# Patient Record
Sex: Female | Born: 1965
Health system: Southern US, Community
[De-identification: ages and names within clinical notes are randomized; demographics above are authoritative.]

## PROBLEM LIST (undated history)

## (undated) DIAGNOSIS — Z803 Family history of malignant neoplasm of breast: Secondary | ICD-10-CM

## (undated) DIAGNOSIS — K219 Gastro-esophageal reflux disease without esophagitis: Secondary | ICD-10-CM

## (undated) DIAGNOSIS — Z8041 Family history of malignant neoplasm of ovary: Secondary | ICD-10-CM

## (undated) DIAGNOSIS — Z8051 Family history of malignant neoplasm of kidney: Secondary | ICD-10-CM

## (undated) DIAGNOSIS — N200 Calculus of kidney: Secondary | ICD-10-CM

## (undated) DIAGNOSIS — Z808 Family history of malignant neoplasm of other organs or systems: Secondary | ICD-10-CM

## (undated) DIAGNOSIS — I10 Essential (primary) hypertension: Secondary | ICD-10-CM

## (undated) DIAGNOSIS — E785 Hyperlipidemia, unspecified: Secondary | ICD-10-CM

## (undated) DIAGNOSIS — E119 Type 2 diabetes mellitus without complications: Secondary | ICD-10-CM

## (undated) DIAGNOSIS — B029 Zoster without complications: Secondary | ICD-10-CM

## (undated) HISTORY — PX: KIDNEY STONE SURGERY: SHX686

## (undated) HISTORY — DX: Family history of malignant neoplasm of breast: Z80.3

## (undated) HISTORY — DX: Family history of malignant neoplasm of ovary: Z80.41

## (undated) HISTORY — DX: Family history of malignant neoplasm of other organs or systems: Z80.8

## (undated) HISTORY — DX: Family history of malignant neoplasm of kidney: Z80.51

---
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
## 2009-11-14 ENCOUNTER — Observation Stay (HOSPITAL_COMMUNITY): Admission: AD | Admit: 2009-11-14 | Discharge: 2009-11-14 | Payer: Self-pay

## 2009-11-27 ENCOUNTER — Emergency Department (HOSPITAL_COMMUNITY): Admission: EM | Admit: 2009-11-27 | Discharge: 2009-11-27 | Payer: Self-pay | Admitting: Emergency Medicine

## 2010-12-09 LAB — CBC
Hemoglobin: 14.2 g/dL (ref 12.0–15.0)
Platelets: 188 10*3/uL (ref 150–400)
RBC: 5 MIL/uL (ref 3.87–5.11)
RDW: 13.8 % (ref 11.5–15.5)

## 2010-12-09 LAB — BASIC METABOLIC PANEL
BUN: 14 mg/dL (ref 6–23)
Chloride: 104 mEq/L (ref 96–112)
Creatinine, Ser: 0.65 mg/dL (ref 0.4–1.2)
GFR calc Af Amer: >60 mL/min (ref >60)

## 2010-12-09 LAB — LIPID PANEL
HDL: 58 mg/dL (ref >39)
Total CHOL/HDL Ratio: 3 RATIO
Triglycerides: 78 mg/dL (ref <150)
VLDL: 16 mg/dL (ref 0–40)

## 2010-12-09 LAB — HEPATIC FUNCTION PANEL
AST: 17 U/L (ref 0–37)
Bilirubin, Direct: 0.2 mg/dL (ref 0.0–0.3)
Total Bilirubin: 1.1 mg/dL (ref 0.3–1.2)

## 2010-12-13 LAB — URINALYSIS, ROUTINE W REFLEX MICROSCOPIC: Specific Gravity, Urine: 1.025 (ref 1.005–1.030)

## 2010-12-13 LAB — COMPREHENSIVE METABOLIC PANEL
ALT: 28 U/L (ref 0–35)
CO2: 29 mEq/L (ref 19–32)
Calcium: 9 mg/dL (ref 8.4–10.5)
Chloride: 106 mEq/L (ref 96–112)
Creatinine, Ser: 0.7 mg/dL (ref 0.4–1.2)
GFR calc non Af Amer: >60 mL/min (ref >60)
Glucose, Bld: 127 mg/dL — ABNORMAL HIGH (ref 70–99)
Total Bilirubin: 1.1 mg/dL (ref 0.3–1.2)
Total Protein: 7.4 g/dL (ref 6.0–8.3)

## 2010-12-13 LAB — DIFFERENTIAL
Basophils Relative: 0 % (ref 0–1)
Lymphs Abs: 1.7 10*3/uL (ref 0.7–4.0)
Monocytes Absolute: 0.6 10*3/uL (ref 0.1–1.0)
Monocytes Relative: 11 % (ref 3–12)
Neutrophils Relative %: 56 % (ref 43–77)

## 2010-12-13 LAB — CBC: HCT: 39.3 % (ref 36.0–46.0)

## 2010-12-13 LAB — LIPASE, BLOOD: Lipase: 27 U/L (ref 11–59)

## 2010-12-13 LAB — POCT PREGNANCY, URINE: Preg Test, Ur: NEGATIVE

## 2010-12-13 LAB — URINE MICROSCOPIC-ADD ON

## 2012-08-04 ENCOUNTER — Encounter (HOSPITAL_COMMUNITY): Payer: Self-pay | Admitting: *Deleted

## 2012-08-04 ENCOUNTER — Emergency Department (HOSPITAL_COMMUNITY): Payer: Self-pay

## 2012-08-04 ENCOUNTER — Emergency Department (HOSPITAL_COMMUNITY)
Admission: EM | Admit: 2012-08-04 | Discharge: 2012-08-04 | Disposition: A | Discharge status: Home / Self Care | Payer: Self-pay | Attending: Emergency Medicine | Admitting: Emergency Medicine

## 2012-08-04 DIAGNOSIS — Z87442 Personal history of urinary calculi: Secondary | ICD-10-CM | POA: Insufficient documentation

## 2012-08-04 DIAGNOSIS — Z8679 Personal history of other diseases of the circulatory system: Secondary | ICD-10-CM

## 2012-08-04 DIAGNOSIS — R109 Unspecified abdominal pain: Secondary | ICD-10-CM | POA: Insufficient documentation

## 2012-08-04 DIAGNOSIS — I10 Essential (primary) hypertension: Secondary | ICD-10-CM | POA: Insufficient documentation

## 2012-08-04 DIAGNOSIS — R12 Heartburn: Secondary | ICD-10-CM | POA: Insufficient documentation

## 2012-08-04 DIAGNOSIS — K219 Gastro-esophageal reflux disease without esophagitis: Secondary | ICD-10-CM | POA: Insufficient documentation

## 2012-08-04 DIAGNOSIS — R1013 Epigastric pain: Secondary | ICD-10-CM | POA: Insufficient documentation

## 2012-08-04 DIAGNOSIS — R300 Dysuria: Secondary | ICD-10-CM | POA: Insufficient documentation

## 2012-08-04 HISTORY — DX: Calculus of kidney: N20.0

## 2012-08-04 HISTORY — DX: Gastro-esophageal reflux disease without esophagitis: K21.9

## 2012-08-04 HISTORY — DX: Essential (primary) hypertension: I10

## 2012-08-04 LAB — URINALYSIS, ROUTINE W REFLEX MICROSCOPIC
Bilirubin Urine: NEGATIVE
Glucose, UA: NEGATIVE mg/dL
Hgb urine dipstick: NEGATIVE
Ketones, ur: NEGATIVE mg/dL
Leukocytes, UA: NEGATIVE
Nitrite: NEGATIVE
Protein, ur: NEGATIVE mg/dL
Specific Gravity, Urine: 1.01 (ref 1.005–1.030)
Urobilinogen, UA: 0.2 mg/dL (ref 0.0–1.0)
pH: 6.5 (ref 5.0–8.0)

## 2012-08-04 LAB — CBC WITH DIFFERENTIAL/PLATELET
Basophils Relative: 0 % (ref 0–1)
Eosinophils Absolute: 0.2 10*3/uL (ref 0.0–0.7)
Lymphs Abs: 2.8 10*3/uL (ref 0.7–4.0)
MCHC: 34 g/dL (ref 30.0–36.0)
Monocytes Absolute: 0.3 10*3/uL (ref 0.1–1.0)
RBC: 5.11 MIL/uL (ref 3.87–5.11)
WBC: 6.5 10*3/uL (ref 4.0–10.5)

## 2012-08-04 LAB — BASIC METABOLIC PANEL
CO2: 25 mEq/L (ref 19–32)
Chloride: 101 mEq/L (ref 96–112)
Glucose, Bld: 113 mg/dL — ABNORMAL HIGH (ref 70–99)
Potassium: 3.7 mEq/L (ref 3.5–5.1)
Sodium: 134 mEq/L — ABNORMAL LOW (ref 135–145)

## 2012-08-04 MED ORDER — FAMOTIDINE IN NACL 20-0.9 MG/50ML-% IV SOLN
20.0000 mg | Freq: Once | INTRAVENOUS | Status: DC
  Filled 2012-08-04: qty 50

## 2012-08-04 MED ORDER — PHENAZOPYRIDINE HCL 200 MG PO TABS: 200.0000 mg | ORAL_TABLET | Freq: Three times a day (TID) | ORAL | Status: DC

## 2012-08-04 MED ORDER — OMEPRAZOLE 40 MG PO CPDR: 40.0000 mg | DELAYED_RELEASE_CAPSULE | Freq: Every day | ORAL | Status: DC

## 2012-08-04 MED ORDER — DIPHENHYDRAMINE HCL 50 MG/ML IJ SOLN
INTRAMUSCULAR | Status: AC
  Filled 2012-08-04: qty 1

## 2012-08-04 MED ORDER — ONDANSETRON HCL 4 MG/2ML IJ SOLN
4.0000 mg | Freq: Once | INTRAMUSCULAR | Status: AC
  Administered 2012-08-04: 4 mg via INTRAVENOUS
  Filled 2012-08-04: qty 2

## 2012-08-04 MED ORDER — DIPHENHYDRAMINE HCL 50 MG/ML IJ SOLN
25.0000 mg | Freq: Once | INTRAMUSCULAR | Status: AC
  Administered 2012-08-04: 25 mg via INTRAVENOUS

## 2012-08-04 MED ORDER — AMLODIPINE BESYLATE 5 MG PO TABS: 5.0000 mg | ORAL_TABLET | Freq: Every day | ORAL | Status: DC

## 2012-08-04 MED ORDER — DIPHENHYDRAMINE HCL 25 MG PO CAPS
25.0000 mg | ORAL_CAPSULE | Freq: Once | ORAL | Status: AC
  Administered 2012-08-04: 25 mg via ORAL
  Filled 2012-08-04: qty 1

## 2012-08-04 MED ORDER — FAMOTIDINE 20 MG PO TABS
20.0000 mg | ORAL_TABLET | Freq: Once | ORAL | Status: AC
  Administered 2012-08-04: 20 mg via ORAL
  Filled 2012-08-04: qty 1

## 2012-08-04 MED ORDER — OMEPRAZOLE 20 MG PO CPDR: 20.0000 mg | DELAYED_RELEASE_CAPSULE | Freq: Two times a day (BID) | ORAL | Status: DC

## 2012-08-04 MED ORDER — MORPHINE SULFATE 4 MG/ML IJ SOLN
4.0000 mg | Freq: Once | INTRAMUSCULAR | Status: AC
  Administered 2012-08-04: 4 mg via INTRAVENOUS
  Filled 2012-08-04: qty 1

## 2012-08-04 NOTE — ED Provider Notes (Signed)
Medical screening examination/treatment/procedure(s) were conducted as a shared visit with non-physician practitioner(s) and myself.  I personally evaluated the patient during the encounter  Flint Melter, MD 08/04/12 2050

## 2012-08-04 NOTE — ED Notes (Signed)
Attempted IV a total of three times without success. Per two different nurses. IV team notified. NAD noted at this time daughters remain at the bedside.

## 2012-08-04 NOTE — ED Provider Notes (Signed)
History     CSN: [id number]  Arrival date & time 08/04/12  1041   First MD Initiated Contact with Patient 08/04/12 1123      Chief Complaint  Patient presents with  . Flank Pain    (Consider location/radiation/quality/duration/timing/severity/associated sxs/prior treatment) HPI  Angela Wilson is a 46 y.o. female complaining of dysuria, frequency and flank pain worsening over the course of several weeks. Pain is worse on the right than the left. Patient has a history of kidney stones. Endorses subjective fever, denies nausea or vomiting. Pain is severe at 8/10. Pt also reports an epigastric pain and sour brash.   Past Medical History  Diagnosis Date  . Hypertension     History reviewed. No pertinent past surgical history.  No family history on file.  History  Substance Use Topics  . Smoking status: Never Smoker   . Smokeless tobacco: Not on file  . Alcohol Use: No    OB History    Grav Para Term Preterm Abortions TAB SAB Ect Mult Living                  Review of Systems  Constitutional: Negative for fever.  Respiratory: Negative for shortness of breath.   Cardiovascular: Negative for chest pain.  Gastrointestinal: Negative for nausea, vomiting, abdominal pain and diarrhea.  Genitourinary: Positive for dysuria and flank pain.  All other systems reviewed and are negative.    Allergies  Penicillins  Home Medications   Current Outpatient Rx  Name  Route  Sig  Dispense  Refill  . AMLODIPINE BESYLATE 5 MG PO TABS   Oral   Take 5 mg by mouth daily.         . IBUPROFEN 200 MG PO TABS   Oral   Take 600 mg by mouth every 6 (six) hours as needed. For pain         . OMEPRAZOLE 40 MG PO CPDR   Oral   Take 40 mg by mouth daily.           BP 132/66  Pulse 98  Temp 98.7 F (37.1 C) (Oral)  Resp 20  SpO2 98%  Physical Exam  Nursing note and vitals reviewed. Constitutional: She is oriented to person, place, and time. She appears well-developed and  well-nourished. No distress.  HENT:  Head: Normocephalic.  Eyes: Conjunctivae normal and EOM are normal.  Cardiovascular: Normal rate.   Pulmonary/Chest: Effort normal and breath sounds normal. No stridor. No respiratory distress. She has no wheezes. She has no rales. She exhibits no tenderness.  Abdominal: Soft. Bowel sounds are normal. She exhibits no distension and no mass. There is no tenderness. There is no rebound and no guarding.  Musculoskeletal: Normal range of motion.  Neurological: She is alert and oriented to person, place, and time.  Psychiatric: She has a normal mood and affect.    ED Course  Procedures (including critical care time)  Labs Reviewed  URINALYSIS, ROUTINE W REFLEX MICROSCOPIC - Abnormal; Notable for the following:    APPearance CLOUDY (*)     All other components within normal limits  BASIC METABOLIC PANEL - Abnormal; Notable for the following:    Sodium 134 (*)     Glucose, Bld 113 (*)     All other components within normal limits  CBC WITH DIFFERENTIAL  URINALYSIS, ROUTINE W REFLEX MICROSCOPIC   No results found.   No diagnosis found.    MDM  VSS, PE unremarkable and PT's UA and  bloodwork are normal.   Shared visit with attending Dr. Effie Shy  CT abd pelvis orderd to r/o nephrolithiasis.   Case Signed out PA KIRICHENKO at shift chang.    Wynetta Emery, PA-C 08/04/12 [phone]

## 2012-08-04 NOTE — ED Notes (Signed)
Patient with bilateral flank pain for about a week but the pain has become worse.  History of same and was told she had kidney stones

## 2012-08-04 NOTE — ED Provider Notes (Signed)
Angela Wilson is a 46 y.o. female who has bilateral flank pain, right greater than left for about one week. She is recently, arrived in the [place]. She has been diagnosed with right renal stones on ultrasound. She showed me the images. She has urinary urgency, and frequency, especially night. She has not seen blood in the urine. There's been no fever or chills. She also has epigastric discomfort that worsens after eating. She takes omeprazole sporadically.  Exam. Mild epigastric tenderness. Mild right flank tenderness. She is alert and calm   Medical screening examination/treatment/procedure(s) were conducted as a shared visit with non-physician practitioner(s) and myself.  I personally evaluated the patient during the encounter    Flint Melter, MD 08/04/12 2049

## 2012-08-04 NOTE — ED Provider Notes (Signed)
5:32 PM Pt moved to CDU holding.  Sign out received from [employer], PA-C.  Patient with flank pain and unremarkable labs.  Plan is for urine pregnancy,  CT abd/pelvis without contrast, concern for possible ureteral stone.  If CT is negative, pt to be d/c home with Rx for GERD.   Pt also had IV infiltrate and has had localized reaction to forearm, will recheck.    6:07 PM Through family member translator, pt states that she is currently pain free.  Also notes that her left forearm reaction from the IV is much improved.    7:20 PM Pt remains pain free.  Left arm continues to improve.  Pt has no complaints at this time.  Discussed all results with patient through family member interpreter.  Pt is visiting this country for several months.  Plan is for d/c home with pyridium (will send urine culture), prilosec, and pt requests refill of her amlodipine.   Pt verbalizes understanding and agrees with plan.   Pt given return precautions.    Results for orders placed during the hospital encounter of 08/04/12  URINALYSIS, ROUTINE W REFLEX MICROSCOPIC      Component Value Range   Color, Urine YELLOW  YELLOW   APPearance CLOUDY (*) CLEAR   Specific Gravity, Urine 1.010  1.005 - 1.030   pH 6.5  5.0 - 8.0   Glucose, UA NEGATIVE  NEGATIVE mg/dL   Hgb urine dipstick NEGATIVE  NEGATIVE   Bilirubin Urine NEGATIVE  NEGATIVE   Ketones, ur NEGATIVE  NEGATIVE mg/dL   Protein, ur NEGATIVE  NEGATIVE mg/dL   Urobilinogen, UA 0.2  0.0 - 1.0 mg/dL   Nitrite NEGATIVE  NEGATIVE   Leukocytes, UA NEGATIVE  NEGATIVE  CBC WITH DIFFERENTIAL      Component Value Range   WBC 6.5  4.0 - 10.5 K/uL   RBC 5.11  3.87 - 5.11 MIL/uL   Hemoglobin 14.0  12.0 - 15.0 g/dL   HCT 16.1  09.6 - 04.5 %   MCV 80.6  78.0 - 100.0 fL   MCH 27.4  26.0 - 34.0 pg   MCHC 34.0  30.0 - 36.0 g/dL   RDW 40.9  81.1 - 91.4 %   Platelets 190  150 - 400 K/uL   Neutrophils Relative 50  43 - 77 %   Neutro Abs 3.2  1.7 - 7.7 K/uL   Lymphocytes  Relative 43  12 - 46 %   Lymphs Abs 2.8  0.7 - 4.0 K/uL   Monocytes Relative 4  3 - 12 %   Monocytes Absolute 0.3  0.1 - 1.0 K/uL   Eosinophils Relative 3  0 - 5 %   Eosinophils Absolute 0.2  0.0 - 0.7 K/uL   Basophils Relative 0  0 - 1 %   Basophils Absolute 0.0  0.0 - 0.1 K/uL  BASIC METABOLIC PANEL      Component Value Range   Sodium 134 (*) 135 - 145 mEq/L   Potassium 3.7  3.5 - 5.1 mEq/L   Chloride 101  96 - 112 mEq/L   CO2 25  19 - 32 mEq/L   Glucose, Bld 113 (*) 70 - 99 mg/dL   BUN 10  6 - 23 mg/dL   Creatinine, Ser 7.82  0.50 - 1.10 mg/dL   Calcium 9.4  8.4 - 95.6 mg/dL   GFR calc non Af Amer >90  >90 mL/min   GFR calc Af Amer >90  >90 mL/min  PREGNANCY,  URINE      Component Value Range   Preg Test, Ur NEGATIVE  NEGATIVE   Ct Abdomen Pelvis Wo Contrast  08/04/2012  *RADIOLOGY REPORT*  Clinical Data: Bilateral flank pain and abdominal pain.  Diarrhea.  CT ABDOMEN AND PELVIS WITHOUT CONTRAST  Technique:  Multidetector CT imaging of the abdomen and pelvis was performed following the standard protocol without intravenous contrast.  Comparison: 11/14/2009.  Findings: Nonobstructing subcentimeter left renal calculi are redemonstrated and similar to priors.  No right renal calculi.  No obstruction.  Clear lung bases.  Hepatic steatosis.  Normal spleen, pancreas, gallbladder, and adrenal glands.  No renal masses. No retroperitoneal adenopathy or vascular anomaly.  Normal appearing stomach, small bowel, and colon.  No appendiceal inflammation. Unremarkable uterus and adnexae.  Bladder normal.  Degenerative change lumbar spine.  IMPRESSION: Nephrolithiasis.  Steatosis.  Lumbar degenerative change.   Original Report Authenticated By: Davonna Belling, M.D.       [place], [place] 08/04/12 2023

## 2012-08-04 NOTE — ED Notes (Signed)
Patient has had a local reaction to the MSO4 she has redness above the IV site and bulging veins Dr. Effie Shy in to see patient, new orders obtained

## 2012-08-04 NOTE — ED Notes (Signed)
Moved to CDU report called to [employer] [facility]

## 2012-08-04 NOTE — ED Notes (Signed)
IV team in to attempt IV

## 2012-08-04 NOTE — ED Notes (Signed)
Family at bedside. 

## 2012-08-04 NOTE — ED Notes (Signed)
Patient is resting comfortably. 

## 2012-08-07 LAB — URINE CULTURE: Colony Count: 80000

## 2012-11-07 ENCOUNTER — Encounter (HOSPITAL_COMMUNITY): Payer: Self-pay

## 2012-11-07 ENCOUNTER — Emergency Department (HOSPITAL_COMMUNITY)
Admission: EM | Admit: 2012-11-07 | Discharge: 2012-11-07 | Disposition: A | Discharge status: Home / Self Care | Payer: Self-pay | Source: Home / Self Care | Attending: Family Medicine | Admitting: Family Medicine

## 2012-11-07 DIAGNOSIS — R109 Unspecified abdominal pain: Secondary | ICD-10-CM

## 2012-11-07 DIAGNOSIS — R7309 Other abnormal glucose: Secondary | ICD-10-CM

## 2012-11-07 DIAGNOSIS — R739 Hyperglycemia, unspecified: Secondary | ICD-10-CM

## 2012-11-07 DIAGNOSIS — K219 Gastro-esophageal reflux disease without esophagitis: Secondary | ICD-10-CM

## 2012-11-07 DIAGNOSIS — N200 Calculus of kidney: Secondary | ICD-10-CM

## 2012-11-07 LAB — POCT URINALYSIS DIP (DEVICE)
Bilirubin Urine: NEGATIVE
Ketones, ur: NEGATIVE mg/dL
Protein, ur: NEGATIVE mg/dL
Specific Gravity, Urine: 1.025 (ref 1.005–1.030)

## 2012-11-07 LAB — CBC WITH DIFFERENTIAL/PLATELET
Basophils Absolute: 0 10*3/uL (ref 0.0–0.1)
HCT: 41.2 % (ref 36.0–46.0)
Hemoglobin: 13.5 g/dL (ref 12.0–15.0)
Lymphocytes Relative: 43 % (ref 12–46)
Monocytes Absolute: 0.3 10*3/uL (ref 0.1–1.0)
Monocytes Relative: 5 % (ref 3–12)
Neutro Abs: 2.6 10*3/uL (ref 1.7–7.7)
Neutrophils Relative %: 47 % (ref 43–77)
RDW: 13.9 % (ref 11.5–15.5)
WBC: 5.5 10*3/uL (ref 4.0–10.5)

## 2012-11-07 LAB — HEMOGLOBIN A1C: Mean Plasma Glucose: 166 mg/dL — ABNORMAL HIGH (ref <117)

## 2012-11-07 LAB — COMPREHENSIVE METABOLIC PANEL
AST: 14 U/L (ref 0–37)
Albumin: 3.7 g/dL (ref 3.5–5.2)
Calcium: 9.1 mg/dL (ref 8.4–10.5)
Creatinine, Ser: 0.66 mg/dL (ref 0.50–1.10)

## 2012-11-07 MED ORDER — OMEPRAZOLE 20 MG PO CPDR: 20.0000 mg | DELAYED_RELEASE_CAPSULE | Freq: Two times a day (BID) | ORAL | Status: DC

## 2012-11-07 MED ORDER — AMLODIPINE BESYLATE 5 MG PO TABS: 5.0000 mg | ORAL_TABLET | Freq: Every day | ORAL | Status: DC

## 2012-11-07 MED ORDER — TRAMADOL HCL 50 MG PO TABS: 50.0000 mg | ORAL_TABLET | Freq: Four times a day (QID) | ORAL | Status: DC | PRN

## 2012-11-07 MED ORDER — IBUPROFEN 600 MG PO TABS: 600.0000 mg | ORAL_TABLET | Freq: Three times a day (TID) | ORAL | Status: DC | PRN

## 2012-11-07 NOTE — ED Notes (Signed)
Patient complains of flank pain to her lower back. Was seen in the ED about a month ago was told she had kidney stones No has frequent urination and some burning

## 2012-11-07 NOTE — ED Provider Notes (Signed)
History   CSN: [id number]  Arrival date & time 11/07/12  1011   First MD Initiated Contact with Patient 11/07/12 1018     Chief Complaint  Patient presents with  . Flank Pain   HPI Patient presents today to discuss her chronic kidney stones.  She was evaluated in the emergency department recently and had a CT scan done that revealed nephrolithiasis.  She was having bilateral flank pain at the time.  She reports that she continues to have bilateral flank pain.  She also has pain with urination.  She denies having blood in her urine.  She reports that she was given some medication in the emergency department but it has not helped her symptoms.  She continues to have pain and discomfort.  She has no medical insurance and she is visiting the country for a couple of months.  She has not seen a urologist.  She was not able to followup with the urologist as suggested by the emergency department because of not having medical insurance.  The patient denies fever and chills.  The patient denies nausea vomiting and diarrhea.  The patient denies abdominal pain.  The patient does report that she has completed the omeprazole for acid reflux.  She reports that the symptoms of acid reflux has started to return.  The patient reports that when she took the medication her symptoms were much better.  Past Medical History  Diagnosis Date  . Hypertension   . Kidney stone   . GERD (gastroesophageal reflux disease)     History reviewed. No pertinent past surgical history.  FAMILY HISTORY No history of hypertension or diabetes or cancer known by patient or heart disease   History  Substance Use Topics  . Smoking status: Never Smoker   . Smokeless tobacco: Not on file  . Alcohol Use: No    OB History   Grav Para Term Preterm Abortions TAB SAB Ect Mult Living                 Review of Systems  Gastrointestinal: Positive for nausea.  Endocrine: Positive for polyuria.  Genitourinary: Positive for dysuria  and flank pain.  All other systems reviewed and are negative.    Allergies  Penicillins  Home Medications   Current Outpatient Rx  Name  Route  Sig  Dispense  Refill  . amLODipine (NORVASC) 5 MG tablet   Oral   Take 1 tablet (5 mg total) by mouth daily.   30 tablet   0   . ibuprofen (ADVIL,MOTRIN) 200 MG tablet   Oral   Take 600 mg by mouth every 6 (six) hours as needed. For pain         . omeprazole (PRILOSEC) 20 MG capsule   Oral   Take 1 capsule (20 mg total) by mouth 2 (two) times daily.   60 capsule   0   . phenazopyridine (PYRIDIUM) 200 MG tablet   Oral   Take 1 tablet (200 mg total) by mouth 3 (three) times daily.   6 tablet   0    BP 131/96  Pulse 76  Temp(Src) 98.2 F (36.8 C) (Oral)  SpO2 99%  Physical Exam  Nursing note and vitals reviewed. Constitutional: She is oriented to person, place, and time. She appears well-developed and well-nourished. No distress.  HENT:  Head: Normocephalic and atraumatic.  Eyes: Conjunctivae and EOM are normal. Pupils are equal, round, and reactive to light.  Neck: Normal range of motion. Neck supple.  Cardiovascular: Normal rate, regular rhythm and normal heart sounds.   Pulmonary/Chest: Effort normal and breath sounds normal.  Abdominal: Soft. Bowel sounds are normal. She exhibits no distension and no mass. There is no tenderness. There is no rebound and no guarding.  Bilateral flank pain, no suprapubic tenderness  Musculoskeletal: Normal range of motion. She exhibits tenderness.  Mild CVA tenderness to palpation  Neurological: She is alert and oriented to person, place, and time. She displays normal reflexes. No cranial nerve deficit. She exhibits normal muscle tone. Coordination normal.  Skin: Skin is warm and dry.  Psychiatric: She has a normal mood and affect. Her behavior is normal. Judgment and thought content normal.    ED Course  Procedures (including critical care time)  Labs Reviewed  COMPREHENSIVE  METABOLIC PANEL  HEMOGLOBIN A1C  CBC WITH DIFFERENTIAL   No results found.   No diagnosis found.  MDM  IMPRESSION  Nephrolithiasis  GERD   Hyperglycemia  Obesity   RECOMMENDATIONS / PLAN Check labs today Pt is applying for an orange discount card, then will be able to refer her to urology for evaluation Check urinalysis Refill omeprazole 20 mg po bid for GERD treatment Tramadol 50 mg prn pain Ibuprofen prn pain Encouraged patient to drink plenty of fluids (low sugar, low caffeine)  FOLLOW UP 2 weeks to follow up on kidney stones  The patient was given clear instructions to go to ER or return to medical center if symptoms don't improve, worsen or new problems develop.  The patient verbalized understanding.  The patient was told to call to get lab results if they haven't heard anything in the next week.            Cleora Fleet, MD 11/07/12 [phone]

## 2012-11-08 ENCOUNTER — Telehealth (HOSPITAL_COMMUNITY): Payer: Self-pay

## 2012-11-08 NOTE — Progress Notes (Signed)
Quick Note:  Please inform patient that her blood sugar was elevated and she does have diabetes. Please mail her information on diabetes, diet and physical activity. I recommend that she start taking Metformin ER 500 mg tabs. Take 1 tab po daily after breakfast. #30, RFx1. Please refer her for diabetes education for new diagnosis of diabetes mellitus. She needs to follow up in clinic in 2 weeks for office visit.   Rodney Langton, MD, CDE, FAAFP [facility] [place], [place]   ______

## 2012-11-08 NOTE — Telephone Encounter (Signed)
Message copied by Lestine Mount on Thu Nov 08, 2012  7:22 PM ------      Message from: Cleora Fleet      Created: Thu Nov 08, 2012  8:23 AM       Please inform patient that her blood sugar was elevated and she does have diabetes.  Please mail her information on diabetes, diet and physical activity.  I recommend that she start taking Metformin ER 500 mg tabs.  Take 1 tab po daily after breakfast.  #30, RFx1.  Please refer her for diabetes education for new diagnosis of diabetes mellitus.  She needs to follow up in clinic in 2 weeks for office visit.              Rodney Langton, MD, CDE, FAAFP      [facility]      [facility]      [place], [place]        ------

## 2012-11-09 DIAGNOSIS — E119 Type 2 diabetes mellitus without complications: Secondary | ICD-10-CM | POA: Insufficient documentation

## 2012-11-09 MED ORDER — METFORMIN HCL ER 500 MG PO TB24: 500.0000 mg | ORAL_TABLET | Freq: Every day | ORAL | Status: DC

## 2012-11-30 ENCOUNTER — Encounter (HOSPITAL_COMMUNITY): Payer: Self-pay

## 2012-11-30 ENCOUNTER — Emergency Department (HOSPITAL_COMMUNITY)
Admission: EM | Admit: 2012-11-30 | Discharge: 2012-11-30 | Disposition: A | Discharge status: Home / Self Care | Payer: No Typology Code available for payment source | Source: Home / Self Care

## 2012-11-30 DIAGNOSIS — E119 Type 2 diabetes mellitus without complications: Secondary | ICD-10-CM

## 2012-11-30 DIAGNOSIS — N12 Tubulo-interstitial nephritis, not specified as acute or chronic: Secondary | ICD-10-CM

## 2012-11-30 MED ORDER — CIPROFLOXACIN HCL 500 MG PO TABS: 500.0000 mg | ORAL_TABLET | Freq: Two times a day (BID) | ORAL | Status: DC

## 2012-11-30 MED ORDER — TRAMADOL HCL 50 MG PO TABS: 50.0000 mg | ORAL_TABLET | Freq: Four times a day (QID) | ORAL | Status: DC | PRN

## 2012-11-30 NOTE — ED Notes (Signed)
Follow up- history of DM and HTn Past three days complains of leg pain Needs referral to kidney specialist

## 2012-11-30 NOTE — ED Provider Notes (Signed)
History     CSN: [id number]  Arrival date & time 11/30/12  1216   None     Chief Complaint  Patient presents with  . Follow-up    (Consider location/radiation/quality/duration/timing/severity/associated sxs/prior treatment) HPI Patient is 47 year old female who presents with main concern of right flank pain that initially started 1 week prior to this visit and has been getting worse. Patient describes pain as constant and dull, occasionally sharp, 7/10 in severity when present, associated with nausea and vomiting, poor oral intake. Pain is nonradiating and there is no specific alleviating factor. Patient was diagnosed with right sided kidney stone but she is not sure of the details. She denies other associated symptoms but explains she thinks she has subjective fevers and chills occasionally. Past Medical History  Diagnosis Date  . Hypertension   . Kidney stone   . GERD (gastroesophageal reflux disease)     History reviewed. No pertinent past surgical history.  No past medical history in family member  History  Substance Use Topics  . Smoking status: Never Smoker   . Smokeless tobacco: Not on file  . Alcohol Use: No    OB History   Grav Para Term Preterm Abortions TAB SAB Ect Mult Living                  Review of Systems Review of Systems  Constitutional: Negative for fever, chills, diaphoresis, activity change, appetite change and fatigue.  HENT: Negative for ear pain, nosebleeds, congestion, facial swelling, rhinorrhea, neck pain, neck stiffness and ear discharge.   Eyes: Negative for pain, discharge, redness, itching and visual disturbance.  Respiratory: Negative for cough, choking, chest tightness, shortness of breath, wheezing and stridor.   Cardiovascular: Negative for chest pain, palpitations and leg swelling.  Gastrointestinal: Negative for abdominal distention.  Genitourinary: Negative for dysuria, urgency, frequency, hematuria, positive for flank  pain Musculoskeletal: Negative for back pain, joint swelling, arthralgias and gait problem.  Neurological: Negative for dizziness, tremors, seizures, syncope, facial asymmetry, speech difficulty, weakness, light-headedness, numbness and headaches.  Hematological: Negative for adenopathy. Does not bruise/bleed easily.  Psychiatric/Behavioral: Negative for hallucinations, behavioral problems, confusion, dysphoric mood, decreased concentration and agitation.    Allergies  Penicillins  Home Medications   Current Outpatient Rx  Name  Route  Sig  Dispense  Refill  . amLODipine (NORVASC) 5 MG tablet   Oral   Take 1 tablet (5 mg total) by mouth daily.   30 tablet   2   . ciprofloxacin (CIPRO) 500 MG tablet   Oral   Take 1 tablet (500 mg total) by mouth 2 (two) times daily.   28 tablet   0   . ibuprofen (ADVIL,MOTRIN) 600 MG tablet   Oral   Take 1 tablet (600 mg total) by mouth every 8 (eight) hours as needed for pain (take with food on stomach). For pain   20 tablet   0   . metFORMIN (GLUCOPHAGE XR) 500 MG 24 hr tablet   Oral   Take 1 tablet (500 mg total) by mouth daily with breakfast.   30 tablet   1   . omeprazole (PRILOSEC) 20 MG capsule   Oral   Take 1 capsule (20 mg total) by mouth 2 (two) times daily.   60 capsule   2   . phenazopyridine (PYRIDIUM) 200 MG tablet   Oral   Take 1 tablet (200 mg total) by mouth 3 (three) times daily.   6 tablet   0   .  traMADol (ULTRAM) 50 MG tablet   Oral   Take 1 tablet (50 mg total) by mouth every 6 (six) hours as needed for pain (take for severe pain).   45 tablet   0     BP 115/55  Pulse 77  Temp(Src) 97.9 F (36.6 C) (Oral)  SpO2 100%  Physical Exam Physical Exam  Constitutional: Appears well-developed and well-nourished. No distress.  HENT: Normocephalic. External right and left ear normal. Oropharynx is clear and moist.  Eyes: Conjunctivae and EOM are normal. PERRLA, no scleral icterus.  Neck: Normal ROM. Neck  supple. No JVD. No tracheal deviation. No thyromegaly.  CVS: RRR, S1/S2 +, no murmurs, no gallops, no carotid bruit.  Pulmonary: Effort and breath sounds normal, no stridor, rhonchi, wheezes, rales.  Abdominal: Soft. BS +,  no distension, tenderness, rebound or guarding.  significant tenderness in right flank area  Musculoskeletal: Normal range of motion. No edema and no tenderness.  Lymphadenopathy: No lymphadenopathy noted, cervical, inguinal. Neuro: Alert. Normal reflexes, muscle tone coordination. No cranial nerve deficit. Skin: Skin is warm and dry. No rash noted. Not diaphoretic. No erythema. No pallor.  Psychiatric: Normal mood and affect. Behavior, judgment, thought content normal.    ED Course  Procedures (including critical care time)  Labs Reviewed - No data to display No results found.   1. Type 2 diabetes mellitus   2. Pyelonephritis    - Patient reports taking diabetic medications as prescribed - Her symptoms and physical exam findings are most consistent with pyelonephritis  - we will treat with course of ciprofloxacin for 2 weeks, patient insistent on urology referral, will place referral order - Asian advised to go to emergency department if her symptoms get worse   MDM  Pyelonephritis        Dorothea Ogle, MD 11/30/12 1304

## 2012-11-30 NOTE — ED Notes (Signed)
Referral faxed to [facility] waiting for an appt 

## 2012-12-20 ENCOUNTER — Encounter: Payer: Self-pay | Admitting: Family Medicine

## 2012-12-20 DIAGNOSIS — N200 Calculus of kidney: Secondary | ICD-10-CM | POA: Insufficient documentation

## 2012-12-20 DIAGNOSIS — K219 Gastro-esophageal reflux disease without esophagitis: Secondary | ICD-10-CM | POA: Insufficient documentation

## 2015-05-05 ENCOUNTER — Emergency Department (HOSPITAL_COMMUNITY): Payer: Self-pay

## 2015-05-05 ENCOUNTER — Encounter (HOSPITAL_COMMUNITY): Payer: Self-pay | Admitting: *Deleted

## 2015-05-05 ENCOUNTER — Emergency Department (HOSPITAL_COMMUNITY)
Admission: EM | Admit: 2015-05-05 | Discharge: 2015-05-06 | Disposition: A | Discharge status: Home / Self Care | Payer: Self-pay | Attending: Emergency Medicine | Admitting: Emergency Medicine

## 2015-05-05 DIAGNOSIS — Z87442 Personal history of urinary calculi: Secondary | ICD-10-CM | POA: Insufficient documentation

## 2015-05-05 DIAGNOSIS — R079 Chest pain, unspecified: Secondary | ICD-10-CM | POA: Insufficient documentation

## 2015-05-05 DIAGNOSIS — I10 Essential (primary) hypertension: Secondary | ICD-10-CM | POA: Insufficient documentation

## 2015-05-05 DIAGNOSIS — Z880 Allergy status to penicillin: Secondary | ICD-10-CM | POA: Insufficient documentation

## 2015-05-05 DIAGNOSIS — Z79899 Other long term (current) drug therapy: Secondary | ICD-10-CM | POA: Insufficient documentation

## 2015-05-05 DIAGNOSIS — M5442 Lumbago with sciatica, left side: Secondary | ICD-10-CM | POA: Insufficient documentation

## 2015-05-05 DIAGNOSIS — Z7982 Long term (current) use of aspirin: Secondary | ICD-10-CM | POA: Insufficient documentation

## 2015-05-05 DIAGNOSIS — E119 Type 2 diabetes mellitus without complications: Secondary | ICD-10-CM | POA: Insufficient documentation

## 2015-05-05 DIAGNOSIS — K219 Gastro-esophageal reflux disease without esophagitis: Secondary | ICD-10-CM | POA: Insufficient documentation

## 2015-05-05 HISTORY — DX: Type 2 diabetes mellitus without complications: E11.9

## 2015-05-05 NOTE — ED Notes (Signed)
Pt c/o left sided dull chest pain that comes and goes over the last 3 days; pt denies shortness of breath; pt c/o back pain radiating down left leg; pt states that the left leg feels heavy; pt states that she has had this problem before but that it got bad again yesterday; pt denies injury

## 2015-05-06 LAB — BASIC METABOLIC PANEL
ANION GAP: 9 (ref 5–15)
BUN: 15 mg/dL (ref 6–20)
CALCIUM: 9.3 mg/dL (ref 8.9–10.3)
CO2: 25 mmol/L (ref 22–32)
Chloride: 104 mmol/L (ref 101–111)
Creatinine, Ser: 0.65 mg/dL (ref 0.44–1.00)
Glucose, Bld: 144 mg/dL — ABNORMAL HIGH (ref 65–99)
Potassium: 3.7 mmol/L (ref 3.5–5.1)
Sodium: 138 mmol/L (ref 135–145)

## 2015-05-06 LAB — I-STAT TROPONIN, ED
TROPONIN I, POC: 0 ng/mL (ref 0.00–0.08)
Troponin i, poc: 0 ng/mL (ref 0.00–0.08)

## 2015-05-06 LAB — CBC
HCT: 42 % (ref 36.0–46.0)
HEMOGLOBIN: 13.6 g/dL (ref 12.0–15.0)
MCH: 26.3 pg (ref 26.0–34.0)
MCHC: 32.4 g/dL (ref 30.0–36.0)
MCV: 81.1 fL (ref 78.0–100.0)
Platelets: 232 10*3/uL (ref 150–400)
RBC: 5.18 MIL/uL — AB (ref 3.87–5.11)
RDW: 13.4 % (ref 11.5–15.5)
WBC: 7.1 10*3/uL (ref 4.0–10.5)

## 2015-05-06 MED ORDER — METHOCARBAMOL 500 MG PO TABS
500.0000 mg | ORAL_TABLET | Freq: Once | ORAL | Status: AC
Start: 2015-05-06 — End: 2015-05-06
  Administered 2015-05-06: 500 mg via ORAL
  Filled 2015-05-06: qty 1

## 2015-05-06 MED ORDER — HYDROCODONE-ACETAMINOPHEN 5-325 MG PO TABS
2.0000 | ORAL_TABLET | Freq: Once | ORAL | Status: AC
  Administered 2015-05-06: 2 via ORAL
  Filled 2015-05-06: qty 2

## 2015-05-06 MED ORDER — METHOCARBAMOL 500 MG PO TABS: 500.0000 mg | ORAL_TABLET | Freq: Two times a day (BID) | ORAL | Status: DC

## 2015-05-06 MED ORDER — HYDROCODONE-ACETAMINOPHEN 5-325 MG PO TABS: 2.0000 | ORAL_TABLET | ORAL | Status: DC | PRN

## 2015-05-06 MED ORDER — TRAMADOL HCL 50 MG PO TABS
50.0000 mg | ORAL_TABLET | Freq: Once | ORAL | Status: AC
  Administered 2015-05-06: 50 mg via ORAL
  Filled 2015-05-06: qty 1

## 2015-05-06 NOTE — Discharge Instructions (Signed)
Alternate ice and heat to your back 3-4 times per day for 15-20 minutes each time. Norco and Robaxin as prescribed for symptoms. Follow-up with a primary care doctor for recheck of your pain. If you do not have a primary care provider, referred to the resource guide below. Return to the emergency department as needed if symptoms worsen.  Sciatica Sciatica is pain, weakness, numbness, or tingling along the path of the sciatic nerve. The nerve starts in the lower back and runs down the back of each leg. The nerve controls the muscles in the lower leg and in the back of the knee, while also providing sensation to the back of the thigh, lower leg, and the sole of your foot. Sciatica is a symptom of another medical condition. For instance, nerve damage or certain conditions, such as a herniated disk or bone spur on the spine, pinch or put pressure on the sciatic nerve. This causes the pain, weakness, or other sensations normally associated with sciatica. Generally, sciatica only affects one side of the body. CAUSES   Herniated or slipped disc.  Degenerative disk disease.  A pain disorder involving the narrow muscle in the buttocks (piriformis syndrome).  Pelvic injury or fracture.  Pregnancy.  Tumor (rare). SYMPTOMS  Symptoms can vary from mild to very severe. The symptoms usually travel from the low back to the buttocks and down the back of the leg. Symptoms can include:  Mild tingling or dull aches in the lower back, leg, or hip.  Numbness in the back of the calf or sole of the foot.  Burning sensations in the lower back, leg, or hip.  Sharp pains in the lower back, leg, or hip.  Leg weakness.  Severe back pain inhibiting movement. These symptoms may get worse with coughing, sneezing, laughing, or prolonged sitting or standing. Also, being overweight may worsen symptoms. DIAGNOSIS  Your caregiver will perform a physical exam to look for common symptoms of sciatica. He or she may ask you  to do certain movements or activities that would trigger sciatic nerve pain. Other tests may be performed to find the cause of the sciatica. These may include:  Blood tests.  X-rays.  Imaging tests, such as an MRI or CT scan. TREATMENT  Treatment is directed at the cause of the sciatic pain. Sometimes, treatment is not necessary and the pain and discomfort goes away on its own. If treatment is needed, your caregiver may suggest:  Over-the-counter medicines to relieve pain.  Prescription medicines, such as anti-inflammatory medicine, muscle relaxants, or narcotics.  Applying heat or ice to the painful area.  Steroid injections to lessen pain, irritation, and inflammation around the nerve.  Reducing activity during periods of pain.  Exercising and stretching to strengthen your abdomen and improve flexibility of your spine. Your caregiver may suggest losing weight if the extra weight makes the back pain worse.  Physical therapy.  Surgery to eliminate what is pressing or pinching the nerve, such as a bone spur or part of a herniated disk. HOME CARE INSTRUCTIONS   Only take over-the-counter or prescription medicines for pain or discomfort as directed by your caregiver.  Apply ice to the affected area for 20 minutes, 3-4 times a day for the first 48-72 hours. Then try heat in the same way.  Exercise, stretch, or perform your usual activities if these do not aggravate your pain.  Attend physical therapy sessions as directed by your caregiver.  Keep all follow-up appointments as directed by your caregiver.  Do not wear high heels or shoes that do not provide proper support.  Check your mattress to see if it is too soft. A firm mattress may lessen your pain and discomfort. SEEK IMMEDIATE MEDICAL CARE IF:   You lose control of your bowel or bladder (incontinence).  You have increasing weakness in the lower back, pelvis, buttocks, or legs.  You have redness or swelling of your  back.  You have a burning sensation when you urinate.  You have pain that gets worse when you lie down or awakens you at night.  Your pain is worse than you have experienced in the past.  Your pain is lasting longer than 4 weeks.  You are suddenly losing weight without reason. MAKE SURE YOU:  Understand these instructions.  Will watch your condition.  Will get help right away if you are not doing well or get worse. Document Released: 08/30/2001 Document Revised: 03/06/2012 Document Reviewed: 01/15/2012 [facility] Patient Information 2015 [place], [place]. This information is not intended to replace advice given to you by your health care provider. Make sure you discuss any questions you have with your health care provider.  Chest Pain (Nonspecific) It is often hard to give a specific diagnosis for the cause of chest pain. There is always a chance that your pain could be related to something serious, such as a heart attack or a blood clot in the lungs. You need to follow up with your health care provider for further evaluation. CAUSES   Heartburn.  Pneumonia or bronchitis.  Anxiety or stress.  Inflammation around your heart (pericarditis) or lung (pleuritis or pleurisy).  A blood clot in the lung.  A collapsed lung (pneumothorax). It can develop suddenly on its own (spontaneous pneumothorax) or from trauma to the chest.  Shingles infection (herpes zoster virus). The chest wall is composed of bones, muscles, and cartilage. Any of these can be the source of the pain.  The bones can be bruised by injury.  The muscles or cartilage can be strained by coughing or overwork.  The cartilage can be affected by inflammation and become sore (costochondritis). DIAGNOSIS  Lab tests or other studies may be needed to find the cause of your pain. Your health care provider may have you take a test called an ambulatory electrocardiogram (ECG). An ECG records your heartbeat patterns over a  24-hour period. You may also have other tests, such as:  Transthoracic echocardiogram (TTE). During echocardiography, sound waves are used to evaluate how blood flows through your heart.  Transesophageal echocardiogram (TEE).  Cardiac monitoring. This allows your health care provider to monitor your heart rate and rhythm in real time.  Holter monitor. This is a portable device that records your heartbeat and can help diagnose heart arrhythmias. It allows your health care provider to track your heart activity for several days, if needed.  Stress tests by exercise or by giving medicine that makes the heart beat faster. TREATMENT   Treatment depends on what may be causing your chest pain. Treatment may include:  Acid blockers for heartburn.  Anti-inflammatory medicine.  Pain medicine for inflammatory conditions.  Antibiotics if an infection is present.  You may be advised to change lifestyle habits. This includes stopping smoking and avoiding alcohol, caffeine, and chocolate.  You may be advised to keep your head raised (elevated) when sleeping. This reduces the chance of acid going backward from your stomach into your esophagus. Most of the time, nonspecific chest pain will improve within 2-3 days with  rest and mild pain medicine.  HOME CARE INSTRUCTIONS   If antibiotics were prescribed, take them as directed. Finish them even if you start to feel better.  For the next few days, avoid physical activities that bring on chest pain. Continue physical activities as directed.  Do not use any tobacco products, including cigarettes, chewing tobacco, or electronic cigarettes.  Avoid drinking alcohol.  Only take medicine as directed by your health care provider.  Follow your health care provider's suggestions for further testing if your chest pain does not go away.  Keep any follow-up appointments you made. If you do not go to an appointment, you could develop lasting (chronic) problems  with pain. If there is any problem keeping an appointment, call to reschedule. SEEK MEDICAL CARE IF:   Your chest pain does not go away, even after treatment.  You have a rash with blisters on your chest.  You have a fever. SEEK IMMEDIATE MEDICAL CARE IF:   You have increased chest pain or pain that spreads to your arm, neck, jaw, back, or abdomen.  You have shortness of breath.  You have an increasing cough, or you cough up blood.  You have severe back or abdominal pain.  You feel nauseous or vomit.  You have severe weakness.  You faint.  You have chills. This is an emergency. Do not wait to see if the pain will go away. Get medical help at once. Call your local emergency services (911 in [place] Do not drive yourself to the hospital. MAKE SURE YOU:   Understand these instructions.  Will watch your condition.  Will get help right away if you are not doing well or get worse. Document Released: 06/15/2005 Document Revised: 09/10/2013 Document Reviewed: 04/10/2008 [facility] Patient Information 2015 [place], [place]. This information is not intended to replace advice given to you by your health care provider. Make sure you discuss any questions you have with your health care provider.  Emergency Department Resource Guide 1) Find a Doctor and Pay Out of Pocket Although you won't have to find out who is covered by your insurance plan, it is a good idea to ask around and get recommendations. You will then need to call the office and see if the doctor you have chosen will accept you as a new patient and what types of options they offer for patients who are self-pay. Some doctors offer discounts or will set up payment plans for their patients who do not have insurance, but you will need to ask so you aren't surprised when you get to your appointment.  2) Contact Your Local Health Department Not all health departments have doctors that can see patients for sick visits, but many do, so it  is worth a call to see if yours does. If you don't know where your local health department is, you can check in your phone book. The CDC also has a tool to help you locate your state's health department, and many state websites also have listings of all of their local health departments.  3) Find a [facility] If your illness is not likely to be very severe or complicated, you may want to try a walk in clinic. These are popping up all over the country in pharmacies, drugstores, and shopping centers. They're usually staffed by nurse practitioners or physician assistants that have been trained to treat common illnesses and complaints. They're usually fairly quick and inexpensive. However, if you have serious medical issues or chronic medical problems, these  are probably not your best option.  No Primary Care Doctor: - Call Health Connect at  [phone] - they can help you locate a primary care doctor that  accepts your insurance, provides certain services, etc. - Physician Referral Service- [phone]  Chronic Pain Problems: Organization         Address  Phone   Notes  [facility]  [phone] Patients need to be referred by their primary care doctor.   Medication Assistance: Organization         Address  Phone   Notes  [facility] Medication [facility] [address] [place], [place] [address] [phone] --Must be a resident of [facility] -- Must have NO insurance coverage whatsoever (no Medicaid/ Medicare, etc.) -- The pt. MUST have a primary care doctor that directs their care regularly and follows them in the community   [facility]  [phone]   [employer]  [phone]    Agencies that provide inexpensive medical care: Organization         Address  Phone   Notes  [place]  [phone]   [facility]    [phone]   [facility] [place], [place] [address] [phone]   [place] [address] [place] [phone]   [facility]anned [facility]    [phone]   [facility]    [phone]   [place] and [address], [place] Phone:  [phone], Fax:  [phone] Hours of Operation:  9 am - 6 pm, M-F.  Also accepts Medicaid/Medicare and self-pay.  [facility] for [place], [address], [place] Phone: [phone], Fax: [phone]. Hours of Operation:  8:30 am - 5:30 pm, M-F.  Also accepts Medicaid and self-pay.  [facility] [place] [address], [place] Phone: [phone]   [place], [place], [place] [phone], Ext. [phone] Mondays & Thursdays: 7-9 AM.  First 15 patients are seen on a first come, first serve basis.    [place] Providers:  Organization         Address  Phone   Notes  [facility] [address], [place] [phone] Also accepts self-pay patients.  [facility] [address], [place]  [phone]   [place], [address], [place] [phone]   [facility] [address], [place] [phone]   Lucianne Lei [address], [place]   [phone] Only accepts [place] patients after they have their name applied to their card.   Self-Pay (no insurance) in [facility]:  Organization         Address  Phone   Notes  Sickle Cell Patients, [facility] [place] [phone]   [facility] [place] [phone]   [facility] [place]  [place], [place], [place] [phone]   [facility]/Dr. Osei-Bonsu  [address], [place] or  [address], [place] [phone] Phone number for both [place] and [place] locations is the same.  [facility] [address] [place] [phone]   [facility] [address], [place] or [address] [phone]   [facility] [address], [place] [phone], phone; [phone], fax Sees patients 1st and 3rd Saturday of every month.  Must not qualify for public or private insurance (i.e. Medicaid, Medicare, [place] Health Choice, Veterans' Benefits)  Household income should be no more than 200% of the poverty level The clinic cannot treat you if you are pregnant or think you are pregnant  Sexually transmitted diseases are not treated at the clinic.    Dental Care: Organization         Address  Phone  Notes  [facility] Department of [facility] [place] [phone] Accepts children up to age 48 who are enrolled in [place] or [place]; pregnant women with a Medicaid card; and children who have applied for Medicaid or [place] Health Choice, but were declined, whose parents can pay a reduced fee at time of service.  [facility] Department of [facility]  [address], [place] [phone] Accepts children up to age 75 who are enrolled in [place] or [place]; pregnant women with a Medicaid card; and children who have applied for Medicaid or [place] Health Choice, but were declined, whose parents can pay a reduced fee at time of service.  [place] [facility]  [place] [phone] Patients are seen by appointment only. Walk-ins are not accepted. [place] will see patients 77 years of age and older. Monday - Tuesday (8am-5pm) Most Wednesdays (8:30-5pm) $30 per visit, cash only  [facility] Adult Dental Access PROGRAM  [address], [facility] [place] [phone] Patients are seen by appointment only. Walk-ins are not accepted. [place] will see patients 56 years of age and older. One Wednesday Evening (Monthly: Volunteer Based).  $30 per visit, cash only  [place]  [phone] for adults; Children under age 33, call Graduate Pediatric Dentistry at [phone]. Children aged 58-14, please call [phone] to request a pediatric application.  Dental services are provided in all areas of dental care including fillings, crowns and bridges, complete and partial dentures, implants, gum treatment, root canals, and extractions. Preventive care is also provided. Treatment is provided to both adults and children. Patients are selected via a lottery and there is often a waiting list.   [facility] [address], [place]  [phone] [email or web address]   [facility] [address] [place], [place] [phone], Ext. [phone] Second and Fourth Thursday of each month, opens at 6:30 AM; Clinic ends at 9 AM.  Patients are seen on a first-come first-served basis, and a limited number are seen during each clinic.   [facility]  [address] [place] Danker [place], [place] [phone]   Eligibility Requirements You must have lived in [place], [place], or [place] counties for at least the last three months.   You cannot be eligible for state or federal sponsored [employer], including [employer], [place], or [employer].   You generally cannot be eligible for healthcare insurance through your employer.    How to apply: Eligibility screenings are held every Tuesday and Wednesday afternoon from 1:00 pm until 4:00 pm. You do not need an appointment for the interview!  [facility]  [address], [place], [place]   [place] Department  [place]  [place]  [phone]

## 2015-05-06 NOTE — ED Notes (Signed)
Called Pharmacy to get dose of Robaxin sent , not coming up in med pyxis

## 2015-05-06 NOTE — ED Provider Notes (Signed)
CSN: [id number]     Arrival date & time 05/05/15  2223 History   First MD Initiated Contact with Patient 05/06/15 0221     Chief Complaint  Patient presents with  . Chest Pain  . Back Pain    (Consider location/radiation/quality/duration/timing/severity/associated sxs/prior Treatment) HPI Comments: 49 year old female with a history of hypertension, esophageal reflux, diabetes mellitus, and kidney stones presents to the emergency department for evaluation of low back pain 4 days. Pain has been constant and worsening. Daughter states that it has been mildly relieved with ibuprofen, but worsens when this medication wears off. Patient has been experiencing pain radiating down her left leg. This is worse with movement and ambulation. Patient states in triage that her left leg feels heavy. She has had similar pain in the past, but it worsened yesterday. Patient is also complaining of a dull, left-sided chest pain which was present 2 days ago as well as yesterday. Patient denies any chest pain at present. Daughter reports that patient complained of some left arm tingling associated with her chest pain. No associated shortness of breath, syncope, extremity weakness, or fever. No nausea or vomiting. No family history or personal history of ACS. Patient has been compliant with her blood pressure and diabetes medications.  Patient is a 49 y.o. female presenting with chest pain and back pain. The history is provided by the patient and a relative. A language interpreter was used (Daughter translating at bedside).  Chest Pain Associated symptoms: back pain   Associated symptoms: no abdominal pain, no fever, no numbness, no shortness of breath and no weakness   Back Pain Associated symptoms: chest pain   Associated symptoms: no abdominal pain, no fever, no numbness and no weakness     Past Medical History  Diagnosis Date  . Hypertension   . Kidney stone   . GERD (gastroesophageal reflux disease)   .  Diabetes mellitus without complication    Past Surgical History  Procedure Laterality Date  . Cesarean section      x 4  . Kidney stone surgery     No family history on file. Social History  Substance Use Topics  . Smoking status: Never Smoker   . Smokeless tobacco: None  . Alcohol Use: No   OB History    No data available      Review of Systems  Constitutional: Negative for fever.  Respiratory: Negative for shortness of breath.   Cardiovascular: Positive for chest pain.  Gastrointestinal: Negative for abdominal pain.  Musculoskeletal: Positive for back pain.  Neurological: Negative for syncope, weakness and numbness.  All other systems reviewed and are negative.   Allergies  Morphine and related and Penicillins  Home Medications   Prior to Admission medications   Medication Sig Start Date End Date Taking? Authorizing Provider  amLODipine (NORVASC) 5 MG tablet Take 1 tablet (5 mg total) by mouth daily. 11/07/12  Yes Clanford Marisa Hua, MD  aspirin 81 MG chewable tablet Chew 81 mg by mouth daily.   Yes Historical Provider, MD  ibuprofen (ADVIL,MOTRIN) 600 MG tablet Take 1 tablet (600 mg total) by mouth every 8 (eight) hours as needed for pain (take with food on stomach). For pain 11/07/12  Yes Clanford Marisa Hua, MD  metFORMIN (GLUCOPHAGE XR) 500 MG 24 hr tablet Take 1 tablet (500 mg total) by mouth daily with breakfast. 11/09/12  Yes Clanford Marisa Hua, MD  omeprazole (PRILOSEC) 20 MG capsule Take 1 capsule (20 mg total) by mouth 2 (two) times  daily. 11/07/12  Yes Clanford L Johnson, MD   BP 147/70 mmHg  Pulse 98  Temp(Src) 98.8 F (37.1 C) (Oral)  Resp 20  SpO2 96%   Physical Exam  Constitutional: She is oriented to person, place, and time. She appears well-developed and well-nourished. No distress.  Pleasant female, in no distress  HENT:  Head: Normocephalic and atraumatic.  Eyes: Conjunctivae and EOM are normal. No scleral icterus.  Neck: Normal range of motion.   No JVD  Cardiovascular: Normal rate, regular rhythm and intact distal pulses.   Pulmonary/Chest: Effort normal and breath sounds normal. No respiratory distress. She has no wheezes. She has no rales.  Lungs CTAB. Respirations even and unlabored  Abdominal: Soft. She exhibits no distension. There is no tenderness. There is no rebound.  Soft, morbidly obese abdomen without TTP  Musculoskeletal: Normal range of motion.  TTP to b/l lumbar paraspinal muscles. No lumbar midline TTP, bony deformities, step offs, or crepitus. Positive straight leg raise, eliciting pain in the L low back  Neurological: She is alert and oriented to person, place, and time. She exhibits normal muscle tone. Coordination normal.  Sensation to light touch intact. Patient moving all extremities.  Skin: Skin is warm and dry. No rash noted. She is not diaphoretic. No erythema. No pallor.  Psychiatric: She has a normal mood and affect. Her behavior is normal.  Nursing note and vitals reviewed.   ED Course  Procedures (including critical care time) Labs Review Labs Reviewed  BASIC METABOLIC PANEL - Abnormal; Notable for the following:    Glucose, Bld 144 (*)    All other components within normal limits  CBC - Abnormal; Notable for the following:    RBC 5.18 (*)    All other components within normal limits  I-STAT TROPOININ, ED  Randolm Idol, ED    Imaging Review Dg Chest 2 View  05/05/2015   CLINICAL DATA:  Chest pain  EXAM: CHEST  2 VIEW  COMPARISON:  None.  FINDINGS: Hypoventilation. There is no edema, consolidation, effusion, or pneumothorax. Normal heart size and aortic contours for technique. No osseous findings to explain chest pain.  IMPRESSION: 1. No acute finding. 2. Hypoventilation.   Electronically Signed   By: Monte Fantasia M.D.   On: 05/05/2015 23:17   I have personally reviewed and evaluated these images and lab results as part of my medical decision-making.   ED ECG REPORT   Date:  05/06/2015  Rate: 87  Rhythm: normal sinus rhythm  QRS Axis: normal  Intervals: PR shortened (125ms)  ST/T Wave abnormalities: nonspecific T wave changes  Conduction Disutrbances:none  Narrative Interpretation: NSR with borderline short PR interval; No STEMI  Old EKG Reviewed: none available  I have personally reviewed the EKG tracing and agree with the computerized printout as noted.   MDM   Final diagnoses:  Bilateral low back pain with left-sided sciatica  Chest pain, unspecified chest pain type    Patient with back pain. She is neurovascularly intact. Patient can walk but states is painful. No loss of bowel or bladder control. No concern for cauda equina. No fever or hx of CA. Symptoms c/w low back pain with L sided sciatica. RICE protocol and pain medicine indicated and discussed with patient. Will initiate course of Norco and Robaxin and refer to PCP and chiropractic medicine.  Patient also with c/o intermittent chest pain. This is NOT associated with SOB, lightheadedness, or syncope. Patient has no pain at present. Work up today is noncontributory.  Symptoms atypical for ACS. Also doubt dissection or PE. Question MSK etiology vs reflux as patient has a hx of GERD. Patient to f/u with her PCP for further evaluation of her chest pain.  No indication for further emergent workup at this time. Patient stable for discharge instruction to return if symptoms worsen. Return precautions provided at discharge and patient agreeable to plan. Patient discharged in good condition with no unaddressed concerns.    Filed Vitals:   05/05/15 2240 05/06/15 0103 05/06/15 0331 05/06/15 0500  BP: 141/87 147/70 133/62 108/59  Pulse: 97 98 80 72  Temp: 98.8 F (37.1 C)  97.9 F (36.6 C)   TempSrc: Oral  Oral   Resp: 20 20 18    Height:   5\' 4"  (1.626 m)   Weight:   244 lb (110.678 kg)   SpO2: 98% 96% 96% 96%     Antonietta Breach, PA-C 05/06/15 3748  Shanon Rosser, MD 05/06/15 [phone]

## 2015-12-05 LAB — POCT LIPID PANEL
HDL: 63
LDL: 91
TC: 190
TRG: 182

## 2015-12-05 LAB — GLUCOSE, POCT (MANUAL RESULT ENTRY): POC GLUCOSE: 116 mg/dL — AB (ref 70–99)

## 2015-12-15 ENCOUNTER — Ambulatory Visit: Payer: Self-pay | Admitting: Family Medicine

## 2015-12-25 ENCOUNTER — Ambulatory Visit: Payer: Self-pay | Admitting: Family Medicine

## 2016-01-11 ENCOUNTER — Ambulatory Visit: Payer: Self-pay | Admitting: Family Medicine

## 2016-01-19 ENCOUNTER — Ambulatory Visit: Payer: Self-pay

## 2016-01-26 ENCOUNTER — Ambulatory Visit: Payer: Self-pay | Attending: Internal Medicine

## 2016-01-27 ENCOUNTER — Other Ambulatory Visit (HOSPITAL_COMMUNITY): Payer: Self-pay | Admitting: Orthopaedic Surgery

## 2016-01-27 DIAGNOSIS — M48061 Spinal stenosis, lumbar region without neurogenic claudication: Secondary | ICD-10-CM

## 2016-02-02 ENCOUNTER — Ambulatory Visit: Payer: Self-pay | Admitting: Family Medicine

## 2016-02-16 ENCOUNTER — Ambulatory Visit: Payer: Self-pay

## 2016-02-17 ENCOUNTER — Ambulatory Visit (HOSPITAL_COMMUNITY): Payer: Self-pay

## 2016-04-01 ENCOUNTER — Ambulatory Visit (HOSPITAL_COMMUNITY)
Admission: EM | Admit: 2016-04-01 | Discharge: 2016-04-01 | Disposition: A | Discharge status: Home / Self Care | Payer: Self-pay | Attending: Emergency Medicine | Admitting: Emergency Medicine

## 2016-04-01 ENCOUNTER — Encounter (HOSPITAL_COMMUNITY): Payer: Self-pay | Admitting: Family Medicine

## 2016-04-01 DIAGNOSIS — J302 Other seasonal allergic rhinitis: Secondary | ICD-10-CM

## 2016-04-01 DIAGNOSIS — J9801 Acute bronchospasm: Secondary | ICD-10-CM

## 2016-04-01 MED ORDER — ALBUTEROL SULFATE (2.5 MG/3ML) 0.083% IN NEBU
5.0000 mg | INHALATION_SOLUTION | Freq: Once | RESPIRATORY_TRACT | Status: AC
  Administered 2016-04-01: 5 mg via RESPIRATORY_TRACT

## 2016-04-01 MED ORDER — IPRATROPIUM-ALBUTEROL 0.5-2.5 (3) MG/3ML IN SOLN
RESPIRATORY_TRACT | Status: AC
  Filled 2016-04-01: qty 3

## 2016-04-01 MED ORDER — IPRATROPIUM-ALBUTEROL 0.5-2.5 (3) MG/3ML IN SOLN
3.0000 mL | Freq: Once | RESPIRATORY_TRACT | Status: AC
  Administered 2016-04-01: 3 mL via RESPIRATORY_TRACT

## 2016-04-01 MED ORDER — ALBUTEROL SULFATE (2.5 MG/3ML) 0.083% IN NEBU
2.5000 mg | INHALATION_SOLUTION | Freq: Once | RESPIRATORY_TRACT | Status: DC
Start: 2016-04-01 — End: 2016-04-01

## 2016-04-01 MED ORDER — ALBUTEROL SULFATE (2.5 MG/3ML) 0.083% IN NEBU
INHALATION_SOLUTION | RESPIRATORY_TRACT | Status: AC
  Filled 2016-04-01: qty 15

## 2016-04-01 MED ORDER — PREDNISONE 20 MG PO TABS: ORAL_TABLET | ORAL | Status: DC

## 2016-04-01 MED ORDER — ALBUTEROL SULFATE HFA 108 (90 BASE) MCG/ACT IN AERS: 2.0000 | INHALATION_SPRAY | RESPIRATORY_TRACT | Status: AC | PRN

## 2016-04-01 NOTE — Discharge Instructions (Signed)
Allergic Rhinitis For nasal and head congestion may take Sudafed PE 10 mg every 4 hours as needed. Saline nasal spray used frequently. For drainage may use Allegra, Claritin or Zyrtec. If you need stronger medicine to stop drainage may take Chlor-Trimeton 2-4 mg every 4 hours. This may cause drowsiness. Ibuprofen 600 mg every 6 hours as needed for pain, discomfort or fever. Drink plenty of fluids and stay well-hydrated.  Allergic rhinitis is when the mucous membranes in the nose respond to allergens. Allergens are particles in the air that cause your body to have an allergic reaction. This causes you to release allergic antibodies. Through a chain of events, these eventually cause you to release histamine into the blood stream. Although meant to protect the body, it is this release of histamine that causes your discomfort, such as frequent sneezing, congestion, and an itchy, runny nose.  CAUSES Seasonal allergic rhinitis (hay fever) is caused by pollen allergens that may come from grasses, trees, and weeds. Year-round allergic rhinitis (perennial allergic rhinitis) is caused by allergens such as house dust mites, pet dander, and mold spores. SYMPTOMS 1. Nasal stuffiness (congestion). 2. Itchy, runny nose with sneezing and tearing of the eyes. DIAGNOSIS Your health care provider can help you determine the allergen or allergens that trigger your symptoms. If you and your health care provider are unable to determine the allergen, skin or blood testing may be used. Your health care provider will diagnose your condition after taking your health history and performing a physical exam. Your health care provider may assess you for other related conditions, such as asthma, pink eye, or an ear infection. TREATMENT Allergic rhinitis does not have a cure, but it can be controlled by:  Medicines that block allergy symptoms. These may include allergy shots, nasal sprays, and oral antihistamines.  Avoiding the  allergen. Hay fever may often be treated with antihistamines in pill or nasal spray forms. Antihistamines block the effects of histamine. There are over-the-counter medicines that may help with nasal congestion and swelling around the eyes. Check with your health care provider before taking or giving this medicine. If avoiding the allergen or the medicine prescribed do not work, there are many new medicines your health care provider can prescribe. Stronger medicine may be used if initial measures are ineffective. Desensitizing injections can be used if medicine and avoidance does not work. Desensitization is when a patient is given ongoing shots until the body becomes less sensitive to the allergen. Make sure you follow up with your health care provider if problems continue. HOME CARE INSTRUCTIONS It is not possible to completely avoid allergens, but you can reduce your symptoms by taking steps to limit your exposure to them. It helps to know exactly what you are allergic to so that you can avoid your specific triggers. SEEK MEDICAL CARE IF:  You have a fever.  You develop a cough that does not stop easily (persistent).  You have shortness of breath.  You start wheezing.  Symptoms interfere with normal daily activities.   This information is not intended to replace advice given to you by your health care provider. Make sure you discuss any questions you have with your health care provider.   Document Released: 05/31/2001 Document Revised: 09/26/2014 Document Reviewed: 05/13/2013 Elsevier Interactive Patient Education 2016 Else[employer] Inc[employer]  Bronchospasm, Adult A bronchospasm is when the tubes that carry air in and out of your lungs (airways) spasm or tighten. During a bronchospasm it is hard to breathe. This is because  the airways get smaller. A bronchospasm can be triggered by: 3. Allergies. These may be to animals, pollen, food, or mold. 4. Infection. This is a common cause of  bronchospasm. 5. Exercise. 6. Irritants. These include pollution, cigarette smoke, strong odors, aerosol sprays, and paint fumes. 7. Weather changes. 8. Stress. 69. Being emotional. HOME CARE   Always have a plan for getting help. Know when to call your doctor and local emergency services (911 in the [place] Know where you can get emergency care.  Only take medicines as told by your doctor.  If you were prescribed an inhaler or nebulizer machine, ask your doctor how to use it correctly. Always use a spacer with your inhaler if you were given one.  Stay calm during an attack. Try to relax and breathe more slowly.  Control your home environment:  Change your heating and air conditioning filter at least once a month.  Limit your use of fireplaces and wood stoves.  Do not  smoke. Do not  allow smoking in your home.  Avoid perfumes and fragrances.  Get rid of pests (such as roaches and mice) and their droppings.  Throw away plants if you see mold on them.  Keep your house clean and dust free.  Replace carpet with wood, tile, or vinyl flooring. Carpet can trap dander and dust.  Use allergy-proof pillows, mattress covers, and box spring covers.  Wash bed sheets and blankets every week in hot water. Dry them in a dryer.  Use blankets that are made of polyester or cotton.  Wash hands frequently. GET HELP IF:  You have muscle aches.  You have chest pain.  The thick spit you spit or cough up (sputum) changes from clear or white to yellow, green, gray, or bloody.  The thick spit you spit or cough up gets thicker.  There are problems that may be related to the medicine you are given such as:  A rash.  Itching.  Swelling.  Trouble breathing. GET HELP RIGHT AWAY IF:  You feel you cannot breathe or catch your breath.  You cannot stop coughing.  Your treatment is not helping you breathe better.  You have very bad chest pain. MAKE SURE YOU:   Understand these  instructions.  Will watch your condition.  Will get help right away if you are not doing well or get worse.   This information is not intended to replace advice given to you by your health care provider. Make sure you discuss any questions you have with your health care provider.   Document Released: 07/03/2009 Document Revised: 09/26/2014 Document Reviewed: 02/26/2013 Elsevier Interactive Patient Education 2016 [employer].  How to Use an Inhaler Using your inhaler correctly is very important. Good technique will make sure that the medicine reaches your lungs.  HOW TO USE AN INHALER: 10. Take the cap off the inhaler. 11. If this is the first time using your inhaler, you need to prime it. Shake the inhaler for 5 seconds. Release four puffs into the air, away from your face. Ask your doctor for help if you have questions. 12. Shake the inhaler for 5 seconds. 13. Turn the inhaler so the bottle is above the mouthpiece. 14. Put your pointer finger on top of the bottle. Your thumb holds the bottom of the inhaler. 15. Open your mouth. 16. Either hold the inhaler away from your mouth (the width of 2 fingers) or place your lips tightly around the mouthpiece. Ask your doctor which way to  use your inhaler. 17. Breathe out as much air as possible. 18. Breathe in and push down on the bottle 1 time to release the medicine. You will feel the medicine go in your mouth and throat. 19. Continue to take a deep breath in very slowly. Try to fill your lungs. 20. After you have breathed in completely, hold your breath for 10 seconds. This will help the medicine to settle in your lungs. If you cannot hold your breath for 10 seconds, hold it for as long as you can before you breathe out. 21. Breathe out slowly, through pursed lips. Whistling is an example of pursed lips. 22. If your doctor has told you to take more than 1 puff, wait at least 15-30 seconds between puffs. This will help you get the best results  from your medicine. Do not use the inhaler more than your doctor tells you to. 23. Put the cap back on the inhaler. 24. Follow the directions from your doctor or from the inhaler package about cleaning the inhaler. If you use more than one inhaler, ask your doctor which inhalers to use and what order to use them in. Ask your doctor to help you figure out when you will need to refill your inhaler.  If you use a steroid inhaler, always rinse your mouth with water after your last puff, gargle and spit out the water. Do not swallow the water. GET HELP IF:  The inhaler medicine only partially helps to stop wheezing or shortness of breath.  You are having trouble using your inhaler.  You have some increase in thick spit (phlegm). GET HELP RIGHT AWAY IF:  The inhaler medicine does not help your wheezing or shortness of breath or you have tightness in your chest.  You have dizziness, headaches, or fast heart rate.  You have chills, fever, or night sweats.  You have a large increase of thick spit, or your thick spit is bloody. MAKE SURE YOU:   Understand these instructions.  Will watch your condition.  Will get help right away if you are not doing well or get worse.   This information is not intended to replace advice given to you by your health care provider. Make sure you discuss any questions you have with your health care provider.   Document Released: 06/14/2008 Document Revised: 06/26/2013 Document Reviewed: 04/04/2013 Elsevier Interactive Patient Education [employer].

## 2016-04-01 NOTE — ED Provider Notes (Signed)
CSN: [id number]     Arrival date & time 04/01/16  1708 History   First MD Initiated Contact with Patient 04/01/16 1914     Chief Complaint  Patient presents with  . Cough   (Consider location/radiation/quality/duration/timing/severity/associated sxs/prior Treatment) HPI Comments: Morbidly obese 50 year old Arabic female presents to the urgent care along with her significant other with complaints of a one-week history of sneezing and feeling warm. 3 days ago she developed wheezing and a cough tickly at night. She is also noticed a change in her voice.   Past Medical History  Diagnosis Date  . Hypertension   . Kidney stone   . GERD (gastroesophageal reflux disease)   . Diabetes mellitus without complication [facility])    Past Surgical History  Procedure Laterality Date  . Cesarean section      x 4  . Kidney stone surgery     History reviewed. No pertinent family history. Social History  Substance Use Topics  . Smoking status: Never Smoker   . Smokeless tobacco: None  . Alcohol Use: No   OB History    No data available     Review of Systems  Constitutional: Negative for fever, chills, activity change, appetite change and fatigue.  HENT: Positive for congestion, postnasal drip and rhinorrhea. Negative for facial swelling.   Eyes: Negative.   Respiratory: Positive for cough and wheezing.   Cardiovascular: Negative.   Musculoskeletal: Negative for neck pain and neck stiffness.  Skin: Negative for pallor and rash.  Neurological: Negative.     Allergies  Morphine and related and Penicillins  Home Medications   Prior to Admission medications   Medication Sig Start Date End Date Taking? Authorizing Provider  albuterol (PROVENTIL HFA;VENTOLIN HFA) 108 (90 Base) MCG/ACT inhaler Inhale 2 puffs into the lungs every 4 (four) hours as needed for wheezing or shortness of breath. 04/01/16   Janne Napoleon, NP  amLODipine (NORVASC) 5 MG tablet Take 1 tablet (5 mg total) by mouth daily.  11/07/12   Clanford Marisa Hua, MD  aspirin 81 MG chewable tablet Chew 81 mg by mouth daily.    Historical Provider, MD  HYDROcodone-acetaminophen (NORCO/VICODIN) 5-325 MG per tablet Take 2 tablets by mouth every 4 (four) hours as needed. 05/06/15   Antonietta Breach, PA-C  ibuprofen (ADVIL,MOTRIN) 600 MG tablet Take 1 tablet (600 mg total) by mouth every 8 (eight) hours as needed for pain (take with food on stomach). For pain 11/07/12   Clanford Marisa Hua, MD  metFORMIN (GLUCOPHAGE XR) 500 MG 24 hr tablet Take 1 tablet (500 mg total) by mouth daily with breakfast. 11/09/12   Clanford Marisa Hua, MD  methocarbamol (ROBAXIN) 500 MG tablet Take 1 tablet (500 mg total) by mouth 2 (two) times daily. 05/06/15   Antonietta Breach, PA-C  omeprazole (PRILOSEC) 20 MG capsule Take 1 capsule (20 mg total) by mouth 2 (two) times daily. 11/07/12   Clanford Marisa Hua, MD  predniSONE (DELTASONE) 20 MG tablet Take 3 tabs po on first day, 2 tabs second day, 2 tabs third day, 1 tab fourth day, 1 tab 5th day. Take with food. 04/01/16   Janne Napoleon, NP   Meds Ordered and Administered this Visit   Medications  ipratropium-albuterol (DUONEB) 0.5-2.5 (3) MG/3ML nebulizer solution 3 mL (3 mLs Nebulization Given 04/01/16 1930)    BP 119/75 mmHg  Pulse 80  Temp(Src) 98.5 F (36.9 C)  Resp 18  SpO2 100% No data found.   Physical Exam  Constitutional: She is oriented  to person, place, and time. She appears well-developed and well-nourished. No distress.  HENT:  Head: Normocephalic and atraumatic.  Bilateral TMs appear normal. Oropharynx difficult to see due to tongue retraction. Scant clear PND and mild cobblestoning  Eyes: Conjunctivae and EOM are normal.  Neck: Normal range of motion. Neck supple.  Cardiovascular: Normal rate and regular rhythm.   Pulmonary/Chest: Effort normal. No respiratory distress. She has wheezes.  Expiratory wheezes bilaterally.  Musculoskeletal: Normal range of motion. She exhibits no edema.   Lymphadenopathy:    She has no cervical adenopathy.  Neurological: She is alert and oriented to person, place, and time. She exhibits normal muscle tone.  Skin: Skin is warm and dry. No rash noted.  Psychiatric: She has a normal mood and affect.  Nursing note and vitals reviewed.   ED Course  Procedures (including critical care time)  Labs Review Labs Reviewed - No data to display  Imaging Review No results found.   Visual Acuity Review  Right Eye Distance:   Left Eye Distance:   Bilateral Distance:    Right Eye Near:   Left Eye Near:    Bilateral Near:         MDM   1. Other seasonal allergic rhinitis   2. Bronchospasm, acute    Allergic Rhinitis For nasal and head congestion may take Sudafed PE 10 mg every 4 hours as needed. Saline nasal spray used frequently. For drainage may use Allegra, Claritin or Zyrtec. If you need stronger medicine to stop drainage may take Chlor-Trimeton 2-4 mg every 4 hours. This may cause drowsiness. Ibuprofen 600 mg every 6 hours as needed for pain, discomfort or fever. Drink plenty of fluids and stay well-hydrated. Meds ordered this encounter  Medications  . ipratropium-albuterol (DUONEB) 0.5-2.5 (3) MG/3ML nebulizer solution 3 mL    Sig:   . predniSONE (DELTASONE) 20 MG tablet    Sig: Take 3 tabs po on first day, 2 tabs second day, 2 tabs third day, 1 tab fourth day, 1 tab 5th day. Take with food.    Dispense:  9 tablet    Refill:  0    Order Specific Question:  Supervising Provider    Answer:  Melony Overly [id number]  . albuterol (PROVENTIL HFA;VENTOLIN HFA) 108 (90 Base) MCG/ACT inhaler    Sig: Inhale 2 puffs into the lungs every 4 (four) hours as needed for wheezing or shortness of breath.    Dispense:  1 Inhaler    Refill:  0    Order Specific Question:  Supervising Provider    Answer:  Melony Overly [id number]   1952 hrs. Auscultation reveals louder a lateral wheezing. Air movement modestly improved. She continues to have  bilateral coarseness and wheezing. Repeat albuterol neb.  1815 hrs. After second neb patient states she feels better and breathing better. There is a substantial decrease in wheezing although not completely abated. She has improved air movement. She states she is ready to go home.  Janne Napoleon, NP 04/01/16 2019

## 2016-04-01 NOTE — ED Notes (Signed)
Pt here for cough, congestion x 1 week. sts taking day quil with no relief.

## 2016-04-08 ENCOUNTER — Ambulatory Visit (HOSPITAL_COMMUNITY)
Admission: EM | Admit: 2016-04-08 | Discharge: 2016-04-08 | Disposition: A | Discharge status: Home / Self Care | Payer: Self-pay | Attending: Family Medicine | Admitting: Family Medicine

## 2016-04-08 ENCOUNTER — Ambulatory Visit: Payer: Self-pay | Attending: Internal Medicine

## 2016-04-08 ENCOUNTER — Ambulatory Visit (INDEPENDENT_AMBULATORY_CARE_PROVIDER_SITE_OTHER): Payer: Self-pay

## 2016-04-08 ENCOUNTER — Encounter (HOSPITAL_COMMUNITY): Payer: Self-pay | Admitting: Emergency Medicine

## 2016-04-08 DIAGNOSIS — J40 Bronchitis, not specified as acute or chronic: Secondary | ICD-10-CM

## 2016-04-08 DIAGNOSIS — R05 Cough: Secondary | ICD-10-CM

## 2016-04-08 DIAGNOSIS — R059 Cough, unspecified: Secondary | ICD-10-CM

## 2016-04-08 MED ORDER — BENZONATATE 100 MG PO CAPS: 200.0000 mg | ORAL_CAPSULE | Freq: Three times a day (TID) | ORAL | Status: DC | PRN

## 2016-04-08 MED ORDER — ALBUTEROL SULFATE (2.5 MG/3ML) 0.083% IN NEBU
2.5000 mg | INHALATION_SOLUTION | Freq: Once | RESPIRATORY_TRACT | Status: AC
  Administered 2016-04-08: 2.5 mg via RESPIRATORY_TRACT

## 2016-04-08 MED ORDER — PREDNISONE 10 MG PO TABS: ORAL_TABLET | ORAL | Status: DC

## 2016-04-08 MED ORDER — AZITHROMYCIN 250 MG PO TABS: ORAL_TABLET | ORAL | Status: DC

## 2016-04-08 MED ORDER — ALBUTEROL SULFATE (2.5 MG/3ML) 0.083% IN NEBU
INHALATION_SOLUTION | RESPIRATORY_TRACT | Status: AC
  Filled 2016-04-08: qty 3

## 2016-04-08 NOTE — ED Notes (Signed)
The patient presented to the [facility] with a complaint of a cough. The patient was evaluated at the [facility] for the same complaint 1 week ago and prescribed an oral steroid and inhaler that she has used with no relief.

## 2016-04-08 NOTE — Discharge Instructions (Signed)

## 2016-04-08 NOTE — ED Provider Notes (Signed)
CSN: [id number]     Arrival date & time 04/08/16  1658 History   None    No chief complaint on file.  (Consider location/radiation/quality/duration/timing/severity/associated sxs/prior Treatment) Patient is a 50 y.o. female presenting with cough. The history is provided by the patient.  Cough Cough characteristics:  Productive Sputum characteristics:  Clear Severity:  Mild Onset quality:  Gradual Duration:  3 weeks Timing:  Constant Chronicity:  Recurrent Smoker: no   Context: exposure to allergens   Relieved by:  Beta-agonist inhaler and cough suppressants Worsened by:  Environmental changes Ineffective treatments:  Beta-agonist inhaler Associated symptoms: fever, shortness of breath and wheezing     Past Medical History  Diagnosis Date  . Hypertension   . Kidney stone   . GERD (gastroesophageal reflux disease)   . Diabetes mellitus without complication [facility])    Past Surgical History  Procedure Laterality Date  . Cesarean section      x 4  . Kidney stone surgery     No family history on file. Social History  Substance Use Topics  . Smoking status: Never Smoker   . Smokeless tobacco: Not on file  . Alcohol Use: No   OB History    No data available     Review of Systems  Constitutional: Positive for fever.  HENT: Negative.   Eyes: Negative.   Respiratory: Positive for cough, shortness of breath and wheezing.   Gastrointestinal: Negative.   Endocrine: Negative.   Genitourinary: Negative.   Musculoskeletal: Negative.   Skin: Negative.   Allergic/Immunologic: Negative.   Neurological: Negative.   Hematological: Negative.   Psychiatric/Behavioral: Negative.     Allergies  Morphine and related and Penicillins  Home Medications   Prior to Admission medications   Medication Sig Start Date End Date Taking? Authorizing Provider  albuterol (PROVENTIL HFA;VENTOLIN HFA) 108 (90 Base) MCG/ACT inhaler Inhale 2 puffs into the lungs every 4 (four) hours as needed  for wheezing or shortness of breath. 04/01/16   Janne Napoleon, NP  amLODipine (NORVASC) 5 MG tablet Take 1 tablet (5 mg total) by mouth daily. 11/07/12   Clanford Marisa Hua, MD  aspirin 81 MG chewable tablet Chew 81 mg by mouth daily.    Historical Provider, MD  HYDROcodone-acetaminophen (NORCO/VICODIN) 5-325 MG per tablet Take 2 tablets by mouth every 4 (four) hours as needed. 05/06/15   Antonietta Breach, PA-C  ibuprofen (ADVIL,MOTRIN) 600 MG tablet Take 1 tablet (600 mg total) by mouth every 8 (eight) hours as needed for pain (take with food on stomach). For pain 11/07/12   Clanford Marisa Hua, MD  metFORMIN (GLUCOPHAGE XR) 500 MG 24 hr tablet Take 1 tablet (500 mg total) by mouth daily with breakfast. 11/09/12   Clanford Marisa Hua, MD  methocarbamol (ROBAXIN) 500 MG tablet Take 1 tablet (500 mg total) by mouth 2 (two) times daily. 05/06/15   Antonietta Breach, PA-C  omeprazole (PRILOSEC) 20 MG capsule Take 1 capsule (20 mg total) by mouth 2 (two) times daily. 11/07/12   Clanford Marisa Hua, MD  predniSONE (DELTASONE) 20 MG tablet Take 3 tabs po on first day, 2 tabs second day, 2 tabs third day, 1 tab fourth day, 1 tab 5th day. Take with food. 04/01/16   Janne Napoleon, NP   Meds Ordered and Administered this Visit  Medications - No data to display  BP 157/96 mmHg  Pulse 87  Temp(Src) 98.8 F (37.1 C) (Oral)  Resp 18  SpO2 98% No data found.   Physical Exam  Constitutional: She appears well-developed and well-nourished.  HENT:  Head: Normocephalic.  Right Ear: External ear normal.  Left Ear: External ear normal.  Mouth/Throat: Oropharynx is clear and moist.  Eyes: Conjunctivae and EOM are normal. Pupils are equal, round, and reactive to light.  Neck: Normal range of motion. Neck supple.  Cardiovascular: Normal rate, regular rhythm and normal heart sounds.   Pulmonary/Chest: Effort normal. She has wheezes.    ED Course  Procedures (including critical care time)  Labs Review Labs Reviewed - No data to  display  Imaging Review No results found.   Visual Acuity Review  Right Eye Distance:   Left Eye Distance:   Bilateral Distance:    Right Eye Near:   Left Eye Near:    Bilateral Near:         MDM  Bronchitis  Cough Bronchospasms  CXR wnl.  Good response with neb treatment here in clinic Prednisone 10mg  4po qd x 3d then 3po qd x 3d then 2po qd x 3d then 1po qdx 3d then stop Z-Pak as directed Tessalon Perles 200mg  one po tid prn #21 Continue albuterol MDI     Lysbeth Penner, FNP 04/08/16 1829

## 2016-05-07 ENCOUNTER — Encounter (INDEPENDENT_AMBULATORY_CARE_PROVIDER_SITE_OTHER): Payer: Self-pay

## 2016-06-20 ENCOUNTER — Emergency Department (HOSPITAL_COMMUNITY): Payer: Self-pay

## 2016-06-20 ENCOUNTER — Emergency Department (HOSPITAL_COMMUNITY)
Admission: EM | Admit: 2016-06-20 | Discharge: 2016-06-21 | Disposition: A | Discharge status: Home / Self Care | Payer: Self-pay | Attending: Emergency Medicine | Admitting: Emergency Medicine

## 2016-06-20 DIAGNOSIS — I10 Essential (primary) hypertension: Secondary | ICD-10-CM | POA: Insufficient documentation

## 2016-06-20 DIAGNOSIS — B029 Zoster without complications: Secondary | ICD-10-CM | POA: Insufficient documentation

## 2016-06-20 DIAGNOSIS — Z79899 Other long term (current) drug therapy: Secondary | ICD-10-CM | POA: Insufficient documentation

## 2016-06-20 DIAGNOSIS — Z7984 Long term (current) use of oral hypoglycemic drugs: Secondary | ICD-10-CM | POA: Insufficient documentation

## 2016-06-20 DIAGNOSIS — Z7982 Long term (current) use of aspirin: Secondary | ICD-10-CM | POA: Insufficient documentation

## 2016-06-20 DIAGNOSIS — E119 Type 2 diabetes mellitus without complications: Secondary | ICD-10-CM | POA: Insufficient documentation

## 2016-06-20 LAB — I-STAT TROPONIN, ED: TROPONIN I, POC: 0 ng/mL (ref 0.00–0.08)

## 2016-06-20 LAB — BASIC METABOLIC PANEL
ANION GAP: 7 (ref 5–15)
BUN: 12 mg/dL (ref 6–20)
CO2: 25 mmol/L (ref 22–32)
Calcium: 9.1 mg/dL (ref 8.9–10.3)
Chloride: 104 mmol/L (ref 101–111)
Creatinine, Ser: 0.58 mg/dL (ref 0.44–1.00)
GFR calc Af Amer: >60 mL/min (ref >60)
GFR calc non Af Amer: >60 mL/min (ref >60)
GLUCOSE: 132 mg/dL — AB (ref 65–99)
POTASSIUM: 3.5 mmol/L (ref 3.5–5.1)
Sodium: 136 mmol/L (ref 135–145)

## 2016-06-20 LAB — CBC
HEMATOCRIT: 40.4 % (ref 36.0–46.0)
HEMOGLOBIN: 13.2 g/dL (ref 12.0–15.0)
MCH: 26.7 pg (ref 26.0–34.0)
MCHC: 32.7 g/dL (ref 30.0–36.0)
MCV: 81.6 fL (ref 78.0–100.0)
Platelets: 187 10*3/uL (ref 150–400)
RBC: 4.95 MIL/uL (ref 3.87–5.11)
RDW: 14.2 % (ref 11.5–15.5)
WBC: 5.6 10*3/uL (ref 4.0–10.5)

## 2016-06-20 MED ORDER — VALACYCLOVIR HCL 1 G PO TABS: 1000.0000 mg | ORAL_TABLET | Freq: Three times a day (TID) | ORAL | 0 refills | Status: AC

## 2016-06-20 MED ORDER — HYDROCODONE-ACETAMINOPHEN 5-325 MG PO TABS
1.0000 | ORAL_TABLET | Freq: Once | ORAL | Status: AC
  Administered 2016-06-20: 1 via ORAL
  Filled 2016-06-20: qty 1

## 2016-06-20 MED ORDER — HYDROCODONE-ACETAMINOPHEN 5-325 MG PO TABS: 2.0000 | ORAL_TABLET | ORAL | 0 refills | Status: DC | PRN

## 2016-06-20 MED ORDER — VALACYCLOVIR HCL 500 MG PO TABS
1000.0000 mg | ORAL_TABLET | Freq: Once | ORAL | Status: AC
Start: 2016-06-20 — End: 2016-06-20
  Administered 2016-06-20: 1000 mg via ORAL
  Filled 2016-06-20: qty 2

## 2016-06-20 NOTE — ED Triage Notes (Signed)
Pt states that she started having chest pain and a rash on her R arm 1 week ago.  Alert and oriented.

## 2016-06-20 NOTE — ED Provider Notes (Signed)
[facility] DEPT Provider Note   CSN: [id number] Arrival date & time: 06/20/16  2040     History   Chief Complaint Chief Complaint  Patient presents with  . Chest Pain  . Rash    HPI Angela Wilson is a 50 y.o. female.  She presents for evaluation of intermittent chest pain, left-sided, for 1 week. She began having right shoulder pain 3 days ago and a rash on her right arm yesterday. She denies shortness of breath, nausea, vomiting, fever, chills, weakness or dizziness. No prior similar problem with chest pain, or rash. The rash feels both pruritic and painful. There are no other known modifying factors.  HPI  Past Medical History:  Diagnosis Date  . Diabetes mellitus without complication ([place])   . GERD (gastroesophageal reflux disease)   . Hypertension   . Kidney stone     Patient Active Problem List   Diagnosis Date Noted  . Nephrolithiasis 12/20/2012  . GERD (gastroesophageal reflux disease) 12/20/2012  . Type 2 diabetes mellitus ([place]) 11/09/2012    Past Surgical History:  Procedure Laterality Date  . CESAREAN SECTION     x 4  . KIDNEY STONE SURGERY      OB History    No data available       Home Medications    Prior to Admission medications   Medication Sig Start Date End Date Taking? Authorizing Provider  albuterol (PROVENTIL HFA;VENTOLIN HFA) 108 (90 Base) MCG/ACT inhaler Inhale 2 puffs into the lungs every 4 (four) hours as needed for wheezing or shortness of breath. 04/01/16   Janne Napoleon, NP  amLODipine (NORVASC) 5 MG tablet Take 1 tablet (5 mg total) by mouth daily. 11/07/12   Clanford Marisa Hua, MD  aspirin 81 MG chewable tablet Chew 81 mg by mouth daily.    Historical Provider, MD  azithromycin (ZITHROMAX) 250 MG tablet Take 2 po first day and then one po qd x 4 days 04/08/16   Lysbeth Penner, FNP  benzonatate (TESSALON) 100 MG capsule Take 2 capsules (200 mg total) by mouth 3 (three) times daily as needed for cough. 04/08/16   Lysbeth Penner,  FNP  cetirizine (ZYRTEC) 10 MG tablet Take 10 mg by mouth daily.    Historical Provider, MD  HYDROcodone-acetaminophen (NORCO/VICODIN) 5-325 MG per tablet Take 2 tablets by mouth every 4 (four) hours as needed. 05/06/15   Antonietta Breach, PA-C  ibuprofen (ADVIL,MOTRIN) 600 MG tablet Take 1 tablet (600 mg total) by mouth every 8 (eight) hours as needed for pain (take with food on stomach). For pain 11/07/12   Clanford Marisa Hua, MD  metFORMIN (GLUCOPHAGE XR) 500 MG 24 hr tablet Take 1 tablet (500 mg total) by mouth daily with breakfast. 11/09/12   Clanford Marisa Hua, MD  methocarbamol (ROBAXIN) 500 MG tablet Take 1 tablet (500 mg total) by mouth 2 (two) times daily. 05/06/15   Antonietta Breach, PA-C  omeprazole (PRILOSEC) 20 MG capsule Take 1 capsule (20 mg total) by mouth 2 (two) times daily. 11/07/12   Clanford Marisa Hua, MD  predniSONE (DELTASONE) 10 MG tablet Take 4 po qd x 3 days then 3 po qd x 3 days then 2 po qd x 3days then 1 po qd x 3 days then stop 04/08/16   Lysbeth Penner, FNP  predniSONE (DELTASONE) 20 MG tablet Take 3 tabs po on first day, 2 tabs second day, 2 tabs third day, 1 tab fourth day, 1 tab 5th day. Take with food. 04/01/16  Janne Napoleon, NP    Family History No family history on file.  Social History Social History  Substance Use Topics  . Smoking status: Never Smoker  . Smokeless tobacco: Never Used  . Alcohol use No     Allergies   Morphine and related and Penicillins   Review of Systems Review of Systems  All other systems reviewed and are negative.    Physical Exam Updated Vital Signs BP 135/66 (BP Location: Right Arm)   Pulse 91   Temp 98.7 F (37.1 C) (Oral)   Resp 18   Ht 5\' 4"  (1.626 m)   Wt 246 lb (111.6 kg)   SpO2 99%   BMI 42.23 kg/m   Physical Exam  Constitutional: She is oriented to person, place, and time. She appears well-developed and well-nourished. No distress.  HENT:  Head: Normocephalic and atraumatic.  Eyes: Conjunctivae and EOM are  normal. Pupils are equal, round, and reactive to light.  Neck: Normal range of motion and phonation normal. Neck supple.  Cardiovascular: Normal rate and regular rhythm.   Pulmonary/Chest: Effort normal and breath sounds normal. She exhibits no tenderness.  Abdominal: Soft. She exhibits no distension. There is no tenderness. There is no guarding.  Musculoskeletal: Normal range of motion. She exhibits no deformity.  Neurological: She is alert and oriented to person, place, and time. She exhibits normal muscle tone.  Skin: Skin is warm and dry.  Right arm, extending to the thenar eminence and right thumb with red rash, multiple areas, several with associated vesicles. No petechiae. No areas of fluctuance or drainage.  Psychiatric: She has a normal mood and affect. Her behavior is normal. Judgment and thought content normal.  Nursing note and vitals reviewed.    ED Treatments / Results  Labs (all labs ordered are listed, but only abnormal results are displayed) Labs Reviewed  BASIC METABOLIC PANEL - Abnormal; Notable for the following:       Result Value   Glucose, Bld 132 (*)    All other components within normal limits  CBC  I-STAT TROPOININ, ED    EKG  EKG Interpretation  Date/Time:  Monday June 20 2016 20:55:42 EDT Ventricular Rate:  96 PR Interval:    QRS Duration: 91 QT Interval:  359 QTC Calculation: [id number] R Axis:   28 Text Interpretation:  Sinus rhythm Probable left atrial enlargement since last tracing no significant change Confirmed by Eulis Foster  MD, Rida Loudin [phone]) on 06/20/2016 11:11:37 PM       Radiology Dg Chest 2 View  Result Date: 06/20/2016 CLINICAL DATA:  Left chest pain, right shoulder pain and rash on the right arm since 06/17/2006. EXAM: CHEST  2 VIEW COMPARISON:  PA and lateral chest 04/08/2016 and 05/05/2015. FINDINGS: The lungs are clear. Heart size is normal. No pneumothorax or pleural effusion. No bony abnormality. IMPRESSION: Negative chest.  Electronically Signed   By: Inge Rise M.D.   On: 06/20/2016 21:48    Procedures Procedures (including critical care time)  Medications Ordered in ED Medications - No data to display   Initial Impression / Assessment and Plan / ED Course  I have reviewed the triage vital signs and the nursing notes.  Pertinent labs & imaging results that were available during my care of the patient were reviewed by me and considered in my medical decision making (see chart for details).  Clinical Course    Medications  valACYclovir (VALTREX) tablet 1,000 mg (not administered)  HYDROcodone-acetaminophen (NORCO/VICODIN) 5-325 MG per tablet 1  tablet (not administered)    Patient Vitals for the past 24 hrs:  BP Temp Temp src Pulse Resp SpO2 Height Weight  06/20/16 2248 135/66 - - 91 18 99 % - -  06/20/16 2118 151/79 98.7 F (37.1 C) Oral 100 20 98 % 5\' 4"  (1.626 m) 246 lb (111.6 kg)    11:28 PM Reevaluation with update and discussion. After initial assessment and treatment, an updated evaluation reveals No further complaints. Findings discussed with patient, and daughter, all questions answered. Primus Gritton L    Final Clinical Impressions(s) / ED Diagnoses   Final diagnoses:  Herpes zoster without complication    Right arm rash consistent with shingles. Nonspecific chest pain with normal ED evaluation. Doubt ACS, PE or pneumonia.  Nursing Notes Reviewed/ Care Coordinated Applicable Imaging Reviewed Interpretation of Laboratory Data incorporated into ED treatment  The patient appears reasonably screened and/or stabilized for discharge and I doubt any other medical condition or other [facility] requiring further screening, evaluation, or treatment in the ED at this time prior to discharge.  Plan: Home Medications- continue; Home Treatments- rest; return here if the recommended treatment, does not improve the symptoms; Recommended follow up- PCP prn   New Prescriptions New Prescriptions     No medications on file     Daleen Bo, MD 06/20/16 2340

## 2016-07-11 ENCOUNTER — Ambulatory Visit (INDEPENDENT_AMBULATORY_CARE_PROVIDER_SITE_OTHER): Payer: Self-pay

## 2016-07-11 ENCOUNTER — Ambulatory Visit (HOSPITAL_COMMUNITY)
Admission: EM | Admit: 2016-07-11 | Discharge: 2016-07-11 | Disposition: A | Discharge status: Home / Self Care | Payer: Self-pay | Attending: Physician Assistant | Admitting: Physician Assistant

## 2016-07-11 ENCOUNTER — Encounter (HOSPITAL_COMMUNITY): Payer: Self-pay | Admitting: Emergency Medicine

## 2016-07-11 DIAGNOSIS — M79601 Pain in right arm: Secondary | ICD-10-CM

## 2016-07-11 HISTORY — DX: Zoster without complications: B02.9

## 2016-07-11 LAB — GLUCOSE, CAPILLARY: Glucose-Capillary: 166 mg/dL — ABNORMAL HIGH (ref 65–99)

## 2016-07-11 MED ORDER — OXYCODONE-ACETAMINOPHEN 5-325 MG PO TABS: 2.0000 | ORAL_TABLET | ORAL | 0 refills | Status: DC | PRN

## 2016-07-11 MED ORDER — PREDNISONE 10 MG PO TABS: ORAL_TABLET | ORAL | 0 refills | Status: DC

## 2016-07-11 NOTE — Discharge Instructions (Signed)
Do not drive or operate machinery while taking Percocet. Set up PCP to follow up with

## 2016-07-11 NOTE — ED Provider Notes (Signed)
CSN: [id number]     Arrival date & time 07/11/16  1057 History   First MD Initiated Contact with Patient 07/11/16 1224     Chief Complaint  Patient presents with  . Shoulder Pain  . Arm Pain   (Consider location/radiation/quality/duration/timing/severity/associated sxs/prior Treatment) HPI   50 y.o. female presents to UC with c/o right sided shoulder and arm pain. Thinks pain is related to shingles which she was diagnosed with earlier this month. Seen in ED on 10/2 and given valacyclovir and hydrocodone-acetaminophen for tx of shingles.  Rash was present along right arm and hand and has since improved.  States that she was also given steroid taper which provided relief for a few days but the pain has since returned. A sharp shooting sensation that begins in the right shoulder and goes down the arm into the right thumb. Daughter states that she has dropped a mug b/c of the pain that occurs. Pain is intermittent and nothing seems to aggravate or relieve it. Can come on when resting or moving.  Denies prior injury to this area or any previous pain in this arm. Reports diabetes with well-controlled sugars. Denies fever, fatigue, chest pain, shortness of breath, dysuria.   Past Medical History:  Diagnosis Date  . Diabetes mellitus without complication ([place])   . GERD (gastroesophageal reflux disease)   . Hypertension   . Kidney stone   . Shingles    Past Surgical History:  Procedure Laterality Date  . CESAREAN SECTION     x 4  . KIDNEY STONE SURGERY     History reviewed. No pertinent family history. Social History  Substance Use Topics  . Smoking status: Never Smoker  . Smokeless tobacco: Never Used  . Alcohol use No   OB History    No data available     Review of Systems  See HPI    Allergies  Morphine and related and Penicillins  Home Medications   Prior to Admission medications   Medication Sig Start Date End Date Taking? Authorizing Provider  amLODipine (NORVASC) 5 MG  tablet Take 1 tablet (5 mg total) by mouth daily. 11/07/12  Yes Clanford Marisa Hua, MD  aspirin 81 MG chewable tablet Chew 81 mg by mouth daily.   Yes Historical Provider, MD  ibuprofen (ADVIL,MOTRIN) 600 MG tablet Take 1 tablet (600 mg total) by mouth every 8 (eight) hours as needed for pain (take with food on stomach). For pain 11/07/12  Yes Clanford Marisa Hua, MD  metFORMIN (GLUCOPHAGE XR) 500 MG 24 hr tablet Take 1 tablet (500 mg total) by mouth daily with breakfast. 11/09/12  Yes Clanford Marisa Hua, MD  methocarbamol (ROBAXIN) 500 MG tablet Take 1 tablet (500 mg total) by mouth 2 (two) times daily. 05/06/15  Yes Antonietta Breach, PA-C  omeprazole (PRILOSEC) 20 MG capsule Take 1 capsule (20 mg total) by mouth 2 (two) times daily. 11/07/12  Yes Clanford Marisa Hua, MD  predniSONE (DELTASONE) 10 MG tablet Take 4 po qd x 3 days then 3 po qd x 3 days then 2 po qd x 3days then 1 po qd x 3 days then stop 04/08/16  Yes Lysbeth Penner, FNP  albuterol (PROVENTIL HFA;VENTOLIN HFA) 108 (90 Base) MCG/ACT inhaler Inhale 2 puffs into the lungs every 4 (four) hours as needed for wheezing or shortness of breath. 04/01/16   Janne Napoleon, NP  azithromycin (ZITHROMAX) 250 MG tablet Take 2 po first day and then one po qd x 4 days 04/08/16  Lysbeth Penner, FNP  benzonatate (TESSALON) 100 MG capsule Take 2 capsules (200 mg total) by mouth 3 (three) times daily as needed for cough. 04/08/16   Lysbeth Penner, FNP  cetirizine (ZYRTEC) 10 MG tablet Take 10 mg by mouth daily.    Historical Provider, MD  HYDROcodone-acetaminophen (NORCO) 5-325 MG tablet Take 2 tablets by mouth every 4 (four) hours as needed for moderate pain. 06/20/16   Daleen Bo, MD  oxyCODONE-acetaminophen (PERCOCET/ROXICET) 5-325 MG tablet Take 2 tablets by mouth every 4 (four) hours as needed for severe pain. 07/11/16   Konrad Felix, PA  predniSONE (DELTASONE) 10 MG tablet Sig: 4 tables once a day for 3 days, 3 tablets once a day X3 days, 2 tablets a day for 3  days, 1 tablet a day for 3 days. Disp 30/10 mg 07/11/16   Konrad Felix, PA  predniSONE (DELTASONE) 20 MG tablet Take 3 tabs po on first day, 2 tabs second day, 2 tabs third day, 1 tab fourth day, 1 tab 5th day. Take with food. 04/01/16   Janne Napoleon, NP   Meds Ordered and Administered this Visit  Medications - No data to display  BP 125/61 (BP Location: Left Arm)   Pulse 88   Temp 98.4 F (36.9 C) (Oral)   Resp 20   SpO2 98%  No data found.   Physical Exam  Constitutional: She is oriented to person, place, and time. She appears well-developed and well-nourished.  HENT:  Head: Normocephalic and atraumatic.  Eyes: EOM are normal.  Neck: Normal range of motion.  Cardiovascular: Normal rate.   Pulmonary/Chest: Effort normal. No respiratory distress.  Abdominal: Soft.  Musculoskeletal: Normal range of motion. She exhibits no edema, tenderness or deformity.       Right shoulder: She exhibits pain (Slight anterior sholder pain with internal rotation of shoulder ). She exhibits normal range of motion, no tenderness (No tenderness to palpation) and normal strength.       Right elbow: Normal.      Right wrist: Normal.  Neurological: She is alert and oriented to person, place, and time.  Skin: Skin is warm and dry. Capillary refill takes less than 2 seconds. Rash (Remnants of healing blistering rash on palm of right hand) noted. No erythema.  Nursing note and vitals reviewed.   Urgent Care Course   Clinical Course    Procedures (including critical care time)  Labs Review Labs Reviewed  GLUCOSE, CAPILLARY - Abnormal; Notable for the following:       Result Value   Glucose-Capillary 166 (*)    All other components within normal limits    Imaging Review Dg Shoulder Right  Result Date: 07/11/2016 CLINICAL DATA:  Right shoulder pain without injury. EXAM: RIGHT SHOULDER - 2+ VIEW COMPARISON:  None. FINDINGS: There is no evidence of fracture or dislocation. There is no evidence of  arthropathy or other focal bone abnormality. Soft tissues are unremarkable. IMPRESSION: Negative. Electronically Signed   By: Misty Stanley M.D.   On: 07/11/2016 12:38     MDM   1. Right arm pain    50 y.o. Female presents with c/o right shoulder pain that radiates down right arm into right thumb.  She believes it is related to recent episode of shingles.   Based on description of rash and pain (going down all of right arm and into hand) unsure if shingles was the cause.  Performed shoulder x-ray to rule out any bony abnormality or calcific  tendonitis.    Shoulder x-ray unremarkable CBG: 166.  States that sugars are well controlled and did not have an issue with the last taper of prednisone taken a few weeks ago.  Prescribed Percocet 5-325 mg q 4 hours prn for pain and prednisone 10 mg 20 tablet taper (4 tables once a day for 3 days, 3 tablets once a day X3 days, 2 tablets a day for 3 days, 1 tablet a day for 3 days) for possible nerve impingement/inflammation.  Advised of precautions while taking percocet.  Return or seek PCP for worsening of symptoms.    Konrad Felix, [place] 07/11/16 1758

## 2016-07-11 NOTE — ED Triage Notes (Signed)
The patient presented to the [facility] with a complaint of right shoulder, arm and hand pain x 3 weeks. The patient stated that she had the shingles virus that manifested in that area and was treated with antiviral medication. She stated that the rash has subsided but the pain still continues.

## 2016-08-19 ENCOUNTER — Ambulatory Visit (INDEPENDENT_AMBULATORY_CARE_PROVIDER_SITE_OTHER): Payer: Self-pay | Admitting: Orthopaedic Surgery

## 2018-02-28 ENCOUNTER — Other Ambulatory Visit: Payer: Self-pay | Admitting: Orthopedic Surgery

## 2018-02-28 DIAGNOSIS — M5416 Radiculopathy, lumbar region: Secondary | ICD-10-CM

## 2018-03-06 ENCOUNTER — Ambulatory Visit (INDEPENDENT_AMBULATORY_CARE_PROVIDER_SITE_OTHER): Payer: Self-pay | Admitting: Orthopaedic Surgery

## 2018-03-12 ENCOUNTER — Ambulatory Visit
Admission: RE | Admit: 2018-03-12 | Discharge: 2018-03-12 | Disposition: A | Discharge status: Home / Self Care | Payer: BLUE CROSS/BLUE SHIELD | Source: Ambulatory Visit | Attending: Orthopedic Surgery | Admitting: Orthopedic Surgery

## 2018-03-12 DIAGNOSIS — M5416 Radiculopathy, lumbar region: Secondary | ICD-10-CM

## 2018-03-12 MED ORDER — IOPAMIDOL (ISOVUE-M 200) INJECTION 41%
1.0000 mL | Freq: Once | INTRAMUSCULAR | Status: AC
  Administered 2018-03-12: 1 mL via EPIDURAL

## 2018-03-12 MED ORDER — METHYLPREDNISOLONE ACETATE 40 MG/ML INJ SUSP (RADIOLOG
120.0000 mg | Freq: Once | INTRAMUSCULAR | Status: AC
  Administered 2018-03-12: 120 mg via EPIDURAL

## 2018-03-12 NOTE — Discharge Instructions (Signed)

## 2019-12-30 DIAGNOSIS — K8689 Other specified diseases of pancreas: Secondary | ICD-10-CM | POA: Insufficient documentation

## 2020-01-07 ENCOUNTER — Other Ambulatory Visit: Payer: Self-pay

## 2020-01-07 ENCOUNTER — Ambulatory Visit (INDEPENDENT_AMBULATORY_CARE_PROVIDER_SITE_OTHER): Payer: Self-pay | Admitting: Adult Health Nurse Practitioner

## 2020-01-07 ENCOUNTER — Encounter: Payer: Self-pay | Admitting: Adult Health Nurse Practitioner

## 2020-01-07 VITALS — BP 120/67 | HR 107 | Temp 97.3°F | Resp 17 | Ht 64.0 in | Wt 231.2 lb

## 2020-01-07 DIAGNOSIS — K8689 Other specified diseases of pancreas: Secondary | ICD-10-CM

## 2020-01-07 DIAGNOSIS — Z794 Long term (current) use of insulin: Secondary | ICD-10-CM

## 2020-01-07 DIAGNOSIS — Z1231 Encounter for screening mammogram for malignant neoplasm of breast: Secondary | ICD-10-CM

## 2020-01-07 DIAGNOSIS — E1165 Type 2 diabetes mellitus with hyperglycemia: Secondary | ICD-10-CM

## 2020-01-07 DIAGNOSIS — Z23 Encounter for immunization: Secondary | ICD-10-CM

## 2020-01-07 DIAGNOSIS — Z1211 Encounter for screening for malignant neoplasm of colon: Secondary | ICD-10-CM | POA: Insufficient documentation

## 2020-01-07 LAB — COMPREHENSIVE METABOLIC PANEL
ALT: 10 IU/L (ref 0–32)
AST: 11 IU/L (ref 0–40)
Albumin/Globulin Ratio: 1.4 (ref 1.2–2.2)
Albumin: 4.2 g/dL (ref 3.8–4.9)
Alkaline Phosphatase: 60 IU/L (ref 39–117)
BUN/Creatinine Ratio: 17 (ref 9–23)
BUN: 10 mg/dL (ref 6–24)
Bilirubin Total: 0.2 mg/dL (ref 0.0–1.2)
CO2: 20 mmol/L (ref 20–29)
Calcium: 10 mg/dL (ref 8.7–10.2)
Chloride: 99 mmol/L (ref 96–106)
Creatinine, Ser: 0.59 mg/dL (ref 0.57–1.00)
GFR calc Af Amer: 122 mL/min/{1.73_m2} (ref >59)
GFR calc non Af Amer: 106 mL/min/{1.73_m2} (ref >59)
Globulin, Total: 3 g/dL (ref 1.5–4.5)
Glucose: 264 mg/dL — ABNORMAL HIGH (ref 65–99)
Potassium: 4.4 mmol/L (ref 3.5–5.2)
Sodium: 136 mmol/L (ref 134–144)
Total Protein: 7.2 g/dL (ref 6.0–8.5)

## 2020-01-07 LAB — POCT GLYCOSYLATED HEMOGLOBIN (HGB A1C): Hemoglobin A1C: 9.6 % — AB (ref 4.0–5.6)

## 2020-01-07 LAB — GLUCOSE, POCT (MANUAL RESULT ENTRY): POC Glucose: 286 mg/dl — AB (ref 70–99)

## 2020-01-07 MED ORDER — METOCLOPRAMIDE HCL 5 MG PO TABS: 5.0000 mg | ORAL_TABLET | Freq: Three times a day (TID) | ORAL | 3 refills | Status: DC

## 2020-01-07 NOTE — Patient Instructions (Signed)
° ° ° °  If you have lab work done today you will be contacted with your lab results within the next 2 weeks.  If you have not heard from us then please contact us. The fastest way to get your results is to register for My Chart. ° ° °IF you received an x-ray today, you will receive an invoice from [facility]. Please contact [facility] at [phone] with questions or concerns regarding your invoice.  ° °IF you received labwork today, you will receive an invoice from [facility]. Please contact [facility] at [phone] with questions or concerns regarding your invoice.  ° °Our billing staff will not be able to assist you with questions regarding bills from these companies. ° °You will be contacted with the lab results as soon as they are available. The fastest way to get your results is to activate your My Chart account. Instructions are located on the last page of this paperwork. If you have not heard from us regarding the results in 2 weeks, please contact this office. °  ° ° ° °

## 2020-01-07 NOTE — Progress Notes (Signed)
Chief Complaint  Patient presents with  . New Patient (Initial Visit)    establish care    HPI   Patient is Angela Wilson and presents for evaluation with her daughter who is the interpreter. She was seen in the past by [facility] but I have no records.  Daughter reports an incidentally found lesion on the pancreas that she was told her mother needs referral to surgeon.   In addition, patient has diabetes.  Not watching diet.  BS today is 286.  Will need labs that were done in the past 2 weeks.  Will have patient sign release.   Patient reports signs of diabetic gastroparesis with bloating and discomfort after eating.  In addition, she has had several abdominal surgeries resulting in scar tissue which has been a chronic problem.   Problem List    Problem List: 2014-04: Nephrolithiasis 2014-04: GERD (gastroesophageal reflux disease) 2014-02: Type 2 diabetes mellitus (HCC)   Allergies   is allergic to morphine and related and penicillins.  Medications    Current Outpatient Medications:  .  albuterol (PROVENTIL HFA;VENTOLIN HFA) 108 (90 Base) MCG/ACT inhaler, Inhale 2 puffs into the lungs every 4 (four) hours as needed for wheezing or shortness of breath., Disp: 1 Inhaler, Rfl: 0 .  amLODipine (NORVASC) 5 MG tablet, Take 1 tablet (5 mg total) by mouth daily., Disp: 30 tablet, Rfl: 2 .  aspirin 81 MG chewable tablet, Chew 81 mg by mouth daily., Disp: , Rfl:  .  cetirizine (ZYRTEC) 10 MG tablet, Take 10 mg by mouth daily., Disp: , Rfl:  .  ibuprofen (ADVIL,MOTRIN) 600 MG tablet, Take 1 tablet (600 mg total) by mouth every 8 (eight) hours as needed for pain (take with food on stomach). For pain, Disp: 20 tablet, Rfl: 0 .  metFORMIN (GLUCOPHAGE XR) 500 MG 24 hr tablet, Take 1 tablet (500 mg total) by mouth daily with breakfast., Disp: 30 tablet, Rfl: 1 .  omeprazole (PRILOSEC) 20 MG capsule, Take 1 capsule (20 mg total) by mouth 2 (two) times daily., Disp: 60 capsule, Rfl: 2 .  azithromycin  (ZITHROMAX) 250 MG tablet, Take 2 po first day and then one po qd x 4 days (Patient not taking: Reported on 01/07/2020), Disp: 6 tablet, Rfl: 0 .  benzonatate (TESSALON) 100 MG capsule, Take 2 capsules (200 mg total) by mouth 3 (three) times daily as needed for cough. (Patient not taking: Reported on 01/07/2020), Disp: 21 capsule, Rfl: 0 .  HYDROcodone-acetaminophen (NORCO) 5-325 MG tablet, Take 2 tablets by mouth every 4 (four) hours as needed for moderate pain. (Patient not taking: Reported on 01/07/2020), Disp: 20 tablet, Rfl: 0 .  methocarbamol (ROBAXIN) 500 MG tablet, Take 1 tablet (500 mg total) by mouth 2 (two) times daily. (Patient not taking: Reported on 01/07/2020), Disp: 20 tablet, Rfl: 0 .  metoCLOPramide (REGLAN) 5 MG tablet, Take 1 tablet (5 mg total) by mouth 4 (four) times daily -  before meals and at bedtime., Disp: 45 tablet, Rfl: 3 .  oxyCODONE-acetaminophen (PERCOCET/ROXICET) 5-325 MG tablet, Take 2 tablets by mouth every 4 (four) hours as needed for severe pain. (Patient not taking: Reported on 01/07/2020), Disp: 15 tablet, Rfl: 0 .  predniSONE (DELTASONE) 10 MG tablet, Take 4 po qd x 3 days then 3 po qd x 3 days then 2 po qd x 3days then 1 po qd x 3 days then stop (Patient not taking: Reported on 01/07/2020), Disp: 30 tablet, Rfl: 0 .  predniSONE (DELTASONE) 10 MG  tablet, Sig: 4 tables once a day for 3 days, 3 tablets once a day X3 days, 2 tablets a day for 3 days, 1 tablet a day for 3 days. Disp 30/10 mg (Patient not taking: Reported on 01/07/2020), Disp: 30 tablet, Rfl: 0 .  predniSONE (DELTASONE) 20 MG tablet, Take 3 tabs po on first day, 2 tabs second day, 2 tabs third day, 1 tab fourth day, 1 tab 5th day. Take with food. (Patient not taking: Reported on 01/07/2020), Disp: 9 tablet, Rfl: 0   Review of Systems    Constitutional: Negative for activity change, appetite change, chills and fever.  HENT: Negative for congestion, nosebleeds, trouble swallowing and voice change.     Respiratory: Negative for cough, shortness of breath and wheezing.   Cardiac:  Negative for chest pain, pressure, syncope  Gastrointestinal: Negative for diarrhea, nausea and vomiting.  Genitourinary: Negative for difficulty urinating, dysuria, flank pain and hematuria.  Musculoskeletal: Negative for back pain, joint swelling and neck pain.  Neurological: Negative for dizziness, speech difficulty, light-headedness and numbness.  See HPI. All other review of systems negative.     Physical Exam:    height is 5\' 4"  (1.626 m) and weight is 231 lb 3.2 oz (104.9 kg). Her temporal temperature is 97.3 F (36.3 C) (abnormal). Her blood pressure is 120/67 and her pulse is 107 (abnormal). Her respiration is 17 and oxygen saturation is 96%.   Physical Examination: General appearance - alert, well appearing, and in no distress and oriented to person, place, and time Mental status - normal mood, behavior, speech, dress, motor activity, and thought processes Eyes - PERRL. Extraocular movements intact.  No nystagmus.  Neck - supple, no significant adenopathy, carotids upstroke normal bilaterally, no bruits, thyroid exam: thyroid is normal in size without nodules or tenderness Chest - clear to auscultation, no wheezes, rales or rhonchi, symmetric air entry  Heart - normal rate, regular rhythm, normal S1, S2, no murmurs, rubs, clicks or gallops Extremities - dependent LE edema without clubbing or cyanosis Skin - normal coloration and turgor, no rashes, no suspicious skin lesions noted  No hyperpigmentation of skin.  No current hematomas noted   Lab /Imaging Review   No lab work available  Assessment & Plan:  Angela Wilson is a 53 y.o. female    1. Type 2 diabetes mellitus with hyperglycemia, with long-term current use of insulin ([place])   2. Need for Tdap vaccination   3. Encounter for screening mammogram for malignant neoplasm of breast   4. Screening for colon cancer   5. Pancreatic mass     Orders Placed This Encounter  Procedures  . Comprehensive metabolic panel  . Ambulatory referral to Ophthalmology  . Ambulatory referral to General Surgery  . POCT glucose (manual entry)  . POCT glycosylated hemoglobin (Hb A1C)  . HM Diabetes Foot Exam   Meds ordered this encounter  Medications  . metoCLOPramide (REGLAN) 5 MG tablet    Sig: Take 1 tablet (5 mg total) by mouth 4 (four) times daily -  before meals and at bedtime.    Dispense:  45 tablet    Refill:  3   Orders Placed This Encounter  Procedures  . Comprehensive metabolic panel  . Ambulatory referral to Ophthalmology  . Ambulatory referral to General Surgery  . POCT glucose (manual entry)  . POCT glycosylated hemoglobin (Hb A1C)  . HM Diabetes Foot Exam     Glyn Ade, NP

## 2020-01-09 ENCOUNTER — Telehealth: Payer: Self-pay

## 2020-01-09 ENCOUNTER — Telehealth: Payer: Self-pay | Admitting: Adult Health Nurse Practitioner

## 2020-01-09 NOTE — Telephone Encounter (Signed)
Tried calling but they have closed for the day will call again tomorrow

## 2020-01-09 NOTE — Telephone Encounter (Signed)
Pt has urgent referral for [facility]. They updated Prof[facility] will following message.   "Referring office updated that we are needing further info regarding dx before being able to schedule. "   Please advise.

## 2020-01-09 NOTE — Telephone Encounter (Signed)
Attempted to call pt need to know what [facility] to that found her pancreatic mass. Trying to complete pts surgical referral.

## 2020-01-09 NOTE — Telephone Encounter (Signed)
PT daughter called back she states that the [facility] was in [place]

## 2020-01-09 NOTE — Telephone Encounter (Signed)
No additional notes required  

## 2020-01-10 NOTE — Telephone Encounter (Signed)
[facility] refused my verbal request for records on this pt and stated they had to have a signed release. Unsure when we will get the records to be able to have her referral done. I did inform their medical records team that it is urgent

## 2020-01-14 NOTE — Telephone Encounter (Signed)
Pt's daughter called requesting update on status of referral

## 2020-01-15 ENCOUNTER — Encounter: Payer: Self-pay | Admitting: Adult Health Nurse Practitioner

## 2020-01-15 ENCOUNTER — Other Ambulatory Visit: Payer: Self-pay | Admitting: Adult Health Nurse Practitioner

## 2020-01-15 DIAGNOSIS — K8689 Other specified diseases of pancreas: Secondary | ICD-10-CM

## 2020-01-15 NOTE — Progress Notes (Signed)
Reviewed care everywhere and recent notes that I can now access:    T2hyperintense, T1 hypointense, ill-defined hypoenhancing mass of the pancreatic tail approximately 3.4 cm and concerning for adenocarcinoma  12/31/19:  Date of study

## 2020-01-16 ENCOUNTER — Encounter: Payer: Self-pay | Admitting: Adult Health Nurse Practitioner

## 2020-01-24 ENCOUNTER — Encounter: Payer: Self-pay | Admitting: *Deleted

## 2020-01-24 NOTE — Progress Notes (Signed)
Spoke to patient's daughter, Rehab.  Reached out to Angela Wilson to introduce myself as the office RN Navigator and explain our new patient process. Reviewed the reason for their referral and scheduled their new patient appointment along with labs. Provided address and directions to the office including call back phone number. Reviewed with patient any concerns they may have or any possible barriers to attending their appointment.   Informed patient about my role as a navigator and that I will meet with them prior to their New Patient appointment and more fully discuss what services I can provide. At this time patient has no further questions or needs.   Letter with calendar, map and navigator intro mailed to patient home.

## 2020-02-03 ENCOUNTER — Inpatient Hospital Stay (HOSPITAL_BASED_OUTPATIENT_CLINIC_OR_DEPARTMENT_OTHER): Payer: 59 | Admitting: Hematology & Oncology

## 2020-02-03 ENCOUNTER — Encounter: Payer: Self-pay | Admitting: Hematology & Oncology

## 2020-02-03 ENCOUNTER — Inpatient Hospital Stay: Payer: 59 | Attending: Hematology & Oncology

## 2020-02-03 ENCOUNTER — Other Ambulatory Visit: Payer: Self-pay | Admitting: *Deleted

## 2020-02-03 ENCOUNTER — Encounter: Payer: Self-pay | Admitting: *Deleted

## 2020-02-03 ENCOUNTER — Other Ambulatory Visit: Payer: Self-pay

## 2020-02-03 VITALS — BP 136/71 | HR 106 | Temp 97.1°F | Resp 18 | Wt 226.2 lb

## 2020-02-03 DIAGNOSIS — K8689 Other specified diseases of pancreas: Secondary | ICD-10-CM

## 2020-02-03 DIAGNOSIS — C252 Malignant neoplasm of tail of pancreas: Secondary | ICD-10-CM

## 2020-02-03 DIAGNOSIS — R109 Unspecified abdominal pain: Secondary | ICD-10-CM | POA: Insufficient documentation

## 2020-02-03 DIAGNOSIS — C259 Malignant neoplasm of pancreas, unspecified: Secondary | ICD-10-CM | POA: Insufficient documentation

## 2020-02-03 DIAGNOSIS — R1084 Generalized abdominal pain: Secondary | ICD-10-CM | POA: Diagnosis not present

## 2020-02-03 DIAGNOSIS — K2101 Gastro-esophageal reflux disease with esophagitis, with bleeding: Secondary | ICD-10-CM

## 2020-02-03 HISTORY — DX: Malignant neoplasm of pancreas, unspecified: C25.9

## 2020-02-03 LAB — IRON AND TIBC
Iron: 23 ug/dL — ABNORMAL LOW (ref 28–170)
Saturation Ratios: 7 % — ABNORMAL LOW (ref 10.4–31.8)
TIBC: 325 ug/dL (ref 250–450)
UIBC: 302 ug/dL

## 2020-02-03 LAB — CBC WITH DIFFERENTIAL (CANCER CENTER ONLY)
Abs Immature Granulocytes: 0.05 10*3/uL (ref 0.00–0.07)
Basophils Absolute: 0 10*3/uL (ref 0.0–0.1)
Basophils Relative: 0 %
Eosinophils Absolute: 0.4 10*3/uL (ref 0.0–0.5)
Eosinophils Relative: 5 %
HCT: 37.1 % (ref 36.0–46.0)
Hemoglobin: 12.1 g/dL (ref 12.0–15.0)
Immature Granulocytes: 1 %
Lymphocytes Relative: 15 %
Lymphs Abs: 1.2 10*3/uL (ref 0.7–4.0)
MCH: 25.9 pg — ABNORMAL LOW (ref 26.0–34.0)
MCHC: 32.6 g/dL (ref 30.0–36.0)
MCV: 79.3 fL — ABNORMAL LOW (ref 80.0–100.0)
Monocytes Absolute: 0.4 10*3/uL (ref 0.1–1.0)
Monocytes Relative: 5 %
Neutro Abs: 6.1 10*3/uL (ref 1.7–7.7)
Neutrophils Relative %: 74 %
Platelet Count: 345 10*3/uL (ref 150–400)
RBC: 4.68 MIL/uL (ref 3.87–5.11)
RDW: 13.1 % (ref 11.5–15.5)
WBC Count: 8.1 10*3/uL (ref 4.0–10.5)
nRBC: 0 % (ref 0.0–0.2)

## 2020-02-03 LAB — CMP (CANCER CENTER ONLY)
ALT: 9 U/L (ref 0–44)
AST: 7 U/L — ABNORMAL LOW (ref 15–41)
Albumin: 3.9 g/dL (ref 3.5–5.0)
Alkaline Phosphatase: 62 U/L (ref 38–126)
Anion gap: 9 (ref 5–15)
BUN: 8 mg/dL (ref 6–20)
CO2: 27 mmol/L (ref 22–32)
Calcium: 10 mg/dL (ref 8.9–10.3)
Chloride: 97 mmol/L — ABNORMAL LOW (ref 98–111)
Creatinine: 0.56 mg/dL (ref 0.44–1.00)
GFR, Est AFR Am: >60 mL/min (ref >60)
GFR, Estimated: >60 mL/min (ref >60)
Glucose, Bld: 295 mg/dL — ABNORMAL HIGH (ref 70–99)
Potassium: 4.3 mmol/L (ref 3.5–5.1)
Sodium: 133 mmol/L — ABNORMAL LOW (ref 135–145)
Total Bilirubin: 0.5 mg/dL (ref 0.3–1.2)
Total Protein: 7.6 g/dL (ref 6.5–8.1)

## 2020-02-03 LAB — LACTATE DEHYDROGENASE: LDH: 98 U/L (ref 98–192)

## 2020-02-03 LAB — FERRITIN: Ferritin: 102 ng/mL (ref 11–307)

## 2020-02-03 LAB — PREALBUMIN: Prealbumin: 10.4 mg/dL — ABNORMAL LOW (ref 18–38)

## 2020-02-03 MED ORDER — PANCRELIPASE (LIP-PROT-AMYL) 36000-114000 UNITS PO CPEP: 72000.0000 [IU] | ORAL_CAPSULE | Freq: Three times a day (TID) | ORAL | 4 refills | Status: DC

## 2020-02-03 NOTE — Progress Notes (Signed)
Initial RN Navigator Patient Visit  Name: Angela Wilson Date of Referral : 01/24/2020 Diagnosis: Pancreatic Cancer  Met with patient prior to their visit with MD. Hanley Seamen patient "Your Patient Navigator" handout which explains my role, areas in which I am able to help, and all the contact information for myself and the office. Also gave patient MD and Navigator business card. Reviewed with patient the general overview of expected course after initial diagnosis and time frame for all steps to be completed.  Patient's daughter, Rehab, providing translation services  New patient packet given to patient which includes: orientation to office and staff; campus directory; education on My Chart and Advance Directives; and patient centered education on pancreatic cancer.  Patient completed visit with Dr. Marin Olp  After MD visit. Patient will need surgical referral to Dr Barry Dienes. Order placed. Will follow to ensure scheduling.  Patient understands all follow up procedures and expectations. They have my number to reach out for any further clarification or additional needs.

## 2020-02-03 NOTE — Progress Notes (Signed)
Referral MD  Reason for Referral: Adenocarcinoma of the pancreatic tail  No chief complaint on file. : Patient only speaks Arabic  HPI: Angela Wilson is a very nice 54 year old [name] woman.  She actually lives in [place].  Her oldest daughter lives in [place].  She comes and visits.  She apparently had been admitted to [facility] recently.  She was there because of abdominal pain.  She also was noted to have exacerbation of her diabetes.  She has had diabetes for many years.  She never has been on insulin.  She actually underwent a CT scan at [facility].  This was done on April 10.  This showed a 3.6 x 3.2 mass in the tail of the pancreas.  Everything else looked fine.  There is no liver metastasis.  Lymph nodes were not enlarged.  She then underwent a MRI of the pancreas on April 13.  This confirmed a mass in the tail the pancreas that measured 3.4 x 2.6 cm.  Again there is no liver masses.  Spleen was not enlarged.  There is no enlarged lymph nodes.  Vascular structures all appeared unremarkable.  She then underwent upper endoscopy with EUS.  This was done on May 10.  The pathology report ([id number]) showed adenocarcinoma.  Because of insurance issues, she needed to come locally.  As such, she was kindly referred to the [place].  She has not lost weight.  She has chronic abdominal issues.  She has had multiple abdominal surgeries.  She has had 4 C-sections.  She had her gallbladder taken out.  She has had her appendix taken out.  All of this was in [place].  I suspect she probably has scar tissue.  She has had a decent appetite.  She has had no nausea or vomiting.  She said that she does get full easily.  She has had no diarrhea.  There is been no leg swelling.  She has had no rashes.  She does not smoke.  She does not drink.  There is no obvious family history of malignancy.  Overall, her performance status is ECOG 1.    Past Medical History:   Diagnosis Date  . Diabetes mellitus without complication ([place])   . GERD (gastroesophageal reflux disease)   . Hypertension   . Kidney stone   . Shingles   :  Past Surgical History:  Procedure Laterality Date  . CESAREAN SECTION     x 4  . KIDNEY STONE SURGERY    :   Current Outpatient Medications:  .  albuterol (PROVENTIL HFA;VENTOLIN HFA) 108 (90 Base) MCG/ACT inhaler, Inhale 2 puffs into the lungs every 4 (four) hours as needed for wheezing or shortness of breath., Disp: 1 Inhaler, Rfl: 0 .  amLODipine (NORVASC) 5 MG tablet, Take 1 tablet (5 mg total) by mouth daily., Disp: 30 tablet, Rfl: 2 .  aspirin 81 MG chewable tablet, Chew 81 mg by mouth daily., Disp: , Rfl:  .  azithromycin (ZITHROMAX) 250 MG tablet, Take 2 po first day and then one po qd x 4 days (Patient not taking: Reported on 01/07/2020), Disp: 6 tablet, Rfl: 0 .  benzonatate (TESSALON) 100 MG capsule, Take 2 capsules (200 mg total) by mouth 3 (three) times daily as needed for cough. (Patient not taking: Reported on 01/07/2020), Disp: 21 capsule, Rfl: 0 .  cetirizine (ZYRTEC) 10 MG tablet, Take 10 mg by mouth daily., Disp: , Rfl:  .  HYDROcodone-acetaminophen (NORCO) 5-325 MG tablet,  Take 2 tablets by mouth every 4 (four) hours as needed for moderate pain. (Patient not taking: Reported on 01/07/2020), Disp: 20 tablet, Rfl: 0 .  ibuprofen (ADVIL,MOTRIN) 600 MG tablet, Take 1 tablet (600 mg total) by mouth every 8 (eight) hours as needed for pain (take with food on stomach). For pain, Disp: 20 tablet, Rfl: 0 .  metFORMIN (GLUCOPHAGE XR) 500 MG 24 hr tablet, Take 1 tablet (500 mg total) by mouth daily with breakfast., Disp: 30 tablet, Rfl: 1 .  methocarbamol (ROBAXIN) 500 MG tablet, Take 1 tablet (500 mg total) by mouth 2 (two) times daily. (Patient not taking: Reported on 01/07/2020), Disp: 20 tablet, Rfl: 0 .  metoCLOPramide (REGLAN) 5 MG tablet, Take 1 tablet (5 mg total) by mouth 4 (four) times daily -  before meals and at  bedtime., Disp: 45 tablet, Rfl: 3 .  omeprazole (PRILOSEC) 20 MG capsule, Take 1 capsule (20 mg total) by mouth 2 (two) times daily., Disp: 60 capsule, Rfl: 2 .  oxyCODONE-acetaminophen (PERCOCET/ROXICET) 5-325 MG tablet, Take 2 tablets by mouth every 4 (four) hours as needed for severe pain. (Patient not taking: Reported on 01/07/2020), Disp: 15 tablet, Rfl: 0 .  predniSONE (DELTASONE) 10 MG tablet, Take 4 po qd x 3 days then 3 po qd x 3 days then 2 po qd x 3days then 1 po qd x 3 days then stop (Patient not taking: Reported on 01/07/2020), Disp: 30 tablet, Rfl: 0 .  predniSONE (DELTASONE) 10 MG tablet, Sig: 4 tables once a day for 3 days, 3 tablets once a day X3 days, 2 tablets a day for 3 days, 1 tablet a day for 3 days. Disp 30/10 mg (Patient not taking: Reported on 01/07/2020), Disp: 30 tablet, Rfl: 0 .  predniSONE (DELTASONE) 20 MG tablet, Take 3 tabs po on first day, 2 tabs second day, 2 tabs third day, 1 tab fourth day, 1 tab 5th day. Take with food. (Patient not taking: Reported on 01/07/2020), Disp: 9 tablet, Rfl: 0:  :  Allergies  Allergen Reactions  . Morphine And Related Hives  . Penicillins Rash  :  History reviewed. No pertinent family history.:  Social History   Socioeconomic History  . Marital status: Divorced    Spouse name: Not on file  . Number of children: Not on file  . Years of education: Not on file  . Highest education level: Not on file  Occupational History  . Not on file  Tobacco Use  . Smoking status: Never Smoker  . Smokeless tobacco: Never Used  Substance and Sexual Activity  . Alcohol use: No  . Drug use: Never  . Sexual activity: Not on file  Other Topics Concern  . Not on file  Social History Narrative  . Not on file   Social Determinants of Health   Financial Resource Strain:   . Difficulty of Paying Living Expenses:   Food Insecurity:   . Worried About Charity fundraiser in the Last Year:   . Arboriculturist in the Last Year:    Transportation Needs:   . Film/video editor (Medical):   Marland Kitchen Lack of Transportation (Non-Medical):   Physical Activity:   . Days of Exercise per Week:   . Minutes of Exercise per Session:   Stress:   . Feeling of Stress :   Social Connections:   . Frequency of Communication with Friends and Family:   . Frequency of Social Gatherings with Friends and Family:   .  Attends Religious Services:   . Active Member of Clubs or Organizations:   . Attends Archivist Meetings:   Marland Kitchen Marital Status:   Intimate Partner Violence:   . Fear of Current or Ex-Partner:   . Emotionally Abused:   Marland Kitchen Physically Abused:   . Sexually Abused:   :  Review of Systems  Constitutional: Negative.   HENT: Negative.   Eyes: Negative.   Respiratory: Negative.   Cardiovascular: Negative.   Gastrointestinal: Positive for abdominal pain.  Genitourinary: Negative.   Musculoskeletal: Negative.   Skin: Negative.   Neurological: Negative.   Endo/Heme/Allergies: Negative.   Psychiatric/Behavioral: Negative.      Exam: This is a well-developed well-nourished Arabic woman in no obvious distress.  Again she speaks no [place].  She comes with her daughter who is very fluent in [place] and is a very good interpreter.  Her vital signs show a temperature of 97.1.  Pulse 106.  Blood pressure 136/71.  Weight is 226 pounds.  Head and neck exam shows no scleral icterus.  There is no oral lesions.  She has no obvious adenopathy in the neck.  Her thyroid is nonpalpable.  Lungs are clear bilaterally.  Cardiac exam regular rate and rhythm with no murmurs, rubs or bruits.  Abdomen is obese but soft.  She has multiple laparotomy scars.  She has decent bowel sounds.  There is some slight tenderness in the hypoumbilical region.  There is no fluid wave.  There is no palpable liver or spleen tip.  Back exam shows no tenderness over the spine, ribs or hips.  Extremities shows no clubbing, cyanosis or edema.  Skin exam shows no  rashes, ecchymoses or petechia.  Neurological exam is nonfocal.    @IPVITALS @   Recent Labs    02/03/20 1057  WBC 8.1  HGB 12.1  HCT 37.1  PLT 345   Recent Labs    02/03/20 1057  NA 133*  K 4.3  CL 97*  CO2 27  GLUCOSE 295*  BUN 8  CREATININE 0.56  CALCIUM 10.0    Blood smear review: None  Pathology: See above    Assessment and Plan: Angela Wilson is a very charming 54 year old [place] woman from [place].  She appears to have a solitary pancreatic mass.  This is pancreatic adenocarcinoma.  Clinically, this looks like a stage Ib (T2N0M0) pancreatic cancer.  I really believe that this is a surgical issue.  I do not see that there is any indication for neoadjuvant therapy.  She will NOT take any chemotherapy.  I think that she is in good shape to be able to tolerate surgery.  I would think that she would need a partial pancreatectomy.  We will make a referral to our surgical oncologist, Dr. Barry Dienes, who I am sure will be able to see her quickly and make recommendations with respect to surgery.  Again, I do not see any role for neoadjuvant therapy.  The patient will not take any chemotherapy whether it be neoadjuvant or adjuvant.  She does seem to be in decent shape.  I really think that surgery would be reasonable for her.  Now I do understand that when surgery is done, there might be metastatic disease that we just cannot see on the scans or feel on examination.  We will see what her CA 19-9 level is.  She would like to go back to the [place] after her surgery.  Again I do not think this would be a problem.  I  told her that the surgeon would let her know when it would be okay [place]  I spent about an hour with Angela Wilson and her daughter.  Her daughter really did a great job in interpreting and translating for me.  Actually, another daughter, who is a doctor in [place], was on the cell phone and I answered her questions.  I think  that would be reasonable to have Angela Wilson on some Creon.  This might help with some of the fullness that she feels when she eats.  I figure that I will see her when she has her surgery in the hospital.

## 2020-02-04 LAB — CANCER ANTIGEN 19-9: CA 19-9: 2 U/mL (ref 0–35)

## 2020-02-06 ENCOUNTER — Ambulatory Visit: Payer: Self-pay | Admitting: Adult Health Nurse Practitioner

## 2020-02-06 ENCOUNTER — Encounter: Payer: Self-pay | Admitting: *Deleted

## 2020-02-06 NOTE — Progress Notes (Signed)
Patient originally had appointment with Dr Barry Dienes on 02/18/2020 however I reached to their office to see if this could be moved up. Received notification from Dr Marlowe Aschoff office that they had a cancellation and patient would be seen Friday (02/07/2020).   Called Rehab to confirm that they had received the information, and she had.   She also notifies this office that patient stopped her Creon on Wednesday as it was causing n/v. Message sent to Dr Marin Olp to inform him of this med change.

## 2020-02-10 ENCOUNTER — Other Ambulatory Visit: Payer: Self-pay

## 2020-02-10 ENCOUNTER — Ambulatory Visit: Payer: Self-pay | Admitting: Hematology & Oncology

## 2020-02-11 ENCOUNTER — Encounter: Payer: Self-pay | Admitting: Hematology & Oncology

## 2020-02-13 ENCOUNTER — Other Ambulatory Visit: Payer: Self-pay

## 2020-02-13 ENCOUNTER — Encounter: Payer: Self-pay | Admitting: *Deleted

## 2020-02-13 ENCOUNTER — Inpatient Hospital Stay (HOSPITAL_BASED_OUTPATIENT_CLINIC_OR_DEPARTMENT_OTHER)
Admission: EM | Admit: 2020-02-13 | Discharge: 2020-02-18 | DRG: 357 | Disposition: A | Discharge status: Home / Self Care | Payer: 59 | Attending: Internal Medicine | Admitting: Internal Medicine

## 2020-02-13 ENCOUNTER — Emergency Department (HOSPITAL_BASED_OUTPATIENT_CLINIC_OR_DEPARTMENT_OTHER): Payer: 59

## 2020-02-13 DIAGNOSIS — C801 Malignant (primary) neoplasm, unspecified: Secondary | ICD-10-CM

## 2020-02-13 DIAGNOSIS — Z91041 Radiographic dye allergy status: Secondary | ICD-10-CM | POA: Diagnosis not present

## 2020-02-13 DIAGNOSIS — I10 Essential (primary) hypertension: Secondary | ICD-10-CM | POA: Diagnosis present

## 2020-02-13 DIAGNOSIS — G893 Neoplasm related pain (acute) (chronic): Secondary | ICD-10-CM | POA: Diagnosis present

## 2020-02-13 DIAGNOSIS — Z7984 Long term (current) use of oral hypoglycemic drugs: Secondary | ICD-10-CM

## 2020-02-13 DIAGNOSIS — Z79891 Long term (current) use of opiate analgesic: Secondary | ICD-10-CM

## 2020-02-13 DIAGNOSIS — Z880 Allergy status to penicillin: Secondary | ICD-10-CM | POA: Diagnosis not present

## 2020-02-13 DIAGNOSIS — C786 Secondary malignant neoplasm of retroperitoneum and peritoneum: Secondary | ICD-10-CM | POA: Diagnosis present

## 2020-02-13 DIAGNOSIS — J90 Pleural effusion, not elsewhere classified: Secondary | ICD-10-CM

## 2020-02-13 DIAGNOSIS — E871 Hypo-osmolality and hyponatremia: Secondary | ICD-10-CM | POA: Diagnosis present

## 2020-02-13 DIAGNOSIS — R1011 Right upper quadrant pain: Secondary | ICD-10-CM | POA: Diagnosis present

## 2020-02-13 DIAGNOSIS — E1169 Type 2 diabetes mellitus with other specified complication: Secondary | ICD-10-CM | POA: Diagnosis not present

## 2020-02-13 DIAGNOSIS — Z7982 Long term (current) use of aspirin: Secondary | ICD-10-CM | POA: Diagnosis not present

## 2020-02-13 DIAGNOSIS — E119 Type 2 diabetes mellitus without complications: Secondary | ICD-10-CM | POA: Diagnosis present

## 2020-02-13 DIAGNOSIS — R1900 Intra-abdominal and pelvic swelling, mass and lump, unspecified site: Secondary | ICD-10-CM | POA: Diagnosis not present

## 2020-02-13 DIAGNOSIS — C259 Malignant neoplasm of pancreas, unspecified: Secondary | ICD-10-CM | POA: Diagnosis present

## 2020-02-13 DIAGNOSIS — K219 Gastro-esophageal reflux disease without esophagitis: Secondary | ICD-10-CM | POA: Diagnosis present

## 2020-02-13 DIAGNOSIS — J939 Pneumothorax, unspecified: Secondary | ICD-10-CM

## 2020-02-13 DIAGNOSIS — E785 Hyperlipidemia, unspecified: Secondary | ICD-10-CM | POA: Diagnosis present

## 2020-02-13 DIAGNOSIS — Z79899 Other long term (current) drug therapy: Secondary | ICD-10-CM | POA: Diagnosis not present

## 2020-02-13 DIAGNOSIS — Z20822 Contact with and (suspected) exposure to covid-19: Secondary | ICD-10-CM | POA: Diagnosis present

## 2020-02-13 DIAGNOSIS — C252 Malignant neoplasm of tail of pancreas: Secondary | ICD-10-CM

## 2020-02-13 DIAGNOSIS — Z885 Allergy status to narcotic agent status: Secondary | ICD-10-CM | POA: Diagnosis not present

## 2020-02-13 DIAGNOSIS — C7989 Secondary malignant neoplasm of other specified sites: Secondary | ICD-10-CM | POA: Diagnosis not present

## 2020-02-13 HISTORY — DX: Hyperlipidemia, unspecified: E78.5

## 2020-02-13 HISTORY — DX: Type 2 diabetes mellitus without complications: E11.9

## 2020-02-13 LAB — CBC WITH DIFFERENTIAL/PLATELET
Abs Immature Granulocytes: 0.04 10*3/uL (ref 0.00–0.07)
Basophils Absolute: 0.1 10*3/uL (ref 0.0–0.1)
Basophils Relative: 1 %
Eosinophils Absolute: 0.5 10*3/uL (ref 0.0–0.5)
Eosinophils Relative: 5 %
HCT: 40.5 % (ref 36.0–46.0)
Hemoglobin: 12.9 g/dL (ref 12.0–15.0)
Immature Granulocytes: 0 %
Lymphocytes Relative: 21 %
Lymphs Abs: 1.9 10*3/uL (ref 0.7–4.0)
MCH: 25.4 pg — ABNORMAL LOW (ref 26.0–34.0)
MCHC: 31.9 g/dL (ref 30.0–36.0)
MCV: 79.9 fL — ABNORMAL LOW (ref 80.0–100.0)
Monocytes Absolute: 0.5 10*3/uL (ref 0.1–1.0)
Monocytes Relative: 5 %
Neutro Abs: 6.2 10*3/uL (ref 1.7–7.7)
Neutrophils Relative %: 68 %
Platelets: 469 10*3/uL — ABNORMAL HIGH (ref 150–400)
RBC: 5.07 MIL/uL (ref 3.87–5.11)
RDW: 13.5 % (ref 11.5–15.5)
WBC: 9.1 10*3/uL (ref 4.0–10.5)
nRBC: 0 % (ref 0.0–0.2)

## 2020-02-13 LAB — COMPREHENSIVE METABOLIC PANEL
ALT: 15 U/L (ref 0–44)
AST: 14 U/L — ABNORMAL LOW (ref 15–41)
Albumin: 3.7 g/dL (ref 3.5–5.0)
Alkaline Phosphatase: 49 U/L (ref 38–126)
Anion gap: 12 (ref 5–15)
BUN: 11 mg/dL (ref 6–20)
CO2: 22 mmol/L (ref 22–32)
Calcium: 9.2 mg/dL (ref 8.9–10.3)
Chloride: 99 mmol/L (ref 98–111)
Creatinine, Ser: 0.45 mg/dL (ref 0.44–1.00)
GFR calc Af Amer: >60 mL/min (ref >60)
GFR calc non Af Amer: >60 mL/min (ref >60)
Glucose, Bld: 168 mg/dL — ABNORMAL HIGH (ref 70–99)
Potassium: 4.5 mmol/L (ref 3.5–5.1)
Sodium: 133 mmol/L — ABNORMAL LOW (ref 135–145)
Total Bilirubin: 0.3 mg/dL (ref 0.3–1.2)
Total Protein: 8.5 g/dL — ABNORMAL HIGH (ref 6.5–8.1)

## 2020-02-13 LAB — SARS CORONAVIRUS 2 BY RT PCR (HOSPITAL ORDER, PERFORMED IN ~~LOC~~ HOSPITAL LAB): SARS Coronavirus 2: NEGATIVE

## 2020-02-13 LAB — URINALYSIS, ROUTINE W REFLEX MICROSCOPIC
Bilirubin Urine: NEGATIVE
Glucose, UA: NEGATIVE mg/dL
Ketones, ur: NEGATIVE mg/dL
Leukocytes,Ua: NEGATIVE
Nitrite: NEGATIVE
Protein, ur: NEGATIVE mg/dL
Specific Gravity, Urine: >1.03 — ABNORMAL HIGH (ref 1.005–1.030)
pH: 5.5 (ref 5.0–8.0)

## 2020-02-13 LAB — URINALYSIS, MICROSCOPIC (REFLEX)

## 2020-02-13 LAB — LIPASE, BLOOD: Lipase: 22 U/L (ref 11–51)

## 2020-02-13 LAB — PREGNANCY, URINE: Preg Test, Ur: NEGATIVE

## 2020-02-13 MED ORDER — FENTANYL CITRATE (PF) 100 MCG/2ML IJ SOLN
50.0000 ug | INTRAMUSCULAR | Status: AC | PRN
  Administered 2020-02-13 – 2020-02-15 (×4): 50 ug via INTRAVENOUS
  Filled 2020-02-13 (×4): qty 2

## 2020-02-13 MED ORDER — PANTOPRAZOLE SODIUM 40 MG PO TBEC
40.0000 mg | DELAYED_RELEASE_TABLET | Freq: Every day | ORAL | Status: DC
  Administered 2020-02-14 – 2020-02-18 (×5): 40 mg via ORAL
  Filled 2020-02-13 (×5): qty 1

## 2020-02-13 MED ORDER — SODIUM CHLORIDE 0.9 % IV SOLN: Freq: Once | INTRAVENOUS | Status: AC

## 2020-02-13 MED ORDER — ONDANSETRON HCL 4 MG/2ML IJ SOLN
4.0000 mg | Freq: Once | INTRAMUSCULAR | Status: AC
  Administered 2020-02-13: 4 mg via INTRAVENOUS
  Filled 2020-02-13: qty 2

## 2020-02-13 MED ORDER — ASPIRIN EC 81 MG PO TBEC
81.0000 mg | DELAYED_RELEASE_TABLET | Freq: Every day | ORAL | Status: DC
  Administered 2020-02-14 – 2020-02-18 (×5): 81 mg via ORAL
  Filled 2020-02-13 (×5): qty 1

## 2020-02-13 MED ORDER — FENTANYL CITRATE (PF) 100 MCG/2ML IJ SOLN
50.0000 ug | Freq: Once | INTRAMUSCULAR | Status: AC
  Administered 2020-02-13: 50 ug via INTRAVENOUS
  Filled 2020-02-13: qty 2

## 2020-02-13 MED ORDER — AMLODIPINE BESYLATE 10 MG PO TABS: 10.0000 mg | ORAL_TABLET | Freq: Every day | ORAL | Status: DC

## 2020-02-13 MED ORDER — LISINOPRIL 10 MG PO TABS: 10.0000 mg | ORAL_TABLET | Freq: Every day | ORAL | Status: DC

## 2020-02-13 MED ORDER — LACTATED RINGERS IV BOLUS
1000.0000 mL | Freq: Once | INTRAVENOUS | Status: AC
  Administered 2020-02-13: 1000 mL via INTRAVENOUS

## 2020-02-13 NOTE — Care Plan (Signed)
54 yo F hx of pancreatic ca presented w abdominal pain CT abd showing progression of mass and mets and 8 cm periumbilical mass Er spoke to Dr. Ermelinda Das They rec admit for symptom control Will oncology consult in AM will Covid pending Mild cough Have been vaccinated Hypotensive  Accepted to stepdown Inpatient Daril Warga 7:19 PM

## 2020-02-13 NOTE — ED Triage Notes (Signed)
Pt arrives during downtime, see all downtime forms.   Per triage note on downtime forms, pt arrives with daughter with c/o abdominal pain and recent pancreatic cancer diagnosis.

## 2020-02-13 NOTE — ED Provider Notes (Signed)
[place] EMERGENCY DEPARTMENT Provider Note   CSN: [id number] Arrival date & time: 02/13/20  1222     History Chief Complaint  Patient presents with  . Abdominal Pain    Grettel [name] is a 54 y.o. female with a past medical history of diabetes, hypertension, pancreatic cancer diagnosed approximately 1 month ago who presents today for evaluation of worsening abdominal pain.  History is obtained from patient, her daughter, and chart review.  Patient only speaks Arabic and is from [place].  During initial interview she refused medical translator wishing for her daughter to translate, however forgetting results used a professional Arabic speaking medical interpreter.  Patient reports that her abdominal pain is continuing to worsen.  She states that she has 2 kinds of pains, she has a pain in her upper abdomen whenever she eats and feels nauseous.  She had been tried on Creon however reports that made her symptoms worse and stopped.  She has been seen by Dr. Marin Olp in the office with plans for obtaining CT scan on her chest and Dr. Barry Dienes for a partial pancreectomy.  Her scans from [facility] in April show a localized mass in the tail of the pancreas without evidence of spread.     She states that she comes in today as the pain is now keeping her awake at night.  Her overall pain is in her entire abdomen worse in the left periumbilical area.  She reports feelings of bloating and being full.  Her last bowel movement was within the past 24 hours and was normal.  She denies any urinary symptoms.  No fevers.  Her home hydrocodone provides moderate relief of symptoms.  HPI    Patient Active Problem List   Diagnosis Date Noted  . 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   OB History   No obstetric history on file.     No family history on file.  Social History   Tobacco Use  . Smoking status: Not on file  Substance Use Topics  . Alcohol use: Not on file  . Drug use: Not on file    Home Medications Prior to Admission medications   Medication Sig Start Date End Date Taking? Authorizing Provider  amLODipine (NORVASC) 10 MG tablet Take 10 mg by mouth daily. 12/27/19   [provider]  ergocalciferol (VITAMIN D2) 1.25 MG (50000 UT) capsule Take 50,000 Int'l Units by mouth once a week. 08/25/19   [provider]  glipiZIDE (GLUCOTROL) 10 MG tablet Take 10 mg by mouth 2 (two) times daily. 08/25/19   [provider]  HYDROcodone-acetaminophen (NORCO/VICODIN) 5-325 MG tablet Take 1 tablet by mouth every 6 (six) hours as needed. 01/31/20   [provider]  lisinopril (ZESTRIL) 10 MG tablet Take 10 mg by mouth daily. 12/27/19   [provider]  omeprazole (PRILOSEC) 40 MG capsule Take 40 mg by mouth daily. 08/20/19   [provider]  pravastatin (PRAVACHOL) 40 MG tablet Take 40 mg by mouth daily. 12/27/19   [provider]    Allergies    Ivp dye [iodinated diagnostic agents], Morphine and related, and Penicillins  Review of Systems   Review of Systems  Constitutional: Negative for chills and fever.  Eyes: Negative for visual disturbance.  Respiratory: Negative for chest tightness  and shortness of breath.   Cardiovascular: Negative for chest pain.  Gastrointestinal: Positive for abdominal pain and nausea. Negative for constipation and diarrhea.  Genitourinary: Negative for dysuria.  Musculoskeletal: Negative for back pain and neck pain.  Skin: Negative for color change and rash.  Neurological: Negative for weakness and headaches.  Psychiatric/Behavioral: Negative for confusion.  All other systems reviewed and are negative.   Physical  Exam Updated Vital Signs BP 104/66   Pulse 88   Temp 98.4 F (36.9 C) (Oral)   Resp 18   SpO2 97%   Physical Exam Vitals and nursing note reviewed.  Constitutional:      General: She is not in acute distress.    Appearance: She is well-developed. She is obese. She is not diaphoretic.  HENT:     Head: Normocephalic and atraumatic.  Eyes:     General: No scleral icterus.       Right eye: No discharge.        Left eye: No discharge.     Conjunctiva/sclera: Conjunctivae normal.  Cardiovascular:     Rate and Rhythm: Normal rate and regular rhythm.  Pulmonary:     Effort: Pulmonary effort is normal. No respiratory distress.     Breath sounds: No stridor. Examination of the right-lower field reveals rales. Examination of the left-lower field reveals rales. Rales present.  Abdominal:     General: A surgical scar is present. Bowel sounds are normal. There is no distension.     Palpations: Abdomen is soft.     Tenderness: There is abdominal tenderness.     Hernia: No hernia is present.     Comments: Multiple surgical scars across the abdomen.  Abdominal palpation causes pain in the left periumbilical area.  There is a palpable firmness in this area, however remainder of abdomen is soft without noted distention or fluid wave.  Musculoskeletal:        General: No deformity.     Cervical back: Normal range of motion.  Skin:    General: Skin is warm and dry.  Neurological:     Mental Status: She is alert.     Motor: No abnormal muscle tone.  Psychiatric:        Behavior: Behavior normal.     ED Results / Procedures / Treatments   Labs (all labs ordered are listed, but only abnormal results are displayed) Labs Reviewed  COMPREHENSIVE METABOLIC PANEL - Abnormal; Notable for the following components:      Result Value   Sodium 133 (*)    Glucose, Bld 168 (*)    Total Protein 8.5 (*)    AST 14 (*)    All other components within normal limits  CBC WITH DIFFERENTIAL/PLATELET -  Abnormal; Notable for the following components:   MCV 79.9 (*)    MCH 25.4 (*)    Platelets 469 (*)    All other components within normal limits  URINALYSIS, ROUTINE W REFLEX MICROSCOPIC - Abnormal; Notable for the following components:   APPearance CLOUDY (*)    Specific Gravity, Urine >1.030 (*)    Hgb urine dipstick TRACE (*)    All other components within normal limits  URINALYSIS, MICROSCOPIC (REFLEX) - Abnormal; Notable for the following components:   Bacteria, UA MANY (*)    All other components within normal limits  SARS CORONAVIRUS 2 BY RT PCR ([facility])  LIPASE, BLOOD  PREGNANCY, URINE    EKG None   Radiology  CT ABDOMEN PELVIS WO CONTRAST  Result Date: 02/13/2020 CLINICAL DATA:  Persistent cough. Known pancreatic cancer. Abdominal pain. EXAM: CT CHEST, ABDOMEN AND PELVIS WITHOUT CONTRAST TECHNIQUE: Multidetector CT imaging of the chest, abdomen and pelvis was performed following the standard protocol without IV contrast. COMPARISON:  None. FINDINGS: CT CHEST FINDINGS Cardiovascular: The heart is normal in size. No pericardial effusion. The aorta is normal in caliber. Minimal scattered aortic calcifications. Mediastinum/Nodes: Scattered mediastinal lymph nodes without obvious mass or adenopathy. Small epicardial nodes are noted. The esophagus is grossly normal. The thyroid gland is grossly normal. Lungs/Pleura: Moderate-sized right pleural effusion is noted. There is moderate overlying atelectasis. No definite pulmonary nodules to suggest pulmonary metastatic disease. Moderate eventration of the right hemidiaphragm with some overlying vascular crowding and atelectasis. Musculoskeletal: No breast masses, supraclavicular or axillary adenopathy. The bony thorax is intact. No obvious bone lesions. CT ABDOMEN PELVIS FINDINGS Hepatobiliary: No obvious hepatic lesions are identified without contrast. Pancreas: 5.5 x 3.9 cm soft tissue mass is  noted in the pancreatic tail region. Spleen: Normal size.  No focal lesions. Adrenals/Urinary Tract: Adrenal glands and kidneys are grossly normal. The bladder is unremarkable. Stomach/Bowel: The stomach, duodenum, small bowel and colon are grossly normal. No obvious inflammatory process, mass lesions or obstructive findings. Some high attenuation material is noted in the cecum and could be related to ingested material or prior scan. Vascular/Lymphatic: The aorta is normal in caliber. No atherosclerotic calcifications. Small scattered mesenteric and retroperitoneal lymph nodes are noted. Unfortunately, there is extensive omental disease and peritoneal implants in the abdomen and upper pelvis. Numerous soft tissue masses are noted with a large conglomerate mass noted anteriorly at the level of the umbilicus which measures a maximum of 8 cm. Small amount of free pelvic fluid is noted. There are small pelvic sidewall lymph nodes. Reproductive: No significant findings. Other: No inguinal mass or adenopathy.  No subcutaneous lesions. Musculoskeletal: No significant bony findings. No worrisome bone lesions. 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 IMPRESSION: 1. 5.5 x 3.9 cm pancreatic tail mass. 2. 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 3. Moderate-sized right pleural effusion and overlying atelectasis. 4. No definite findings for pulmonary metastatic disease. Electronically Signed   By: Marijo Sanes M.D.   On: 02/13/2020 17:04   CT Chest Wo Contrast  Result Date: 02/13/2020 CLINICAL DATA:  Persistent cough. Known pancreatic cancer. Abdominal pain. EXAM: CT CHEST, ABDOMEN AND PELVIS WITHOUT CONTRAST TECHNIQUE: Multidetector CT imaging of the chest, abdomen and pelvis was performed following the standard protocol without IV contrast. COMPARISON:  None. FINDINGS: CT CHEST FINDINGS Cardiovascular: The heart is normal in size. No  pericardial effusion. The aorta is normal in caliber. Minimal scattered aortic calcifications. Mediastinum/Nodes: Scattered mediastinal lymph nodes without obvious mass or adenopathy. Small epicardial nodes are noted. The esophagus is grossly normal. The thyroid gland is grossly normal. Lungs/Pleura: Moderate-sized right pleural effusion is noted. There is moderate overlying atelectasis. No definite pulmonary nodules to suggest pulmonary metastatic disease. Moderate eventration of the right hemidiaphragm with some overlying vascular crowding and atelectasis. Musculoskeletal: No breast masses, supraclavicular or axillary adenopathy. The bony thorax is intact. No obvious bone lesions. CT ABDOMEN PELVIS FINDINGS Hepatobiliary: No obvious hepatic lesions are identified without contrast. Pancreas: 5.5 x 3.9 cm soft tissue mass is noted in the pancreatic tail region. Spleen: Normal size.  No focal lesions. Adrenals/Urinary Tract: Adrenal glands and kidneys are grossly normal. The bladder is  unremarkable. Stomach/Bowel: The stomach, duodenum, small bowel and colon are grossly normal. No obvious inflammatory process, mass lesions or obstructive findings. Some high attenuation material is noted in the cecum and could be related to ingested material or prior scan. Vascular/Lymphatic: The aorta is normal in caliber. No atherosclerotic calcifications. Small scattered mesenteric and retroperitoneal lymph nodes are noted. Unfortunately, there is extensive omental disease and peritoneal implants in the abdomen and upper pelvis. Numerous soft tissue masses are noted with a large conglomerate mass noted anteriorly at the level of the umbilicus which measures a maximum of 8 cm. Small amount of free pelvic fluid is noted. There are small pelvic sidewall lymph nodes. Reproductive: No significant findings. Other: No inguinal mass or adenopathy.  No subcutaneous lesions. Musculoskeletal: No significant bony findings. No worrisome bone  lesions. 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 IMPRESSION: 1. 5.5 x 3.9 cm pancreatic tail mass. 2. 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 3. Moderate-sized right pleural effusion and overlying atelectasis. 4. No definite findings for pulmonary metastatic disease. Electronically Signed   By: Marijo Sanes M.D.   On: 02/13/2020 17:04     CLINICAL DATA: Abdominal pain, pancreatic mass  EXAM: MRI ABDOMEN WITHOUT AND WITH CONTRAST  TECHNIQUE: Multiplanar multisequence MR imaging of the abdomen was performed both before and after the administration of intravenous contrast.  CONTRAST: 10 mL Gadavist gadolinium contrast IV  COMPARISON: 12/28/2019  FINDINGS: Examination is significantly limited by extensive breath motion artifact throughout.  Lower chest: No acute findings.  Hepatobiliary: No mass or other parenchymal abnormality identified. Hepatic steatosis. Status post cholecystectomy.  Pancreas: There is a slightly T2 hyperintense, T1 hypointense, ill-defined hypoenhancing mass of the pancreatic tail measuring approximately 3.4 x 2.6 cm (series 15) image 32). Associated diffusion abnormality. No inflammatory changes of the adjacent retroperitoneum or other parenchymal abnormality identified.  Spleen: Within normal limits in size and appearance.  Adrenals/Urinary Tract: No masses identified. No evidence of hydronephrosis.  Stomach/Bowel: Visualized portions within the abdomen are unremarkable.  Vascular/Lymphatic: No pathologically enlarged lymph nodes identified. No abdominal aortic aneurysm demonstrated.  Other: None.  Musculoskeletal: No suspicious bone lesions identified.  IMPRESSION: 1. There is a slightly T2 hyperintense, T1 hypointense, ill-defined hypoenhancing mass of the pancreatic tail measuring approximately 3.4 cm, in keeping with prior CT and concerning for  adenocarcinoma, although difficult to unambiguously characterize due to intermediate signal and enhancement characteristics as well as extensive motion artifact seen throughout the examination. A hemorrhagic or proteinaceous acute pancreatic fluid collection could have this appearance, particularly in the setting of acute abdominal pain. Short interval follow-up multiphasic contrast enhanced CT may be less sensitive to motion artifact and helpful to more clearly assess.  2. No secondary findings of malignancy are appreciated in the abdomen such as lymphadenopathy or hepatic metastatic disease.  3. Hepatic steatosis. Procedures Procedures (including critical care time)  Medications Ordered in ED Medications  amLODipine (NORVASC) tablet 10 mg (has no administration in time range)  lisinopril (ZESTRIL) tablet 10 mg (has no administration in time range)  pantoprazole (PROTONIX) EC tablet 40 mg (has no administration in time range)  fentaNYL (SUBLIMAZE) injection 50 mcg (has no administration in time range)  aspirin EC tablet 81 mg (has no administration in time range)  fentaNYL (SUBLIMAZE) injection 50 mcg (50 mcg Intravenous Given 02/13/20 1844)  ondansetron (ZOFRAN) injection 4 mg (4 mg Intravenous Given 02/13/20 1844)  0.9 %  sodium chloride infusion (  Intravenous New Bag/Given 02/13/20 1842)  lactated ringers bolus 1,000 mL (0 mLs Intravenous Stopped 02/13/20 2126)    ED Course  I have reviewed the triage vital signs and the nursing notes.  Pertinent labs & imaging results that were available during my care of the patient were reviewed by me and considered in my medical decision making (see chart for details).  Clinical Course as of Feb 12 2337  Thu Feb 13, 2020  1446 [id number]   [EH]  1539 Urine is contaminated   Squamous Epithelial / LPF: 11-20 [EH]  1737 I spoke with Dr. Burr Medico, will possibly admit patient.    [EH]    Clinical Course User Index [EH] Ollen Gross   MDM Rules/Calculators/A&P                     Patient is a 54 year old Arabic speaking woman who presents today for evaluation of worsening abdominal pain.  She has known pancreatic cancer.  Her scans last month showed localized disease.  She has been seen by Dr. Marin Olp in the office with currently in the process of obtaining CT scan of the chest for possible partial pancreectomy with Dr. Barry Dienes.    On exam there is a palpable mass left of the umbilicus and palpation anywhere on the abdomen increases pain in the area of this mass.  She has multiple surgical scars from prior surgeries.    Labs are obtained and reviewed BC and CMP without significant acute hematologic or electrolyte derangements.  UA is significantly contaminated with 11-20 squamous epithelial cells, and she is not currently having any urinary symptoms.  T scan abdomen pelvis was obtained.  Scans were obtained without contrast that she has a reaction to CT dye.  CT scan did show concern for her enlarged pancreatic mass with extensive omental and peritoneal metastatic involvement of the upper abdomen and pelvis with a 8 cm mass to the left of the umbilicus which appears to correlate with the mass felt on exam.  I spoke with Dr. Annamaria Boots, on-call for oncology.  Is also discussed with patient, and with her permission her daughter in the room using a professional Arabic speaking medical interpreter.  Patient will be admitted to [facility] for further evaluation given the new pleural effusion, her pain not being controlled at home, and the significant interval change in her CT scan and symptoms which I suspect will require a significant change in her plan.  Patient's pain and nausea were treated in the emergency room with fentanyl and Zofran.  Hospitalist is consulted, I spoke with Dr. Roel Cluck who placed orders for admission.   Note: Portions of this report may have been transcribed using voice recognition software. Every effort  was made to ensure accuracy; however, inadvertent computerized transcription errors may be present   Final Clinical Impression(s) / ED Diagnoses Final diagnoses:  Primary pancreatic cancer with metastasis to other site [facility])  Pleural effusion    Rx / DC Orders ED Discharge Orders    None       Ollen Gross 02/13/20 2338    Isla Pence, MD 02/19/20 1712

## 2020-02-14 ENCOUNTER — Inpatient Hospital Stay (HOSPITAL_COMMUNITY): Payer: 59

## 2020-02-14 ENCOUNTER — Encounter (HOSPITAL_COMMUNITY): Payer: Self-pay | Admitting: Internal Medicine

## 2020-02-14 ENCOUNTER — Encounter: Payer: Self-pay | Admitting: Hematology & Oncology

## 2020-02-14 DIAGNOSIS — R1900 Intra-abdominal and pelvic swelling, mass and lump, unspecified site: Secondary | ICD-10-CM

## 2020-02-14 DIAGNOSIS — J90 Pleural effusion, not elsewhere classified: Secondary | ICD-10-CM

## 2020-02-14 DIAGNOSIS — C259 Malignant neoplasm of pancreas, unspecified: Secondary | ICD-10-CM

## 2020-02-14 DIAGNOSIS — C786 Secondary malignant neoplasm of retroperitoneum and peritoneum: Principal | ICD-10-CM

## 2020-02-14 DIAGNOSIS — G893 Neoplasm related pain (acute) (chronic): Secondary | ICD-10-CM

## 2020-02-14 LAB — URINALYSIS, ROUTINE W REFLEX MICROSCOPIC
Bilirubin Urine: NEGATIVE
Glucose, UA: NEGATIVE mg/dL
Hgb urine dipstick: NEGATIVE
Ketones, ur: NEGATIVE mg/dL
Leukocytes,Ua: NEGATIVE
Nitrite: NEGATIVE
Protein, ur: NEGATIVE mg/dL
Specific Gravity, Urine: 1.009 (ref 1.005–1.030)
pH: 5 (ref 5.0–8.0)

## 2020-02-14 LAB — CBC
HCT: 38.1 % (ref 36.0–46.0)
Hemoglobin: 11.6 g/dL — ABNORMAL LOW (ref 12.0–15.0)
MCH: 25.3 pg — ABNORMAL LOW (ref 26.0–34.0)
MCHC: 30.4 g/dL (ref 30.0–36.0)
MCV: 83.2 fL (ref 80.0–100.0)
Platelets: 379 10*3/uL (ref 150–400)
RBC: 4.58 MIL/uL (ref 3.87–5.11)
RDW: 13.5 % (ref 11.5–15.5)
WBC: 8.4 10*3/uL (ref 4.0–10.5)
nRBC: 0 % (ref 0.0–0.2)

## 2020-02-14 LAB — BASIC METABOLIC PANEL
Anion gap: 11 (ref 5–15)
BUN: 9 mg/dL (ref 6–20)
CO2: 22 mmol/L (ref 22–32)
Calcium: 8.6 mg/dL — ABNORMAL LOW (ref 8.9–10.3)
Chloride: 104 mmol/L (ref 98–111)
Creatinine, Ser: 0.4 mg/dL — ABNORMAL LOW (ref 0.44–1.00)
GFR calc Af Amer: >60 mL/min (ref >60)
GFR calc non Af Amer: >60 mL/min (ref >60)
Glucose, Bld: 159 mg/dL — ABNORMAL HIGH (ref 70–99)
Potassium: 3.8 mmol/L (ref 3.5–5.1)
Sodium: 137 mmol/L (ref 135–145)

## 2020-02-14 LAB — PROTIME-INR
INR: 1.2 (ref 0.8–1.2)
Prothrombin Time: 14.4 seconds (ref 11.4–15.2)

## 2020-02-14 LAB — TSH: TSH: 2.028 u[IU]/mL (ref 0.350–4.500)

## 2020-02-14 LAB — HIV ANTIBODY (ROUTINE TESTING W REFLEX): HIV Screen 4th Generation wRfx: NONREACTIVE

## 2020-02-14 MED ORDER — HEPARIN SODIUM (PORCINE) 5000 UNIT/ML IJ SOLN
5000.0000 [IU] | Freq: Three times a day (TID) | INTRAMUSCULAR | Status: DC
  Administered 2020-02-14: 5000 [IU] via SUBCUTANEOUS
  Filled 2020-02-14: qty 1

## 2020-02-14 MED ORDER — MIDAZOLAM HCL 2 MG/2ML IJ SOLN
INTRAMUSCULAR | Status: AC
  Filled 2020-02-14: qty 4

## 2020-02-14 MED ORDER — ALBUTEROL SULFATE (2.5 MG/3ML) 0.083% IN NEBU: 2.5000 mg | INHALATION_SOLUTION | RESPIRATORY_TRACT | Status: DC | PRN

## 2020-02-14 MED ORDER — HEPARIN SODIUM (PORCINE) 5000 UNIT/ML IJ SOLN
5000.0000 [IU] | Freq: Three times a day (TID) | INTRAMUSCULAR | Status: DC
  Administered 2020-02-15 – 2020-02-17 (×9): 5000 [IU] via SUBCUTANEOUS
  Filled 2020-02-14 (×11): qty 1

## 2020-02-14 MED ORDER — HYDROCODONE-ACETAMINOPHEN 10-325 MG PO TABS
1.0000 | ORAL_TABLET | Freq: Four times a day (QID) | ORAL | Status: DC | PRN
  Administered 2020-02-14 – 2020-02-18 (×13): 1 via ORAL
  Filled 2020-02-14 (×14): qty 1

## 2020-02-14 MED ORDER — ONDANSETRON HCL 4 MG/2ML IJ SOLN: 4.0000 mg | Freq: Four times a day (QID) | INTRAMUSCULAR | Status: DC | PRN

## 2020-02-14 MED ORDER — FENTANYL CITRATE (PF) 100 MCG/2ML IJ SOLN
INTRAMUSCULAR | Status: AC
  Filled 2020-02-14: qty 2

## 2020-02-14 MED ORDER — ONDANSETRON HCL 4 MG PO TABS
4.0000 mg | ORAL_TABLET | Freq: Four times a day (QID) | ORAL | Status: DC | PRN
  Administered 2020-02-17: 4 mg via ORAL
  Filled 2020-02-14: qty 1

## 2020-02-14 MED ORDER — LIDOCAINE HCL (PF) 1 % IJ SOLN
INTRAMUSCULAR | Status: AC | PRN
  Administered 2020-02-14: 10 mL

## 2020-02-14 MED ORDER — SODIUM CHLORIDE 0.9 % IV SOLN
INTRAVENOUS | Status: AC
  Administered 2020-02-14: 900 mL via INTRAVENOUS

## 2020-02-14 MED ORDER — MIDAZOLAM HCL 2 MG/2ML IJ SOLN
INTRAMUSCULAR | Status: AC | PRN
  Administered 2020-02-14 (×2): 1 mg via INTRAVENOUS

## 2020-02-14 MED ORDER — FENTANYL CITRATE (PF) 100 MCG/2ML IJ SOLN
INTRAMUSCULAR | Status: AC | PRN
  Administered 2020-02-14 (×2): 50 ug via INTRAVENOUS

## 2020-02-14 NOTE — Consult Note (Signed)
Referral MD  Reason for Referral: Pancreatic cancer-possible metastatic  Chief Complaint  Patient presents with  . Abdominal Pain  : Patient speaks no English.  She is from [place].  HPI: [name] is well-known to me.  I saw her back on May 17.  She is a very nice 54 year old woman from [place].  She does not speak [place].  When I saw her in the office, her daughter was with her and was a wonderful interpreter.  At the time that I saw her, there is no evidence of metastatic disease.  Her CA 19-9 was 2.  We got her over to surgery to consider resection.  She apparently showed up in the emergency room at [facility] on 02/13/2020.  She is having some abdominal pain.  She had lab work done.  This really did not look all that remarkable.  Her blood sugar was on the high side at 168.  Her liver function studies were normal.  Her white cell count was 9.1 with hemoglobin of 12.9.  Unfortunately, CAT scans showed a different story.  CAT scans done on 02/13/2020 showed a left pleural effusion.  She had extensive omental disease as well as peritoneal carcinomatosis.  She had a mass at the level of the umbilicus measuring 8 cm.  I am absolutely stunned by this.  I just saw her about 11 days ago when she looked great.  There is no obvious evidence of metastatic disease at that time.  She is clearly going to need a biopsy.  We will have to see what the CA 19-9 shows.  She has this right pleural effusion.  This will probably have to be taken off.  History reviewed. No pertinent past medical history.:  History reviewed. No pertinent surgical history.:   Current Facility-Administered Medications:  .  0.9 %  sodium chloride infusion, , Intravenous, Continuous, Myles Rosenthal A, MD, Last Rate: 75 mL/hr at 02/14/20 0231, 900 mL at 02/14/20 0231 .  albuterol (PROVENTIL) (2.5 MG/3ML) 0.083% nebulizer solution 2.5 mg, 2.5 mg, Nebulization, Q2H PRN, Myles Rosenthal A, MD .  amLODipine  (NORVASC) tablet 10 mg, 10 mg, Oral, Daily, Wyn Quaker W, PA-C .  aspirin EC tablet 81 mg, 81 mg, Oral, Daily, Wyn Quaker W, PA-C .  fentaNYL (SUBLIMAZE) injection 50 mcg, 50 mcg, Intravenous, Q2H PRN, Lorin Glass, PA-C, 50 mcg at 02/14/20 [id number] .  heparin injection 5,000 Units, 5,000 Units, Subcutaneous, Q8H, Clance Boll, MD, 5,000 Units at 02/14/20 [id number] .  HYDROcodone-acetaminophen (NORCO) 10-325 MG per tablet 1 tablet, 1 tablet, Oral, Q6H PRN, Myles Rosenthal A, MD .  lisinopril (ZESTRIL) tablet 10 mg, 10 mg, Oral, Daily, Wyn Quaker W, PA-C .  ondansetron [facility]) tablet 4 mg, 4 mg, Oral, Q6H PRN **OR** ondansetron (ZOFRAN) injection 4 mg, 4 mg, Intravenous, Q6H PRN, Myles Rosenthal A, MD .  pantoprazole (PROTONIX) EC tablet 40 mg, 40 mg, Oral, Daily, Wyn Quaker W, PA-C:  . amLODipine  10 mg Oral Daily  . aspirin EC  81 mg Oral Daily  . heparin  5,000 Units Subcutaneous Q8H  . lisinopril  10 mg Oral Daily  . pantoprazole  40 mg Oral Daily  :  Allergies  Allergen Reactions  . Ivp Dye [Iodinated Diagnostic Agents] Other (See Comments)    unk  . Morphine And Related Other (See Comments)    unk  . Penicillins Other (See Comments)    unk  :  History reviewed. No pertinent family history.:  Social History   Socioeconomic History  . Marital status: Divorced    Spouse name: Not on file  . Number of children: Not on file  . Years of education: Not on file  . Highest education level: Not on file  Occupational History  . Not on file  Tobacco Use  . Smoking status: Not on file  Substance and Sexual Activity  . Alcohol use: Not on file  . Drug use: Not on file  . Sexual activity: Not on file  Other Topics Concern  . Not on file  Social History Narrative  . Not on file   Social Determinants of Health   Financial Resource Strain:   . Difficulty of Paying Living Expenses:   Food Insecurity:   . Worried About Sales executive in the Last Year:   . Arboriculturist in the Last Year:   Transportation Needs:   . Film/video editor (Medical):   Marland Kitchen Lack of Transportation (Non-Medical):   Physical Activity:   . Days of Exercise per Week:   . Minutes of Exercise per Session:   Stress:   . Feeling of Stress :   Social Connections:   . Frequency of Communication with Friends and Family:   . Frequency of Social Gatherings with Friends and Family:   . Attends Religious Services:   . Active Member of Clubs or Organizations:   . Attends Archivist Meetings:   Marland Kitchen Marital Status:   Intimate Partner Violence:   . Fear of Current or Ex-Partner:   . Emotionally Abused:   Marland Kitchen Physically Abused:   . Sexually Abused:   :  Review of Systems  Constitutional: Positive for malaise/fatigue.  HENT: Negative.   Eyes: Negative.   Respiratory: Positive for cough and shortness of breath.   Cardiovascular: Negative.   Gastrointestinal: Positive for abdominal pain.  Genitourinary: Negative.   Musculoskeletal: Negative.   Skin: Negative.   Neurological: Negative.   Endo/Heme/Allergies: Negative.   Psychiatric/Behavioral: Negative.      Exam:    This is a fairly well-developed well-nourished [place] woman.  Vital signs are temperature 98.3.  Pulse 101.  Blood pressure 118/85.  Her head neck exam shows no scleral icterus.  There is no adenopathy in the neck.  Lungs are relatively clear bilaterally.  There may be some slight decrease around the right side.  No wheezes are noted.  Cardiac exam regular rate and rhythm.  Abdomen is soft.  She is slightly obese.  She has no obvious fluid wave.  There is some fullness about the umbilicus.  Extremities shows no clubbing, cyanosis or edema.  Neurological exam shows no focal neurological deficits.  Skin exam is without rashes, ecchymoses or petechia.  Patient Vitals for the past 24 hrs:  BP Temp Temp src Pulse Resp SpO2  02/14/20 0633 118/85 98.3 F (36.8 C) Oral (!) 101  18 97 %  02/14/20 0145 127/73 98.3 F (36.8 C) Oral 86 - 100 %  02/14/20 0035 - - - 85 17 97 %  02/14/20 0030 111/72 98.2 F (36.8 C) Oral 83 17 91 %  02/14/20 0000 116/66 - - 84 18 93 %  02/13/20 2330 110/84 - - 81 19 96 %  02/13/20 2302 104/66 - - 88 18 97 %  02/13/20 2230 139/76 - - 94 18 95 %  02/13/20 2130 112/67 - - 88 (!) 22 97 %  02/13/20 2128 (!) 114/52 - - 91 19 98 %  02/13/20 2010 - - - 90 (!) 23 96 %  02/13/20 1942 113/67 98.4 F (36.9 C) Oral 92 18 98 %  02/13/20 1942 - - - 96 18 96 %  02/13/20 1941 - - - 97 - -  02/13/20 1846 108/62 - - 100 18 98 %  02/13/20 1739 100/61 - - 96 16 98 %  02/13/20 1508 111/64 98.2 F (36.8 C) Oral 88 16 98 %  02/13/20 1445 116/74 - - 91 16 96 %  02/13/20 1225 114/68 98.5 F (36.9 C) Oral (!) 106 18 98 %     Recent Labs    02/13/20 1400 02/14/20 0416  WBC 9.1 8.4  HGB 12.9 11.6*  HCT 40.5 38.1  PLT 469* 379   Recent Labs    02/13/20 1400 02/14/20 0416  NA 133* 137  K 4.5 3.8  CL 99 104  CO2 22 22  GLUCOSE 168* 159*  BUN 11 9  CREATININE 0.45 0.40*  CALCIUM 9.2 8.6*    Blood smear review: None  Pathology: Pending    Assessment and Plan: [name] is a 54 year old Arabic woman.  She is from [place].  She certainly looks like as she has metastatic pancreatic cancer.  I am just very surprised by this.  Again we just checked a CA 19-9 on her recently.  It was only 2.  I just have a hard time believing that this cancer has recurred so rapidly.  I definitely think she is going to need a biopsy so we can prove that she has metastatic disease.  The problem though is that she will not take chemotherapy.  She admitted that probably clear when we saw her on May 17.  I understand this.  I am sure that is cultural to some degree.  I just feel bad for her.  She is so nice.  Again, we really need tissue to approve that she has metastatic disease.  I suspect she probably will also need a thoracentesis to remove the  right pleural effusion.  This can also be checked for malignancy.  We will follow her along.  At some point, I will have to talk to her daughter who does speak [place] and see what we can do to try to help.  Lattie Haw, MD  Darlyn Chamber 29:11

## 2020-02-14 NOTE — Progress Notes (Signed)
Chief Complaint: Patient was seen in consultation today for biopsy and thoracentesis  Referring Physician(s): Dr. Marin Olp  Supervising Physician: Corrie Mckusick  Patient Status: [facility] - In-pt  History of Present Illness: Angela Wilson is a 54 y.o. female admitted with abd pain. She is from [place] and can understand some English but does not speak it. Her daughter is at bedside and can interpret well. She is found to have pancreatic mass with peritoneal lesions and right pleural effusion. Oncology has requested image guided biopsy of the peritoneal nodules and (R)thoracentesis. PMHx, meds, labs, imaging, allergies reviewed. Feels well, no recent fevers, chills, illness. Has been NPO today. Family at bedside.   History reviewed. No pertinent past medical history.  History reviewed. No pertinent surgical history.  Allergies: Ivp dye [iodinated diagnostic agents], Morphine and related, and Penicillins  Medications:  Current Facility-Administered Medications:  .  0.9 %  sodium chloride infusion, , Intravenous, Continuous, Myles Rosenthal A, MD, Last Rate: 75 mL/hr at 02/14/20 0231, 900 mL at 02/14/20 0231 .  albuterol (PROVENTIL) (2.5 MG/3ML) 0.083% nebulizer solution 2.5 mg, 2.5 mg, Nebulization, Q2H PRN, Myles Rosenthal A, MD .  aspirin EC tablet 81 mg, 81 mg, Oral, Daily, Wyn Quaker W, PA-C .  fentaNYL (SUBLIMAZE) injection 50 mcg, 50 mcg, Intravenous, Q2H PRN, Lorin Glass, PA-C, 50 mcg at 02/14/20 [id number] .  [START ON 02/15/2020] heparin injection 5,000 Units, 5,000 Units, Subcutaneous, Q8H, Saleemah Mollenhauer, PA-C .  HYDROcodone-acetaminophen (NORCO) 10-325 MG per tablet 1 tablet, 1 tablet, Oral, Q6H PRN, Myles Rosenthal A, MD .  ondansetron (ZOFRAN) tablet 4 mg, 4 mg, Oral, Q6H PRN **OR** ondansetron (ZOFRAN) injection 4 mg, 4 mg, Intravenous, Q6H PRN, Myles Rosenthal A, MD .  pantoprazole (PROTONIX) EC tablet 40 mg, 40 mg, Oral, Daily, Lorin Glass, PA-C    History reviewed. No pertinent family history.  Social History   Socioeconomic History  . Marital status: Divorced    Spouse name: Not on file  . Number of children: Not on file  . Years of education: Not on file  . Highest education level: Not on file  Occupational History  . Not on file  Tobacco Use  . Smoking status: Not on file  Substance and Sexual Activity  . Alcohol use: Not on file  . Drug use: Not on file  . Sexual activity: Not on file  Other Topics Concern  . Not on file  Social History Narrative  . Not on file   Social Determinants of Health   Financial Resource Strain:   . Difficulty of Paying Living Expenses:   Food Insecurity:   . Worried About Charity fundraiser in the Last Year:   . Arboriculturist in the Last Year:   Transportation Needs:   . Film/video editor (Medical):   Marland Kitchen Lack of Transportation (Non-Medical):   Physical Activity:   . Days of Exercise per Week:   . Minutes of Exercise per Session:   Stress:   . Feeling of Stress :   Social Connections:   . Frequency of Communication with Friends and Family:   . Frequency of Social Gatherings with Friends and Family:   . Attends Religious Services:   . Active Member of Clubs or Organizations:   . Attends Archivist Meetings:   Marland Kitchen Marital Status:      Review of Systems: A 12 point ROS discussed and pertinent positives are indicated in the HPI above.  All other systems  are negative.  Review of Systems  Vital Signs: BP 118/85 (BP Location: Right Arm)   Pulse (!) 101   Temp 98.3 F (36.8 C) (Oral)   Resp 18   SpO2 97%   Physical Exam Constitutional:      Appearance: She is well-developed. She is obese. She is not ill-appearing.  HENT:     Mouth/Throat:     Mouth: Mucous membranes are moist.     Pharynx: Oropharynx is clear.  Cardiovascular:     Rate and Rhythm: Normal rate and regular rhythm.     Heart sounds: Normal heart sounds.  Pulmonary:       Effort: Pulmonary effort is normal. No respiratory distress.     Breath sounds: Normal breath sounds.  Abdominal:     General: There is no distension.     Palpations: Abdomen is soft.     Tenderness: There is no abdominal tenderness.  Skin:    General: Skin is warm and dry.  Neurological:     General: No focal deficit present.     Mental Status: She is alert and oriented to person, place, and time.  Psychiatric:        Mood and Affect: Mood normal.        Thought Content: Thought content normal.        Judgment: Judgment normal.     Imaging: CT ABDOMEN PELVIS WO CONTRAST  Result Date: 02/13/2020 CLINICAL DATA:  Persistent cough. Known pancreatic cancer. Abdominal pain. EXAM: CT CHEST, ABDOMEN AND PELVIS WITHOUT CONTRAST TECHNIQUE: Multidetector CT imaging of the chest, abdomen and pelvis was performed following the standard protocol without IV contrast. COMPARISON:  None. FINDINGS: CT CHEST FINDINGS Cardiovascular: The heart is normal in size. No pericardial effusion. The aorta is normal in caliber. Minimal scattered aortic calcifications. Mediastinum/Nodes: Scattered mediastinal lymph nodes without obvious mass or adenopathy. Small epicardial nodes are noted. The esophagus is grossly normal. The thyroid gland is grossly normal. Lungs/Pleura: Moderate-sized right pleural effusion is noted. There is moderate overlying atelectasis. No definite pulmonary nodules to suggest pulmonary metastatic disease. Moderate eventration of the right hemidiaphragm with some overlying vascular crowding and atelectasis. Musculoskeletal: No breast masses, supraclavicular or axillary adenopathy. The bony thorax is intact. No obvious bone lesions. CT ABDOMEN PELVIS FINDINGS Hepatobiliary: No obvious hepatic lesions are identified without contrast. Pancreas: 5.5 x 3.9 cm soft tissue mass is noted in the pancreatic tail region. Spleen: Normal size.  No focal lesions. Adrenals/Urinary Tract: Adrenal glands and  kidneys are grossly normal. The bladder is unremarkable. Stomach/Bowel: The stomach, duodenum, small bowel and colon are grossly normal. No obvious inflammatory process, mass lesions or obstructive findings. Some high attenuation material is noted in the cecum and could be related to ingested material or prior scan. Vascular/Lymphatic: The aorta is normal in caliber. No atherosclerotic calcifications. Small scattered mesenteric and retroperitoneal lymph nodes are noted. Unfortunately, there is extensive omental disease and peritoneal implants in the abdomen and upper pelvis. Numerous soft tissue masses are noted with a large conglomerate mass noted anteriorly at the level of the umbilicus which measures a maximum of 8 cm. Small amount of free pelvic fluid is noted. There are small pelvic sidewall lymph nodes. Reproductive: No significant findings. Other: No inguinal mass or adenopathy.  No subcutaneous lesions. Musculoskeletal: No significant bony findings. No worrisome bone lesions. 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 IMPRESSION: 1. 5.5 x 3.9 cm pancreatic tail mass. 2. Extensive omental and  peritoneal carcinomatosis in the abdomen and upper pelvis. 3. Moderate-sized right pleural effusion and overlying atelectasis. 4. No definite findings for pulmonary metastatic disease. Electronically Signed   By: Marijo Sanes M.D.   On: 02/13/2020 17:04   CT Chest Wo Contrast  Result Date: 02/13/2020 CLINICAL DATA:  Persistent cough. Known pancreatic cancer. Abdominal pain. EXAM: CT CHEST, ABDOMEN AND PELVIS WITHOUT CONTRAST TECHNIQUE: Multidetector CT imaging of the chest, abdomen and pelvis was performed following the standard protocol without IV contrast. COMPARISON:  None. FINDINGS: CT CHEST FINDINGS Cardiovascular: The heart is normal in size. No pericardial effusion. The aorta is normal in caliber. Minimal scattered aortic calcifications. Mediastinum/Nodes: Scattered  mediastinal lymph nodes without obvious mass or adenopathy. Small epicardial nodes are noted. The esophagus is grossly normal. The thyroid gland is grossly normal. Lungs/Pleura: Moderate-sized right pleural effusion is noted. There is moderate overlying atelectasis. No definite pulmonary nodules to suggest pulmonary metastatic disease. Moderate eventration of the right hemidiaphragm with some overlying vascular crowding and atelectasis. Musculoskeletal: No breast masses, supraclavicular or axillary adenopathy. The bony thorax is intact. No obvious bone lesions. CT ABDOMEN PELVIS FINDINGS Hepatobiliary: No obvious hepatic lesions are identified without contrast. Pancreas: 5.5 x 3.9 cm soft tissue mass is noted in the pancreatic tail region. Spleen: Normal size.  No focal lesions. Adrenals/Urinary Tract: Adrenal glands and kidneys are grossly normal. The bladder is unremarkable. Stomach/Bowel: The stomach, duodenum, small bowel and colon are grossly normal. No obvious inflammatory process, mass lesions or obstructive findings. Some high attenuation material is noted in the cecum and could be related to ingested material or prior scan. Vascular/Lymphatic: The aorta is normal in caliber. No atherosclerotic calcifications. Small scattered mesenteric and retroperitoneal lymph nodes are noted. Unfortunately, there is extensive omental disease and peritoneal implants in the abdomen and upper pelvis. Numerous soft tissue masses are noted with a large conglomerate mass noted anteriorly at the level of the umbilicus which measures a maximum of 8 cm. Small amount of free pelvic fluid is noted. There are small pelvic sidewall lymph nodes. Reproductive: No significant findings. Other: No inguinal mass or adenopathy.  No subcutaneous lesions. Musculoskeletal: No significant bony findings. No worrisome bone lesions. 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 IMPRESSION: 1. 5.5 x 3.9 cm  pancreatic tail mass. 2. 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 3. Moderate-sized right pleural effusion and overlying atelectasis. 4. No definite findings for pulmonary metastatic disease. Electronically Signed   By: Marijo Sanes M.D.   On: 02/13/2020 17:04    Labs:  CBC: Recent Labs    02/13/20 1400 02/14/20 0416  WBC 9.1 8.4  HGB 12.9 11.6*  HCT 40.5 38.1  PLT 469* 379    COAGS: No results for input(s): INR, APTT in the last 8760 hours.  BMP: Recent Labs    02/13/20 1400 02/14/20 0416  NA 133* 137  K 4.5 3.8  CL 99 104  CO2 22 22  GLUCOSE 168* 159*  BUN 11 9  CALCIUM 9.2 8.6*  CREATININE 0.45 0.40*  GFRNONAA >60 >60  GFRAA >60 >60    LIVER FUNCTION TESTS: Recent Labs    02/13/20 1400  BILITOT 0.3  AST 14*  ALT 15  ALKPHOS 49  PROT 8.5*  ALBUMIN 3.7    TUMOR MARKERS: No results for input(s): AFPTM, CEA, CA199, CHROMGRNA in the last 8760 hours.  Assessment and Plan: Pancreatic mass with interval changes concerning for aggressive metastatic  spread. Imaging reviewed. Plans for image guided biopsy of peritoneal/omental nodule and (R)thoracentesis. Labs reviewed. Risks and benefits of biopsy was discussed with the patient and/or patient's family including, but not limited to bleeding, infection, damage to adjacent structures or low yield requiring additional tests.  All of the questions were answered and there is agreement to proceed.  Consent signed and in chart.    Thank you for this interesting consult.  I greatly enjoyed meeting Angela Wilson and look forward to participating in their care.  A copy of this report was sent to the requesting provider on this date.  Electronically Signed: Ascencion Dike, PA-C 02/14/2020, 9:37 AM   I spent a total of 25 minutes in face to face in clinical consultation, greater than 50% of which was counseling/coordinating care for pancreatic mass with peritoneal nodules  and (R)effusion

## 2020-02-14 NOTE — Progress Notes (Signed)
PROGRESS NOTE  Angela Wilson TWS:[id number] DOB: [birth year] DOA: 02/13/2020 PCP: No primary care provider on file.  HPI/Recap of past 24 hours: HPI from Dr Georgina Pillion is a 54 y.o. female with medical history significant for diabetes, hypertension, pancreatic cancer diagnosed approximately 1 one month ago with localized disease and is currently undergoing evaluation in preparation for possible partial pancreectomy with Dr. Barry Dienes. Patient now presents to the ed due to progressive abdominal pain that is keeping her awake at night. She notes pain in diffuse but more intense in lower abdomen and right upper quadrant portion of her abdomen she describe pain as in the skin and not deep pain. She notes minimal associated nausea with her symptoms and per daughter is tolerating po, although does have early satiety. Denies any other symptoms. In the ED, VSS, labs fairly unremarkable. CT abd/pelvis showed concern for enlarged pancreatic mass with extensive omental and peritoneal metastatic involvement of the upper abdomen and pelvis with a 8 cm mass to the left of the umbilicus, with pleural effusion. Case was discussed with Dr Annamaria Boots Yvonna Alanis, who recommended admission for pain control and further staging in order to clarify management, given progressive disease.    Today, pt reports abdominal pain is controlled, denies any other new complaints, denies any SOB, chest pain, N/V/D/F/C. Discussed with daughter extensively at bedside.     Assessment/Plan: Active Problems:   Cancer associated pain   Pancreatic cancer ([place])   ?Possible metastatic pancreatic cancer with intractable pain CT abdomen showed concern for enlarged pancreatic mass with extensive omental and peritoneal involvement, right-sided pleural effusion Oncology on board, appreciate recs IR on board, status post CT-guided biopsy of peritoneal mass, and status post ultrasound-guided right thoracentesis sent for cytology on  02/14/2020, await biopsy results IV fluids, pain management  Diabetes mellitus type 2 SSI, Accu-Cheks, hypoglycemic protocol Hold home oral hypoglycemics  Hypertension BP on the soft side Hold home lisinopril, amlodipine for now  HLD Continue pravastatin  GERD Continue PPI        Malnutrition Type:      Malnutrition Characteristics:      Nutrition Interventions:       There is no height or weight on file to calculate BMI.     Code Status: Full  Family Communication: Discussed with daughter at bedside  Disposition Plan: Status is: Inpatient  Remains inpatient appropriate because:Inpatient level of care appropriate due to severity of illness   Dispo: The patient is from: Home              Anticipated d/c is to: Home              Anticipated d/c date is: 1 day              Patient currently is not medically stable to d/c.    Consultants:  Oncology  IR  Procedures:  CT-guided biopsy peritoneal mass on 02/14/2020  Right thoracentesis on 02/14/2020  Antimicrobials:  None  DVT prophylaxis: SCDs   Objective: Vitals:   02/14/20 1159 02/14/20 1215 02/14/20 1220 02/14/20 1324  BP: 123/66 133/75 136/76 109/70  Pulse: 95 95 95 82  Resp: _0 Temp:    99 F (37.2 C)  TempSrc:    Oral  SpO2: 99% 99% 99% 98%    Intake/Output Summary (Last 24 hours) at 02/14/2020 1441 Last data filed at 02/14/2020 0017 Gross per 24 hour  Intake 261.25 ml  Output 300 ml  Net -38.75 ml  There were no vitals filed for this visit.  Exam:  General: NAD   Cardiovascular: S1, S2 present  Respiratory: CTAB  Abdomen: Soft, TTP around lower quadrant and right upper quadrant, nondistended, bowel sounds present  Musculoskeletal: No bilateral pedal edema noted  Skin: Normal  Psychiatry: Normal mood   Data Reviewed: CBC: Recent Labs  Lab 02/13/20 1400 02/14/20 0416  WBC 9.1 8.4  NEUTROABS 6.2  --   HGB 12.9 11.6*  HCT 40.5 38.1  MCV  79.9* 83.2  PLT 469* 737   Basic Metabolic Panel: Recent Labs  Lab 02/13/20 1400 02/14/20 0416  NA 133* 137  K 4.5 3.8  CL 99 104  CO2 22 22  GLUCOSE 168* 159*  BUN 11 9  CREATININE 0.45 0.40*  CALCIUM 9.2 8.6*   GFR: CrCl cannot be calculated (Unknown ideal weight.). Liver Function Tests: Recent Labs  Lab 02/13/20 1400  AST 14*  ALT 15  ALKPHOS 49  BILITOT 0.3  PROT 8.5*  ALBUMIN 3.7   Recent Labs  Lab 02/13/20 1400  LIPASE 22   No results for input(s): AMMONIA in the last 168 hours. Coagulation Profile: Recent Labs  Lab 02/14/20 1004  INR 1.2   Cardiac Enzymes: No results for input(s): CKTOTAL, CKMB, CKMBINDEX, TROPONINI in the last 168 hours. BNP (last 3 results) No results for input(s): PROBNP in the last 8760 hours. HbA1C: No results for input(s): HGBA1C in the last 72 hours. CBG: No results for input(s): GLUCAP in the last 168 hours. Lipid Profile: No results for input(s): CHOL, HDL, LDLCALC, TRIG, CHOLHDL, LDLDIRECT in the last 72 hours. Thyroid Function Tests: Recent Labs    02/14/20 0416  TSH 2.028   Anemia Panel: No results for input(s): VITAMINB12, FOLATE, FERRITIN, TIBC, IRON, RETICCTPCT in the last 72 hours. Urine analysis:    Component Value Date/Time   COLORURINE YELLOW 02/14/2020 0212   APPEARANCEUR CLEAR 02/14/2020 0212   LABSPEC 1.009 02/14/2020 0212   PHURINE 5.0 02/14/2020 0212   GLUCOSEU NEGATIVE 02/14/2020 0212   HGBUR NEGATIVE 02/14/2020 0212   BILIRUBINUR NEGATIVE 02/14/2020 0212   KETONESUR NEGATIVE 02/14/2020 0212   PROTEINUR NEGATIVE 02/14/2020 0212   NITRITE NEGATIVE 02/14/2020 0212   LEUKOCYTESUR NEGATIVE 02/14/2020 0212   Sepsis Labs: _0 (procalcitonin:4,lacticidven:4)  ) Recent Results (from the past 240 hour(s))  SARS Coronavirus 2 by RT PCR (hospital order, performed in [facility] hospital lab) Nasopharyngeal Nasopharyngeal Swab     Status: None   Collection Time: 02/13/20  6:35 PM   Specimen:  Nasopharyngeal Swab  Result Value Ref Range Status   SARS Coronavirus 2 NEGATIVE NEGATIVE Final    Comment: (NOTE) SARS-CoV-2 target nucleic acids are NOT DETECTED. The SARS-CoV-2 RNA is generally detectable in upper and lower respiratory specimens during the acute phase of infection. The lowest concentration of SARS-CoV-2 viral copies this assay can detect is 250 copies / mL. A negative result does not preclude SARS-CoV-2 infection and should not be used as the sole basis for treatment or other patient management decisions.  A negative result may occur with improper specimen collection / handling, submission of specimen other than nasopharyngeal swab, presence of viral mutation(s) within the areas targeted by this assay, and inadequate number of viral copies (<250 copies / mL). A negative result must be combined with clinical observations, patient history, and epidemiological information. Fact Sheet for Patients:   StrictlyIdeas.no Fact Sheet for Healthcare Providers: BankingDealers.co.za This test is not yet approved or cleared  by the [place] FDA  and has been authorized for detection and/or diagnosis of SARS-CoV-2 by FDA under an Emergency Use Authorization (EUA).  This EUA will remain in effect (meaning this test can be used) for the duration of the COVID-19 declaration under Section 564(b)(1) of the Act, 21 U.S.C. section 360bbb-3(b)(1), unless the authorization is terminated or revoked sooner. Performed at [facility], [address] [place], [place] [address]       Studies: CT ABDOMEN PELVIS WO CONTRAST  Result Date: 02/13/2020 CLINICAL DATA:  Persistent cough. Known pancreatic cancer. Abdominal pain. EXAM: CT CHEST, ABDOMEN AND PELVIS WITHOUT CONTRAST TECHNIQUE: Multidetector CT imaging of the chest, abdomen and pelvis was performed following the standard protocol without IV contrast. COMPARISON:  None.  FINDINGS: CT CHEST FINDINGS Cardiovascular: The heart is normal in size. No pericardial effusion. The aorta is normal in caliber. Minimal scattered aortic calcifications. Mediastinum/Nodes: Scattered mediastinal lymph nodes without obvious mass or adenopathy. Small epicardial nodes are noted. The esophagus is grossly normal. The thyroid gland is grossly normal. Lungs/Pleura: Moderate-sized right pleural effusion is noted. There is moderate overlying atelectasis. No definite pulmonary nodules to suggest pulmonary metastatic disease. Moderate eventration of the right hemidiaphragm with some overlying vascular crowding and atelectasis. Musculoskeletal: No breast masses, supraclavicular or axillary adenopathy. The bony thorax is intact. No obvious bone lesions. CT ABDOMEN PELVIS FINDINGS Hepatobiliary: No obvious hepatic lesions are identified without contrast. Pancreas: 5.5 x 3.9 cm soft tissue mass is noted in the pancreatic tail region. Spleen: Normal size.  No focal lesions. Adrenals/Urinary Tract: Adrenal glands and kidneys are grossly normal. The bladder is unremarkable. Stomach/Bowel: The stomach, duodenum, small bowel and colon are grossly normal. No obvious inflammatory process, mass lesions or obstructive findings. Some high attenuation material is noted in the cecum and could be related to ingested material or prior scan. Vascular/Lymphatic: The aorta is normal in caliber. No atherosclerotic calcifications. Small scattered mesenteric and retroperitoneal lymph nodes are noted. Unfortunately, there is extensive omental disease and peritoneal implants in the abdomen and upper pelvis. Numerous soft tissue masses are noted with a large conglomerate mass noted anteriorly at the level of the umbilicus which measures a maximum of 8 cm. Small amount of free pelvic fluid is noted. There are small pelvic sidewall lymph nodes. Reproductive: No significant findings. Other: No inguinal mass or adenopathy.  No subcutaneous  lesions. Musculoskeletal: No significant bony findings. No worrisome bone lesions. 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 IMPRESSION: 1. 5.5 x 3.9 cm pancreatic tail mass. 2. 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 3. Moderate-sized right pleural effusion and overlying atelectasis. 4. No definite findings for pulmonary metastatic disease. Electronically Signed   By: Marijo Sanes M.D.   On: 02/13/2020 17:04   CT Chest Wo Contrast  Result Date: 02/13/2020 CLINICAL DATA:  Persistent cough. Known pancreatic cancer. Abdominal pain. EXAM: CT CHEST, ABDOMEN AND PELVIS WITHOUT CONTRAST TECHNIQUE: Multidetector CT imaging of the chest, abdomen and pelvis was performed following the standard protocol without IV contrast. COMPARISON:  None. FINDINGS: CT CHEST FINDINGS Cardiovascular: The heart is normal in size. No pericardial effusion. The aorta is normal in caliber. Minimal scattered aortic calcifications. Mediastinum/Nodes: Scattered mediastinal lymph nodes without obvious mass or adenopathy. Small epicardial nodes are noted. The esophagus is grossly normal. The thyroid gland is grossly normal. Lungs/Pleura: Moderate-sized right pleural effusion is noted. There is moderate overlying atelectasis. No definite pulmonary nodules to suggest pulmonary metastatic disease.  Moderate eventration of the right hemidiaphragm with some overlying vascular crowding and atelectasis. Musculoskeletal: No breast masses, supraclavicular or axillary adenopathy. The bony thorax is intact. No obvious bone lesions. CT ABDOMEN PELVIS FINDINGS Hepatobiliary: No obvious hepatic lesions are identified without contrast. Pancreas: 5.5 x 3.9 cm soft tissue mass is noted in the pancreatic tail region. Spleen: Normal size.  No focal lesions. Adrenals/Urinary Tract: Adrenal glands and kidneys are grossly normal. The bladder is unremarkable. Stomach/Bowel: The stomach,  duodenum, small bowel and colon are grossly normal. No obvious inflammatory process, mass lesions or obstructive findings. Some high attenuation material is noted in the cecum and could be related to ingested material or prior scan. Vascular/Lymphatic: The aorta is normal in caliber. No atherosclerotic calcifications. Small scattered mesenteric and retroperitoneal lymph nodes are noted. Unfortunately, there is extensive omental disease and peritoneal implants in the abdomen and upper pelvis. Numerous soft tissue masses are noted with a large conglomerate mass noted anteriorly at the level of the umbilicus which measures a maximum of 8 cm. Small amount of free pelvic fluid is noted. There are small pelvic sidewall lymph nodes. Reproductive: No significant findings. Other: No inguinal mass or adenopathy.  No subcutaneous lesions. Musculoskeletal: No significant bony findings. No worrisome bone lesions. 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 IMPRESSION: 1. 5.5 x 3.9 cm pancreatic tail mass. 2. 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 3. Moderate-sized right pleural effusion and overlying atelectasis. 4. No definite findings for pulmonary metastatic disease. Electronically Signed   By: Marijo Sanes M.D.   On: 02/13/2020 17:04   CT ASPIRATION  Result Date: 02/14/2020 INDICATION: 54 year old female with a history pancreatic cancer with right pleural effusion EXAM: IMAGE GUIDED THORACENTESIS MEDICATIONS: The patient is currently admitted to the hospital and receiving intravenous antibiotics. The antibiotics were administered within an appropriate time frame prior to the initiation of the procedure. ANESTHESIA/SEDATION: None COMPLICATIONS: None PROCEDURE: Informed written consent was obtained from the patient after a thorough discussion of the procedural risks, benefits and alternatives. All questions were addressed. Maximal Sterile  Barrier Technique was utilized including caps, mask, sterile gowns, sterile gloves, sterile drape, hand hygiene and skin antiseptic. A timeout was performed prior to the initiation of the procedure. Patient was positioned in a seating position on the CT gantry table. Images of the right posterior chest were performed with images stored sent to PACs. The patient was then prepped and draped in the usual sterile fashion. 1% lidocaine was used for local anesthesia. Safe-T-Centesis kit was used to perform ultrasound-guided thoracentesis. Scant fluid was removed, with a sample sent for cytology. Needle was removed. Patient tolerated the procedure well and remained hemodynamically stable throughout. No complications were encountered and no significant blood loss. IMPRESSION: Status post ultrasound-guided thoracentesis of right pleural fluid. Sample was sent for cytology. Signed, Dulcy Fanny. Dellia Nims, RPVI [facility] Electronically Signed   By: Corrie Mckusick D.O.   On: 02/14/2020 14:34   CT BIOPSY  Result Date: 02/14/2020 INDICATION: 54 year old female with a history pancreatic cancer, referred for biopsy of peritoneal mass EXAM: CT BIOPSY MEDICATIONS: None. ANESTHESIA/SEDATION: Moderate (conscious) sedation was employed during this procedure. A total of Versed 2.0 mg and Fentanyl 100 mcg was administered intravenously. Moderate Sedation Time: 10 minutes. The patient's level of consciousness and vital signs were monitored continuously by radiology nursing throughout the procedure under my direct supervision. FLUOROSCOPY TIME:  CT COMPLICATIONS:  None PROCEDURE: Informed written consent was obtained from the patient after a thorough discussion of the procedural risks, benefits and alternatives. All questions were addressed. Maximal Sterile Barrier Technique was utilized including caps, mask, sterile gowns, sterile gloves, sterile drape, hand hygiene and skin  antiseptic. A timeout was performed prior to the initiation of the procedure. Patient was positioned in the supine position on the CT gantry table and a scout CT of the abdomen was performed for planning purposes. Once angle of approach was determined, the skin and subcutaneous tissues this scan was prepped and draped in the usual sterile fashion, and a sterile drape was applied covering the operative field. A sterile gown and sterile gloves were used for the procedure. Local anesthesia was provided with 1% Lidocaine. The skin and subcutaneous tissues were infiltrated 1% lidocaine for local anesthesia,. Guide needle was advanced with CT guidance into the mesenteric mass, at the level of the umbilicus. After confirmation of the tip, separate 18 gauge core biopsies were performed. These were placed into solution for transportation to the lab. Patient tolerated the procedure well and remained hemodynamically stable throughout. No complications were encountered and no significant blood loss was encounter IMPRESSION: Status post CT-guided biopsy of peritoneal mass at the umbilicus. Signed, Dulcy Fanny. Dellia Nims, RPVI [facility] Electronically Signed   By: Corrie Mckusick D.O.   On: 02/14/2020 14:01    Scheduled Meds:  aspirin EC  81 mg Oral Daily   [id number]       [START ON 02/15/2020] heparin  5,000 Units Subcutaneous Q8H   midazolam       pantoprazole  40 mg Oral Daily    Continuous Infusions:  sodium chloride 75 mL/hr at 02/14/20 1327     LOS: 1 day     Alma Friendly, MD [facility]  If 7PM-7AM, please contact night-coverage [email or web address] 02/14/2020, 2:41 PM

## 2020-02-14 NOTE — Procedures (Signed)
Interventional Radiology Procedure Note  Procedure: US guided right thoracentesis.  Small effusion.  For cytology.  Complications: None Recommendations:  - follow up cyto - Do not submerge - Routine lcare   Signed,  Dulcy Fanny. Earleen Newport, DO

## 2020-02-14 NOTE — Procedures (Signed)
Interventional Radiology Procedure Note  Procedure: CT guided biopsy of peritoneal mass.    Complications: None  Recommendations:  - Will proceed with right thora - Do not submerge for 7 days - Routine wound care   Signed,  Dulcy Fanny. Earleen Newport, DO

## 2020-02-14 NOTE — H&P (Signed)
History and Physical    Angela Wilson [id number] DOB: [birth year] DOA: 02/13/2020  PCP: No primary care provider on file.  Patient coming from: home  I have personally briefly reviewed patient's old medical records in [place]  Chief Complaint: abdominal pain  HPI: Angela Wilson is a 54 y.o. female with medical history significant of  diabetes, hypertension, pancreatic cancer diagnosed approximately 1 one month ago with localized disease and is currently undergoing evaluation in preparation for possible partial pancreectomy with Dr. Barry Dienes. Patient now presents to the ed due to progressive abdominal pain that is keeping her awake at night. She notes pain in diffuse but more intense in lower abdomen and right upper quadrant portion of her abdomen she describe pain as in the skin and not deep pain. She notes minimal associated nausea with her symptoms and per daughter is tolerating po, although does have early satiety. She denies emesis fever or chills. She states her last bm was 24 hours ago and note no blood or black stools. She notes no associated dysuria or urinary symptoms. She also notes no associated chest pain , near syncope, palpitations, or back pain. Or sob.    ED Course:  BP 104/66   Pulse 88   Temp 98.4 F (36.9 C) (Oral)   Resp 18   SpO2 97%  Labs/imaging [id number]  CT scan did show concern for her enlarged pancreatic mass with extensive omental and peritoneal metastatic involvement of the upper abdomen and pelvis with a 8 cm mass to the left of the umbilicus, new pleural effusion  Case was discussed with Dr Annamaria Boots /oncology  Who rec admission to hospitalist for pain control and further staging in order to clarify plan moving forwad now that it appears patient disease is no longer localized and rapidly changing with findings on CT.   Review of Systems: As per HPI otherwise 10 point review of systems negative.   History reviewed. No pertinent past medical  history. See above History reviewed. No pertinent surgical history. Multiple abdominal surgeries  has no history on file for tobacco, alcohol, and drug. none Allergies  Allergen Reactions  . Ivp Dye [Iodinated Diagnostic Agents]   . Morphine And Related   . Penicillins     History reviewed. No pertinent family history.  Prior to Admission medications   Medication Sig Start Date End Date Taking? Authorizing Provider  amLODipine (NORVASC) 10 MG tablet Take 10 mg by mouth daily. 12/27/19   [provider]  ergocalciferol (VITAMIN D2) 1.25 MG (50000 UT) capsule Take 50,000 Int'l Units by mouth once a week. 08/25/19   [provider]  glipiZIDE (GLUCOTROL) 10 MG tablet Take 10 mg by mouth 2 (two) times daily. 08/25/19   [provider]  HYDROcodone-acetaminophen (NORCO/VICODIN) 5-325 MG tablet Take 1 tablet by mouth every 6 (six) hours as needed. 01/31/20   [provider]  lisinopril (ZESTRIL) 10 MG tablet Take 10 mg by mouth daily. 12/27/19   [provider]  omeprazole (PRILOSEC) 40 MG capsule Take 40 mg by mouth daily. 08/20/19   [provider]  pravastatin (PRAVACHOL) 40 MG tablet Take 40 mg by mouth daily. 12/27/19   [provider]    Physical Exam: Vitals:   02/13/20 2330 02/14/20 0000 02/14/20 0030 02/14/20 0035  BP: 110/84 116/66 111/72   Pulse: 81 84 83 85  Resp: 19 18 17 17   Temp:   98.2 F (36.8 C)   TempSrc:   Oral   SpO2: 96%  93% 91% 97%     Vitals:   02/13/20 2330 02/14/20 0000 02/14/20 0030 02/14/20 0035  BP: 110/84 116/66 111/72   Pulse: 81 84 83 85  Resp: 19 18 17 17   Temp:   98.2 F (36.8 C)   TempSrc:   Oral   SpO2: 96% 93% 91% 97%  Constitutional: NAD, calm, comfortable Eyes: PERRL, lids and conjunctivae normal ENMT: Mucous membranes are moist. Posterior pharynx clear of any exudate or lesions.Normal dentition.  Neck: normal, supple, no masses, no thyromegaly Respiratory: clear to auscultation  bilaterally, no wheezing, no crackles. Normal respiratory effort. No accessory muscle use.  Cardiovascular: Regular rate and rhythm, no murmurs / rubs / gallops. No extremity edema. 2+ pedal pulses. No carotid bruits.  Abdomen:  Tenderness lower quad and right upper quad, no masses palpated. No hepatosplenomegaly. Bowel sounds positive.  Musculoskeletal: no clubbing / cyanosis. No joint deformity upper and lower extremities. Good ROM, no contractures. Normal muscle tone.  Skin: no rashes, lesions, ulcers. No induration Neurologic: CN 2-12 grossly intact. Sensation intact, DTR normal. Strength 5/5 in all 4.  Psychiatric: Normal judgment and insight. Alert and oriented x 3. Normal mood.    Labs on Admission: I have personally reviewed following labs and imaging studies  CBC: Recent Labs  Lab 02/13/20 1400  WBC 9.1  NEUTROABS 6.2  HGB 12.9  HCT 40.5  MCV 79.9*  PLT [id number]*   Basic Metabolic Panel: Recent Labs  Lab 02/13/20 1400  NA 133*  K 4.5  CL 99  CO2 22  GLUCOSE 168*  BUN 11  CREATININE 0.45  CALCIUM 9.2   GFR: CrCl cannot be calculated (Unknown ideal weight.). Liver Function Tests: Recent Labs  Lab 02/13/20 1400  AST 14*  ALT 15  ALKPHOS 49  BILITOT 0.3  PROT 8.5*  ALBUMIN 3.7   Recent Labs  Lab 02/13/20 1400  LIPASE 22   No results for input(s): AMMONIA in the last 168 hours. Coagulation Profile: No results for input(s): INR, PROTIME in the last 168 hours. Cardiac Enzymes: No results for input(s): CKTOTAL, CKMB, CKMBINDEX, TROPONINI in the last 168 hours. BNP (last 3 results) No results for input(s): PROBNP in the last 8760 hours. HbA1C: No results for input(s): HGBA1C in the last 72 hours. CBG: No results for input(s): GLUCAP in the last 168 hours. Lipid Profile: No results for input(s): CHOL, HDL, LDLCALC, TRIG, CHOLHDL, LDLDIRECT in the last 72 hours. Thyroid Function Tests: No results for input(s): TSH, T4TOTAL, FREET4, T3FREE, THYROIDAB in the  last 72 hours. Anemia Panel: No results for input(s): VITAMINB12, FOLATE, FERRITIN, TIBC, IRON, RETICCTPCT in the last 72 hours. Urine analysis:    Component Value Date/Time   COLORURINE YELLOW 02/13/2020 1338   APPEARANCEUR CLOUDY (A) 02/13/2020 1338   LABSPEC >1.030 (H) 02/13/2020 1338   PHURINE 5.5 02/13/2020 1338   GLUCOSEU NEGATIVE 02/13/2020 1338   HGBUR TRACE (A) 02/13/2020 1338   BILIRUBINUR NEGATIVE 02/13/2020 1338   KETONESUR NEGATIVE 02/13/2020 1338   PROTEINUR NEGATIVE 02/13/2020 1338   NITRITE NEGATIVE 02/13/2020 [place] 02/13/2020 1338    Radiological Exams on Admission: CT ABDOMEN PELVIS WO CONTRAST  Result Date: 02/13/2020 CLINICAL DATA:  Persistent cough. Known pancreatic cancer. Abdominal pain. EXAM: CT CHEST, ABDOMEN AND PELVIS WITHOUT CONTRAST TECHNIQUE: Multidetector CT imaging of the chest, abdomen and pelvis was performed following the standard protocol without IV contrast. COMPARISON:  None. FINDINGS: CT CHEST FINDINGS Cardiovascular: The heart is normal in size. No pericardial effusion.  The aorta is normal in caliber. Minimal scattered aortic calcifications. Mediastinum/Nodes: Scattered mediastinal lymph nodes without obvious mass or adenopathy. Small epicardial nodes are noted. The esophagus is grossly normal. The thyroid gland is grossly normal. Lungs/Pleura: Moderate-sized right pleural effusion is noted. There is moderate overlying atelectasis. No definite pulmonary nodules to suggest pulmonary metastatic disease. Moderate eventration of the right hemidiaphragm with some overlying vascular crowding and atelectasis. Musculoskeletal: No breast masses, supraclavicular or axillary adenopathy. The bony thorax is intact. No obvious bone lesions. CT ABDOMEN PELVIS FINDINGS Hepatobiliary: No obvious hepatic lesions are identified without contrast. Pancreas: 5.5 x 3.9 cm soft tissue mass is noted in the pancreatic tail region. Spleen: Normal size.  No  focal lesions. Adrenals/Urinary Tract: Adrenal glands and kidneys are grossly normal. The bladder is unremarkable. Stomach/Bowel: The stomach, duodenum, small bowel and colon are grossly normal. No obvious inflammatory process, mass lesions or obstructive findings. Some high attenuation material is noted in the cecum and could be related to ingested material or prior scan. Vascular/Lymphatic: The aorta is normal in caliber. No atherosclerotic calcifications. Small scattered mesenteric and retroperitoneal lymph nodes are noted. Unfortunately, there is extensive omental disease and peritoneal implants in the abdomen and upper pelvis. Numerous soft tissue masses are noted with a large conglomerate mass noted anteriorly at the level of the umbilicus which measures a maximum of 8 cm. Small amount of free pelvic fluid is noted. There are small pelvic sidewall lymph nodes. Reproductive: No significant findings. Other: No inguinal mass or adenopathy.  No subcutaneous lesions. Musculoskeletal: No significant bony findings. No worrisome bone lesions. 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 IMPRESSION: 1. 5.5 x 3.9 cm pancreatic tail mass. 2. 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 3. Moderate-sized right pleural effusion and overlying atelectasis. 4. No definite findings for pulmonary metastatic disease. Electronically Signed   By: Marijo Sanes M.D.   On: 02/13/2020 17:04   CT Chest Wo Contrast  Result Date: 02/13/2020 CLINICAL DATA:  Persistent cough. Known pancreatic cancer. Abdominal pain. EXAM: CT CHEST, ABDOMEN AND PELVIS WITHOUT CONTRAST TECHNIQUE: Multidetector CT imaging of the chest, abdomen and pelvis was performed following the standard protocol without IV contrast. COMPARISON:  None. FINDINGS: CT CHEST FINDINGS Cardiovascular: The heart is normal in size. No pericardial effusion. The aorta is normal in caliber. Minimal scattered  aortic calcifications. Mediastinum/Nodes: Scattered mediastinal lymph nodes without obvious mass or adenopathy. Small epicardial nodes are noted. The esophagus is grossly normal. The thyroid gland is grossly normal. Lungs/Pleura: Moderate-sized right pleural effusion is noted. There is moderate overlying atelectasis. No definite pulmonary nodules to suggest pulmonary metastatic disease. Moderate eventration of the right hemidiaphragm with some overlying vascular crowding and atelectasis. Musculoskeletal: No breast masses, supraclavicular or axillary adenopathy. The bony thorax is intact. No obvious bone lesions. CT ABDOMEN PELVIS FINDINGS Hepatobiliary: No obvious hepatic lesions are identified without contrast. Pancreas: 5.5 x 3.9 cm soft tissue mass is noted in the pancreatic tail region. Spleen: Normal size.  No focal lesions. Adrenals/Urinary Tract: Adrenal glands and kidneys are grossly normal. The bladder is unremarkable. Stomach/Bowel: The stomach, duodenum, small bowel and colon are grossly normal. No obvious inflammatory process, mass lesions or obstructive findings. Some high attenuation material is noted in the cecum and could be related to ingested material or prior scan. Vascular/Lymphatic: The aorta is normal in caliber. No atherosclerotic calcifications. Small scattered mesenteric and retroperitoneal lymph nodes are noted. Unfortunately, there is extensive  omental disease and peritoneal implants in the abdomen and upper pelvis. Numerous soft tissue masses are noted with a large conglomerate mass noted anteriorly at the level of the umbilicus which measures a maximum of 8 cm. Small amount of free pelvic fluid is noted. There are small pelvic sidewall lymph nodes. Reproductive: No significant findings. Other: No inguinal mass or adenopathy.  No subcutaneous lesions. Musculoskeletal: No significant bony findings. No worrisome bone lesions. Bilateral pars defects are noted at L4 and L5 with a grade 1  spondylolisthesis. IMPRESSION: 1. 5.5 x 3.9 cm pancreatic tail mass. 2. 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 3. Moderate-sized right pleural effusion and overlying atelectasis. 4. No definite findings for pulmonary metastatic disease. Electronically Signed   By: Marijo Sanes M.D.   On: 02/13/2020 17:04    EKG: Independently reviewed. Snr, nonspecific t wave abnormalities  Assessment/Plan  Acute rapid progression of known previously localized pancreatic cancer with what appears to be abdominal carcinomatosis -oncology to see in am for further management  Intractable abdominal pain due to progression of pancreatic cancer  -pain management per protocol  -resume oral home medications   -prn iv    DMII -is/fs  -hold oral hypoglycemic for now    hypertension -stable resume lisinopril   HLD -continue statin   GERD -omeprazole    FEN Mild hyponatremia  Monitor labs   DVT prophylaxis: heparin Code Status:Full Family Communication: Dtr at bedside Disposition Plan: 24-72 hours  Consults called: please call oncology, surgery Dr Barry Dienes in am  Admission status: inpatient   Clance Boll MD [facility]  If 7PM-7AM, please contact night-coverage [email or web address] Password [email or web address]  02/14/2020, 1:32 AM

## 2020-02-15 DIAGNOSIS — I10 Essential (primary) hypertension: Secondary | ICD-10-CM

## 2020-02-15 DIAGNOSIS — E785 Hyperlipidemia, unspecified: Secondary | ICD-10-CM

## 2020-02-15 DIAGNOSIS — E1169 Type 2 diabetes mellitus with other specified complication: Secondary | ICD-10-CM

## 2020-02-15 LAB — CANCER ANTIGEN 19-9: CA 19-9: 3 U/mL (ref 0–35)

## 2020-02-15 LAB — CBC WITH DIFFERENTIAL/PLATELET
Abs Immature Granulocytes: 0.03 10*3/uL (ref 0.00–0.07)
Basophils Absolute: 0 10*3/uL (ref 0.0–0.1)
Basophils Relative: 1 %
Eosinophils Absolute: 0.5 10*3/uL (ref 0.0–0.5)
Eosinophils Relative: 6 %
HCT: 38.8 % (ref 36.0–46.0)
Hemoglobin: 11.7 g/dL — ABNORMAL LOW (ref 12.0–15.0)
Immature Granulocytes: 0 %
Lymphocytes Relative: 24 %
Lymphs Abs: 1.7 10*3/uL (ref 0.7–4.0)
MCH: 25.1 pg — ABNORMAL LOW (ref 26.0–34.0)
MCHC: 30.2 g/dL (ref 30.0–36.0)
MCV: 83.3 fL (ref 80.0–100.0)
Monocytes Absolute: 0.3 10*3/uL (ref 0.1–1.0)
Monocytes Relative: 5 %
Neutro Abs: 4.5 10*3/uL (ref 1.7–7.7)
Neutrophils Relative %: 64 %
Platelets: 377 10*3/uL (ref 150–400)
RBC: 4.66 MIL/uL (ref 3.87–5.11)
RDW: 13.6 % (ref 11.5–15.5)
WBC: 7 10*3/uL (ref 4.0–10.5)
nRBC: 0 % (ref 0.0–0.2)

## 2020-02-15 LAB — BASIC METABOLIC PANEL
Anion gap: 8 (ref 5–15)
BUN: 8 mg/dL (ref 6–20)
CO2: 22 mmol/L (ref 22–32)
Calcium: 8.4 mg/dL — ABNORMAL LOW (ref 8.9–10.3)
Chloride: 104 mmol/L (ref 98–111)
Creatinine, Ser: 0.42 mg/dL — ABNORMAL LOW (ref 0.44–1.00)
GFR calc Af Amer: >60 mL/min (ref >60)
GFR calc non Af Amer: >60 mL/min (ref >60)
Glucose, Bld: 179 mg/dL — ABNORMAL HIGH (ref 70–99)
Potassium: 3.9 mmol/L (ref 3.5–5.1)
Sodium: 134 mmol/L — ABNORMAL LOW (ref 135–145)

## 2020-02-15 LAB — GLUCOSE, CAPILLARY
Glucose-Capillary: 144 mg/dL — ABNORMAL HIGH (ref 70–99)
Glucose-Capillary: 115 mg/dL — ABNORMAL HIGH (ref 70–99)
Glucose-Capillary: 148 mg/dL — ABNORMAL HIGH (ref 70–99)

## 2020-02-15 MED ORDER — INSULIN ASPART 100 UNIT/ML ~~LOC~~ SOLN: 0.0000 [IU] | Freq: Every day | SUBCUTANEOUS | Status: DC

## 2020-02-15 MED ORDER — INSULIN ASPART 100 UNIT/ML ~~LOC~~ SOLN
0.0000 [IU] | Freq: Three times a day (TID) | SUBCUTANEOUS | Status: DC
  Administered 2020-02-15: 1 [IU] via SUBCUTANEOUS
  Administered 2020-02-16 (×2): 2 [IU] via SUBCUTANEOUS

## 2020-02-15 NOTE — Progress Notes (Signed)
PROGRESS NOTE  Angela Wilson [id number] DOB: Oct 23, 1965 DOA: 02/13/2020 PCP: No primary care provider on file.  HPI/Recap of past 24 hours: HPI from Dr Georgina Pillion is a 54 y.o. female with medical history significant for diabetes, hypertension, pancreatic cancer diagnosed approximately 1 one month ago with localized disease and is currently undergoing evaluation in preparation for possible partial pancreectomy with Dr. Barry Dienes. Patient now presents to the ed due to progressive abdominal pain that is keeping her awake at night. She notes pain in diffuse but more intense in lower abdomen and right upper quadrant portion of her abdomen she describe pain as in the skin and not deep pain. She notes minimal associated nausea with her symptoms and per daughter is tolerating po, although does have early satiety. Denies any other symptoms. In the ED, VSS, labs fairly unremarkable. CT abd/pelvis showed concern for enlarged pancreatic mass with extensive omental and peritoneal metastatic involvement of the upper abdomen and pelvis with a 8 cm mass to the left of the umbilicus, with pleural effusion. Case was discussed with Dr Annamaria Boots Yvonna Alanis, who recommended admission for pain control and further staging in order to clarify management, given progressive disease.    Today, patient reports abdominal pain is controlled with pain medication.  Daughter at bedside, answered all questions asked.     Assessment/Plan: Active Problems:   Cancer associated pain   Pancreatic cancer ([place])   ?Possible lymphoma versus metastatic pancreatic cancer with intractable pain CT abdomen showed concern for enlarged pancreatic mass with extensive omental and peritoneal involvement, right-sided pleural effusion Ca 19-9 WNL Oncology on board, appreciate recs IR on board, status post CT-guided biopsy of peritoneal mass, and status post ultrasound-guided right thoracentesis sent for cytology on 02/14/2020, await  biopsy results Pain management  Diabetes mellitus type 2 A1c pending SSI, Accu-Cheks, hypoglycemic protocol Hold home oral hypoglycemics  Hypertension BP on the soft side Hold home lisinopril, amlodipine for now  HLD Continue pravastatin  GERD Continue PPI        Malnutrition Type:      Malnutrition Characteristics:      Nutrition Interventions:       There is no height or weight on file to calculate BMI.     Code Status: Full  Family Communication: Discussed with daughter at bedside on 02/15/20  Disposition Plan: Status is: Inpatient  Remains inpatient appropriate because:Inpatient level of care appropriate due to severity of illness   Dispo: The patient is from: Home              Anticipated d/c is to: Home              Anticipated d/c date is: 1 day              Patient currently is not medically stable to d/c.    Consultants:  Oncology  IR  Procedures:  CT-guided biopsy peritoneal mass on 02/14/2020  Right thoracentesis on 02/14/2020  Antimicrobials:  None  DVT prophylaxis: SCDs   Objective: Vitals:   02/14/20 1324 02/14/20 2038 02/15/20 0550 02/15/20 1207  BP: 109/70 125/78 127/67 136/65  Pulse: 82 91 96 92  Resp: 16 18 16 20   Temp: 99 F (37.2 C) 97.9 F (36.6 C) 98 F (36.7 C) 98.2 F (36.8 C)  TempSrc: Oral Oral Oral Oral  SpO2: 98% 96% 98% 97%    Intake/Output Summary (Last 24 hours) at 02/15/2020 1412 Last data filed at 02/14/2020 1800 Gross per 24 hour  Intake 993.75  ml  Output --  Net 993.75 ml   There were no vitals filed for this visit.  Exam:  General: NAD   Cardiovascular: S1, S2 present  Respiratory: CTAB  Abdomen: Soft, NT, nondistended, bowel sounds present  Musculoskeletal: No bilateral pedal edema noted  Skin: Normal  Psychiatry: Normal mood   Data Reviewed: CBC: Recent Labs  Lab 02/13/20 1400 02/14/20 0416 02/15/20 0433  WBC 9.1 8.4 7.0  NEUTROABS 6.2  --  4.5  HGB 12.9  11.6* 11.7*  HCT 40.5 38.1 38.8  MCV 79.9* 83.2 83.3  PLT 469* 379 [id number]   Basic Metabolic Panel: Recent Labs  Lab 02/13/20 1400 02/14/20 0416 02/15/20 0433  NA 133* 137 134*  K 4.5 3.8 3.9  CL 99 104 104  CO2 22 22 22   GLUCOSE 168* 159* 179*  BUN 11 9 8   CREATININE 0.45 0.40* 0.42*  CALCIUM 9.2 8.6* 8.4*   GFR: CrCl cannot be calculated (Unknown ideal weight.). Liver Function Tests: Recent Labs  Lab 02/13/20 1400  AST 14*  ALT 15  ALKPHOS 49  BILITOT 0.3  PROT 8.5*  ALBUMIN 3.7   Recent Labs  Lab 02/13/20 1400  LIPASE 22   No results for input(s): AMMONIA in the last 168 hours. Coagulation Profile: Recent Labs  Lab 02/14/20 1004  INR 1.2   Cardiac Enzymes: No results for input(s): CKTOTAL, CKMB, CKMBINDEX, TROPONINI in the last 168 hours. BNP (last 3 results) No results for input(s): PROBNP in the last 8760 hours. HbA1C: No results for input(s): HGBA1C in the last 72 hours. CBG: Recent Labs  Lab 02/15/20 1130  GLUCAP 144*   Lipid Profile: No results for input(s): CHOL, HDL, LDLCALC, TRIG, CHOLHDL, LDLDIRECT in the last 72 hours. Thyroid Function Tests: Recent Labs    02/14/20 0416  TSH 2.028   Anemia Panel: No results for input(s): VITAMINB12, FOLATE, FERRITIN, TIBC, IRON, RETICCTPCT in the last 72 hours. Urine analysis:    Component Value Date/Time   COLORURINE YELLOW 02/14/2020 0212   APPEARANCEUR CLEAR 02/14/2020 0212   LABSPEC 1.009 02/14/2020 0212   PHURINE 5.0 02/14/2020 0212   GLUCOSEU NEGATIVE 02/14/2020 0212   HGBUR NEGATIVE 02/14/2020 0212   BILIRUBINUR NEGATIVE 02/14/2020 0212   KETONESUR NEGATIVE 02/14/2020 0212   PROTEINUR NEGATIVE 02/14/2020 0212   NITRITE NEGATIVE 02/14/2020 0212   LEUKOCYTESUR NEGATIVE 02/14/2020 0212   Sepsis Labs: @LABRCNTIP (procalcitonin:4,lacticidven:4)  ) Recent Results (from the past 240 hour(s))  SARS Coronavirus 2 by RT PCR (hospital order, performed in [facility] hospital lab)  Nasopharyngeal Nasopharyngeal Swab     Status: None   Collection Time: 02/13/20  6:35 PM   Specimen: Nasopharyngeal Swab  Result Value Ref Range Status   SARS Coronavirus 2 NEGATIVE NEGATIVE Final    Comment: (NOTE) SARS-CoV-2 target nucleic acids are NOT DETECTED. The SARS-CoV-2 RNA is generally detectable in upper and lower respiratory specimens during the acute phase of infection. The lowest concentration of SARS-CoV-2 viral copies this assay can detect is 250 copies / mL. A negative result does not preclude SARS-CoV-2 infection and should not be used as the sole basis for treatment or other patient management decisions.  A negative result may occur with improper specimen collection / handling, submission of specimen other than nasopharyngeal swab, presence of viral mutation(s) within the areas targeted by this assay, and inadequate number of viral copies (<250 copies / mL). A negative result must be combined with clinical observations, patient history, and epidemiological information. Fact Sheet for Patients:  StrictlyIdeas.no Fact Sheet for Healthcare Providers: BankingDealers.co.za This test is not yet approved or cleared  by the [place] FDA and has been authorized for detection and/or diagnosis of SARS-CoV-2 by FDA under an Emergency Use Authorization (EUA).  This EUA will remain in effect (meaning this test can be used) for the duration of the COVID-19 declaration under Section 564(b)(1) of the Act, 21 U.S.C. section 360bbb-3(b)(1), unless the authorization is terminated or revoked sooner. Performed at [facility], [address] [place], [place] [address]       Studies: No results found.  Scheduled Meds: . aspirin EC  81 mg Oral Daily  . heparin  5,000 Units Subcutaneous Q8H  . insulin aspart  0-5 Units Subcutaneous QHS  . insulin aspart  0-9 Units Subcutaneous TID WC  . pantoprazole  40 mg Oral  Daily    Continuous Infusions:    LOS: 2 days     Alma Friendly, MD [facility]  If 7PM-7AM, please contact night-coverage [email or web address] 02/15/2020, 2:12 PM

## 2020-02-15 NOTE — Progress Notes (Signed)
IR aware of request for port placement. Pt known to IR service as just had image guided biopsy and thoracentesis. Path pending. Can try to accommodate port as early as Tues if pt remains inpt. Can always be scheduled as outpt if pt otherwise stable for discharge. IR will cont following.  Ascencion Dike PA-C Interventional Radiology 02/15/2020 11:34 AM

## 2020-02-15 NOTE — Progress Notes (Signed)
I had a long talk with the patient and her daughter this morning.  I really have to wonder if this is metastatic pancreatic cancer.  I would have thought that her CA 19-9 would be very elevated.  Her CA 19-9 is normal.  I just wonder if this might be a form of lymphoma.  I have seen lymphoma that can mimic pancreatic cancer.  I just also worry that her liver is not involved by malignancy.  It is also very rare for pancreatic cancer to cause a pleural effusion.  Clearly, the biopsies are going to be essential.  Unfortunately, with the holiday weekend, we are not can have any results until the earliest on Tuesday.  The patient has decided to take treatment no matter what this is.  I told her daughter that if this is lymphoma, treatment should be effective and over 90% of cases.  If we are dealing with pancreatic cancer, then treatment is probably going to be effective in less than 20% of patients.  If this is lymphoma, there is the potential that she could be cured.  If this is pancreatic cancer, then cure would be impossible.  I told the daughter that if her mom decided not to take any treatment, I doubt that she will survive more than 2-3 months.  Clearly, what ever is going on is very aggressive.  She did not have this 6 weeks ago.  If this is lymphoma, this would have to be a high-grade lymphoma.  Again I know that her mom has had reservations against treatment with chemotherapy.  I think that she is "old school" and has heard about all the bad side effects from chemotherapy.  I try to tell her that we have gotten a whole lot better with chemotherapy and that patients do not get nearly as sick.  However, she probably will lose her hair.  I know this might be an issue.  Her labs today look pretty good.  Her blood sugars need to be under good control.  I think she had been on blood pressure medication.  I do not think she is on this right now.  I very much appreciate the wonderful care that  she got from radiology.  She is already had the biopsy for her periumbilical mass.  There is always is a version of the Sister Wynona Dove Node.  She will need to have a Port-A-Cath placed.  Surprisingly, the thoracentesis really did not pull out much fluid.  We now will just have to wait for the path report.  I very much appreciate the wonderful care that she is getting from all the staff on [place], MD  Michaelyn Barter 6:18

## 2020-02-16 LAB — CBC WITH DIFFERENTIAL/PLATELET
Abs Immature Granulocytes: 0.03 10*3/uL (ref 0.00–0.07)
Basophils Absolute: 0 10*3/uL (ref 0.0–0.1)
Basophils Relative: 1 %
Eosinophils Absolute: 0.3 10*3/uL (ref 0.0–0.5)
Eosinophils Relative: 5 %
HCT: 39.8 % (ref 36.0–46.0)
Hemoglobin: 12.3 g/dL (ref 12.0–15.0)
Immature Granulocytes: 1 %
Lymphocytes Relative: 27 %
Lymphs Abs: 1.8 10*3/uL (ref 0.7–4.0)
MCH: 25.7 pg — ABNORMAL LOW (ref 26.0–34.0)
MCHC: 30.9 g/dL (ref 30.0–36.0)
MCV: 83.3 fL (ref 80.0–100.0)
Monocytes Absolute: 0.3 10*3/uL (ref 0.1–1.0)
Monocytes Relative: 5 %
Neutro Abs: 4 10*3/uL (ref 1.7–7.7)
Neutrophils Relative %: 61 %
Platelets: 345 10*3/uL (ref 150–400)
RBC: 4.78 MIL/uL (ref 3.87–5.11)
RDW: 13.7 % (ref 11.5–15.5)
WBC: 6.5 10*3/uL (ref 4.0–10.5)
nRBC: 0 % (ref 0.0–0.2)

## 2020-02-16 LAB — COMPREHENSIVE METABOLIC PANEL
ALT: 11 U/L (ref 0–44)
AST: 9 U/L — ABNORMAL LOW (ref 15–41)
Albumin: 3.3 g/dL — ABNORMAL LOW (ref 3.5–5.0)
Alkaline Phosphatase: 44 U/L (ref 38–126)
Anion gap: 9 (ref 5–15)
BUN: 6 mg/dL (ref 6–20)
CO2: 25 mmol/L (ref 22–32)
Calcium: 8.7 mg/dL — ABNORMAL LOW (ref 8.9–10.3)
Chloride: 104 mmol/L (ref 98–111)
Creatinine, Ser: 0.52 mg/dL (ref 0.44–1.00)
GFR calc Af Amer: >60 mL/min (ref >60)
GFR calc non Af Amer: >60 mL/min (ref >60)
Glucose, Bld: 164 mg/dL — ABNORMAL HIGH (ref 70–99)
Potassium: 3.9 mmol/L (ref 3.5–5.1)
Sodium: 138 mmol/L (ref 135–145)
Total Bilirubin: 0.4 mg/dL (ref 0.3–1.2)
Total Protein: 7.3 g/dL (ref 6.5–8.1)

## 2020-02-16 LAB — GLUCOSE, CAPILLARY
Glucose-Capillary: 164 mg/dL — ABNORMAL HIGH (ref 70–99)
Glucose-Capillary: 190 mg/dL — ABNORMAL HIGH (ref 70–99)
Glucose-Capillary: 146 mg/dL — ABNORMAL HIGH (ref 70–99)
Glucose-Capillary: 123 mg/dL — ABNORMAL HIGH (ref 70–99)

## 2020-02-16 LAB — HEMOGLOBIN A1C
Hgb A1c MFr Bld: 8.6 % — ABNORMAL HIGH (ref 4.8–5.6)
Mean Plasma Glucose: 200.12 mg/dL

## 2020-02-16 LAB — LACTATE DEHYDROGENASE: LDH: 83 U/L — ABNORMAL LOW (ref 98–192)

## 2020-02-16 LAB — URIC ACID: Uric Acid, Serum: 4.5 mg/dL (ref 2.5–7.1)

## 2020-02-16 MED ORDER — INSULIN ASPART 100 UNIT/ML ~~LOC~~ SOLN
0.0000 [IU] | Freq: Three times a day (TID) | SUBCUTANEOUS | Status: DC
  Administered 2020-02-16: 2 [IU] via SUBCUTANEOUS
  Administered 2020-02-17: 3 [IU] via SUBCUTANEOUS
  Administered 2020-02-17: 5 [IU] via SUBCUTANEOUS
  Administered 2020-02-17: 2 [IU] via SUBCUTANEOUS
  Administered 2020-02-18: 3 [IU] via SUBCUTANEOUS

## 2020-02-16 MED ORDER — FENTANYL CITRATE (PF) 100 MCG/2ML IJ SOLN: 50.0000 ug | INTRAMUSCULAR | Status: DC | PRN

## 2020-02-16 MED ORDER — POLYETHYLENE GLYCOL 3350 17 G PO PACK
17.0000 g | PACK | Freq: Every day | ORAL | Status: DC
  Administered 2020-02-16 – 2020-02-17 (×2): 17 g via ORAL
  Filled 2020-02-16 (×2): qty 1

## 2020-02-16 MED ORDER — SENNOSIDES-DOCUSATE SODIUM 8.6-50 MG PO TABS
1.0000 | ORAL_TABLET | Freq: Two times a day (BID) | ORAL | Status: DC
  Administered 2020-02-16 – 2020-02-18 (×5): 1 via ORAL
  Filled 2020-02-16 (×5): qty 1

## 2020-02-16 NOTE — Progress Notes (Signed)
PROGRESS NOTE  Nayara Bry [id number] DOB: 02-03-1966 DOA: 02/13/2020 PCP: No primary care provider on file.  HPI/Recap of past 24 hours: HPI from Dr Georgina Pillion is a 54 y.o. female with medical history significant for diabetes, hypertension, pancreatic cancer diagnosed approximately 1 one month ago with localized disease and is currently undergoing evaluation in preparation for possible partial pancreectomy with Dr. Barry Dienes. Patient now presents to the ed due to progressive abdominal pain that is keeping her awake at night. She notes pain in diffuse but more intense in lower abdomen and right upper quadrant portion of her abdomen she describe pain as in the skin and not deep pain. She notes minimal associated nausea with her symptoms and per daughter is tolerating po, although does have early satiety. Denies any other symptoms. In the ED, VSS, labs fairly unremarkable. CT abd/pelvis showed concern for enlarged pancreatic mass with extensive omental and peritoneal metastatic involvement of the upper abdomen and pelvis with a 8 cm mass to the left of the umbilicus, with pleural effusion. Case was discussed with Dr Annamaria Boots Yvonna Alanis, who recommended admission for pain control and further staging in order to clarify management, given progressive disease.     Today, daughter at bedside, reported patient complains of abdominal pain especially upon ambulation, denies any chest pain, shortness of breath, nausea/vomiting, fever/chills.  No BM since admission.     Assessment/Plan: Active Problems:   Cancer associated pain   Pancreatic cancer ([place])   ?Possible lymphoma versus metastatic pancreatic cancer with intractable pain CT abdomen showed concern for enlarged pancreatic mass with extensive omental and peritoneal involvement, right-sided pleural effusion Ca 19-9 WNL Oncology on board, appreciate recs IR on board, status post CT-guided biopsy of peritoneal mass, and status post  ultrasound-guided right thoracentesis sent for cytology on 02/14/2020, await biopsy results Pain management  Diabetes mellitus type 2 A1c 8.6 SSI, Accu-Cheks, hypoglycemic protocol Hold home oral hypoglycemics  Hypertension BP on the soft side Hold home lisinopril, amlodipine for now  HLD Continue pravastatin  GERD Continue PPI        Malnutrition Type:      Malnutrition Characteristics:      Nutrition Interventions:       There is no height or weight on file to calculate BMI.     Code Status: Full  Family Communication: Discussed with daughter at bedside on 02/16/20  Disposition Plan: Status is: Inpatient  Remains inpatient appropriate because:Inpatient level of care appropriate due to severity of illness   Dispo: The patient is from: Home              Anticipated d/c is to: Home              Anticipated d/c date is: 1 day              Patient currently is not medically stable to d/c.    Consultants:  Oncology  IR  Procedures:  CT-guided biopsy peritoneal mass on 02/14/2020  Right thoracentesis on 02/14/2020  Antimicrobials:  None  DVT prophylaxis: SCDs   Objective: Vitals:   02/15/20 1207 02/15/20 2148 02/16/20 0527 02/16/20 1157  BP: 136/65 121/89 126/68 127/74  Pulse: 92 91 95 98  Resp: 20 18 18    Temp: 98.2 F (36.8 C) 98.9 F (37.2 C) 99.5 F (37.5 C) 98.8 F (37.1 C)  TempSrc: Oral Oral Oral Oral  SpO2: 97% 96% 93% 95%    Intake/Output Summary (Last 24 hours) at 02/16/2020 1403 Last data filed  at 02/15/2020 2200 Gross per 24 hour  Intake 240 ml  Output --  Net 240 ml   There were no vitals filed for this visit.  Exam:  General: NAD   Cardiovascular: S1, S2 present  Respiratory: CTAB  Abdomen: Soft, NT, nondistended, bowel sounds present  Musculoskeletal: No bilateral pedal edema noted  Skin: Normal  Psychiatry: Normal mood   Data Reviewed: CBC: Recent Labs  Lab 02/13/20 1400 02/14/20 0416  02/15/20 0433 02/16/20 0406  WBC 9.1 8.4 7.0 6.5  NEUTROABS 6.2  --  4.5 4.0  HGB 12.9 11.6* 11.7* 12.3  HCT 40.5 38.1 38.8 39.8  MCV 79.9* 83.2 83.3 83.3  PLT 469* 379 377 [id number]   Basic Metabolic Panel: Recent Labs  Lab 02/13/20 1400 02/14/20 0416 02/15/20 0433 02/16/20 0406  NA 133* 137 134* 138  K 4.5 3.8 3.9 3.9  CL 99 104 104 104  CO2 22 22 22 25   GLUCOSE 168* 159* 179* 164*  BUN 11 9 8 6   CREATININE 0.45 0.40* 0.42* 0.52  CALCIUM 9.2 8.6* 8.4* 8.7*   GFR: CrCl cannot be calculated (Unknown ideal weight.). Liver Function Tests: Recent Labs  Lab 02/13/20 1400 02/16/20 0406  AST 14* 9*  ALT 15 11  ALKPHOS 49 44  BILITOT 0.3 0.4  PROT 8.5* 7.3  ALBUMIN 3.7 3.3*   Recent Labs  Lab 02/13/20 1400  LIPASE 22   No results for input(s): AMMONIA in the last 168 hours. Coagulation Profile: Recent Labs  Lab 02/14/20 1004  INR 1.2   Cardiac Enzymes: No results for input(s): CKTOTAL, CKMB, CKMBINDEX, TROPONINI in the last 168 hours. BNP (last 3 results) No results for input(s): PROBNP in the last 8760 hours. HbA1C: Recent Labs    02/16/20 0406  HGBA1C 8.6*   CBG: Recent Labs  Lab 02/15/20 1130 02/15/20 1633 02/15/20 2144 02/16/20 0756 02/16/20 1153  GLUCAP 144* 115* 148* 164* 190*   Lipid Profile: No results for input(s): CHOL, HDL, LDLCALC, TRIG, CHOLHDL, LDLDIRECT in the last 72 hours. Thyroid Function Tests: Recent Labs    02/14/20 0416  TSH 2.028   Anemia Panel: No results for input(s): VITAMINB12, FOLATE, FERRITIN, TIBC, IRON, RETICCTPCT in the last 72 hours. Urine analysis:    Component Value Date/Time   COLORURINE YELLOW 02/14/2020 0212   APPEARANCEUR CLEAR 02/14/2020 0212   LABSPEC 1.009 02/14/2020 0212   PHURINE 5.0 02/14/2020 0212   GLUCOSEU NEGATIVE 02/14/2020 0212   HGBUR NEGATIVE 02/14/2020 0212   BILIRUBINUR NEGATIVE 02/14/2020 0212   KETONESUR NEGATIVE 02/14/2020 0212   PROTEINUR NEGATIVE 02/14/2020 0212   NITRITE  NEGATIVE 02/14/2020 0212   LEUKOCYTESUR NEGATIVE 02/14/2020 0212   Sepsis Labs: @LABRCNTIP (procalcitonin:4,lacticidven:4)  ) Recent Results (from the past 240 hour(s))  SARS Coronavirus 2 by RT PCR (hospital order, performed in [facility] hospital lab) Nasopharyngeal Nasopharyngeal Swab     Status: None   Collection Time: 02/13/20  6:35 PM   Specimen: Nasopharyngeal Swab  Result Value Ref Range Status   SARS Coronavirus 2 NEGATIVE NEGATIVE Final    Comment: (NOTE) SARS-CoV-2 target nucleic acids are NOT DETECTED. The SARS-CoV-2 RNA is generally detectable in upper and lower respiratory specimens during the acute phase of infection. The lowest concentration of SARS-CoV-2 viral copies this assay can detect is 250 copies / mL. A negative result does not preclude SARS-CoV-2 infection and should not be used as the sole basis for treatment or other patient management decisions.  A negative result may occur with improper specimen  collection / handling, submission of specimen other than nasopharyngeal swab, presence of viral mutation(s) within the areas targeted by this assay, and inadequate number of viral copies (<250 copies / mL). A negative result must be combined with clinical observations, patient history, and epidemiological information. Fact Sheet for Patients:   StrictlyIdeas.no Fact Sheet for Healthcare Providers: BankingDealers.co.za This test is not yet approved or cleared  by the [place] FDA and has been authorized for detection and/or diagnosis of SARS-CoV-2 by FDA under an Emergency Use Authorization (EUA).  This EUA will remain in effect (meaning this test can be used) for the duration of the COVID-19 declaration under Section 564(b)(1) of the Act, 21 U.S.C. section 360bbb-3(b)(1), unless the authorization is terminated or revoked sooner. Performed at [facility], [address] [place], [place]  [address]       Studies: No results found.  Scheduled Meds: . aspirin EC  81 mg Oral Daily  . heparin  5,000 Units Subcutaneous Q8H  . insulin aspart  0-5 Units Subcutaneous QHS  . insulin aspart  0-9 Units Subcutaneous TID WC  . pantoprazole  40 mg Oral Daily  . polyethylene glycol  17 g Oral Daily  . senna-docusate  1 tablet Oral BID    Continuous Infusions:    LOS: 3 days     Alma Friendly, MD [facility]  If 7PM-7AM, please contact night-coverage [email or web address] 02/16/2020, 2:03 PM

## 2020-02-17 LAB — GLUCOSE, CAPILLARY
Glucose-Capillary: 163 mg/dL — ABNORMAL HIGH (ref 70–99)
Glucose-Capillary: 220 mg/dL — ABNORMAL HIGH (ref 70–99)
Glucose-Capillary: 125 mg/dL — ABNORMAL HIGH (ref 70–99)
Glucose-Capillary: 160 mg/dL — ABNORMAL HIGH (ref 70–99)

## 2020-02-17 LAB — CBC WITH DIFFERENTIAL/PLATELET
Abs Immature Granulocytes: 0.03 10*3/uL (ref 0.00–0.07)
Basophils Absolute: 0 10*3/uL (ref 0.0–0.1)
Basophils Relative: 0 %
Eosinophils Absolute: 0.4 10*3/uL (ref 0.0–0.5)
Eosinophils Relative: 6 %
HCT: 41.1 % (ref 36.0–46.0)
Hemoglobin: 12.6 g/dL (ref 12.0–15.0)
Immature Granulocytes: 0 %
Lymphocytes Relative: 24 %
Lymphs Abs: 1.7 10*3/uL (ref 0.7–4.0)
MCH: 25.8 pg — ABNORMAL LOW (ref 26.0–34.0)
MCHC: 30.7 g/dL (ref 30.0–36.0)
MCV: 84.2 fL (ref 80.0–100.0)
Monocytes Absolute: 0.4 10*3/uL (ref 0.1–1.0)
Monocytes Relative: 5 %
Neutro Abs: 4.5 10*3/uL (ref 1.7–7.7)
Neutrophils Relative %: 65 %
Platelets: 353 10*3/uL (ref 150–400)
RBC: 4.88 MIL/uL (ref 3.87–5.11)
RDW: 13.8 % (ref 11.5–15.5)
WBC: 7.1 10*3/uL (ref 4.0–10.5)
nRBC: 0 % (ref 0.0–0.2)

## 2020-02-17 LAB — COMPREHENSIVE METABOLIC PANEL
ALT: 13 U/L (ref 0–44)
AST: 12 U/L — ABNORMAL LOW (ref 15–41)
Albumin: 3.3 g/dL — ABNORMAL LOW (ref 3.5–5.0)
Alkaline Phosphatase: 42 U/L (ref 38–126)
Anion gap: 8 (ref 5–15)
BUN: 6 mg/dL (ref 6–20)
CO2: 26 mmol/L (ref 22–32)
Calcium: 8.9 mg/dL (ref 8.9–10.3)
Chloride: 103 mmol/L (ref 98–111)
Creatinine, Ser: 0.45 mg/dL (ref 0.44–1.00)
GFR calc Af Amer: >60 mL/min (ref >60)
GFR calc non Af Amer: >60 mL/min (ref >60)
Glucose, Bld: 160 mg/dL — ABNORMAL HIGH (ref 70–99)
Potassium: 4.1 mmol/L (ref 3.5–5.1)
Sodium: 137 mmol/L (ref 135–145)
Total Bilirubin: 0.5 mg/dL (ref 0.3–1.2)
Total Protein: 7.3 g/dL (ref 6.5–8.1)

## 2020-02-17 MED ORDER — AMLODIPINE BESYLATE 5 MG PO TABS
5.0000 mg | ORAL_TABLET | Freq: Every day | ORAL | Status: DC
  Administered 2020-02-17 – 2020-02-18 (×2): 5 mg via ORAL
  Filled 2020-02-17 (×2): qty 1

## 2020-02-17 MED ORDER — POLYETHYLENE GLYCOL 3350 17 G PO PACK
17.0000 g | PACK | Freq: Two times a day (BID) | ORAL | Status: DC
  Administered 2020-02-17 – 2020-02-18 (×2): 17 g via ORAL
  Filled 2020-02-17 (×2): qty 1

## 2020-02-17 NOTE — Progress Notes (Addendum)
PROGRESS NOTE  Angela Wilson [id number] DOB: Jan 21, 1966 DOA: 02/13/2020 PCP: No primary care provider on file.  HPI/Recap of past 24 hours: HPI from Dr Georgina Pillion is a 54 y.o. female with medical history significant for diabetes, hypertension, pancreatic cancer diagnosed approximately 1 one month ago with localized disease and is currently undergoing evaluation in preparation for possible partial pancreectomy with Dr. Barry Dienes. Patient now presents to the ed due to progressive abdominal pain that is keeping her awake at night. She notes pain in diffuse but more intense in lower abdomen and right upper quadrant portion of her abdomen she describe pain as in the skin and not deep pain. She notes minimal associated nausea with her symptoms and per daughter is tolerating po, although does have early satiety. Denies any other symptoms. In the ED, VSS, labs fairly unremarkable. CT abd/pelvis showed concern for enlarged pancreatic mass with extensive omental and peritoneal metastatic involvement of the upper abdomen and pelvis with a 8 cm mass to the left of the umbilicus, with pleural effusion. Case was discussed with Dr Annamaria Boots Yvonna Alanis, who recommended admission for pain control and further staging in order to clarify management, given progressive disease.    Today, patient continues to have abdominal pain especially on ambulation, denies any nausea/vomiting, fever/chills, chest pain, shortness of breath.  Daughter at bedside.     Assessment/Plan: Active Problems:   Cancer associated pain   Pancreatic cancer ([place])   ?Possible lymphoma versus metastatic pancreatic cancer with intractable pain CT abdomen showed concern for enlarged pancreatic mass with extensive omental and peritoneal involvement, right-sided pleural effusion Ca 19-9 WNL Oncology on board, appreciate recs IR on board, status post CT-guided biopsy of peritoneal mass, and status post ultrasound-guided right  thoracentesis sent for cytology on 02/14/2020, await biopsy results IR pending placement of Port, likely on 02/18/20 Pain management  Diabetes mellitus type 2 A1c 8.6 SSI, Accu-Cheks, hypoglycemic protocol Hold home oral hypoglycemics  Hypertension BP stable Restart amlodipine at 5 mg, continue to hold home lisinopril  HLD Continue pravastatin  GERD Continue PPI        Malnutrition Type:      Malnutrition Characteristics:      Nutrition Interventions:       Estimated body mass index is 32.96 kg/m as calculated from the following:   Height as of this encounter: 5\' 2"  (1.575 m).   Weight as of this encounter: 81.7 kg.     Code Status: Full  Family Communication: Discussed with daughter at bedside on 02/17/20  Disposition Plan: Status is: Inpatient  Remains inpatient appropriate because:Inpatient level of care appropriate due to severity of illness   Dispo: The patient is from: Home              Anticipated d/c is to: Home              Anticipated d/c date is: 1 day              Patient currently is medically stable to d/c.    Consultants:  Oncology  IR  Procedures:  CT-guided biopsy peritoneal mass on 02/14/2020  Right thoracentesis on 02/14/2020  Antimicrobials:  None  DVT prophylaxis: Heparin [place]   Objective: Vitals:   02/17/20 1000 02/17/20 1009 02/17/20 1125 02/17/20 1332  BP:   (!) 143/86 134/83  Pulse:   (!) 103 92  Resp:      Temp:    99.6 F (37.6 C)  TempSrc:    Oral  SpO2:  93% 99%  Weight:  81.7 kg    Height: 5\' 2"  (1.575 m)       Intake/Output Summary (Last 24 hours) at 02/17/2020 1431 Last data filed at 02/17/2020 1343 Gross per 24 hour  Intake 240 ml  Output --  Net 240 ml   Filed Weights   02/17/20 1009  Weight: 81.7 kg    Exam:  General: NAD   Cardiovascular: S1, S2 present  Respiratory: CTAB  Abdomen: Soft, NT, nondistended, bowel sounds present  Musculoskeletal: No bilateral pedal edema  noted  Skin: Normal  Psychiatry: Normal mood   Data Reviewed: CBC: Recent Labs  Lab 02/13/20 1400 02/14/20 0416 02/15/20 0433 02/16/20 0406 02/17/20 0317  WBC 9.1 8.4 7.0 6.5 7.1  NEUTROABS 6.2  --  4.5 4.0 4.5  HGB 12.9 11.6* 11.7* 12.3 12.6  HCT 40.5 38.1 38.8 39.8 41.1  MCV 79.9* 83.2 83.3 83.3 84.2  PLT 469* 379 377 345 0000000   Basic Metabolic Panel: Recent Labs  Lab 02/13/20 1400 02/14/20 0416 02/15/20 0433 02/16/20 0406 02/17/20 0317  NA 133* 137 134* 138 137  K 4.5 3.8 3.9 3.9 4.1  CL 99 104 104 104 103  CO2 22 22 22 25 26   GLUCOSE 168* 159* 179* 164* 160*  BUN 11 9 8 6 6   CREATININE 0.45 0.40* 0.42* 0.52 0.45  CALCIUM 9.2 8.6* 8.4* 8.7* 8.9   GFR: Estimated Creatinine Clearance: 81.4 mL/min (by C-G formula based on SCr of 0.45 mg/dL). Liver Function Tests: Recent Labs  Lab 02/13/20 1400 02/16/20 0406 02/17/20 0317  AST 14* 9* 12*  ALT 15 11 13   ALKPHOS 49 44 42  BILITOT 0.3 0.4 0.5  PROT 8.5* 7.3 7.3  ALBUMIN 3.7 3.3* 3.3*   Recent Labs  Lab 02/13/20 1400  LIPASE 22   No results for input(s): AMMONIA in the last 168 hours. Coagulation Profile: Recent Labs  Lab 02/14/20 1004  INR 1.2   Cardiac Enzymes: No results for input(s): CKTOTAL, CKMB, CKMBINDEX, TROPONINI in the last 168 hours. BNP (last 3 results) No results for input(s): PROBNP in the last 8760 hours. HbA1C: Recent Labs    02/16/20 0406  HGBA1C 8.6*   CBG: Recent Labs  Lab 02/16/20 1153 02/16/20 1641 02/16/20 2016 02/17/20 0735 02/17/20 1121  GLUCAP 190* 146* 123* 163* 220*   Lipid Profile: No results for input(s): CHOL, HDL, LDLCALC, TRIG, CHOLHDL, LDLDIRECT in the last 72 hours. Thyroid Function Tests: No results for input(s): TSH, T4TOTAL, FREET4, T3FREE, THYROIDAB in the last 72 hours. Anemia Panel: No results for input(s): VITAMINB12, FOLATE, FERRITIN, TIBC, IRON, RETICCTPCT in the last 72 hours. Urine analysis:    Component Value Date/Time   COLORURINE  YELLOW 02/14/2020 0212   APPEARANCEUR CLEAR 02/14/2020 0212   LABSPEC 1.009 02/14/2020 0212   PHURINE 5.0 02/14/2020 0212   GLUCOSEU NEGATIVE 02/14/2020 0212   HGBUR NEGATIVE 02/14/2020 0212   BILIRUBINUR NEGATIVE 02/14/2020 0212   KETONESUR NEGATIVE 02/14/2020 0212   PROTEINUR NEGATIVE 02/14/2020 0212   NITRITE NEGATIVE 02/14/2020 0212   LEUKOCYTESUR NEGATIVE 02/14/2020 0212   Sepsis Labs: @LABRCNTIP (procalcitonin:4,lacticidven:4)  ) Recent Results (from the past 240 hour(s))  SARS Coronavirus 2 by RT PCR (hospital order, performed in [facility] hospital lab) Nasopharyngeal Nasopharyngeal Swab     Status: None   Collection Time: 02/13/20  6:35 PM   Specimen: Nasopharyngeal Swab  Result Value Ref Range Status   SARS Coronavirus 2 NEGATIVE NEGATIVE Final    Comment: (NOTE) SARS-CoV-2 target nucleic  acids are NOT DETECTED. The SARS-CoV-2 RNA is generally detectable in upper and lower respiratory specimens during the acute phase of infection. The lowest concentration of SARS-CoV-2 viral copies this assay can detect is 250 copies / mL. A negative result does not preclude SARS-CoV-2 infection and should not be used as the sole basis for treatment or other patient management decisions.  A negative result may occur with improper specimen collection / handling, submission of specimen other than nasopharyngeal swab, presence of viral mutation(s) within the areas targeted by this assay, and inadequate number of viral copies (<250 copies / mL). A negative result must be combined with clinical observations, patient history, and epidemiological information. Fact Sheet for Patients:   StrictlyIdeas.no Fact Sheet for Healthcare Providers: BankingDealers.co.za This test is not yet approved or cleared  by the [place] FDA and has been authorized for detection and/or diagnosis of SARS-CoV-2 by FDA under an Emergency Use Authorization (EUA).   This EUA will remain in effect (meaning this test can be used) for the duration of the COVID-19 declaration under Section 564(b)(1) of the Act, 21 U.S.C. section 360bbb-3(b)(1), unless the authorization is terminated or revoked sooner. Performed at [facility], [address] [place], [place] [address]       Studies: No results found.  Scheduled Meds: . aspirin EC  81 mg Oral Daily  . heparin  5,000 Units Subcutaneous Q8H  . insulin aspart  0-15 Units Subcutaneous TID WC  . insulin aspart  0-5 Units Subcutaneous QHS  . pantoprazole  40 mg Oral Daily  . polyethylene glycol  17 g Oral Daily  . senna-docusate  1 tablet Oral BID    Continuous Infusions:    LOS: 4 days     Alma Friendly, MD [facility]  If 7PM-7AM, please contact night-coverage [email or web address] 02/17/2020, 2:31 PM

## 2020-02-17 NOTE — Progress Notes (Signed)
[name] seems to be doing pretty well.  She still having some discomfort around the umbilicus where she has this mass.  This was biopsied.  Maybe, there will be some preliminary results tomorrow.  I hope that this was good to be a lymphoma.  However, this might be unlikely given that her LDH is only 83 and that the uric acid is 4.5.  Her blood sugars are doing fairly well right now.  Her lab work today shows a white cell count 7.1.  Hemoglobin 12.6.  Platelet count 353,000.  Her BUN is 16 creatinine 0.45.  Alkaline phosphatase 42.  There is no shortness of breath.  I really think that the cardiac monitor can come off her..  She seems to be eating okay according to her daughter.  Her vital signs are all pretty stable.  Her temperature is 98.3.  Blood pressure 123/69.  Pulse is 90.  Her abdominal exam is somewhat obese.  She does have some tenderness about the umbilicus.  There is no fluid wave.  There is no hepatomegaly.  Cardiac exam regular rate and rhythm.  Her lungs are clear bilaterally.  Extremities shows no clubbing, cyanosis or edema.  Hopefully, the Port-A-Cath will be put in tomorrow.  I would then think that she would be able to go home.  The pain seems to be doing pretty well.  She needs to be on pain medication when she goes home.  We can get the pathology report as an outpatient and I can treat her in the office this week.  I very much appreciate the wonderful care that she is getting from all the staff on [address]. I appreciate their compassion.  I know she is getting outstanding attention.  Lattie Haw, MD  Deuteronomy 20:4

## 2020-02-17 NOTE — Progress Notes (Signed)
Md discussed with pt and daughter concerning possible PAC placement by IR, planned NPO after midnight for tentitive schedule. SRP, RN

## 2020-02-18 ENCOUNTER — Other Ambulatory Visit (HOSPITAL_COMMUNITY): Payer: 59

## 2020-02-18 ENCOUNTER — Inpatient Hospital Stay (HOSPITAL_COMMUNITY): Payer: 59

## 2020-02-18 ENCOUNTER — Encounter: Payer: Self-pay | Admitting: *Deleted

## 2020-02-18 DIAGNOSIS — C7989 Secondary malignant neoplasm of other specified sites: Secondary | ICD-10-CM

## 2020-02-18 HISTORY — PX: IR IMAGING GUIDED PORT INSERTION: IMG5740

## 2020-02-18 LAB — CBC WITH DIFFERENTIAL/PLATELET
Abs Immature Granulocytes: 0.03 10*3/uL (ref 0.00–0.07)
Basophils Absolute: 0 10*3/uL (ref 0.0–0.1)
Basophils Relative: 1 %
Eosinophils Absolute: 0.4 10*3/uL (ref 0.0–0.5)
Eosinophils Relative: 6 %
HCT: 39.4 % (ref 36.0–46.0)
Hemoglobin: 11.9 g/dL — ABNORMAL LOW (ref 12.0–15.0)
Immature Granulocytes: 0 %
Lymphocytes Relative: 26 %
Lymphs Abs: 1.7 10*3/uL (ref 0.7–4.0)
MCH: 25.3 pg — ABNORMAL LOW (ref 26.0–34.0)
MCHC: 30.2 g/dL (ref 30.0–36.0)
MCV: 83.7 fL (ref 80.0–100.0)
Monocytes Absolute: 0.4 10*3/uL (ref 0.1–1.0)
Monocytes Relative: 5 %
Neutro Abs: 4.2 10*3/uL (ref 1.7–7.7)
Neutrophils Relative %: 62 %
Platelets: 339 10*3/uL (ref 150–400)
RBC: 4.71 MIL/uL (ref 3.87–5.11)
RDW: 13.9 % (ref 11.5–15.5)
WBC: 6.8 10*3/uL (ref 4.0–10.5)
nRBC: 0 % (ref 0.0–0.2)

## 2020-02-18 LAB — COMPREHENSIVE METABOLIC PANEL
ALT: 13 U/L (ref 0–44)
AST: 11 U/L — ABNORMAL LOW (ref 15–41)
Albumin: 3.1 g/dL — ABNORMAL LOW (ref 3.5–5.0)
Alkaline Phosphatase: 42 U/L (ref 38–126)
Anion gap: 11 (ref 5–15)
BUN: 6 mg/dL (ref 6–20)
CO2: 25 mmol/L (ref 22–32)
Calcium: 8.8 mg/dL — ABNORMAL LOW (ref 8.9–10.3)
Chloride: 100 mmol/L (ref 98–111)
Creatinine, Ser: 0.41 mg/dL — ABNORMAL LOW (ref 0.44–1.00)
GFR calc Af Amer: >60 mL/min (ref >60)
GFR calc non Af Amer: >60 mL/min (ref >60)
Glucose, Bld: 161 mg/dL — ABNORMAL HIGH (ref 70–99)
Potassium: 4 mmol/L (ref 3.5–5.1)
Sodium: 136 mmol/L (ref 135–145)
Total Bilirubin: 0.8 mg/dL (ref 0.3–1.2)
Total Protein: 7.1 g/dL (ref 6.5–8.1)

## 2020-02-18 LAB — CYTOLOGY - NON PAP

## 2020-02-18 LAB — GLUCOSE, CAPILLARY
Glucose-Capillary: 149 mg/dL — ABNORMAL HIGH (ref 70–99)
Glucose-Capillary: 195 mg/dL — ABNORMAL HIGH (ref 70–99)
Glucose-Capillary: 117 mg/dL — ABNORMAL HIGH (ref 70–99)

## 2020-02-18 MED ORDER — LIDOCAINE-EPINEPHRINE 1 %-1:100000 IJ SOLN
INTRAMUSCULAR | Status: AC
  Filled 2020-02-18: qty 1

## 2020-02-18 MED ORDER — FENTANYL 25 MCG/HR TD PT72: 1.0000 | MEDICATED_PATCH | TRANSDERMAL | 0 refills | Status: DC

## 2020-02-18 MED ORDER — LIDOCAINE-EPINEPHRINE 2 %-1:100000 IJ SOLN
INTRAMUSCULAR | Status: AC | PRN
  Administered 2020-02-18 (×2): 10 mL via INTRADERMAL

## 2020-02-18 MED ORDER — CLINDAMYCIN PHOSPHATE 900 MG/50ML IV SOLN
900.0000 mg | Freq: Once | INTRAVENOUS | Status: AC
  Filled 2020-02-18: qty 50

## 2020-02-18 MED ORDER — MIDAZOLAM HCL 2 MG/2ML IJ SOLN
INTRAMUSCULAR | Status: AC | PRN
  Administered 2020-02-18 (×3): 1 mg via INTRAVENOUS

## 2020-02-18 MED ORDER — ALBUTEROL SULFATE HFA 108 (90 BASE) MCG/ACT IN AERS
INHALATION_SPRAY | RESPIRATORY_TRACT | Status: AC
  Filled 2020-02-18: qty 6.7

## 2020-02-18 MED ORDER — PROPOFOL 10 MG/ML IV BOLUS
INTRAVENOUS | Status: AC
  Filled 2020-02-18: qty 20

## 2020-02-18 MED ORDER — CLINDAMYCIN PHOSPHATE 900 MG/50ML IV SOLN
INTRAVENOUS | Status: AC
  Administered 2020-02-18: 900 mg via INTRAVENOUS
  Filled 2020-02-18: qty 50

## 2020-02-18 MED ORDER — MIDAZOLAM HCL 2 MG/2ML IJ SOLN
INTRAMUSCULAR | Status: AC
  Filled 2020-02-18: qty 2

## 2020-02-18 MED ORDER — FENTANYL CITRATE (PF) 100 MCG/2ML IJ SOLN
INTRAMUSCULAR | Status: AC
  Filled 2020-02-18: qty 2

## 2020-02-18 MED ORDER — CHLORHEXIDINE GLUCONATE CLOTH 2 % EX PADS: 6.0000 | MEDICATED_PAD | Freq: Every day | CUTANEOUS | Status: DC

## 2020-02-18 MED ORDER — ONDANSETRON HCL 4 MG PO TABS: 4.0000 mg | ORAL_TABLET | Freq: Four times a day (QID) | ORAL | 0 refills | Status: DC | PRN

## 2020-02-18 MED ORDER — FENTANYL 25 MCG/HR TD PT72
1.0000 | MEDICATED_PATCH | TRANSDERMAL | Status: DC
  Administered 2020-02-18: 1 via TRANSDERMAL
  Filled 2020-02-18: qty 1

## 2020-02-18 MED ORDER — HEPARIN SOD (PORK) LOCK FLUSH 100 UNIT/ML IV SOLN
INTRAVENOUS | Status: AC
  Filled 2020-02-18: qty 5

## 2020-02-18 MED ORDER — SENNOSIDES-DOCUSATE SODIUM 8.6-50 MG PO TABS: 1.0000 | ORAL_TABLET | Freq: Two times a day (BID) | ORAL | 0 refills | Status: AC

## 2020-02-18 MED ORDER — HEPARIN SOD (PORK) LOCK FLUSH 100 UNIT/ML IV SOLN
INTRAVENOUS | Status: AC | PRN
  Administered 2020-02-18: 500 [IU] via INTRAVENOUS

## 2020-02-18 MED ORDER — FENTANYL CITRATE (PF) 100 MCG/2ML IJ SOLN
INTRAMUSCULAR | Status: AC | PRN
  Administered 2020-02-18 (×2): 50 ug via INTRAVENOUS

## 2020-02-18 MED ORDER — PROPOFOL 500 MG/50ML IV EMUL
INTRAVENOUS | Status: AC
  Filled 2020-02-18: qty 50

## 2020-02-18 MED ORDER — POLYETHYLENE GLYCOL 3350 17 G PO PACK: 17.0000 g | PACK | Freq: Every day | ORAL | 0 refills | Status: AC | PRN

## 2020-02-18 MED ORDER — OXYCODONE HCL 5 MG PO TABS: 5.0000 mg | ORAL_TABLET | ORAL | 0 refills | Status: DC | PRN

## 2020-02-18 NOTE — Progress Notes (Signed)
Referring Physician(s): Ennever,P  Supervising Physician: Jacqulynn Cadet  Patient Status:  [name] - In-pt  Chief Complaint:  Pancreatic cancer/abdominal pain  Subjective: Patient familiar to IR service from peritoneal mass biopsy and right thoracentesis on 02/14/2020.  Pathology pending.  She has a history of diabetes, hypertension, pancreatic cancer diagnosed approximately 1 month ago currently undergoing evaluation for possible partial pancreatectomy with Dr. Barry Dienes.  She continues to have persistent abdominal pain along with some intermittent nausea.  Request now received for Port-A-Cath placement for planned chemotherapy.  She currently denies fever, headache, chest pain, dyspnea, cough, back pain, vomiting or bleeding.  Past Medical History:  Diagnosis Date  . Hyperlipidemia   . Hypertension   . Type 2 diabetes mellitus ([place])    History reviewed. No pertinent surgical history.    Allergies: Ivp dye [iodinated diagnostic agents], Morphine and related, and Penicillins  Medications: Prior to Admission medications   Medication Sig Start Date End Date Taking? Authorizing Provider  albuterol (VENTOLIN HFA) 108 (90 Base) MCG/ACT inhaler Inhale 1-2 puffs into the lungs every 6 (six) hours as needed for wheezing or shortness of breath.   Yes [provider]  amLODipine (NORVASC) 10 MG tablet Take 10 mg by mouth daily. 12/27/19  Yes [provider]  ergocalciferol (VITAMIN D2) 1.25 MG (50000 UT) capsule Take 50,000 Units by mouth once a week.  08/25/19  Yes [provider]  glipiZIDE (GLUCOTROL) 10 MG tablet Take 10 mg by mouth 2 (two) times daily. 08/25/19  Yes [provider]  lisinopril (ZESTRIL) 10 MG tablet Take 10 mg by mouth daily. 12/27/19  Yes [provider]  metFORMIN (GLUCOPHAGE-XR) 500 MG 24 hr tablet Take 500 mg by mouth in the morning and at bedtime.   Yes [provider]  omeprazole (PRILOSEC) 40 MG capsule Take 40 mg  by mouth daily. 08/20/19  Yes [provider]  pravastatin (PRAVACHOL) 40 MG tablet Take 40 mg by mouth daily. 12/27/19  Yes [provider]     Vital Signs: BP 127/72 (BP Location: Right Arm)   Pulse 93   Temp 98.2 F (36.8 C) (Oral)   Resp 16   Ht _0  (1.575 m)   Wt 180 lb 3.2 oz (81.7 kg)   SpO2 92%   BMI 32.96 kg/m   Physical Exam awake, alert.  Chest with slightly diminished breath sounds right base, left clear.  Heart with regular rate and rhythm.  Abdomen soft, positive bowel sounds, tender periumbilical region to palpation /palpable mass noted near umbilicus.  No significant lower extremity edema.  Imaging: CT ASPIRATION  Result Date: 02/14/2020 INDICATION: 54 year old female with a history pancreatic cancer with right pleural effusion EXAM: IMAGE GUIDED THORACENTESIS MEDICATIONS: The patient is currently admitted to the hospital and receiving intravenous antibiotics. The antibiotics were administered within an appropriate time frame prior to the initiation of the procedure. ANESTHESIA/SEDATION: None COMPLICATIONS: None PROCEDURE: Informed written consent was obtained from the patient after a thorough discussion of the procedural risks, benefits and alternatives. All questions were addressed. Maximal Sterile Barrier Technique was utilized including caps, mask, sterile gowns, sterile gloves, sterile drape, hand hygiene and skin antiseptic. A timeout was performed prior to the initiation of the procedure. Patient was positioned in a seating position on the CT gantry table. Images of the right posterior chest were performed with images stored sent to PACs. The patient was then prepped and draped in the usual sterile fashion. 1% lidocaine was used for local anesthesia.  Safe-T-Centesis kit was used to perform ultrasound-guided thoracentesis. Scant fluid was removed, with a sample sent for cytology. Needle was removed. Patient tolerated the procedure well and remained  hemodynamically stable throughout. No complications were encountered and no significant blood loss. IMPRESSION: Status post ultrasound-guided thoracentesis of right pleural fluid. Sample was sent for cytology. Signed, Dulcy Fanny. Dellia Nims, RPVI [facility] Radiology Electronically Signed   By: Corrie Mckusick D.O.   On: 02/14/2020 14:34   CT BIOPSY  Result Date: 02/14/2020 INDICATION: 54 year old female with a history pancreatic cancer, referred for biopsy of peritoneal mass EXAM: CT BIOPSY MEDICATIONS: None. ANESTHESIA/SEDATION: Moderate (conscious) sedation was employed during this procedure. A total of Versed 2.0 mg and Fentanyl 100 mcg was administered intravenously. Moderate Sedation Time: 10 minutes. The patient's level of consciousness and vital signs were monitored continuously by radiology nursing throughout the procedure under my direct supervision. FLUOROSCOPY TIME:  CT COMPLICATIONS: None PROCEDURE: Informed written consent was obtained from the patient after a thorough discussion of the procedural risks, benefits and alternatives. All questions were addressed. Maximal Sterile Barrier Technique was utilized including caps, mask, sterile gowns, sterile gloves, sterile drape, hand hygiene and skin antiseptic. A timeout was performed prior to the initiation of the procedure. Patient was positioned in the supine position on the CT gantry table and a scout CT of the abdomen was performed for planning purposes. Once angle of approach was determined, the skin and subcutaneous tissues this scan was prepped and draped in the usual sterile fashion, and a sterile drape was applied covering the operative field. A sterile gown and sterile gloves were used for the procedure. Local anesthesia was provided with 1% Lidocaine. The skin and subcutaneous tissues were infiltrated 1% lidocaine for local anesthesia,. Guide needle was advanced with CT guidance into the  mesenteric mass, at the level of the umbilicus. After confirmation of the tip, separate 18 gauge core biopsies were performed. These were placed into solution for transportation to the lab. Patient tolerated the procedure well and remained hemodynamically stable throughout. No complications were encountered and no significant blood loss was encounter IMPRESSION: Status post CT-guided biopsy of peritoneal mass at the umbilicus. Signed, Dulcy Fanny. Dellia Nims, RPVI [facility] Radiology Electronically Signed   By: Corrie Mckusick D.O.   On: 02/14/2020 14:01   DG Chest Port 1 View  Result Date: 02/14/2020 CLINICAL DATA:  54 year old female status post right-sided thoracentesis. Known pancreatic cancer. EXAM: PORTABLE CHEST 1 VIEW COMPARISON:  CT of the chest abdomen pelvis dated 02/13/2020. FINDINGS: Small right pleural effusion with extension into the minor fissure. There is associated compressive atelectasis of the right lung base. Pneumonia is not excluded. Clinical correlation is recommended. The left lung is clear. There is no pneumothorax. Top-normal cardiac size. No acute osseous pathology. IMPRESSION: Small right pleural effusion with associated compressive atelectasis of the right lung base. No pneumothorax post thoracentesis. Electronically Signed   By: Anner Crete M.D.   On: 02/14/2020 15:11    Labs:  CBC: Recent Labs    02/15/20 0433 02/16/20 0406 02/17/20 0317 02/18/20 0321  WBC 7.0 6.5 7.1 6.8  HGB 11.7* 12.3 12.6 11.9*  HCT 38.8 39.8 41.1 39.4  PLT 377 345 353 339    COAGS: Recent Labs    02/14/20 1004  INR 1.2    BMP: Recent Labs    02/15/20 0433 02/16/20 0406 02/17/20 0317 02/18/20 0321  NA 134* 138 137 136  K  3.9 3.9 4.1 4.0  CL 104 104 103 100  CO2 _0 GLUCOSE 179* 164* 160* 161*  BUN _1 CALCIUM 8.4* 8.7* 8.9 8.8*  CREATININE 0.42* 0.52 0.45 0.41*  GFRNONAA >60 >60 >60 >60  GFRAA >60 >60 >60  >60    LIVER FUNCTION TESTS: Recent Labs    02/13/20 1400 02/16/20 0406 02/17/20 0317 02/18/20 0321  BILITOT 0.3 0.4 0.5 0.8  AST 14* 9* 12* 11*  ALT _2 ALKPHOS 49 44 42 42  PROT 8.5* 7.3 7.3 7.1  ALBUMIN 3.7 3.3* 3.3* 3.1*    Assessment and Plan: Pt with history of diabetes, hypertension, pancreatic cancer diagnosed approximately 1 month ago currently undergoing evaluation for possible partial pancreatectomy with Dr. Barry Dienes.  She continues to have persistent abdominal pain along with some intermittent nausea.  Request now received for Port-A-Cath placement for planned chemotherapy. Risks and benefits of image guided port-a-catheter placement was discussed with the patient /daughter including, but not limited to bleeding, infection, pneumothorax, or fibrin sheath development and need for additional procedures.  All of the patient's questions were answered, patient is agreeable to proceed. Consent signed and in chart.  Procedure tent planned for this afternoon   Electronically Signed: D. Rowe Robert, PA-C 02/18/2020, 11:03 AM   I spent a total of 25 minutes at the the patient's bedside AND on the patient's hospital floor or unit, greater than 50% of which was counseling/coordinating care for port a cath placement    Patient ID: Angela Wilson, female   DOB: [birth year], 54 y.o.   MRN: [id number]

## 2020-02-18 NOTE — TOC Progression Note (Signed)
Transition of Care [facility]) - Progression Note    Patient Details  Name: Angela Wilson MRN: [id number] Date of Birth: 09/01/1966  Transition of Care [facility]) CM/SW Contact  Purcell Mouton, RN Phone Number: 02/18/2020, 2:02 PM  Clinical Narrative:     Pt plan to discharge after Port-A-Cath is placed to home with no needs at present time.   Expected Discharge Plan: Home/Self Care Barriers to Discharge: No Barriers Identified  Expected Discharge Plan and Services Expected Discharge Plan: Home/Self Care   Discharge Planning Services: CM Consult   Living arrangements for the past 2 months: Single Family Home                                       Social Determinants of Health (SDOH) Interventions    Readmission Risk Interventions No flowsheet data found.

## 2020-02-18 NOTE — Progress Notes (Signed)
Pt discharged to home, instructions reviewed with pt and daughter. Acknowledged understanding. SRP< RN

## 2020-02-18 NOTE — Discharge Summary (Signed)
Discharge Summary  Angela Wilson YKZ:[id number] DOB: 10/24/1965  PCP: No primary care provider on file.  Admit date: 02/13/2020 Discharge date: 02/18/2020  Time spent: 40 mins  Recommendations for Outpatient Follow-up:  1. Follow-up with oncology as scheduled-Dr. Marin Olp following patient very closely    Discharge Diagnoses:  Active Hospital Problems   Diagnosis Date Noted  . Pancreatic cancer ([place]) 02/14/2020  . 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    Discharge Condition: Stable  Diet recommendation: As tolerated  Vitals:   02/18/20 1605 02/18/20 1610  BP: 119/71 124/71  Pulse: (!) 101 (!) 106  Resp: 16 (!) 23  Temp:    SpO2: 96% 96%    History of present illness:  Angela Abdelrahmanis a 54 y.o.femalewith medical history significant for diabetes, hypertension, pancreatic cancer diagnosed approximately 1one month ago with localized disease and is currently undergoing evaluation in preparation forpossible partial pancreectomy with Dr. Barry Dienes.Patient nowpresents to the ed due to progressive abdominal pain that is keeping her awake at night. She notes pain in diffuse but more intense inlower abdomen and right upper quadrantportion of her abdomen she describe pain as in the skin and not deep pain. She notesminimalassociated nausea with her symptoms and per daughter is tolerating po, although does have early satiety. Denies any other symptoms. In the ED, VSS, labs fairly unremarkable. CT abd/pelvis showed concern for enlarged pancreatic mass with extensive omental and peritoneal metastatic involvement of the upper abdomen and pelvis with a 8 cm mass to the left of the umbilicus, with pleural effusion. Case was discussed with Dr Annamaria Boots Yvonna Alanis, who recommended admission for pain control and further staging in order to clarify management, given progressive disease.    Today, patient continues to complain of  abdominal pain controlled with pain meds, denies any nausea/vomiting, fever/chills, chest pain, shortness of breath.  Daughter at bedside, discussed extensively about discharge plans including close follow-up with Dr. Marin Olp from oncology for further management.     Hospital Course:  Active Problems:   Cancer associated pain   Pancreatic cancer ([place])  ?Possible lymphoma versus metastatic pancreatic cancer with intractable pain CT abdomen showed concern for enlarged pancreatic mass with extensive omental and peritoneal involvement, right-sided pleural effusion Ca 19-9 WNL Oncology on board, appreciate recs IR on board, status post CT-guided biopsy of peritoneal mass, and status post ultrasound-guided right thoracentesis sent for cytology on 02/14/2020, await biopsy results Status post port placement by IR on 02/18/2020 Pain management, bowel regimen  Diabetes mellitus type 2 A1c 8.6 Continue home regimen  Hypertension BP stable Continue home regimen  HLD Continue pravastatin  GERD Continue PPI       Malnutrition Type:      Malnutrition Characteristics:      Nutrition Interventions:      Estimated body mass index is 32.96 kg/m as calculated from the following:   Height as of this encounter: _0  (1.575 m).   Weight as of this encounter: 81.7 kg.    Procedures:  CT-guided biopsy peritoneal mass on 02/14/2020  Right thoracentesis on 02/14/2020  Port placement by IR on 02/18/2020  Consultations:  Oncology  IR  Discharge Exam: BP 124/71   Pulse (!) 106   Temp 98.4 F (36.9 C) (Oral)   Resp (!) 23   Ht _1  (1.575 m)   Wt 81.7 kg   SpO2 96%   BMI 32.96 kg/m   General: NAD Cardiovascular: S1, S2 present Respiratory: CTA  B Abdomen: Soft, minimally tender around periumbilical area, obese, bowel sounds present     Discharge Instructions You were cared for by a hospitalist during your hospital stay. If you have any questions about your  discharge medications or the care you received while you were in the hospital after you are discharged, you can call the unit and asked to speak with the hospitalist on call if the hospitalist that took care of you is not available. Once you are discharged, your primary care physician will handle any further medical issues. Please note that NO REFILLS for any discharge medications will be authorized once you are discharged, as it is imperative that you return to your primary care physician (or establish a relationship with a primary care physician if you do not have one) for your aftercare needs so that they can reassess your need for medications and monitor your lab values.  Discharge Instructions    Diet - low sodium heart healthy   Complete by: As directed    Increase activity slowly   Complete by: As directed      Allergies as of 02/18/2020      Reactions   Ivp Dye [iodinated Diagnostic Agents] Other (See Comments)   unk   Morphine And Related Other (See Comments)   unk   Penicillins Other (See Comments)   unk      Medication List    TAKE these medications   albuterol 108 (90 Base) MCG/ACT inhaler Commonly known as: VENTOLIN HFA Inhale 1-2 puffs into the lungs every 6 (six) hours as needed for wheezing or shortness of breath.   amLODipine 10 MG tablet Commonly known as: NORVASC Take 10 mg by mouth daily.   ergocalciferol 1.25 MG (50000 UT) capsule Commonly known as: VITAMIN D2 Take 50,000 Units by mouth once a week.   fentaNYL 25 MCG/HR Commonly known as: [place] 1 patch onto the skin every 3 (three) days. Start taking on: February 21, 2020   glipiZIDE 10 MG tablet Commonly known as: GLUCOTROL Take 10 mg by mouth 2 (two) times daily.   lisinopril 10 MG tablet Commonly known as: ZESTRIL Take 10 mg by mouth daily.   metFORMIN 500 MG 24 hr tablet Commonly known as: GLUCOPHAGE-XR Take 500 mg by mouth in the morning and at bedtime.   omeprazole 40 MG  capsule Commonly known as: PRILOSEC Take 40 mg by mouth daily.   ondansetron 4 MG tablet Commonly known as: ZOFRAN Take 1 tablet (4 mg total) by mouth every 6 (six) hours as needed for nausea.   oxyCODONE 5 MG immediate release tablet Commonly known as: Roxicodone Take 1-2 tablets (5-10 mg total) by mouth every 4 (four) hours as needed for severe pain.   polyethylene glycol 17 g packet Commonly known as: MIRALAX / GLYCOLAX Take 17 g by mouth daily as needed.   pravastatin 40 MG tablet Commonly known as: PRAVACHOL Take 40 mg by mouth daily.   senna-docusate 8.6-50 MG tablet Commonly known as: Senokot-S Take 1 tablet by mouth 2 (two) times daily.      Allergies  Allergen Reactions  . Ivp Dye [Iodinated Diagnostic Agents] Other (See Comments)    unk  . Morphine And Related Other (See Comments)    unk  . Penicillins Other (See Comments)    unk      The results of significant diagnostics from this hospitalization (including imaging, microbiology, ancillary and laboratory) are listed below for reference.    Significant Diagnostic Studies: CT  ABDOMEN PELVIS WO CONTRAST  Result Date: 02/13/2020 CLINICAL DATA:  Persistent cough. Known pancreatic cancer. Abdominal pain. EXAM: CT CHEST, ABDOMEN AND PELVIS WITHOUT CONTRAST TECHNIQUE: Multidetector CT imaging of the chest, abdomen and pelvis was performed following the standard protocol without IV contrast. COMPARISON:  None. FINDINGS: CT CHEST FINDINGS Cardiovascular: The heart is normal in size. No pericardial effusion. The aorta is normal in caliber. Minimal scattered aortic calcifications. Mediastinum/Nodes: Scattered mediastinal lymph nodes without obvious mass or adenopathy. Small epicardial nodes are noted. The esophagus is grossly normal. The thyroid gland is grossly normal. Lungs/Pleura: Moderate-sized right pleural effusion is noted. There is moderate overlying atelectasis. No definite pulmonary nodules to suggest pulmonary  metastatic disease. Moderate eventration of the right hemidiaphragm with some overlying vascular crowding and atelectasis. Musculoskeletal: No breast masses, supraclavicular or axillary adenopathy. The bony thorax is intact. No obvious bone lesions. CT ABDOMEN PELVIS FINDINGS Hepatobiliary: No obvious hepatic lesions are identified without contrast. Pancreas: 5.5 x 3.9 cm soft tissue mass is noted in the pancreatic tail region. Spleen: Normal size.  No focal lesions. Adrenals/Urinary Tract: Adrenal glands and kidneys are grossly normal. The bladder is unremarkable. Stomach/Bowel: The stomach, duodenum, small bowel and colon are grossly normal. No obvious inflammatory process, mass lesions or obstructive findings. Some high attenuation material is noted in the cecum and could be related to ingested material or prior scan. Vascular/Lymphatic: The aorta is normal in caliber. No atherosclerotic calcifications. Small scattered mesenteric and retroperitoneal lymph nodes are noted. Unfortunately, there is extensive omental disease and peritoneal implants in the abdomen and upper pelvis. Numerous soft tissue masses are noted with a large conglomerate mass noted anteriorly at the level of the umbilicus which measures a maximum of 8 cm. Small amount of free pelvic fluid is noted. There are small pelvic sidewall lymph nodes. Reproductive: No significant findings. Other: No inguinal mass or adenopathy.  No subcutaneous lesions. Musculoskeletal: No significant bony findings. No worrisome bone lesions. 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 IMPRESSION: 1. 5.5 x 3.9 cm pancreatic tail mass. 2. 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 3. Moderate-sized right pleural effusion and overlying atelectasis. 4. No definite findings for pulmonary metastatic disease. Electronically Signed   By: Marijo Sanes M.D.   On: 02/13/2020 17:04   CT Chest Wo  Contrast  Result Date: 02/13/2020 CLINICAL DATA:  Persistent cough. Known pancreatic cancer. Abdominal pain. EXAM: CT CHEST, ABDOMEN AND PELVIS WITHOUT CONTRAST TECHNIQUE: Multidetector CT imaging of the chest, abdomen and pelvis was performed following the standard protocol without IV contrast. COMPARISON:  None. FINDINGS: CT CHEST FINDINGS Cardiovascular: The heart is normal in size. No pericardial effusion. The aorta is normal in caliber. Minimal scattered aortic calcifications. Mediastinum/Nodes: Scattered mediastinal lymph nodes without obvious mass or adenopathy. Small epicardial nodes are noted. The esophagus is grossly normal. The thyroid gland is grossly normal. Lungs/Pleura: Moderate-sized right pleural effusion is noted. There is moderate overlying atelectasis. No definite pulmonary nodules to suggest pulmonary metastatic disease. Moderate eventration of the right hemidiaphragm with some overlying vascular crowding and atelectasis. Musculoskeletal: No breast masses, supraclavicular or axillary adenopathy. The bony thorax is intact. No obvious bone lesions. CT ABDOMEN PELVIS FINDINGS Hepatobiliary: No obvious hepatic lesions are identified without contrast. Pancreas: 5.5 x 3.9 cm soft tissue mass is noted in the pancreatic tail region. Spleen: Normal size.  No focal lesions. Adrenals/Urinary Tract: Adrenal glands and kidneys are grossly normal. The bladder is unremarkable.  Stomach/Bowel: The stomach, duodenum, small bowel and colon are grossly normal. No obvious inflammatory process, mass lesions or obstructive findings. Some high attenuation material is noted in the cecum and could be related to ingested material or prior scan. Vascular/Lymphatic: The aorta is normal in caliber. No atherosclerotic calcifications. Small scattered mesenteric and retroperitoneal lymph nodes are noted. Unfortunately, there is extensive omental disease and peritoneal implants in the abdomen and upper pelvis. Numerous soft  tissue masses are noted with a large conglomerate mass noted anteriorly at the level of the umbilicus which measures a maximum of 8 cm. Small amount of free pelvic fluid is noted. There are small pelvic sidewall lymph nodes. Reproductive: No significant findings. Other: No inguinal mass or adenopathy.  No subcutaneous lesions. Musculoskeletal: No significant bony findings. No worrisome bone lesions. 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 IMPRESSION: 1. 5.5 x 3.9 cm pancreatic tail mass. 2. 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 3. Moderate-sized right pleural effusion and overlying atelectasis. 4. No definite findings for pulmonary metastatic disease. Electronically Signed   By: Marijo Sanes M.D.   On: 02/13/2020 17:04   CT ASPIRATION  Result Date: 02/14/2020 INDICATION: 54 year old female with a history pancreatic cancer with right pleural effusion EXAM: IMAGE GUIDED THORACENTESIS MEDICATIONS: The patient is currently admitted to the hospital and receiving intravenous antibiotics. The antibiotics were administered within an appropriate time frame prior to the initiation of the procedure. ANESTHESIA/SEDATION: None COMPLICATIONS: None PROCEDURE: Informed written consent was obtained from the patient after a thorough discussion of the procedural risks, benefits and alternatives. All questions were addressed. Maximal Sterile Barrier Technique was utilized including caps, mask, sterile gowns, sterile gloves, sterile drape, hand hygiene and skin antiseptic. A timeout was performed prior to the initiation of the procedure. Patient was positioned in a seating position on the CT gantry table. Images of the right posterior chest were performed with images stored sent to PACs. The patient was then prepped and draped in the usual sterile fashion. 1% lidocaine was used for local anesthesia. Safe-T-Centesis kit was used to perform  ultrasound-guided thoracentesis. Scant fluid was removed, with a sample sent for cytology. Needle was removed. Patient tolerated the procedure well and remained hemodynamically stable throughout. No complications were encountered and no significant blood loss. IMPRESSION: Status post ultrasound-guided thoracentesis of right pleural fluid. Sample was sent for cytology. Signed, Dulcy Fanny. Dellia Nims, RPVI [facility] Radiology Electronically Signed   By: Corrie Mckusick D.O.   On: 02/14/2020 14:34   CT BIOPSY  Result Date: 02/14/2020 INDICATION: 54 year old female with a history pancreatic cancer, referred for biopsy of peritoneal mass EXAM: CT BIOPSY MEDICATIONS: None. ANESTHESIA/SEDATION: Moderate (conscious) sedation was employed during this procedure. A total of Versed 2.0 mg and Fentanyl 100 mcg was administered intravenously. Moderate Sedation Time: 10 minutes. The patient's level of consciousness and vital signs were monitored continuously by radiology nursing throughout the procedure under my direct supervision. FLUOROSCOPY TIME:  CT COMPLICATIONS: None PROCEDURE: Informed written consent was obtained from the patient after a thorough discussion of the procedural risks, benefits and alternatives. All questions were addressed. Maximal Sterile Barrier Technique was utilized including caps, mask, sterile gowns, sterile gloves, sterile drape, hand hygiene and skin antiseptic. A timeout was performed prior to the initiation of the procedure. Patient was positioned in the supine position on the CT gantry table and a scout CT of the abdomen was performed for  planning purposes. Once angle of approach was determined, the skin and subcutaneous tissues this scan was prepped and draped in the usual sterile fashion, and a sterile drape was applied covering the operative field. A sterile gown and sterile gloves were used for the procedure. Local anesthesia was provided with  1% Lidocaine. The skin and subcutaneous tissues were infiltrated 1% lidocaine for local anesthesia,. Guide needle was advanced with CT guidance into the mesenteric mass, at the level of the umbilicus. After confirmation of the tip, separate 18 gauge core biopsies were performed. These were placed into solution for transportation to the lab. Patient tolerated the procedure well and remained hemodynamically stable throughout. No complications were encountered and no significant blood loss was encounter IMPRESSION: Status post CT-guided biopsy of peritoneal mass at the umbilicus. Signed, Dulcy Fanny. Dellia Nims, RPVI [facility] Radiology Electronically Signed   By: Corrie Mckusick D.O.   On: 02/14/2020 14:01   DG Chest Port 1 View  Result Date: 02/14/2020 CLINICAL DATA:  54 year old female status post right-sided thoracentesis. Known pancreatic cancer. EXAM: PORTABLE CHEST 1 VIEW COMPARISON:  CT of the chest abdomen pelvis dated 02/13/2020. FINDINGS: Small right pleural effusion with extension into the minor fissure. There is associated compressive atelectasis of the right lung base. Pneumonia is not excluded. Clinical correlation is recommended. The left lung is clear. There is no pneumothorax. Top-normal cardiac size. No acute osseous pathology. IMPRESSION: Small right pleural effusion with associated compressive atelectasis of the right lung base. No pneumothorax post thoracentesis. Electronically Signed   By: Anner Crete M.D.   On: 02/14/2020 15:11    Microbiology: Recent Results (from the past 240 hour(s))  SARS Coronavirus 2 by RT PCR (hospital order, performed in [facility] hospital lab) Nasopharyngeal Nasopharyngeal Swab     Status: None   Collection Time: 02/13/20  6:35 PM   Specimen: Nasopharyngeal Swab  Result Value Ref Range Status   SARS Coronavirus 2 NEGATIVE NEGATIVE Final    Comment: (NOTE) SARS-CoV-2 target nucleic acids are NOT  DETECTED. The SARS-CoV-2 RNA is generally detectable in upper and lower respiratory specimens during the acute phase of infection. The lowest concentration of SARS-CoV-2 viral copies this assay can detect is 250 copies / mL. A negative result does not preclude SARS-CoV-2 infection and should not be used as the sole basis for treatment or other patient management decisions.  A negative result may occur with improper specimen collection / handling, submission of specimen other than nasopharyngeal swab, presence of viral mutation(s) within the areas targeted by this assay, and inadequate number of viral copies (<250 copies / mL). A negative result must be combined with clinical observations, patient history, and epidemiological information. Fact Sheet for Patients:   StrictlyIdeas.no Fact Sheet for Healthcare Providers: BankingDealers.co.za This test is not yet approved or cleared  by the [place] FDA and has been authorized for detection and/or diagnosis of SARS-CoV-2 by FDA under an Emergency Use Authorization (EUA).  This EUA will remain in effect (meaning this test can be used) for the duration of the COVID-19 declaration under Section 564(b)(1) of the Act, 21 U.S.C. section 360bbb-3(b)(1), unless the authorization is terminated or revoked sooner. Performed at [facility], [address] [place], [place] [address]      Labs: Basic Metabolic Panel: Recent Labs  Lab 02/14/20 0416 02/15/20 0433 02/16/20 0406 02/17/20 0317 02/18/20 0321  NA 137 134* 138 137 136  K 3.8 3.9 3.9 4.1 4.0  CL 104 104 104 103 100  CO2 _0 GLUCOSE 159* 179* 164* 160* 161*  BUN _1 CREATININE 0.40* 0.42* 0.52 0.45 0.41*  CALCIUM 8.6* 8.4* 8.7* 8.9 8.8*   Liver Function Tests: Recent Labs  Lab 02/13/20 1400 02/16/20 0406 02/17/20 0317 02/18/20 0321  AST 14* 9* 12* 11*  ALT _2 ALKPHOS 49 44 42 42   BILITOT 0.3 0.4 0.5 0.8  PROT 8.5* 7.3 7.3 7.1  ALBUMIN 3.7 3.3* 3.3* 3.1*   Recent Labs  Lab 02/13/20 1400  LIPASE 22   No results for input(s): AMMONIA in the last 168 hours. CBC: Recent Labs  Lab 02/13/20 1400 02/13/20 1400 02/14/20 0416 02/15/20 0433 02/16/20 0406 02/17/20 0317 02/18/20 0321  WBC 9.1   < > 8.4 7.0 6.5 7.1 6.8  NEUTROABS 6.2  --   --  4.5 4.0 4.5 4.2  HGB 12.9   < > 11.6* 11.7* 12.3 12.6 11.9*  HCT 40.5   < > 38.1 38.8 39.8 41.1 39.4  MCV 79.9*   < > 83.2 83.3 83.3 84.2 83.7  PLT 469*   < > 379 377 345 353 339   < > = values in this interval not displayed.   Cardiac Enzymes: No results for input(s): CKTOTAL, CKMB, CKMBINDEX, TROPONINI in the last 168 hours. BNP: BNP (last 3 results) No results for input(s): BNP in the last 8760 hours.  ProBNP (last 3 results) No results for input(s): PROBNP in the last 8760 hours.  CBG: Recent Labs  Lab 02/17/20 1121 02/17/20 1724 02/17/20 2104 02/18/20 0747 02/18/20 1136  GLUCAP 220* 125* 160* 149* 195*       Signed:  Alma Friendly, MD [facility] 02/18/2020, 5:20 PM

## 2020-02-18 NOTE — Progress Notes (Signed)
Oncology Nurse Navigator Documentation  Oncology Nurse Navigator Flowsheets 02/18/2020  Confirmed Diagnosis Date -  Diagnosis Status -  Navigator Follow Up Date: -  Navigator Follow Up Reason: -  Navigator Location [facility]  Referral Date to RadOnc/MedOnc -  Navigator Encounter Type MyChart;Appt/Treatment Plan Review;Other:  Telephone -  Patient Visit Type MedOnc  Treatment Phase Pre-Tx/Tx Discussion  Barriers/Navigation Needs Anxiety;Morbidities/Frailty;Multiple Hospital Admissions;Family Concerns  Education -  Interventions Psycho-Social Support;Education  Acuity Level 4-High Needs (Greater Than 4 Barriers Identified)  Referrals -  Coordination of Care -  Education Method Written  Support Groups/Services Friends and Family  Time Spent with Patient 54

## 2020-02-18 NOTE — Procedures (Signed)
Interventional Radiology Procedure Note  Procedure: Placement of a right IJ approach single lumen PowerPort.  Tip is positioned at the superior cavoatrial junction and catheter is ready for immediate use.  Complications: No immediate Recommendations:  - Ok to shower tomorrow - Do not submerge for 7 days - Routine line care   Signed,  Deloise Marchant K. Salayah Meares, MD   

## 2020-02-18 NOTE — Progress Notes (Signed)
[name] is doing okay this morning though she is having some abdominal pain.  This is the periumbilical pain from this tumor that she has.  I think that we should try her on a Duragesic patch.  This might be helpful to help with chronic pain that she is likely going to have.  She is going to get the Port-A-Cath placed today.  Hopefully after that she can go home.  I doubt we will get the pathology back yet today.  Maybe we get a preliminary.  She is not eating all that much.  She is not to thrilled with the food that the hospital service.  This might be a cultural issue.  Her blood sugars have been on the higher side but that really does not bother me.  Her white cell count is 6.8.  Hemoglobin 12.  Platelet count 340,000.  I did send off CEA and CA-125 levels yesterday.  They are not yet back.  I think she is out of bed.  She is a little constipated.  If she goes home, she must have MiraLAX twice a day.  Her vital signs are all pretty stable.  Temperature is 98.2.  Pulse 93.  Blood pressure 127/72.  Her abdomen is obese but soft.  There is some tenderness about the umbilicus.  The biopsy site looks intact.  She has a couple areas of ecchymoses from the heparin injections.  Bowel sounds are present.  Extremities shows no swelling.  Lungs are clear.  Again, I would like to hope that she will go home today.  She will get the Port-A-Cath placed first.  I suspect the pathology will not be back until Wednesday.  We can certainly treat her in the office.  I know that she is gone fantastic care from all the staff upon [facility].  Now that this is the Progressive Care unit, I am sure that the professionalism and compassion will be incredibly high and set the bar for the other floors.  Lattie Haw, MD  Psalm 107:8

## 2020-02-18 NOTE — Plan of Care (Signed)
  Problem: Health Behavior/Discharge Planning: Goal: Ability to manage health-related needs will improve Outcome: Progressing   Problem: Clinical Measurements: Goal: Ability to maintain clinical measurements within normal limits will improve Outcome: Progressing   

## 2020-02-19 ENCOUNTER — Other Ambulatory Visit: Payer: Self-pay | Admitting: Hematology & Oncology

## 2020-02-19 ENCOUNTER — Encounter: Payer: Self-pay | Admitting: *Deleted

## 2020-02-19 ENCOUNTER — Encounter: Payer: Self-pay | Admitting: Hematology & Oncology

## 2020-02-19 LAB — CERULOPLASMIN: Ceruloplasmin: 37.4 mg/dL (ref 19.0–39.0)

## 2020-02-19 LAB — SURGICAL PATHOLOGY

## 2020-02-19 NOTE — Progress Notes (Signed)
Pharmacist Chemotherapy Monitoring - Initial Assessment    Anticipated start date: 02/25/20  Regimen:  . Are orders appropriate based on the patient's diagnosis, regimen, and cycle? Yes . Does the plan date match the patient's scheduled date? Yes . Is the sequencing of drugs appropriate? Yes . Are the premedications appropriate for the patient's regimen? Yes . Prior Authorization for treatment is: Pending o If applicable, is the correct biosimilar selected based on the patient's insurance? not applicable  Organ Function and Labs: Marland Kitchen Are dose adjustments needed based on the patient's renal function, hepatic function, or hematologic function? No . Are appropriate labs ordered prior to the start of patient's treatment? Yes . Other organ system assessment, if indicated: N/A . The following baseline labs, if indicated, have been ordered: N/A  Dose Assessment: . Are the drug doses appropriate? Yes . Are the following correct: o Drug concentrations Yes o IV fluid compatible with drug Yes o Administration routes Yes o Timing of therapy Yes . If applicable, does the patient have documented access for treatment and/or plans for port-a-cath placement? yes . If applicable, have lifetime cumulative doses been properly documented and assessed? no Lifetime Dose Tracking  No doses have been documented on this patient for the following tracked chemicals: Doxorubicin, Epirubicin, Idarubicin, Daunorubicin, Mitoxantrone, Bleomycin, Oxaliplatin, Carboplatin, Liposomal Doxorubicin  o   Toxicity Monitoring/Prevention: . The patient has the following take home antiemetics prescribed: Ondansetron, prochlorperazine, lorazepam . The patient has the following take home medications prescribed: N/A . Medication allergies and previous infusion related reactions, if applicable, have been reviewed and addressed. Yes . The patient's current medication list has been assessed for drug-drug interactions with their  chemotherapy regimen. no significant drug-drug interactions were identified on review.  Order Review: . Are the treatment plan orders signed? Yes . Is the patient scheduled to see a provider prior to their treatment? Yes  I verify that I have reviewed each item in the above checklist and answered each question accordingly.  Angela Wilson, Jacqlyn Larsen 02/19/2020 2:47 PM

## 2020-02-19 NOTE — Progress Notes (Signed)
Per Dr Antonieta Pert request, [facility] testing sent on:  Specimen [id number] DOS 02/14/2020  Dr Marin Olp would like patient to start treatment ASAP. Spoke to insurance specialist and she states authorization will take approximately 5 days. Patient scheduled for treatment start 02/25/2020.  Called and spoke to Rehab and notified her of plan for treatment. Appointment dates and time given. Helped answer questions about expectations.

## 2020-02-19 NOTE — Progress Notes (Signed)
START ON PATHWAY REGIMEN - Pancreatic Adenocarcinoma     A cycle is every 28 days:     Nab-paclitaxel (protein bound)      Gemcitabine   **Always confirm dose/schedule in your pharmacy ordering system**  Patient Characteristics: Metastatic Disease, First Line, PS = 0,1, BRCA1/2 and PALB2  Mutation Absent/Unknown Therapeutic Status: Metastatic Disease Line of Therapy: First Line ECOG Performance Status: 1 BRCA1/2 Mutation Status: Awaiting Test Results PALB2 Mutation Status: Awaiting Test Results Intent of Therapy: Non-Curative / Palliative Intent, Discussed with Patient 

## 2020-02-20 ENCOUNTER — Encounter: Payer: Self-pay | Admitting: *Deleted

## 2020-02-20 NOTE — Progress Notes (Signed)
Received faxed notification of approval of oxycodone and fentanyl.   Called and left message on daughter's voice mail regarding approval.  [place] to notify them of approval and they confirmed by running prescription through system.

## 2020-02-20 NOTE — Progress Notes (Signed)
See MyChart messaging  Oxycodone and Fentanyl require prior authorization. [place] and initiated PA on both medications. Office notes faxed per their request. Received forms which were completed and faxed back to [facility].  Received fax at 1440 stating they needed office notes. Sent a second time.   Sent update through MyChart that we are still awaiting prior auth. Will follow up tomorrow.

## 2020-02-21 LAB — CA 125: Cancer Antigen (CA) 125: 824 U/mL — ABNORMAL HIGH (ref 0.0–38.1)

## 2020-02-21 LAB — CEA: CEA: 32.3 ng/mL — ABNORMAL HIGH (ref 0.0–4.7)

## 2020-02-24 ENCOUNTER — Other Ambulatory Visit: Payer: Self-pay | Admitting: *Deleted

## 2020-02-24 DIAGNOSIS — K8689 Other specified diseases of pancreas: Secondary | ICD-10-CM

## 2020-02-24 DIAGNOSIS — C252 Malignant neoplasm of tail of pancreas: Secondary | ICD-10-CM

## 2020-02-25 ENCOUNTER — Other Ambulatory Visit: Payer: Self-pay | Admitting: *Deleted

## 2020-02-25 ENCOUNTER — Inpatient Hospital Stay: Payer: 59

## 2020-02-25 ENCOUNTER — Encounter: Payer: Self-pay | Admitting: Hematology & Oncology

## 2020-02-25 ENCOUNTER — Inpatient Hospital Stay: Payer: 59 | Attending: Hematology & Oncology

## 2020-02-25 ENCOUNTER — Other Ambulatory Visit: Payer: Self-pay

## 2020-02-25 ENCOUNTER — Telehealth: Payer: Self-pay | Admitting: Hematology & Oncology

## 2020-02-25 ENCOUNTER — Inpatient Hospital Stay (HOSPITAL_BASED_OUTPATIENT_CLINIC_OR_DEPARTMENT_OTHER): Payer: 59 | Admitting: Hematology & Oncology

## 2020-02-25 ENCOUNTER — Ambulatory Visit: Payer: 59 | Admitting: Nutrition

## 2020-02-25 VITALS — BP 115/71 | HR 93 | Temp 97.5°F | Resp 18

## 2020-02-25 VITALS — BP 136/69 | HR 113 | Temp 97.1°F | Resp 18 | Ht 64.0 in | Wt 226.1 lb

## 2020-02-25 DIAGNOSIS — Z95828 Presence of other vascular implants and grafts: Secondary | ICD-10-CM

## 2020-02-25 DIAGNOSIS — C786 Secondary malignant neoplasm of retroperitoneum and peritoneum: Secondary | ICD-10-CM | POA: Insufficient documentation

## 2020-02-25 DIAGNOSIS — C252 Malignant neoplasm of tail of pancreas: Secondary | ICD-10-CM

## 2020-02-25 DIAGNOSIS — R971 Elevated cancer antigen 125 [CA 125]: Secondary | ICD-10-CM | POA: Insufficient documentation

## 2020-02-25 DIAGNOSIS — R970 Elevated carcinoembryonic antigen [CEA]: Secondary | ICD-10-CM | POA: Diagnosis not present

## 2020-02-25 DIAGNOSIS — Z5111 Encounter for antineoplastic chemotherapy: Secondary | ICD-10-CM | POA: Diagnosis not present

## 2020-02-25 DIAGNOSIS — R739 Hyperglycemia, unspecified: Secondary | ICD-10-CM | POA: Insufficient documentation

## 2020-02-25 DIAGNOSIS — G893 Neoplasm related pain (acute) (chronic): Secondary | ICD-10-CM | POA: Insufficient documentation

## 2020-02-25 DIAGNOSIS — J910 Malignant pleural effusion: Secondary | ICD-10-CM | POA: Diagnosis not present

## 2020-02-25 DIAGNOSIS — K8689 Other specified diseases of pancreas: Secondary | ICD-10-CM

## 2020-02-25 LAB — CMP (CANCER CENTER ONLY)
ALT: 8 U/L (ref 0–44)
AST: 9 U/L — ABNORMAL LOW (ref 15–41)
Albumin: 3.8 g/dL (ref 3.5–5.0)
Alkaline Phosphatase: 43 U/L (ref 38–126)
Anion gap: 7 (ref 5–15)
BUN: 7 mg/dL (ref 6–20)
CO2: 27 mmol/L (ref 22–32)
Calcium: 9.4 mg/dL (ref 8.9–10.3)
Chloride: 95 mmol/L — ABNORMAL LOW (ref 98–111)
Creatinine: 0.56 mg/dL (ref 0.44–1.00)
GFR, Est AFR Am: >60 mL/min (ref >60)
GFR, Estimated: >60 mL/min (ref >60)
Glucose, Bld: 282 mg/dL — ABNORMAL HIGH (ref 70–99)
Potassium: 4.3 mmol/L (ref 3.5–5.1)
Sodium: 129 mmol/L — ABNORMAL LOW (ref 135–145)
Total Bilirubin: 0.5 mg/dL (ref 0.3–1.2)
Total Protein: 7.6 g/dL (ref 6.5–8.1)

## 2020-02-25 LAB — CBC WITH DIFFERENTIAL (CANCER CENTER ONLY)
Abs Immature Granulocytes: 0.05 10*3/uL (ref 0.00–0.07)
Basophils Absolute: 0 10*3/uL (ref 0.0–0.1)
Basophils Relative: 0 %
Eosinophils Absolute: 0.4 10*3/uL (ref 0.0–0.5)
Eosinophils Relative: 5 %
HCT: 39 % (ref 36.0–46.0)
Hemoglobin: 12.2 g/dL (ref 12.0–15.0)
Immature Granulocytes: 1 %
Lymphocytes Relative: 14 %
Lymphs Abs: 1.1 10*3/uL (ref 0.7–4.0)
MCH: 25.4 pg — ABNORMAL LOW (ref 26.0–34.0)
MCHC: 31.3 g/dL (ref 30.0–36.0)
MCV: 81.3 fL (ref 80.0–100.0)
Monocytes Absolute: 0.4 10*3/uL (ref 0.1–1.0)
Monocytes Relative: 6 %
Neutro Abs: 5.8 10*3/uL (ref 1.7–7.7)
Neutrophils Relative %: 74 %
Platelet Count: 216 10*3/uL (ref 150–400)
RBC: 4.8 MIL/uL (ref 3.87–5.11)
RDW: 14.1 % (ref 11.5–15.5)
WBC Count: 7.8 10*3/uL (ref 4.0–10.5)
nRBC: 0 % (ref 0.0–0.2)

## 2020-02-25 MED ORDER — PROCHLORPERAZINE MALEATE 10 MG PO TABS
ORAL_TABLET | ORAL | Status: AC
  Filled 2020-02-25: qty 1

## 2020-02-25 MED ORDER — LIDOCAINE-PRILOCAINE 2.5-2.5 % EX CREA
1.0000 | TOPICAL_CREAM | Freq: Once | CUTANEOUS | 3 refills | Status: DC
Start: 2020-02-25 — End: 2020-02-25

## 2020-02-25 MED ORDER — STERILE WATER FOR INJECTION IJ SOLN
INTRAMUSCULAR | Status: AC
  Filled 2020-02-25: qty 10

## 2020-02-25 MED ORDER — HYDROMORPHONE HCL 1 MG/ML IJ SOLN
INTRAMUSCULAR | Status: AC
  Filled 2020-02-25: qty 2

## 2020-02-25 MED ORDER — INSULIN REGULAR HUMAN 100 UNIT/ML IJ SOLN: 10.0000 [IU] | Freq: Once | INTRAMUSCULAR | Status: DC

## 2020-02-25 MED ORDER — ALTEPLASE 2 MG IJ SOLR
2.0000 mg | Freq: Once | INTRAMUSCULAR | Status: AC | PRN
  Administered 2020-02-25: 2 mg
  Filled 2020-02-25: qty 2

## 2020-02-25 MED ORDER — LIDOCAINE-PRILOCAINE 2.5-2.5 % EX CREA: 1.0000 "application " | TOPICAL_CREAM | Freq: Once | CUTANEOUS | 3 refills | Status: AC

## 2020-02-25 MED ORDER — SODIUM CHLORIDE 0.9 % IV SOLN
2000.0000 mg | Freq: Once | INTRAVENOUS | Status: AC
  Administered 2020-02-25: 2000 mg via INTRAVENOUS
  Filled 2020-02-25: qty 52.6

## 2020-02-25 MED ORDER — SODIUM CHLORIDE 0.9% FLUSH
10.0000 mL | INTRAVENOUS | Status: DC | PRN
  Administered 2020-02-25: 10 mL
  Filled 2020-02-25: qty 10

## 2020-02-25 MED ORDER — PACLITAXEL PROTEIN-BOUND CHEMO INJECTION 100 MG
100.0000 mg/m2 | Freq: Once | INTRAVENOUS | Status: AC
  Administered 2020-02-25: 200 mg via INTRAVENOUS
  Filled 2020-02-25: qty 40

## 2020-02-25 MED ORDER — INSULIN REGULAR HUMAN 100 UNIT/ML IJ SOLN
10.0000 [IU] | Freq: Once | INTRAMUSCULAR | Status: AC
  Administered 2020-02-25: 10 [IU] via SUBCUTANEOUS

## 2020-02-25 MED ORDER — OXYCODONE HCL 10 MG PO TABS: 10.0000 mg | ORAL_TABLET | Freq: Four times a day (QID) | ORAL | 0 refills | Status: DC | PRN

## 2020-02-25 MED ORDER — HYDROMORPHONE HCL 1 MG/ML IJ SOLN
2.0000 mg | INTRAMUSCULAR | Status: DC | PRN
  Administered 2020-02-25: 2 mg via INTRAVENOUS

## 2020-02-25 MED ORDER — SODIUM CHLORIDE 0.9 % IV SOLN
Freq: Once | INTRAVENOUS | Status: DC
  Filled 2020-02-25: qty 250

## 2020-02-25 MED ORDER — FENTANYL 50 MCG/HR TD PT72: 1.0000 | MEDICATED_PATCH | TRANSDERMAL | 0 refills | Status: DC

## 2020-02-25 MED ORDER — HYDROMORPHONE HCL 1 MG/ML IJ SOLN: 2.0000 mg | INTRAMUSCULAR | Status: DC | PRN

## 2020-02-25 MED ORDER — ALTEPLASE 2 MG IJ SOLR
INTRAMUSCULAR | Status: AC
  Filled 2020-02-25: qty 2

## 2020-02-25 MED ORDER — HEPARIN SOD (PORK) LOCK FLUSH 100 UNIT/ML IV SOLN
500.0000 [IU] | Freq: Once | INTRAVENOUS | Status: AC | PRN
  Administered 2020-02-25: 500 [IU]
  Filled 2020-02-25: qty 5

## 2020-02-25 MED ORDER — PROCHLORPERAZINE MALEATE 10 MG PO TABS
10.0000 mg | ORAL_TABLET | Freq: Once | ORAL | Status: AC
  Administered 2020-02-25: 10 mg via ORAL

## 2020-02-25 NOTE — Telephone Encounter (Signed)
Appointments scheduled calendar printed per 6/8 los 

## 2020-02-25 NOTE — Progress Notes (Signed)
Sat with pt and daughter ( interpretor) and discussed treatment ,side effects and answered all questions and concerns from pt and daughter. Information fact sheets given to daughter about Gemzar and Abraxane, consent signed. Pt and daughter denied any concerns at this time.

## 2020-02-25 NOTE — Progress Notes (Signed)
Cath flow placed in port at 0915. Good blood returned and labs drawn at 10:00.

## 2020-02-25 NOTE — Progress Notes (Signed)
Okay to treat with HR 113 per Dr. Marin Olp.

## 2020-02-25 NOTE — Progress Notes (Signed)
Hematology and Oncology Follow Up Visit  Angela Wilson [id number] 12/07/1965 53 y.o. 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  She had to be admitted to [facility] about a week or so ago.  She is having abdominal pain.  She clearly has an aggressive tumor.  She was found to have marked progression of her malignancy.  She was found to have a right pleural effusion.  She had a peritoneal mass that was biopsied.  This was done on 02/11/2020.  The pathology report ([id number]) showed adenocarcinoma.  This was consistent with a pancreatic malignancy.  Shockingly, her CA 19-9 has was been normal.  We did get some other tumor markers.  The CA-125 is quite elevated at 824.  Her CEA is 32.  The right pleural effusion was also drained.  This also contain malignant cells.  We are awaiting the molecular markers.  We we will start treatment on her.  I think that Gemzar/Abraxane would be appropriate for her.  She has a Port-A-Cath in already.  She still having problems with abdominal pain.  It took [place] a while to get the pain medication for her.  There was insurance issues.  She is on a Duragesic patch at 25 mcg.  We will increase this to 50 mcg.  She is also taking oxycodone every 4 hours.  Her blood sugars are on the high side today.  We will give her some insulin while in the clinic.  I will also give her some intravenous pain medication with Dilaudid.  She is not having constipation.  There is no diarrhea.  She is having no cough.  There is no shortness of breath.  Thankfully, her daughter is doing a great job with translating.  Overall, I would say her performance status is ECOG 1.  Medications:  Current Outpatient  Medications:  .  albuterol (PROVENTIL HFA;VENTOLIN HFA) 108 (90 Base) MCG/ACT inhaler, Inhale 2 puffs into the lungs every 4 (four) hours as needed for wheezing or shortness of breath., Disp: 1 Inhaler, Rfl: 0 .  amLODipine (NORVASC) 10 MG tablet, Take 10 mg by mouth daily., Disp: , Rfl:  .  amLODipine (NORVASC) 5 MG tablet, Take 1 tablet (5 mg total) by mouth daily., Disp: 30 tablet, Rfl: 2 .  aspirin 81 MG chewable tablet, Chew 81 mg by mouth daily., Disp: , Rfl:  .  cetirizine (ZYRTEC) 10 MG tablet, Take 10 mg by mouth daily., Disp: , Rfl:  .  fentaNYL (DURAGESIC) 25 MCG/HR, Place 1 patch onto the skin every 3 (three) days., Disp: 5 patch, Rfl: 0 .  glipiZIDE (GLUCOTROL) 10 MG tablet, Take 10 mg by mouth 2 (two) times daily., Disp: , Rfl:  .  lisinopril (ZESTRIL) 10 MG tablet, Take 10 mg by mouth daily., Disp: , Rfl:  .  metFORMIN (GLUCOPHAGE XR) 500 MG 24 hr tablet, Take 1 tablet (500 mg total) by mouth daily with breakfast., Disp: 30 tablet, Rfl: 1 .  omeprazole (PRILOSEC) 40 MG capsule, Take 40 mg by mouth daily., Disp: , Rfl:  .  oxyCODONE (ROXICODONE) 5 MG immediate release tablet, Take 1-2 tablets (5-10 mg total) by mouth every 4 (four) hours as needed for severe pain., Disp: 30 tablet, Rfl: 0 .  ergocalciferol (VITAMIN  D2) 1.25 MG (50000 UT) capsule, Take 50,000 Units by mouth once a week. , Disp: , Rfl:  .  metoCLOPramide (REGLAN) 5 MG tablet, Take 1 tablet (5 mg total) by mouth 4 (four) times daily -  before meals and at bedtime., Disp: 45 tablet, Rfl: 3 .  ondansetron (ZOFRAN) 4 MG tablet, Take 1 tablet (4 mg total) by mouth every 6 (six) hours as needed for nausea. (Patient not taking: Reported on 02/25/2020), Disp: 20 tablet, Rfl: 0 .  polyethylene glycol (MIRALAX / GLYCOLAX) 17 g packet, Take 17 g by mouth daily as needed. (Patient not taking: Reported on 02/25/2020), Disp: 14 each, Rfl: 0 .  pravastatin (PRAVACHOL) 40 MG tablet, Take 40 mg by mouth daily., Disp: , Rfl:  .   senna-docusate (SENOKOT-S) 8.6-50 MG tablet, Take 1 tablet by mouth 2 (two) times daily. (Patient not taking: Reported on 02/25/2020), Disp: 60 tablet, Rfl: 0  Current Facility-Administered Medications:  .  HYDROmorphone (DILAUDID) injection 2 mg, 2 mg, Intravenous, Q2H PRN, Novali Vollman R, MD .  insulin regular (NOVOLIN R) 100 units/mL injection 10 Units, 10 Units, Subcutaneous, Once, Edilberto Roosevelt, Rudell Cobb, MD  Allergies:  Allergies  Allergen Reactions  . Iodinated Diagnostic Agents Itching and Rash  . Ivp Dye [Iodinated Diagnostic Agents] Itching and Rash  . Morphine And Related Hives  . Penicillins Rash    Past Medical History, Surgical history, Social history, and Family History were reviewed and updated.  Review of Systems: Review of Systems  Constitutional: Negative.   HENT:  Negative.   Eyes: Negative.   Respiratory: Negative.   Cardiovascular: Negative.   Gastrointestinal: Positive for abdominal pain and nausea.  Endocrine: Negative.   Genitourinary: Negative.    Musculoskeletal: Negative.   Skin: Negative.   Neurological: Negative.   Hematological: Negative.   Psychiatric/Behavioral: Negative.     Physical Exam:  height is 5\' 4"  (4.270 m) and weight is 226 lb 1.3 oz (102.5 kg). Her temporal temperature is 97.1 F (36.2 C) (abnormal). Her blood pressure is 136/69 and her pulse is 113 (abnormal). Her respiration is 18 and oxygen saturation is 96%.   Wt Readings from Last 3 Encounters:  02/25/20 226 lb 1.3 oz (102.5 kg)  02/17/20 180 lb 3.2 oz (81.7 kg)  02/03/20 226 lb 4 oz (102.6 kg)    Physical Exam Vitals reviewed.  HENT:     Head: Normocephalic and atraumatic.  Eyes:     Pupils: Pupils are equal, round, and reactive to light.  Cardiovascular:     Rate and Rhythm: Normal rate and regular rhythm.     Heart sounds: Normal heart sounds.  Pulmonary:     Effort: Pulmonary effort is normal.     Breath sounds: Normal breath sounds.  Abdominal:     General: Bowel  sounds are normal.     Palpations: Abdomen is soft.  Musculoskeletal:        General: No tenderness or deformity. Normal range of motion.     Cervical back: Normal range of motion.  Lymphadenopathy:     Cervical: No cervical adenopathy.  Skin:    General: Skin is warm and dry.     Findings: No erythema or rash.  Neurological:     Mental Status: She is alert and oriented to person, place, and time.  Psychiatric:        Behavior: Behavior normal.        Thought Content: Thought content normal.        Judgment:  Judgment normal.      Lab Results  Component Value Date   WBC 7.8 02/25/2020   HGB 12.2 02/25/2020   HCT 39.0 02/25/2020   MCV 81.3 02/25/2020   PLT 216 02/25/2020     Chemistry      Component Value Date/Time   NA 129 (L) 02/25/2020 0855   NA 136 01/07/2020 1256   K 4.3 02/25/2020 0855   CL 95 (L) 02/25/2020 0855   CO2 27 02/25/2020 0855   BUN 7 02/25/2020 0855   BUN 10 01/07/2020 1256   CREATININE 0.56 02/25/2020 0855      Component Value Date/Time   CALCIUM 9.4 02/25/2020 0855   ALKPHOS 43 02/25/2020 0855   AST 9 (L) 02/25/2020 0855   ALT 8 02/25/2020 0855   BILITOT 0.5 02/25/2020 0855       Impression and Plan: Angela Wilson is a very nice 54 year old woman from [place].  She has metastatic pancreatic cancer.  This is clearly aggressive.  She really had no evidence of metastatic disease back in April.  I really thought when I first saw her in May that she will have surgery to resect this.  However, it is clearly progressed outside the confines of surgery.  We will try Gemzar/Abraxane.  Hopefully we will get a response.  If we can get a response, her pain will get better and she will feel better.  It is unusual that she has a normal CA 19-9.  However, we can monitor the CEA and CA-125.  I would like to give her a little bit of insulin today.  We will give her 10 units of insulin.  She will have treatment next week.  She will then have a week off.  I  really hope that she does respond.  She is very very nice.  She is so complementary.  She has great support.  We will need to get her children over from [place].  I believe that the will be essential to help her.  I think a daughter of hers is a doctor over in [place].  We will see how we can help to get them over to the [place] for assistance.   Volanda Napoleon, MD 6/8/202110:24 AM

## 2020-02-25 NOTE — Progress Notes (Signed)
Pharmacist Chemotherapy Monitoring - Follow Up Assessment    I verify that I have reviewed each item in the below checklist:  . Regimen for the patient is scheduled for the appropriate day and plan matches scheduled date. Marland Kitchen Appropriate non-routine labs are ordered dependent on drug ordered. . If applicable, additional medications reviewed and ordered per protocol based on lifetime cumulative doses and/or treatment regimen.   Plan for follow-up and/or issues identified: No . I-vent associated with next due treatment: No . MD and/or nursing notified: No  Angee Gupton, Jacqlyn Larsen 02/25/2020 8:33 AM

## 2020-02-25 NOTE — Progress Notes (Signed)
54 year old female diagnosed with Pancreas cancer. She is a patient of Dr. Marin Olp.  She is receiving Gemzar/Abraxane -cycle #1 to start on 02/25/2020  PMH includes DM, HTN, Shingles, HLD, GERD.  Medications include Vitamin D, Glucotrol, Glucophage, Reglan, Prilosec, Zofran, Miralax, Senekot S  Labs include Na 129, Glucose 282.  Height: 5'2". Weight: 226.08 lb. BMI: 38.81.  Spoke with patient and daughter. Patient lives with her daughter who cooks and cares for her. Patient is described as "picky" when it comes to food choices. She loves fresh fruit, white cheese, fish, chicken, red meat and greek yogurt. She will eat oatmeal. She is very concerned about having nausea. Takes pain medicine but denies constipation currently. Per daughter, patient has not been taking prescribed DM medication.  Nutrition Diagnosis: Food and Nutrition Related Knowledge Deficit related to cancer and associated treatments as evidenced by no prior need for nutrition education.  Intervention: Educated on importance of increasing protein and consuming adequate calories in small frequent meals and snacks. Encouraged nausea medication as needed and educated on strategies for eating with nausea. Brief education provided on strategies for adequate glycemic control. Encouraged medication compliance. Questions answered, teach back method used. Provided fact sheets and contact information.  Monitoring, Evaluation, Goals: Patient will tolerate adequate calories and protein for weight maintenance or minimal wt loss.  No follow up scheduled. Patient has my contact information.

## 2020-02-25 NOTE — Addendum Note (Signed)
Addended by: Melton Krebs on: 02/25/2020 10:06 AM   Modules accepted: Orders, SmartSet

## 2020-02-26 ENCOUNTER — Other Ambulatory Visit: Payer: Self-pay | Admitting: *Deleted

## 2020-02-26 MED ORDER — ONDANSETRON HCL 4 MG PO TABS: 4.0000 mg | ORAL_TABLET | Freq: Four times a day (QID) | ORAL | 0 refills | Status: DC | PRN

## 2020-02-28 ENCOUNTER — Other Ambulatory Visit: Payer: Self-pay | Admitting: *Deleted

## 2020-02-28 ENCOUNTER — Encounter: Payer: Self-pay | Admitting: *Deleted

## 2020-02-28 ENCOUNTER — Telehealth: Payer: Self-pay | Admitting: *Deleted

## 2020-02-28 DIAGNOSIS — K8689 Other specified diseases of pancreas: Secondary | ICD-10-CM

## 2020-02-28 DIAGNOSIS — C252 Malignant neoplasm of tail of pancreas: Secondary | ICD-10-CM

## 2020-02-28 MED ORDER — PROCHLORPERAZINE MALEATE 10 MG PO TABS: 10.0000 mg | ORAL_TABLET | Freq: Four times a day (QID) | ORAL | 0 refills | Status: DC | PRN

## 2020-02-28 NOTE — Telephone Encounter (Signed)
Daughter called and stated,"mom has nausea that started Thursday night, and Friday morning she is vomiting. She has been taking Zofran but it's not working. Is there another medication she can try before the weekend? Per Dr. Marin Olp, let's try Compazine. Prescription sent to her pharmacy. Also, the letter for her is at the front desk and ready for pick up. She verbalized understanding.

## 2020-03-02 ENCOUNTER — Encounter: Payer: Self-pay | Admitting: *Deleted

## 2020-03-02 ENCOUNTER — Ambulatory Visit: Payer: Self-pay | Admitting: Family Medicine

## 2020-03-02 NOTE — Progress Notes (Signed)
Nutrition & Education appointments completed. Began treatment 02/25/2020. Will follow up tomorrow regarding genetics.  Oncology Nurse Navigator Documentation  Oncology Nurse Navigator Flowsheets 03/02/2020  Confirmed Diagnosis Date -  Diagnosis Status -  Planned Course of Treatment Chemotherapy  Phase of Treatment Chemo  Chemotherapy Pending- Reason: -  Chemotherapy Actual Start Date: 02/25/2020  Navigator Follow Up Date: 03/03/2020  Navigator Follow Up Reason: Follow-up Appointment;Chemotherapy  Navigator Location [facility]  Referral Date to RadOnc/MedOnc -  Navigator Encounter Type Appt/Treatment Plan Review  Telephone -  Treatment Initiated Date 02/25/2020  Patient Visit Type -  Treatment Phase Active Tx  Barriers/Navigation Needs No Needs  Education -  Interventions -  Acuity -  Referrals -  Coordination of Care -  Education Method -  Support Groups/Services -  Time Spent with Patient 30

## 2020-03-03 ENCOUNTER — Inpatient Hospital Stay: Payer: 59

## 2020-03-03 ENCOUNTER — Other Ambulatory Visit: Payer: Self-pay

## 2020-03-03 ENCOUNTER — Inpatient Hospital Stay (HOSPITAL_BASED_OUTPATIENT_CLINIC_OR_DEPARTMENT_OTHER): Payer: 59 | Admitting: Hematology & Oncology

## 2020-03-03 ENCOUNTER — Encounter: Payer: Self-pay | Admitting: *Deleted

## 2020-03-03 ENCOUNTER — Ambulatory Visit (HOSPITAL_BASED_OUTPATIENT_CLINIC_OR_DEPARTMENT_OTHER)
Admission: RE | Admit: 2020-03-03 | Discharge: 2020-03-03 | Disposition: A | Discharge status: Home / Self Care | Payer: 59 | Source: Ambulatory Visit | Attending: Hematology & Oncology | Admitting: Hematology & Oncology

## 2020-03-03 DIAGNOSIS — Z5111 Encounter for antineoplastic chemotherapy: Secondary | ICD-10-CM | POA: Diagnosis not present

## 2020-03-03 DIAGNOSIS — C252 Malignant neoplasm of tail of pancreas: Secondary | ICD-10-CM

## 2020-03-03 DIAGNOSIS — R0602 Shortness of breath: Secondary | ICD-10-CM

## 2020-03-03 LAB — CBC WITH DIFFERENTIAL (CANCER CENTER ONLY)
Abs Immature Granulocytes: 0.3 10*3/uL — ABNORMAL HIGH (ref 0.00–0.07)
Basophils Absolute: 0 10*3/uL (ref 0.0–0.1)
Basophils Relative: 0 %
Eosinophils Absolute: 0.4 10*3/uL (ref 0.0–0.5)
Eosinophils Relative: 5 %
HCT: 37.3 % (ref 36.0–46.0)
Hemoglobin: 11.8 g/dL — ABNORMAL LOW (ref 12.0–15.0)
Immature Granulocytes: 4 %
Lymphocytes Relative: 18 %
Lymphs Abs: 1.3 10*3/uL (ref 0.7–4.0)
MCH: 25.5 pg — ABNORMAL LOW (ref 26.0–34.0)
MCHC: 31.6 g/dL (ref 30.0–36.0)
MCV: 80.6 fL (ref 80.0–100.0)
Monocytes Absolute: 0.5 10*3/uL (ref 0.1–1.0)
Monocytes Relative: 7 %
Neutro Abs: 4.7 10*3/uL (ref 1.7–7.7)
Neutrophils Relative %: 66 %
Platelet Count: 126 10*3/uL — ABNORMAL LOW (ref 150–400)
RBC: 4.63 MIL/uL (ref 3.87–5.11)
RDW: 14.2 % (ref 11.5–15.5)
WBC Count: 7.2 10*3/uL (ref 4.0–10.5)
nRBC: 0.8 % — ABNORMAL HIGH (ref 0.0–0.2)

## 2020-03-03 LAB — CMP (CANCER CENTER ONLY)
ALT: 13 U/L (ref 0–44)
AST: 15 U/L (ref 15–41)
Albumin: 3.4 g/dL — ABNORMAL LOW (ref 3.5–5.0)
Alkaline Phosphatase: 46 U/L (ref 38–126)
Anion gap: 9 (ref 5–15)
BUN: 6 mg/dL (ref 6–20)
CO2: 26 mmol/L (ref 22–32)
Calcium: 9.2 mg/dL (ref 8.9–10.3)
Chloride: 95 mmol/L — ABNORMAL LOW (ref 98–111)
Creatinine: 0.5 mg/dL (ref 0.44–1.00)
GFR, Est AFR Am: >60 mL/min (ref >60)
GFR, Estimated: >60 mL/min (ref >60)
Glucose, Bld: 216 mg/dL — ABNORMAL HIGH (ref 70–99)
Potassium: 4.3 mmol/L (ref 3.5–5.1)
Sodium: 130 mmol/L — ABNORMAL LOW (ref 135–145)
Total Bilirubin: 0.4 mg/dL (ref 0.3–1.2)
Total Protein: 6.9 g/dL (ref 6.5–8.1)

## 2020-03-03 MED ORDER — PACLITAXEL PROTEIN-BOUND CHEMO INJECTION 100 MG
100.0000 mg/m2 | Freq: Once | INTRAVENOUS | Status: AC
  Administered 2020-03-03: 200 mg via INTRAVENOUS
  Filled 2020-03-03: qty 40

## 2020-03-03 MED ORDER — PROCHLORPERAZINE MALEATE 10 MG PO TABS
ORAL_TABLET | ORAL | Status: AC
  Filled 2020-03-03: qty 1

## 2020-03-03 MED ORDER — SODIUM CHLORIDE 0.9 % IV SOLN
2000.0000 mg | Freq: Once | INTRAVENOUS | Status: AC
  Administered 2020-03-03: 2000 mg via INTRAVENOUS
  Filled 2020-03-03: qty 52.6

## 2020-03-03 MED ORDER — SODIUM CHLORIDE 0.9 % IV SOLN
Freq: Once | INTRAVENOUS | Status: AC
  Filled 2020-03-03: qty 250

## 2020-03-03 MED ORDER — HEPARIN SOD (PORK) LOCK FLUSH 100 UNIT/ML IV SOLN
500.0000 [IU] | Freq: Once | INTRAVENOUS | Status: AC | PRN
  Administered 2020-03-03: 500 [IU]
  Filled 2020-03-03: qty 5

## 2020-03-03 MED ORDER — HYDROMORPHONE HCL 1 MG/ML IJ SOLN
2.0000 mg | Freq: Once | INTRAMUSCULAR | Status: AC
  Administered 2020-03-03: 2 mg via INTRAVENOUS

## 2020-03-03 MED ORDER — SODIUM CHLORIDE 0.9% FLUSH
10.0000 mL | INTRAVENOUS | Status: DC | PRN
  Administered 2020-03-03: 10 mL
  Filled 2020-03-03: qty 10

## 2020-03-03 MED ORDER — PROCHLORPERAZINE MALEATE 10 MG PO TABS
10.0000 mg | ORAL_TABLET | Freq: Once | ORAL | Status: AC
  Administered 2020-03-03: 10 mg via ORAL

## 2020-03-03 MED ORDER — HYDROMORPHONE HCL 1 MG/ML IJ SOLN
INTRAMUSCULAR | Status: AC
  Filled 2020-03-03: qty 2

## 2020-03-03 NOTE — Patient Instructions (Signed)
Implanted Port Insertion, Care After °This sheet gives you information about how to care for yourself after your procedure. Your health care provider may also give you more specific instructions. If you have problems or questions, contact your health care provider. °What can I expect after the procedure? °After the procedure, it is common to have: °· Discomfort at the port insertion site. °· Bruising on the skin over the port. This should improve over 3-4 days. °Follow these instructions at home: °Port care °· After your port is placed, you will get a manufacturer's information card. The card has information about your port. Keep this card with you at all times. °· Take care of the port as told by your health care provider. Ask your health care provider if you or a family member can get training for taking care of the port at home. A home health care nurse may also take care of the port. °· Make sure to remember what type of port you have. °Incision care ° °  ° °· Follow instructions from your health care provider about how to take care of your port insertion site. Make sure you: °? Wash your hands with soap and water before and after you change your bandage (dressing). If soap and water are not available, use hand sanitizer. °? Change your dressing as told by your health care provider. °? Leave stitches (sutures), skin glue, or adhesive strips in place. These skin closures may need to stay in place for 2 weeks or longer. If adhesive strip edges start to loosen and curl up, you may trim the loose edges. Do not remove adhesive strips completely unless your health care provider tells you to do that. °· Check your port insertion site every day for signs of infection. Check for: °? Redness, swelling, or pain. °? Fluid or blood. °? Warmth. °? Pus or a bad smell. °Activity °· Return to your normal activities as told by your health care provider. Ask your health care provider what activities are safe for you. °· Do not  lift anything that is heavier than 10 lb (4.5 kg), or the limit that you are told, until your health care provider says that it is safe. °General instructions °· Take over-the-counter and prescription medicines only as told by your health care provider. °· Do not take baths, swim, or use a hot tub until your health care provider approves. Ask your health care provider if you may take showers. You may only be allowed to take sponge baths. °· Do not drive for 24 hours if you were given a sedative during your procedure. °· Wear a medical alert bracelet in case of an emergency. This will tell any health care providers that you have a port. °· Keep all follow-up visits as told by your health care provider. This is important. °Contact a health care provider if: °· You cannot flush your port with saline as directed, or you cannot draw blood from the port. °· You have a fever or chills. °· You have redness, swelling, or pain around your port insertion site. °· You have fluid or blood coming from your port insertion site. °· Your port insertion site feels warm to the touch. °· You have pus or a bad smell coming from the port insertion site. °Get help right away if: °· You have chest pain or shortness of breath. °· You have bleeding from your port that you cannot control. °Summary °· Take care of the port as told by your health   care provider. Keep the manufacturer's information card with you at all times. °· Change your dressing as told by your health care provider. °· Contact a health care provider if you have a fever or chills or if you have redness, swelling, or pain around your port insertion site. °· Keep all follow-up visits as told by your health care provider. °This information is not intended to replace advice given to you by your health care provider. Make sure you discuss any questions you have with your health care provider. °Document Revised: 04/03/2018 Document Reviewed: 04/03/2018 °Elsevier Patient Education ©  2020 [employer] ° °

## 2020-03-03 NOTE — Progress Notes (Signed)
Patient here for second treatment. With her diagnosis she qualifies for genetic testing. Spoke to patient and her daughter, Rehab, about patient qualifying for genetic testing and what potential results might mean for her children. Patient and daughter agreeable to proceed with testing. Offered testing through her insurance, or through a sponsored program which will run the testing free of charge in exchange for de-identified results to be shared. They chose to proceed with the sponsored testing.   Blood drawn, message sent to Roma Kayser to place orders and for follow up. Will send sample via [employer] today.   Also followed up with patient and daughter regarding the MyChart request to email the [place] [employer] in  [place] in an effort to have an urgent medical VISA given to the patient's son and daughter who reside there. Gave the daughter and patient the automated emailed responses which were received after sending the email.  The daughter is now asking for a letter to be printed, which is identical to the email sent, but for a brother who lives in another country. She needs the physical copy of the letter. Letter created, signed by Dr Marin Olp and given to daughter.

## 2020-03-03 NOTE — Progress Notes (Signed)
Hematology and Oncology Follow Up Visit  Angela Wilson [id number] 1966/06/05 54 y.o. 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 is back for follow-up and to have day 8 of the first cycle of chemotherapy.  She actually is doing better than I thought.  She is not having as much abdominal pain.  She does have a little bit of shortness of breath.  We will get a chest x-ray on her to make sure that there is no reaccumulation of the right pleural effusion.  She is not eating as much.  Thankfully, she does have the weight to be able to lose if needed.  There is been no problems with diarrhea.  In fact, she has had little constipation.  She did not have a bowel movement for about 4 days.  Her daughter gave her some MiraLAX and this helped.  There is been no problems with fever.  She has had no bleeding.  There is been no mouth sores.  She has been sleeping quite a bit.  She lays in bed a lot.  I told her that if this continues that she would be at high risk for blood clots.  Currently, I would say her performance status is probably ECOG 1.    Her blood sugars are on the high side today, but they actually are little bit better than what they typically are.  Medications:  Current Outpatient Medications:  .  albuterol (PROVENTIL HFA;VENTOLIN HFA) 108 (90 Base) MCG/ACT inhaler, Inhale 2 puffs into the lungs every 4 (four) hours as needed for wheezing or shortness of breath., Disp: 1 Inhaler, Rfl: 0 .  amLODipine (NORVASC) 10 MG tablet, Take 10 mg by mouth daily., Disp: , Rfl:  .  amLODipine (NORVASC) 5 MG tablet, Take 1 tablet (5 mg total) by mouth daily., Disp: 30 tablet, Rfl: 2 .  aspirin 81 MG chewable tablet, Chew 81 mg by mouth daily., Disp: , Rfl:  .  cetirizine (ZYRTEC) 10 MG tablet,  Take 10 mg by mouth daily., Disp: , Rfl:  .  CREON 36000-114000 units CPEP capsule, , Disp: , Rfl:  .  ergocalciferol (VITAMIN D2) 1.25 MG (50000 UT) capsule, Take 50,000 Units by mouth once a week. , Disp: , Rfl:  .  fentaNYL (DURAGESIC) 50 MCG/HR, Place 1 patch onto the skin every 3 (three) days., Disp: 10 patch, Rfl: 0 .  glipiZIDE (GLUCOTROL) 10 MG tablet, Take 10 mg by mouth 2 (two) times daily., Disp: , Rfl:  .  lidocaine-prilocaine (EMLA) cream, APPLY TOPICALLY ONCE FOR 1 DOSE, Disp: , Rfl:  .  lisinopril (ZESTRIL) 10 MG tablet, Take 10 mg by mouth daily., Disp: , Rfl:  .  metFORMIN (GLUCOPHAGE XR) 500 MG 24 hr tablet, Take 1 tablet (500 mg total) by mouth daily with breakfast., Disp: 30 tablet, Rfl: 1 .  metoCLOPramide (REGLAN) 5 MG tablet, Take 1 tablet (5 mg total) by mouth 4 (four) times daily -  before meals and at bedtime., Disp: 45 tablet, Rfl: 3 .  omeprazole (PRILOSEC) 40 MG capsule, Take 40 mg by mouth daily., Disp: , Rfl:  .  ondansetron (ZOFRAN) 4 MG tablet, Take 1 tablet (4 mg total) by mouth every 6 (six) hours as needed for nausea., Disp: 20 tablet, Rfl: 0 .  oxyCODONE 10 MG TABS, Take  1 tablet (10 mg total) by mouth every 6 (six) hours as needed for severe pain., Disp: 90 tablet, Rfl: 0 .  polyethylene glycol (MIRALAX / GLYCOLAX) 17 g packet, Take 17 g by mouth daily as needed. (Patient not taking: Reported on 02/25/2020), Disp: 14 each, Rfl: 0 .  pravastatin (PRAVACHOL) 40 MG tablet, Take 40 mg by mouth daily., Disp: , Rfl:  .  prochlorperazine (COMPAZINE) 10 MG tablet, Take 1 tablet (10 mg total) by mouth every 6 (six) hours as needed for nausea or vomiting., Disp: 30 tablet, Rfl: 0 .  senna-docusate (SENOKOT-S) 8.6-50 MG tablet, Take 1 tablet by mouth 2 (two) times daily. (Patient not taking: Reported on 02/25/2020), Disp: 60 tablet, Rfl: 0  Allergies:  Allergies  Allergen Reactions  . Iodinated Diagnostic Agents Itching and Rash  . Ivp Dye [Iodinated Diagnostic Agents]  Itching and Rash  . Morphine And Related Hives  . Penicillins Rash    Past Medical History, Surgical history, Social history, and Family History were reviewed and updated.  Review of Systems: Review of Systems  Constitutional: Negative.   HENT:  Negative.   Eyes: Negative.   Respiratory: Negative.   Cardiovascular: Negative.   Gastrointestinal: Positive for abdominal pain and nausea.  Endocrine: Negative.   Genitourinary: Negative.    Musculoskeletal: Negative.   Skin: Negative.   Neurological: Negative.   Hematological: Negative.   Psychiatric/Behavioral: Negative.     Physical Exam:  vitals were not taken for this visit.   Wt Readings from Last 3 Encounters:  03/03/20 230 lb 0.6 oz (104.3 kg)  02/25/20 226 lb 1.3 oz (102.5 kg)  02/17/20 180 lb 3.2 oz (81.7 kg)   Her vital signs show a temperature of 97.1.  Pulse is 100.  Blood pressure 116/70.  Weight is 230 pounds.  Physical Exam Vitals reviewed.  HENT:     Head: Normocephalic and atraumatic.  Eyes:     Pupils: Pupils are equal, round, and reactive to light.  Cardiovascular:     Rate and Rhythm: Normal rate and regular rhythm.     Heart sounds: Normal heart sounds.  Pulmonary:     Effort: Pulmonary effort is normal.     Breath sounds: Normal breath sounds.  Abdominal:     General: Bowel sounds are normal.     Palpations: Abdomen is soft.  Musculoskeletal:        General: No tenderness or deformity. Normal range of motion.     Cervical back: Normal range of motion.  Lymphadenopathy:     Cervical: No cervical adenopathy.  Skin:    General: Skin is warm and dry.     Findings: No erythema or rash.  Neurological:     Mental Status: She is alert and oriented to person, place, and time.  Psychiatric:        Behavior: Behavior normal.        Thought Content: Thought content normal.        Judgment: Judgment normal.      Lab Results  Component Value Date   WBC 7.2 03/03/2020   HGB 11.8 (L) 03/03/2020    HCT 37.3 03/03/2020   MCV 80.6 03/03/2020   PLT 126 (L) 03/03/2020     Chemistry      Component Value Date/Time   NA 130 (L) 03/03/2020 1215   NA 136 01/07/2020 1256   K 4.3 03/03/2020 1215   CL 95 (L) 03/03/2020 1215   CO2 26 03/03/2020 1215   BUN 6  03/03/2020 1215   BUN 10 01/07/2020 1256   CREATININE 0.50 03/03/2020 1215      Component Value Date/Time   CALCIUM 9.2 03/03/2020 1215   ALKPHOS 46 03/03/2020 1215   AST 15 03/03/2020 1215   ALT 13 03/03/2020 1215   BILITOT 0.4 03/03/2020 1215       Impression and Plan: [name] is a very nice 54 year old woman from [place].  She has metastatic pancreatic cancer.  This is clearly aggressive.  She really had no evidence of metastatic disease back in April.  I really thought when I first saw her in May that she will have surgery to resect this.  However, it is clearly progressed outside the confines of surgery.  We will go ahead with day 8 of cycle 1 treatment.  Because she is doing well, I will add a day 15 to treatment.  I think we have to have her come back weekly to be seen by [place] prior to treatment.  This is still a little bit of a tenuous situation.  We spent about 30 minutes with her today.  Thankfully, her daughter is a fantastic interpreter and was really able to help [place] out.     Volanda Napoleon, MD 6/15/20211:10 PM

## 2020-03-03 NOTE — Patient Instructions (Addendum)
[facility] Discharge Instructions for Patients Receiving Chemotherapy  Today you received the following chemotherapy agents Abraxane, Gemzar  To help prevent nausea and vomiting after your treatment, we encourage you to take your nausea medication    If you develop nausea and vomiting that is not controlled by your nausea medication, call the clinic.   BELOW ARE SYMPTOMS THAT SHOULD BE REPORTED IMMEDIATELY:  *FEVER GREATER THAN 100.5 F  *CHILLS WITH OR WITHOUT FEVER  NAUSEA AND VOMITING THAT IS NOT CONTROLLED WITH YOUR NAUSEA MEDICATION  *UNUSUAL SHORTNESS OF BREATH  *UNUSUAL BRUISING OR BLEEDING  TENDERNESS IN MOUTH AND THROAT WITH OR WITHOUT PRESENCE OF ULCERS  *URINARY PROBLEMS  *BOWEL PROBLEMS  UNUSUAL RASH Items with * indicate a potential emergency and should be followed up as soon as possible.  Feel free to call the clinic should you have any questions or concerns. The clinic phone number is [phone].  Please show the [place] [facility] at check-in to the Emergency Department and triage nurse.  Nanoparticle Albumin-Bound Paclitaxel injection What is this medicine? NANOPARTICLE ALBUMIN-BOUND PACLITAXEL (Na no PAHR ti kuhl al BYOO muhn-bound PAK li TAX el) is a chemotherapy drug. It targets fast dividing cells, like cancer cells, and causes these cells to die. This medicine is used to treat advanced breast cancer, lung cancer, and pancreatic cancer. This medicine may be used for other purposes; ask your health care provider or pharmacist if you have questions. COMMON BRAND NAME(S): Abraxane What should I tell my health care provider before I take this medicine? They need to know if you have any of these conditions: kidney disease liver disease low blood counts, like low white cell, platelet, or red cell counts lung or breathing disease, like asthma tingling of the fingers or toes, or other nerve disorder an unusual or allergic reaction to  paclitaxel, albumin, other chemotherapy, other medicines, foods, dyes, or preservatives pregnant or trying to get pregnant breast-feeding How should I use this medicine? This drug is given as an infusion into a vein. It is administered in a hospital or clinic by a specially trained health care professional. Talk to your pediatrician regarding the use of this medicine in children. Special care may be needed. Overdosage: If you think you have taken too much of this medicine contact a poison control center or emergency room at once. NOTE: This medicine is only for you. Do not share this medicine with others. What if I miss a dose? It is important not to miss your dose. Call your doctor or health care professional if you are unable to keep an appointment. What may interact with this medicine? This medicine may interact with the following medications: antiviral medicines for hepatitis, HIV or AIDS certain antibiotics like erythromycin and clarithromycin certain medicines for fungal infections like ketoconazole and itraconazole certain medicines for seizures like carbamazepine, phenobarbital, phenytoin gemfibrozil nefazodone rifampin St. John's wort This list may not describe all possible interactions. Give your health care provider a list of all the medicines, herbs, non-prescription drugs, or dietary supplements you use. Also tell them if you smoke, drink alcohol, or use illegal drugs. Some items may interact with your medicine. What should I watch for while using this medicine? Your condition will be monitored carefully while you are receiving this medicine. You will need important blood work done while you are taking this medicine. This medicine can cause serious allergic reactions. If you experience allergic reactions like skin rash, itching or hives, swelling of the face,  lips, or tongue, tell your doctor or health care professional right away. In some cases, you may be given additional  medicines to help with side effects. Follow all directions for their use. This drug may make you feel generally unwell. This is not uncommon, as chemotherapy can affect healthy cells as well as cancer cells. Report any side effects. Continue your course of treatment even though you feel ill unless your doctor tells you to stop. Call your doctor or health care professional for advice if you get a fever, chills or sore throat, or other symptoms of a cold or flu. Do not treat yourself. This drug decreases your body's ability to fight infections. Try to avoid being around people who are sick. This medicine may increase your risk to bruise or bleed. Call your doctor or health care professional if you notice any unusual bleeding. Be careful brushing and flossing your teeth or using a toothpick because you may get an infection or bleed more easily. If you have any dental work done, tell your dentist you are receiving this medicine. Avoid taking products that contain aspirin, acetaminophen, ibuprofen, naproxen, or ketoprofen unless instructed by your doctor. These medicines may hide a fever. Do not become pregnant while taking this medicine or for 6 months after stopping it. Women should inform their doctor if they wish to become pregnant or think they might be pregnant. Men should not father a child while taking this medicine or for 3 months after stopping it. There is a potential for serious side effects to an unborn child. Talk to your health care professional or pharmacist for more information. Do not breast-feed an infant while taking this medicine or for 2 weeks after stopping it. This medicine may interfere with the ability to get pregnant or to father a child. You should talk to your doctor or health care professional if you are concerned about your fertility. What side effects may I notice from receiving this medicine? Side effects that you should report to your doctor or health care professional as soon  as possible: allergic reactions like skin rash, itching or hives, swelling of the face, lips, or tongue breathing problems changes in vision fast, irregular heartbeat low blood pressure mouth sores pain, tingling, numbness in the hands or feet signs of decreased platelets or bleeding - bruising, pinpoint red spots on the skin, black, tarry stools, blood in the urine signs of decreased red blood cells - unusually weak or tired, feeling faint or lightheaded, falls signs of infection - fever or chills, cough, sore throat, pain or difficulty passing urine signs and symptoms of liver injury like dark yellow or brown urine; general ill feeling or flu-like symptoms; light-colored stools; loss of appetite; nausea; right upper belly pain; unusually weak or tired; yellowing of the eyes or skin swelling of the ankles, feet, hands unusually slow heartbeat Side effects that usually do not require medical attention (report to your doctor or health care professional if they continue or are bothersome): diarrhea hair loss loss of appetite nausea, vomiting tiredness This list may not describe all possible side effects. Call your doctor for medical advice about side effects. You may report side effects to FDA at 1-800-FDA-1088. Where should I keep my medicine? This drug is given in a hospital or clinic and will not be stored at home. NOTE: This sheet is a summary. It may not cover all possible information. If you have questions about this medicine, talk to your doctor, pharmacist, or health care provider.  2020 Elsevier/Gold Standard (2017-05-09 13:03:45) Gemcitabine injection What is this medicine? GEMCITABINE (jem SYE ta been) is a chemotherapy drug. This medicine is used to treat many types of cancer like breast cancer, lung cancer, pancreatic cancer, and ovarian cancer. This medicine may be used for other purposes; ask your health care provider or pharmacist if you have questions. COMMON BRAND  NAME(S): Gemzar, Infugem What should I tell my health care provider before I take this medicine? They need to know if you have any of these conditions: blood disorders infection kidney disease liver disease lung or breathing disease, like asthma recent or ongoing radiation therapy an unusual or allergic reaction to gemcitabine, other chemotherapy, other medicines, foods, dyes, or preservatives pregnant or trying to get pregnant breast-feeding How should I use this medicine? This drug is given as an infusion into a vein. It is administered in a hospital or clinic by a specially trained health care professional. Talk to your pediatrician regarding the use of this medicine in children. Special care may be needed. Overdosage: If you think you have taken too much of this medicine contact a poison control center or emergency room at once. NOTE: This medicine is only for you. Do not share this medicine with others. What if I miss a dose? It is important not to miss your dose. Call your doctor or health care professional if you are unable to keep an appointment. What may interact with this medicine? medicines to increase blood counts like filgrastim, pegfilgrastim, sargramostim some other chemotherapy drugs like cisplatin vaccines Talk to your doctor or health care professional before taking any of these medicines: acetaminophen aspirin ibuprofen ketoprofen naproxen This list may not describe all possible interactions. Give your health care provider a list of all the medicines, herbs, non-prescription drugs, or dietary supplements you use. Also tell them if you smoke, drink alcohol, or use illegal drugs. Some items may interact with your medicine. What should I watch for while using this medicine? Visit your doctor for checks on your progress. This drug may make you feel generally unwell. This is not uncommon, as chemotherapy can affect healthy cells as well as cancer cells. Report any side  effects. Continue your course of treatment even though you feel ill unless your doctor tells you to stop. In some cases, you may be given additional medicines to help with side effects. Follow all directions for their use. Call your doctor or health care professional for advice if you get a fever, chills or sore throat, or other symptoms of a cold or flu. Do not treat yourself. This drug decreases your body's ability to fight infections. Try to avoid being around people who are sick. This medicine may increase your risk to bruise or bleed. Call your doctor or health care professional if you notice any unusual bleeding. Be careful brushing and flossing your teeth or using a toothpick because you may get an infection or bleed more easily. If you have any dental work done, tell your dentist you are receiving this medicine. Avoid taking products that contain aspirin, acetaminophen, ibuprofen, naproxen, or ketoprofen unless instructed by your doctor. These medicines may hide a fever. Do not become pregnant while taking this medicine or for 6 months after stopping it. Women should inform their doctor if they wish to become pregnant or think they might be pregnant. Men should not father a child while taking this medicine and for 3 months after stopping it. There is a potential for serious side effects to an unborn  child. Talk to your health care professional or pharmacist for more information. Do not breast-feed an infant while taking this medicine or for at least 1 week after stopping it. Men should inform their doctors if they wish to father a child. This medicine may lower sperm counts. Talk with your doctor or health care professional if you are concerned about your fertility. What side effects may I notice from receiving this medicine? Side effects that you should report to your doctor or health care professional as soon as possible: allergic reactions like skin rash, itching or hives, swelling of the face,  lips, or tongue breathing problems pain, redness, or irritation at site where injected signs and symptoms of a dangerous change in heartbeat or heart rhythm like chest pain; dizziness; fast or irregular heartbeat; palpitations; feeling faint or lightheaded, falls; breathing problems signs of decreased platelets or bleeding - bruising, pinpoint red spots on the skin, black, tarry stools, blood in the urine signs of decreased red blood cells - unusually weak or tired, feeling faint or lightheaded, falls signs of infection - fever or chills, cough, sore throat, pain or difficulty passing urine signs and symptoms of kidney injury like trouble passing urine or change in the amount of urine signs and symptoms of liver injury like dark yellow or brown urine; general ill feeling or flu-like symptoms; light-colored stools; loss of appetite; nausea; right upper belly pain; unusually weak or tired; yellowing of the eyes or skin swelling of ankles, feet, hands Side effects that usually do not require medical attention (report to your doctor or health care professional if they continue or are bothersome): constipation diarrhea hair loss loss of appetite nausea rash vomiting This list may not describe all possible side effects. Call your doctor for medical advice about side effects. You may report side effects to FDA at 1-800-FDA-1088. Where should I keep my medicine? This drug is given in a hospital or clinic and will not be stored at home. NOTE: This sheet is a summary. It may not cover all possible information. If you have questions about this medicine, talk to your doctor, pharmacist, or health care provider.  2020 Elsevier/Gold Standard (2017-11-29 18:06:11)

## 2020-03-04 ENCOUNTER — Other Ambulatory Visit: Payer: Self-pay | Admitting: *Deleted

## 2020-03-04 DIAGNOSIS — C252 Malignant neoplasm of tail of pancreas: Secondary | ICD-10-CM

## 2020-03-04 LAB — CEA (IN HOUSE-CHCC): CEA (CHCC-In House): 57.87 ng/mL — ABNORMAL HIGH (ref 0.00–5.00)

## 2020-03-04 LAB — CA 125: Cancer Antigen (CA) 125: 1276 U/mL — ABNORMAL HIGH (ref 0.0–38.1)

## 2020-03-05 ENCOUNTER — Telehealth: Payer: Self-pay | Admitting: Genetic Counselor

## 2020-03-05 ENCOUNTER — Other Ambulatory Visit: Payer: 59

## 2020-03-05 ENCOUNTER — Ambulatory Visit: Payer: 59

## 2020-03-05 ENCOUNTER — Encounter: Payer: Self-pay | Admitting: *Deleted

## 2020-03-05 ENCOUNTER — Encounter: Payer: Self-pay | Admitting: Genetic Counselor

## 2020-03-05 NOTE — Telephone Encounter (Signed)
A genetic counseling appt has been scheduled for Angela Wilson to see Raquel Sarna on 7/20 at 11am.Letter mailed.

## 2020-03-06 ENCOUNTER — Other Ambulatory Visit: Payer: Self-pay | Admitting: Hematology & Oncology

## 2020-03-09 ENCOUNTER — Telehealth: Payer: Self-pay | Admitting: *Deleted

## 2020-03-09 ENCOUNTER — Other Ambulatory Visit: Payer: Self-pay | Admitting: *Deleted

## 2020-03-09 MED ORDER — FENTANYL 25 MCG/HR TD PT72: 2.0000 | MEDICATED_PATCH | TRANSDERMAL | 0 refills | Status: DC

## 2020-03-09 NOTE — Telephone Encounter (Signed)
Message received from [place] to inform Dr. Marin Olp that the Fentanyl 50 mcg patch is on back order and that a new prescription for two 25 mcg patches needs to be sent.  Dr. Marin Olp notified and new prescription sent.

## 2020-03-10 ENCOUNTER — Encounter: Payer: Self-pay | Admitting: *Deleted

## 2020-03-10 ENCOUNTER — Inpatient Hospital Stay: Payer: 59

## 2020-03-10 ENCOUNTER — Encounter: Payer: Self-pay | Admitting: Hematology & Oncology

## 2020-03-10 ENCOUNTER — Other Ambulatory Visit: Payer: Self-pay | Admitting: *Deleted

## 2020-03-10 ENCOUNTER — Other Ambulatory Visit: Payer: Self-pay

## 2020-03-10 ENCOUNTER — Inpatient Hospital Stay (HOSPITAL_BASED_OUTPATIENT_CLINIC_OR_DEPARTMENT_OTHER): Payer: 59 | Admitting: Hematology & Oncology

## 2020-03-10 VITALS — BP 154/81 | HR 117 | Temp 97.3°F | Resp 18 | Ht 64.0 in | Wt 223.0 lb

## 2020-03-10 DIAGNOSIS — C252 Malignant neoplasm of tail of pancreas: Secondary | ICD-10-CM | POA: Diagnosis not present

## 2020-03-10 DIAGNOSIS — K8689 Other specified diseases of pancreas: Secondary | ICD-10-CM

## 2020-03-10 DIAGNOSIS — Z95828 Presence of other vascular implants and grafts: Secondary | ICD-10-CM

## 2020-03-10 DIAGNOSIS — Z5111 Encounter for antineoplastic chemotherapy: Secondary | ICD-10-CM | POA: Diagnosis not present

## 2020-03-10 LAB — CBC WITH DIFFERENTIAL (CANCER CENTER ONLY)
Abs Immature Granulocytes: 0.26 10*3/uL — ABNORMAL HIGH (ref 0.00–0.07)
Basophils Absolute: 0 10*3/uL (ref 0.0–0.1)
Basophils Relative: 1 %
Eosinophils Absolute: 0.3 10*3/uL (ref 0.0–0.5)
Eosinophils Relative: 4 %
HCT: 35.4 % — ABNORMAL LOW (ref 36.0–46.0)
Hemoglobin: 11.2 g/dL — ABNORMAL LOW (ref 12.0–15.0)
Immature Granulocytes: 4 %
Lymphocytes Relative: 20 %
Lymphs Abs: 1.3 10*3/uL (ref 0.7–4.0)
MCH: 25.6 pg — ABNORMAL LOW (ref 26.0–34.0)
MCHC: 31.6 g/dL (ref 30.0–36.0)
MCV: 81 fL (ref 80.0–100.0)
Monocytes Absolute: 0.6 10*3/uL (ref 0.1–1.0)
Monocytes Relative: 8 %
Neutro Abs: 4.2 10*3/uL (ref 1.7–7.7)
Neutrophils Relative %: 63 %
Platelet Count: 177 10*3/uL (ref 150–400)
RBC: 4.37 MIL/uL (ref 3.87–5.11)
RDW: 14.7 % (ref 11.5–15.5)
WBC Count: 6.6 10*3/uL (ref 4.0–10.5)
nRBC: 0 % (ref 0.0–0.2)

## 2020-03-10 LAB — CMP (CANCER CENTER ONLY)
ALT: 25 U/L (ref 0–44)
AST: 25 U/L (ref 15–41)
Albumin: 3.6 g/dL (ref 3.5–5.0)
Alkaline Phosphatase: 42 U/L (ref 38–126)
Anion gap: 7 (ref 5–15)
BUN: 7 mg/dL (ref 6–20)
CO2: 25 mmol/L (ref 22–32)
Calcium: 9.2 mg/dL (ref 8.9–10.3)
Chloride: 99 mmol/L (ref 98–111)
Creatinine: 0.53 mg/dL (ref 0.44–1.00)
GFR, Est AFR Am: >60 mL/min (ref >60)
GFR, Estimated: >60 mL/min (ref >60)
Glucose, Bld: 236 mg/dL — ABNORMAL HIGH (ref 70–99)
Potassium: 3.9 mmol/L (ref 3.5–5.1)
Sodium: 131 mmol/L — ABNORMAL LOW (ref 135–145)
Total Bilirubin: 0.4 mg/dL (ref 0.3–1.2)
Total Protein: 7.2 g/dL (ref 6.5–8.1)

## 2020-03-10 MED ORDER — PROCHLORPERAZINE MALEATE 10 MG PO TABS: 10.0000 mg | ORAL_TABLET | Freq: Four times a day (QID) | ORAL | 3 refills | Status: DC | PRN

## 2020-03-10 MED ORDER — HEPARIN SOD (PORK) LOCK FLUSH 100 UNIT/ML IV SOLN
500.0000 [IU] | Freq: Once | INTRAVENOUS | Status: AC | PRN
  Administered 2020-03-10: 500 [IU]
  Filled 2020-03-10: qty 5

## 2020-03-10 MED ORDER — PACLITAXEL PROTEIN-BOUND CHEMO INJECTION 100 MG
100.0000 mg/m2 | Freq: Once | INTRAVENOUS | Status: AC
  Administered 2020-03-10: 200 mg via INTRAVENOUS
  Filled 2020-03-10: qty 40

## 2020-03-10 MED ORDER — SODIUM CHLORIDE 0.9% FLUSH
10.0000 mL | INTRAVENOUS | Status: DC | PRN
  Administered 2020-03-10: 10 mL
  Filled 2020-03-10: qty 10

## 2020-03-10 MED ORDER — HYDROMORPHONE HCL 1 MG/ML IJ SOLN
2.0000 mg | Freq: Once | INTRAMUSCULAR | Status: AC
  Administered 2020-03-10: 2 mg via INTRAVENOUS

## 2020-03-10 MED ORDER — PROCHLORPERAZINE MALEATE 10 MG PO TABS
ORAL_TABLET | ORAL | Status: AC
  Filled 2020-03-10: qty 1

## 2020-03-10 MED ORDER — SODIUM CHLORIDE 0.9 % IV SOLN
Freq: Once | INTRAVENOUS | Status: AC
  Filled 2020-03-10: qty 250

## 2020-03-10 MED ORDER — SODIUM CHLORIDE 0.9 % IV SOLN
2000.0000 mg | Freq: Once | INTRAVENOUS | Status: AC
  Administered 2020-03-10: 2000 mg via INTRAVENOUS
  Filled 2020-03-10: qty 52.6

## 2020-03-10 MED ORDER — HYDROMORPHONE HCL 1 MG/ML IJ SOLN
INTRAMUSCULAR | Status: AC
  Filled 2020-03-10: qty 2

## 2020-03-10 MED ORDER — PROCHLORPERAZINE MALEATE 10 MG PO TABS
10.0000 mg | ORAL_TABLET | Freq: Once | ORAL | Status: AC
  Administered 2020-03-10: 10 mg via ORAL

## 2020-03-10 MED ORDER — SODIUM CHLORIDE 0.9% FLUSH
10.0000 mL | Freq: Once | INTRAVENOUS | Status: AC
  Administered 2020-03-10: 10 mL via INTRAVENOUS
  Filled 2020-03-10: qty 10

## 2020-03-10 MED FILL — PROCHLORPERAZINE 10 MG TAB: 10 | 23 days supply | Qty: 90 | Fill #0

## 2020-03-10 NOTE — Patient Instructions (Signed)
Gemcitabine injection What is this medicine? GEMCITABINE (jem SYE ta been) is a chemotherapy drug. This medicine is used to treat many types of cancer like breast cancer, lung cancer, pancreatic cancer, and ovarian cancer. This medicine may be used for other purposes; ask your health care provider or pharmacist if you have questions. COMMON BRAND NAME(S): Gemzar, Infugem What should I tell my health care provider before I take this medicine? They need to know if you have any of these conditions:  blood disorders  infection  kidney disease  liver disease  lung or breathing disease, like asthma  recent or ongoing radiation therapy  an unusual or allergic reaction to gemcitabine, other chemotherapy, other medicines, foods, dyes, or preservatives  pregnant or trying to get pregnant  breast-feeding How should I use this medicine? This drug is given as an infusion into a vein. It is administered in a hospital or clinic by a specially trained health care professional. Talk to your pediatrician regarding the use of this medicine in children. Special care may be needed. Overdosage: If you think you have taken too much of this medicine contact a poison control center or emergency room at once. NOTE: This medicine is only for you. Do not share this medicine with others. What if I miss a dose? It is important not to miss your dose. Call your doctor or health care professional if you are unable to keep an appointment. What may interact with this medicine?  medicines to increase blood counts like filgrastim, pegfilgrastim, sargramostim  some other chemotherapy drugs like cisplatin  vaccines Talk to your doctor or health care professional before taking any of these medicines:  acetaminophen  aspirin  ibuprofen  ketoprofen  naproxen This list may not describe all possible interactions. Give your health care provider a list of all the medicines, herbs, non-prescription drugs, or  dietary supplements you use. Also tell them if you smoke, drink alcohol, or use illegal drugs. Some items may interact with your medicine. What should I watch for while using this medicine? Visit your doctor for checks on your progress. This drug may make you feel generally unwell. This is not uncommon, as chemotherapy can affect healthy cells as well as cancer cells. Report any side effects. Continue your course of treatment even though you feel ill unless your doctor tells you to stop. In some cases, you may be given additional medicines to help with side effects. Follow all directions for their use. Call your doctor or health care professional for advice if you get a fever, chills or sore throat, or other symptoms of a cold or flu. Do not treat yourself. This drug decreases your body's ability to fight infections. Try to avoid being around people who are sick. This medicine may increase your risk to bruise or bleed. Call your doctor or health care professional if you notice any unusual bleeding. Be careful brushing and flossing your teeth or using a toothpick because you may get an infection or bleed more easily. If you have any dental work done, tell your dentist you are receiving this medicine. Avoid taking products that contain aspirin, acetaminophen, ibuprofen, naproxen, or ketoprofen unless instructed by your doctor. These medicines may hide a fever. Do not become pregnant while taking this medicine or for 6 months after stopping it. Women should inform their doctor if they wish to become pregnant or think they might be pregnant. Men should not father a child while taking this medicine and for 3 months after stopping it.   There is a potential for serious side effects to an unborn child. Talk to your health care professional or pharmacist for more information. Do not breast-feed an infant while taking this medicine or for at least 1 week after stopping it. Men should inform their doctors if they wish  to father a child. This medicine may lower sperm counts. Talk with your doctor or health care professional if you are concerned about your fertility. What side effects may I notice from receiving this medicine? Side effects that you should report to your doctor or health care professional as soon as possible:  allergic reactions like skin rash, itching or hives, swelling of the face, lips, or tongue  breathing problems  pain, redness, or irritation at site where injected  signs and symptoms of a dangerous change in heartbeat or heart rhythm like chest pain; dizziness; fast or irregular heartbeat; palpitations; feeling faint or lightheaded, falls; breathing problems  signs of decreased platelets or bleeding - bruising, pinpoint red spots on the skin, black, tarry stools, blood in the urine  signs of decreased red blood cells - unusually weak or tired, feeling faint or lightheaded, falls  signs of infection - fever or chills, cough, sore throat, pain or difficulty passing urine  signs and symptoms of kidney injury like trouble passing urine or change in the amount of urine  signs and symptoms of liver injury like dark yellow or brown urine; general ill feeling or flu-like symptoms; light-colored stools; loss of appetite; nausea; right upper belly pain; unusually weak or tired; yellowing of the eyes or skin  swelling of ankles, feet, hands Side effects that usually do not require medical attention (report to your doctor or health care professional if they continue or are bothersome):  constipation  diarrhea  hair loss  loss of appetite  nausea  rash  vomiting This list may not describe all possible side effects. Call your doctor for medical advice about side effects. You may report side effects to FDA at 1-800-FDA-1088. Where should I keep my medicine? This drug is given in a hospital or clinic and will not be stored at home. NOTE: This sheet is a summary. It may not cover all  possible information. If you have questions about this medicine, talk to your doctor, pharmacist, or health care provider.  2020 Elsevier/Gold Standard (2017-11-29 18:06:11)    Nanoparticle Albumin-Bound Paclitaxel injection What is this medicine? NANOPARTICLE ALBUMIN-BOUND PACLITAXEL (Na no PAHR ti kuhl al BYOO muhn-bound PAK li TAX el) is a chemotherapy drug. It targets fast dividing cells, like cancer cells, and causes these cells to die. This medicine is used to treat advanced breast cancer, lung cancer, and pancreatic cancer. This medicine may be used for other purposes; ask your health care provider or pharmacist if you have questions. COMMON BRAND NAME(S): Abraxane What should I tell my health care provider before I take this medicine? They need to know if you have any of these conditions:  kidney disease  liver disease  low blood counts, like low white cell, platelet, or red cell counts  lung or breathing disease, like asthma  tingling of the fingers or toes, or other nerve disorder  an unusual or allergic reaction to paclitaxel, albumin, other chemotherapy, other medicines, foods, dyes, or preservatives  pregnant or trying to get pregnant  breast-feeding How should I use this medicine? This drug is given as an infusion into a vein. It is administered in a hospital or clinic by a specially trained health care   Talk to your pediatrician regarding the use of this medicine in children. Special care may be needed. Overdosage: If you think you have taken too much of this medicine contact a poison control center or emergency room at once. NOTE: This medicine is only for you. Do not share this medicine with others. What if I miss a dose? It is important not to miss your dose. Call your doctor or health care professional if you are unable to keep an appointment. What may interact with this medicine? This medicine may interact with the following  medications:  antiviral medicines for hepatitis, HIV or AIDS  certain antibiotics like erythromycin and clarithromycin  certain medicines for fungal infections like ketoconazole and itraconazole  certain medicines for seizures like carbamazepine, phenobarbital, phenytoin  gemfibrozil  nefazodone  rifampin  St. John's wort This list may not describe all possible interactions. Give your health care provider a list of all the medicines, herbs, non-prescription drugs, or dietary supplements you use. Also tell them if you smoke, drink alcohol, or use illegal drugs. Some items may interact with your medicine. What should I watch for while using this medicine? Your condition will be monitored carefully while you are receiving this medicine. You will need important blood work done while you are taking this medicine. This medicine can cause serious allergic reactions. If you experience allergic reactions like skin rash, itching or hives, swelling of the face, lips, or tongue, tell your doctor or health care professional right away. In some cases, you may be given additional medicines to help with side effects. Follow all directions for their use. This drug may make you feel generally unwell. This is not uncommon, as chemotherapy can affect healthy cells as well as cancer cells. Report any side effects. Continue your course of treatment even though you feel ill unless your doctor tells you to stop. Call your doctor or health care professional for advice if you get a fever, chills or sore throat, or other symptoms of a cold or flu. Do not treat yourself. This drug decreases your body's ability to fight infections. Try to avoid being around people who are sick. This medicine may increase your risk to bruise or bleed. Call your doctor or health care professional if you notice any unusual bleeding. Be careful brushing and flossing your teeth or using a toothpick because you may get an infection or bleed  more easily. If you have any dental work done, tell your dentist you are receiving this medicine. Avoid taking products that contain aspirin, acetaminophen, ibuprofen, naproxen, or ketoprofen unless instructed by your doctor. These medicines may hide a fever. Do not become pregnant while taking this medicine or for 6 months after stopping it. Women should inform their doctor if they wish to become pregnant or think they might be pregnant. Men should not father a child while taking this medicine or for 3 months after stopping it. There is a potential for serious side effects to an unborn child. Talk to your health care professional or pharmacist for more information. Do not breast-feed an infant while taking this medicine or for 2 weeks after stopping it. This medicine may interfere with the ability to get pregnant or to father a child. You should talk to your doctor or health care professional if you are concerned about your fertility. What side effects may I notice from receiving this medicine? Side effects that you should report to your doctor or health care professional as soon as possible:  allergic reactions like   like skin rash, itching or hives, swelling of the face, lips, or tongue  breathing problems  changes in vision  fast, irregular heartbeat  low blood pressure  mouth sores  pain, tingling, numbness in the hands or feet  signs of decreased platelets or bleeding - bruising, pinpoint red spots on the skin, black, tarry stools, blood in the urine  signs of decreased red blood cells - unusually weak or tired, feeling faint or lightheaded, falls  signs of infection - fever or chills, cough, sore throat, pain or difficulty passing urine  signs and symptoms of liver injury like dark yellow or brown urine; general ill feeling or flu-like symptoms; light-colored stools; loss of appetite; nausea; right upper belly pain; unusually weak or tired; yellowing of the eyes or skin  swelling of  the ankles, feet, hands  unusually slow heartbeat Side effects that usually do not require medical attention (report to your doctor or health care professional if they continue or are bothersome):  diarrhea  hair loss  loss of appetite  nausea, vomiting  tiredness This list may not describe all possible side effects. Call your doctor for medical advice about side effects. You may report side effects to FDA at 1-800-FDA-1088. Where should I keep my medicine? This drug is given in a hospital or clinic and will not be stored at home. NOTE: This sheet is a summary. It may not cover all possible information. If you have questions about this medicine, talk to your doctor, pharmacist, or health care provider.  2020 Elsevier/Gold Standard (2017-05-09 13:03:45)  

## 2020-03-10 NOTE — Progress Notes (Signed)
Ok with HR 117 per Dr Marin Olp. dph

## 2020-03-10 NOTE — Progress Notes (Signed)
Hematology and Oncology Follow Up Visit  Angela Wilson [id number] 03-09-1966 54 y.o. 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  I am absolutely amazed as to how well she is doing.  She looks really good.  Her daughter says that she is feeling better.  She is not having much abdominal pain.  She does have some vomiting after treatment.  She is on Zofran and Compazine.  She has had no bleeding.  There is been no mouth sores.  She has had no problems with cough or increased shortness of breath.  She had a chest x-ray last week.  There is still some fluid on the right side of the lung.  I do not think that there is enough to really put her through a thoracentesis.  She has had no diarrhea.  I have to say that overall, her performance status is probably ECOG 1.    Medications:  Current Outpatient Medications:  .  albuterol (PROVENTIL HFA;VENTOLIN HFA) 108 (90 Base) MCG/ACT inhaler, Inhale 2 puffs into the lungs every 4 (four) hours as needed for wheezing or shortness of breath., Disp: 1 Inhaler, Rfl: 0 .  amLODipine (NORVASC) 10 MG tablet, Take 10 mg by mouth daily., Disp: , Rfl:  .  amLODipine (NORVASC) 5 MG tablet, Take 1 tablet (5 mg total) by mouth daily., Disp: 30 tablet, Rfl: 2 .  aspirin 81 MG chewable tablet, Chew 81 mg by mouth daily., Disp: , Rfl:  .  cetirizine (ZYRTEC) 10 MG tablet, Take 10 mg by mouth daily., Disp: , Rfl:  .  CREON 36000-114000 units CPEP capsule, , Disp: , Rfl:  .  ergocalciferol (VITAMIN D2) 1.25 MG (50000 UT) capsule, Take 50,000 Units by mouth once a week. , Disp: , Rfl:  .  fentaNYL (DURAGESIC) 25 MCG/HR, Place 2 patches onto the skin every 3 (three) days., Disp: 20 patch,  Rfl: 0 .  fentaNYL (DURAGESIC) 50 MCG/HR, Place 1 patch onto the skin every 3 (three) days., Disp: 10 patch, Rfl: 0 .  glipiZIDE (GLUCOTROL) 10 MG tablet, Take 10 mg by mouth 2 (two) times daily., Disp: , Rfl:  .  lidocaine-prilocaine (EMLA) cream, APPLY TOPICALLY ONCE FOR 1 DOSE, Disp: , Rfl:  .  lisinopril (ZESTRIL) 10 MG tablet, Take 10 mg by mouth daily., Disp: , Rfl:  .  metFORMIN (GLUCOPHAGE XR) 500 MG 24 hr tablet, Take 1 tablet (500 mg total) by mouth daily with breakfast., Disp: 30 tablet, Rfl: 1 .  metoCLOPramide (REGLAN) 5 MG tablet, Take 1 tablet (5 mg total) by mouth 4 (four) times daily -  before meals and at bedtime., Disp: 45 tablet, Rfl: 3 .  omeprazole (PRILOSEC) 40 MG capsule, Take 40 mg by mouth daily., Disp: , Rfl:  .  ondansetron (ZOFRAN) 4 MG tablet, Take 1 tablet (4 mg total) by mouth every 6 (six) hours as needed for nausea., Disp: 20 tablet, Rfl: 0 .  oxyCODONE 10 MG TABS, Take 1 tablet (10 mg total) by mouth every 6 (six) hours as needed for severe pain., Disp: 90 tablet, Rfl: 0 .  polyethylene glycol (MIRALAX / GLYCOLAX) 17 g packet, Take 17 g by mouth daily as needed. (Patient not taking: Reported on 02/25/2020),  Disp: 14 each, Rfl: 0 .  pravastatin (PRAVACHOL) 40 MG tablet, Take 40 mg by mouth daily., Disp: , Rfl:  .  prochlorperazine (COMPAZINE) 10 MG tablet, Take 1 tablet (10 mg total) by mouth every 6 (six) hours as needed for nausea or vomiting., Disp: 30 tablet, Rfl: 0 .  senna-docusate (SENOKOT-S) 8.6-50 MG tablet, Take 1 tablet by mouth 2 (two) times daily. (Patient not taking: Reported on 02/25/2020), Disp: 60 tablet, Rfl: 0  Allergies:  Allergies  Allergen Reactions  . Iodinated Diagnostic Agents Itching and Rash  . Ivp Dye [Iodinated Diagnostic Agents] Itching and Rash  . Morphine And Related Hives  . Penicillins Rash    Past Medical History, Surgical history, Social history, and Family History were reviewed and updated.  Review of Systems: Review of  Systems  Constitutional: Negative.   HENT:  Negative.   Eyes: Negative.   Respiratory: Negative.   Cardiovascular: Negative.   Gastrointestinal: Positive for abdominal pain and nausea.  Endocrine: Negative.   Genitourinary: Negative.    Musculoskeletal: Negative.   Skin: Negative.   Neurological: Negative.   Hematological: Negative.   Psychiatric/Behavioral: Negative.     Physical Exam:  weight is 223 lb (101.2 kg).   Wt Readings from Last 3 Encounters:  03/10/20 223 lb (101.2 kg)  03/10/20 223 lb (101.2 kg)  03/03/20 230 lb 0.6 oz (104.3 kg)   Her vital signs show a temperature of 97.1.  Pulse is 100.  Blood pressure 116/70.  Weight is 230 pounds.  Physical Exam Vitals reviewed.  HENT:     Head: Normocephalic and atraumatic.  Eyes:     Pupils: Pupils are equal, round, and reactive to light.  Cardiovascular:     Rate and Rhythm: Normal rate and regular rhythm.     Heart sounds: Normal heart sounds.  Pulmonary:     Effort: Pulmonary effort is normal.     Breath sounds: Normal breath sounds.  Abdominal:     General: Bowel sounds are normal.     Palpations: Abdomen is soft.  Musculoskeletal:        General: No tenderness or deformity. Normal range of motion.     Cervical back: Normal range of motion.  Lymphadenopathy:     Cervical: No cervical adenopathy.  Skin:    General: Skin is warm and dry.     Findings: No erythema or rash.  Neurological:     Mental Status: She is alert and oriented to person, place, and time.  Psychiatric:        Behavior: Behavior normal.        Thought Content: Thought content normal.        Judgment: Judgment normal.      Lab Results  Component Value Date   WBC 6.6 03/10/2020   HGB 11.2 (L) 03/10/2020   HCT 35.4 (L) 03/10/2020   MCV 81.0 03/10/2020   PLT 177 03/10/2020     Chemistry      Component Value Date/Time   NA 131 (L) 03/10/2020 1025   NA 136 01/07/2020 1256   K 3.9 03/10/2020 1025   CL 99 03/10/2020 1025   CO2  25 03/10/2020 1025   BUN 7 03/10/2020 1025   BUN 10 01/07/2020 1256   CREATININE 0.53 03/10/2020 1025      Component Value Date/Time   CALCIUM 9.2 03/10/2020 1025   ALKPHOS 42 03/10/2020 1025   AST 25 03/10/2020 1025   ALT 25 03/10/2020 1025   BILITOT 0.4 03/10/2020  1025       Impression and Plan: Angela Wilson is a very nice 54 year old woman from [place].  She has metastatic pancreatic cancer.  This is clearly aggressive.  She really had no evidence of metastatic disease back in April.  I really thought when I first saw her in May that she will have surgery to resect this.  However, it is clearly progressed outside the confines of surgery.  We will go ahead with day 15 of cycle 1 treatment.    Hopefully, we will start to see that she is responding.  We will see what her tumor markers are.  I know she has had an elevated CEA and CA-125.  These are certainly unusual tumor markers for pancreatic cancer.  We will have her come back right after July 4 holiday.  After her second cycle of treatment, then we will do our scans.       Volanda Napoleon, MD 6/22/202111:05 AM

## 2020-03-10 NOTE — Progress Notes (Signed)
Oncology Nurse Navigator Documentation  Oncology Nurse Navigator Flowsheets 03/10/2020  Confirmed Diagnosis Date -  Diagnosis Status -  Planned Course of Treatment -  Phase of Treatment -  Chemotherapy Pending- Reason: -  Chemotherapy Actual Start Date: -  Navigator Follow Up Date: -  Navigator Follow Up Reason: -  Navigator Location [facility]  Referral Date to RadOnc/MedOnc -  Navigator Encounter Type Treatment  Telephone -  Treatment Initiated Date -  Patient Visit Type MedOnc  Treatment Phase Active Tx  Barriers/Navigation Needs Anxiety;Cultural Needs;Family Concerns;Language/Communication;Pain  Education -  Interventions Psycho-Social Support  Acuity Level 4-High Needs (Greater Than 4 Barriers Identified)  Referrals -  Coordination of Care -  Education Method -  Support Groups/Services Friends and Family  Time Spent with Patient 30

## 2020-03-17 ENCOUNTER — Encounter: Payer: Self-pay | Admitting: *Deleted

## 2020-03-18 ENCOUNTER — Other Ambulatory Visit: Payer: Self-pay | Admitting: *Deleted

## 2020-03-18 MED ORDER — HYDROCODONE-HOMATROPINE 5-1.5 MG/5ML PO SYRP: 5.0000 mL | ORAL_SOLUTION | Freq: Four times a day (QID) | ORAL | 0 refills | Status: DC | PRN

## 2020-03-24 ENCOUNTER — Inpatient Hospital Stay (HOSPITAL_BASED_OUTPATIENT_CLINIC_OR_DEPARTMENT_OTHER): Payer: 59 | Admitting: Hematology & Oncology

## 2020-03-24 ENCOUNTER — Inpatient Hospital Stay: Payer: 59

## 2020-03-24 ENCOUNTER — Other Ambulatory Visit: Payer: Self-pay | Admitting: *Deleted

## 2020-03-24 ENCOUNTER — Encounter: Payer: Self-pay | Admitting: Hematology & Oncology

## 2020-03-24 ENCOUNTER — Other Ambulatory Visit: Payer: Self-pay

## 2020-03-24 ENCOUNTER — Inpatient Hospital Stay: Payer: 59 | Attending: Hematology & Oncology

## 2020-03-24 VITALS — BP 132/62 | HR 109 | Temp 98.7°F | Resp 19 | Wt 212.0 lb

## 2020-03-24 DIAGNOSIS — C779 Secondary and unspecified malignant neoplasm of lymph node, unspecified: Secondary | ICD-10-CM | POA: Diagnosis not present

## 2020-03-24 DIAGNOSIS — J910 Malignant pleural effusion: Secondary | ICD-10-CM | POA: Insufficient documentation

## 2020-03-24 DIAGNOSIS — C252 Malignant neoplasm of tail of pancreas: Secondary | ICD-10-CM | POA: Diagnosis not present

## 2020-03-24 DIAGNOSIS — R634 Abnormal weight loss: Secondary | ICD-10-CM | POA: Insufficient documentation

## 2020-03-24 DIAGNOSIS — Z5111 Encounter for antineoplastic chemotherapy: Secondary | ICD-10-CM | POA: Insufficient documentation

## 2020-03-24 DIAGNOSIS — R197 Diarrhea, unspecified: Secondary | ICD-10-CM | POA: Diagnosis not present

## 2020-03-24 LAB — CMP (CANCER CENTER ONLY)
ALT: 19 U/L (ref 0–44)
AST: 19 U/L (ref 15–41)
Albumin: 4.1 g/dL (ref 3.5–5.0)
Alkaline Phosphatase: 42 U/L (ref 38–126)
Anion gap: 9 (ref 5–15)
BUN: 8 mg/dL (ref 6–20)
CO2: 25 mmol/L (ref 22–32)
Calcium: 9.5 mg/dL (ref 8.9–10.3)
Chloride: 101 mmol/L (ref 98–111)
Creatinine: 0.55 mg/dL (ref 0.44–1.00)
GFR, Est AFR Am: >60 mL/min (ref >60)
GFR, Estimated: >60 mL/min (ref >60)
Glucose, Bld: 248 mg/dL — ABNORMAL HIGH (ref 70–99)
Potassium: 4 mmol/L (ref 3.5–5.1)
Sodium: 135 mmol/L (ref 135–145)
Total Bilirubin: 0.4 mg/dL (ref 0.3–1.2)
Total Protein: 8 g/dL (ref 6.5–8.1)

## 2020-03-24 LAB — CBC WITH DIFFERENTIAL (CANCER CENTER ONLY)
Abs Immature Granulocytes: 0.04 K/uL (ref 0.00–0.07)
Basophils Absolute: 0 K/uL (ref 0.0–0.1)
Basophils Relative: 1 %
Eosinophils Absolute: 0.2 K/uL (ref 0.0–0.5)
Eosinophils Relative: 3 %
HCT: 37 % (ref 36.0–46.0)
Hemoglobin: 11.5 g/dL — ABNORMAL LOW (ref 12.0–15.0)
Immature Granulocytes: 1 %
Lymphocytes Relative: 25 %
Lymphs Abs: 1.6 K/uL (ref 0.7–4.0)
MCH: 25.7 pg — ABNORMAL LOW (ref 26.0–34.0)
MCHC: 31.1 g/dL (ref 30.0–36.0)
MCV: 82.6 fL (ref 80.0–100.0)
Monocytes Absolute: 0.4 K/uL (ref 0.1–1.0)
Monocytes Relative: 7 %
Neutro Abs: 4.1 K/uL (ref 1.7–7.7)
Neutrophils Relative %: 63 %
Platelet Count: 354 K/uL (ref 150–400)
RBC: 4.48 MIL/uL (ref 3.87–5.11)
RDW: 16.6 % — ABNORMAL HIGH (ref 11.5–15.5)
WBC Count: 6.5 K/uL (ref 4.0–10.5)
nRBC: 0 % (ref 0.0–0.2)

## 2020-03-24 MED ORDER — SODIUM CHLORIDE 0.9% FLUSH
10.0000 mL | INTRAVENOUS | Status: DC | PRN
  Administered 2020-03-24: 10 mL
  Filled 2020-03-24: qty 10

## 2020-03-24 MED ORDER — HEPARIN SOD (PORK) LOCK FLUSH 100 UNIT/ML IV SOLN
500.0000 [IU] | Freq: Once | INTRAVENOUS | Status: AC | PRN
  Administered 2020-03-24: 500 [IU]
  Filled 2020-03-24: qty 5

## 2020-03-24 MED ORDER — SODIUM CHLORIDE 0.9 % IV SOLN
Freq: Once | INTRAVENOUS | Status: AC
  Filled 2020-03-24: qty 250

## 2020-03-24 MED ORDER — SODIUM CHLORIDE 0.9 % IV SOLN
2000.0000 mg | Freq: Once | INTRAVENOUS | Status: AC
  Administered 2020-03-24: 2000 mg via INTRAVENOUS
  Filled 2020-03-24: qty 52.6

## 2020-03-24 MED ORDER — DRONABINOL 2.5 MG PO CAPS
2.5000 mg | ORAL_CAPSULE | Freq: Two times a day (BID) | ORAL | 0 refills | Status: DC
Start: 2020-03-24 — End: 2020-03-24

## 2020-03-24 MED ORDER — HYDROMORPHONE HCL 1 MG/ML IJ SOLN
INTRAMUSCULAR | Status: AC
  Filled 2020-03-24: qty 2

## 2020-03-24 MED ORDER — PROCHLORPERAZINE MALEATE 10 MG PO TABS
ORAL_TABLET | ORAL | Status: AC
  Filled 2020-03-24: qty 1

## 2020-03-24 MED ORDER — DRONABINOL 2.5 MG PO CAPS: 2.5000 mg | ORAL_CAPSULE | Freq: Two times a day (BID) | ORAL | 0 refills | Status: DC

## 2020-03-24 MED ORDER — HYDROMORPHONE HCL 1 MG/ML IJ SOLN
2.0000 mg | Freq: Once | INTRAMUSCULAR | Status: AC
  Administered 2020-03-24: 2 mg via INTRAVENOUS

## 2020-03-24 MED ORDER — PACLITAXEL PROTEIN-BOUND CHEMO INJECTION 100 MG
100.0000 mg/m2 | Freq: Once | INTRAVENOUS | Status: AC
  Administered 2020-03-24: 200 mg via INTRAVENOUS
  Filled 2020-03-24: qty 40

## 2020-03-24 MED ORDER — PROCHLORPERAZINE MALEATE 10 MG PO TABS
10.0000 mg | ORAL_TABLET | Freq: Once | ORAL | Status: AC
  Administered 2020-03-24: 10 mg via ORAL

## 2020-03-24 NOTE — Progress Notes (Signed)
Pt. Is accompanied by her daughter who does interpreting for her.

## 2020-03-24 NOTE — Patient Instructions (Signed)
[facility] Discharge Instructions for Patients Receiving Chemotherapy  Today you received the following chemotherapy agents Abraxane, Gemzar  To help prevent nausea and vomiting after your treatment, we encourage you to take your nausea medication    If you develop nausea and vomiting that is not controlled by your nausea medication, call the clinic.   BELOW ARE SYMPTOMS THAT SHOULD BE REPORTED IMMEDIATELY:  *FEVER GREATER THAN 100.5 F  *CHILLS WITH OR WITHOUT FEVER  NAUSEA AND VOMITING THAT IS NOT CONTROLLED WITH YOUR NAUSEA MEDICATION  *UNUSUAL SHORTNESS OF BREATH  *UNUSUAL BRUISING OR BLEEDING  TENDERNESS IN MOUTH AND THROAT WITH OR WITHOUT PRESENCE OF ULCERS  *URINARY PROBLEMS  *BOWEL PROBLEMS  UNUSUAL RASH Items with * indicate a potential emergency and should be followed up as soon as possible.  Feel free to call the clinic should you have any questions or concerns. The clinic phone number is [phone].  Please show the [place] at check-in to the Emergency Department and triage nurse.

## 2020-03-24 NOTE — Progress Notes (Signed)
Hematology and Oncology Follow Up Visit  Angela Wilson [id number] June 26, 1966 55 y.o. 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 is back for follow-up.  Overall, she seems to be doing a bit better.  She is not having as much abdominal pain.  She is losing some weight, however.  I will try her on some Marinol to see if this might help.  She has had no diarrhea.  She really has had no problems with nausea or vomiting.  She had a little bit of diarrhea but this seems to be pretty well controlled.  There is no bleeding.  She has lost her hair which has been a little bit traumatic for her.  She has had no issues with cough.  There is no increased shortness of breath.  Overall, her performance status is probably ECOG 1.    Medications:  Current Outpatient Medications:  .  albuterol (PROVENTIL HFA;VENTOLIN HFA) 108 (90 Base) MCG/ACT inhaler, Inhale 2 puffs into the lungs every 4 (four) hours as needed for wheezing or shortness of breath., Disp: 1 Inhaler, Rfl: 0 .  amLODipine (NORVASC) 10 MG tablet, Take 10 mg by mouth daily., Disp: , Rfl:  .  amLODipine (NORVASC) 5 MG tablet, Take 1 tablet (5 mg total) by mouth daily., Disp: 30 tablet, Rfl: 2 .  aspirin 81 MG chewable tablet, Chew 81 mg by mouth daily., Disp: , Rfl:  .  cetirizine (ZYRTEC) 10 MG tablet, Take 10 mg by mouth daily., Disp: , Rfl:  .  CREON 36000-114000 units CPEP capsule, , Disp: , Rfl:  .  ergocalciferol (VITAMIN D2) 1.25 MG (50000 UT) capsule, Take 50,000 Units by mouth once a week. , Disp: , Rfl:  .  fentaNYL (DURAGESIC) 25 MCG/HR, Place 2 patches onto the skin every 3 (three) days., Disp: 20 patch, Rfl: 0 .  fentaNYL (DURAGESIC) 50 MCG/HR, Place 1 patch onto the skin every 3 (three) days., Disp: 10 patch, Rfl: 0 .   glipiZIDE (GLUCOTROL) 10 MG tablet, Take 10 mg by mouth 2 (two) times daily., Disp: , Rfl:  .  HYDROcodone-homatropine (HYCODAN) 5-1.5 MG/5ML syrup, Take 5 mLs by mouth every 6 (six) hours as needed for cough., Disp: 120 mL, Rfl: 0 .  lidocaine-prilocaine (EMLA) cream, APPLY TOPICALLY ONCE FOR 1 DOSE, Disp: , Rfl:  .  lisinopril (ZESTRIL) 10 MG tablet, Take 10 mg by mouth daily., Disp: , Rfl:  .  metFORMIN (GLUCOPHAGE XR) 500 MG 24 hr tablet, Take 1 tablet (500 mg total) by mouth daily with breakfast., Disp: 30 tablet, Rfl: 1 .  metoCLOPramide (REGLAN) 5 MG tablet, Take 1 tablet (5 mg total) by mouth 4 (four) times daily -  before meals and at bedtime., Disp: 45 tablet, Rfl: 3 .  omeprazole (PRILOSEC) 40 MG capsule, Take 40 mg by mouth daily., Disp: , Rfl:  .  ondansetron (ZOFRAN) 4 MG tablet, Take 1 tablet (4 mg total) by mouth every 6 (six) hours as needed for nausea., Disp: 20 tablet, Rfl: 0 .  oxyCODONE 10 MG TABS, Take 1 tablet (10 mg total) by mouth every 6 (six) hours as needed for severe pain., Disp: 90 tablet, Rfl: 0 .  polyethylene glycol (MIRALAX / GLYCOLAX) 17 g packet, Take 17 g by mouth daily as needed. (Patient not taking: Reported  on 02/25/2020), Disp: 14 each, Rfl: 0 .  pravastatin (PRAVACHOL) 40 MG tablet, Take 40 mg by mouth daily., Disp: , Rfl:  .  prochlorperazine (COMPAZINE) 10 MG tablet, Take 1 tablet (10 mg total) by mouth every 6 (six) hours as needed for nausea or vomiting., Disp: 90 tablet, Rfl: 3  Allergies:  Allergies  Allergen Reactions  . Iodinated Diagnostic Agents Itching and Rash  . Ivp Dye [Iodinated Diagnostic Agents] Itching and Rash  . Morphine And Related Hives  . Penicillins Rash    Past Medical History, Surgical history, Social history, and Family History were reviewed and updated.  Review of Systems: Review of Systems  Constitutional: Negative.   HENT:  Negative.   Eyes: Negative.   Respiratory: Negative.   Cardiovascular: Negative.     Gastrointestinal: Positive for abdominal pain and nausea.  Endocrine: Negative.   Genitourinary: Negative.    Musculoskeletal: Negative.   Skin: Negative.   Neurological: Negative.   Hematological: Negative.   Psychiatric/Behavioral: Negative.     Physical Exam:  weight is 212 lb (96.2 kg). Her oral temperature is 98.7 F (37.1 C). Her blood pressure is 132/62 and her pulse is 109 (abnormal). Her respiration is 19 and oxygen saturation is 99%.   Wt Readings from Last 3 Encounters:  03/24/20 212 lb (96.2 kg)  03/10/20 223 lb (101.2 kg)  03/10/20 223 lb (101.2 kg)   Her vital signs show a temperature of 97.1.  Pulse is 100.  Blood pressure 116/70.  Weight is 230 pounds.  Physical Exam Vitals reviewed.  HENT:     Head: Normocephalic and atraumatic.  Eyes:     Pupils: Pupils are equal, round, and reactive to light.  Cardiovascular:     Rate and Rhythm: Normal rate and regular rhythm.     Heart sounds: Normal heart sounds.  Pulmonary:     Effort: Pulmonary effort is normal.     Breath sounds: Normal breath sounds.  Abdominal:     General: Bowel sounds are normal.     Palpations: Abdomen is soft.  Musculoskeletal:        General: No tenderness or deformity. Normal range of motion.     Cervical back: Normal range of motion.  Lymphadenopathy:     Cervical: No cervical adenopathy.  Skin:    General: Skin is warm and dry.     Findings: No erythema or rash.  Neurological:     Mental Status: She is alert and oriented to person, place, and time.  Psychiatric:        Behavior: Behavior normal.        Thought Content: Thought content normal.        Judgment: Judgment normal.      Lab Results  Component Value Date   WBC 6.5 03/24/2020   HGB 11.5 (L) 03/24/2020   HCT 37.0 03/24/2020   MCV 82.6 03/24/2020   PLT 354 03/24/2020     Chemistry      Component Value Date/Time   NA 135 03/24/2020 1202   NA 136 01/07/2020 1256   K 4.0 03/24/2020 1202   CL 101 03/24/2020  1202   CO2 25 03/24/2020 1202   BUN 8 03/24/2020 1202   BUN 10 01/07/2020 1256   CREATININE 0.55 03/24/2020 1202      Component Value Date/Time   CALCIUM 9.5 03/24/2020 1202   ALKPHOS 42 03/24/2020 1202   AST 19 03/24/2020 1202   ALT 19 03/24/2020 1202   BILITOT 0.4 03/24/2020  1202       Impression and Plan: [name] is a very nice 54 year old woman from [place].  She has metastatic pancreatic cancer.  This is clearly aggressive.  She really had no evidence of metastatic disease back in April.  I really thought when I first saw her in May that she will have surgery to resect this.  However, it is clearly progressed outside the confines of surgery.  We will go ahead with her second cycle of treatment.  After this cycle, we will have to see how everything looks on a CT scan.  Of note, her last CA-125 was 1276.  Her last CEA was 58.     Volanda Napoleon, MD 7/6/202112:35 PM

## 2020-03-24 NOTE — Progress Notes (Signed)
Heart rate recheck 102.  Ok to treat with pulse per dr Marin Olp

## 2020-03-25 LAB — CEA (IN HOUSE-CHCC): CEA (CHCC-In House): 32.2 ng/mL — ABNORMAL HIGH (ref 0.00–5.00)

## 2020-03-25 LAB — CA 125: Cancer Antigen (CA) 125: 572 U/mL — ABNORMAL HIGH (ref 0.0–38.1)

## 2020-03-30 ENCOUNTER — Encounter: Payer: Self-pay | Admitting: *Deleted

## 2020-03-30 ENCOUNTER — Ambulatory Visit (HOSPITAL_BASED_OUTPATIENT_CLINIC_OR_DEPARTMENT_OTHER): Payer: 59

## 2020-03-30 ENCOUNTER — Other Ambulatory Visit: Payer: Self-pay

## 2020-03-30 DIAGNOSIS — C252 Malignant neoplasm of tail of pancreas: Secondary | ICD-10-CM

## 2020-03-30 NOTE — Progress Notes (Signed)
Patient ordered CTs for after cycle 2. CTs scheduled for today, however patient still needs day 8 and 15 of cycle 2. Contacted radiology and had CTs rescheduled to 04/21/20, after completion of cycle 2. They have called the patient's daughter and she is aware of rescheduling.  Oncology Nurse Navigator Documentation  Oncology Nurse Navigator Flowsheets 03/30/2020  Confirmed Diagnosis Date -  Diagnosis Status -  Planned Course of Treatment -  Phase of Treatment -  Chemotherapy Pending- Reason: -  Chemotherapy Actual Start Date: -  Navigator Follow Up Date: 04/21/2020  Navigator Follow Up Reason: Chemotherapy;Follow-up Appointment;Radiology  Navigator Location [facility]  Referral Date to RadOnc/MedOnc -  Navigator Encounter Type Appt/Treatment Plan Review  Telephone -  Treatment Initiated Date -  Patient Visit Type MedOnc  Treatment Phase Active Tx  Barriers/Navigation Needs Coordination of Care  Education -  Interventions Coordination of Care  Acuity Level 4-High Needs (Greater Than 4 Barriers Identified)  Referrals -  Coordination of Care Appts;Radiology  Education Method -  Support Groups/Services Friends and Family  Time Spent with Patient 30

## 2020-03-31 ENCOUNTER — Other Ambulatory Visit: Payer: Self-pay

## 2020-03-31 ENCOUNTER — Ambulatory Visit: Payer: Self-pay | Admitting: Family Medicine

## 2020-03-31 ENCOUNTER — Inpatient Hospital Stay: Payer: 59

## 2020-03-31 ENCOUNTER — Encounter: Payer: Self-pay | Admitting: *Deleted

## 2020-03-31 VITALS — BP 96/69 | HR 98 | Temp 98.0°F | Resp 18

## 2020-03-31 DIAGNOSIS — C252 Malignant neoplasm of tail of pancreas: Secondary | ICD-10-CM

## 2020-03-31 LAB — CBC WITH DIFFERENTIAL (CANCER CENTER ONLY)
Abs Immature Granulocytes: 0.07 10*3/uL (ref 0.00–0.07)
Basophils Absolute: 0 10*3/uL (ref 0.0–0.1)
Basophils Relative: 1 %
Eosinophils Absolute: 0.2 10*3/uL (ref 0.0–0.5)
Eosinophils Relative: 4 %
HCT: 34.1 % — ABNORMAL LOW (ref 36.0–46.0)
Hemoglobin: 10.6 g/dL — ABNORMAL LOW (ref 12.0–15.0)
Immature Granulocytes: 2 %
Lymphocytes Relative: 33 %
Lymphs Abs: 1.6 10*3/uL (ref 0.7–4.0)
MCH: 25.8 pg — ABNORMAL LOW (ref 26.0–34.0)
MCHC: 31.1 g/dL (ref 30.0–36.0)
MCV: 83 fL (ref 80.0–100.0)
Monocytes Absolute: 0.4 10*3/uL (ref 0.1–1.0)
Monocytes Relative: 8 %
Neutro Abs: 2.5 10*3/uL (ref 1.7–7.7)
Neutrophils Relative %: 52 %
Platelet Count: 210 10*3/uL (ref 150–400)
RBC: 4.11 MIL/uL (ref 3.87–5.11)
RDW: 15.8 % — ABNORMAL HIGH (ref 11.5–15.5)
WBC Count: 4.7 10*3/uL (ref 4.0–10.5)
nRBC: 0 % (ref 0.0–0.2)

## 2020-03-31 LAB — CMP (CANCER CENTER ONLY)
ALT: 27 U/L (ref 0–44)
AST: 22 U/L (ref 15–41)
Albumin: 4.1 g/dL (ref 3.5–5.0)
Alkaline Phosphatase: 42 U/L (ref 38–126)
Anion gap: 10 (ref 5–15)
BUN: 10 mg/dL (ref 6–20)
CO2: 24 mmol/L (ref 22–32)
Calcium: 9.5 mg/dL (ref 8.9–10.3)
Chloride: 100 mmol/L (ref 98–111)
Creatinine: 0.54 mg/dL (ref 0.44–1.00)
GFR, Est AFR Am: >60 mL/min (ref >60)
GFR, Estimated: >60 mL/min (ref >60)
Glucose, Bld: 204 mg/dL — ABNORMAL HIGH (ref 70–99)
Potassium: 3.8 mmol/L (ref 3.5–5.1)
Sodium: 134 mmol/L — ABNORMAL LOW (ref 135–145)
Total Bilirubin: 0.4 mg/dL (ref 0.3–1.2)
Total Protein: 7.6 g/dL (ref 6.5–8.1)

## 2020-03-31 MED ORDER — PROCHLORPERAZINE MALEATE 10 MG PO TABS
ORAL_TABLET | ORAL | Status: AC
  Filled 2020-03-31: qty 1

## 2020-03-31 MED ORDER — SODIUM CHLORIDE 0.9 % IV SOLN
Freq: Once | INTRAVENOUS | Status: AC
  Filled 2020-03-31: qty 250

## 2020-03-31 MED ORDER — HYDROMORPHONE HCL 1 MG/ML IJ SOLN
INTRAMUSCULAR | Status: AC
  Filled 2020-03-31: qty 2

## 2020-03-31 MED ORDER — PROCHLORPERAZINE MALEATE 10 MG PO TABS
10.0000 mg | ORAL_TABLET | Freq: Once | ORAL | Status: AC
  Administered 2020-03-31: 10 mg via ORAL

## 2020-03-31 MED ORDER — HYDROMORPHONE HCL 1 MG/ML IJ SOLN
2.0000 mg | Freq: Once | INTRAMUSCULAR | Status: AC
  Administered 2020-03-31: 2 mg via INTRAVENOUS

## 2020-03-31 MED ORDER — SODIUM CHLORIDE 0.9% FLUSH
10.0000 mL | INTRAVENOUS | Status: DC | PRN
  Administered 2020-03-31: 10 mL
  Filled 2020-03-31: qty 10

## 2020-03-31 MED ORDER — SODIUM CHLORIDE 0.9 % IV SOLN
2000.0000 mg | Freq: Once | INTRAVENOUS | Status: AC
  Administered 2020-03-31: 2000 mg via INTRAVENOUS
  Filled 2020-03-31: qty 52.6

## 2020-03-31 MED ORDER — PACLITAXEL PROTEIN-BOUND CHEMO INJECTION 100 MG
100.0000 mg/m2 | Freq: Once | INTRAVENOUS | Status: AC
  Administered 2020-03-31: 200 mg via INTRAVENOUS
  Filled 2020-03-31: qty 40

## 2020-03-31 MED ORDER — HEPARIN SOD (PORK) LOCK FLUSH 100 UNIT/ML IV SOLN
500.0000 [IU] | Freq: Once | INTRAVENOUS | Status: AC | PRN
  Administered 2020-03-31: 500 [IU]
  Filled 2020-03-31: qty 5

## 2020-03-31 NOTE — Patient Instructions (Signed)
[facility] Discharge Instructions for Patients Receiving Chemotherapy  Today you received the following chemotherapy agents Gemzar, Abraxane  To help prevent nausea and vomiting after your treatment, we encourage you to take your nausea medication    If you develop nausea and vomiting that is not controlled by your nausea medication, call the clinic.   BELOW ARE SYMPTOMS THAT SHOULD BE REPORTED IMMEDIATELY:  *FEVER GREATER THAN 100.5 F  *CHILLS WITH OR WITHOUT FEVER  NAUSEA AND VOMITING THAT IS NOT CONTROLLED WITH YOUR NAUSEA MEDICATION  *UNUSUAL SHORTNESS OF BREATH  *UNUSUAL BRUISING OR BLEEDING  TENDERNESS IN MOUTH AND THROAT WITH OR WITHOUT PRESENCE OF ULCERS  *URINARY PROBLEMS  *BOWEL PROBLEMS  UNUSUAL RASH Items with * indicate a potential emergency and should be followed up as soon as possible.  Feel free to call the clinic should you have any questions or concerns. The clinic phone number is [phone].  Please show the CHEMO ALERT CARD at check-in to the Emergency Department and triage nurse.   

## 2020-03-31 NOTE — Patient Instructions (Signed)
Implanted Port Insertion, Care After °This sheet gives you information about how to care for yourself after your procedure. Your health care provider may also give you more specific instructions. If you have problems or questions, contact your health care provider. °What can I expect after the procedure? °After the procedure, it is common to have: °· Discomfort at the port insertion site. °· Bruising on the skin over the port. This should improve over 3-4 days. °Follow these instructions at home: °Port care °· After your port is placed, you will get a manufacturer's information card. The card has information about your port. Keep this card with you at all times. °· Take care of the port as told by your health care provider. Ask your health care provider if you or a family member can get training for taking care of the port at home. A home health care nurse may also take care of the port. °· Make sure to remember what type of port you have. °Incision care ° °  ° °· Follow instructions from your health care provider about how to take care of your port insertion site. Make sure you: °? Wash your hands with soap and water before and after you change your bandage (dressing). If soap and water are not available, use hand sanitizer. °? Change your dressing as told by your health care provider. °? Leave stitches (sutures), skin glue, or adhesive strips in place. These skin closures may need to stay in place for 2 weeks or longer. If adhesive strip edges start to loosen and curl up, you may trim the loose edges. Do not remove adhesive strips completely unless your health care provider tells you to do that. °· Check your port insertion site every day for signs of infection. Check for: °? Redness, swelling, or pain. °? Fluid or blood. °? Warmth. °? Pus or a bad smell. °Activity °· Return to your normal activities as told by your health care provider. Ask your health care provider what activities are safe for you. °· Do not  lift anything that is heavier than 10 lb (4.5 kg), or the limit that you are told, until your health care provider says that it is safe. °General instructions °· Take over-the-counter and prescription medicines only as told by your health care provider. °· Do not take baths, swim, or use a hot tub until your health care provider approves. Ask your health care provider if you may take showers. You may only be allowed to take sponge baths. °· Do not drive for 24 hours if you were given a sedative during your procedure. °· Wear a medical alert bracelet in case of an emergency. This will tell any health care providers that you have a port. °· Keep all follow-up visits as told by your health care provider. This is important. °Contact a health care provider if: °· You cannot flush your port with saline as directed, or you cannot draw blood from the port. °· You have a fever or chills. °· You have redness, swelling, or pain around your port insertion site. °· You have fluid or blood coming from your port insertion site. °· Your port insertion site feels warm to the touch. °· You have pus or a bad smell coming from the port insertion site. °Get help right away if: °· You have chest pain or shortness of breath. °· You have bleeding from your port that you cannot control. °Summary °· Take care of the port as told by your health   care provider. Keep the manufacturer's information card with you at all times. °· Change your dressing as told by your health care provider. °· Contact a health care provider if you have a fever or chills or if you have redness, swelling, or pain around your port insertion site. °· Keep all follow-up visits as told by your health care provider. °This information is not intended to replace advice given to you by your health care provider. Make sure you discuss any questions you have with your health care provider. °Document Revised: 04/03/2018 Document Reviewed: 04/03/2018 °Elsevier Patient Education ©  2020 [employer] ° °

## 2020-03-31 NOTE — Progress Notes (Signed)
At 11:20 pt. Ask for pain medication. Laverna Peace, NP informed. Order for Dilaudid 2 mg placed.

## 2020-03-31 NOTE — Progress Notes (Signed)
Letter for patient composed and sent as requested through My Chart communication. Patient's daughter, Rehab, also requests the patient's genetic appointment be rescheduled due to conflict with religious holiday. Spoke to scheduling and they will reschedule this appointment.   Oncology Nurse Navigator Documentation  Oncology Nurse Navigator Flowsheets 03/31/2020  Confirmed Diagnosis Date -  Diagnosis Status -  Planned Course of Treatment -  Phase of Treatment -  Chemotherapy Pending- Reason: -  Chemotherapy Actual Start Date: -  Navigator Follow Up Date: 04/21/2020  Navigator Follow Up Reason: Chemotherapy;Follow-up Appointment  Navigator Location [facility]  Referral Date to RadOnc/MedOnc -  Navigator Encounter Type Treatment;Letter/Fax/Email  Telephone -  Treatment Initiated Date -  Patient Visit Type MedOnc  Treatment Phase Active Tx  Barriers/Navigation Needs Coordination of Care;Cultural Needs;Family Concerns;Language/Communication  Education -  Interventions Coordination of Care;Other  Acuity Level 4-High Needs (Greater Than 4 Barriers Identified)  Referrals -  Coordination of Care Appts;Other  Education Method -  Support Groups/Services Friends and Family  Time Spent with Patient 57

## 2020-04-06 ENCOUNTER — Telehealth (INDEPENDENT_AMBULATORY_CARE_PROVIDER_SITE_OTHER): Payer: 59 | Admitting: Family Medicine

## 2020-04-06 ENCOUNTER — Other Ambulatory Visit: Payer: Self-pay

## 2020-04-06 ENCOUNTER — Encounter: Payer: Self-pay | Admitting: Family Medicine

## 2020-04-06 DIAGNOSIS — I10 Essential (primary) hypertension: Secondary | ICD-10-CM | POA: Diagnosis not present

## 2020-04-06 DIAGNOSIS — C252 Malignant neoplasm of tail of pancreas: Secondary | ICD-10-CM | POA: Diagnosis not present

## 2020-04-06 DIAGNOSIS — K219 Gastro-esophageal reflux disease without esophagitis: Secondary | ICD-10-CM

## 2020-04-06 DIAGNOSIS — E11649 Type 2 diabetes mellitus with hypoglycemia without coma: Secondary | ICD-10-CM

## 2020-04-06 MED ORDER — LISINOPRIL 10 MG PO TABS: 10.0000 mg | ORAL_TABLET | Freq: Every day | ORAL | 3 refills | Status: DC

## 2020-04-06 MED ORDER — AMLODIPINE BESYLATE 10 MG PO TABS: 10.0000 mg | ORAL_TABLET | Freq: Every day | ORAL | 3 refills | Status: DC

## 2020-04-06 MED ORDER — METFORMIN HCL ER 500 MG PO TB24: 500.0000 mg | ORAL_TABLET | Freq: Every day | ORAL | 3 refills | Status: DC

## 2020-04-06 MED ORDER — OMEPRAZOLE 40 MG PO CPDR: 40.0000 mg | DELAYED_RELEASE_CAPSULE | Freq: Every day | ORAL | 3 refills | Status: DC

## 2020-04-06 MED ORDER — CETIRIZINE HCL 10 MG PO TABS: 10.0000 mg | ORAL_TABLET | Freq: Every day | ORAL | 3 refills | Status: AC

## 2020-04-06 NOTE — Progress Notes (Signed)
Virtual Visit Note  I connected with patient on 04/06/20 at 423pm by phone (due to technical difficulty) and verified that I am speaking with the correct person using two identifiers. Angela Wilson is currently located at home and patient is currently with them during visit. The provider, Angela Guys, MD is located in their office at time of visit.  I discussed the limitations, risks, security and privacy concerns of performing an evaluation and management service by telephone and the availability of in person appointments. I also discussed with the patient that there may be a patient responsible charge related to this service. The patient expressed understanding and agreed to proceed.   I provided 21 minutes of non-face-to-face time during this encounter.  Chief Complaint  Patient presents with  . Diabetes    med refills  . Hypertension    med refills   . Pancreatic Cancer    6th week of chemotherapy    HPI ? PMH: DM2, HTN, metastatic pancreatic cancer Last OV April 2021 Angela Clinton NP Last Onc March 24 2020 - chemo, started marinol for weight loss, mild diarrhea  Her daughter serves as interpreter Her appetite is slight better than before now that she is on marinol She reports that her cbgs 2 weeks ago her low in the 60s during days following chemo treatment Currently her glucose 180s Currently tolerating chemo a lot better BP at home /chemo has been doing well other than towards the beginning She is requesting refill of omeprazole which helps with her reflux Daughter has noticed cough after chemo, resolves She does have a right sided pleural effusion No sob or chest pain No fever or chills  Lab Results  Component Value Date   HGBA1C 8.6 (H) 02/16/2020   HGBA1C 9.6 (A) 01/07/2020   HGBA1C 7.4 (H) 11/07/2012   Lab Results  Component Value Date   LDLCALC (H) 11/14/2009    100        Total Cholesterol/HDL:CHD Risk Coronary Heart Disease Risk Table                      Men   Women  1/2 Average Risk   3.4   3.3  Average Risk       5.0   4.4  2 X Average Risk   9.6   7.1  3 X Average Risk  23.4   11.0        Use the calculated Patient Ratio above and the CHD Risk Table to determine the patient's CHD Risk.        ATP III CLASSIFICATION (LDL):  <100     mg/dL   Optimal  100-129  mg/dL   Near or Above                    Optimal  130-159  mg/dL   Borderline  160-189  mg/dL   High  >190     mg/dL   Very High   CREATININE 0.54 03/31/2020   Lab Results  Component Value Date   ALT 27 03/31/2020   AST 22 03/31/2020   ALKPHOS 42 03/31/2020   BILITOT 0.4 03/31/2020    BP Readings from Last 3 Encounters:  03/31/20 96/69  03/24/20 132/62  03/10/20 (!) 154/81   Wt Readings from Last 3 Encounters:  03/24/20 212 lb (96.2 kg)  03/10/20 223 lb (101.2 kg)  03/10/20 223 lb (101.2 kg)    Allergies  Allergen Reactions  .  Iodinated Diagnostic Agents Itching and Rash  . Ivp Dye [Iodinated Diagnostic Agents] Itching and Rash  . Morphine And Related Hives  . Penicillins Rash    Prior to Admission medications   Medication Sig Start Date End Date Taking? Authorizing Provider  albuterol (PROVENTIL HFA;VENTOLIN HFA) 108 (90 Base) MCG/ACT inhaler Inhale 2 puffs into the lungs every 4 (four) hours as needed for wheezing or shortness of breath. 04/01/16  Yes Mabe, Shanon Brow, NP  amLODipine (NORVASC) 10 MG tablet Take 10 mg by mouth daily. 12/27/19  Yes [provider]  cetirizine (ZYRTEC) 10 MG tablet Take 10 mg by mouth daily.   Yes [provider]  CREON 78295-621308 units CPEP capsule  03/01/20  Yes [provider]  dronabinol (MARINOL) 2.5 MG capsule Take 1 capsule (2.5 mg total) by mouth 2 (two) times daily before lunch and supper. 03/24/20  Yes Angela Napoleon, MD  fentaNYL (DURAGESIC) 50 MCG/HR Place 1 patch onto the skin every 3 (three) days. 02/25/20  Yes Ennever, Rudell Cobb, MD  glipiZIDE (GLUCOTROL) 10 MG tablet Take 10 mg by mouth 2  (two) times daily. 08/25/19  Yes [provider]  lidocaine-prilocaine (EMLA) cream APPLY TOPICALLY ONCE FOR 1 DOSE 02/25/20  Yes [provider]  lisinopril (ZESTRIL) 10 MG tablet Take 10 mg by mouth daily. 12/27/19  Yes [provider]  metFORMIN (GLUCOPHAGE XR) 500 MG 24 hr tablet Take 1 tablet (500 mg total) by mouth daily with breakfast. 11/09/12  Yes Johnson, Clanford L, MD  metoCLOPramide (REGLAN) 5 MG tablet Take 1 tablet (5 mg total) by mouth 4 (four) times daily -  before meals and at bedtime. 01/07/20 04/06/20 Yes Angela Mola, NP  omeprazole (PRILOSEC) 40 MG capsule Take 40 mg by mouth daily. 08/20/19  Yes [provider]  ondansetron (ZOFRAN) 4 MG tablet Take 1 tablet (4 mg total) by mouth every 6 (six) hours as needed for nausea. 02/26/20  Yes Angela Napoleon, MD  oxyCODONE 10 MG TABS Take 1 tablet (10 mg total) by mouth every 6 (six) hours as needed for severe pain. 02/25/20  Yes Angela Napoleon, MD  aspirin 81 MG chewable tablet Chew 81 mg by mouth daily. Patient not taking: Reported on 04/06/2020    [provider]  ergocalciferol (VITAMIN D2) 1.25 MG (50000 UT) capsule Take 50,000 Units by mouth once a week.  08/25/19   [provider]  HYDROcodone-homatropine (HYCODAN) 5-1.5 MG/5ML syrup Take 5 mLs by mouth every 6 (six) hours as needed for cough. Patient not taking: Reported on 04/06/2020 03/18/20   Angela Napoleon, MD  polyethylene glycol (MIRALAX / GLYCOLAX) 17 g packet Take 17 g by mouth daily as needed. Patient not taking: Reported on 02/25/2020 02/18/20   Angela Friendly, MD  pravastatin (PRAVACHOL) 40 MG tablet Take 40 mg by mouth daily. 12/27/19   [provider]  prochlorperazine (COMPAZINE) 10 MG tablet Take 1 tablet (10 mg total) by mouth every 6 (six) hours as needed for nausea or vomiting. 03/10/20   Angela Napoleon, MD    Past Medical History:  Diagnosis Date  . Diabetes mellitus without complication ([facility])     . GERD (gastroesophageal reflux disease)   . Hyperlipidemia   . Hypertension   . Kidney stone   . Pancreatic cancer ([facility]) 02/03/2020  . Shingles   . Type 2 diabetes mellitus ([facility])     Past Surgical History:  Procedure Laterality Date  . CESAREAN SECTION  x 4  . IR IMAGING GUIDED PORT INSERTION  02/18/2020  . KIDNEY STONE SURGERY      Social History   Tobacco Use  . Smoking status: Never Smoker  . Smokeless tobacco: Never Used  Substance Use Topics  . Alcohol use: No    No family history on file.  ROS Per hpi  Objective  Vitals as reported by the patient: none   ASSESSMENT and PLAN  1. Type 2 diabetes mellitus with hypoglycemia without coma, without long-term current use of insulin (HCC) a1c acceptable at this time. Risk of hypoglycemia more concerning, discussed holding metformin until after chemo and evaluating level of vomiting/fluid loss/anorexia. Discussed mgt of hypoglycemia  2. Essential hypertension, benign Last BP during chemo was < 100/60. Discussed holding BP meds until after chemo and evaluating level of vomiting/fluid loss/anorexia.   3. Malignant neoplasm of tail of pancreas (HCC) Metastatic, currently undergoing chemo, under onc care  4. Gastroesophageal reflux disease without esophagitis Stable on omeprazole  Other orders - cetirizine (ZYRTEC) 10 MG tablet; Take 1 tablet (10 mg total) by mouth daily. - lisinopril (ZESTRIL) 10 MG tablet; Take 1 tablet (10 mg total) by mouth daily. - metFORMIN (GLUCOPHAGE XR) 500 MG 24 hr tablet; Take 1 tablet (500 mg total) by mouth daily with breakfast. - omeprazole (PRILOSEC) 40 MG capsule; Take 1 capsule (40 mg total) by mouth daily. - amLODipine (NORVASC) 10 MG tablet; Take 1 tablet (10 mg total) by mouth daily.  FOLLOW-UP: 3 months   The above assessment and management plan was discussed with the patient. The patient verbalized understanding of and has agreed to the management plan. Patient is aware to  call the clinic if symptoms persist or worsen. Patient is aware when to return to the clinic for a follow-up visit. Patient educated on when it is appropriate to go to the emergency department.     Angela Guys, MD [facility] [place], [place] [address] Ph.  [phone] Fax [phone]

## 2020-04-06 NOTE — Patient Instructions (Signed)
° ° ° °  If you have lab work done today you will be contacted with your lab results within the next 2 weeks.  If you have not heard from us then please contact us. The fastest way to get your results is to register for My Chart. ° ° °IF you received an x-ray today, you will receive an invoice from [facility]. Please contact [facility] at [phone] with questions or concerns regarding your invoice.  ° °IF you received labwork today, you will receive an invoice from [facility]. Please contact [facility] at [phone] with questions or concerns regarding your invoice.  ° °Our billing staff will not be able to assist you with questions regarding bills from these companies. ° °You will be contacted with the lab results as soon as they are available. The fastest way to get your results is to activate your My Chart account. Instructions are located on the last page of this paperwork. If you have not heard from us regarding the results in 2 weeks, please contact this office. °  ° ° ° °

## 2020-04-07 ENCOUNTER — Other Ambulatory Visit: Payer: 59

## 2020-04-07 ENCOUNTER — Inpatient Hospital Stay: Payer: 59 | Admitting: Genetic Counselor

## 2020-04-08 ENCOUNTER — Other Ambulatory Visit: Payer: Self-pay

## 2020-04-08 ENCOUNTER — Ambulatory Visit (HOSPITAL_BASED_OUTPATIENT_CLINIC_OR_DEPARTMENT_OTHER)
Admission: RE | Admit: 2020-04-08 | Discharge: 2020-04-08 | Disposition: A | Discharge status: Home / Self Care | Payer: 59 | Source: Ambulatory Visit | Attending: Family | Admitting: Family

## 2020-04-08 ENCOUNTER — Inpatient Hospital Stay: Payer: 59

## 2020-04-08 ENCOUNTER — Encounter: Payer: Self-pay | Admitting: *Deleted

## 2020-04-08 ENCOUNTER — Other Ambulatory Visit: Payer: Self-pay | Admitting: Family

## 2020-04-08 DIAGNOSIS — R059 Cough, unspecified: Secondary | ICD-10-CM

## 2020-04-08 DIAGNOSIS — R05 Cough: Secondary | ICD-10-CM | POA: Insufficient documentation

## 2020-04-08 DIAGNOSIS — C252 Malignant neoplasm of tail of pancreas: Secondary | ICD-10-CM

## 2020-04-08 LAB — CBC WITH DIFFERENTIAL (CANCER CENTER ONLY)
Abs Immature Granulocytes: 0.06 10*3/uL (ref 0.00–0.07)
Basophils Absolute: 0 10*3/uL (ref 0.0–0.1)
Basophils Relative: 1 %
Eosinophils Absolute: 0.3 10*3/uL (ref 0.0–0.5)
Eosinophils Relative: 4 %
HCT: 35.9 % — ABNORMAL LOW (ref 36.0–46.0)
Hemoglobin: 11.2 g/dL — ABNORMAL LOW (ref 12.0–15.0)
Immature Granulocytes: 1 %
Lymphocytes Relative: 24 %
Lymphs Abs: 1.6 10*3/uL (ref 0.7–4.0)
MCH: 25.6 pg — ABNORMAL LOW (ref 26.0–34.0)
MCHC: 31.2 g/dL (ref 30.0–36.0)
MCV: 82.2 fL (ref 80.0–100.0)
Monocytes Absolute: 0.5 10*3/uL (ref 0.1–1.0)
Monocytes Relative: 8 %
Neutro Abs: 4 10*3/uL (ref 1.7–7.7)
Neutrophils Relative %: 62 %
Platelet Count: 168 10*3/uL (ref 150–400)
RBC: 4.37 MIL/uL (ref 3.87–5.11)
RDW: 17.2 % — ABNORMAL HIGH (ref 11.5–15.5)
WBC Count: 6.4 10*3/uL (ref 4.0–10.5)
nRBC: 0 % (ref 0.0–0.2)

## 2020-04-08 LAB — CMP (CANCER CENTER ONLY)
ALT: 22 U/L (ref 0–44)
AST: 17 U/L (ref 15–41)
Albumin: 4.4 g/dL (ref 3.5–5.0)
Alkaline Phosphatase: 43 U/L (ref 38–126)
Anion gap: 9 (ref 5–15)
BUN: 11 mg/dL (ref 6–20)
CO2: 25 mmol/L (ref 22–32)
Calcium: 9.9 mg/dL (ref 8.9–10.3)
Chloride: 100 mmol/L (ref 98–111)
Creatinine: 0.56 mg/dL (ref 0.44–1.00)
GFR, Est AFR Am: >60 mL/min (ref >60)
GFR, Estimated: >60 mL/min (ref >60)
Glucose, Bld: 192 mg/dL — ABNORMAL HIGH (ref 70–99)
Potassium: 4 mmol/L (ref 3.5–5.1)
Sodium: 134 mmol/L — ABNORMAL LOW (ref 135–145)
Total Bilirubin: 0.5 mg/dL (ref 0.3–1.2)
Total Protein: 8.4 g/dL — ABNORMAL HIGH (ref 6.5–8.1)

## 2020-04-08 LAB — CEA (IN HOUSE-CHCC): CEA (CHCC-In House): 15.2 ng/mL — ABNORMAL HIGH (ref 0.00–5.00)

## 2020-04-08 MED ORDER — SODIUM CHLORIDE 0.9 % IV SOLN
Freq: Once | INTRAVENOUS | Status: AC
  Filled 2020-04-08: qty 250

## 2020-04-08 MED ORDER — PROCHLORPERAZINE MALEATE 10 MG PO TABS
10.0000 mg | ORAL_TABLET | Freq: Once | ORAL | Status: AC
  Administered 2020-04-08: 10 mg via ORAL

## 2020-04-08 MED ORDER — HEPARIN SOD (PORK) LOCK FLUSH 100 UNIT/ML IV SOLN
500.0000 [IU] | Freq: Once | INTRAVENOUS | Status: DC | PRN
  Filled 2020-04-08: qty 5

## 2020-04-08 MED ORDER — PACLITAXEL PROTEIN-BOUND CHEMO INJECTION 100 MG
100.0000 mg/m2 | Freq: Once | INTRAVENOUS | Status: AC
  Administered 2020-04-08: 200 mg via INTRAVENOUS
  Filled 2020-04-08: qty 40

## 2020-04-08 MED ORDER — PROCHLORPERAZINE MALEATE 10 MG PO TABS
ORAL_TABLET | ORAL | Status: AC
  Filled 2020-04-08: qty 1

## 2020-04-08 MED ORDER — HYDROCODONE-HOMATROPINE 5-1.5 MG/5ML PO SYRP: 5.0000 mL | ORAL_SOLUTION | Freq: Four times a day (QID) | ORAL | 0 refills | Status: DC | PRN

## 2020-04-08 MED ORDER — SODIUM CHLORIDE 0.9 % IV SOLN
Freq: Once | INTRAVENOUS | Status: DC
  Filled 2020-04-08: qty 250

## 2020-04-08 MED ORDER — HYDROMORPHONE HCL 1 MG/ML IJ SOLN
INTRAMUSCULAR | Status: AC
  Filled 2020-04-08: qty 1

## 2020-04-08 MED ORDER — HYDROMORPHONE HCL 1 MG/ML IJ SOLN
1.0000 mg | Freq: Once | INTRAMUSCULAR | Status: AC
  Administered 2020-04-08: 1 mg via INTRAVENOUS

## 2020-04-08 MED ORDER — SODIUM CHLORIDE 0.9 % IV SOLN
2000.0000 mg | Freq: Once | INTRAVENOUS | Status: AC
  Administered 2020-04-08: 2000 mg via INTRAVENOUS
  Filled 2020-04-08: qty 52.6

## 2020-04-08 MED ORDER — SODIUM CHLORIDE 0.9% FLUSH
10.0000 mL | INTRAVENOUS | Status: DC | PRN
  Filled 2020-04-08: qty 10

## 2020-04-08 NOTE — Progress Notes (Signed)
Elevated HR reviewed with MD, ok to proceed with treatment. HR 102 recheck manual.

## 2020-04-08 NOTE — Patient Instructions (Signed)
[facility] Discharge Instructions for Patients Receiving Chemotherapy  Today you received the following chemotherapy agents Gemzar, Abraxane  To help prevent nausea and vomiting after your treatment, we encourage you to take your nausea medication    If you develop nausea and vomiting that is not controlled by your nausea medication, call the clinic.   BELOW ARE SYMPTOMS THAT SHOULD BE REPORTED IMMEDIATELY:  *FEVER GREATER THAN 100.5 F  *CHILLS WITH OR WITHOUT FEVER  NAUSEA AND VOMITING THAT IS NOT CONTROLLED WITH YOUR NAUSEA MEDICATION  *UNUSUAL SHORTNESS OF BREATH  *UNUSUAL BRUISING OR BLEEDING  TENDERNESS IN MOUTH AND THROAT WITH OR WITHOUT PRESENCE OF ULCERS  *URINARY PROBLEMS  *BOWEL PROBLEMS  UNUSUAL RASH Items with * indicate a potential emergency and should be followed up as soon as possible.  Feel free to call the clinic should you have any questions or concerns. The clinic phone number is [phone].  Please show the CHEMO ALERT CARD at check-in to the Emergency Department and triage nurse.   

## 2020-04-08 NOTE — Progress Notes (Signed)
Patient states she has had a cough since her last treatment, no complaints of SOB or chest pains. Cough has improved since then but was asking to see Dr.Ennever today to discuss.  Information sent to charge RN

## 2020-04-08 NOTE — Progress Notes (Signed)
Visited with patient and daughter in treatment. Patient is doing well but has a cough that has been worse lately. Patient thinks it might just be allergies but would like the MD aware. CXR will be ordered by NP.  Patient, and daughter, Rehab, are frustrated because the patient's other daughter's medical VISA request was denied. She is going to try to appeal but this will delay any possibility of her getting here to help with her mom. Another child, a son, has his appointment next week to attempt his medical VISA.   Oncology Nurse Navigator Documentation  Oncology Nurse Navigator Flowsheets 04/08/2020  Confirmed Diagnosis Date -  Diagnosis Status -  Planned Course of Treatment -  Phase of Treatment -  Chemotherapy Pending- Reason: -  Chemotherapy Actual Start Date: -  Navigator Follow Up Date: 04/21/2020  Navigator Follow Up Reason: Follow-up Appointment;Chemotherapy  Navigator Location [facility]  Referral Date to RadOnc/MedOnc -  Navigator Encounter Type Treatment  Telephone -  Treatment Initiated Date -  Patient Visit Type MedOnc  Treatment Phase Active Tx  Barriers/Navigation Needs Coordination of Care;Cultural Needs;Family Concerns;Language/Communication  Education -  Interventions Psycho-Social Support  Acuity Level 4-High Needs (Greater Than 4 Barriers Identified)  Referrals -  Coordination of Care -  Education Method -  Support Groups/Services Friends and Family  Time Spent with Patient 30

## 2020-04-09 LAB — CA 125: Cancer Antigen (CA) 125: 218 U/mL — ABNORMAL HIGH (ref 0.0–38.1)

## 2020-04-10 ENCOUNTER — Encounter: Payer: Self-pay | Admitting: *Deleted

## 2020-04-14 ENCOUNTER — Encounter: Payer: Self-pay | Admitting: *Deleted

## 2020-04-20 ENCOUNTER — Other Ambulatory Visit: Payer: Self-pay | Admitting: *Deleted

## 2020-04-20 DIAGNOSIS — C252 Malignant neoplasm of tail of pancreas: Secondary | ICD-10-CM

## 2020-04-20 DIAGNOSIS — K8689 Other specified diseases of pancreas: Secondary | ICD-10-CM

## 2020-04-21 ENCOUNTER — Inpatient Hospital Stay: Payer: 59

## 2020-04-21 ENCOUNTER — Telehealth: Payer: Self-pay | Admitting: Hematology & Oncology

## 2020-04-21 ENCOUNTER — Inpatient Hospital Stay: Payer: 59 | Attending: Hematology & Oncology | Admitting: Hematology & Oncology

## 2020-04-21 ENCOUNTER — Encounter: Payer: Self-pay | Admitting: Hematology & Oncology

## 2020-04-21 ENCOUNTER — Ambulatory Visit (HOSPITAL_BASED_OUTPATIENT_CLINIC_OR_DEPARTMENT_OTHER)
Admission: RE | Admit: 2020-04-21 | Discharge: 2020-04-21 | Disposition: A | Discharge status: Home / Self Care | Payer: 59 | Source: Ambulatory Visit | Attending: Hematology & Oncology | Admitting: Hematology & Oncology

## 2020-04-21 ENCOUNTER — Other Ambulatory Visit: Payer: Self-pay

## 2020-04-21 VITALS — HR 90

## 2020-04-21 VITALS — BP 110/72 | HR 109 | Temp 98.4°F | Resp 17 | Wt 207.1 lb

## 2020-04-21 DIAGNOSIS — C252 Malignant neoplasm of tail of pancreas: Secondary | ICD-10-CM | POA: Diagnosis present

## 2020-04-21 DIAGNOSIS — Z5111 Encounter for antineoplastic chemotherapy: Secondary | ICD-10-CM | POA: Insufficient documentation

## 2020-04-21 DIAGNOSIS — G893 Neoplasm related pain (acute) (chronic): Secondary | ICD-10-CM | POA: Diagnosis not present

## 2020-04-21 DIAGNOSIS — Z79899 Other long term (current) drug therapy: Secondary | ICD-10-CM | POA: Diagnosis not present

## 2020-04-21 DIAGNOSIS — Z95828 Presence of other vascular implants and grafts: Secondary | ICD-10-CM

## 2020-04-21 DIAGNOSIS — Z7982 Long term (current) use of aspirin: Secondary | ICD-10-CM | POA: Diagnosis not present

## 2020-04-21 DIAGNOSIS — R1084 Generalized abdominal pain: Secondary | ICD-10-CM

## 2020-04-21 DIAGNOSIS — J910 Malignant pleural effusion: Secondary | ICD-10-CM | POA: Diagnosis not present

## 2020-04-21 DIAGNOSIS — R110 Nausea: Secondary | ICD-10-CM | POA: Insufficient documentation

## 2020-04-21 DIAGNOSIS — K8689 Other specified diseases of pancreas: Secondary | ICD-10-CM

## 2020-04-21 LAB — CMP (CANCER CENTER ONLY)
ALT: 15 U/L (ref 0–44)
AST: 16 U/L (ref 15–41)
Albumin: 4 g/dL (ref 3.5–5.0)
Alkaline Phosphatase: 47 U/L (ref 38–126)
Anion gap: 9 (ref 5–15)
BUN: 8 mg/dL (ref 6–20)
CO2: 25 mmol/L (ref 22–32)
Calcium: 9.8 mg/dL (ref 8.9–10.3)
Chloride: 99 mmol/L (ref 98–111)
Creatinine: 0.57 mg/dL (ref 0.44–1.00)
GFR, Est AFR Am: >60 mL/min (ref >60)
GFR, Estimated: >60 mL/min (ref >60)
Glucose, Bld: 109 mg/dL — ABNORMAL HIGH (ref 70–99)
Potassium: 3.8 mmol/L (ref 3.5–5.1)
Sodium: 133 mmol/L — ABNORMAL LOW (ref 135–145)
Total Bilirubin: 0.5 mg/dL (ref 0.3–1.2)
Total Protein: 7.8 g/dL (ref 6.5–8.1)

## 2020-04-21 LAB — CBC WITH DIFFERENTIAL (CANCER CENTER ONLY)
Abs Immature Granulocytes: 0.09 10*3/uL — ABNORMAL HIGH (ref 0.00–0.07)
Basophils Absolute: 0 10*3/uL (ref 0.0–0.1)
Basophils Relative: 0 %
Eosinophils Absolute: 0.2 10*3/uL (ref 0.0–0.5)
Eosinophils Relative: 2 %
HCT: 33.6 % — ABNORMAL LOW (ref 36.0–46.0)
Hemoglobin: 10.7 g/dL — ABNORMAL LOW (ref 12.0–15.0)
Immature Granulocytes: 1 %
Lymphocytes Relative: 21 %
Lymphs Abs: 1.5 10*3/uL (ref 0.7–4.0)
MCH: 26.1 pg (ref 26.0–34.0)
MCHC: 31.8 g/dL (ref 30.0–36.0)
MCV: 82 fL (ref 80.0–100.0)
Monocytes Absolute: 0.8 10*3/uL (ref 0.1–1.0)
Monocytes Relative: 11 %
Neutro Abs: 4.5 10*3/uL (ref 1.7–7.7)
Neutrophils Relative %: 65 %
Platelet Count: 313 10*3/uL (ref 150–400)
RBC: 4.1 MIL/uL (ref 3.87–5.11)
RDW: 17.6 % — ABNORMAL HIGH (ref 11.5–15.5)
WBC Count: 6.9 10*3/uL (ref 4.0–10.5)
nRBC: 0 % (ref 0.0–0.2)

## 2020-04-21 LAB — CEA (IN HOUSE-CHCC): CEA (CHCC-In House): 9.45 ng/mL — ABNORMAL HIGH (ref 0.00–5.00)

## 2020-04-21 MED ORDER — SODIUM CHLORIDE 0.9 % IV SOLN
Freq: Once | INTRAVENOUS | Status: AC
  Filled 2020-04-21: qty 250

## 2020-04-21 MED ORDER — SODIUM CHLORIDE 0.9% FLUSH
10.0000 mL | INTRAVENOUS | Status: DC | PRN
  Administered 2020-04-21: 10 mL
  Filled 2020-04-21: qty 10

## 2020-04-21 MED ORDER — SODIUM CHLORIDE 0.9% FLUSH
10.0000 mL | Freq: Once | INTRAVENOUS | Status: AC
  Administered 2020-04-21: 10 mL via INTRAVENOUS
  Filled 2020-04-21: qty 10

## 2020-04-21 MED ORDER — EPOETIN ALFA-EPBX 40000 UNIT/ML IJ SOLN
INTRAMUSCULAR | Status: AC
  Filled 2020-04-21: qty 1

## 2020-04-21 MED ORDER — HYDROMORPHONE HCL 1 MG/ML IJ SOLN
INTRAMUSCULAR | Status: AC
  Filled 2020-04-21: qty 2

## 2020-04-21 MED ORDER — HYDROMORPHONE HCL 1 MG/ML IJ SOLN
2.0000 mg | Freq: Once | INTRAMUSCULAR | Status: AC | PRN
  Administered 2020-04-21: 2 mg via INTRAVENOUS

## 2020-04-21 MED ORDER — PROCHLORPERAZINE MALEATE 10 MG PO TABS
ORAL_TABLET | ORAL | Status: AC
  Filled 2020-04-21: qty 1

## 2020-04-21 MED ORDER — OXYCODONE HCL 10 MG PO TABS: 10.0000 mg | ORAL_TABLET | Freq: Four times a day (QID) | ORAL | 0 refills | Status: DC | PRN

## 2020-04-21 MED ORDER — PROCHLORPERAZINE MALEATE 10 MG PO TABS
10.0000 mg | ORAL_TABLET | Freq: Once | ORAL | Status: AC
  Administered 2020-04-21: 10 mg via ORAL

## 2020-04-21 MED ORDER — CYANOCOBALAMIN 1000 MCG/ML IJ SOLN
INTRAMUSCULAR | Status: AC
  Filled 2020-04-21: qty 1

## 2020-04-21 MED ORDER — HEPARIN SOD (PORK) LOCK FLUSH 100 UNIT/ML IV SOLN
500.0000 [IU] | Freq: Once | INTRAVENOUS | Status: AC | PRN
  Administered 2020-04-21: 500 [IU]
  Filled 2020-04-21: qty 5

## 2020-04-21 MED ORDER — SODIUM CHLORIDE 0.9 % IV SOLN
2000.0000 mg | Freq: Once | INTRAVENOUS | Status: AC
  Administered 2020-04-21: 2000 mg via INTRAVENOUS
  Filled 2020-04-21: qty 52.6

## 2020-04-21 MED ORDER — PACLITAXEL PROTEIN-BOUND CHEMO INJECTION 100 MG
100.0000 mg/m2 | Freq: Once | INTRAVENOUS | Status: AC
  Administered 2020-04-21: 200 mg via INTRAVENOUS
  Filled 2020-04-21: qty 40

## 2020-04-21 MED ORDER — SODIUM CHLORIDE 0.9 % IV SOLN
Freq: Once | INTRAVENOUS | Status: DC
  Filled 2020-04-21: qty 250

## 2020-04-21 MED ORDER — DARBEPOETIN ALFA 300 MCG/0.6ML IJ SOSY
PREFILLED_SYRINGE | INTRAMUSCULAR | Status: AC
  Filled 2020-04-21: qty 0.6

## 2020-04-21 MED FILL — oxyCODONE HCL 10 MG TABS: 10 | 20 days supply | Qty: 80 | Fill #0

## 2020-04-21 NOTE — Progress Notes (Signed)
Hematology and Oncology Follow Up Visit  Angela Wilson [id number] 1966-03-20 54 y.o. 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 is back for follow-up.  I must say, that she does look quite good.  She really has not taken much in the way of pain medication.  Her last tumor markers were down nicely.  The CEA was down to 15.2.  The CA 125 was 218.  We did go ahead and do a CT scan on her today.  Thankfully, the CT scan showed that she is responding.  She has a stable pancreatic mass measuring 4.2 x 3.3 cm.  The fluid in the right lung is gone down greatly.  The nodularity about the pericolic gutters are is also improved with resolution of ascites.  She has a decrease in omental and peritoneal disease.  I'm just happy that her quality life is doing well right now.  She really wants to go back to [place].  Hopefully, she will be able to do that.  She has had a good better appetite.  She has had no nausea or vomiting.  She had a little bit of diarrhea.  She has had no bleeding.  There is been no fever.  She has had no leg swelling.  Overall, her performance status is ECOG 1.    Medications:  Current Outpatient Medications:  .  albuterol (PROVENTIL HFA;VENTOLIN HFA) 108 (90 Base) MCG/ACT inhaler, Inhale 2 puffs into the lungs every 4 (four) hours as needed for wheezing or shortness of breath., Disp: 1 Inhaler, Rfl: 0 .  amLODipine (NORVASC) 10 MG tablet, Take 1 tablet (10 mg total) by mouth daily., Disp: 30 tablet, Rfl: 3 .  aspirin 81 MG chewable tablet, Chew 81 mg by mouth daily. (Patient not taking: Reported on 04/06/2020), Disp: , Rfl:  .  cetirizine (ZYRTEC) 10 MG tablet, Take 1 tablet (10 mg total) by mouth daily., Disp: 30 tablet, Rfl: 3 .  CREON 36000-114000 units CPEP  capsule, , Disp: , Rfl:  .  dronabinol (MARINOL) 2.5 MG capsule, Take 1 capsule (2.5 mg total) by mouth 2 (two) times daily before lunch and supper., Disp: 60 capsule, Rfl: 0 .  ergocalciferol (VITAMIN D2) 1.25 MG (50000 UT) capsule, Take 50,000 Units by mouth once a week. , Disp: , Rfl:  .  fentaNYL (DURAGESIC) 50 MCG/HR, Place 1 patch onto the skin every 3 (three) days., Disp: 10 patch, Rfl: 0 .  glipiZIDE (GLUCOTROL) 10 MG tablet, Take 10 mg by mouth 2 (two) times daily., Disp: , Rfl:  .  HYDROcodone-homatropine (HYCODAN) 5-1.5 MG/5ML syrup, Take 5 mLs by mouth every 6 (six) hours as needed for cough., Disp: 120 mL, Rfl: 0 .  lidocaine-prilocaine (EMLA) cream, APPLY TOPICALLY ONCE FOR 1 DOSE, Disp: , Rfl:  .  lisinopril (ZESTRIL) 10 MG tablet, Take 1 tablet (10 mg total) by mouth daily., Disp: 30 tablet, Rfl: 3 .  metFORMIN (GLUCOPHAGE XR) 500 MG 24 hr tablet, Take 1 tablet (500 mg total) by mouth daily with breakfast., Disp: 30 tablet, Rfl: 3 .  metoCLOPramide (REGLAN) 5 MG tablet, Take 1 tablet (5 mg total) by mouth 4 (four) times daily -  before meals and at bedtime., Disp: 45 tablet, Rfl: 3 .  omeprazole (PRILOSEC) 40 MG capsule, Take 1 capsule (40  mg total) by mouth daily., Disp: 30 capsule, Rfl: 3 .  ondansetron (ZOFRAN) 4 MG tablet, Take 1 tablet (4 mg total) by mouth every 6 (six) hours as needed for nausea., Disp: 20 tablet, Rfl: 0 .  oxyCODONE 10 MG TABS, Take 1 tablet (10 mg total) by mouth every 6 (six) hours as needed for severe pain., Disp: 90 tablet, Rfl: 0 .  polyethylene glycol (MIRALAX / GLYCOLAX) 17 g packet, Take 17 g by mouth daily as needed. (Patient not taking: Reported on 02/25/2020), Disp: 14 each, Rfl: 0 .  pravastatin (PRAVACHOL) 40 MG tablet, Take 40 mg by mouth daily., Disp: , Rfl:  .  prochlorperazine (COMPAZINE) 10 MG tablet, Take 1 tablet (10 mg total) by mouth every 6 (six) hours as needed for nausea or vomiting., Disp: 90 tablet, Rfl: 3  Allergies:  Allergies    Allergen Reactions  . Iodinated Diagnostic Agents Itching and Rash  . Ivp Dye [Iodinated Diagnostic Agents] Itching and Rash  . Morphine And Related Hives  . Penicillins Rash    Past Medical History, Surgical history, Social history, and Family History were reviewed and updated.  Review of Systems: Review of Systems  Constitutional: Negative.   HENT:  Negative.   Eyes: Negative.   Respiratory: Negative.   Cardiovascular: Negative.   Gastrointestinal: Positive for abdominal pain and nausea.  Endocrine: Negative.   Genitourinary: Negative.    Musculoskeletal: Negative.   Skin: Negative.   Neurological: Negative.   Hematological: Negative.   Psychiatric/Behavioral: Negative.     Physical Exam:  vitals were not taken for this visit.   Wt Readings from Last 3 Encounters:  04/21/20 207 lb 1.3 oz (93.9 kg)  03/24/20 212 lb (96.2 kg)  03/10/20 223 lb (101.2 kg)   Her vital signs show a temperature of 97.1.  Pulse is 100.  Blood pressure 116/70.  Weight is 230 pounds.  Physical Exam Vitals reviewed.  HENT:     Head: Normocephalic and atraumatic.  Eyes:     Pupils: Pupils are equal, round, and reactive to light.  Cardiovascular:     Rate and Rhythm: Normal rate and regular rhythm.     Heart sounds: Normal heart sounds.  Pulmonary:     Effort: Pulmonary effort is normal.     Breath sounds: Normal breath sounds.  Abdominal:     General: Bowel sounds are normal.     Palpations: Abdomen is soft.  Musculoskeletal:        General: No tenderness or deformity. Normal range of motion.     Cervical back: Normal range of motion.  Lymphadenopathy:     Cervical: No cervical adenopathy.  Skin:    General: Skin is warm and dry.     Findings: No erythema or rash.  Neurological:     Mental Status: She is alert and oriented to person, place, and time.  Psychiatric:        Behavior: Behavior normal.        Thought Content: Thought content normal.        Judgment: Judgment normal.       Lab Results  Component Value Date   WBC 6.9 04/21/2020   HGB 10.7 (L) 04/21/2020   HCT 33.6 (L) 04/21/2020   MCV 82.0 04/21/2020   PLT 313 04/21/2020     Chemistry      Component Value Date/Time   NA 133 (L) 04/21/2020 0836   NA 136 01/07/2020 1256   K 3.8 04/21/2020 0836  CL 99 04/21/2020 0836   CO2 25 04/21/2020 0836   BUN 8 04/21/2020 0836   BUN 10 01/07/2020 1256   CREATININE 0.57 04/21/2020 0836      Component Value Date/Time   CALCIUM 9.8 04/21/2020 0836   ALKPHOS 47 04/21/2020 0836   AST 16 04/21/2020 0836   ALT 15 04/21/2020 0836   BILITOT 0.5 04/21/2020 0836       Impression and Plan: [name] is a very nice 54 year old woman from [place].  She has metastatic pancreatic cancer.  This is clearly aggressive.  She really had no evidence of metastatic disease back in April.  I really thought when I first saw her in May that she will have surgery to resect this.  However, it is clearly progressed outside the confines of surgery.  Again, the fact that her CT scan is better and her tumor markers are better are clearly indicative of a response.  Our goal is clearly to get her back to her native country.  She really would like to go back and visit her family and possibly stay there.  We will plan to start her third cycle of treatment now.  I'll see her back in 4 weeks when she starts her fourth cycle of treatment.  I probably would repeat a another CT scan in October.     Volanda Napoleon, MD 8/3/20219:54 AM

## 2020-04-21 NOTE — Telephone Encounter (Signed)
Called LMVM for patient regarding appointments scheduled per 8/3 los

## 2020-04-21 NOTE — Patient Instructions (Signed)
[facility] Discharge Instructions for Patients Receiving Chemotherapy  Today you received the following chemotherapy agents Gemzar, Abraxane  To help prevent nausea and vomiting after your treatment, we encourage you to take your nausea medication    If you develop nausea and vomiting that is not controlled by your nausea medication, call the clinic.   BELOW ARE SYMPTOMS THAT SHOULD BE REPORTED IMMEDIATELY:  *FEVER GREATER THAN 100.5 F  *CHILLS WITH OR WITHOUT FEVER  NAUSEA AND VOMITING THAT IS NOT CONTROLLED WITH YOUR NAUSEA MEDICATION  *UNUSUAL SHORTNESS OF BREATH  *UNUSUAL BRUISING OR BLEEDING  TENDERNESS IN MOUTH AND THROAT WITH OR WITHOUT PRESENCE OF ULCERS  *URINARY PROBLEMS  *BOWEL PROBLEMS  UNUSUAL RASH Items with * indicate a potential emergency and should be followed up as soon as possible.  Feel free to call the clinic should you have any questions or concerns. The clinic phone number is [phone].  Please show the CHEMO ALERT CARD at check-in to the Emergency Department and triage nurse.   

## 2020-04-22 LAB — CA 125: Cancer Antigen (CA) 125: 125 U/mL — ABNORMAL HIGH (ref 0.0–38.1)

## 2020-04-27 ENCOUNTER — Encounter: Payer: Self-pay | Admitting: *Deleted

## 2020-04-27 NOTE — Progress Notes (Signed)
Oncology Nurse Navigator Documentation  Oncology Nurse Navigator Flowsheets 04/27/2020  Confirmed Diagnosis Date -  Diagnosis Status -  Planned Course of Treatment -  Phase of Treatment -  Chemotherapy Pending- Reason: -  Chemotherapy Actual Start Date: -  Navigator Follow Up Date: 04/29/2020  Navigator Follow Up Reason: Chemotherapy  Navigator Location [facility]  Referral Date to RadOnc/MedOnc -  Navigator Encounter Type Appt/Treatment Plan Review  Telephone -  Treatment Initiated Date -  Patient Visit Type MedOnc  Treatment Phase Active Tx  Barriers/Navigation Needs -  Education -  Interventions None Required  Acuity -  Referrals -  Coordination of Care -  Education Method -  Support Groups/Services Friends and Family  Time Spent with Patient 15

## 2020-04-28 ENCOUNTER — Inpatient Hospital Stay: Payer: 59 | Admitting: Genetic Counselor

## 2020-04-28 ENCOUNTER — Other Ambulatory Visit: Payer: Self-pay | Admitting: *Deleted

## 2020-04-28 DIAGNOSIS — C252 Malignant neoplasm of tail of pancreas: Secondary | ICD-10-CM

## 2020-04-28 DIAGNOSIS — K8689 Other specified diseases of pancreas: Secondary | ICD-10-CM

## 2020-04-29 ENCOUNTER — Other Ambulatory Visit: Payer: Self-pay

## 2020-04-29 ENCOUNTER — Inpatient Hospital Stay: Payer: 59

## 2020-04-29 ENCOUNTER — Encounter: Payer: Self-pay | Admitting: *Deleted

## 2020-04-29 VITALS — HR 103

## 2020-04-29 DIAGNOSIS — C252 Malignant neoplasm of tail of pancreas: Secondary | ICD-10-CM

## 2020-04-29 DIAGNOSIS — Z5111 Encounter for antineoplastic chemotherapy: Secondary | ICD-10-CM | POA: Diagnosis not present

## 2020-04-29 DIAGNOSIS — K8689 Other specified diseases of pancreas: Secondary | ICD-10-CM

## 2020-04-29 LAB — CMP (CANCER CENTER ONLY)
ALT: 25 U/L (ref 0–44)
AST: 21 U/L (ref 15–41)
Albumin: 3.9 g/dL (ref 3.5–5.0)
Alkaline Phosphatase: 47 U/L (ref 38–126)
Anion gap: 7 (ref 5–15)
BUN: 11 mg/dL (ref 6–20)
CO2: 26 mmol/L (ref 22–32)
Calcium: 9.7 mg/dL (ref 8.9–10.3)
Chloride: 101 mmol/L (ref 98–111)
Creatinine: 0.57 mg/dL (ref 0.44–1.00)
GFR, Est AFR Am: >60 mL/min (ref >60)
GFR, Estimated: >60 mL/min (ref >60)
Glucose, Bld: 182 mg/dL — ABNORMAL HIGH (ref 70–99)
Potassium: 4.3 mmol/L (ref 3.5–5.1)
Sodium: 134 mmol/L — ABNORMAL LOW (ref 135–145)
Total Bilirubin: 0.3 mg/dL (ref 0.3–1.2)
Total Protein: 7.6 g/dL (ref 6.5–8.1)

## 2020-04-29 LAB — CBC WITH DIFFERENTIAL (CANCER CENTER ONLY)
Abs Immature Granulocytes: 0.27 10*3/uL — ABNORMAL HIGH (ref 0.00–0.07)
Basophils Absolute: 0 10*3/uL (ref 0.0–0.1)
Basophils Relative: 0 %
Eosinophils Absolute: 0.2 10*3/uL (ref 0.0–0.5)
Eosinophils Relative: 2 %
HCT: 34 % — ABNORMAL LOW (ref 36.0–46.0)
Hemoglobin: 10.5 g/dL — ABNORMAL LOW (ref 12.0–15.0)
Immature Granulocytes: 4 %
Lymphocytes Relative: 26 %
Lymphs Abs: 1.9 10*3/uL (ref 0.7–4.0)
MCH: 25.9 pg — ABNORMAL LOW (ref 26.0–34.0)
MCHC: 30.9 g/dL (ref 30.0–36.0)
MCV: 84 fL (ref 80.0–100.0)
Monocytes Absolute: 0.6 10*3/uL (ref 0.1–1.0)
Monocytes Relative: 8 %
Neutro Abs: 4.6 10*3/uL (ref 1.7–7.7)
Neutrophils Relative %: 60 %
Platelet Count: 286 10*3/uL (ref 150–400)
RBC: 4.05 MIL/uL (ref 3.87–5.11)
RDW: 17.6 % — ABNORMAL HIGH (ref 11.5–15.5)
WBC Count: 7.5 10*3/uL (ref 4.0–10.5)
nRBC: 0 % (ref 0.0–0.2)

## 2020-04-29 MED ORDER — HEPARIN SOD (PORK) LOCK FLUSH 100 UNIT/ML IV SOLN
500.0000 [IU] | Freq: Once | INTRAVENOUS | Status: AC | PRN
  Administered 2020-04-29: 500 [IU]
  Filled 2020-04-29: qty 5

## 2020-04-29 MED ORDER — SODIUM CHLORIDE 0.9 % IV SOLN
Freq: Once | INTRAVENOUS | Status: DC
  Filled 2020-04-29: qty 250

## 2020-04-29 MED ORDER — HYDROMORPHONE HCL 1 MG/ML IJ SOLN
2.0000 mg | Freq: Once | INTRAMUSCULAR | Status: AC
  Administered 2020-04-29: 2 mg via INTRAVENOUS

## 2020-04-29 MED ORDER — HYDROMORPHONE HCL 1 MG/ML IJ SOLN
INTRAMUSCULAR | Status: AC
  Filled 2020-04-29: qty 2

## 2020-04-29 MED ORDER — PACLITAXEL PROTEIN-BOUND CHEMO INJECTION 100 MG
100.0000 mg/m2 | Freq: Once | INTRAVENOUS | Status: AC
  Administered 2020-04-29: 200 mg via INTRAVENOUS
  Filled 2020-04-29: qty 40

## 2020-04-29 MED ORDER — SODIUM CHLORIDE 0.9 % IV SOLN
2000.0000 mg | Freq: Once | INTRAVENOUS | Status: AC
  Administered 2020-04-29: 2000 mg via INTRAVENOUS
  Filled 2020-04-29: qty 52.6

## 2020-04-29 MED ORDER — SODIUM CHLORIDE 0.9 % IV SOLN
Freq: Once | INTRAVENOUS | Status: AC
  Filled 2020-04-29: qty 250

## 2020-04-29 MED ORDER — SODIUM CHLORIDE 0.9% FLUSH
10.0000 mL | INTRAVENOUS | Status: DC | PRN
  Administered 2020-04-29: 10 mL
  Filled 2020-04-29: qty 10

## 2020-04-29 MED ORDER — PROCHLORPERAZINE MALEATE 10 MG PO TABS
10.0000 mg | ORAL_TABLET | Freq: Once | ORAL | Status: AC
  Administered 2020-04-29: 10 mg via ORAL

## 2020-04-29 MED ORDER — PROCHLORPERAZINE MALEATE 10 MG PO TABS
ORAL_TABLET | ORAL | Status: AC
  Filled 2020-04-29: qty 1

## 2020-04-29 NOTE — Progress Notes (Signed)
OK to treat with HR 103 per Dr. Marin Olp.

## 2020-04-29 NOTE — Patient Instructions (Signed)
[place] Discharge Instructions for Patients Receiving Chemotherapy  Today you received the following chemotherapy agents Gemzar, Abraxane  To help prevent nausea and vomiting after your treatment, we encourage you to take your nausea medication    If you develop nausea and vomiting that is not controlled by your nausea medication, call the clinic.   BELOW ARE SYMPTOMS THAT SHOULD BE REPORTED IMMEDIATELY:  *FEVER GREATER THAN 100.5 F  *CHILLS WITH OR WITHOUT FEVER  NAUSEA AND VOMITING THAT IS NOT CONTROLLED WITH YOUR NAUSEA MEDICATION  *UNUSUAL SHORTNESS OF BREATH  *UNUSUAL BRUISING OR BLEEDING  TENDERNESS IN MOUTH AND THROAT WITH OR WITHOUT PRESENCE OF ULCERS  *URINARY PROBLEMS  *BOWEL PROBLEMS  UNUSUAL RASH Items with * indicate a potential emergency and should be followed up as soon as possible.  Feel free to call the clinic should you have any questions or concerns. The clinic phone number is [phone].  Please show the [place] at check-in to the Emergency Department and triage nurse.

## 2020-04-29 NOTE — Progress Notes (Signed)
Patient is doing well today. Her pain is continuing to decrease. She is eating and drinking well. Her daughter states the patient is not as active as she'd like and is encouraging her to exercise some throughout the day. Patient's [place] is [place] this [place] and she is very happy. They are still working on having the daughter's VISA approved. They are excited to share that this daughter is also pregnant and due later this year, for patient's sixth grandchild.   Oncology Nurse Navigator Documentation  Oncology Nurse Navigator Flowsheets 04/29/2020  Confirmed Diagnosis Date -  Diagnosis Status -  Planned Course of Treatment -  Phase of Treatment -  Chemotherapy Pending- Reason: -  Chemotherapy Actual Start Date: -  Navigator Follow Up Date: 05/05/2020  Navigator Follow Up Reason: Chemotherapy  Navigator Location [facility]  Referral Date to RadOnc/MedOnc -  Navigator Encounter Type Treatment  Telephone -  Treatment Initiated Date -  Patient Visit Type MedOnc  Treatment Phase Active Tx  Barriers/Navigation Needs Coordination of Care;Cultural Needs;Family Concerns;Language/Communication  Education -  Interventions Psycho-Social Support  Acuity Level 4-High Needs (Greater Than 4 Barriers Identified)  Referrals -  Coordination of Care -  Education Method -  Support Groups/Services Friends and Family  Time Spent with Patient 30

## 2020-05-04 ENCOUNTER — Other Ambulatory Visit: Payer: Self-pay | Admitting: *Deleted

## 2020-05-04 DIAGNOSIS — K8689 Other specified diseases of pancreas: Secondary | ICD-10-CM

## 2020-05-05 ENCOUNTER — Inpatient Hospital Stay: Payer: 59

## 2020-05-05 ENCOUNTER — Other Ambulatory Visit: Payer: Self-pay

## 2020-05-05 ENCOUNTER — Encounter: Payer: Self-pay | Admitting: *Deleted

## 2020-05-05 DIAGNOSIS — Z5111 Encounter for antineoplastic chemotherapy: Secondary | ICD-10-CM | POA: Diagnosis not present

## 2020-05-05 DIAGNOSIS — C252 Malignant neoplasm of tail of pancreas: Secondary | ICD-10-CM

## 2020-05-05 DIAGNOSIS — K8689 Other specified diseases of pancreas: Secondary | ICD-10-CM

## 2020-05-05 LAB — CBC WITH DIFFERENTIAL (CANCER CENTER ONLY)
Abs Immature Granulocytes: 0.06 10*3/uL (ref 0.00–0.07)
Basophils Absolute: 0 10*3/uL (ref 0.0–0.1)
Basophils Relative: 1 %
Eosinophils Absolute: 0.1 10*3/uL (ref 0.0–0.5)
Eosinophils Relative: 2 %
HCT: 32.6 % — ABNORMAL LOW (ref 36.0–46.0)
Hemoglobin: 10.3 g/dL — ABNORMAL LOW (ref 12.0–15.0)
Immature Granulocytes: 1 %
Lymphocytes Relative: 32 %
Lymphs Abs: 1.7 10*3/uL (ref 0.7–4.0)
MCH: 26.5 pg (ref 26.0–34.0)
MCHC: 31.6 g/dL (ref 30.0–36.0)
MCV: 83.8 fL (ref 80.0–100.0)
Monocytes Absolute: 0.2 10*3/uL (ref 0.1–1.0)
Monocytes Relative: 4 %
Neutro Abs: 3.1 10*3/uL (ref 1.7–7.7)
Neutrophils Relative %: 60 %
Platelet Count: 168 10*3/uL (ref 150–400)
RBC: 3.89 MIL/uL (ref 3.87–5.11)
RDW: 17.8 % — ABNORMAL HIGH (ref 11.5–15.5)
WBC Count: 5.2 10*3/uL (ref 4.0–10.5)
nRBC: 0 % (ref 0.0–0.2)

## 2020-05-05 LAB — COMPREHENSIVE METABOLIC PANEL
ALT: 25 U/L (ref 0–44)
AST: 20 U/L (ref 15–41)
Albumin: 3.8 g/dL (ref 3.5–5.0)
Alkaline Phosphatase: 38 U/L (ref 38–126)
Anion gap: 6 (ref 5–15)
BUN: 12 mg/dL (ref 6–20)
CO2: 27 mmol/L (ref 22–32)
Calcium: 9.7 mg/dL (ref 8.9–10.3)
Chloride: 103 mmol/L (ref 98–111)
Creatinine, Ser: 0.58 mg/dL (ref 0.44–1.00)
GFR calc Af Amer: >60 mL/min (ref >60)
GFR calc non Af Amer: >60 mL/min (ref >60)
Glucose, Bld: 160 mg/dL — ABNORMAL HIGH (ref 70–99)
Potassium: 4.2 mmol/L (ref 3.5–5.1)
Sodium: 136 mmol/L (ref 135–145)
Total Bilirubin: 0.4 mg/dL (ref 0.3–1.2)
Total Protein: 7.1 g/dL (ref 6.5–8.1)

## 2020-05-05 MED ORDER — SODIUM CHLORIDE 0.9 % IV SOLN
Freq: Once | INTRAVENOUS | Status: AC
  Filled 2020-05-05: qty 250

## 2020-05-05 MED ORDER — SODIUM CHLORIDE 0.9 % IV SOLN
Freq: Once | INTRAVENOUS | Status: DC
  Filled 2020-05-05: qty 250

## 2020-05-05 MED ORDER — HEPARIN SOD (PORK) LOCK FLUSH 100 UNIT/ML IV SOLN
500.0000 [IU] | Freq: Once | INTRAVENOUS | Status: AC | PRN
  Administered 2020-05-05: 500 [IU]
  Filled 2020-05-05: qty 5

## 2020-05-05 MED ORDER — SODIUM CHLORIDE 0.9 % IV SOLN
2000.0000 mg | Freq: Once | INTRAVENOUS | Status: AC
  Administered 2020-05-05: 2000 mg via INTRAVENOUS
  Filled 2020-05-05: qty 52.6

## 2020-05-05 MED ORDER — SODIUM CHLORIDE 0.9 % IV SOLN
10.0000 mg | Freq: Once | INTRAVENOUS | Status: AC
  Administered 2020-05-05: 10 mg via INTRAVENOUS
  Filled 2020-05-05: qty 10

## 2020-05-05 MED ORDER — SODIUM CHLORIDE 0.9% FLUSH
10.0000 mL | INTRAVENOUS | Status: DC | PRN
  Administered 2020-05-05: 10 mL
  Filled 2020-05-05: qty 10

## 2020-05-05 MED ORDER — PACLITAXEL PROTEIN-BOUND CHEMO INJECTION 100 MG
100.0000 mg/m2 | Freq: Once | INTRAVENOUS | Status: AC
  Administered 2020-05-05: 200 mg via INTRAVENOUS
  Filled 2020-05-05: qty 40

## 2020-05-05 MED ORDER — PALONOSETRON HCL INJECTION 0.25 MG/5ML
0.2500 mg | Freq: Once | INTRAVENOUS | Status: AC
  Administered 2020-05-05: 0.25 mg via INTRAVENOUS

## 2020-05-05 NOTE — Progress Notes (Signed)
Visited with patient and daughter in treatment. Patient's son has arrived from the [place] and she is very happy with having him here. He plans on staying for a few months.  Daughter stated that patient has a very difficult time with vomiting for about 24h after her treatment. She attempts to take anti-emetics but they don't stay down. Reviewed treatment plan and patient has minimal anti-emetic coverage in her premeds. Spoke to Dr Antonieta Pert desk nurse and she will ask about adding stronger coverage to her treatment plan.  Oncology Nurse Navigator Documentation  Oncology Nurse Navigator Flowsheets 05/05/2020  Confirmed Diagnosis Date -  Diagnosis Status -  Planned Course of Treatment -  Phase of Treatment -  Chemotherapy Pending- Reason: -  Chemotherapy Actual Start Date: -  Navigator Follow Up Date: 05/19/2020  Navigator Follow Up Reason: Follow-up Appointment;Chemotherapy  Navigator Location [facility]  Referral Date to RadOnc/MedOnc -  Navigator Encounter Type Treatment  Telephone -  Treatment Initiated Date -  Patient Visit Type MedOnc  Treatment Phase Active Tx  Barriers/Navigation Needs Coordination of Care;Cultural Needs;Family Concerns;Language/Communication  Education -  Interventions Psycho-Social Support;Other  Acuity Level 4-High Needs (Greater Than 4 Barriers Identified)  Referrals -  Coordination of Care -  Education Method -  Support Groups/Services Friends and Family  Time Spent with Patient 66

## 2020-05-05 NOTE — Patient Instructions (Signed)
[facility] Discharge Instructions for Patients Receiving Chemotherapy  Today you received the following chemotherapy agents Gemzar, Abraxane  To help prevent nausea and vomiting after your treatment, we encourage you to take your nausea medication    If you develop nausea and vomiting that is not controlled by your nausea medication, call the clinic.   BELOW ARE SYMPTOMS THAT SHOULD BE REPORTED IMMEDIATELY:  *FEVER GREATER THAN 100.5 F  *CHILLS WITH OR WITHOUT FEVER  NAUSEA AND VOMITING THAT IS NOT CONTROLLED WITH YOUR NAUSEA MEDICATION  *UNUSUAL SHORTNESS OF BREATH  *UNUSUAL BRUISING OR BLEEDING  TENDERNESS IN MOUTH AND THROAT WITH OR WITHOUT PRESENCE OF ULCERS  *URINARY PROBLEMS  *BOWEL PROBLEMS  UNUSUAL RASH Items with * indicate a potential emergency and should be followed up as soon as possible.  Feel free to call the clinic should you have any questions or concerns. The clinic phone number is [phone].  Please show the [place] at check-in to the Emergency Department and triage nurse.

## 2020-05-12 ENCOUNTER — Inpatient Hospital Stay (HOSPITAL_BASED_OUTPATIENT_CLINIC_OR_DEPARTMENT_OTHER): Payer: 59 | Admitting: Genetic Counselor

## 2020-05-12 ENCOUNTER — Other Ambulatory Visit: Payer: Self-pay

## 2020-05-12 ENCOUNTER — Encounter: Payer: Self-pay | Admitting: Genetic Counselor

## 2020-05-12 DIAGNOSIS — Z8041 Family history of malignant neoplasm of ovary: Secondary | ICD-10-CM | POA: Insufficient documentation

## 2020-05-12 DIAGNOSIS — C252 Malignant neoplasm of tail of pancreas: Secondary | ICD-10-CM | POA: Diagnosis not present

## 2020-05-12 DIAGNOSIS — Z1379 Encounter for other screening for genetic and chromosomal anomalies: Secondary | ICD-10-CM | POA: Insufficient documentation

## 2020-05-12 DIAGNOSIS — Z808 Family history of malignant neoplasm of other organs or systems: Secondary | ICD-10-CM

## 2020-05-12 DIAGNOSIS — Z8051 Family history of malignant neoplasm of kidney: Secondary | ICD-10-CM | POA: Insufficient documentation

## 2020-05-12 DIAGNOSIS — Z803 Family history of malignant neoplasm of breast: Secondary | ICD-10-CM | POA: Diagnosis not present

## 2020-05-12 NOTE — Progress Notes (Signed)
REFERRING PROVIDER: Volanda Napoleon, MD [address] [place],  [place] [address]  PRIMARY PROVIDER:  Rutherford Guys, MD  PRIMARY REASON FOR VISIT:  1. Malignant neoplasm of tail of pancreas ([place])   2. Genetic testing   3. Family history of breast cancer   4. Family history of ovarian cancer   5. Family history of kidney cancer   6. Family history of brain cancer      HISTORY OF PRESENT ILLNESS:   [name], a 54 y.o. female, was seen with her daughter, who acted as interpreter for the patient, for a [place] cancer genetics consultation at the request of Dr. Marin Olp due to a personal and family history of cancer.  [name] presents to clinic today to discuss the possibility of a hereditary predisposition to cancer, genetic testing, and to further clarify her future cancer risks, as well as potential cancer risks for family members.   In 2021, at the age of 87, [name] was diagnosed with pancreatic adenocarcinoma. The treatment plan has included chemotherapy. She recently underwent genetic testing to evaluate for the presence of a hereditary cancer mutation. [name] genetic test results were disclosed to her, as were recommendations warranted by these results. These results and recommendations are discussed in more detail below.  CANCER HISTORY:  Oncology History  Pancreatic cancer ([place])  02/14/2020 Initial Diagnosis   Pancreatic cancer ([place])   02/25/2020 -  Chemotherapy   The patient had palonosetron (ALOXI) injection 0.25 mg, 0.25 mg, Intravenous,  Once, 1 of 2 cycles Administration: 0.25 mg (05/05/2020) PACLitaxel-protein bound (ABRAXANE) chemo infusion 200 mg, 100 mg/m2 = 200 mg (80 % of original dose 125 mg/m2), Intravenous,  Once, 3 of 4 cycles Dose modification: 100 mg/m2 (80 % of original dose 125 mg/m2, Cycle 1, Reason: Provider Judgment) Administration: 200 mg (02/25/2020), 200 mg (03/03/2020), 200 mg (03/24/2020), 200 mg  (03/10/2020), 200 mg (04/08/2020), 200 mg (05/05/2020), 200 mg (03/31/2020), 200 mg (04/21/2020), 200 mg (04/29/2020) gemcitabine (GEMZAR) 2,000 mg in sodium chloride 0.9 % 250 mL chemo infusion, 1,900 mg, Intravenous,  Once, 3 of 4 cycles Administration: 2,000 mg (02/25/2020), 2,000 mg (03/03/2020), 2,000 mg (03/24/2020), 2,000 mg (03/10/2020), 2,000 mg (04/08/2020), 2,000 mg (05/05/2020), 2,000 mg (03/31/2020), 2,000 mg (04/21/2020), 2,000 mg (04/29/2020)  for chemotherapy treatment.    03/24/2020 Genetic Testing   Negative genetic testing:  No pathogenic variants detected on the [facility] Common Hereditary Cancers Panel + Pancreatic Cancer Panel + Pancreatitis Genes. Two variants of uncertain significance (VUS) were detected - one in the AXIN2 gene called c.1855G>A and a second in the MUTYH gene called c.932G>A. The report date is 03/24/2020.  The Common Hereditary Cancers Panel offered by [facility] includes sequencing and/or deletion duplication testing of the following 48 genes: APC, ATM, AXIN2, BARD1, BMPR1A, BRCA1, BRCA2, BRIP1, CDH1, CDK4, CDKN2A (p14ARF), CDKN2A (p16INK4a), CHEK2, CTNNA1, DICER1, EPCAM (Deletion/duplication testing only), GREM1 (promoter region deletion/duplication testing only), KIT, MEN1, MLH1, MSH2, MSH3, MSH6, MUTYH, NBN, NF1, NTHL1, PALB2, PDGFRA, PMS2, POLD1, POLE, PTEN, RAD50, RAD51C, RAD51D, RNF43, SDHB, SDHC, SDHD, SMAD4, SMARCA4. STK11, TP53, TSC1, TSC2, and VHL.  The following genes were evaluated for sequence changes only: SDHA and HOXB13 c.251G>A variant only.  The [facility] Pancreatic Cancer Panel with Chronic Pancreatitis Genes includes sequencing and/or deletion duplication analysis of the following 26 genes: APC, ATM, BMPR1A, BRCA1, BRCA2, CDKN2A, EPCAM, MEN1, MLH1, MSH2, MSH6, NF1, PALB2, PMS2, SMAD4, STK11, TP53, TSC1, TSC2, VHL, CASR, CFTR, CPA1, CTRC, PRSS1, and  SPINK1. The [facility] and PALLD genes were also analyzed.      RISK FACTORS:  Menarche was at age 19.  First live birth at age  32.  OCP use for approximately 0 years.  Ovaries intact: yes.  Hysterectomy: no.  Menopausal status: postmenopausal.  HRT use: 0 years. Colonoscopy: no; not examined. Mammogram within the last year: no. Number of breast biopsies: 0. Any excessive radiation exposure in the past: no   Past Medical History:  Diagnosis Date  . Diabetes mellitus without complication ([place])   . Family history of brain cancer   . Family history of breast cancer   . Family history of kidney cancer   . Family history of ovarian cancer   . GERD (gastroesophageal reflux disease)   . Hyperlipidemia   . Hypertension   . Kidney stone   . Pancreatic cancer ([place]) 02/03/2020  . Shingles   . Type 2 diabetes mellitus ([place])     Past Surgical History:  Procedure Laterality Date  . CESAREAN SECTION     x 4  . IR IMAGING GUIDED PORT INSERTION  02/18/2020  . KIDNEY STONE SURGERY      Social History   Socioeconomic History  . Marital status: Divorced    Spouse name: Not on file  . Number of children: Not on file  . Years of education: Not on file  . Highest education level: Not on file  Occupational History  . Not on file  Tobacco Use  . Smoking status: Never Smoker  . Smokeless tobacco: Never Used  Vaping Use  . Vaping Use: Never used  Substance and Sexual Activity  . Alcohol use: No  . Drug use: Never  . Sexual activity: Not on file  Other Topics Concern  . Not on file  Social History Narrative   ** Merged History Encounter **       Social Determinants of Health   Financial Resource Strain:   . Difficulty of Paying Living Expenses: Not on file  Food Insecurity:   . Worried About Charity fundraiser in the Last Year: Not on file  . Ran Out of Food in the Last Year: Not on file  Transportation Needs:   . Lack of Transportation (Medical): Not on file  . Lack of Transportation (Non-Medical): Not on file  Physical Activity:   . Days of Exercise per Week: Not on file  . Minutes of Exercise per  Session: Not on file  Stress:   . Feeling of Stress : Not on file  Social Connections:   . Frequency of Communication with Friends and Family: Not on file  . Frequency of Social Gatherings with Friends and Family: Not on file  . Attends Religious Services: Not on file  . Active Member of Clubs or Organizations: Not on file  . Attends Archivist Meetings: Not on file  . Marital Status: Not on file     FAMILY HISTORY:  We obtained a detailed, 4-generation family history.  Significant diagnoses are listed below: Family History  Problem Relation Age of Onset  . Heart attack Maternal Uncle   . Breast cancer Paternal Aunt        dx. in her 61s  . Brain cancer Paternal Uncle 46  . Kidney cancer Cousin        dx. <50  . Ovarian cancer Cousin   . Breast cancer Cousin        dx. "young"  . Ovarian cancer Cousin   .  Cancer Cousin        Cancer on the back, dx. in her 6s  . Lung cancer Cousin    [name] has two daughters (ages 16 and 71) and two sons (ages 60 and 75). She has four brothers and four sisters. None of these family members have had cancer.  [name] mother is 22 and has not had cancer. [name] had one maternal aunt and one maternal uncle, neither of whom have had cancer. Neither of her maternal grandparents had cancer. There are no known diagnoses of cancer on the maternal side of the family.  [name] father is 78 and has not had cancer. She had six paternal aunts and three paternal uncles. One aunt died from breast cancer diagnosed in her 29s. One uncle died from brain cancer that was diagnosed at the age of 59 and later metastasized. [name] had multiple paternal cousins with cancer - one was diagnosed with kidney and ovarian cancer when she was younger than 43, another had breast and ovarian cancer diagnosed at a "young" age, another had a cancer on her back diagnosed in her 68s, and another had lung cancer. She does not know  how old her paternal grandparents were when they died.   [name] is unaware of previous family history of genetic testing for hereditary cancer risks. Patient's maternal and paternal ancestors are of [name] descent. There is no reported Ashkenazi Jewish ancestry. There is no known consanguinity.  HEREDITARY CANCER GENETICS:  We discussed that, in general, most cancer is not inherited in families, but instead is sporadic or familial. Sporadic cancers occur by chance and typically happen at older ages (>50 years) as this type of cancer is caused by genetic changes acquired during an individual's lifetime. Some families have more cancers than would be expected by chance; however, the ages or types of cancer are not consistent with a known genetic mutation or known genetic mutations have been ruled out. This type of familial cancer is thought to be due to a combination of multiple genetic, environmental, hormonal, and lifestyle factors. While this combination of factors likely increases the risk of cancer, the exact source of this risk is not currently identifiable or testable.    We discussed that approximately 5-10% of cancer is hereditary, meaning that it is due to a mutation in a single gene that is passed down from generation to generation in a family. We discussed that testing can be beneficial for several reasons, including identifying whether targeted treatment options such as PARP inhibitors would be beneficial, knowing about other cancer risks, identifying potential screening and risk-reduction options that may be appropriate, and to understand if other family members could be at risk for cancer and allow them to undergo genetic testing.  GENETIC TEST RESULTS: Genetic testing reported out on 03/24/2020 through the [facility] Common Hereditary Cancers Panel + Pancreatic Cancer Panel + Pancreatitis Genes. No pathogenic variants were detected.   The Common Hereditary Cancers Panel offered by  [facility] includes sequencing and/or deletion duplication testing of the following 48 genes: APC, ATM, AXIN2, BARD1, BMPR1A, BRCA1, BRCA2, BRIP1, CDH1, CDK4, CDKN2A (p14ARF), CDKN2A (p16INK4a), CHEK2, CTNNA1, DICER1, EPCAM (Deletion/duplication testing only), GREM1 (promoter region deletion/duplication testing only), KIT, MEN1, MLH1, MSH2, MSH3, MSH6, MUTYH, NBN, NF1, NTHL1, PALB2, PDGFRA, PMS2, POLD1, POLE, PTEN, RAD50, RAD51C, RAD51D, RNF43, SDHB, SDHC, SDHD, SMAD4, SMARCA4. STK11, TP53, TSC1, TSC2, and VHL.  The following genes were evaluated for sequence changes only: SDHA and HOXB13 c.251G>A variant only.  The [facility] Pancreatic Cancer Panel with Chronic Pancreatitis Genes includes sequencing and/or deletion duplication analysis of the following 26 genes: APC, ATM, BMPR1A, BRCA1, BRCA2, CDKN2A, EPCAM, MEN1, MLH1, MSH2, MSH6, NF1, PALB2, PMS2, SMAD4, STK11, TP53, TSC1, TSC2, VHL, CASR, CFTR, CPA1, CTRC, PRSS1, and SPINK1. The [facility] and PALLD genes were also analyzed. The test report will be scanned into EPIC and located under the Molecular Pathology section of the Results Review tab.  A portion of the result report is included below for reference.     Because current genetic testing is not perfect, it is possible there may be a gene mutation in one of these genes that current testing cannot detect, but that chance is small.  We also discussed that there could be another gene that has not yet been discovered, or that we have not yet tested, that is responsible for the cancer diagnoses in the family. It is also possible there is a hereditary cause for the cancer in the family that Ms. Jasso did not inherit and therefore was not identified in her testing.  Therefore, it is important to remain in touch with cancer genetics in the future so that we can continue to offer Ms. Kawa the most up to date genetic testing.   Genetic testing did identify two variants of uncertain significance (VUS) - one in  the AXIN2 gene called c.1855G>A and a second in the MUTYH gene called c.932G>A.  At this time, it is unknown if these variants are associated with increased cancer risk or if they are normal findings, but most variants such as these get reclassified to being inconsequential. They should not be used to make medical management decisions. With time, we suspect the lab will determine the significance of these variants, if any. If we do learn more about them, we will try to contact Ms. Osterlund to discuss it further. However, it is important to stay in touch with [place] periodically and keep the address and phone number up to date.  CANCER SCREENING RECOMMENDATIONS: [name] test result is considered negative (normal).  This means that we have not identified a hereditary cause for her personal and family history of cancer at this time. While reassuring, this does not definitively rule out a hereditary predisposition to cancer. It is still possible that there could be genetic mutations that are undetectable by current technology. There could be genetic mutations in genes that have not been tested or identified to increase cancer risk.  Therefore, it is recommended she continue to follow the cancer management and screening guidelines provided by her oncology and primary healthcare providers.   An individual's cancer risk and medical management are not determined by genetic test results alone. Overall cancer risk assessment incorporates additional factors, including personal medical history, family history, and any available genetic information that may result in a personalized plan for cancer prevention and surveillance.  RECOMMENDATIONS FOR FAMILY MEMBERS:  Individuals in this family might be at some increased risk of developing cancer, over the general population risk, simply due to the family history of cancer.  We recommended women in this family have a yearly mammogram beginning at age 45, or 52 years  younger than the earliest onset of cancer, an annual clinical breast exam, and perform monthly breast self-exams. Women in this family should also have a gynecological exam as recommended by their primary provider. All family members should be referred for colonoscopy starting at age 68.   It is also possible there is a hereditary cause  for the cancer in Ms. Stene's family that she did not inherit and therefore was not identified in her.  Based on Ms. Grafton's family history, we recommended individuals on her paternal side of the family who are closely related to the family members who have had breast and/or ovarian cancers at young ages have genetic counseling and testing. [name] will let us know if we can be of any assistance in coordinating genetic counseling and/or testing for this family member.   FOLLOW-UP: Lastly, we discussed with [name] that cancer genetics is a rapidly advancing field and it is possible that new genetic tests will be appropriate for her and/or her family members in the future. We encouraged her to remain in contact with cancer genetics on an annual basis so we can update her personal and family histories and let her know of advances in cancer genetics that may benefit this family.   Our contact number was provided. [name] questions were answered to her satisfaction, and she knows she is welcome to call us at anytime with additional questions or concerns.   Clint Guy, MS, [facility] Genetic Counselor [email or web address] Phone: [phone]     _______________________________________________________________________ For Office Staff:  Number of people involved in session: 2 Was an Intern/ student involved with case: yes

## 2020-05-19 ENCOUNTER — Other Ambulatory Visit: Payer: Self-pay | Admitting: *Deleted

## 2020-05-19 ENCOUNTER — Inpatient Hospital Stay: Payer: 59

## 2020-05-19 ENCOUNTER — Encounter: Payer: Self-pay | Admitting: *Deleted

## 2020-05-19 ENCOUNTER — Encounter: Payer: Self-pay | Admitting: Hematology & Oncology

## 2020-05-19 ENCOUNTER — Inpatient Hospital Stay (HOSPITAL_BASED_OUTPATIENT_CLINIC_OR_DEPARTMENT_OTHER): Payer: 59 | Admitting: Hematology & Oncology

## 2020-05-19 ENCOUNTER — Other Ambulatory Visit: Payer: Self-pay

## 2020-05-19 VITALS — BP 129/73 | HR 60 | Temp 97.9°F | Resp 18 | Ht 64.0 in | Wt 210.0 lb

## 2020-05-19 DIAGNOSIS — C252 Malignant neoplasm of tail of pancreas: Secondary | ICD-10-CM

## 2020-05-19 DIAGNOSIS — Z5111 Encounter for antineoplastic chemotherapy: Secondary | ICD-10-CM | POA: Diagnosis not present

## 2020-05-19 LAB — CMP (CANCER CENTER ONLY)
ALT: 13 U/L (ref 0–44)
AST: 14 U/L — ABNORMAL LOW (ref 15–41)
Albumin: 3.6 g/dL (ref 3.5–5.0)
Alkaline Phosphatase: 37 U/L — ABNORMAL LOW (ref 38–126)
Anion gap: 8 (ref 5–15)
BUN: 12 mg/dL (ref 6–20)
CO2: 25 mmol/L (ref 22–32)
Calcium: 9.3 mg/dL (ref 8.9–10.3)
Chloride: 104 mmol/L (ref 98–111)
Creatinine: 0.63 mg/dL (ref 0.44–1.00)
GFR, Est AFR Am: >60 mL/min (ref >60)
GFR, Estimated: >60 mL/min (ref >60)
Glucose, Bld: 178 mg/dL — ABNORMAL HIGH (ref 70–99)
Potassium: 4.1 mmol/L (ref 3.5–5.1)
Sodium: 137 mmol/L (ref 135–145)
Total Bilirubin: 0.5 mg/dL (ref 0.3–1.2)
Total Protein: 7 g/dL (ref 6.5–8.1)

## 2020-05-19 LAB — CBC WITH DIFFERENTIAL (CANCER CENTER ONLY)
Abs Immature Granulocytes: 0.04 10*3/uL (ref 0.00–0.07)
Basophils Absolute: 0 10*3/uL (ref 0.0–0.1)
Basophils Relative: 0 %
Eosinophils Absolute: 0.1 10*3/uL (ref 0.0–0.5)
Eosinophils Relative: 2 %
HCT: 33.2 % — ABNORMAL LOW (ref 36.0–46.0)
Hemoglobin: 10.1 g/dL — ABNORMAL LOW (ref 12.0–15.0)
Immature Granulocytes: 1 %
Lymphocytes Relative: 32 %
Lymphs Abs: 1.6 10*3/uL (ref 0.7–4.0)
MCH: 26.6 pg (ref 26.0–34.0)
MCHC: 30.4 g/dL (ref 30.0–36.0)
MCV: 87.6 fL (ref 80.0–100.0)
Monocytes Absolute: 0.5 10*3/uL (ref 0.1–1.0)
Monocytes Relative: 11 %
Neutro Abs: 2.7 10*3/uL (ref 1.7–7.7)
Neutrophils Relative %: 54 %
Platelet Count: 371 10*3/uL (ref 150–400)
RBC: 3.79 MIL/uL — ABNORMAL LOW (ref 3.87–5.11)
RDW: 19.4 % — ABNORMAL HIGH (ref 11.5–15.5)
WBC Count: 4.9 10*3/uL (ref 4.0–10.5)
nRBC: 0 % (ref 0.0–0.2)

## 2020-05-19 LAB — CEA (IN HOUSE-CHCC): CEA (CHCC-In House): 4.71 ng/mL (ref 0.00–5.00)

## 2020-05-19 MED ORDER — PALONOSETRON HCL INJECTION 0.25 MG/5ML
INTRAVENOUS | Status: AC
  Filled 2020-05-19: qty 5

## 2020-05-19 MED ORDER — SODIUM CHLORIDE 0.9 % IV SOLN
2000.0000 mg | Freq: Once | INTRAVENOUS | Status: AC
  Administered 2020-05-19: 2000 mg via INTRAVENOUS
  Filled 2020-05-19: qty 52.6

## 2020-05-19 MED ORDER — SODIUM CHLORIDE 0.9 % IV SOLN
10.0000 mg | Freq: Once | INTRAVENOUS | Status: AC
  Administered 2020-05-19: 10 mg via INTRAVENOUS
  Filled 2020-05-19: qty 10

## 2020-05-19 MED ORDER — SODIUM CHLORIDE 0.9% FLUSH
10.0000 mL | Freq: Once | INTRAVENOUS | Status: AC
  Administered 2020-05-19: 10 mL via INTRAVENOUS
  Filled 2020-05-19: qty 10

## 2020-05-19 MED ORDER — PACLITAXEL PROTEIN-BOUND CHEMO INJECTION 100 MG
100.0000 mg/m2 | Freq: Once | INTRAVENOUS | Status: AC
  Administered 2020-05-19: 200 mg via INTRAVENOUS
  Filled 2020-05-19: qty 40

## 2020-05-19 MED ORDER — SODIUM CHLORIDE 0.9 % IV SOLN
Freq: Once | INTRAVENOUS | Status: AC
  Filled 2020-05-19: qty 250

## 2020-05-19 MED ORDER — LIDOCAINE-PRILOCAINE 2.5-2.5 % EX CREA: 1.0000 "application " | TOPICAL_CREAM | Freq: Once | CUTANEOUS | 10 refills | Status: AC

## 2020-05-19 MED ORDER — PALONOSETRON HCL INJECTION 0.25 MG/5ML
0.2500 mg | Freq: Once | INTRAVENOUS | Status: AC
  Administered 2020-05-19: 0.25 mg via INTRAVENOUS

## 2020-05-19 MED ORDER — HEPARIN SOD (PORK) LOCK FLUSH 100 UNIT/ML IV SOLN
500.0000 [IU] | Freq: Once | INTRAVENOUS | Status: AC
  Administered 2020-05-19: 500 [IU] via INTRAVENOUS
  Filled 2020-05-19: qty 5

## 2020-05-19 MED FILL — LIDOCAINE-PRILOCAINE 2.5-2.: 2.5-2.5 | 30 days supply | Qty: 30 | Fill #0

## 2020-05-19 NOTE — Progress Notes (Signed)
Hematology and Oncology Follow Up Visit  Angela Wilson [id number] Mar 27, 1966 54 y.o. 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 is back for follow-up.  I must say, that she does look quite good.  She really has not taken much in the way of pain medication. She does state that for couple days after treatment, her legs seem to hurt. There is no swelling. This seems to be from knees down.  Her last tumor markers were down nicely.  The CEA was down to 9.5.  The CA 125 was 125.  I am just very impressed with how well she has responded. She really has done nicely with treatment.  She has had no bleeding. She does have little bit of soreness in her neck. When I looked at her oral cavity, I did not see anything that looked suspicious.  She has had no fever. She has had no vomiting. She does get a little nauseated.  There is been no diarrhea.  The big issue with her is that a daughter who actually lives over in [place] is going to have a baby in December. Somehow, [name] wants to be there. As such, our goal really is to try to get her over there for the new grandchild.  It would be nice if we could treat her through November and then give her a month of December off so that she might be able to go over to [place].  Overall, her performance status is ECOG 1.    Medications:  Current Outpatient Medications:  .  albuterol (PROVENTIL HFA;VENTOLIN HFA) 108 (90 Base) MCG/ACT inhaler, Inhale 2 puffs into the lungs every 4 (four) hours as needed for wheezing or shortness of breath., Disp: 1 Inhaler, Rfl: 0 .  amLODipine (NORVASC) 10 MG tablet, Take 1 tablet (10 mg total) by mouth daily., Disp: 30 tablet, Rfl: 3 .  aspirin 81 MG chewable tablet, Chew 81 mg by mouth daily. , Disp: ,  Rfl:  .  cetirizine (ZYRTEC) 10 MG tablet, Take 1 tablet (10 mg total) by mouth daily., Disp: 30 tablet, Rfl: 3 .  CREON 36000-114000 units CPEP capsule, , Disp: , Rfl:  .  dronabinol (MARINOL) 2.5 MG capsule, Take 1 capsule (2.5 mg total) by mouth 2 (two) times daily before lunch and supper., Disp: 60 capsule, Rfl: 0 .  ergocalciferol (VITAMIN D2) 1.25 MG (50000 UT) capsule, Take 50,000 Units by mouth once a week. , Disp: , Rfl:  .  fentaNYL (DURAGESIC) 50 MCG/HR, Place 1 patch onto the skin every 3 (three) days., Disp: 10 patch, Rfl: 0 .  glipiZIDE (GLUCOTROL) 10 MG tablet, Take 10 mg by mouth 2 (two) times daily., Disp: , Rfl:  .  HYDROcodone-homatropine (HYCODAN) 5-1.5 MG/5ML syrup, Take 5 mLs by mouth every 6 (six) hours as needed for cough., Disp: 120 mL, Rfl: 0 .  lidocaine-prilocaine (EMLA) cream, APPLY TOPICALLY ONCE FOR 1 DOSE, Disp: , Rfl:  .  lidocaine-prilocaine (EMLA) cream, Apply 1 application topically once for 1 dose. Apply to port prior to treatment, Disp: 0.5 g, Rfl: 10 .  lisinopril (ZESTRIL) 10 MG tablet, Take 1 tablet (10 mg total) by mouth daily., Disp: 30 tablet, Rfl: 3 .  metFORMIN (GLUCOPHAGE XR) 500 MG 24 hr tablet, Take 1 tablet (500 mg total)  by mouth daily with breakfast., Disp: 30 tablet, Rfl: 3 .  omeprazole (PRILOSEC) 40 MG capsule, Take 1 capsule (40 mg total) by mouth daily., Disp: 30 capsule, Rfl: 3 .  ondansetron (ZOFRAN) 4 MG tablet, Take 1 tablet (4 mg total) by mouth every 6 (six) hours as needed for nausea., Disp: 20 tablet, Rfl: 0 .  Oxycodone HCl 10 MG TABS, Take 1 tablet (10 mg total) by mouth every 6 (six) hours as needed., Disp: 90 tablet, Rfl: 0 .  polyethylene glycol (MIRALAX / GLYCOLAX) 17 g packet, Take 17 g by mouth daily as needed., Disp: 14 each, Rfl: 0 .  pravastatin (PRAVACHOL) 40 MG tablet, Take 40 mg by mouth daily., Disp: , Rfl:  .  prochlorperazine (COMPAZINE) 10 MG tablet, Take 1 tablet (10 mg total) by mouth every 6 (six) hours as needed  for nausea or vomiting., Disp: 90 tablet, Rfl: 3 .  metoCLOPramide (REGLAN) 5 MG tablet, Take 1 tablet (5 mg total) by mouth 4 (four) times daily -  before meals and at bedtime., Disp: 45 tablet, Rfl: 3 No current facility-administered medications for this visit.  Facility-Administered Medications Ordered in Other Visits:  .  gemcitabine (GEMZAR) 2,000 mg in sodium chloride 0.9 % 250 mL chemo infusion, 2,000 mg, Intravenous, Once, Volanda Napoleon, MD, Last Rate: 605 mL/hr at 05/19/20 1237, 2,000 mg at 05/19/20 1237  Allergies:  Allergies  Allergen Reactions  . Iodinated Diagnostic Agents Itching and Rash  . Ivp Dye [Iodinated Diagnostic Agents] Itching and Rash  . Morphine And Related Hives  . Penicillins Rash    Past Medical History, Surgical history, Social history, and Family History were reviewed and updated.  Review of Systems: Review of Systems  Constitutional: Negative.   HENT:  Negative.   Eyes: Negative.   Respiratory: Negative.   Cardiovascular: Negative.   Gastrointestinal: Positive for abdominal pain and nausea.  Endocrine: Negative.   Genitourinary: Negative.    Musculoskeletal: Negative.   Skin: Negative.   Neurological: Negative.   Hematological: Negative.   Psychiatric/Behavioral: Negative.     Physical Exam:  height is 5\' 4"  (1.626 m) and weight is 210 lb 0.6 oz (95.3 kg). Her oral temperature is 97.9 F (36.6 C). Her blood pressure is 129/73 and her pulse is 60. Her respiration is 18 and oxygen saturation is 100%.   Wt Readings from Last 3 Encounters:  05/19/20 210 lb 0.6 oz (95.3 kg)  05/19/20 210 lb 6.4 oz (95.4 kg)  04/21/20 207 lb 1.3 oz (93.9 kg)   Her vital signs show a temperature of 97.1.  Pulse is 100.  Blood pressure 116/70.  Weight is 230 pounds.  Physical Exam Vitals reviewed.  HENT:     Head: Normocephalic and atraumatic.  Eyes:     Pupils: Pupils are equal, round, and reactive to light.  Cardiovascular:     Rate and Rhythm: Normal  rate and regular rhythm.     Heart sounds: Normal heart sounds.  Pulmonary:     Effort: Pulmonary effort is normal.     Breath sounds: Normal breath sounds.  Abdominal:     General: Bowel sounds are normal.     Palpations: Abdomen is soft.  Musculoskeletal:        General: No tenderness or deformity. Normal range of motion.     Cervical back: Normal range of motion.  Lymphadenopathy:     Cervical: No cervical adenopathy.  Skin:    General: Skin is warm and dry.  Findings: No erythema or rash.  Neurological:     Mental Status: She is alert and oriented to person, place, and time.  Psychiatric:        Behavior: Behavior normal.        Thought Content: Thought content normal.        Judgment: Judgment normal.      Lab Results  Component Value Date   WBC 4.9 05/19/2020   HGB 10.1 (L) 05/19/2020   HCT 33.2 (L) 05/19/2020   MCV 87.6 05/19/2020   PLT 371 05/19/2020     Chemistry      Component Value Date/Time   NA 137 05/19/2020 0840   NA 136 01/07/2020 1256   K 4.1 05/19/2020 0840   CL 104 05/19/2020 0840   CO2 25 05/19/2020 0840   BUN 12 05/19/2020 0840   BUN 10 01/07/2020 1256   CREATININE 0.63 05/19/2020 0840      Component Value Date/Time   CALCIUM 9.3 05/19/2020 0840   ALKPHOS 37 (L) 05/19/2020 0840   AST 14 (L) 05/19/2020 0840   ALT 13 05/19/2020 0840   BILITOT 0.5 05/19/2020 0840       Impression and Plan: [name] is a very nice 54 year old woman from [place].  She has metastatic pancreatic cancer.  This is clearly aggressive.  She really had no evidence of metastatic disease back in April.  I really thought when I first saw her in May that she will have surgery to resect this.  However, it is clearly progressed outside the confines of surgery.  She really is doing a nice job. She is responding. Hopefully, her tumor markers will continue to go down or least stabilize.  We will go ahead with her fourth cycle of treatment. We probably will do the  next set of scans in October.      Volanda Napoleon, MD 8/31/20211:02 PM

## 2020-05-20 LAB — CA 125: Cancer Antigen (CA) 125: 45.6 U/mL — ABNORMAL HIGH (ref 0.0–38.1)

## 2020-05-20 NOTE — Progress Notes (Signed)
Oncology Nurse Navigator Documentation  Oncology Nurse Navigator Flowsheets 05/19/2020  Confirmed Diagnosis Date -  Diagnosis Status -  Planned Course of Treatment -  Phase of Treatment -  Chemotherapy Pending- Reason: -  Chemotherapy Actual Start Date: -  Navigator Follow Up Date: 05/27/2020  Navigator Follow Up Reason: Chemotherapy  Navigator Location [facility]  Referral Date to RadOnc/MedOnc -  Navigator Encounter Type Treatment  Telephone -  Treatment Initiated Date -  Patient Visit Type MedOnc  Treatment Phase Active Tx  Barriers/Navigation Needs Coordination of Care;Cultural Needs;Family Concerns;Language/Communication  Education -  Interventions Psycho-Social Support  Acuity Level 4-High Needs (Greater Than 4 Barriers Identified)  Referrals -  Coordination of Care Appts  Education Method -  Support Groups/Services Friends and Family  Time Spent with Patient 30

## 2020-05-27 ENCOUNTER — Inpatient Hospital Stay: Payer: 59

## 2020-05-27 ENCOUNTER — Other Ambulatory Visit: Payer: Self-pay

## 2020-05-27 ENCOUNTER — Encounter: Payer: Self-pay | Admitting: *Deleted

## 2020-05-27 ENCOUNTER — Other Ambulatory Visit: Payer: Self-pay | Admitting: *Deleted

## 2020-05-27 ENCOUNTER — Inpatient Hospital Stay: Payer: 59 | Attending: Hematology & Oncology

## 2020-05-27 VITALS — HR 97

## 2020-05-27 VITALS — BP 113/76 | HR 104 | Temp 98.2°F | Resp 17

## 2020-05-27 DIAGNOSIS — C779 Secondary and unspecified malignant neoplasm of lymph node, unspecified: Secondary | ICD-10-CM | POA: Diagnosis not present

## 2020-05-27 DIAGNOSIS — C252 Malignant neoplasm of tail of pancreas: Secondary | ICD-10-CM

## 2020-05-27 DIAGNOSIS — J910 Malignant pleural effusion: Secondary | ICD-10-CM | POA: Diagnosis not present

## 2020-05-27 DIAGNOSIS — Z803 Family history of malignant neoplasm of breast: Secondary | ICD-10-CM

## 2020-05-27 DIAGNOSIS — K5900 Constipation, unspecified: Secondary | ICD-10-CM | POA: Insufficient documentation

## 2020-05-27 DIAGNOSIS — Z5111 Encounter for antineoplastic chemotherapy: Secondary | ICD-10-CM | POA: Diagnosis not present

## 2020-05-27 DIAGNOSIS — Z95828 Presence of other vascular implants and grafts: Secondary | ICD-10-CM

## 2020-05-27 DIAGNOSIS — R5383 Other fatigue: Secondary | ICD-10-CM | POA: Insufficient documentation

## 2020-05-27 LAB — CBC WITH DIFFERENTIAL (CANCER CENTER ONLY)
Abs Immature Granulocytes: 0.53 10*3/uL — ABNORMAL HIGH (ref 0.00–0.07)
Basophils Absolute: 0.1 10*3/uL (ref 0.0–0.1)
Basophils Relative: 1 %
Eosinophils Absolute: 0.1 10*3/uL (ref 0.0–0.5)
Eosinophils Relative: 2 %
HCT: 32.6 % — ABNORMAL LOW (ref 36.0–46.0)
Hemoglobin: 10.2 g/dL — ABNORMAL LOW (ref 12.0–15.0)
Immature Granulocytes: 6 %
Lymphocytes Relative: 22 %
Lymphs Abs: 2 10*3/uL (ref 0.7–4.0)
MCH: 27.7 pg (ref 26.0–34.0)
MCHC: 31.3 g/dL (ref 30.0–36.0)
MCV: 88.6 fL (ref 80.0–100.0)
Monocytes Absolute: 0.5 10*3/uL (ref 0.1–1.0)
Monocytes Relative: 6 %
Neutro Abs: 5.7 10*3/uL (ref 1.7–7.7)
Neutrophils Relative %: 63 %
Platelet Count: 244 10*3/uL (ref 150–400)
RBC: 3.68 MIL/uL — ABNORMAL LOW (ref 3.87–5.11)
RDW: 18.5 % — ABNORMAL HIGH (ref 11.5–15.5)
WBC Count: 8.9 10*3/uL (ref 4.0–10.5)
nRBC: 0.4 % — ABNORMAL HIGH (ref 0.0–0.2)

## 2020-05-27 LAB — CMP (CANCER CENTER ONLY)
ALT: 19 U/L (ref 0–44)
AST: 16 U/L (ref 15–41)
Albumin: 3.6 g/dL (ref 3.5–5.0)
Alkaline Phosphatase: 44 U/L (ref 38–126)
Anion gap: 8 (ref 5–15)
BUN: 14 mg/dL (ref 6–20)
CO2: 26 mmol/L (ref 22–32)
Calcium: 9.4 mg/dL (ref 8.9–10.3)
Chloride: 102 mmol/L (ref 98–111)
Creatinine: 0.6 mg/dL (ref 0.44–1.00)
GFR, Est AFR Am: >60 mL/min (ref >60)
GFR, Estimated: >60 mL/min (ref >60)
Glucose, Bld: 176 mg/dL — ABNORMAL HIGH (ref 70–99)
Potassium: 4.1 mmol/L (ref 3.5–5.1)
Sodium: 136 mmol/L (ref 135–145)
Total Bilirubin: 0.4 mg/dL (ref 0.3–1.2)
Total Protein: 6.9 g/dL (ref 6.5–8.1)

## 2020-05-27 MED ORDER — SODIUM CHLORIDE 0.9 % IV SOLN
10.0000 mg | Freq: Once | INTRAVENOUS | Status: AC
  Administered 2020-05-27: 10 mg via INTRAVENOUS
  Filled 2020-05-27: qty 10

## 2020-05-27 MED ORDER — SODIUM CHLORIDE 0.9% FLUSH
10.0000 mL | INTRAVENOUS | Status: DC | PRN
  Administered 2020-05-27: 10 mL
  Filled 2020-05-27: qty 10

## 2020-05-27 MED ORDER — PALONOSETRON HCL INJECTION 0.25 MG/5ML
0.2500 mg | Freq: Once | INTRAVENOUS | Status: AC
  Administered 2020-05-27: 0.25 mg via INTRAVENOUS

## 2020-05-27 MED ORDER — HEPARIN SOD (PORK) LOCK FLUSH 100 UNIT/ML IV SOLN
500.0000 [IU] | Freq: Once | INTRAVENOUS | Status: AC | PRN
  Administered 2020-05-27: 500 [IU]
  Filled 2020-05-27: qty 5

## 2020-05-27 MED ORDER — PALONOSETRON HCL INJECTION 0.25 MG/5ML
INTRAVENOUS | Status: AC
  Filled 2020-05-27: qty 5

## 2020-05-27 MED ORDER — SODIUM CHLORIDE 0.9% FLUSH
10.0000 mL | Freq: Once | INTRAVENOUS | Status: AC
  Administered 2020-05-27: 10 mL via INTRAVENOUS
  Filled 2020-05-27: qty 10

## 2020-05-27 MED ORDER — SODIUM CHLORIDE 0.9 % IV SOLN
Freq: Once | INTRAVENOUS | Status: AC
  Filled 2020-05-27: qty 250

## 2020-05-27 MED ORDER — PACLITAXEL PROTEIN-BOUND CHEMO INJECTION 100 MG
100.0000 mg/m2 | Freq: Once | INTRAVENOUS | Status: AC
  Administered 2020-05-27: 200 mg via INTRAVENOUS
  Filled 2020-05-27: qty 40

## 2020-05-27 MED ORDER — SODIUM CHLORIDE 0.9 % IV SOLN
2000.0000 mg | Freq: Once | INTRAVENOUS | Status: AC
  Administered 2020-05-27: 2000 mg via INTRAVENOUS
  Filled 2020-05-27: qty 52.6

## 2020-05-27 NOTE — Patient Instructions (Signed)
[facility] Discharge Instructions for Patients Receiving Chemotherapy  Today you received the following chemotherapy agents Gemzar, Abraxane  To help prevent nausea and vomiting after your treatment, we encourage you to take your nausea medication    If you develop nausea and vomiting that is not controlled by your nausea medication, call the clinic.   BELOW ARE SYMPTOMS THAT SHOULD BE REPORTED IMMEDIATELY:  *FEVER GREATER THAN 100.5 F  *CHILLS WITH OR WITHOUT FEVER  NAUSEA AND VOMITING THAT IS NOT CONTROLLED WITH YOUR NAUSEA MEDICATION  *UNUSUAL SHORTNESS OF BREATH  *UNUSUAL BRUISING OR BLEEDING  TENDERNESS IN MOUTH AND THROAT WITH OR WITHOUT PRESENCE OF ULCERS  *URINARY PROBLEMS  *BOWEL PROBLEMS  UNUSUAL RASH Items with * indicate a potential emergency and should be followed up as soon as possible.  Feel free to call the clinic should you have any questions or concerns. The clinic phone number is [phone].  Please show the [place] at check-in to the Emergency Department and triage nurse.

## 2020-05-27 NOTE — Progress Notes (Signed)
Patient continues to do well. She is having minimal side effects from her treatment. She is eating well. Her son is still visiting from [place]. They continue to work at trying to get her daughter to be able to visit. Rehab, her daughter here, states that they may need another letter in the coming weeks for application for the daughters VISA. I told them to let [place] know.  Oncology Nurse Navigator Documentation  Oncology Nurse Navigator Flowsheets 05/27/2020  Confirmed Diagnosis Date -  Diagnosis Status -  Planned Course of Treatment -  Phase of Treatment -  Chemotherapy Pending- Reason: -  Chemotherapy Actual Start Date: -  Navigator Follow Up Date: 06/15/2020  Navigator Follow Up Reason: Follow-up Appointment;Chemotherapy  Navigator Location [facility]  Referral Date to RadOnc/MedOnc -  Navigator Encounter Type Treatment  Telephone -  Treatment Initiated Date -  Patient Visit Type MedOnc  Treatment Phase Active Tx  Barriers/Navigation Needs Coordination of Care;Cultural Needs;Family Concerns;Language/Communication  Education -  Interventions Psycho-Social Support  Acuity Level 4-High Needs (Greater Than 4 Barriers Identified)  Referrals -  Coordination of Care -  Education Method -  Support Groups/Services Friends and Family  Time Spent with Patient 30

## 2020-06-02 ENCOUNTER — Other Ambulatory Visit: Payer: Self-pay | Admitting: *Deleted

## 2020-06-02 DIAGNOSIS — C252 Malignant neoplasm of tail of pancreas: Secondary | ICD-10-CM

## 2020-06-03 ENCOUNTER — Inpatient Hospital Stay: Payer: 59

## 2020-06-03 ENCOUNTER — Other Ambulatory Visit: Payer: Self-pay

## 2020-06-03 DIAGNOSIS — Z5111 Encounter for antineoplastic chemotherapy: Secondary | ICD-10-CM | POA: Diagnosis not present

## 2020-06-03 DIAGNOSIS — C252 Malignant neoplasm of tail of pancreas: Secondary | ICD-10-CM

## 2020-06-03 LAB — CBC WITH DIFFERENTIAL (CANCER CENTER ONLY)
Abs Immature Granulocytes: 0.18 10*3/uL — ABNORMAL HIGH (ref 0.00–0.07)
Basophils Absolute: 0 10*3/uL (ref 0.0–0.1)
Basophils Relative: 1 %
Eosinophils Absolute: 0.1 10*3/uL (ref 0.0–0.5)
Eosinophils Relative: 2 %
HCT: 30.9 % — ABNORMAL LOW (ref 36.0–46.0)
Hemoglobin: 9.6 g/dL — ABNORMAL LOW (ref 12.0–15.0)
Immature Granulocytes: 4 %
Lymphocytes Relative: 31 %
Lymphs Abs: 1.4 10*3/uL (ref 0.7–4.0)
MCH: 27.6 pg (ref 26.0–34.0)
MCHC: 31.1 g/dL (ref 30.0–36.0)
MCV: 88.8 fL (ref 80.0–100.0)
Monocytes Absolute: 0.2 10*3/uL (ref 0.1–1.0)
Monocytes Relative: 5 %
Neutro Abs: 2.6 10*3/uL (ref 1.7–7.7)
Neutrophils Relative %: 57 %
Platelet Count: 142 10*3/uL — ABNORMAL LOW (ref 150–400)
RBC: 3.48 MIL/uL — ABNORMAL LOW (ref 3.87–5.11)
RDW: 17.8 % — ABNORMAL HIGH (ref 11.5–15.5)
WBC Count: 4.6 10*3/uL (ref 4.0–10.5)
nRBC: 0 % (ref 0.0–0.2)

## 2020-06-03 LAB — COMPREHENSIVE METABOLIC PANEL
ALT: 21 U/L (ref 0–44)
AST: 17 U/L (ref 15–41)
Albumin: 3.5 g/dL (ref 3.5–5.0)
Alkaline Phosphatase: 38 U/L (ref 38–126)
Anion gap: 7 (ref 5–15)
BUN: 14 mg/dL (ref 6–20)
CO2: 27 mmol/L (ref 22–32)
Calcium: 9.1 mg/dL (ref 8.9–10.3)
Chloride: 102 mmol/L (ref 98–111)
Creatinine, Ser: 0.58 mg/dL (ref 0.44–1.00)
GFR calc Af Amer: >60 mL/min (ref >60)
GFR calc non Af Amer: >60 mL/min (ref >60)
Glucose, Bld: 184 mg/dL — ABNORMAL HIGH (ref 70–99)
Potassium: 4.4 mmol/L (ref 3.5–5.1)
Sodium: 136 mmol/L (ref 135–145)
Total Bilirubin: 0.4 mg/dL (ref 0.3–1.2)
Total Protein: 6.5 g/dL (ref 6.5–8.1)

## 2020-06-03 LAB — CEA (IN HOUSE-CHCC): CEA (CHCC-In House): 3.91 ng/mL (ref 0.00–5.00)

## 2020-06-03 MED ORDER — SODIUM CHLORIDE 0.9 % IV SOLN
2000.0000 mg | Freq: Once | INTRAVENOUS | Status: AC
  Administered 2020-06-03: 2000 mg via INTRAVENOUS
  Filled 2020-06-03: qty 52.6

## 2020-06-03 MED ORDER — PACLITAXEL PROTEIN-BOUND CHEMO INJECTION 100 MG
100.0000 mg/m2 | Freq: Once | INTRAVENOUS | Status: AC
  Administered 2020-06-03: 200 mg via INTRAVENOUS
  Filled 2020-06-03: qty 40

## 2020-06-03 MED ORDER — SODIUM CHLORIDE 0.9 % IV SOLN
Freq: Once | INTRAVENOUS | Status: AC
  Filled 2020-06-03: qty 250

## 2020-06-03 MED ORDER — SODIUM CHLORIDE 0.9% FLUSH
10.0000 mL | INTRAVENOUS | Status: DC | PRN
  Administered 2020-06-03: 10 mL
  Filled 2020-06-03: qty 10

## 2020-06-03 MED ORDER — SODIUM CHLORIDE 0.9 % IV SOLN
10.0000 mg | Freq: Once | INTRAVENOUS | Status: AC
  Administered 2020-06-03: 10 mg via INTRAVENOUS
  Filled 2020-06-03: qty 10

## 2020-06-03 MED ORDER — PALONOSETRON HCL INJECTION 0.25 MG/5ML
0.2500 mg | Freq: Once | INTRAVENOUS | Status: AC
  Administered 2020-06-03: 0.25 mg via INTRAVENOUS

## 2020-06-03 MED ORDER — PALONOSETRON HCL INJECTION 0.25 MG/5ML
INTRAVENOUS | Status: AC
  Filled 2020-06-03: qty 5

## 2020-06-03 MED ORDER — SODIUM CHLORIDE 0.9 % IV SOLN
Freq: Once | INTRAVENOUS | Status: DC
  Filled 2020-06-03: qty 250

## 2020-06-03 MED ORDER — HEPARIN SOD (PORK) LOCK FLUSH 100 UNIT/ML IV SOLN
500.0000 [IU] | Freq: Once | INTRAVENOUS | Status: AC | PRN
  Administered 2020-06-03: 500 [IU]
  Filled 2020-06-03: qty 5

## 2020-06-03 NOTE — Patient Instructions (Signed)
[facility] Discharge Instructions for Patients Receiving Chemotherapy  Today you received the following chemotherapy agents Gemzar, Abraxane  To help prevent nausea and vomiting after your treatment, we encourage you to take your nausea medication    If you develop nausea and vomiting that is not controlled by your nausea medication, call the clinic.   BELOW ARE SYMPTOMS THAT SHOULD BE REPORTED IMMEDIATELY:  *FEVER GREATER THAN 100.5 F  *CHILLS WITH OR WITHOUT FEVER  NAUSEA AND VOMITING THAT IS NOT CONTROLLED WITH YOUR NAUSEA MEDICATION  *UNUSUAL SHORTNESS OF BREATH  *UNUSUAL BRUISING OR BLEEDING  TENDERNESS IN MOUTH AND THROAT WITH OR WITHOUT PRESENCE OF ULCERS  *URINARY PROBLEMS  *BOWEL PROBLEMS  UNUSUAL RASH Items with * indicate a potential emergency and should be followed up as soon as possible.  Feel free to call the clinic should you have any questions or concerns. The clinic phone number is [phone].  Please show the [place] at check-in to the Emergency Department and triage nurse.

## 2020-06-03 NOTE — Patient Instructions (Signed)

## 2020-06-08 ENCOUNTER — Encounter: Payer: Self-pay | Admitting: *Deleted

## 2020-06-09 ENCOUNTER — Encounter: Payer: Self-pay | Admitting: *Deleted

## 2020-06-09 NOTE — Progress Notes (Signed)
Following email sent per patient and family request.  To Whom It May Concern,  Patient, Angela Wilson (DOB [birth year]) has been diagnosed with metastatic pancreatic cancer and is currently undergoing chemotherapy. She requires the urgent physical presence, and assistance of her daughter and son. Please inform our office of what information you need to move forward with this request. Please include fax number as we can't send medical records through email.   Patient information Angela Wilson [birth year] Passport #: [id number]  Medical Passport request for the following:  Name: Angela Wilson Date of birth:01/05/1990 Passport # [id number] Confirmation # [id number]  Oncology Nurse Navigator Documentation  Oncology Nurse Navigator Flowsheets 06/09/2020  Confirmed Diagnosis Date -  Diagnosis Status -  Planned Course of Treatment -  Phase of Treatment -  Chemotherapy Pending- Reason: -  Chemotherapy Actual Start Date: -  Navigator Follow Up Date: 06/15/2020  Navigator Follow Up Reason: Follow-up Appointment;Chemotherapy  Navigator Location [facility]  Referral Date to RadOnc/MedOnc -  Navigator Encounter Type Letter/Fax/Email  Telephone -  Treatment Initiated Date -  Patient Visit Type MedOnc  Treatment Phase Active Tx  Barriers/Navigation Needs Coordination of Care;Cultural Needs;Family Concerns;Language/Communication  Education -  Interventions Other  Acuity Level 2-Minimal Needs (1-2 Barriers Identified)  Referrals -  Coordination of Care -  Education Method -  Support Groups/Services Friends and Family  Time Spent with Patient 16

## 2020-06-15 ENCOUNTER — Inpatient Hospital Stay: Payer: 59

## 2020-06-15 ENCOUNTER — Other Ambulatory Visit: Payer: Self-pay

## 2020-06-15 ENCOUNTER — Encounter: Payer: Self-pay | Admitting: *Deleted

## 2020-06-15 ENCOUNTER — Encounter: Payer: Self-pay | Admitting: Family

## 2020-06-15 ENCOUNTER — Inpatient Hospital Stay (HOSPITAL_BASED_OUTPATIENT_CLINIC_OR_DEPARTMENT_OTHER): Payer: 59 | Admitting: Family

## 2020-06-15 VITALS — BP 109/62 | HR 98 | Temp 98.7°F | Resp 18 | Ht 64.0 in | Wt 212.0 lb

## 2020-06-15 DIAGNOSIS — C252 Malignant neoplasm of tail of pancreas: Secondary | ICD-10-CM

## 2020-06-15 DIAGNOSIS — Z5111 Encounter for antineoplastic chemotherapy: Secondary | ICD-10-CM | POA: Diagnosis not present

## 2020-06-15 LAB — CBC WITH DIFFERENTIAL (CANCER CENTER ONLY)
Abs Immature Granulocytes: 0.06 10*3/uL (ref 0.00–0.07)
Basophils Absolute: 0 10*3/uL (ref 0.0–0.1)
Basophils Relative: 1 %
Eosinophils Absolute: 0.1 10*3/uL (ref 0.0–0.5)
Eosinophils Relative: 4 %
HCT: 32.5 % — ABNORMAL LOW (ref 36.0–46.0)
Hemoglobin: 9.9 g/dL — ABNORMAL LOW (ref 12.0–15.0)
Immature Granulocytes: 2 %
Lymphocytes Relative: 37 %
Lymphs Abs: 1.4 10*3/uL (ref 0.7–4.0)
MCH: 27.8 pg (ref 26.0–34.0)
MCHC: 30.5 g/dL (ref 30.0–36.0)
MCV: 91.3 fL (ref 80.0–100.0)
Monocytes Absolute: 0.4 10*3/uL (ref 0.1–1.0)
Monocytes Relative: 12 %
Neutro Abs: 1.6 10*3/uL — ABNORMAL LOW (ref 1.7–7.7)
Neutrophils Relative %: 44 %
Platelet Count: 248 10*3/uL (ref 150–400)
RBC: 3.56 MIL/uL — ABNORMAL LOW (ref 3.87–5.11)
RDW: 18.2 % — ABNORMAL HIGH (ref 11.5–15.5)
WBC Count: 3.7 10*3/uL — ABNORMAL LOW (ref 4.0–10.5)
nRBC: 0 % (ref 0.0–0.2)

## 2020-06-15 LAB — CMP (CANCER CENTER ONLY)
ALT: 16 U/L (ref 0–44)
AST: 16 U/L (ref 15–41)
Albumin: 3.5 g/dL (ref 3.5–5.0)
Alkaline Phosphatase: 38 U/L (ref 38–126)
Anion gap: 6 (ref 5–15)
BUN: 15 mg/dL (ref 6–20)
CO2: 26 mmol/L (ref 22–32)
Calcium: 9.1 mg/dL (ref 8.9–10.3)
Chloride: 103 mmol/L (ref 98–111)
Creatinine: 0.61 mg/dL (ref 0.44–1.00)
GFR, Est AFR Am: >60 mL/min (ref >60)
GFR, Estimated: >60 mL/min (ref >60)
Glucose, Bld: 153 mg/dL — ABNORMAL HIGH (ref 70–99)
Potassium: 4.3 mmol/L (ref 3.5–5.1)
Sodium: 135 mmol/L (ref 135–145)
Total Bilirubin: 0.4 mg/dL (ref 0.3–1.2)
Total Protein: 6.6 g/dL (ref 6.5–8.1)

## 2020-06-15 LAB — RETICULOCYTES
Immature Retic Fract: 25.4 % — ABNORMAL HIGH (ref 2.3–15.9)
RBC.: 3.55 MIL/uL — ABNORMAL LOW (ref 3.87–5.11)
Retic Count, Absolute: 202.7 10*3/uL — ABNORMAL HIGH (ref 19.0–186.0)
Retic Ct Pct: 5.7 % — ABNORMAL HIGH (ref 0.4–3.1)

## 2020-06-15 LAB — LACTATE DEHYDROGENASE: LDH: 209 U/L — ABNORMAL HIGH (ref 98–192)

## 2020-06-15 MED ORDER — SODIUM CHLORIDE 0.9 % IV SOLN
Freq: Once | INTRAVENOUS | Status: DC
  Filled 2020-06-15: qty 250

## 2020-06-15 MED ORDER — PALONOSETRON HCL INJECTION 0.25 MG/5ML
INTRAVENOUS | Status: AC
  Filled 2020-06-15: qty 5

## 2020-06-15 MED ORDER — SODIUM CHLORIDE 0.9% FLUSH
10.0000 mL | INTRAVENOUS | Status: DC | PRN
  Administered 2020-06-15: 10 mL
  Filled 2020-06-15: qty 10

## 2020-06-15 MED ORDER — PALONOSETRON HCL INJECTION 0.25 MG/5ML
0.2500 mg | Freq: Once | INTRAVENOUS | Status: AC
  Administered 2020-06-15: 0.25 mg via INTRAVENOUS

## 2020-06-15 MED ORDER — SODIUM CHLORIDE 0.9 % IV SOLN
Freq: Once | INTRAVENOUS | Status: AC
  Filled 2020-06-15: qty 250

## 2020-06-15 MED ORDER — SODIUM CHLORIDE 0.9 % IV SOLN
2000.0000 mg | Freq: Once | INTRAVENOUS | Status: AC
  Administered 2020-06-15: 2000 mg via INTRAVENOUS
  Filled 2020-06-15: qty 52.6

## 2020-06-15 MED ORDER — HEPARIN SOD (PORK) LOCK FLUSH 100 UNIT/ML IV SOLN
500.0000 [IU] | Freq: Once | INTRAVENOUS | Status: AC | PRN
  Administered 2020-06-15: 500 [IU]
  Filled 2020-06-15: qty 5

## 2020-06-15 MED ORDER — PACLITAXEL PROTEIN-BOUND CHEMO INJECTION 100 MG
100.0000 mg/m2 | Freq: Once | INTRAVENOUS | Status: AC
  Administered 2020-06-15: 200 mg via INTRAVENOUS
  Filled 2020-06-15: qty 40

## 2020-06-15 MED ORDER — SODIUM CHLORIDE 0.9 % IV SOLN
10.0000 mg | Freq: Once | INTRAVENOUS | Status: AC
  Administered 2020-06-15: 10 mg via INTRAVENOUS
  Filled 2020-06-15: qty 10

## 2020-06-15 NOTE — Progress Notes (Signed)
Hematology and Oncology Follow Up Visit  Angela Wilson [id number] 1965/12/09 54 y.o. 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 She does have some fatigue at times and will take a break to rest when needed.  She is doing well and has noticed that her energy and appetite have improved.  She has constipation at times and will try taking her Miralax regularly. No abdominal discomfort or bloating.  No fever, chills, n/v, cough, rash, dizziness, SOB, chest pain, palpitations or changes in bowel or bladder habits.  CEA at her last visit was down to 3.91 and CA 125 was down to 45! No adenopathy.  No swelling, tenderness, numbness or tingling in her extremities.  No falls or syncope.  She feels that she is hydrating properly and her weight is stable at 212 lbs.   ECOG Performance Status: 1 - Symptomatic but completely ambulatory  Medications:  Allergies as of 06/15/2020      Reactions   Iodinated Diagnostic Agents Itching, Rash   Ivp Dye [iodinated Diagnostic Agents] Itching, Rash   Morphine And Related Hives   Penicillins Rash      Medication List       Accurate as of June 15, 2020 11:43 AM. If you have any questions, ask your nurse or doctor.        albuterol 108 (90 Base) MCG/ACT inhaler Commonly known as: VENTOLIN HFA Inhale 2 puffs into the lungs every 4 (four) hours as needed for wheezing or shortness of breath.   amLODipine 10 MG tablet Commonly known as: NORVASC Take 1 tablet (10 mg total) by mouth daily.   aspirin 81 MG chewable tablet Chew 81 mg by mouth daily.   cetirizine 10 MG tablet Commonly known as: ZYRTEC Take 1 tablet (10 mg total) by mouth daily.   Creon 36000 UNITS Cpep  capsule Generic drug: lipase/protease/amylase   dronabinol 2.5 MG capsule Commonly known as: MARINOL Take 1 capsule (2.5 mg total) by mouth 2 (two) times daily before lunch and supper.   ergocalciferol 1.25 MG (50000 UT) capsule Commonly known as: VITAMIN D2 Take 50,000 Units by mouth once a week.   fentaNYL 50 MCG/HR Commonly known as: [place] 1 patch onto the skin every 3 (three) days.   glipiZIDE 10 MG tablet Commonly known as: GLUCOTROL Take 10 mg by mouth 2 (two) times daily.   HYDROcodone-homatropine 5-1.5 MG/5ML syrup Commonly known as: Hycodan Take 5 mLs by mouth every 6 (six) hours as needed for cough.   lidocaine-prilocaine cream Commonly known as: EMLA APPLY TOPICALLY ONCE FOR 1 DOSE   lisinopril 10 MG tablet Commonly known as: ZESTRIL Take 1 tablet (10 mg total) by mouth daily.   metFORMIN 500 MG 24 hr tablet Commonly known as: Glucophage XR Take 1 tablet (500 mg total) by mouth daily with breakfast.   metoCLOPramide 5 MG tablet Commonly known as: Reglan Take 1 tablet (5 mg total) by mouth 4 (four) times daily -  before meals and at bedtime.   omeprazole 40 MG capsule Commonly known as: PRILOSEC Take 1 capsule (40 mg total) by mouth daily.   ondansetron 4 MG tablet Commonly known as: ZOFRAN Take 1 tablet (4 mg total) by mouth every 6 (six) hours as  needed for nausea.   Oxycodone HCl 10 MG Tabs Take 1 tablet (10 mg total) by mouth every 6 (six) hours as needed.   polyethylene glycol 17 g packet Commonly known as: MIRALAX / GLYCOLAX Take 17 g by mouth daily as needed.   pravastatin 40 MG tablet Commonly known as: PRAVACHOL Take 40 mg by mouth daily.   prochlorperazine 10 MG tablet Commonly known as: COMPAZINE Take 1 tablet (10 mg total) by mouth every 6 (six) hours as needed for nausea or vomiting.       Allergies:  Allergies  Allergen Reactions  . Iodinated Diagnostic Agents Itching and Rash  . Ivp Dye [Iodinated Diagnostic  Agents] Itching and Rash  . Morphine And Related Hives  . Penicillins Rash    Past Medical History, Surgical history, Social history, and Family History were reviewed and updated.  Review of Systems: All other 10 point review of systems is negative.   Physical Exam:  vitals were not taken for this visit.   Wt Readings from Last 3 Encounters:  05/19/20 210 lb 0.6 oz (95.3 kg)  05/19/20 210 lb 6.4 oz (95.4 kg)  04/21/20 207 lb 1.3 oz (93.9 kg)    Ocular: Sclerae unicteric, pupils equal, round and reactive to light Ear-nose-throat: Oropharynx clear, dentition fair Lymphatic: No cervical or supraclavicular adenopathy Lungs no rales or rhonchi, good excursion bilaterally Heart regular rate and rhythm, no murmur appreciated Abd soft, nontender, positive bowel sounds, no liver or spleen tip palpated on exam, no fluid wave  MSK no focal spinal tenderness, no joint edema Neuro: non-focal, well-oriented, appropriate affect Breasts: Deferred   Lab Results  Component Value Date   WBC 3.7 (L) 06/15/2020   HGB 9.9 (L) 06/15/2020   HCT 32.5 (L) 06/15/2020   MCV 91.3 06/15/2020   PLT 248 06/15/2020   Lab Results  Component Value Date   FERRITIN 102 02/03/2020   IRON 23 (L) 02/03/2020   TIBC 325 02/03/2020   UIBC 302 02/03/2020   IRONPCTSAT 7 (L) 02/03/2020   Lab Results  Component Value Date   RETICCTPCT 5.7 (H) 06/15/2020   RBC 3.55 (L) 06/15/2020   No results found for: KPAFRELGTCHN, LAMBDASER, KAPLAMBRATIO No results found for: IGGSERUM, IGA, IGMSERUM No results found for: Odetta Pink, SPEI   Chemistry      Component Value Date/Time   NA 136 06/03/2020 0930   NA 136 01/07/2020 1256   K 4.4 06/03/2020 0930   CL 102 06/03/2020 0930   CO2 27 06/03/2020 0930   BUN 14 06/03/2020 0930   BUN 10 01/07/2020 1256   CREATININE 0.58 06/03/2020 0930   CREATININE 0.60 05/27/2020 0859      Component Value Date/Time    CALCIUM 9.1 06/03/2020 0930   ALKPHOS 38 06/03/2020 0930   AST 17 06/03/2020 0930   AST 16 05/27/2020 0859   ALT 21 06/03/2020 0930   ALT 19 05/27/2020 0859   BILITOT 0.4 06/03/2020 0930   BILITOT 0.4 05/27/2020 0859       Impression and Plan: Angela Wilson is a very pleasant 54 yo female from [place] with metastatic pancreatic cancer.  She has tolerated treatment nicely so far and appears to have had a good response per her Tumor markers. CEA was down to 3.91, CA 125 in August was 45.  We will repeat CT scans to better assess her response in 2-3 weeks.  We will proceed with cycle 5 today as planned.  Follow-up  in 4 weeks.  They can contact our office with any questions or concerns.   Angela Peace, NP 9/27/202111:43 AM

## 2020-06-15 NOTE — Progress Notes (Deleted)
Hematology and Oncology Follow Up Visit  Angela Wilson [id number] 11/04/1965 54 y.o. 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 is here today for follow-up and treatment.    ECOG Performance Status: 1 - Symptomatic but completely ambulatory  Medications:  Allergies as of 06/15/2020      Reactions   Iodinated Diagnostic Agents Itching, Rash   Ivp Dye [iodinated Diagnostic Agents] Itching, Rash   Morphine And Related Hives   Penicillins Rash      Medication List       Accurate as of June 15, 2020 11:43 AM. If you have any questions, ask your nurse or doctor.        albuterol 108 (90 Base) MCG/ACT inhaler Commonly known as: VENTOLIN HFA Inhale 2 puffs into the lungs every 4 (four) hours as needed for wheezing or shortness of breath.   amLODipine 10 MG tablet Commonly known as: NORVASC Take 1 tablet (10 mg total) by mouth daily.   aspirin 81 MG chewable tablet Chew 81 mg by mouth daily.   cetirizine 10 MG tablet Commonly known as: ZYRTEC Take 1 tablet (10 mg total) by mouth daily.   Creon 36000 UNITS Cpep capsule Generic drug: lipase/protease/amylase   dronabinol 2.5 MG capsule Commonly known as: MARINOL Take 1 capsule (2.5 mg total) by mouth 2 (two) times daily before lunch and supper.   ergocalciferol 1.25 MG (50000 UT) capsule Commonly known as: VITAMIN D2 Take 50,000 Units by mouth once a week.   fentaNYL 50 MCG/HR Commonly known as: [place] 1 patch onto the skin every 3 (three) days.   glipiZIDE 10 MG tablet Commonly known as: GLUCOTROL Take 10 mg by mouth 2 (two) times daily.   HYDROcodone-homatropine 5-1.5 MG/5ML syrup Commonly known as: Hycodan Take 5 mLs by mouth every 6 (six) hours as needed for cough.     lidocaine-prilocaine cream Commonly known as: EMLA APPLY TOPICALLY ONCE FOR 1 DOSE   lisinopril 10 MG tablet Commonly known as: ZESTRIL Take 1 tablet (10 mg total) by mouth daily.   metFORMIN 500 MG 24 hr tablet Commonly known as: Glucophage XR Take 1 tablet (500 mg total) by mouth daily with breakfast.   metoCLOPramide 5 MG tablet Commonly known as: Reglan Take 1 tablet (5 mg total) by mouth 4 (four) times daily -  before meals and at bedtime.   omeprazole 40 MG capsule Commonly known as: PRILOSEC Take 1 capsule (40 mg total) by mouth daily.   ondansetron 4 MG tablet Commonly known as: ZOFRAN Take 1 tablet (4 mg total) by mouth every 6 (six) hours as needed for nausea.   Oxycodone HCl 10 MG Tabs Take 1 tablet (10 mg total) by mouth every 6 (six) hours as needed.   polyethylene glycol 17 g packet Commonly known as: MIRALAX / GLYCOLAX Take 17 g by mouth daily as needed.   pravastatin 40 MG tablet Commonly known as: PRAVACHOL Take 40 mg by mouth daily.   prochlorperazine 10 MG tablet Commonly known as: COMPAZINE Take 1 tablet (10 mg total) by mouth every 6 (six) hours as needed for nausea or vomiting.       Allergies:  Allergies  Allergen Reactions  . Iodinated Diagnostic Agents Itching and Rash  . Ivp Dye [Iodinated Diagnostic Agents] Itching and Rash  .  Morphine And Related Hives  . Penicillins Rash    Past Medical History, Surgical history, Social history, and Family History were reviewed and updated.  Review of Systems: All other 10 point review of systems is negative.   Physical Exam:  vitals were not taken for this visit.   Wt Readings from Last 3 Encounters:  05/19/20 210 lb 0.6 oz (95.3 kg)  05/19/20 210 lb 6.4 oz (95.4 kg)  04/21/20 207 lb 1.3 oz (93.9 kg)    Ocular: Sclerae unicteric, pupils equal, round and reactive to light Ear-nose-throat: Oropharynx clear, dentition fair Lymphatic: No cervical or supraclavicular adenopathy Lungs no  rales or rhonchi, good excursion bilaterally Heart regular rate and rhythm, no murmur appreciated Abd soft, nontender, positive bowel sounds, no liver or spleen tip palpated on exam, no fluid wave  MSK no focal spinal tenderness, no joint edema Neuro: non-focal, well-oriented, appropriate affect Breasts: Deferred   Lab Results  Component Value Date   WBC 3.7 (L) 06/15/2020   HGB 9.9 (L) 06/15/2020   HCT 32.5 (L) 06/15/2020   MCV 91.3 06/15/2020   PLT 248 06/15/2020   Lab Results  Component Value Date   FERRITIN 102 02/03/2020   IRON 23 (L) 02/03/2020   TIBC 325 02/03/2020   UIBC 302 02/03/2020   IRONPCTSAT 7 (L) 02/03/2020   Lab Results  Component Value Date   RETICCTPCT 5.7 (H) 06/15/2020   RBC 3.55 (L) 06/15/2020   No results found for: KPAFRELGTCHN, LAMBDASER, KAPLAMBRATIO No results found for: IGGSERUM, IGA, IGMSERUM No results found for: Odetta Pink, SPEI   Chemistry      Component Value Date/Time   NA 136 06/03/2020 0930   NA 136 01/07/2020 1256   K 4.4 06/03/2020 0930   CL 102 06/03/2020 0930   CO2 27 06/03/2020 0930   BUN 14 06/03/2020 0930   BUN 10 01/07/2020 1256   CREATININE 0.58 06/03/2020 0930   CREATININE 0.60 05/27/2020 0859      Component Value Date/Time   CALCIUM 9.1 06/03/2020 0930   ALKPHOS 38 06/03/2020 0930   AST 17 06/03/2020 0930   AST 16 05/27/2020 0859   ALT 21 06/03/2020 0930   ALT 19 05/27/2020 0859   BILITOT 0.4 06/03/2020 0930   BILITOT 0.4 05/27/2020 0859       Impression and Plan: [name] is a very pleasant 54 yo female from [place] with metastatic pancreatic cancer.    Laverna Peace, NP 9/27/202111:43 AM

## 2020-06-15 NOTE — Progress Notes (Signed)
Copy of email sent to [place] [employer] given to daughter, [place]. Copy of a previous letter also requested with current date.  Oncology Nurse Navigator Documentation  Oncology Nurse Navigator Flowsheets 06/15/2020  Confirmed Diagnosis Date -  Diagnosis Status -  Planned Course of Treatment -  Phase of Treatment -  Chemotherapy Pending- Reason: -  Chemotherapy Actual Start Date: -  Navigator Follow Up Date: 07/14/2020  Navigator Follow Up Reason: Follow-up Appointment;Chemotherapy  Navigator Location [facility]  Referral Date to RadOnc/MedOnc -  Navigator Encounter Type Treatment  Telephone -  Treatment Initiated Date -  Patient Visit Type MedOnc  Treatment Phase Active Tx  Barriers/Navigation Needs Coordination of Care;Cultural Needs;Family Concerns;Language/Communication  Education -  Interventions Psycho-Social Support;Other  Acuity Level 2-Minimal Needs (1-2 Barriers Identified)  Referrals -  Coordination of Care -  Education Method -  Support Groups/Services Friends and Family  Time Spent with Patient 15

## 2020-06-15 NOTE — Patient Instructions (Signed)
[facility] [facility] Discharge Instructions for Patients Receiving Chemotherapy  Today you received the following chemotherapy agents Gemzar, Paclitaxel.  To help prevent nausea and vomiting after your treatment, we encourage you to take your nausea medication as indicated by your MD.   If you develop nausea and vomiting that is not controlled by your nausea medication, call the clinic.   BELOW ARE SYMPTOMS THAT SHOULD BE REPORTED IMMEDIATELY:  *FEVER GREATER THAN 100.5 F  *CHILLS WITH OR WITHOUT FEVER  NAUSEA AND VOMITING THAT IS NOT CONTROLLED WITH YOUR NAUSEA MEDICATION  *UNUSUAL SHORTNESS OF BREATH  *UNUSUAL BRUISING OR BLEEDING  TENDERNESS IN MOUTH AND THROAT WITH OR WITHOUT PRESENCE OF ULCERS  *URINARY PROBLEMS  *BOWEL PROBLEMS  UNUSUAL RASH Items with * indicate a potential emergency and should be followed up as soon as possible.  Feel free to call the clinic should you have any questions or concerns. The clinic phone number is [phone].  Please show the [place] [facility] at check-in to the Emergency Department and triage nurse.

## 2020-06-15 NOTE — Patient Instructions (Signed)

## 2020-06-16 ENCOUNTER — Encounter: Payer: Self-pay | Admitting: *Deleted

## 2020-06-16 ENCOUNTER — Telehealth: Payer: Self-pay | Admitting: Family

## 2020-06-16 ENCOUNTER — Other Ambulatory Visit: Payer: Self-pay | Admitting: Family

## 2020-06-16 DIAGNOSIS — C252 Malignant neoplasm of tail of pancreas: Secondary | ICD-10-CM

## 2020-06-16 LAB — FERRITIN: Ferritin: 284 ng/mL (ref 11–307)

## 2020-06-16 LAB — IRON AND TIBC
Iron: 55 ug/dL (ref 41–142)
Saturation Ratios: 17 % — ABNORMAL LOW (ref 21–57)
TIBC: 323 ug/dL (ref 236–444)
UIBC: 268 ug/dL (ref 120–384)

## 2020-06-16 LAB — CEA (IN HOUSE-CHCC): CEA (CHCC-In House): 3.41 ng/mL (ref 0.00–5.00)

## 2020-06-16 LAB — ERYTHROPOIETIN: Erythropoietin: 33.2 m[IU]/mL — ABNORMAL HIGH (ref 2.6–18.5)

## 2020-06-16 LAB — CA 125: Cancer Antigen (CA) 125: 21.8 U/mL (ref 0.0–38.1)

## 2020-06-16 NOTE — Telephone Encounter (Signed)
Appointments scheduled calendar printed & mailed per 9/27 los 

## 2020-06-22 ENCOUNTER — Other Ambulatory Visit: Payer: Self-pay

## 2020-06-22 ENCOUNTER — Inpatient Hospital Stay: Payer: 59 | Attending: Hematology & Oncology

## 2020-06-22 ENCOUNTER — Inpatient Hospital Stay: Payer: 59

## 2020-06-22 VITALS — HR 86

## 2020-06-22 DIAGNOSIS — R110 Nausea: Secondary | ICD-10-CM | POA: Insufficient documentation

## 2020-06-22 DIAGNOSIS — G620 Drug-induced polyneuropathy: Secondary | ICD-10-CM | POA: Diagnosis not present

## 2020-06-22 DIAGNOSIS — Z79899 Other long term (current) drug therapy: Secondary | ICD-10-CM | POA: Diagnosis not present

## 2020-06-22 DIAGNOSIS — C779 Secondary and unspecified malignant neoplasm of lymph node, unspecified: Secondary | ICD-10-CM | POA: Insufficient documentation

## 2020-06-22 DIAGNOSIS — Z7984 Long term (current) use of oral hypoglycemic drugs: Secondary | ICD-10-CM | POA: Diagnosis not present

## 2020-06-22 DIAGNOSIS — T451X5A Adverse effect of antineoplastic and immunosuppressive drugs, initial encounter: Secondary | ICD-10-CM | POA: Diagnosis not present

## 2020-06-22 DIAGNOSIS — C252 Malignant neoplasm of tail of pancreas: Secondary | ICD-10-CM

## 2020-06-22 DIAGNOSIS — Z7982 Long term (current) use of aspirin: Secondary | ICD-10-CM | POA: Insufficient documentation

## 2020-06-22 DIAGNOSIS — J910 Malignant pleural effusion: Secondary | ICD-10-CM | POA: Insufficient documentation

## 2020-06-22 DIAGNOSIS — Z5111 Encounter for antineoplastic chemotherapy: Secondary | ICD-10-CM | POA: Insufficient documentation

## 2020-06-22 DIAGNOSIS — R109 Unspecified abdominal pain: Secondary | ICD-10-CM | POA: Diagnosis not present

## 2020-06-22 LAB — CBC WITH DIFFERENTIAL (CANCER CENTER ONLY)
Abs Immature Granulocytes: 0.3 10*3/uL — ABNORMAL HIGH (ref 0.00–0.07)
Basophils Absolute: 0 10*3/uL (ref 0.0–0.1)
Basophils Relative: 1 %
Eosinophils Absolute: 0 10*3/uL (ref 0.0–0.5)
Eosinophils Relative: 1 %
HCT: 30.1 % — ABNORMAL LOW (ref 36.0–46.0)
Hemoglobin: 9.4 g/dL — ABNORMAL LOW (ref 12.0–15.0)
Immature Granulocytes: 7 %
Lymphocytes Relative: 31 %
Lymphs Abs: 1.4 10*3/uL (ref 0.7–4.0)
MCH: 28.4 pg (ref 26.0–34.0)
MCHC: 31.2 g/dL (ref 30.0–36.0)
MCV: 90.9 fL (ref 80.0–100.0)
Monocytes Absolute: 0.4 10*3/uL (ref 0.1–1.0)
Monocytes Relative: 8 %
Neutro Abs: 2.3 10*3/uL (ref 1.7–7.7)
Neutrophils Relative %: 52 %
Platelet Count: 228 10*3/uL (ref 150–400)
RBC: 3.31 MIL/uL — ABNORMAL LOW (ref 3.87–5.11)
RDW: 17.1 % — ABNORMAL HIGH (ref 11.5–15.5)
WBC Count: 4.4 10*3/uL (ref 4.0–10.5)
nRBC: 0 % (ref 0.0–0.2)

## 2020-06-22 LAB — CMP (CANCER CENTER ONLY)
ALT: 26 U/L (ref 0–44)
AST: 24 U/L (ref 15–41)
Albumin: 3.5 g/dL (ref 3.5–5.0)
Alkaline Phosphatase: 40 U/L (ref 38–126)
Anion gap: 7 (ref 5–15)
BUN: 10 mg/dL (ref 6–20)
CO2: 25 mmol/L (ref 22–32)
Calcium: 9.1 mg/dL (ref 8.9–10.3)
Chloride: 103 mmol/L (ref 98–111)
Creatinine: 0.68 mg/dL (ref 0.44–1.00)
GFR, Est AFR Am: >60 mL/min (ref >60)
GFR, Estimated: >60 mL/min (ref >60)
Glucose, Bld: 174 mg/dL — ABNORMAL HIGH (ref 70–99)
Potassium: 4.2 mmol/L (ref 3.5–5.1)
Sodium: 135 mmol/L (ref 135–145)
Total Bilirubin: 0.4 mg/dL (ref 0.3–1.2)
Total Protein: 6.5 g/dL (ref 6.5–8.1)

## 2020-06-22 LAB — LACTATE DEHYDROGENASE: LDH: 228 U/L — ABNORMAL HIGH (ref 98–192)

## 2020-06-22 LAB — CEA (IN HOUSE-CHCC): CEA (CHCC-In House): 3.27 ng/mL (ref 0.00–5.00)

## 2020-06-22 MED ORDER — PALONOSETRON HCL INJECTION 0.25 MG/5ML
INTRAVENOUS | Status: AC
  Filled 2020-06-22: qty 5

## 2020-06-22 MED ORDER — SODIUM CHLORIDE 0.9% FLUSH
10.0000 mL | INTRAVENOUS | Status: DC | PRN
  Administered 2020-06-22: 10 mL
  Filled 2020-06-22: qty 10

## 2020-06-22 MED ORDER — SODIUM CHLORIDE 0.9 % IV SOLN
2000.0000 mg | Freq: Once | INTRAVENOUS | Status: AC
  Administered 2020-06-22: 2000 mg via INTRAVENOUS
  Filled 2020-06-22: qty 52.6

## 2020-06-22 MED ORDER — PALONOSETRON HCL INJECTION 0.25 MG/5ML
0.2500 mg | Freq: Once | INTRAVENOUS | Status: AC
  Administered 2020-06-22: 0.25 mg via INTRAVENOUS

## 2020-06-22 MED ORDER — SODIUM CHLORIDE 0.9 % IV SOLN
Freq: Once | INTRAVENOUS | Status: AC
  Filled 2020-06-22: qty 250

## 2020-06-22 MED ORDER — PACLITAXEL PROTEIN-BOUND CHEMO INJECTION 100 MG
100.0000 mg/m2 | Freq: Once | INTRAVENOUS | Status: AC
  Administered 2020-06-22: 200 mg via INTRAVENOUS
  Filled 2020-06-22: qty 40

## 2020-06-22 MED ORDER — HEPARIN SOD (PORK) LOCK FLUSH 100 UNIT/ML IV SOLN
500.0000 [IU] | Freq: Once | INTRAVENOUS | Status: AC | PRN
  Administered 2020-06-22: 500 [IU]
  Filled 2020-06-22: qty 5

## 2020-06-22 MED ORDER — SODIUM CHLORIDE 0.9 % IV SOLN
10.0000 mg | Freq: Once | INTRAVENOUS | Status: AC
  Administered 2020-06-22: 10 mg via INTRAVENOUS
  Filled 2020-06-22: qty 1

## 2020-06-22 NOTE — Patient Instructions (Signed)
[place] Discharge Instructions for Patients Receiving Chemotherapy  Today you received the following chemotherapy agents gemzar and Abraxane  To help prevent nausea and vomiting after your treatment, we encourage you to take your nausea medication as directed If you develop nausea and vomiting that is not controlled by your nausea medication, call the clinic.   BELOW ARE SYMPTOMS THAT SHOULD BE REPORTED IMMEDIATELY:  *FEVER GREATER THAN 100.5 F  *CHILLS WITH OR WITHOUT FEVER  NAUSEA AND VOMITING THAT IS NOT CONTROLLED WITH YOUR NAUSEA MEDICATION  *UNUSUAL SHORTNESS OF BREATH  *UNUSUAL BRUISING OR BLEEDING  TENDERNESS IN MOUTH AND THROAT WITH OR WITHOUT PRESENCE OF ULCERS  *URINARY PROBLEMS  *BOWEL PROBLEMS  UNUSUAL RASH Items with * indicate a potential emergency and should be followed up as soon as possible.  Feel free to call the clinic should you have any questions or concerns. The clinic phone number is [phone].  Please show the [place] at check-in to the Emergency Department and triage nurse.

## 2020-06-23 LAB — CA 125: Cancer Antigen (CA) 125: 18.7 U/mL (ref 0.0–38.1)

## 2020-06-26 ENCOUNTER — Other Ambulatory Visit: Payer: Self-pay | Admitting: *Deleted

## 2020-06-26 DIAGNOSIS — Z8041 Family history of malignant neoplasm of ovary: Secondary | ICD-10-CM

## 2020-06-26 DIAGNOSIS — C252 Malignant neoplasm of tail of pancreas: Secondary | ICD-10-CM

## 2020-06-26 DIAGNOSIS — Z803 Family history of malignant neoplasm of breast: Secondary | ICD-10-CM

## 2020-06-29 ENCOUNTER — Inpatient Hospital Stay: Payer: 59

## 2020-06-29 ENCOUNTER — Other Ambulatory Visit: Payer: Self-pay | Admitting: *Deleted

## 2020-06-29 ENCOUNTER — Encounter: Payer: Self-pay | Admitting: Hematology & Oncology

## 2020-06-29 ENCOUNTER — Other Ambulatory Visit: Payer: Self-pay

## 2020-06-29 ENCOUNTER — Inpatient Hospital Stay (HOSPITAL_BASED_OUTPATIENT_CLINIC_OR_DEPARTMENT_OTHER): Payer: 59 | Admitting: Hematology & Oncology

## 2020-06-29 VITALS — BP 108/67 | HR 106 | Temp 99.5°F | Resp 18

## 2020-06-29 DIAGNOSIS — C252 Malignant neoplasm of tail of pancreas: Secondary | ICD-10-CM

## 2020-06-29 DIAGNOSIS — C251 Malignant neoplasm of body of pancreas: Secondary | ICD-10-CM

## 2020-06-29 DIAGNOSIS — Z803 Family history of malignant neoplasm of breast: Secondary | ICD-10-CM

## 2020-06-29 DIAGNOSIS — N200 Calculus of kidney: Secondary | ICD-10-CM

## 2020-06-29 DIAGNOSIS — Z5111 Encounter for antineoplastic chemotherapy: Secondary | ICD-10-CM | POA: Diagnosis not present

## 2020-06-29 DIAGNOSIS — Z8041 Family history of malignant neoplasm of ovary: Secondary | ICD-10-CM

## 2020-06-29 LAB — CBC WITH DIFFERENTIAL (CANCER CENTER ONLY)
Abs Immature Granulocytes: 0.21 10*3/uL — ABNORMAL HIGH (ref 0.00–0.07)
Basophils Absolute: 0 10*3/uL (ref 0.0–0.1)
Basophils Relative: 1 %
Eosinophils Absolute: 0 10*3/uL (ref 0.0–0.5)
Eosinophils Relative: 1 %
HCT: 30.6 % — ABNORMAL LOW (ref 36.0–46.0)
Hemoglobin: 9.6 g/dL — ABNORMAL LOW (ref 12.0–15.0)
Immature Granulocytes: 7 %
Lymphocytes Relative: 35 %
Lymphs Abs: 1 10*3/uL (ref 0.7–4.0)
MCH: 28.3 pg (ref 26.0–34.0)
MCHC: 31.4 g/dL (ref 30.0–36.0)
MCV: 90.3 fL (ref 80.0–100.0)
Monocytes Absolute: 0.3 10*3/uL (ref 0.1–1.0)
Monocytes Relative: 9 %
Neutro Abs: 1.4 10*3/uL — ABNORMAL LOW (ref 1.7–7.7)
Neutrophils Relative %: 47 %
Platelet Count: 123 10*3/uL — ABNORMAL LOW (ref 150–400)
RBC: 3.39 MIL/uL — ABNORMAL LOW (ref 3.87–5.11)
RDW: 17.2 % — ABNORMAL HIGH (ref 11.5–15.5)
WBC Count: 3 10*3/uL — ABNORMAL LOW (ref 4.0–10.5)
nRBC: 1 % — ABNORMAL HIGH (ref 0.0–0.2)

## 2020-06-29 LAB — URINALYSIS, COMPLETE (UACMP) WITH MICROSCOPIC
Bilirubin Urine: NEGATIVE
Glucose, UA: NEGATIVE mg/dL
Hgb urine dipstick: NEGATIVE
Ketones, ur: NEGATIVE mg/dL
Leukocytes,Ua: NEGATIVE
Nitrite: NEGATIVE
Protein, ur: NEGATIVE mg/dL
Specific Gravity, Urine: 1.02 (ref 1.005–1.030)
pH: 6.5 (ref 5.0–8.0)

## 2020-06-29 LAB — CMP (CANCER CENTER ONLY)
ALT: 22 U/L (ref 0–44)
AST: 21 U/L (ref 15–41)
Albumin: 3.5 g/dL (ref 3.5–5.0)
Alkaline Phosphatase: 36 U/L — ABNORMAL LOW (ref 38–126)
Anion gap: 8 (ref 5–15)
BUN: 12 mg/dL (ref 6–20)
CO2: 25 mmol/L (ref 22–32)
Calcium: 9.5 mg/dL (ref 8.9–10.3)
Chloride: 103 mmol/L (ref 98–111)
Creatinine: 0.65 mg/dL (ref 0.44–1.00)
GFR, Estimated: >60 mL/min (ref >60)
Glucose, Bld: 188 mg/dL — ABNORMAL HIGH (ref 70–99)
Potassium: 4.1 mmol/L (ref 3.5–5.1)
Sodium: 136 mmol/L (ref 135–145)
Total Bilirubin: 0.5 mg/dL (ref 0.3–1.2)
Total Protein: 6.3 g/dL — ABNORMAL LOW (ref 6.5–8.1)

## 2020-06-29 MED ORDER — SODIUM CHLORIDE 0.9 % IV SOLN
Freq: Once | INTRAVENOUS | Status: AC
  Filled 2020-06-29: qty 250

## 2020-06-29 MED ORDER — SODIUM CHLORIDE 0.9% FLUSH
10.0000 mL | INTRAVENOUS | Status: DC | PRN
  Administered 2020-06-29: 10 mL
  Filled 2020-06-29: qty 10

## 2020-06-29 MED ORDER — INFLUENZA VAC SPLIT QUAD 0.5 ML IM SUSY
PREFILLED_SYRINGE | INTRAMUSCULAR | Status: AC
  Filled 2020-06-29: qty 0.5

## 2020-06-29 MED ORDER — HEPARIN SOD (PORK) LOCK FLUSH 100 UNIT/ML IV SOLN
500.0000 [IU] | Freq: Once | INTRAVENOUS | Status: AC | PRN
  Administered 2020-06-29: 500 [IU]
  Filled 2020-06-29: qty 5

## 2020-06-29 MED ORDER — SODIUM CHLORIDE 0.9 % IV SOLN
10.0000 mg | Freq: Once | INTRAVENOUS | Status: AC
  Administered 2020-06-29: 10 mg via INTRAVENOUS
  Filled 2020-06-29: qty 1

## 2020-06-29 MED ORDER — PALONOSETRON HCL INJECTION 0.25 MG/5ML
0.2500 mg | Freq: Once | INTRAVENOUS | Status: AC
  Administered 2020-06-29: 0.25 mg via INTRAVENOUS

## 2020-06-29 MED ORDER — VITAMIN B-6 250 MG PO TABS: 250.0000 mg | ORAL_TABLET | Freq: Every day | ORAL | 6 refills | Status: DC

## 2020-06-29 MED ORDER — PACLITAXEL PROTEIN-BOUND CHEMO INJECTION 100 MG
100.0000 mg/m2 | Freq: Once | INTRAVENOUS | Status: AC
  Administered 2020-06-29: 200 mg via INTRAVENOUS
  Filled 2020-06-29: qty 40

## 2020-06-29 MED ORDER — PALONOSETRON HCL INJECTION 0.25 MG/5ML
INTRAVENOUS | Status: AC
  Filled 2020-06-29: qty 5

## 2020-06-29 MED ORDER — SODIUM CHLORIDE 0.9 % IV SOLN
2000.0000 mg | Freq: Once | INTRAVENOUS | Status: AC
  Administered 2020-06-29: 2000 mg via INTRAVENOUS
  Filled 2020-06-29: qty 52.6

## 2020-06-29 NOTE — Progress Notes (Signed)
Pt. Accompanied by daughter who interprets for her. 

## 2020-06-29 NOTE — Addendum Note (Signed)
Addended by: Burney Gauze R on: 06/29/2020 12:23 PM   Modules accepted: Orders

## 2020-06-29 NOTE — Progress Notes (Signed)
Okay to treat today with ANC 1.4 per Dr. Marin Olp.

## 2020-06-29 NOTE — Patient Instructions (Signed)
[facility] Discharge Instructions for Patients Receiving Chemotherapy  Today you received the following chemotherapy agents gemzar and Abraxane  To help prevent nausea and vomiting after your treatment, we encourage you to take your nausea medication as directed If you develop nausea and vomiting that is not controlled by your nausea medication, call the clinic.   BELOW ARE SYMPTOMS THAT SHOULD BE REPORTED IMMEDIATELY:  *FEVER GREATER THAN 100.5 F  *CHILLS WITH OR WITHOUT FEVER  NAUSEA AND VOMITING THAT IS NOT CONTROLLED WITH YOUR NAUSEA MEDICATION  *UNUSUAL SHORTNESS OF BREATH  *UNUSUAL BRUISING OR BLEEDING  TENDERNESS IN MOUTH AND THROAT WITH OR WITHOUT PRESENCE OF ULCERS  *URINARY PROBLEMS  *BOWEL PROBLEMS  UNUSUAL RASH Items with * indicate a potential emergency and should be followed up as soon as possible.  Feel free to call the clinic should you have any questions or concerns. The clinic phone number is [phone].  Please show the [place] at check-in to the Emergency Department and triage nurse.

## 2020-06-29 NOTE — Patient Instructions (Signed)

## 2020-06-29 NOTE — Progress Notes (Signed)
Hematology and Oncology Follow Up Visit  Angela Wilson [id number] 01/08/1966 54 y.o. 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 is back for follow-up.  As usual, she seems to be doing pretty well.  She is having some issues with neuropathy.  I think that is probably is from the Abraxane I am surprised e.  I will try her on some vitamin B6.  We could always consider some Neurontin for her.    Her last tumor markers were basically normal.  The CA-125 was down to 18.7.  The CEA was down to 3.3.  She is having some abdominal discomfort.  Is in the upper abdomen not in the periumbilical area.  She has had no bleeding.  She has had some bouts of diarrhea.  Some over-the-counter medicine seems to help.  She is supposed be due for a CT scan.  I am not sure if there are any problems with her try to get it.  I told her daughter to go downstairs to our imaging center on the first floor and make the appointment herself.  The big goal with [name] is for her to go to [place] to see a grandchild.  Grandchild is supposed be born in December.  She wants to be there for about 6 weeks or so.  Again, quality of life is our primary goal.  I think we can get her to [place] and back safely.  Currently, her performance status is ECOG 1.     Medications:  Current Outpatient Medications:  .  albuterol (PROVENTIL HFA;VENTOLIN HFA) 108 (90 Base) MCG/ACT inhaler, Inhale 2 puffs into the lungs every 4 (four) hours as needed for wheezing or shortness of breath., Disp: 1 Inhaler, Rfl: 0 .  amLODipine (NORVASC) 10 MG tablet, Take 1 tablet (10 mg total) by mouth daily. (Patient not taking: Reported on 06/15/2020), Disp: 30 tablet, Rfl: 3 .  aspirin 81 MG chewable tablet, Chew 81 mg by  mouth daily. , Disp: , Rfl:  .  cetirizine (ZYRTEC) 10 MG tablet, Take 1 tablet (10 mg total) by mouth daily., Disp: 30 tablet, Rfl: 3 .  CREON 36000-114000 units CPEP capsule, , Disp: , Rfl:  .  dronabinol (MARINOL) 2.5 MG capsule, Take 1 capsule (2.5 mg total) by mouth 2 (two) times daily before lunch and supper., Disp: 60 capsule, Rfl: 0 .  ergocalciferol (VITAMIN D2) 1.25 MG (50000 UT) capsule, Take 50,000 Units by mouth once a week. , Disp: , Rfl:  .  fentaNYL (DURAGESIC) 50 MCG/HR, Place 1 patch onto the skin every 3 (three) days., Disp: 10 patch, Rfl: 0 .  glipiZIDE (GLUCOTROL) 10 MG tablet, Take 10 mg by mouth 2 (two) times daily., Disp: , Rfl:  .  HYDROcodone-homatropine (HYCODAN) 5-1.5 MG/5ML syrup, Take 5 mLs by mouth every 6 (six) hours as needed for cough., Disp: 120 mL, Rfl: 0 .  lidocaine-prilocaine (EMLA) cream, APPLY TOPICALLY ONCE FOR 1 DOSE, Disp: , Rfl:  .  lisinopril (ZESTRIL) 10 MG tablet, Take 1 tablet (10 mg total) by mouth daily., Disp: 30 tablet, Rfl: 3 .  metFORMIN (GLUCOPHAGE XR) 500 MG 24 hr tablet, Take 1 tablet (500 mg total) by mouth daily with breakfast., Disp: 30 tablet, Rfl: 3 .  metoCLOPramide (REGLAN) 5 MG tablet, Take  1 tablet (5 mg total) by mouth 4 (four) times daily -  before meals and at bedtime., Disp: 45 tablet, Rfl: 3 .  omeprazole (PRILOSEC) 40 MG capsule, Take 1 capsule (40 mg total) by mouth daily., Disp: 30 capsule, Rfl: 3 .  ondansetron (ZOFRAN) 4 MG tablet, Take 1 tablet (4 mg total) by mouth every 6 (six) hours as needed for nausea., Disp: 20 tablet, Rfl: 0 .  Oxycodone HCl 10 MG TABS, Take 1 tablet (10 mg total) by mouth every 6 (six) hours as needed., Disp: 90 tablet, Rfl: 0 .  polyethylene glycol (MIRALAX / GLYCOLAX) 17 g packet, Take 17 g by mouth daily as needed., Disp: 14 each, Rfl: 0 .  pravastatin (PRAVACHOL) 40 MG tablet, Take 40 mg by mouth daily., Disp: , Rfl:  .  prochlorperazine (COMPAZINE) 10 MG tablet, Take 1 tablet (10 mg total) by  mouth every 6 (six) hours as needed for nausea or vomiting., Disp: 90 tablet, Rfl: 3 No current facility-administered medications for this visit.  Facility-Administered Medications Ordered in Other Visits:  .  gemcitabine (GEMZAR) 2,000 mg in sodium chloride 0.9 % 250 mL chemo infusion, 2,000 mg, Intravenous, Once, Angela Wilson R, MD .  heparin lock flush 100 unit/mL, 500 Units, Intracatheter, Once PRN, Angela Wilson, Angela Cobb, MD .  PACLitaxel-protein bound (ABRAXANE) chemo infusion 200 mg, 100 mg/m2 (Treatment Plan Recorded), Intravenous, Once, Angela Wilson R, MD .  sodium chloride flush (NS) 0.9 % injection 10 mL, 10 mL, Intracatheter, PRN, Angela Napoleon, MD  Allergies:  Allergies  Allergen Reactions  . Iodinated Diagnostic Agents Itching and Rash  . Ivp Dye [Iodinated Diagnostic Agents] Itching and Rash  . Morphine And Related Hives  . Penicillins Rash    Past Medical History, Surgical history, Social history, and Family History were reviewed and updated.  Review of Systems: Review of Systems  Constitutional: Negative.   HENT:  Negative.   Eyes: Negative.   Respiratory: Negative.   Cardiovascular: Negative.   Gastrointestinal: Positive for abdominal pain and nausea.  Endocrine: Negative.   Genitourinary: Negative.    Musculoskeletal: Negative.   Skin: Negative.   Neurological: Negative.   Hematological: Negative.   Psychiatric/Behavioral: Negative.     Physical Exam:  vitals were not taken for this visit.   Wt Readings from Last 3 Encounters:  06/22/20 213 lb (96.6 kg)  06/15/20 212 lb (96.2 kg)  05/19/20 210 lb 0.6 oz (95.3 kg)   Her vital signs show a temperature of 97.1.  Pulse is 100.  Blood pressure 116/70.  Weight is 230 pounds.  Physical Exam Vitals reviewed.  HENT:     Head: Normocephalic and atraumatic.  Eyes:     Pupils: Pupils are equal, round, and reactive to light.  Cardiovascular:     Rate and Rhythm: Normal rate and regular rhythm.     Heart  sounds: Normal heart sounds.  Pulmonary:     Effort: Pulmonary effort is normal.     Breath sounds: Normal breath sounds.  Abdominal:     General: Bowel sounds are normal.     Palpations: Abdomen is soft.  Musculoskeletal:        General: No tenderness or deformity. Normal range of motion.     Cervical back: Normal range of motion.  Lymphadenopathy:     Cervical: No cervical adenopathy.  Skin:    General: Skin is warm and dry.     Findings: No erythema or rash.  Neurological:  Mental Status: She is alert and oriented to person, place, and time.  Psychiatric:        Behavior: Behavior normal.        Thought Content: Thought content normal.        Judgment: Judgment normal.      Lab Results  Component Value Date   WBC 3.0 (L) 06/29/2020   HGB 9.6 (L) 06/29/2020   HCT 30.6 (L) 06/29/2020   MCV 90.3 06/29/2020   PLT 123 (L) 06/29/2020     Chemistry      Component Value Date/Time   NA 136 06/29/2020 0908   NA 136 01/07/2020 1256   K 4.1 06/29/2020 0908   CL 103 06/29/2020 0908   CO2 25 06/29/2020 0908   BUN 12 06/29/2020 0908   BUN 10 01/07/2020 1256   CREATININE 0.65 06/29/2020 0908      Component Value Date/Time   CALCIUM 9.5 06/29/2020 0908   ALKPHOS 36 (L) 06/29/2020 0908   AST 21 06/29/2020 0908   ALT 22 06/29/2020 0908   BILITOT 0.5 06/29/2020 0908       Impression and Plan: [name] is a very nice 54 year old woman from [place].  She has metastatic pancreatic cancer.  By the tumor markers, she seems to be responding.  Hopefully, we will see this on her CT scan.  She really, really wants to get over to [place].  This is very important for her.  We will do everything that we can to get her over there.  This is day 8 of cycle 4 treatment.  I will plan to do the CT scan in a week or so.  I will then plan to get her back the first week in November for her, hopefully, fifth cycle of treatment before she heads across to [place].        Angela Napoleon, MD 10/11/202112:06 PM

## 2020-06-30 LAB — URINE CULTURE: Culture: <10000 — AB

## 2020-07-01 ENCOUNTER — Ambulatory Visit (HOSPITAL_BASED_OUTPATIENT_CLINIC_OR_DEPARTMENT_OTHER)
Admission: RE | Admit: 2020-07-01 | Discharge: 2020-07-01 | Disposition: A | Discharge status: Home / Self Care | Payer: 59 | Source: Ambulatory Visit | Attending: Family | Admitting: Family

## 2020-07-01 ENCOUNTER — Other Ambulatory Visit: Payer: Self-pay

## 2020-07-01 DIAGNOSIS — C252 Malignant neoplasm of tail of pancreas: Secondary | ICD-10-CM | POA: Insufficient documentation

## 2020-07-02 ENCOUNTER — Encounter: Payer: Self-pay | Admitting: *Deleted

## 2020-07-02 NOTE — Progress Notes (Signed)
Spoke to patient's daughter, Rehab about her scan. Message regarding Dr Antonieta Pert thoughts on scan results sent to patient via [facility], but Rehab had further questions and needs for clarification. Answered her questions to her satisfaction.   Oncology Nurse Navigator Documentation  Oncology Nurse Navigator Flowsheets 07/02/2020  Confirmed Diagnosis Date -  Diagnosis Status -  Planned Course of Treatment -  Phase of Treatment -  Chemotherapy Pending- Reason: -  Chemotherapy Actual Start Date: -  Navigator Follow Up Date: 07/21/2020  Navigator Follow Up Reason: Follow-up Appointment;Chemotherapy  Navigator Location [facility]  Referral Date to RadOnc/MedOnc -  Navigator Encounter Type Telephone  Telephone Outgoing Call;Diagnostic Results  Treatment Initiated Date -  Patient Visit Type MedOnc  Treatment Phase Active Tx  Barriers/Navigation Needs Coordination of Care;Cultural Needs;Family Concerns;Language/Communication  Education Other  Interventions Education;Psycho-Social Support  Acuity Level 2-Minimal Needs (1-2 Barriers Identified)  Referrals -  Coordination of Care -  Education Method Verbal  Support Groups/Services Friends and Family  Time Spent with Patient 30

## 2020-07-03 ENCOUNTER — Other Ambulatory Visit: Payer: Self-pay | Admitting: *Deleted

## 2020-07-03 ENCOUNTER — Encounter: Payer: Self-pay | Admitting: *Deleted

## 2020-07-03 NOTE — Progress Notes (Signed)
See MyChart communication.  Oncology Nurse Navigator Documentation  Oncology Nurse Navigator Flowsheets 07/03/2020  Confirmed Diagnosis Date -  Diagnosis Status -  Planned Course of Treatment -  Phase of Treatment -  Chemotherapy Pending- Reason: -  Chemotherapy Actual Start Date: -  Navigator Follow Up Date: 07/21/2020  Navigator Follow Up Reason: Follow-up Appointment  Navigator Location [facility]  Referral Date to RadOnc/MedOnc -  Navigator Encounter Type MyChart  Telephone Symptom Mgt  Treatment Initiated Date -  Patient Visit Type MedOnc  Treatment Phase Active Tx  Barriers/Navigation Needs Coordination of Care;Cultural Needs;Family Concerns;Language/Communication  Education Pain/ Symptom Management  Interventions Education  Acuity Level 2-Minimal Needs (1-2 Barriers Identified)  Referrals -  Coordination of Care -  Education Method Written  Support Groups/Services Friends and Family  Time Spent with Patient 33

## 2020-07-07 ENCOUNTER — Encounter: Payer: Self-pay | Admitting: *Deleted

## 2020-07-14 ENCOUNTER — Ambulatory Visit: Payer: 59

## 2020-07-14 ENCOUNTER — Inpatient Hospital Stay: Payer: 59

## 2020-07-14 ENCOUNTER — Other Ambulatory Visit: Payer: 59

## 2020-07-14 ENCOUNTER — Ambulatory Visit: Payer: 59 | Admitting: Hematology & Oncology

## 2020-07-20 ENCOUNTER — Other Ambulatory Visit: Payer: Self-pay | Admitting: *Deleted

## 2020-07-20 DIAGNOSIS — C251 Malignant neoplasm of body of pancreas: Secondary | ICD-10-CM

## 2020-07-21 ENCOUNTER — Inpatient Hospital Stay: Payer: 59

## 2020-07-21 ENCOUNTER — Encounter: Payer: Self-pay | Admitting: *Deleted

## 2020-07-21 ENCOUNTER — Other Ambulatory Visit: Payer: Self-pay | Admitting: Hematology & Oncology

## 2020-07-21 ENCOUNTER — Inpatient Hospital Stay (HOSPITAL_BASED_OUTPATIENT_CLINIC_OR_DEPARTMENT_OTHER): Payer: 59 | Admitting: Hematology & Oncology

## 2020-07-21 ENCOUNTER — Other Ambulatory Visit: Payer: Self-pay | Admitting: Oncology

## 2020-07-21 ENCOUNTER — Encounter: Payer: Self-pay | Admitting: Hematology & Oncology

## 2020-07-21 ENCOUNTER — Other Ambulatory Visit: Payer: Self-pay

## 2020-07-21 ENCOUNTER — Inpatient Hospital Stay: Payer: 59 | Attending: Hematology & Oncology

## 2020-07-21 VITALS — BP 119/77 | HR 108 | Temp 98.0°F | Resp 18 | Wt 217.2 lb

## 2020-07-21 DIAGNOSIS — Z5111 Encounter for antineoplastic chemotherapy: Secondary | ICD-10-CM | POA: Diagnosis present

## 2020-07-21 DIAGNOSIS — C252 Malignant neoplasm of tail of pancreas: Secondary | ICD-10-CM | POA: Diagnosis not present

## 2020-07-21 DIAGNOSIS — J910 Malignant pleural effusion: Secondary | ICD-10-CM | POA: Diagnosis not present

## 2020-07-21 DIAGNOSIS — G893 Neoplasm related pain (acute) (chronic): Secondary | ICD-10-CM | POA: Insufficient documentation

## 2020-07-21 DIAGNOSIS — R110 Nausea: Secondary | ICD-10-CM | POA: Diagnosis not present

## 2020-07-21 DIAGNOSIS — C251 Malignant neoplasm of body of pancreas: Secondary | ICD-10-CM

## 2020-07-21 LAB — CBC WITH DIFFERENTIAL (CANCER CENTER ONLY)
Abs Immature Granulocytes: 0.07 10*3/uL (ref 0.00–0.07)
Basophils Absolute: 0 10*3/uL (ref 0.0–0.1)
Basophils Relative: 1 %
Eosinophils Absolute: 0.1 10*3/uL (ref 0.0–0.5)
Eosinophils Relative: 2 %
HCT: 36.6 % (ref 36.0–46.0)
Hemoglobin: 11 g/dL — ABNORMAL LOW (ref 12.0–15.0)
Immature Granulocytes: 1 %
Lymphocytes Relative: 23 %
Lymphs Abs: 1.7 10*3/uL (ref 0.7–4.0)
MCH: 27.5 pg (ref 26.0–34.0)
MCHC: 30.1 g/dL (ref 30.0–36.0)
MCV: 91.5 fL (ref 80.0–100.0)
Monocytes Absolute: 0.5 10*3/uL (ref 0.1–1.0)
Monocytes Relative: 7 %
Neutro Abs: 4.8 10*3/uL (ref 1.7–7.7)
Neutrophils Relative %: 66 %
Platelet Count: 275 10*3/uL (ref 150–400)
RBC: 4 MIL/uL (ref 3.87–5.11)
RDW: 17.2 % — ABNORMAL HIGH (ref 11.5–15.5)
WBC Count: 7.2 10*3/uL (ref 4.0–10.5)
nRBC: 0 % (ref 0.0–0.2)

## 2020-07-21 LAB — COMPREHENSIVE METABOLIC PANEL
ALT: 11 U/L (ref 0–44)
AST: 15 U/L (ref 15–41)
Albumin: 3.7 g/dL (ref 3.5–5.0)
Alkaline Phosphatase: 39 U/L (ref 38–126)
Anion gap: 9 (ref 5–15)
BUN: 14 mg/dL (ref 6–20)
CO2: 24 mmol/L (ref 22–32)
Calcium: 9.6 mg/dL (ref 8.9–10.3)
Chloride: 105 mmol/L (ref 98–111)
Creatinine, Ser: 0.58 mg/dL (ref 0.44–1.00)
GFR, Estimated: >60 mL/min (ref >60)
Glucose, Bld: 156 mg/dL — ABNORMAL HIGH (ref 70–99)
Potassium: 4.3 mmol/L (ref 3.5–5.1)
Sodium: 138 mmol/L (ref 135–145)
Total Bilirubin: 0.5 mg/dL (ref 0.3–1.2)
Total Protein: 7 g/dL (ref 6.5–8.1)

## 2020-07-21 LAB — CEA (IN HOUSE-CHCC): CEA (CHCC-In House): 2.42 ng/mL (ref 0.00–5.00)

## 2020-07-21 MED ORDER — SODIUM CHLORIDE 0.9 % IV SOLN
2000.0000 mg | Freq: Once | INTRAVENOUS | Status: AC
  Administered 2020-07-21: 2000 mg via INTRAVENOUS
  Filled 2020-07-21: qty 52.6

## 2020-07-21 MED ORDER — SODIUM CHLORIDE 0.9% FLUSH
10.0000 mL | INTRAVENOUS | Status: DC | PRN
  Administered 2020-07-21: 10 mL
  Filled 2020-07-21: qty 10

## 2020-07-21 MED ORDER — OXYCODONE HCL 10 MG PO TABS: 10.0000 mg | ORAL_TABLET | Freq: Four times a day (QID) | ORAL | 0 refills | Status: DC | PRN

## 2020-07-21 MED ORDER — HEPARIN SOD (PORK) LOCK FLUSH 100 UNIT/ML IV SOLN
500.0000 [IU] | Freq: Once | INTRAVENOUS | Status: AC | PRN
  Administered 2020-07-21: 500 [IU]
  Filled 2020-07-21: qty 5

## 2020-07-21 MED ORDER — PALONOSETRON HCL INJECTION 0.25 MG/5ML
INTRAVENOUS | Status: AC
  Filled 2020-07-21: qty 5

## 2020-07-21 MED ORDER — PACLITAXEL PROTEIN-BOUND CHEMO INJECTION 100 MG
100.0000 mg/m2 | Freq: Once | INTRAVENOUS | Status: AC
  Administered 2020-07-21: 200 mg via INTRAVENOUS
  Filled 2020-07-21: qty 40

## 2020-07-21 MED ORDER — PALONOSETRON HCL INJECTION 0.25 MG/5ML
0.2500 mg | Freq: Once | INTRAVENOUS | Status: AC
  Administered 2020-07-21: 0.25 mg via INTRAVENOUS

## 2020-07-21 MED ORDER — RIVAROXABAN 10 MG PO TABS: 10.0000 mg | ORAL_TABLET | Freq: Every day | ORAL | 0 refills | Status: DC

## 2020-07-21 MED ORDER — LIDOCAINE-PRILOCAINE 2.5-2.5 % EX CREA
TOPICAL_CREAM | CUTANEOUS | 3 refills | Status: DC
Start: 2020-07-21 — End: 2020-12-08

## 2020-07-21 MED ORDER — LIDOCAINE-PRILOCAINE 2.5-2.5 % EX CREA
TOPICAL_CREAM | CUTANEOUS | Status: AC
  Filled 2020-07-21: qty 30

## 2020-07-21 MED ORDER — SODIUM CHLORIDE 0.9 % IV SOLN
Freq: Once | INTRAVENOUS | Status: AC
  Filled 2020-07-21: qty 250

## 2020-07-21 MED ORDER — SODIUM CHLORIDE 0.9 % IV SOLN
10.0000 mg | Freq: Once | INTRAVENOUS | Status: AC
  Administered 2020-07-21: 10 mg via INTRAVENOUS
  Filled 2020-07-21: qty 10

## 2020-07-21 MED FILL — XARELTO 10 MG TABLET: 10 | 60 days supply | Qty: 60 | Fill #0

## 2020-07-21 MED FILL — LIDOCAINE-PRILOCAINE CREAM: 2.5-2.5 | 30 days supply | Qty: 30 | Fill #0

## 2020-07-21 MED FILL — oxyCODONE HCL 10 MG TABS: 10 | 23 days supply | Qty: 90 | Fill #0

## 2020-07-21 NOTE — Patient Instructions (Signed)

## 2020-07-21 NOTE — Patient Instructions (Signed)
[facility] Discharge Instructions for Patients Receiving Chemotherapy  Today you received the following chemotherapy agents Abraxane/Gemzar To help prevent nausea and vomiting after your treatment, we encourage you to take your nausea medication as prescribed.  If you develop nausea and vomiting that is not controlled by your nausea medication, call the clinic.   BELOW ARE SYMPTOMS THAT SHOULD BE REPORTED IMMEDIATELY:  *FEVER GREATER THAN 100.5 F  *CHILLS WITH OR WITHOUT FEVER  NAUSEA AND VOMITING THAT IS NOT CONTROLLED WITH YOUR NAUSEA MEDICATION  *UNUSUAL SHORTNESS OF BREATH  *UNUSUAL BRUISING OR BLEEDING  TENDERNESS IN MOUTH AND THROAT WITH OR WITHOUT PRESENCE OF ULCERS  *URINARY PROBLEMS  *BOWEL PROBLEMS  UNUSUAL RASH Items with * indicate a potential emergency and should be followed up as soon as possible.  Feel free to call the clinic should you have any questions or concerns. The clinic phone number is [phone].  Please show the [facility] at check-in to the Emergency Department and triage nurse.

## 2020-07-21 NOTE — Progress Notes (Signed)
Oncology Nurse Navigator Documentation  Oncology Nurse Navigator Flowsheets 07/21/2020  Confirmed Diagnosis Date -  Diagnosis Status -  Planned Course of Treatment -  Phase of Treatment -  Chemotherapy Pending- Reason: -  Chemotherapy Actual Start Date: -  Navigator Follow Up Date: 07/28/2020  Navigator Follow Up Reason: Follow-up Appointment  Navigator Location [facility]  Referral Date to RadOnc/MedOnc -  Navigator Encounter Type Treatment  Telephone -  Treatment Initiated Date -  Patient Visit Type MedOnc  Treatment Phase Active Tx  Barriers/Navigation Needs Coordination of Care;Cultural Needs;Family Concerns;Language/Communication  Education -  Interventions Psycho-Social Support  Acuity Level 2-Minimal Needs (1-2 Barriers Identified)  Referrals -  Coordination of Care -  Education Method -  Support Groups/Services Friends and Family  Time Spent with Patient 15

## 2020-07-21 NOTE — Progress Notes (Signed)
Okay to treat with HR 108 per Dr. Marin Olp.

## 2020-07-21 NOTE — Progress Notes (Signed)
Hematology and Oncology Follow Up Visit  Angela Wilson [id number] 07-16-1966 54 y.o. 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  As usual, she seems to be doing pretty well.  We did do a CT scan on her a couple weeks ago.  Thankfully, the CT scan did not show any evidence of cancer progression.  I am this very happy regarding this.  Her last tumor markers were basically normal.  The CA-125 was down to 18.7.  The CEA was down to 3.3.  She is having some abdominal discomfort.  This is in the upper abdomen not in the periumbilical area.  She has had no bleeding.  She has had some bouts of diarrhea.  Some over-the-counter medicine seems to help.  She is planning on going over to [place] this month.  She wants to go over with her son who is over in [place] for right now.  He may be going back next week.  They will let me know if he is going back next week or later in November.  Her appetite is doing okay.  I really want her on some low-dose anticoagulation because of her long flight over to [place].  She just does not all that active.  I do think that Xarelto 10 mg daily would not be a bad idea for her.  She has had no rashes.  She has had no headache.  There is no cough.  She has had no chest wall pain.    Currently, her performance status is ECOG 1.     Medications:  Current Outpatient Medications:  .  aspirin 81 MG chewable tablet, Chew 81 mg by mouth daily. , Disp: , Rfl:  .  cetirizine (ZYRTEC) 10 MG tablet, Take 1 tablet (10 mg total) by mouth daily., Disp: 30 tablet, Rfl: 3 .  CREON 36000-114000 units CPEP capsule, , Disp: , Rfl:  .  dronabinol (MARINOL) 2.5 MG capsule, Take 1 capsule (2.5 mg total) by mouth 2 (two) times daily before lunch  and supper., Disp: 60 capsule, Rfl: 0 .  ergocalciferol (VITAMIN D2) 1.25 MG (50000 UT) capsule, Take 50,000 Units by mouth once a week. , Disp: , Rfl:  .  fentaNYL (DURAGESIC) 50 MCG/HR, Place 1 patch onto the skin every 3 (three) days., Disp: 10 patch, Rfl: 0 .  glipiZIDE (GLUCOTROL) 10 MG tablet, Take 10 mg by mouth 2 (two) times daily., Disp: , Rfl:  .  HYDROcodone-homatropine (HYCODAN) 5-1.5 MG/5ML syrup, Take 5 mLs by mouth every 6 (six) hours as needed for cough., Disp: 120 mL, Rfl: 0 .  lidocaine-prilocaine (EMLA) cream, APPLY TOPICALLY ONCE FOR 1 DOSE, Disp: , Rfl:  .  lisinopril (ZESTRIL) 10 MG tablet, Take 1 tablet (10 mg total) by mouth daily., Disp: 30 tablet, Rfl: 3 .  metFORMIN (GLUCOPHAGE XR) 500 MG 24 hr tablet, Take 1 tablet (500 mg total) by mouth daily with breakfast., Disp: 30 tablet, Rfl: 3 .  omeprazole (PRILOSEC) 40 MG capsule, Take 1 capsule (40 mg total) by mouth daily., Disp: 30 capsule, Rfl: 3 .  ondansetron (ZOFRAN) 4 MG tablet, Take 1 tablet (4 mg total) by mouth every 6 (six) hours as needed for nausea., Disp: 20 tablet, Rfl: 0 .  Oxycodone HCl 10 MG  TABS, Take 1 tablet (10 mg total) by mouth every 6 (six) hours as needed., Disp: 90 tablet, Rfl: 0 .  polyethylene glycol (MIRALAX / GLYCOLAX) 17 g packet, Take 17 g by mouth daily as needed., Disp: 14 each, Rfl: 0 .  pravastatin (PRAVACHOL) 40 MG tablet, Take 40 mg by mouth daily., Disp: , Rfl:  .  prochlorperazine (COMPAZINE) 10 MG tablet, Take 1 tablet (10 mg total) by mouth every 6 (six) hours as needed for nausea or vomiting., Disp: 90 tablet, Rfl: 3 .  Pyridoxine HCl (VITAMIN B-6) 250 MG tablet, Take 1 tablet (250 mg total) by mouth daily., Disp: 30 tablet, Rfl: 6 .  albuterol (PROVENTIL HFA;VENTOLIN HFA) 108 (90 Base) MCG/ACT inhaler, Inhale 2 puffs into the lungs every 4 (four) hours as needed for wheezing or shortness of breath. (Patient not taking: Reported on 07/21/2020), Disp: 1 Inhaler, Rfl: 0 .  amLODipine  (NORVASC) 10 MG tablet, Take 1 tablet (10 mg total) by mouth daily. (Patient not taking: Reported on 06/15/2020), Disp: 30 tablet, Rfl: 3 .  metoCLOPramide (REGLAN) 5 MG tablet, Take 1 tablet (5 mg total) by mouth 4 (four) times daily -  before meals and at bedtime., Disp: 45 tablet, Rfl: 3  Allergies:  Allergies  Allergen Reactions  . Iodinated Diagnostic Agents Itching and Rash  . Ivp Dye [Iodinated Diagnostic Agents] Itching and Rash  . Morphine And Related Hives  . Penicillins Rash    Past Medical History, Surgical history, Social history, and Family History were reviewed and updated.  Review of Systems: Review of Systems  Constitutional: Negative.   HENT:  Negative.   Eyes: Negative.   Respiratory: Negative.   Cardiovascular: Negative.   Gastrointestinal: Positive for abdominal pain and nausea.  Endocrine: Negative.   Genitourinary: Negative.    Musculoskeletal: Negative.   Skin: Negative.   Neurological: Negative.   Hematological: Negative.   Psychiatric/Behavioral: Negative.     Physical Exam:  weight is 217 lb 4 oz (98.5 kg). Her oral temperature is 98 F (36.7 C). Her blood pressure is 119/77 and her pulse is 108 (abnormal). Her respiration is 18 and oxygen saturation is 99%.   Wt Readings from Last 3 Encounters:  07/21/20 217 lb 4 oz (98.5 kg)  06/22/20 213 lb (96.6 kg)  06/15/20 212 lb (96.2 kg)   Her vital signs show a temperature of 97.1.  Pulse is 100.  Blood pressure 116/70.  Weight is 230 pounds.  Physical Exam Vitals reviewed.  HENT:     Head: Normocephalic and atraumatic.  Eyes:     Pupils: Pupils are equal, round, and reactive to light.  Cardiovascular:     Rate and Rhythm: Normal rate and regular rhythm.     Heart sounds: Normal heart sounds.  Pulmonary:     Effort: Pulmonary effort is normal.     Breath sounds: Normal breath sounds.  Abdominal:     General: Bowel sounds are normal.     Palpations: Abdomen is soft.  Musculoskeletal:         General: No tenderness or deformity. Normal range of motion.     Cervical back: Normal range of motion.  Lymphadenopathy:     Cervical: No cervical adenopathy.  Skin:    General: Skin is warm and dry.     Findings: No erythema or rash.  Neurological:     Mental Status: She is alert and oriented to person, place, and time.  Psychiatric:  Behavior: Behavior normal.        Thought Content: Thought content normal.        Judgment: Judgment normal.      Lab Results  Component Value Date   WBC 7.2 07/21/2020   HGB 11.0 (L) 07/21/2020   HCT 36.6 07/21/2020   MCV 91.5 07/21/2020   PLT 275 07/21/2020     Chemistry      Component Value Date/Time   NA 136 06/29/2020 0908   NA 136 01/07/2020 1256   K 4.1 06/29/2020 0908   CL 103 06/29/2020 0908   CO2 25 06/29/2020 0908   BUN 12 06/29/2020 0908   BUN 10 01/07/2020 1256   CREATININE 0.65 06/29/2020 0908      Component Value Date/Time   CALCIUM 9.5 06/29/2020 0908   ALKPHOS 36 (L) 06/29/2020 0908   AST 21 06/29/2020 0908   ALT 22 06/29/2020 0908   BILITOT 0.5 06/29/2020 0908       Impression and Plan: Angela Wilson is a very nice 54 year old woman from [place].  She has metastatic pancreatic cancer.  By the tumor markers, she seems to be responding.  CT scan shows that everything is stable which is always a good thing.  Again, we will try to get her over to [place] after this cycle of treatment.  She may go a little bit sooner.  We really just want to have her quality of life is good as possible.  It will be fantastic when she goes over to [place] to see the birth of a grandchild.  Again, they will let us know when she will be coming back from [place].  We will have to get her set up with treatment at that time.  Hopefully, this will be in December or at least early January.       Volanda Napoleon, MD 11/2/20219:40 AM

## 2020-07-22 ENCOUNTER — Telehealth: Payer: Self-pay | Admitting: Hematology & Oncology

## 2020-07-22 ENCOUNTER — Other Ambulatory Visit: Payer: Self-pay | Admitting: Family Medicine

## 2020-07-22 NOTE — Telephone Encounter (Signed)
Requested medication (s) are due for refill today: Yes  Requested medication (s) are on the active medication list: Yes  Last refill:  3-5 months ago  Future visit scheduled: Yes  Notes to clinic: Last refilled by another provider. Patient would like new glucose meter and supplies ordered as well     Requested Prescriptions  Pending Prescriptions Disp Refills   amLODipine (NORVASC) 10 MG tablet 90 tablet 1    Sig: Take 1 tablet (10 mg total) by mouth daily.      Cardiovascular:  Calcium Channel Blockers Passed - 07/22/2020  4:43 PM      Passed - Last BP in normal range    BP Readings from Last 1 Encounters:  07/21/20 119/77          Passed - Valid encounter within last 6 months    Recent Outpatient Visits           3 months ago Type 2 diabetes mellitus with hypoglycemia without coma, without long-term current use of insulin [facility])   [facility] at [facility], Lilia Argue, MD   6 months ago Type 2 diabetes mellitus with hyperglycemia, with long-term current use of insulin [facility])   Primary Care at [facility], Lorelee Market, NP       Future Appointments             In 1 week Just, Laurita Quint, FNP Primary Care at [place], [facility]   In 2 months Just, Laurita Quint, FNP Primary Care at [place], [facility]              lisinopril (ZESTRIL) 10 MG tablet 90 tablet 1    Sig: Take 1 tablet (10 mg total) by mouth daily.      Cardiovascular:  ACE Inhibitors Passed - 07/22/2020  4:43 PM      Passed - Cr in normal range and within 180 days    Creatinine  Date Value Ref Range Status  06/29/2020 0.65 0.44 - 1.00 mg/dL Final   Creatinine, Ser  Date Value Ref Range Status  07/21/2020 0.58 0.44 - 1.00 mg/dL Final          Passed - K in normal range and within 180 days    Potassium  Date Value Ref Range Status  07/21/2020 4.3 3.5 - 5.1 mmol/L Final          Passed - Patient is not pregnant      Passed - Last BP in normal range    BP Readings from Last 1 Encounters:  07/21/20 119/77           Passed - Valid encounter within last 6 months    Recent Outpatient Visits           3 months ago Type 2 diabetes mellitus with hypoglycemia without coma, without long-term current use of insulin ([place])   [facility] at [facility], Lilia Argue, MD   6 months ago Type 2 diabetes mellitus with hyperglycemia, with long-term current use of insulin [facility])   Primary Care at [facility], Lorelee Market, NP       Future Appointments             In 1 week Just, Laurita Quint, FNP Primary Care at [place], [facility]   In 2 months Just, Laurita Quint, FNP Primary Care at [place], [facility]              glipiZIDE (GLUCOTROL) 10 MG tablet      Sig: Take 1 tablet (  10 mg total) by mouth 2 (two) times daily.      Endocrinology:  Diabetes - Sulfonylureas Failed - 07/22/2020  4:43 PM      Failed - HBA1C is between 0 and 7.9 and within 180 days    Hgb A1c MFr Bld  Date Value Ref Range Status  02/16/2020 8.6 (H) 4.8 - 5.6 % Final    Comment:    (NOTE) Pre diabetes:          5.7%-6.4% Diabetes:              >6.4% Glycemic control for   <7.0% adults with diabetes           Passed - Valid encounter within last 6 months    Recent Outpatient Visits           3 months ago Type 2 diabetes mellitus with hypoglycemia without coma, without long-term current use of insulin ([place])   [facility] at [facility], Lilia Argue, MD   6 months ago Type 2 diabetes mellitus with hyperglycemia, with long-term current use of insulin ([place])   Primary Care at [facility], Lorelee Market, NP       Future Appointments             In 1 week Just, Laurita Quint, FNP Primary Care at [place], [facility]   In 2 months Just, Laurita Quint, FNP Primary Care at [place], [facility]              metFORMIN (GLUCOPHAGE XR) 500 MG 24 hr tablet 90 tablet 1    Sig: Take 1 tablet (500 mg total) by mouth daily with breakfast.      Endocrinology:  Diabetes - Biguanides Failed - 07/22/2020  4:43 PM      Failed - HBA1C is between 0 and 7.9 and within 180 days     Hgb A1c MFr Bld  Date Value Ref Range Status  02/16/2020 8.6 (H) 4.8 - 5.6 % Final    Comment:    (NOTE) Pre diabetes:          5.7%-6.4% Diabetes:              >6.4% Glycemic control for   <7.0% adults with diabetes           Passed - Cr in normal range and within 360 days    Creatinine  Date Value Ref Range Status  06/29/2020 0.65 0.44 - 1.00 mg/dL Final   Creatinine, Ser  Date Value Ref Range Status  07/21/2020 0.58 0.44 - 1.00 mg/dL Final          Passed - eGFR in normal range and within 360 days    GFR, Est AFR Am  Date Value Ref Range Status  06/22/2020 >60 >60 mL/min Final   GFR, Estimated  Date Value Ref Range Status  07/21/2020 >60 >60 mL/min Final    Comment:    (NOTE) Calculated using the CKD-EPI Creatinine Equation (2021)   06/29/2020 >60 >60 mL/min Final          Passed - AA eGFR in normal range and within 360 days    GFR, Est AFR Am  Date Value Ref Range Status  06/22/2020 >60 >60 mL/min Final   GFR, Estimated  Date Value Ref Range Status  07/21/2020 >60 >60 mL/min Final    Comment:    (NOTE) Calculated using the CKD-EPI Creatinine Equation (2021)   06/29/2020 >60 >60 mL/min Final  Passed - Valid encounter within last 6 months    Recent Outpatient Visits           3 months ago Type 2 diabetes mellitus with hypoglycemia without coma, without long-term current use of insulin [facility])   Primary Care at [facility], Lilia Argue, MD   6 months ago Type 2 diabetes mellitus with hyperglycemia, with long-term current use of insulin [facility])   Primary Care at [facility], Lorelee Market, NP       Future Appointments             In 1 week Just, Laurita Quint, FNP Primary Care at [place], [facility]   In 2 months Just, Laurita Quint, FNP Primary Care at [place], [facility]              omeprazole (PRILOSEC) 40 MG capsule 90 capsule 2    Sig: Take 1 capsule (40 mg total) by mouth daily.      Gastroenterology: Proton Pump Inhibitors Passed - 07/22/2020  4:43 PM       Passed - Valid encounter within last 12 months    Recent Outpatient Visits           3 months ago Type 2 diabetes mellitus with hypoglycemia without coma, without long-term current use of insulin [facility])   [facility] at [facility], Lilia Argue, MD   6 months ago Type 2 diabetes mellitus with hyperglycemia, with long-term current use of insulin [facility])   Primary Care at [facility], Lorelee Market, NP       Future Appointments             In 1 week Just, Laurita Quint, FNP Primary Care at [place], [facility]   In 2 months Just, Laurita Quint, FNP Primary Care at [place], [facility]

## 2020-07-22 NOTE — Telephone Encounter (Signed)
No los 11/2

## 2020-07-22 NOTE — Telephone Encounter (Signed)
Medication Refill - Medication: Amlodipine, lisinopril, glipizide, metformin, omeprazole, test strips/ machine kit,   Has the patient contacted their pharmacy? Yes.   (Agent: If no, request that the patient contact the pharmacy for the refill.) (Agent: If yes, when and what did the pharmacy advise?)  Preferred Pharmacy (with phone number or street name):  [facility] [place] [place]  [place] [address]  Phone: [phone] Fax: [phone]  Hours: Not open 24 hours     Agent: Please be advised that RX refills may take up to 3 business days. We ask that you follow-up with your pharmacy.

## 2020-07-23 MED ORDER — LISINOPRIL 10 MG PO TABS
10.0000 mg | ORAL_TABLET | Freq: Every day | ORAL | 1 refills | Status: DC
Start: 2020-07-23 — End: 2020-10-07

## 2020-07-23 MED ORDER — GLIPIZIDE 10 MG PO TABS
10.0000 mg | ORAL_TABLET | Freq: Two times a day (BID) | ORAL | 1 refills | Status: DC
Start: 2020-07-23 — End: 2020-10-07

## 2020-07-23 MED ORDER — AMLODIPINE BESYLATE 10 MG PO TABS
10.0000 mg | ORAL_TABLET | Freq: Every day | ORAL | 1 refills | Status: DC
Start: 2020-07-23 — End: 2020-10-07

## 2020-07-23 MED ORDER — METFORMIN HCL ER 500 MG PO TB24
500.0000 mg | ORAL_TABLET | Freq: Every day | ORAL | 1 refills | Status: DC
Start: 2020-07-23 — End: 2020-10-07

## 2020-07-23 MED ORDER — OMEPRAZOLE 40 MG PO CPDR
40.0000 mg | DELAYED_RELEASE_CAPSULE | Freq: Every day | ORAL | 2 refills | Status: DC
Start: 2020-07-23 — End: 2020-10-07

## 2020-07-27 ENCOUNTER — Other Ambulatory Visit: Payer: Self-pay | Admitting: *Deleted

## 2020-07-27 DIAGNOSIS — C251 Malignant neoplasm of body of pancreas: Secondary | ICD-10-CM

## 2020-07-27 DIAGNOSIS — C252 Malignant neoplasm of tail of pancreas: Secondary | ICD-10-CM

## 2020-07-28 ENCOUNTER — Inpatient Hospital Stay: Payer: 59

## 2020-07-28 ENCOUNTER — Encounter: Payer: Self-pay | Admitting: *Deleted

## 2020-07-28 ENCOUNTER — Other Ambulatory Visit: Payer: Self-pay

## 2020-07-28 VITALS — BP 106/58 | HR 109 | Temp 98.2°F | Resp 17

## 2020-07-28 DIAGNOSIS — C252 Malignant neoplasm of tail of pancreas: Secondary | ICD-10-CM

## 2020-07-28 DIAGNOSIS — C251 Malignant neoplasm of body of pancreas: Secondary | ICD-10-CM

## 2020-07-28 DIAGNOSIS — Z5111 Encounter for antineoplastic chemotherapy: Secondary | ICD-10-CM | POA: Diagnosis not present

## 2020-07-28 LAB — CMP (CANCER CENTER ONLY)
ALT: 18 U/L (ref 0–44)
AST: 22 U/L (ref 15–41)
Albumin: 3.6 g/dL (ref 3.5–5.0)
Alkaline Phosphatase: 42 U/L (ref 38–126)
Anion gap: 5 (ref 5–15)
BUN: 12 mg/dL (ref 6–20)
CO2: 28 mmol/L (ref 22–32)
Calcium: 9.8 mg/dL (ref 8.9–10.3)
Chloride: 102 mmol/L (ref 98–111)
Creatinine: 0.57 mg/dL (ref 0.44–1.00)
GFR, Estimated: >60 mL/min (ref >60)
Glucose, Bld: 163 mg/dL — ABNORMAL HIGH (ref 70–99)
Potassium: 4.1 mmol/L (ref 3.5–5.1)
Sodium: 135 mmol/L (ref 135–145)
Total Bilirubin: 0.3 mg/dL (ref 0.3–1.2)
Total Protein: 7 g/dL (ref 6.5–8.1)

## 2020-07-28 LAB — CBC WITH DIFFERENTIAL (CANCER CENTER ONLY)
Abs Immature Granulocytes: 0.07 10*3/uL (ref 0.00–0.07)
Basophils Absolute: 0 10*3/uL (ref 0.0–0.1)
Basophils Relative: 1 %
Eosinophils Absolute: 0.1 10*3/uL (ref 0.0–0.5)
Eosinophils Relative: 1 %
HCT: 33 % — ABNORMAL LOW (ref 36.0–46.0)
Hemoglobin: 10.4 g/dL — ABNORMAL LOW (ref 12.0–15.0)
Immature Granulocytes: 1 %
Lymphocytes Relative: 25 %
Lymphs Abs: 1.5 10*3/uL (ref 0.7–4.0)
MCH: 28.3 pg (ref 26.0–34.0)
MCHC: 31.5 g/dL (ref 30.0–36.0)
MCV: 89.9 fL (ref 80.0–100.0)
Monocytes Absolute: 0.4 10*3/uL (ref 0.1–1.0)
Monocytes Relative: 6 %
Neutro Abs: 3.9 10*3/uL (ref 1.7–7.7)
Neutrophils Relative %: 66 %
Platelet Count: 141 10*3/uL — ABNORMAL LOW (ref 150–400)
RBC: 3.67 MIL/uL — ABNORMAL LOW (ref 3.87–5.11)
RDW: 16.4 % — ABNORMAL HIGH (ref 11.5–15.5)
WBC Count: 5.9 10*3/uL (ref 4.0–10.5)
nRBC: 0 % (ref 0.0–0.2)

## 2020-07-28 MED ORDER — SODIUM CHLORIDE 0.9% FLUSH
10.0000 mL | INTRAVENOUS | Status: DC | PRN
  Administered 2020-07-28: 10 mL
  Filled 2020-07-28: qty 10

## 2020-07-28 MED ORDER — PALONOSETRON HCL INJECTION 0.25 MG/5ML
INTRAVENOUS | Status: AC
  Filled 2020-07-28: qty 5

## 2020-07-28 MED ORDER — PACLITAXEL PROTEIN-BOUND CHEMO INJECTION 100 MG
100.0000 mg/m2 | Freq: Once | INTRAVENOUS | Status: AC
  Administered 2020-07-28: 200 mg via INTRAVENOUS
  Filled 2020-07-28: qty 40

## 2020-07-28 MED ORDER — SODIUM CHLORIDE 0.9 % IV SOLN
10.0000 mg | Freq: Once | INTRAVENOUS | Status: AC
  Administered 2020-07-28: 10 mg via INTRAVENOUS
  Filled 2020-07-28: qty 10

## 2020-07-28 MED ORDER — HEPARIN SOD (PORK) LOCK FLUSH 100 UNIT/ML IV SOLN
500.0000 [IU] | Freq: Once | INTRAVENOUS | Status: AC | PRN
  Administered 2020-07-28: 500 [IU]
  Filled 2020-07-28: qty 5

## 2020-07-28 MED ORDER — SODIUM CHLORIDE 0.9 % IV SOLN
Freq: Once | INTRAVENOUS | Status: AC
  Filled 2020-07-28: qty 250

## 2020-07-28 MED ORDER — SODIUM CHLORIDE 0.9 % IV SOLN
2000.0000 mg | Freq: Once | INTRAVENOUS | Status: AC
  Administered 2020-07-28: 2000 mg via INTRAVENOUS
  Filled 2020-07-28: qty 52.6

## 2020-07-28 MED ORDER — PALONOSETRON HCL INJECTION 0.25 MG/5ML
0.2500 mg | Freq: Once | INTRAVENOUS | Status: AC
  Administered 2020-07-28: 0.25 mg via INTRAVENOUS

## 2020-07-28 NOTE — Progress Notes (Signed)
Recived MyChart message that patient is experiencing mouth/throat discomfort. Spoke to [facility] NP and she will assess patient in infusion.  Daughter, wants to know when to start anticoagulants. Dr Marin Olp would like patient to start the day before travel.   Patient is planning to return home on 08/08/20. She will return at the end of December. We will schedule a follow up appointment the first week of January to see MD and restart her treatment.   Oncology Nurse Navigator Documentation  Oncology Nurse Navigator Flowsheets 07/28/2020  Confirmed Diagnosis Date -  Diagnosis Status -  Planned Course of Treatment -  Phase of Treatment -  Chemotherapy Pending- Reason: -  Chemotherapy Actual Start Date: -  Navigator Follow Up Date: 09/22/2020  Navigator Follow Up Reason: Follow-up Appointment;Chemotherapy  Navigator Location [facility]  Referral Date to RadOnc/MedOnc -  Navigator Encounter Type MyChart;Treatment  Telephone -  Treatment Initiated Date -  Patient Visit Type MedOnc  Treatment Phase Active Tx  Barriers/Navigation Needs Coordination of Care;Cultural Needs;Family Concerns;Language/Communication  Education Other  Interventions Coordination of Care;Education;Medication Assistance;Psycho-Social Support  Acuity Level 2-Minimal Needs (1-2 Barriers Identified)  Referrals -  Coordination of Care Appts;Other  Education Method Verbal  Support Groups/Services Friends and Family  Time Spent with Patient 59

## 2020-07-28 NOTE — Progress Notes (Signed)
Pt. stable and asymptomatic at discharge.

## 2020-07-28 NOTE — Patient Instructions (Signed)
[facility] Discharge Instructions for Patients Receiving Chemotherapy  Today you received the following chemotherapy agents Gemzar, Abraxane  To help prevent nausea and vomiting after your treatment, we encourage you to take your nausea medication as indicated by your MD.   If you develop nausea and vomiting that is not controlled by your nausea medication, call the clinic.   BELOW ARE SYMPTOMS THAT SHOULD BE REPORTED IMMEDIATELY:  *FEVER GREATER THAN 100.5 F  *CHILLS WITH OR WITHOUT FEVER  NAUSEA AND VOMITING THAT IS NOT CONTROLLED WITH YOUR NAUSEA MEDICATION  *UNUSUAL SHORTNESS OF BREATH  *UNUSUAL BRUISING OR BLEEDING  TENDERNESS IN MOUTH AND THROAT WITH OR WITHOUT PRESENCE OF ULCERS  *URINARY PROBLEMS  *BOWEL PROBLEMS  UNUSUAL RASH Items with * indicate a potential emergency and should be followed up as soon as possible.  Feel free to call the clinic should you have any questions or concerns. The clinic phone number is [phone].  Please show the [place] at check-in to the Emergency Department and triage nurse.

## 2020-07-28 NOTE — Progress Notes (Signed)
Okay to treat with HR 109 per Dr. Marin Olp.

## 2020-07-29 ENCOUNTER — Telehealth: Payer: Self-pay | Admitting: Hematology & Oncology

## 2020-07-29 ENCOUNTER — Other Ambulatory Visit: Payer: Self-pay | Admitting: Family

## 2020-07-29 DIAGNOSIS — C252 Malignant neoplasm of tail of pancreas: Secondary | ICD-10-CM

## 2020-07-29 DIAGNOSIS — B370 Candidal stomatitis: Secondary | ICD-10-CM

## 2020-07-29 MED ORDER — MAGIC MOUTHWASH W/LIDOCAINE: 5.0000 mL | Freq: Three times a day (TID) | ORAL | 1 refills | Status: DC | PRN

## 2020-07-29 NOTE — Telephone Encounter (Signed)
I called and LMVM for daughter with updated appointments per 11/09 los

## 2020-07-29 NOTE — Progress Notes (Signed)
Patient has viable oral thrush on back of tongue and throat. Magic mouth wash prescription sent to [facility] per their request. No other questions or concerns at this time.

## 2020-07-30 ENCOUNTER — Ambulatory Visit: Payer: 59 | Admitting: Family Medicine

## 2020-08-03 ENCOUNTER — Other Ambulatory Visit: Payer: Self-pay | Admitting: *Deleted

## 2020-08-03 DIAGNOSIS — C251 Malignant neoplasm of body of pancreas: Secondary | ICD-10-CM

## 2020-08-03 DIAGNOSIS — C252 Malignant neoplasm of tail of pancreas: Secondary | ICD-10-CM

## 2020-08-03 DIAGNOSIS — Z8041 Family history of malignant neoplasm of ovary: Secondary | ICD-10-CM

## 2020-08-04 ENCOUNTER — Inpatient Hospital Stay: Payer: 59

## 2020-08-04 ENCOUNTER — Other Ambulatory Visit: Payer: Self-pay

## 2020-08-04 ENCOUNTER — Encounter: Payer: Self-pay | Admitting: *Deleted

## 2020-08-04 VITALS — HR 98

## 2020-08-04 DIAGNOSIS — Z8041 Family history of malignant neoplasm of ovary: Secondary | ICD-10-CM

## 2020-08-04 DIAGNOSIS — C252 Malignant neoplasm of tail of pancreas: Secondary | ICD-10-CM

## 2020-08-04 DIAGNOSIS — Z5111 Encounter for antineoplastic chemotherapy: Secondary | ICD-10-CM | POA: Diagnosis not present

## 2020-08-04 DIAGNOSIS — C251 Malignant neoplasm of body of pancreas: Secondary | ICD-10-CM

## 2020-08-04 LAB — CBC WITH DIFFERENTIAL (CANCER CENTER ONLY)
Abs Immature Granulocytes: 0.05 10*3/uL (ref 0.00–0.07)
Basophils Absolute: 0 10*3/uL (ref 0.0–0.1)
Basophils Relative: 0 %
Eosinophils Absolute: 0 10*3/uL (ref 0.0–0.5)
Eosinophils Relative: 1 %
HCT: 31.2 % — ABNORMAL LOW (ref 36.0–46.0)
Hemoglobin: 9.6 g/dL — ABNORMAL LOW (ref 12.0–15.0)
Immature Granulocytes: 1 %
Lymphocytes Relative: 35 %
Lymphs Abs: 1.3 10*3/uL (ref 0.7–4.0)
MCH: 27.5 pg (ref 26.0–34.0)
MCHC: 30.8 g/dL (ref 30.0–36.0)
MCV: 89.4 fL (ref 80.0–100.0)
Monocytes Absolute: 0.3 10*3/uL (ref 0.1–1.0)
Monocytes Relative: 7 %
Neutro Abs: 2.1 10*3/uL (ref 1.7–7.7)
Neutrophils Relative %: 56 %
Platelet Count: 119 10*3/uL — ABNORMAL LOW (ref 150–400)
RBC: 3.49 MIL/uL — ABNORMAL LOW (ref 3.87–5.11)
RDW: 16 % — ABNORMAL HIGH (ref 11.5–15.5)
WBC Count: 3.8 10*3/uL — ABNORMAL LOW (ref 4.0–10.5)
nRBC: 0 % (ref 0.0–0.2)

## 2020-08-04 LAB — CMP (CANCER CENTER ONLY)
ALT: 21 U/L (ref 0–44)
AST: 21 U/L (ref 15–41)
Albumin: 3.6 g/dL (ref 3.5–5.0)
Alkaline Phosphatase: 44 U/L (ref 38–126)
Anion gap: 7 (ref 5–15)
BUN: 10 mg/dL (ref 6–20)
CO2: 27 mmol/L (ref 22–32)
Calcium: 9.4 mg/dL (ref 8.9–10.3)
Chloride: 102 mmol/L (ref 98–111)
Creatinine: 0.61 mg/dL (ref 0.44–1.00)
GFR, Estimated: >60 mL/min (ref >60)
Glucose, Bld: 141 mg/dL — ABNORMAL HIGH (ref 70–99)
Potassium: 4 mmol/L (ref 3.5–5.1)
Sodium: 136 mmol/L (ref 135–145)
Total Bilirubin: 0.3 mg/dL (ref 0.3–1.2)
Total Protein: 6.7 g/dL (ref 6.5–8.1)

## 2020-08-04 MED ORDER — SODIUM CHLORIDE 0.9 % IV SOLN
Freq: Once | INTRAVENOUS | Status: AC
  Filled 2020-08-04: qty 250

## 2020-08-04 MED ORDER — SODIUM CHLORIDE 0.9 % IV SOLN
Freq: Once | INTRAVENOUS | Status: DC
  Filled 2020-08-04: qty 250

## 2020-08-04 MED ORDER — PACLITAXEL PROTEIN-BOUND CHEMO INJECTION 100 MG
100.0000 mg/m2 | Freq: Once | INTRAVENOUS | Status: AC
  Administered 2020-08-04: 200 mg via INTRAVENOUS
  Filled 2020-08-04: qty 40

## 2020-08-04 MED ORDER — SODIUM CHLORIDE 0.9 % IV SOLN
10.0000 mg | Freq: Once | INTRAVENOUS | Status: AC
  Administered 2020-08-04: 10 mg via INTRAVENOUS
  Filled 2020-08-04: qty 10

## 2020-08-04 MED ORDER — PALONOSETRON HCL INJECTION 0.25 MG/5ML
0.2500 mg | Freq: Once | INTRAVENOUS | Status: AC
  Administered 2020-08-04: 0.25 mg via INTRAVENOUS

## 2020-08-04 MED ORDER — PALONOSETRON HCL INJECTION 0.25 MG/5ML
INTRAVENOUS | Status: AC
  Filled 2020-08-04: qty 5

## 2020-08-04 MED ORDER — SODIUM CHLORIDE 0.9 % IV SOLN
2000.0000 mg | Freq: Once | INTRAVENOUS | Status: AC
  Administered 2020-08-04: 2000 mg via INTRAVENOUS
  Filled 2020-08-04: qty 52.6

## 2020-08-04 MED ORDER — SODIUM CHLORIDE 0.9% FLUSH
10.0000 mL | INTRAVENOUS | Status: DC | PRN
  Administered 2020-08-04: 10 mL
  Filled 2020-08-04: qty 10

## 2020-08-04 MED ORDER — HEPARIN SOD (PORK) LOCK FLUSH 100 UNIT/ML IV SOLN
500.0000 [IU] | Freq: Once | INTRAVENOUS | Status: AC | PRN
  Administered 2020-08-04: 500 [IU]
  Filled 2020-08-04: qty 5

## 2020-08-04 NOTE — Patient Instructions (Signed)
[facility] Discharge Instructions for Patients Receiving Chemotherapy  Today you received the following chemotherapy agents Gemzar, Abraxane.  To help prevent nausea and vomiting after your treatment, we encourage you to take your nausea medicationas indicated by your MD.   If you develop nausea and vomiting that is not controlled by your nausea medication, call the clinic.   BELOW ARE SYMPTOMS THAT SHOULD BE REPORTED IMMEDIATELY:  *FEVER GREATER THAN 100.5 F  *CHILLS WITH OR WITHOUT FEVER  NAUSEA AND VOMITING THAT IS NOT CONTROLLED WITH YOUR NAUSEA MEDICATION  *UNUSUAL SHORTNESS OF BREATH  *UNUSUAL BRUISING OR BLEEDING  TENDERNESS IN MOUTH AND THROAT WITH OR WITHOUT PRESENCE OF ULCERS  *URINARY PROBLEMS  *BOWEL PROBLEMS  UNUSUAL RASH Items with * indicate a potential emergency and should be followed up as soon as possible.  Feel free to call the clinic should you have any questions or concerns. The clinic phone number is [phone].  Please show the [place] at check-in to the Emergency Department and triage nurse.

## 2020-08-04 NOTE — Progress Notes (Signed)
Pt. is accompanied by son-in-law who interprets for her.  Pt. stable and asymptomatic at discharge.

## 2020-08-05 ENCOUNTER — Other Ambulatory Visit: Payer: Self-pay | Admitting: *Deleted

## 2020-08-05 DIAGNOSIS — C252 Malignant neoplasm of tail of pancreas: Secondary | ICD-10-CM

## 2020-08-05 MED ORDER — HYDROCODONE-HOMATROPINE 5-1.5 MG/5ML PO SYRP: 5.0000 mL | ORAL_SOLUTION | Freq: Four times a day (QID) | ORAL | 0 refills | Status: DC | PRN

## 2020-09-10 ENCOUNTER — Encounter: Payer: Self-pay | Admitting: *Deleted

## 2020-09-12 ENCOUNTER — Encounter: Payer: Self-pay | Admitting: Hematology & Oncology

## 2020-09-12 ENCOUNTER — Encounter: Payer: Self-pay | Admitting: Family

## 2020-09-12 ENCOUNTER — Encounter: Payer: Self-pay | Admitting: *Deleted

## 2020-09-22 ENCOUNTER — Inpatient Hospital Stay (HOSPITAL_BASED_OUTPATIENT_CLINIC_OR_DEPARTMENT_OTHER): Payer: 59 | Admitting: Hematology & Oncology

## 2020-09-22 ENCOUNTER — Inpatient Hospital Stay: Payer: 59

## 2020-09-22 ENCOUNTER — Encounter: Payer: Self-pay | Admitting: Hematology & Oncology

## 2020-09-22 ENCOUNTER — Encounter: Payer: Self-pay | Admitting: *Deleted

## 2020-09-22 ENCOUNTER — Other Ambulatory Visit: Payer: Self-pay

## 2020-09-22 ENCOUNTER — Inpatient Hospital Stay: Payer: 59 | Attending: Hematology & Oncology

## 2020-09-22 VITALS — BP 119/62 | HR 98 | Temp 98.2°F | Resp 18 | Wt 208.2 lb

## 2020-09-22 DIAGNOSIS — Z5111 Encounter for antineoplastic chemotherapy: Secondary | ICD-10-CM | POA: Insufficient documentation

## 2020-09-22 DIAGNOSIS — C252 Malignant neoplasm of tail of pancreas: Secondary | ICD-10-CM

## 2020-09-22 DIAGNOSIS — G893 Neoplasm related pain (acute) (chronic): Secondary | ICD-10-CM | POA: Insufficient documentation

## 2020-09-22 DIAGNOSIS — C253 Malignant neoplasm of pancreatic duct: Secondary | ICD-10-CM

## 2020-09-22 DIAGNOSIS — Z7984 Long term (current) use of oral hypoglycemic drugs: Secondary | ICD-10-CM | POA: Diagnosis not present

## 2020-09-22 DIAGNOSIS — R970 Elevated carcinoembryonic antigen [CEA]: Secondary | ICD-10-CM | POA: Diagnosis not present

## 2020-09-22 DIAGNOSIS — N200 Calculus of kidney: Secondary | ICD-10-CM

## 2020-09-22 DIAGNOSIS — R971 Elevated cancer antigen 125 [CA 125]: Secondary | ICD-10-CM | POA: Diagnosis not present

## 2020-09-22 DIAGNOSIS — Z79899 Other long term (current) drug therapy: Secondary | ICD-10-CM | POA: Insufficient documentation

## 2020-09-22 DIAGNOSIS — Z7982 Long term (current) use of aspirin: Secondary | ICD-10-CM | POA: Diagnosis not present

## 2020-09-22 DIAGNOSIS — J910 Malignant pleural effusion: Secondary | ICD-10-CM | POA: Insufficient documentation

## 2020-09-22 DIAGNOSIS — Z79891 Long term (current) use of opiate analgesic: Secondary | ICD-10-CM | POA: Diagnosis not present

## 2020-09-22 DIAGNOSIS — R110 Nausea: Secondary | ICD-10-CM | POA: Insufficient documentation

## 2020-09-22 DIAGNOSIS — C251 Malignant neoplasm of body of pancreas: Secondary | ICD-10-CM

## 2020-09-22 LAB — CBC WITH DIFFERENTIAL (CANCER CENTER ONLY)
Abs Immature Granulocytes: 0.01 10*3/uL (ref 0.00–0.07)
Basophils Absolute: 0 10*3/uL (ref 0.0–0.1)
Basophils Relative: 0 %
Eosinophils Absolute: 0.1 10*3/uL (ref 0.0–0.5)
Eosinophils Relative: 3 %
HCT: 39.1 % (ref 36.0–46.0)
Hemoglobin: 12.4 g/dL (ref 12.0–15.0)
Immature Granulocytes: 0 %
Lymphocytes Relative: 22 %
Lymphs Abs: 1 10*3/uL (ref 0.7–4.0)
MCH: 27.1 pg (ref 26.0–34.0)
MCHC: 31.7 g/dL (ref 30.0–36.0)
MCV: 85.6 fL (ref 80.0–100.0)
Monocytes Absolute: 0.3 10*3/uL (ref 0.1–1.0)
Monocytes Relative: 7 %
Neutro Abs: 3 10*3/uL (ref 1.7–7.7)
Neutrophils Relative %: 68 %
Platelet Count: 225 10*3/uL (ref 150–400)
RBC: 4.57 MIL/uL (ref 3.87–5.11)
RDW: 15 % (ref 11.5–15.5)
WBC Count: 4.5 10*3/uL (ref 4.0–10.5)
nRBC: 0 % (ref 0.0–0.2)

## 2020-09-22 LAB — CMP (CANCER CENTER ONLY)
ALT: 11 U/L (ref 0–44)
AST: 17 U/L (ref 15–41)
Albumin: 3.8 g/dL (ref 3.5–5.0)
Alkaline Phosphatase: 43 U/L (ref 38–126)
Anion gap: 6 (ref 5–15)
BUN: 14 mg/dL (ref 6–20)
CO2: 28 mmol/L (ref 22–32)
Calcium: 9.8 mg/dL (ref 8.9–10.3)
Chloride: 100 mmol/L (ref 98–111)
Creatinine: 0.64 mg/dL (ref 0.44–1.00)
GFR, Estimated: >60 mL/min (ref >60)
Glucose, Bld: 123 mg/dL — ABNORMAL HIGH (ref 70–99)
Potassium: 4.3 mmol/L (ref 3.5–5.1)
Sodium: 134 mmol/L — ABNORMAL LOW (ref 135–145)
Total Bilirubin: 0.4 mg/dL (ref 0.3–1.2)
Total Protein: 7.4 g/dL (ref 6.5–8.1)

## 2020-09-22 MED ORDER — PACLITAXEL PROTEIN-BOUND CHEMO INJECTION 100 MG
100.0000 mg/m2 | Freq: Once | INTRAVENOUS | Status: AC
  Administered 2020-09-22: 200 mg via INTRAVENOUS
  Filled 2020-09-22: qty 40

## 2020-09-22 MED ORDER — HYDROMORPHONE HCL 2 MG PO TABS: 1.0000 mg | ORAL_TABLET | Freq: Four times a day (QID) | ORAL | 0 refills | Status: DC | PRN

## 2020-09-22 MED ORDER — SODIUM CHLORIDE 0.9% FLUSH
10.0000 mL | INTRAVENOUS | Status: DC | PRN
  Administered 2020-09-22: 10 mL
  Filled 2020-09-22: qty 10

## 2020-09-22 MED ORDER — HEPARIN SOD (PORK) LOCK FLUSH 100 UNIT/ML IV SOLN
500.0000 [IU] | Freq: Once | INTRAVENOUS | Status: AC | PRN
  Administered 2020-09-22: 500 [IU]
  Filled 2020-09-22: qty 5

## 2020-09-22 MED ORDER — SODIUM CHLORIDE 0.9 % IV SOLN
Freq: Once | INTRAVENOUS | Status: AC
  Filled 2020-09-22: qty 250

## 2020-09-22 MED ORDER — PALONOSETRON HCL INJECTION 0.25 MG/5ML
INTRAVENOUS | Status: AC
  Filled 2020-09-22: qty 5

## 2020-09-22 MED ORDER — SODIUM CHLORIDE 0.9 % IV SOLN
2000.0000 mg | Freq: Once | INTRAVENOUS | Status: AC
Start: 2020-09-22 — End: 2020-09-22
  Administered 2020-09-22: 2000 mg via INTRAVENOUS
  Filled 2020-09-22: qty 52.6

## 2020-09-22 MED ORDER — SODIUM CHLORIDE 0.9% FLUSH
10.0000 mL | Freq: Once | INTRAVENOUS | Status: AC
  Administered 2020-09-22: 10 mL
  Filled 2020-09-22: qty 10

## 2020-09-22 MED ORDER — SODIUM CHLORIDE 0.9 % IV SOLN
10.0000 mg | Freq: Once | INTRAVENOUS | Status: AC
  Administered 2020-09-22: 10 mg via INTRAVENOUS
  Filled 2020-09-22: qty 10

## 2020-09-22 MED ORDER — PALONOSETRON HCL INJECTION 0.25 MG/5ML
0.2500 mg | Freq: Once | INTRAVENOUS | Status: AC
  Administered 2020-09-22: 0.25 mg via INTRAVENOUS

## 2020-09-22 NOTE — Addendum Note (Signed)
Addended by: Arlan Organ R on: 09/22/2020 04:39 PM   Modules accepted: Orders

## 2020-09-22 NOTE — Progress Notes (Signed)
Insurance PA okay for treatment today per Brandt Loosen.

## 2020-09-22 NOTE — Progress Notes (Signed)
Visited with patient, and her son, after her MD visit. She is back from her trip. She said she had a great time. A granddaughter was born while she was there. She is ready to get back to treatment as she has had increased abdominal pain.   Oncology Nurse Navigator Documentation  Oncology Nurse Navigator Flowsheets 09/22/2020  Confirmed Diagnosis Date -  Diagnosis Status -  Planned Course of Treatment -  Phase of Treatment -  Chemotherapy Pending- Reason: -  Chemotherapy Actual Start Date: -  Navigator Follow Up Date: 10/20/2020  Navigator Follow Up Reason: Follow-up Appointment;Chemotherapy  Navigator Location [facility]  Referral Date to RadOnc/MedOnc -  Navigator Encounter Type Treatment;Appt/Treatment Plan Review  Telephone -  Treatment Initiated Date -  Patient Visit Type MedOnc  Treatment Phase Active Tx  Barriers/Navigation Needs Coordination of Care;Cultural Needs;Family Concerns;Language/Communication  Education -  Interventions Psycho-Social Support  Acuity Level 2-Minimal Needs (1-2 Barriers Identified)  Referrals -  Coordination of Care -  Education Method -  Support Groups/Services Friends and Family  Time Spent with Patient 30

## 2020-09-22 NOTE — Patient Instructions (Signed)

## 2020-09-22 NOTE — Patient Instructions (Signed)
[facility] Discharge Instructions for Patients Receiving Chemotherapy  Today you received the following chemotherapy agents Gemzar, Abraxane  To help prevent nausea and vomiting after your treatment, we encourage you to take your nausea medication    If you develop nausea and vomiting that is not controlled by your nausea medication, call the clinic.   BELOW ARE SYMPTOMS THAT SHOULD BE REPORTED IMMEDIATELY:  *FEVER GREATER THAN 100.5 F  *CHILLS WITH OR WITHOUT FEVER  NAUSEA AND VOMITING THAT IS NOT CONTROLLED WITH YOUR NAUSEA MEDICATION  *UNUSUAL SHORTNESS OF BREATH  *UNUSUAL BRUISING OR BLEEDING  TENDERNESS IN MOUTH AND THROAT WITH OR WITHOUT PRESENCE OF ULCERS  *URINARY PROBLEMS  *BOWEL PROBLEMS  UNUSUAL RASH Items with * indicate a potential emergency and should be followed up as soon as possible.  Feel free to call the clinic should you have any questions or concerns. The clinic phone number is [phone].  Please show the CHEMO ALERT CARD at check-in to the Emergency Department and triage nurse.   

## 2020-09-22 NOTE — Progress Notes (Signed)
Hematology and Oncology Follow Up Visit  Angela Wilson [id number] 12/08/1965 55 y.o. 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 is back from [place].  She went over there in November to see a granddaughter be warm.  She had a good time over there.  She actually brought back a little gift for me.  I am very humble by this.  She also took pictures of camel's for me.  That she actually had a couple pictures of an albino camel that I was very impressed with.  She is having a bit more in the way of abdominal pain.  I am sure that we are probably seeing some cancer activity because of her lack of therapy for couple months.  However, I really felt that it was very important for her to get over to [place] to see her granddaughter be born.  She is on a Duragesic patch.  She is on oxycodone.  We may have to change the oxycodone to Dilaudid and see if that can help with this of abdominal discomfort.  There has not been any issues with fever.  She has had no problems with bowels or bladder.  She has had no diarrhea.  She has had no constipation.  There is been no dysuria.  She has had no bleeding.  Her appetite is doing quite well.  When we last saw her, her CEA was 2.4.  Currently, her performance status is ECOG 1.     Medications:  Current Outpatient Medications:  .  amLODipine (NORVASC) 10 MG tablet, Take 1 tablet (10 mg total) by mouth daily., Disp: 90 tablet, Rfl: 1 .  aspirin 81 MG chewable tablet, Chew 81 mg by mouth daily. , Disp: , Rfl:  .  cetirizine (ZYRTEC) 10 MG tablet, Take 1 tablet (10 mg total) by mouth daily., Disp: 30 tablet, Rfl: 3 .  CREON 36000-114000 units CPEP capsule, , Disp: , Rfl:  .  dronabinol (MARINOL) 2.5 MG capsule, Take 1 capsule (2.5 mg total) by  mouth 2 (two) times daily before lunch and supper., Disp: 60 capsule, Rfl: 0 .  ergocalciferol (VITAMIN D2) 1.25 MG (50000 UT) capsule, Take 50,000 Units by mouth once a week. , Disp: , Rfl:  .  fentaNYL (DURAGESIC) 50 MCG/HR, Place 1 patch onto the skin every 3 (three) days., Disp: 10 patch, Rfl: 0 .  glipiZIDE (GLUCOTROL) 10 MG tablet, Take 1 tablet (10 mg total) by mouth 2 (two) times daily., Disp: 60 tablet, Rfl: 1 .  HYDROcodone-homatropine (HYCODAN) 5-1.5 MG/5ML syrup, Take 5 mLs by mouth every 6 (six) hours as needed for cough., Disp: 120 mL, Rfl: 0 .  lidocaine-prilocaine (EMLA) cream, Apply to port  1 hour before access., Disp: 30 g, Rfl: 3 .  lisinopril (ZESTRIL) 10 MG tablet, Take 1 tablet (10 mg total) by mouth daily., Disp: 90 tablet, Rfl: 1 .  magic mouthwash w/lidocaine SOLN, Take 5 mLs by mouth 3 (three) times daily as needed for mouth pain., Disp: 450 mL, Rfl: 1 .  metFORMIN (GLUCOPHAGE XR) 500 MG 24 hr tablet, Take 1 tablet (500 mg total) by mouth daily with breakfast., Disp: 90 tablet, Rfl: 1 .  omeprazole (PRILOSEC) 40 MG capsule, Take 1 capsule (40 mg total) by mouth daily., Disp: 90 capsule, Rfl: 2 .  ondansetron (ZOFRAN) 4 MG tablet, Take 1 tablet (4 mg total) by mouth every 6 (six) hours as needed for nausea., Disp: 20 tablet, Rfl: 0 .  Oxycodone HCl 10 MG TABS, Take 1 tablet (10 mg total) by mouth every 6 (six) hours as needed., Disp: 90 tablet, Rfl: 0 .  polyethylene glycol (MIRALAX / GLYCOLAX) 17 g packet, Take 17 g by mouth daily as needed., Disp: 14 each, Rfl: 0 .  pravastatin (PRAVACHOL) 40 MG tablet, Take 40 mg by mouth daily., Disp: , Rfl:  .  prochlorperazine (COMPAZINE) 10 MG tablet, Take 1 tablet (10 mg total) by mouth every 6 (six) hours as needed for nausea or vomiting., Disp: 90 tablet, Rfl: 3 .  Pyridoxine HCl (VITAMIN B-6) 250 MG tablet, Take 1 tablet (250 mg total) by mouth daily., Disp: 30 tablet, Rfl: 6 .  rivaroxaban (XARELTO) 10 MG TABS tablet, Take 1  tablet (10 mg total) by mouth daily., Disp: 60 tablet, Rfl: 0 .  albuterol (PROVENTIL HFA;VENTOLIN HFA) 108 (90 Base) MCG/ACT inhaler, Inhale 2 puffs into the lungs every 4 (four) hours as needed for wheezing or shortness of breath. (Patient not taking: No sig reported), Disp: 1 Inhaler, Rfl: 0 .  metoCLOPramide (REGLAN) 5 MG tablet, Take 1 tablet (5 mg total) by mouth 4 (four) times daily -  before meals and at bedtime., Disp: 45 tablet, Rfl: 3  Allergies:  Allergies  Allergen Reactions  . Iodinated Diagnostic Agents Itching and Rash  . Ivp Dye [Iodinated Diagnostic Agents] Itching and Rash  . Morphine And Related Hives  . Penicillins Rash    Past Medical History, Surgical history, Social history, and Family History were reviewed and updated.  Review of Systems: Review of Systems  Constitutional: Negative.   HENT:  Negative.   Eyes: Negative.   Respiratory: Negative.   Cardiovascular: Negative.   Gastrointestinal: Positive for abdominal pain and nausea.  Endocrine: Negative.   Genitourinary: Negative.    Musculoskeletal: Negative.   Skin: Negative.   Neurological: Negative.   Hematological: Negative.   Psychiatric/Behavioral: Negative.     Physical Exam:  weight is 208 lb 4 oz (94.5 kg). Her oral temperature is 98.2 F (36.8 C). Her blood pressure is 119/62 and her pulse is 98. Her respiration is 18 and oxygen saturation is 98%.   Wt Readings from Last 3 Encounters:  09/22/20 208 lb 4 oz (94.5 kg)  07/21/20 217 lb 4 oz (98.5 kg)  06/22/20 213 lb (96.6 kg)   Her vital signs show a temperature of 97.1.  Pulse is 100.  Blood pressure 116/70.  Weight is 230 pounds.  Physical Exam Vitals reviewed.  HENT:     Head: Normocephalic and atraumatic.  Eyes:     Pupils: Pupils are equal, round, and reactive to light.  Cardiovascular:     Rate and Rhythm: Normal rate and regular rhythm.     Heart sounds: Normal heart sounds.  Pulmonary:     Effort: Pulmonary effort is normal.      Breath sounds: Normal breath sounds.  Abdominal:     General: Bowel sounds are normal.     Palpations: Abdomen is soft.  Musculoskeletal:        General: No tenderness or deformity. Normal range of motion.     Cervical back: Normal range of motion.  Lymphadenopathy:     Cervical: No cervical adenopathy.  Skin:    General: Skin is warm and dry.     Findings: No erythema or  rash.  Neurological:     Mental Status: She is alert and oriented to person, place, and time.  Psychiatric:        Behavior: Behavior normal.        Thought Content: Thought content normal.        Judgment: Judgment normal.      Lab Results  Component Value Date   WBC 4.5 09/22/2020   HGB 12.4 09/22/2020   HCT 39.1 09/22/2020   MCV 85.6 09/22/2020   PLT 225 09/22/2020     Chemistry      Component Value Date/Time   NA 136 08/04/2020 1145   NA 136 01/07/2020 1256   K 4.0 08/04/2020 1145   CL 102 08/04/2020 1145   CO2 27 08/04/2020 1145   BUN 10 08/04/2020 1145   BUN 10 01/07/2020 1256   CREATININE 0.61 08/04/2020 1145      Component Value Date/Time   CALCIUM 9.4 08/04/2020 1145   ALKPHOS 44 08/04/2020 1145   AST 21 08/04/2020 1145   ALT 21 08/04/2020 1145   BILITOT 0.3 08/04/2020 1145       Impression and Plan: [name] is a very nice 55 year old woman from [place].  She has metastatic pancreatic cancer.  By the tumor markers, she seems to be responding.  CT scan shows that everything is stable which is always a good thing.  Again, I am very happy that she made it over to [place].  This really has helped her.  We will go ahead and get her back on treatment.  She really tolerated treatment nicely.  It will be interesting to see what her tumor markers are.  I probably would do 2 cycles of treatment and then repeat her scans.  Again, the abdominal pain can sometimes be hard to assess.  By exam I do not feel any fluid.  I cannot feel any peritoneal nodularity that would suggest  malignancy that is progressive.  We will plan to get her back to see [place] in another month.   Josph Macho, MD 1/4/202211:06 AM

## 2020-09-23 ENCOUNTER — Encounter: Payer: Self-pay | Admitting: *Deleted

## 2020-09-23 LAB — CA 125: Cancer Antigen (CA) 125: 69.1 U/mL — ABNORMAL HIGH (ref 0.0–38.1)

## 2020-09-23 LAB — CEA (IN HOUSE-CHCC): CEA (CHCC-In House): 9.6 ng/mL — ABNORMAL HIGH (ref 0.00–5.00)

## 2020-09-24 ENCOUNTER — Telehealth: Payer: Self-pay | Admitting: *Deleted

## 2020-09-24 NOTE — Telephone Encounter (Signed)
-----   Message from Josph Macho, MD sent at 09/23/2020  4:54 PM EST ----- Call her daughter- the CEA and the CA 125 are both up as expected since no therapy has been given for 2 months.  Cindee Lame

## 2020-09-24 NOTE — Telephone Encounter (Signed)
Notified pt's daughter of tumor marker results, verified pt did pick up rx for Dilaudid. No further concerns at this time.

## 2020-09-28 ENCOUNTER — Encounter: Payer: Self-pay | Admitting: *Deleted

## 2020-09-29 ENCOUNTER — Inpatient Hospital Stay: Payer: 59

## 2020-09-30 NOTE — Progress Notes (Signed)
Patient sick this week, day 8 of treatment will be delayed until next week. Dates changed per Dr. Antonieta Pert instructions.

## 2020-10-05 ENCOUNTER — Other Ambulatory Visit: Payer: Self-pay | Admitting: *Deleted

## 2020-10-05 ENCOUNTER — Encounter: Payer: Self-pay | Admitting: *Deleted

## 2020-10-05 DIAGNOSIS — C252 Malignant neoplasm of tail of pancreas: Secondary | ICD-10-CM

## 2020-10-06 ENCOUNTER — Inpatient Hospital Stay: Payer: 59

## 2020-10-06 ENCOUNTER — Telehealth: Payer: Self-pay

## 2020-10-06 ENCOUNTER — Other Ambulatory Visit: Payer: Self-pay | Admitting: Hematology & Oncology

## 2020-10-06 ENCOUNTER — Inpatient Hospital Stay (HOSPITAL_BASED_OUTPATIENT_CLINIC_OR_DEPARTMENT_OTHER): Payer: 59 | Admitting: Hematology & Oncology

## 2020-10-06 ENCOUNTER — Other Ambulatory Visit: Payer: Self-pay

## 2020-10-06 VITALS — HR 88

## 2020-10-06 DIAGNOSIS — C253 Malignant neoplasm of pancreatic duct: Secondary | ICD-10-CM | POA: Diagnosis not present

## 2020-10-06 DIAGNOSIS — C252 Malignant neoplasm of tail of pancreas: Secondary | ICD-10-CM

## 2020-10-06 DIAGNOSIS — Z5111 Encounter for antineoplastic chemotherapy: Secondary | ICD-10-CM | POA: Diagnosis not present

## 2020-10-06 LAB — CMP (CANCER CENTER ONLY)
ALT: 11 U/L (ref 0–44)
AST: 14 U/L — ABNORMAL LOW (ref 15–41)
Albumin: 3.7 g/dL (ref 3.5–5.0)
Alkaline Phosphatase: 45 U/L (ref 38–126)
Anion gap: 7 (ref 5–15)
BUN: 12 mg/dL (ref 6–20)
CO2: 27 mmol/L (ref 22–32)
Calcium: 9.7 mg/dL (ref 8.9–10.3)
Chloride: 101 mmol/L (ref 98–111)
Creatinine: 0.58 mg/dL (ref 0.44–1.00)
GFR, Estimated: >60 mL/min (ref >60)
Glucose, Bld: 137 mg/dL — ABNORMAL HIGH (ref 70–99)
Potassium: 4.1 mmol/L (ref 3.5–5.1)
Sodium: 135 mmol/L (ref 135–145)
Total Bilirubin: 0.5 mg/dL (ref 0.3–1.2)
Total Protein: 7.3 g/dL (ref 6.5–8.1)

## 2020-10-06 LAB — CBC WITH DIFFERENTIAL (CANCER CENTER ONLY)
Abs Immature Granulocytes: 0.01 10*3/uL (ref 0.00–0.07)
Basophils Absolute: 0 10*3/uL (ref 0.0–0.1)
Basophils Relative: 1 %
Eosinophils Absolute: 0.1 10*3/uL (ref 0.0–0.5)
Eosinophils Relative: 3 %
HCT: 37 % (ref 36.0–46.0)
Hemoglobin: 11.8 g/dL — ABNORMAL LOW (ref 12.0–15.0)
Immature Granulocytes: 0 %
Lymphocytes Relative: 44 %
Lymphs Abs: 1.9 10*3/uL (ref 0.7–4.0)
MCH: 26.9 pg (ref 26.0–34.0)
MCHC: 31.9 g/dL (ref 30.0–36.0)
MCV: 84.5 fL (ref 80.0–100.0)
Monocytes Absolute: 0.3 10*3/uL (ref 0.1–1.0)
Monocytes Relative: 8 %
Neutro Abs: 1.9 10*3/uL (ref 1.7–7.7)
Neutrophils Relative %: 44 %
Platelet Count: 262 10*3/uL (ref 150–400)
RBC: 4.38 MIL/uL (ref 3.87–5.11)
RDW: 14.3 % (ref 11.5–15.5)
WBC Count: 4.2 10*3/uL (ref 4.0–10.5)
nRBC: 0 % (ref 0.0–0.2)

## 2020-10-06 MED ORDER — SODIUM CHLORIDE 0.9 % IV SOLN
2000.0000 mg | Freq: Once | INTRAVENOUS | Status: AC
  Administered 2020-10-06: 2000 mg via INTRAVENOUS
  Filled 2020-10-06: qty 52.6

## 2020-10-06 MED ORDER — SODIUM CHLORIDE 0.9% FLUSH
10.0000 mL | INTRAVENOUS | Status: DC | PRN
  Administered 2020-10-06: 10 mL
  Filled 2020-10-06: qty 10

## 2020-10-06 MED ORDER — PACLITAXEL PROTEIN-BOUND CHEMO INJECTION 100 MG
100.0000 mg/m2 | Freq: Once | INTRAVENOUS | Status: AC
  Administered 2020-10-06: 200 mg via INTRAVENOUS
  Filled 2020-10-06: qty 40

## 2020-10-06 MED ORDER — FENTANYL 75 MCG/HR TD PT72: 1.0000 | MEDICATED_PATCH | TRANSDERMAL | 0 refills | Status: DC

## 2020-10-06 MED ORDER — HEPARIN SOD (PORK) LOCK FLUSH 100 UNIT/ML IV SOLN
500.0000 [IU] | Freq: Once | INTRAVENOUS | Status: AC | PRN
  Administered 2020-10-06: 500 [IU]
  Filled 2020-10-06: qty 5

## 2020-10-06 MED ORDER — PALONOSETRON HCL INJECTION 0.25 MG/5ML
0.2500 mg | Freq: Once | INTRAVENOUS | Status: AC
  Administered 2020-10-06: 0.25 mg via INTRAVENOUS

## 2020-10-06 MED ORDER — HYDROMORPHONE HCL 1 MG/ML IJ SOLN
2.0000 mg | Freq: Once | INTRAMUSCULAR | Status: AC
  Administered 2020-10-06: 2 mg via INTRAVENOUS

## 2020-10-06 MED ORDER — HYDROMORPHONE HCL 1 MG/ML IJ SOLN
INTRAMUSCULAR | Status: AC
  Filled 2020-10-06: qty 2

## 2020-10-06 MED ORDER — PALONOSETRON HCL INJECTION 0.25 MG/5ML
INTRAVENOUS | Status: AC
  Filled 2020-10-06: qty 5

## 2020-10-06 MED ORDER — SODIUM CHLORIDE 0.9 % IV SOLN
10.0000 mg | Freq: Once | INTRAVENOUS | Status: AC
  Administered 2020-10-06: 10 mg via INTRAVENOUS
  Filled 2020-10-06: qty 10

## 2020-10-06 MED ORDER — PB-HYOSCY-ATROPINE-SCOPOLAMINE 16.2 MG/5ML PO ELIX: 5.0000 mL | ORAL_SOLUTION | Freq: Four times a day (QID) | ORAL | 2 refills | Status: DC

## 2020-10-06 MED ORDER — SODIUM CHLORIDE 0.9 % IV SOLN
Freq: Once | INTRAVENOUS | Status: AC
  Filled 2020-10-06: qty 250

## 2020-10-06 MED FILL — fentaNYL 75 MCG/HR PT72: 75 | 30 days supply | Qty: 10 | Fill #0

## 2020-10-06 NOTE — Patient Instructions (Signed)

## 2020-10-06 NOTE — Progress Notes (Signed)
Hematology and Oncology Follow Up Visit  Angela Wilson [id number] 05-28-1966 55 y.o. 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  She went over there in November to see a granddaughter be warm.  She had a good time over there.  She actually brought back a little gift for me.  I am very humble by this.  She also took pictures of camel's for me.  That she actually had a couple pictures of an albino camel that I was very impressed with.  She is having a bit more in the way of abdominal pain.  I am sure that we are probably seeing some cancer activity because of her lack of therapy for couple months.  However, I really felt that it was very important for her to get over to [place] to see her granddaughter be born.  She is supposed to be on a Duragesic patch.  I am not sure she is really doing this.  I do think that it would be helpful.  I think we will increase the patch to 75 mcg.  Hopefully she will do this.  I we will also try her on Donnatal elixir.  This is an antispasmodic.  I am sure that her cancer is active in the abdomen.  We saw her a couple weeks ago, the CEA level was up to 9.6 and the CA125 was up to 69.  Again, she has not had treatment for couple months.  I think if we get her back onto treatment, she will improve and the pain will get better.  She is not having any vomiting.  She is not constipated.  She has not had no problems with diarrhea.    Currently, her performance status is ECOG 1.     Medications:  Current Outpatient Medications:  .  belladonna-PHENObarbital (DONNATAL) 16.2 MG/5ML ELIX, Take 5 mLs (16.2 mg total) by mouth 4 (four) times daily., Disp: 600 mL, Rfl: 2 .  fentaNYL (DURAGESIC) 75 MCG/HR, Place 1 patch onto the skin  every 3 (three) days., Disp: 10 patch, Rfl: 0 .  albuterol (PROVENTIL HFA;VENTOLIN HFA) 108 (90 Base) MCG/ACT inhaler, Inhale 2 puffs into the lungs every 4 (four) hours as needed for wheezing or shortness of breath. (Patient not taking: No sig reported), Disp: 1 Inhaler, Rfl: 0 .  amLODipine (NORVASC) 10 MG tablet, Take 1 tablet (10 mg total) by mouth daily., Disp: 90 tablet, Rfl: 1 .  aspirin 81 MG chewable tablet, Chew 81 mg by mouth daily. , Disp: , Rfl:  .  cetirizine (ZYRTEC) 10 MG tablet, Take 1 tablet (10 mg total) by mouth daily., Disp: 30 tablet, Rfl: 3 .  CREON 36000-114000 units CPEP capsule, , Disp: , Rfl:  .  dronabinol (MARINOL) 2.5 MG capsule, Take 1 capsule (2.5 mg total) by mouth 2 (two) times daily before lunch and supper., Disp: 60 capsule, Rfl: 0 .  ergocalciferol (VITAMIN D2) 1.25 MG (50000 UT) capsule, Take 50,000 Units by mouth once a week. , Disp: , Rfl:  .  glipiZIDE (GLUCOTROL) 10 MG tablet, Take 1 tablet (10 mg total) by mouth 2 (two) times daily., Disp: 60 tablet, Rfl: 1 .  HYDROcodone-homatropine (HYCODAN) 5-1.5 MG/5ML syrup, Take 5 mLs by mouth every 6 (six) hours as needed  for cough., Disp: 120 mL, Rfl: 0 .  HYDROmorphone (DILAUDID) 2 MG tablet, Take 0.5 tablets (1 mg total) by mouth every 6 (six) hours as needed for severe pain., Disp: 60 tablet, Rfl: 0 .  lidocaine-prilocaine (EMLA) cream, Apply to port  1 hour before access., Disp: 30 g, Rfl: 3 .  lisinopril (ZESTRIL) 10 MG tablet, Take 1 tablet (10 mg total) by mouth daily., Disp: 90 tablet, Rfl: 1 .  magic mouthwash w/lidocaine SOLN, Take 5 mLs by mouth 3 (three) times daily as needed for mouth pain., Disp: 450 mL, Rfl: 1 .  metFORMIN (GLUCOPHAGE XR) 500 MG 24 hr tablet, Take 1 tablet (500 mg total) by mouth daily with breakfast., Disp: 90 tablet, Rfl: 1 .  metoCLOPramide (REGLAN) 5 MG tablet, Take 1 tablet (5 mg total) by mouth 4 (four) times daily -  before meals and at bedtime., Disp: 45 tablet, Rfl: 3 .   omeprazole (PRILOSEC) 40 MG capsule, Take 1 capsule (40 mg total) by mouth daily., Disp: 90 capsule, Rfl: 2 .  ondansetron (ZOFRAN) 4 MG tablet, Take 1 tablet (4 mg total) by mouth every 6 (six) hours as needed for nausea., Disp: 20 tablet, Rfl: 0 .  Oxycodone HCl 10 MG TABS, Take 1 tablet (10 mg total) by mouth every 6 (six) hours as needed., Disp: 90 tablet, Rfl: 0 .  polyethylene glycol (MIRALAX / GLYCOLAX) 17 g packet, Take 17 g by mouth daily as needed., Disp: 14 each, Rfl: 0 .  pravastatin (PRAVACHOL) 40 MG tablet, Take 40 mg by mouth daily., Disp: , Rfl:  .  prochlorperazine (COMPAZINE) 10 MG tablet, Take 1 tablet (10 mg total) by mouth every 6 (six) hours as needed for nausea or vomiting., Disp: 90 tablet, Rfl: 3 .  Pyridoxine HCl (VITAMIN B-6) 250 MG tablet, Take 1 tablet (250 mg total) by mouth daily., Disp: 30 tablet, Rfl: 6 .  rivaroxaban (XARELTO) 10 MG TABS tablet, Take 1 tablet (10 mg total) by mouth daily., Disp: 60 tablet, Rfl: 0 No current facility-administered medications for this visit.  Facility-Administered Medications Ordered in Other Visits:  .  gemcitabine (GEMZAR) 2,000 mg in sodium chloride 0.9 % 250 mL chemo infusion, 2,000 mg, Intravenous, Once, Randal Goens, Rudell Cobb, MD, Last Rate: 605 mL/hr at 10/06/20 1336, 2,000 mg at 10/06/20 1336 .  heparin lock flush 100 unit/mL, 500 Units, Intracatheter, Once PRN, Manuelito Poage, Rudell Cobb, MD .  sodium chloride flush (NS) 0.9 % injection 10 mL, 10 mL, Intracatheter, PRN, Volanda Napoleon, MD  Allergies:  Allergies  Allergen Reactions  . Iodinated Diagnostic Agents Itching and Rash  . Ivp Dye [Iodinated Diagnostic Agents] Itching and Rash  . Morphine And Related Hives  . Penicillins Rash    Past Medical History, Surgical history, Social history, and Family History were reviewed and updated.  Review of Systems: Review of Systems  Constitutional: Negative.   HENT:  Negative.   Eyes: Negative.   Respiratory: Negative.    Cardiovascular: Negative.   Gastrointestinal: Positive for abdominal pain and nausea.  Endocrine: Negative.   Genitourinary: Negative.    Musculoskeletal: Negative.   Skin: Negative.   Neurological: Negative.   Hematological: Negative.   Psychiatric/Behavioral: Negative.     Physical Exam:  vitals were not taken for this visit.   Wt Readings from Last 3 Encounters:  09/22/20 208 lb 4 oz (94.5 kg)  07/21/20 217 lb 4 oz (98.5 kg)  06/22/20 213 lb (96.6 kg)   Her vital signs show  a temperature of 97.1.  Pulse is 100.  Blood pressure 116/70.  Weight is 230 pounds.  Physical Exam Vitals reviewed.  HENT:     Head: Normocephalic and atraumatic.  Eyes:     Pupils: Pupils are equal, round, and reactive to light.  Cardiovascular:     Rate and Rhythm: Normal rate and regular rhythm.     Heart sounds: Normal heart sounds.  Pulmonary:     Effort: Pulmonary effort is normal.     Breath sounds: Normal breath sounds.  Abdominal:     General: Bowel sounds are normal.     Palpations: Abdomen is soft.  Musculoskeletal:        General: No tenderness or deformity. Normal range of motion.     Cervical back: Normal range of motion.  Lymphadenopathy:     Cervical: No cervical adenopathy.  Skin:    General: Skin is warm and dry.     Findings: No erythema or rash.  Neurological:     Mental Status: She is alert and oriented to person, place, and time.  Psychiatric:        Behavior: Behavior normal.        Thought Content: Thought content normal.        Judgment: Judgment normal.      Lab Results  Component Value Date   WBC 4.2 10/06/2020   HGB 11.8 (L) 10/06/2020   HCT 37.0 10/06/2020   MCV 84.5 10/06/2020   PLT 262 10/06/2020     Chemistry      Component Value Date/Time   NA 135 10/06/2020 1030   NA 136 01/07/2020 1256   K 4.1 10/06/2020 1030   CL 101 10/06/2020 1030   CO2 27 10/06/2020 1030   BUN 12 10/06/2020 1030   BUN 10 01/07/2020 1256   CREATININE 0.58 10/06/2020  1030      Component Value Date/Time   CALCIUM 9.7 10/06/2020 1030   ALKPHOS 45 10/06/2020 1030   AST 14 (L) 10/06/2020 1030   ALT 11 10/06/2020 1030   BILITOT 0.5 10/06/2020 1030       Impression and Plan: Angela Wilson is a very nice 55 year old woman from [place].  She has metastatic pancreatic cancer.  By the tumor markers, she seems to be responding.  CT scan shows that everything is stable which is always a good thing.  Again, I am very happy that she made it over to [place].  This really has helped her.  We will certainly try to work on this abdominal pain.  I again, I am sure that this is all secondary to malignancy.  Again if we can just get her back on treatment, I would think that everything will improve.  We will plan to get her back to see [place] in another 3 weeks.    Volanda Napoleon, MD 1/18/20221:47 PM

## 2020-10-06 NOTE — Telephone Encounter (Signed)
Per 1-18 los pts appts for 2/1 have been cancelled and 2-8 has been adjusted to add in appt with dr Marin Olp, pt to view on my chart   aom

## 2020-10-06 NOTE — Progress Notes (Signed)
Delayed date was 09/28/20, day 8 will be 1/18.

## 2020-10-07 ENCOUNTER — Telehealth (INDEPENDENT_AMBULATORY_CARE_PROVIDER_SITE_OTHER): Payer: Self-pay | Admitting: Family Medicine

## 2020-10-07 ENCOUNTER — Encounter: Payer: Self-pay | Admitting: *Deleted

## 2020-10-07 ENCOUNTER — Encounter: Payer: Self-pay | Admitting: Family Medicine

## 2020-10-07 DIAGNOSIS — E7849 Other hyperlipidemia: Secondary | ICD-10-CM

## 2020-10-07 DIAGNOSIS — Z794 Long term (current) use of insulin: Secondary | ICD-10-CM

## 2020-10-07 DIAGNOSIS — E1165 Type 2 diabetes mellitus with hyperglycemia: Secondary | ICD-10-CM

## 2020-10-07 DIAGNOSIS — K219 Gastro-esophageal reflux disease without esophagitis: Secondary | ICD-10-CM

## 2020-10-07 DIAGNOSIS — I10 Essential (primary) hypertension: Secondary | ICD-10-CM

## 2020-10-07 DIAGNOSIS — E559 Vitamin D deficiency, unspecified: Secondary | ICD-10-CM

## 2020-10-07 MED ORDER — OMEPRAZOLE 40 MG PO CPDR: 40.0000 mg | DELAYED_RELEASE_CAPSULE | Freq: Every day | ORAL | 2 refills | Status: DC

## 2020-10-07 MED ORDER — METFORMIN HCL ER 500 MG PO TB24: 500.0000 mg | ORAL_TABLET | Freq: Every day | ORAL | 1 refills | Status: DC

## 2020-10-07 MED ORDER — PRAVASTATIN SODIUM 40 MG PO TABS: 40.0000 mg | ORAL_TABLET | Freq: Every day | ORAL | 3 refills | Status: DC

## 2020-10-07 MED ORDER — ERGOCALCIFEROL 1.25 MG (50000 UT) PO CAPS: 50000.0000 [IU] | ORAL_CAPSULE | ORAL | 3 refills | Status: AC

## 2020-10-07 MED ORDER — GLIPIZIDE 10 MG PO TABS: 10.0000 mg | ORAL_TABLET | Freq: Two times a day (BID) | ORAL | 1 refills | Status: DC

## 2020-10-07 MED ORDER — LISINOPRIL 10 MG PO TABS: 10.0000 mg | ORAL_TABLET | Freq: Every day | ORAL | 1 refills | Status: DC

## 2020-10-07 MED ORDER — AMLODIPINE BESYLATE 5 MG PO TABS: 5.0000 mg | ORAL_TABLET | Freq: Every day | ORAL | 3 refills | Status: DC

## 2020-10-07 NOTE — Progress Notes (Signed)
Virtual Visit Note  I connected with patient on 10/07/20 at telephone due to unable to work Epic video visit and verified that I am speaking with the correct person using two identifiers. Angela Wilson is currently located at home and daughter is currently with them during visit. The provider, Laurita Quint Jamarien Rodkey, FNP is located in their office at time of visit.  I discussed the limitations, risks, security and privacy concerns of performing an evaluation and management service by telephone and the availability of in person appointments. I also discussed with the patient that there may be a patient responsible charge related to this service. The patient expressed understanding and agreed to proceed.   I provided 30 minutes of non-face-to-face time during this encounter.  Chief Complaint  Patient presents with  . Transitions Of Care    Patient is currently undergoing chemo treatments once a week   . Hypertension    Recent home readings 119/59 states skips amlodipine with chemo treatments     HPI Patient Care Team: Lashon Beringer, Laurita Quint, FNP as PCP - General (Family Medicine) Pcp, No Ennever, Rudell Cobb, MD as Medical Oncologist (Oncology) Cordelia Poche, RN as Oncology Nurse Navigator ?  PMHX: HTN, GERD DM, HLD, Pancreatic Cancer Originally from [place]. Recently visited.   Dr. Marin Olp: Oncology Chemo once a week: she completed 7 cycles and then took a break Now restarted chemo with no end date at this time Will plan for another scan Tumor Marker remains elevated Continues to have issues with pain   HTN Lisinopril daily Amlodipine every other day BP Readings from Last 3 Encounters:  10/06/20 129/64  09/22/20 119/62  08/04/20 (!) 121/58   DM Glipizide 10mg  bid  Metformin 500mg  dialy Lab Results  Component Value Date   HGBA1C 8.6 (H) 02/16/2020   Health Maintenance  Topic Date Due  . Hepatitis C Screening  Never done  . PNEUMOCOCCAL POLYSACCHARIDE VACCINE AGE 82-64  HIGH RISK  Never done  . OPHTHALMOLOGY EXAM  Never done  . COVID-19 Vaccine (1) Never done  . TETANUS/TDAP  Never done  . PAP SMEAR-Modifier  Never done  . COLONOSCOPY (Pts 45-88yrs Insurance coverage will need to be confirmed)  Never done  . MAMMOGRAM  Never done  . HEMOGLOBIN A1C  08/18/2020  . INFLUENZA VACCINE  09/13/2022 (Originally 04/19/2020)  . FOOT EXAM  01/06/2021  . HIV Screening  Completed    Allergies  Allergen Reactions  . Iodinated Diagnostic Agents Itching and Rash  . Ivp Dye [Iodinated Diagnostic Agents] Itching and Rash  . Morphine And Related Hives  . Penicillins Rash    Prior to Admission medications   Medication Sig Start Date End Date Taking? Authorizing Provider  albuterol (PROVENTIL HFA;VENTOLIN HFA) 108 (90 Base) MCG/ACT inhaler Inhale 2 puffs into the lungs every 4 (four) hours as needed for wheezing or shortness of breath. 04/01/16  Yes Janne Napoleon, NP  aspirin 81 MG chewable tablet Chew 81 mg by mouth daily.    Yes [provider]  belladonna-PHENObarbital (DONNATAL) 16.2 MG/5ML ELIX Take 5 mLs (16.2 mg total) by mouth 4 (four) times daily. 10/06/20  Yes Volanda Napoleon, MD  cetirizine (ZYRTEC) 10 MG tablet Take 1 tablet (10 mg total) by mouth daily. 04/06/20  Yes Jacelyn Pi, Lilia Argue, MD  CREON [phone] units CPEP capsule  03/01/20  Yes [provider]  dronabinol (MARINOL) 2.5 MG capsule Take 1 capsule (2.5 mg total) by mouth 2 (two) times daily before lunch  and supper. 03/24/20  Yes Volanda Napoleon, MD  ergocalciferol (VITAMIN D2) 1.25 MG (50000 UT) capsule Take 50,000 Units by mouth once a week.  08/25/19  Yes [provider]  fentaNYL (DURAGESIC) 75 MCG/HR Place 1 patch onto the skin every 3 (three) days. 10/06/20  Yes Ennever, Rudell Cobb, MD  glipiZIDE (GLUCOTROL) 10 MG tablet Take 1 tablet (10 mg total) by mouth 2 (two) times daily. 07/23/20  Yes Shakari Qazi, Laurita Quint, FNP  HYDROcodone-homatropine (HYCODAN) 5-1.5 MG/5ML syrup Take 5 mLs  by mouth every 6 (six) hours as needed for cough. 08/05/20  Yes Volanda Napoleon, MD  HYDROmorphone (DILAUDID) 2 MG tablet Take 0.5 tablets (1 mg total) by mouth every 6 (six) hours as needed for severe pain. 09/22/20  Yes Volanda Napoleon, MD  lidocaine-prilocaine (EMLA) cream Apply to port  1 hour before access. 07/21/20  Yes Volanda Napoleon, MD  lisinopril (ZESTRIL) 10 MG tablet Take 1 tablet (10 mg total) by mouth daily. 07/23/20  Yes Ryley Teater, Laurita Quint, FNP  magic mouthwash w/lidocaine SOLN Take 5 mLs by mouth 3 (three) times daily as needed for mouth pain. 07/29/20  Yes [place], Holli Humbles, NP  metFORMIN (GLUCOPHAGE XR) 500 MG 24 hr tablet Take 1 tablet (500 mg total) by mouth daily with breakfast. 07/23/20  Yes Evalina Tabak, Laurita Quint, FNP  omeprazole (PRILOSEC) 40 MG capsule Take 1 capsule (40 mg total) by mouth daily. 07/23/20  Yes Renly Guedes, Laurita Quint, FNP  ondansetron (ZOFRAN) 4 MG tablet Take 1 tablet (4 mg total) by mouth every 6 (six) hours as needed for nausea. 02/26/20  Yes Volanda Napoleon, MD  Oxycodone HCl 10 MG TABS Take 1 tablet (10 mg total) by mouth every 6 (six) hours as needed. 07/21/20  Yes Ennever, Rudell Cobb, MD  polyethylene glycol (MIRALAX / GLYCOLAX) 17 g packet Take 17 g by mouth daily as needed. 02/18/20  Yes Alma Friendly, MD  pravastatin (PRAVACHOL) 40 MG tablet Take 40 mg by mouth daily. 12/27/19  Yes [provider]  prochlorperazine (COMPAZINE) 10 MG tablet Take 1 tablet (10 mg total) by mouth every 6 (six) hours as needed for nausea or vomiting. 03/10/20  Yes Ennever, Rudell Cobb, MD  Pyridoxine HCl (VITAMIN B-6) 250 MG tablet Take 1 tablet (250 mg total) by mouth daily. 06/29/20  Yes Ennever, Rudell Cobb, MD  rivaroxaban (XARELTO) 10 MG TABS tablet Take 1 tablet (10 mg total) by mouth daily. 07/21/20  Yes Volanda Napoleon, MD  amLODipine (NORVASC) 10 MG tablet Take 1 tablet (10 mg total) by mouth daily. Patient not taking: Reported on 10/07/2020 07/23/20   Clenton Esper, Laurita Quint, FNP   metoCLOPramide (REGLAN) 5 MG tablet Take 1 tablet (5 mg total) by mouth 4 (four) times daily -  before meals and at bedtime. 01/07/20 04/06/20  Wendall Mola, NP    Past Medical History:  Diagnosis Date  . Diabetes mellitus without complication ([place])   . Family history of brain cancer   . Family history of breast cancer   . Family history of kidney cancer   . Family history of ovarian cancer   . GERD (gastroesophageal reflux disease)   . Hyperlipidemia   . Hypertension   . Kidney stone   . Pancreatic cancer ([place]) 02/03/2020  . Shingles   . Type 2 diabetes mellitus ([place])     Past Surgical History:  Procedure Laterality Date  . CESAREAN SECTION     x 4  . IR  IMAGING GUIDED PORT INSERTION  02/18/2020  . KIDNEY STONE SURGERY      Social History   Tobacco Use  . Smoking status: Never Smoker  . Smokeless tobacco: Never Used  Substance Use Topics  . Alcohol use: No    Family History  Problem Relation Age of Onset  . Heart attack Maternal Uncle   . Breast cancer Paternal Aunt        dx. in her 29s  . Brain cancer Paternal Uncle 75  . Kidney cancer Cousin        dx. <50  . Ovarian cancer Cousin   . Breast cancer Cousin        dx. "young"  . Ovarian cancer Cousin   . Cancer Cousin        Cancer on the back, dx. in her 18s  . Lung cancer Cousin     Review of Systems  Constitutional: Positive for malaise/fatigue. Negative for chills and fever.  Eyes: Negative for blurred vision and double vision.  Respiratory: Negative for cough, shortness of breath and wheezing.   Cardiovascular: Negative for chest pain, palpitations and leg swelling.  Gastrointestinal: Positive for nausea. Negative for abdominal pain, blood in stool, constipation, diarrhea, heartburn and vomiting.  Genitourinary: Negative for dysuria, frequency and hematuria.  Musculoskeletal: Negative for back pain and joint pain.  Skin: Negative for rash.  Neurological: Negative for dizziness, weakness and  headaches.    Objective  Constitutional:      General: Not in acute distress.    Appearance: Normal appearance. Not ill-appearing.   Pulmonary:     Effort: Pulmonary effort is normal. No respiratory distress.  Neurological:     Mental Status: Alert and oriented to person, place, and time.  Psychiatric:        Mood and Affect: Mood normal.        Behavior: Behavior normal.     ASSESSMENT and PLAN  Problem List Items Addressed This Visit      Cardiovascular and Mediastinum   Essential hypertension, benign   Relevant Medications   amLODipine (NORVASC) 5 MG tablet   lisinopril (ZESTRIL) 10 MG tablet   pravastatin (PRAVACHOL) 40 MG tablet     Digestive   GERD (gastroesophageal reflux disease) - Primary   Relevant Medications   omeprazole (PRILOSEC) 40 MG capsule     Endocrine   Type 2 diabetes mellitus (HCC)   Relevant Medications   glipiZIDE (GLUCOTROL) 10 MG tablet   metFORMIN (GLUCOPHAGE XR) 500 MG 24 hr tablet   lisinopril (ZESTRIL) 10 MG tablet   pravastatin (PRAVACHOL) 40 MG tablet   Other Relevant Orders   Hemoglobin A1c   Ambulatory referral to Ophthalmology    Other Visit Diagnoses    Vitamin D deficiency       Relevant Medications   ergocalciferol (VITAMIN D2) 1.25 MG (50000 UT) capsule   Other hyperlipidemia       Relevant Medications   amLODipine (NORVASC) 5 MG tablet   lisinopril (ZESTRIL) 10 MG tablet   pravastatin (PRAVACHOL) 40 MG tablet   Other Relevant Orders   Lipid Panel      Plan . Chronic conditions stable on current regimen . Will add on labs to next oncology visit . Refills sent . No issues at this time . Declines additional screenings at this time, discussed r/se/b  Return in about 3 months (around 01/05/2021).    The above assessment and management plan was discussed with the patient. The patient verbalized understanding of  and has agreed to the management plan. Patient is aware to call the clinic if symptoms persist or worsen.  Patient is aware when to return to the clinic for a follow-up visit. Patient educated on when it is appropriate to go to the emergency department.     Huston Foley Cellie Dardis, FNP-BC [facility], [place] [address] Ph.  [phone] Fax [phone]

## 2020-10-07 NOTE — Patient Instructions (Addendum)
  Health Maintenance, Female Adopting a healthy lifestyle and getting preventive care are important in promoting health and wellness. Ask your health care provider about:  The right schedule for you to have regular tests and exams.  Things you can do on your own to prevent diseases and keep yourself healthy. What should I know about diet, weight, and exercise? Eat a healthy diet  Eat a diet that includes plenty of vegetables, fruits, low-fat dairy products, and lean protein.  Do not eat a lot of foods that are high in solid fats, added sugars, or sodium.   Maintain a healthy weight Body mass index (BMI) is used to identify weight problems. It estimates body fat based on height and weight. Your health care provider can help determine your BMI and help you achieve or maintain a healthy weight. Get regular exercise Get regular exercise. This is one of the most important things you can do for your health. Most adults should:  Exercise for at least 150 minutes each week. The exercise should increase your heart rate and make you sweat (moderate-intensity exercise).  Do strengthening exercises at least twice a week. This is in addition to the moderate-intensity exercise.  Spend less time sitting. Even light physical activity can be beneficial. Watch cholesterol and blood lipids Have your blood tested for lipids and cholesterol at 55 years of age, then have this test every 5 years. Have your cholesterol levels checked more often if:  Your lipid or cholesterol levels are high.  You are older than 53 years of age.  You are at high risk for heart disease. What should I know about cancer screening? Depending on your health history and family history, you may need to have cancer screening at various ages. This may include screening for:  Breast cancer.  Cervical cancer.  Colorectal cancer.  Skin cancer.  Lung cancer. What should I know about heart disease, diabetes, and high blood  pressure? Blood pressure and heart disease  High blood pressure causes heart disease and increases the risk of stroke. This is more likely to develop in people who have high blood pressure readings, are of African descent, or are overweight.  Have your blood pressure checked: ? Every 3-5 years if you are 18-39 years of age. ? Every year if you are 40 years old or older. Diabetes Have regular diabetes screenings. This checks your fasting blood sugar level. Have the screening done:  Once every three years after age 40 if you are at a normal weight and have a low risk for diabetes.  More often and at a younger age if you are overweight or have a high risk for diabetes. What should I know about preventing infection? Hepatitis B If you have a higher risk for hepatitis B, you should be screened for this virus. Talk with your health care provider to find out if you are at risk for hepatitis B infection. Hepatitis C Testing is recommended for:  Everyone born from 1945 through 1965.  Anyone with known risk factors for hepatitis C. Sexually transmitted infections (STIs)  Get screened for STIs, including gonorrhea and chlamydia, if: ? You are sexually active and are younger than 53 years of age. ? You are older than 53 years of age and your health care provider tells you that you are at risk for this type of infection. ? Your sexual activity has changed since you were last screened, and you are at increased risk for chlamydia or gonorrhea. Ask your health   care provider if you are at risk.  Ask your health care provider about whether you are at high risk for HIV. Your health care provider may recommend a prescription medicine to help prevent HIV infection. If you choose to take medicine to prevent HIV, you should first get tested for HIV. You should then be tested every 3 months for as long as you are taking the medicine. Pregnancy  If you are about to stop having your period (premenopausal) and  you may become pregnant, seek counseling before you get pregnant.  Take 400 to 800 micrograms (mcg) of folic acid every day if you become pregnant.  Ask for birth control (contraception) if you want to prevent pregnancy. Osteoporosis and menopause Osteoporosis is a disease in which the bones lose minerals and strength with aging. This can result in bone fractures. If you are 65 years old or older, or if you are at risk for osteoporosis and fractures, ask your health care provider if you should:  Be screened for bone loss.  Take a calcium or vitamin D supplement to lower your risk of fractures.  Be given hormone replacement therapy (HRT) to treat symptoms of menopause. Follow these instructions at home: Lifestyle  Do not use any products that contain nicotine or tobacco, such as cigarettes, e-cigarettes, and chewing tobacco. If you need help quitting, ask your health care provider.  Do not use street drugs.  Do not share needles.  Ask your health care provider for help if you need support or information about quitting drugs. Alcohol use  Do not drink alcohol if: ? Your health care provider tells you not to drink. ? You are pregnant, may be pregnant, or are planning to become pregnant.  If you drink alcohol: ? Limit how much you use to 0-1 drink a day. ? Limit intake if you are breastfeeding.  Be aware of how much alcohol is in your drink. In the [place] one drink equals one 12 oz bottle of beer (355 mL), one 5 oz glass of wine (148 mL), or one 1 oz glass of hard liquor (44 mL). General instructions  Schedule regular health, dental, and eye exams.  Stay current with your vaccines.  Tell your health care provider if: ? You often feel depressed. ? You have ever been abused or do not feel safe at home. Summary  Adopting a healthy lifestyle and getting preventive care are important in promoting health and wellness.  Follow your health care provider's instructions about healthy  diet, exercising, and getting tested or screened for diseases.  Follow your health care provider's instructions on monitoring your cholesterol and blood pressure. This information is not intended to replace advice given to you by your health care provider. Make sure you discuss any questions you have with your health care provider. Document Revised: 08/29/2018 Document Reviewed: 08/29/2018 [employer] Patient Education  2021 [employer]   If you have lab work done today you will be contacted with your lab results within the next 2 weeks.  If you have not heard from us then please contact us. The fastest way to get your results is to register for My Chart.   IF you received an x-ray today, you will receive an invoice from [facility]. Please contact [facility] at [phone] with questions or concerns regarding your invoice.   IF you received labwork today, you will receive an invoice from [facility]. Please contact [facility] at [phone] with questions or concerns regarding your invoice.   Our billing   staff will not be able to assist you with questions regarding bills from these companies.  You will be contacted with the lab results as soon as they are available. The fastest way to get your results is to activate your My Chart account. Instructions are located on the last page of this paperwork. If you have not heard from us regarding the results in 2 weeks, please contact this office.      

## 2020-10-13 ENCOUNTER — Other Ambulatory Visit: Payer: Self-pay | Admitting: *Deleted

## 2020-10-13 DIAGNOSIS — C251 Malignant neoplasm of body of pancreas: Secondary | ICD-10-CM

## 2020-10-13 DIAGNOSIS — C252 Malignant neoplasm of tail of pancreas: Secondary | ICD-10-CM

## 2020-10-13 DIAGNOSIS — C253 Malignant neoplasm of pancreatic duct: Secondary | ICD-10-CM

## 2020-10-14 ENCOUNTER — Inpatient Hospital Stay: Payer: 59

## 2020-10-14 ENCOUNTER — Other Ambulatory Visit: Payer: Self-pay

## 2020-10-14 VITALS — BP 120/75 | HR 91 | Temp 97.9°F | Resp 18

## 2020-10-14 DIAGNOSIS — C251 Malignant neoplasm of body of pancreas: Secondary | ICD-10-CM

## 2020-10-14 DIAGNOSIS — C253 Malignant neoplasm of pancreatic duct: Secondary | ICD-10-CM

## 2020-10-14 DIAGNOSIS — C252 Malignant neoplasm of tail of pancreas: Secondary | ICD-10-CM

## 2020-10-14 DIAGNOSIS — Z5111 Encounter for antineoplastic chemotherapy: Secondary | ICD-10-CM | POA: Diagnosis not present

## 2020-10-14 LAB — CMP (CANCER CENTER ONLY)
ALT: 22 U/L (ref 0–44)
AST: 17 U/L (ref 15–41)
Albumin: 3.6 g/dL (ref 3.5–5.0)
Alkaline Phosphatase: 48 U/L (ref 38–126)
Anion gap: 6 (ref 5–15)
BUN: 12 mg/dL (ref 6–20)
CO2: 28 mmol/L (ref 22–32)
Calcium: 9.7 mg/dL (ref 8.9–10.3)
Chloride: 101 mmol/L (ref 98–111)
Creatinine: 0.55 mg/dL (ref 0.44–1.00)
GFR, Estimated: >60 mL/min (ref >60)
Glucose, Bld: 136 mg/dL — ABNORMAL HIGH (ref 70–99)
Potassium: 4.3 mmol/L (ref 3.5–5.1)
Sodium: 135 mmol/L (ref 135–145)
Total Bilirubin: 0.3 mg/dL (ref 0.3–1.2)
Total Protein: 7 g/dL (ref 6.5–8.1)

## 2020-10-14 LAB — CBC WITH DIFFERENTIAL (CANCER CENTER ONLY)
Abs Immature Granulocytes: 0.02 10*3/uL (ref 0.00–0.07)
Basophils Absolute: 0 10*3/uL (ref 0.0–0.1)
Basophils Relative: 0 %
Eosinophils Absolute: 0.1 10*3/uL (ref 0.0–0.5)
Eosinophils Relative: 2 %
HCT: 34.7 % — ABNORMAL LOW (ref 36.0–46.0)
Hemoglobin: 11.1 g/dL — ABNORMAL LOW (ref 12.0–15.0)
Immature Granulocytes: 1 %
Lymphocytes Relative: 42 %
Lymphs Abs: 1.8 10*3/uL (ref 0.7–4.0)
MCH: 26.9 pg (ref 26.0–34.0)
MCHC: 32 g/dL (ref 30.0–36.0)
MCV: 84 fL (ref 80.0–100.0)
Monocytes Absolute: 0.3 10*3/uL (ref 0.1–1.0)
Monocytes Relative: 7 %
Neutro Abs: 2.1 10*3/uL (ref 1.7–7.7)
Neutrophils Relative %: 48 %
Platelet Count: 167 10*3/uL (ref 150–400)
RBC: 4.13 MIL/uL (ref 3.87–5.11)
RDW: 14.2 % (ref 11.5–15.5)
WBC Count: 4.3 10*3/uL (ref 4.0–10.5)
nRBC: 0 % (ref 0.0–0.2)

## 2020-10-14 MED ORDER — SODIUM CHLORIDE 0.9% FLUSH
10.0000 mL | INTRAVENOUS | Status: DC | PRN
  Administered 2020-10-14: 10 mL
  Filled 2020-10-14: qty 10

## 2020-10-14 MED ORDER — PALONOSETRON HCL INJECTION 0.25 MG/5ML
INTRAVENOUS | Status: AC
  Filled 2020-10-14: qty 5

## 2020-10-14 MED ORDER — PALONOSETRON HCL INJECTION 0.25 MG/5ML
0.2500 mg | Freq: Once | INTRAVENOUS | Status: AC
  Administered 2020-10-14: 0.25 mg via INTRAVENOUS

## 2020-10-14 MED ORDER — HEPARIN SOD (PORK) LOCK FLUSH 100 UNIT/ML IV SOLN
500.0000 [IU] | Freq: Once | INTRAVENOUS | Status: AC | PRN
  Administered 2020-10-14: 500 [IU]
  Filled 2020-10-14: qty 5

## 2020-10-14 MED ORDER — SODIUM CHLORIDE 0.9 % IV SOLN
10.0000 mg | Freq: Once | INTRAVENOUS | Status: AC
  Administered 2020-10-14: 10 mg via INTRAVENOUS
  Filled 2020-10-14: qty 10

## 2020-10-14 MED ORDER — SODIUM CHLORIDE 0.9% FLUSH
10.0000 mL | Freq: Once | INTRAVENOUS | Status: AC
  Administered 2020-10-14: 10 mL
  Filled 2020-10-14: qty 10

## 2020-10-14 MED ORDER — SODIUM CHLORIDE 0.9 % IV SOLN
Freq: Once | INTRAVENOUS | Status: AC
  Filled 2020-10-14: qty 250

## 2020-10-14 MED ORDER — PACLITAXEL PROTEIN-BOUND CHEMO INJECTION 100 MG
100.0000 mg/m2 | Freq: Once | INTRAVENOUS | Status: AC
  Administered 2020-10-14: 200 mg via INTRAVENOUS
  Filled 2020-10-14: qty 40

## 2020-10-14 MED ORDER — SODIUM CHLORIDE 0.9 % IV SOLN
2000.0000 mg | Freq: Once | INTRAVENOUS | Status: AC
  Administered 2020-10-14: 2000 mg via INTRAVENOUS
  Filled 2020-10-14: qty 52.6

## 2020-10-14 NOTE — Patient Instructions (Addendum)
[facility] Discharge Instructions for Patients Receiving Chemotherapy  Today you received the following chemotherapy agents Gemzar,Abraxane  To help prevent nausea and vomiting after your treatment, we encourage you to take your nausea medication as prescribed by MD.    If you develop nausea and vomiting that is not controlled by your nausea medication, call the clinic.   BELOW ARE SYMPTOMS THAT SHOULD BE REPORTED IMMEDIATELY:  *FEVER GREATER THAN 100.5 F  *CHILLS WITH OR WITHOUT FEVER  NAUSEA AND VOMITING THAT IS NOT CONTROLLED WITH YOUR NAUSEA MEDICATION  *UNUSUAL SHORTNESS OF BREATH  *UNUSUAL BRUISING OR BLEEDING  TENDERNESS IN MOUTH AND THROAT WITH OR WITHOUT PRESENCE OF ULCERS  *URINARY PROBLEMS  *BOWEL PROBLEMS  UNUSUAL RASH Items with * indicate a potential emergency and should be followed up as soon as possible.  Feel free to call the clinic should you have any questions or concerns. The clinic phone number is [phone].  Please show the [place] [facility] at check-in to the Emergency Department and triage nurse.  Dexamethasone injection What is this medicine? DEXAMETHASONE (dex a METH a sone) is a corticosteroid. It is used to treat inflammation of the skin, joints, lungs, and other organs. Common conditions treated include asthma, allergies, and arthritis. It is also used for other conditions, like blood disorders and diseases of the adrenal glands. This medicine may be used for other purposes; ask your health care provider or pharmacist if you have questions. COMMON BRAND NAME(S): Decadron, DoubleDex, ReadySharp Dexamethasone, Simplist Dexamethasone, Solurex What should I tell my health care provider before I take this medicine? They need to know if you have any of these conditions: Cushing's syndrome diabetes glaucoma heart disease high blood pressure infection like herpes, measles, tuberculosis, or chickenpox kidney disease liver  disease mental illness myasthenia gravis osteoporosis previous heart attack seizures stomach or intestine problems thyroid disease an unusual or allergic reaction to dexamethasone, corticosteroids, other medicines, lactose, foods, dyes, or preservatives pregnant or trying to get pregnant breast-feeding How should I use this medicine? This medicine is for injection into a muscle, joint, lesion, soft tissue, or vein. It is given by a health care professional in a hospital or clinic setting. Talk to your pediatrician regarding the use of this medicine in children. Special care may be needed. Overdosage: If you think you have taken too much of this medicine contact a poison control center or emergency room at once. NOTE: This medicine is only for you. Do not share this medicine with others. What if I miss a dose? This may not apply. If you are having a series of injections over a prolonged period, try not to miss an appointment. Call your doctor or health care professional to reschedule if you are unable to keep an appointment. What may interact with this medicine? Do not take this medicine with any of the following medications: live virus vaccines This medicine may also interact with the following medications: aminoglutethimide amphotericin B aspirin and aspirin-like medicines certain antibiotics like erythromycin, clarithromycin, and troleandomycin certain antivirals for HIV or hepatitis certain medicines for seizures like carbamazepine, phenobarbital, phenytoin certain medicines to treat myasthenia gravis cholestyramine cyclosporine digoxin diuretics ephedrine female hormones, like estrogen or progestins and birth control pills insulin or other medicines for diabetes isoniazid ketoconazole medicines that relax muscles for surgery mifepristone NSAIDs, medicines for pain and inflammation, like ibuprofen or naproxen rifampin skin tests for  allergies thalidomide vaccines warfarin This list may not describe all possible interactions. Give your health care  provider a list of all the medicines, herbs, non-prescription drugs, or dietary supplements you use. Also tell them if you smoke, drink alcohol, or use illegal drugs. Some items may interact with your medicine. What should I watch for while using this medicine? Visit your health care professional for regular checks on your progress. Tell your health care professional if your symptoms do not start to get better or if they get worse. Your condition will be monitored carefully while you are receiving this medicine. Wear a medical ID bracelet or chain. Carry a card that describes your disease and details of your medicine and dosage times. This medicine may increase your risk of getting an infection. Call your health care professional for advice if you get a fever, chills, or sore throat, or other symptoms of a cold or flu. Do not treat yourself. Try to avoid being around people who are sick. Call your health care professional if you are around anyone with measles, chickenpox, or if you develop sores or blisters that do not heal properly. If you are going to need surgery or other procedures, tell your doctor or health care professional that you have taken this medicine within the last 12 months. Ask your doctor or health care professional about your diet. You may need to lower the amount of salt you eat. This medicine may increase blood sugar. Ask your healthcare provider if changes in diet or medicines are needed if you have diabetes. What side effects may I notice from receiving this medicine? Side effects that you should report to your doctor or health care professional as soon as possible: allergic reactions like skin rash, itching or hives, swelling of the face, lips, or tongue bloody or black, tarry stools changes in emotions or moods changes in vision confusion, excitement,  restlessness depressed mood eye pain hallucinations muscle weakness severe or sudden stomach or belly pain signs and symptoms of high blood sugar such as being more thirsty or hungry or having to urinate more than normal. You may also feel very tired or have blurry vision. signs and symptoms of infection like fever; chills; cough; sore throat; pain or trouble passing urine swelling of ankles, feet unusual bruising or bleeding wounds that do not heal Side effects that usually do not require medical attention (report to your doctor or health care professional if they continue or are bothersome): increased appetite increased growth of face or body hair headache nausea, vomiting pain, redness, or irritation at site where injected skin problems, acne, thin and shiny skin trouble sleeping weight gain This list may not describe all possible side effects. Call your doctor for medical advice about side effects. You may report side effects to FDA at 1-800-FDA-1088. Where should I keep my medicine? This medicine is given in a hospital or clinic and will not be stored at home. NOTE: This sheet is a summary. It may not cover all possible information. If you have questions about this medicine, talk to your doctor, pharmacist, or health care provider.  2021 Elsevier/Gold Standard (2019-03-19 13:51:58) Palonosetron Injection What is this medicine? PALONOSETRON (pal oh NOE se tron) is used to prevent nausea and vomiting caused by chemotherapy. It also helps prevent delayed nausea and vomiting that may occur a few days after your treatment. This medicine may be used for other purposes; ask your health care provider or pharmacist if you have questions. COMMON BRAND NAME(S): Aloxi What should I tell my health care provider before I take this medicine? They need to know  if you have any of these conditions: an unusual or allergic reaction to palonosetron, dolasetron, granisetron, ondansetron, other  medicines, foods, dyes, or preservatives pregnant or trying to get pregnant breast-feeding How should I use this medicine? This medicine is for infusion into a vein. It is given by a health care professional in a hospital or clinic setting. Talk to your pediatrician regarding the use of this medicine in children. While this drug may be prescribed for children as young as 1 month for selected conditions, precautions do apply. Overdosage: If you think you have taken too much of this medicine contact a poison control center or emergency room at once. NOTE: This medicine is only for you. Do not share this medicine with others. What if I miss a dose? This does not apply. What may interact with this medicine? certain medicines for depression, anxiety, or psychotic disturbances fentanyl linezolid MAOIs like Carbex, Eldepryl, Marplan, Nardil, and Parnate methylene blue (injected into a vein) tramadol This list may not describe all possible interactions. Give your health care provider a list of all the medicines, herbs, non-prescription drugs, or dietary supplements you use. Also tell them if you smoke, drink alcohol, or use illegal drugs. Some items may interact with your medicine. What should I watch for while using this medicine? Your condition will be monitored carefully while you are receiving this medicine. What side effects may I notice from receiving this medicine? Side effects that you should report to your doctor or health care professional as soon as possible: allergic reactions like skin rash, itching or hives, swelling of the face, lips, or tongue breathing problems confusion dizziness fast, irregular heartbeat fever and chills loss of balance or coordination seizures sweating swelling of the hands and feet tremors unusually weak or tired Side effects that usually do not require medical attention (report to your doctor or health care professional if they continue or are  bothersome): constipation or diarrhea headache This list may not describe all possible side effects. Call your doctor for medical advice about side effects. You may report side effects to FDA at 1-800-FDA-1088. Where should I keep my medicine? This drug is given in a hospital or clinic and will not be stored at home. NOTE: This sheet is a summary. It may not cover all possible information. If you have questions about this medicine, talk to your doctor, pharmacist, or health care provider.  2021 Elsevier/Gold Standard (2013-07-12 10:38:36) Nanoparticle Albumin-Bound Paclitaxel injection What is this medicine? NANOPARTICLE ALBUMIN-BOUND PACLITAXEL (Na no PAHR ti kuhl al BYOO muhn-bound PAK li TAX el) is a chemotherapy drug. It targets fast dividing cells, like cancer cells, and causes these cells to die. This medicine is used to treat advanced breast cancer, lung cancer, and pancreatic cancer. This medicine may be used for other purposes; ask your health care provider or pharmacist if you have questions. COMMON BRAND NAME(S): Abraxane What should I tell my health care provider before I take this medicine? They need to know if you have any of these conditions: kidney disease liver disease low blood counts, like low white cell, platelet, or red cell counts lung or breathing disease, like asthma tingling of the fingers or toes, or other nerve disorder an unusual or allergic reaction to paclitaxel, albumin, other chemotherapy, other medicines, foods, dyes, or preservatives pregnant or trying to get pregnant breast-feeding How should I use this medicine? This drug is given as an infusion into a vein. It is administered in a hospital or clinic by a specially  trained health care professional. Talk to your pediatrician regarding the use of this medicine in children. Special care may be needed. Overdosage: If you think you have taken too much of this medicine contact a poison control center or  emergency room at once. NOTE: This medicine is only for you. Do not share this medicine with others. What if I miss a dose? It is important not to miss your dose. Call your doctor or health care professional if you are unable to keep an appointment. What may interact with this medicine? This medicine may interact with the following medications: antiviral medicines for hepatitis, HIV or AIDS certain antibiotics like erythromycin and clarithromycin certain medicines for fungal infections like ketoconazole and itraconazole certain medicines for seizures like carbamazepine, phenobarbital, phenytoin gemfibrozil nefazodone rifampin St. John's wort This list may not describe all possible interactions. Give your health care provider a list of all the medicines, herbs, non-prescription drugs, or dietary supplements you use. Also tell them if you smoke, drink alcohol, or use illegal drugs. Some items may interact with your medicine. What should I watch for while using this medicine? Your condition will be monitored carefully while you are receiving this medicine. You will need important blood work done while you are taking this medicine. This medicine can cause serious allergic reactions. If you experience allergic reactions like skin rash, itching or hives, swelling of the face, lips, or tongue, tell your doctor or health care professional right away. In some cases, you may be given additional medicines to help with side effects. Follow all directions for their use. This drug may make you feel generally unwell. This is not uncommon, as chemotherapy can affect healthy cells as well as cancer cells. Report any side effects. Continue your course of treatment even though you feel ill unless your doctor tells you to stop. Call your doctor or health care professional for advice if you get a fever, chills or sore throat, or other symptoms of a cold or flu. Do not treat yourself. This drug decreases your body's  ability to fight infections. Try to avoid being around people who are sick. This medicine may increase your risk to bruise or bleed. Call your doctor or health care professional if you notice any unusual bleeding. Be careful brushing and flossing your teeth or using a toothpick because you may get an infection or bleed more easily. If you have any dental work done, tell your dentist you are receiving this medicine. Avoid taking products that contain aspirin, acetaminophen, ibuprofen, naproxen, or ketoprofen unless instructed by your doctor. These medicines may hide a fever. Do not become pregnant while taking this medicine or for 6 months after stopping it. Women should inform their doctor if they wish to become pregnant or think they might be pregnant. Men should not father a child while taking this medicine or for 3 months after stopping it. There is a potential for serious side effects to an unborn child. Talk to your health care professional or pharmacist for more information. Do not breast-feed an infant while taking this medicine or for 2 weeks after stopping it. This medicine may interfere with the ability to get pregnant or to father a child. You should talk to your doctor or health care professional if you are concerned about your fertility. What side effects may I notice from receiving this medicine? Side effects that you should report to your doctor or health care professional as soon as possible: allergic reactions like skin rash, itching or hives,  swelling of the face, lips, or tongue breathing problems changes in vision fast, irregular heartbeat low blood pressure mouth sores pain, tingling, numbness in the hands or feet signs of decreased platelets or bleeding - bruising, pinpoint red spots on the skin, black, tarry stools, blood in the urine signs of decreased red blood cells - unusually weak or tired, feeling faint or lightheaded, falls signs of infection - fever or chills, cough,  sore throat, pain or difficulty passing urine signs and symptoms of liver injury like dark yellow or brown urine; general ill feeling or flu-like symptoms; light-colored stools; loss of appetite; nausea; right upper belly pain; unusually weak or tired; yellowing of the eyes or skin swelling of the ankles, feet, hands unusually slow heartbeat Side effects that usually do not require medical attention (report to your doctor or health care professional if they continue or are bothersome): diarrhea hair loss loss of appetite nausea, vomiting tiredness This list may not describe all possible side effects. Call your doctor for medical advice about side effects. You may report side effects to FDA at 1-800-FDA-1088. Where should I keep my medicine? This drug is given in a hospital or clinic and will not be stored at home. NOTE: This sheet is a summary. It may not cover all possible information. If you have questions about this medicine, talk to your doctor, pharmacist, or health care provider.  2021 Elsevier/Gold Standard (2017-05-09 13:03:45) Gemcitabine injection What is this medicine? GEMCITABINE (jem SYE ta been) is a chemotherapy drug. This medicine is used to treat many types of cancer like breast cancer, lung cancer, pancreatic cancer, and ovarian cancer. This medicine may be used for other purposes; ask your health care provider or pharmacist if you have questions. COMMON BRAND NAME(S): Gemzar, Infugem What should I tell my health care provider before I take this medicine? They need to know if you have any of these conditions: blood disorders infection kidney disease liver disease lung or breathing disease, like asthma recent or ongoing radiation therapy an unusual or allergic reaction to gemcitabine, other chemotherapy, other medicines, foods, dyes, or preservatives pregnant or trying to get pregnant breast-feeding How should I use this medicine? This drug is given as an infusion  into a vein. It is administered in a hospital or clinic by a specially trained health care professional. Talk to your pediatrician regarding the use of this medicine in children. Special care may be needed. Overdosage: If you think you have taken too much of this medicine contact a poison control center or emergency room at once. NOTE: This medicine is only for you. Do not share this medicine with others. What if I miss a dose? It is important not to miss your dose. Call your doctor or health care professional if you are unable to keep an appointment. What may interact with this medicine? medicines to increase blood counts like filgrastim, pegfilgrastim, sargramostim some other chemotherapy drugs like cisplatin vaccines Talk to your doctor or health care professional before taking any of these medicines: acetaminophen aspirin ibuprofen ketoprofen naproxen This list may not describe all possible interactions. Give your health care provider a list of all the medicines, herbs, non-prescription drugs, or dietary supplements you use. Also tell them if you smoke, drink alcohol, or use illegal drugs. Some items may interact with your medicine. What should I watch for while using this medicine? Visit your doctor for checks on your progress. This drug may make you feel generally unwell. This is not uncommon, as chemotherapy can  affect healthy cells as well as cancer cells. Report any side effects. Continue your course of treatment even though you feel ill unless your doctor tells you to stop. In some cases, you may be given additional medicines to help with side effects. Follow all directions for their use. Call your doctor or health care professional for advice if you get a fever, chills or sore throat, or other symptoms of a cold or flu. Do not treat yourself. This drug decreases your body's ability to fight infections. Try to avoid being around people who are sick. This medicine may increase your risk  to bruise or bleed. Call your doctor or health care professional if you notice any unusual bleeding. Be careful brushing and flossing your teeth or using a toothpick because you may get an infection or bleed more easily. If you have any dental work done, tell your dentist you are receiving this medicine. Avoid taking products that contain aspirin, acetaminophen, ibuprofen, naproxen, or ketoprofen unless instructed by your doctor. These medicines may hide a fever. Do not become pregnant while taking this medicine or for 6 months after stopping it. Women should inform their doctor if they wish to become pregnant or think they might be pregnant. Men should not father a child while taking this medicine and for 3 months after stopping it. There is a potential for serious side effects to an unborn child. Talk to your health care professional or pharmacist for more information. Do not breast-feed an infant while taking this medicine or for at least 1 week after stopping it. Men should inform their doctors if they wish to father a child. This medicine may lower sperm counts. Talk with your doctor or health care professional if you are concerned about your fertility. What side effects may I notice from receiving this medicine? Side effects that you should report to your doctor or health care professional as soon as possible: allergic reactions like skin rash, itching or hives, swelling of the face, lips, or tongue breathing problems pain, redness, or irritation at site where injected signs and symptoms of a dangerous change in heartbeat or heart rhythm like chest pain; dizziness; fast or irregular heartbeat; palpitations; feeling faint or lightheaded, falls; breathing problems signs of decreased platelets or bleeding - bruising, pinpoint red spots on the skin, black, tarry stools, blood in the urine signs of decreased red blood cells - unusually weak or tired, feeling faint or lightheaded, falls signs of  infection - fever or chills, cough, sore throat, pain or difficulty passing urine signs and symptoms of kidney injury like trouble passing urine or change in the amount of urine signs and symptoms of liver injury like dark yellow or brown urine; general ill feeling or flu-like symptoms; light-colored stools; loss of appetite; nausea; right upper belly pain; unusually weak or tired; yellowing of the eyes or skin swelling of ankles, feet, hands Side effects that usually do not require medical attention (report to your doctor or health care professional if they continue or are bothersome): constipation diarrhea hair loss loss of appetite nausea rash vomiting This list may not describe all possible side effects. Call your doctor for medical advice about side effects. You may report side effects to FDA at 1-800-FDA-1088. Where should I keep my medicine? This drug is given in a hospital or clinic and will not be stored at home. NOTE: This sheet is a summary. It may not cover all possible information. If you have questions about this medicine, talk to your  doctor, pharmacist, or health care provider.  2021 Elsevier/Gold Standard (2017-11-29 18:06:11)

## 2020-10-14 NOTE — Patient Instructions (Signed)

## 2020-10-20 ENCOUNTER — Encounter: Payer: Self-pay | Admitting: *Deleted

## 2020-10-20 ENCOUNTER — Ambulatory Visit: Payer: Self-pay | Admitting: Hematology & Oncology

## 2020-10-20 ENCOUNTER — Other Ambulatory Visit: Payer: Self-pay

## 2020-10-20 ENCOUNTER — Ambulatory Visit: Payer: Self-pay

## 2020-10-27 ENCOUNTER — Other Ambulatory Visit: Payer: Self-pay

## 2020-10-27 ENCOUNTER — Encounter: Payer: Self-pay | Admitting: *Deleted

## 2020-10-27 ENCOUNTER — Inpatient Hospital Stay: Payer: 59 | Attending: Hematology & Oncology

## 2020-10-27 ENCOUNTER — Inpatient Hospital Stay (HOSPITAL_BASED_OUTPATIENT_CLINIC_OR_DEPARTMENT_OTHER): Payer: 59 | Admitting: Hematology & Oncology

## 2020-10-27 ENCOUNTER — Encounter: Payer: Self-pay | Admitting: Hematology & Oncology

## 2020-10-27 ENCOUNTER — Other Ambulatory Visit (HOSPITAL_BASED_OUTPATIENT_CLINIC_OR_DEPARTMENT_OTHER): Payer: Self-pay | Admitting: Hematology & Oncology

## 2020-10-27 ENCOUNTER — Inpatient Hospital Stay: Payer: 59

## 2020-10-27 VITALS — BP 129/65 | HR 102 | Temp 98.7°F | Resp 18 | Wt 212.0 lb

## 2020-10-27 VITALS — HR 96

## 2020-10-27 DIAGNOSIS — J910 Malignant pleural effusion: Secondary | ICD-10-CM | POA: Diagnosis not present

## 2020-10-27 DIAGNOSIS — G893 Neoplasm related pain (acute) (chronic): Secondary | ICD-10-CM | POA: Insufficient documentation

## 2020-10-27 DIAGNOSIS — C253 Malignant neoplasm of pancreatic duct: Secondary | ICD-10-CM | POA: Diagnosis not present

## 2020-10-27 DIAGNOSIS — R110 Nausea: Secondary | ICD-10-CM | POA: Diagnosis not present

## 2020-10-27 DIAGNOSIS — Z5111 Encounter for antineoplastic chemotherapy: Secondary | ICD-10-CM | POA: Diagnosis present

## 2020-10-27 DIAGNOSIS — Z79891 Long term (current) use of opiate analgesic: Secondary | ICD-10-CM | POA: Diagnosis not present

## 2020-10-27 DIAGNOSIS — C252 Malignant neoplasm of tail of pancreas: Secondary | ICD-10-CM | POA: Diagnosis not present

## 2020-10-27 LAB — CBC WITH DIFFERENTIAL (CANCER CENTER ONLY)
Abs Immature Granulocytes: 0.03 10*3/uL (ref 0.00–0.07)
Basophils Absolute: 0 10*3/uL (ref 0.0–0.1)
Basophils Relative: 0 %
Eosinophils Absolute: 0.1 10*3/uL (ref 0.0–0.5)
Eosinophils Relative: 3 %
HCT: 35.1 % — ABNORMAL LOW (ref 36.0–46.0)
Hemoglobin: 11.2 g/dL — ABNORMAL LOW (ref 12.0–15.0)
Immature Granulocytes: 1 %
Lymphocytes Relative: 36 %
Lymphs Abs: 1.6 10*3/uL (ref 0.7–4.0)
MCH: 27 pg (ref 26.0–34.0)
MCHC: 31.9 g/dL (ref 30.0–36.0)
MCV: 84.6 fL (ref 80.0–100.0)
Monocytes Absolute: 0.5 10*3/uL (ref 0.1–1.0)
Monocytes Relative: 11 %
Neutro Abs: 2.3 10*3/uL (ref 1.7–7.7)
Neutrophils Relative %: 49 %
Platelet Count: 282 10*3/uL (ref 150–400)
RBC: 4.15 MIL/uL (ref 3.87–5.11)
RDW: 15.4 % (ref 11.5–15.5)
WBC Count: 4.5 10*3/uL (ref 4.0–10.5)
nRBC: 0 % (ref 0.0–0.2)

## 2020-10-27 LAB — CMP (CANCER CENTER ONLY)
ALT: 17 U/L (ref 0–44)
AST: 15 U/L (ref 15–41)
Albumin: 3.8 g/dL (ref 3.5–5.0)
Alkaline Phosphatase: 46 U/L (ref 38–126)
Anion gap: 5 (ref 5–15)
BUN: 13 mg/dL (ref 6–20)
CO2: 28 mmol/L (ref 22–32)
Calcium: 9.4 mg/dL (ref 8.9–10.3)
Chloride: 102 mmol/L (ref 98–111)
Creatinine: 0.6 mg/dL (ref 0.44–1.00)
GFR, Estimated: >60 mL/min (ref >60)
Glucose, Bld: 163 mg/dL — ABNORMAL HIGH (ref 70–99)
Potassium: 4.3 mmol/L (ref 3.5–5.1)
Sodium: 135 mmol/L (ref 135–145)
Total Bilirubin: 0.4 mg/dL (ref 0.3–1.2)
Total Protein: 7 g/dL (ref 6.5–8.1)

## 2020-10-27 LAB — CEA (IN HOUSE-CHCC): CEA (CHCC-In House): 26.04 ng/mL — ABNORMAL HIGH (ref 0.00–5.00)

## 2020-10-27 MED ORDER — SODIUM CHLORIDE 0.9 % IV SOLN
Freq: Once | INTRAVENOUS | Status: DC
  Filled 2020-10-27: qty 250

## 2020-10-27 MED ORDER — FENTANYL 75 MCG/HR TD PT72: 1.0000 | MEDICATED_PATCH | TRANSDERMAL | 0 refills | Status: DC

## 2020-10-27 MED ORDER — PALONOSETRON HCL INJECTION 0.25 MG/5ML
0.2500 mg | Freq: Once | INTRAVENOUS | Status: AC
  Administered 2020-10-27: 0.25 mg via INTRAVENOUS

## 2020-10-27 MED ORDER — SODIUM CHLORIDE 0.9 % IV SOLN
2000.0000 mg | Freq: Once | INTRAVENOUS | Status: AC
  Administered 2020-10-27: 2000 mg via INTRAVENOUS
  Filled 2020-10-27: qty 52.6

## 2020-10-27 MED ORDER — PACLITAXEL PROTEIN-BOUND CHEMO INJECTION 100 MG
100.0000 mg/m2 | Freq: Once | INTRAVENOUS | Status: AC
  Administered 2020-10-27: 200 mg via INTRAVENOUS
  Filled 2020-10-27: qty 40

## 2020-10-27 MED ORDER — PB-HYOSCY-ATROPINE-SCOPOLAMINE 16.2 MG/5ML PO ELIX: 5.0000 mL | ORAL_SOLUTION | Freq: Four times a day (QID) | ORAL | 2 refills | Status: DC

## 2020-10-27 MED ORDER — HYDROCODONE-HOMATROPINE 5-1.5 MG/5ML PO SYRP: 5.0000 mL | ORAL_SOLUTION | Freq: Four times a day (QID) | ORAL | 0 refills | Status: DC | PRN

## 2020-10-27 MED ORDER — HEPARIN SOD (PORK) LOCK FLUSH 100 UNIT/ML IV SOLN
500.0000 [IU] | Freq: Once | INTRAVENOUS | Status: AC | PRN
  Administered 2020-10-27: 500 [IU]
  Filled 2020-10-27: qty 5

## 2020-10-27 MED ORDER — SODIUM CHLORIDE 0.9 % IV SOLN
10.0000 mg | Freq: Once | INTRAVENOUS | Status: AC
  Administered 2020-10-27: 10 mg via INTRAVENOUS
  Filled 2020-10-27: qty 10

## 2020-10-27 MED ORDER — SODIUM CHLORIDE 0.9% FLUSH
10.0000 mL | INTRAVENOUS | Status: DC | PRN
  Administered 2020-10-27: 10 mL
  Filled 2020-10-27: qty 10

## 2020-10-27 MED ORDER — COLD PACK MISC ONCOLOGY
1.0000 | Freq: Once | Status: DC | PRN
  Filled 2020-10-27: qty 1

## 2020-10-27 MED ORDER — HEPARIN SOD (PORK) LOCK FLUSH 100 UNIT/ML IV SOLN
250.0000 [IU] | Freq: Once | INTRAVENOUS | Status: DC | PRN
  Filled 2020-10-27: qty 5

## 2020-10-27 MED ORDER — SODIUM CHLORIDE 0.9 % IV SOLN
Freq: Once | INTRAVENOUS | Status: AC
  Filled 2020-10-27: qty 250

## 2020-10-27 MED ORDER — PALONOSETRON HCL INJECTION 0.25 MG/5ML
INTRAVENOUS | Status: AC
  Filled 2020-10-27: qty 5

## 2020-10-27 MED FILL — HYCODAN 5-1.5 MG/5ML SYRP: 5-1.5 | 10 days supply | Qty: 120 | Fill #0

## 2020-10-27 NOTE — Progress Notes (Signed)
Patient needs to have CT scheduled and to pick up contrast. The son-in-law does not have time to wait today, so he will return tomorrow and go to radiology to schedule and obtain contrast. Radiology department downstairs notified.   Patient's son-in-law also requesting that all appointments be made early in the morning as he needs to work in the afternoon. Message sent to scheduling.   Oncology Nurse Navigator Documentation  Oncology Nurse Navigator Flowsheets 10/27/2020  Confirmed Diagnosis Date -  Diagnosis Status -  Planned Course of Treatment -  Phase of Treatment -  Chemotherapy Pending- Reason: -  Chemotherapy Actual Start Date: -  Navigator Follow Up Date: 11/25/2020  Navigator Follow Up Reason: Follow-up Appointment;Chemotherapy  Navigator Location [facility]  Referral Date to RadOnc/MedOnc -  Navigator Encounter Type Treatment;Appt/Treatment Plan Review  Telephone -  Treatment Initiated Date -  Patient Visit Type MedOnc  Treatment Phase Active Tx  Barriers/Navigation Needs Coordination of Care;Cultural Needs;Family Concerns;Language/Communication  Education Other  Interventions Coordination of Care;Education;Psycho-Social Support  Acuity Level 2-Minimal Needs (1-2 Barriers Identified)  Referrals -  Coordination of Care Radiology  Education Method Verbal  Support Groups/Services Friends and Family  Time Spent with Patient 51

## 2020-10-27 NOTE — Addendum Note (Signed)
Addended by: Burney Gauze R on: 10/27/2020 01:02 PM   Modules accepted: Orders

## 2020-10-27 NOTE — Patient Instructions (Signed)

## 2020-10-27 NOTE — Patient Instructions (Signed)
[facility] Discharge Instructions for Patients Receiving Chemotherapy  Today you received the following chemotherapy agents Paclitaxel, Gemzar.  To help prevent nausea and vomiting after your treatment, we encourage you to take your nausea medication as indicated by your MD>   If you develop nausea and vomiting that is not controlled by your nausea medication, call the clinic.   BELOW ARE SYMPTOMS THAT SHOULD BE REPORTED IMMEDIATELY:  *FEVER GREATER THAN 100.5 F  *CHILLS WITH OR WITHOUT FEVER  NAUSEA AND VOMITING THAT IS NOT CONTROLLED WITH YOUR NAUSEA MEDICATION  *UNUSUAL SHORTNESS OF BREATH  *UNUSUAL BRUISING OR BLEEDING  TENDERNESS IN MOUTH AND THROAT WITH OR WITHOUT PRESENCE OF ULCERS  *URINARY PROBLEMS  *BOWEL PROBLEMS  UNUSUAL RASH Items with * indicate a potential emergency and should be followed up as soon as possible.  Feel free to call the clinic should you have any questions or concerns. The clinic phone number is [phone].  Please show the [facility] at check-in to the Emergency Department and triage nurse.

## 2020-10-27 NOTE — Progress Notes (Signed)
Hematology and Oncology Follow Up Visit  Angela Wilson [id number] November 19, 1965 55 y.o. 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  She still having problems with abdominal pain.  She has a lot of "gas."  I suspect this is probably going to be tumor related.  It would not surprise me if she has peritoneal spread of her malignancy.  We called in some Donnatal for her.  Apparently this was very expensive.  Maybe, we can get this at our pharmacy downstairs.  She is on a fentanyl patch.  She is on hydromorphone.  She says the hydromorphone just makes her tired.  When she gets back up, the pain is there.  She has nausea.  There is no actual vomiting.  She has had no bleeding.  There is no cough or shortness of breath.  Her tumor markers did go up which is no surprise.  The CA 125 went up to 69.  The CEA went up to 9.6.  Hopefully, they are coming back down.  She has had no leg swelling.  There has been no fever.  Currently, her performance status is ECOG 1.     Medications:  Current Outpatient Medications:  .  amLODipine (NORVASC) 5 MG tablet, Take 1 tablet (5 mg total) by mouth daily., Disp: 90 tablet, Rfl: 3 .  aspirin 81 MG chewable tablet, Chew 81 mg by mouth daily. , Disp: , Rfl:  .  cetirizine (ZYRTEC) 10 MG tablet, Take 1 tablet (10 mg total) by mouth daily., Disp: 30 tablet, Rfl: 3 .  CREON 36000-114000 units CPEP capsule, , Disp: , Rfl:  .  dronabinol (MARINOL) 2.5 MG capsule, Take 1 capsule (2.5 mg total) by mouth 2 (two) times daily before lunch and supper., Disp: 60 capsule, Rfl: 0 .  ergocalciferol (VITAMIN D2) 1.25 MG (50000 UT) capsule, Take 1 capsule (50,000 Units total) by mouth once a week., Disp: 6 capsule, Rfl: 3 .  glipiZIDE (GLUCOTROL) 10  MG tablet, Take 1 tablet (10 mg total) by mouth 2 (two) times daily., Disp: 60 tablet, Rfl: 1 .  HYDROmorphone (DILAUDID) 2 MG tablet, Take 0.5 tablets (1 mg total) by mouth every 6 (six) hours as needed for severe pain., Disp: 60 tablet, Rfl: 0 .  lidocaine-prilocaine (EMLA) cream, Apply to port  1 hour before access., Disp: 30 g, Rfl: 3 .  lisinopril (ZESTRIL) 10 MG tablet, Take 1 tablet (10 mg total) by mouth daily., Disp: 90 tablet, Rfl: 1 .  magic mouthwash w/lidocaine SOLN, Take 5 mLs by mouth 3 (three) times daily as needed for mouth pain., Disp: 450 mL, Rfl: 1 .  metFORMIN (GLUCOPHAGE XR) 500 MG 24 hr tablet, Take 1 tablet (500 mg total) by mouth daily with breakfast., Disp: 90 tablet, Rfl: 1 .  omeprazole (PRILOSEC) 40 MG capsule, Take 1 capsule (40 mg total) by mouth daily., Disp: 90 capsule, Rfl: 2 .  ondansetron (ZOFRAN) 4 MG tablet, Take 1 tablet (4 mg total) by mouth every 6 (six) hours as needed for nausea., Disp: 20 tablet, Rfl: 0 .  polyethylene glycol (MIRALAX / GLYCOLAX) 17 g packet, Take 17 g by mouth daily as needed., Disp: 14 each, Rfl: 0 .  prochlorperazine (COMPAZINE) 10 MG tablet, Take 1 tablet (10 mg total) by  mouth every 6 (six) hours as needed for nausea or vomiting., Disp: 90 tablet, Rfl: 3 .  Pyridoxine HCl (VITAMIN B-6) 250 MG tablet, Take 1 tablet (250 mg total) by mouth daily., Disp: 30 tablet, Rfl: 6 .  albuterol (PROVENTIL HFA;VENTOLIN HFA) 108 (90 Base) MCG/ACT inhaler, Inhale 2 puffs into the lungs every 4 (four) hours as needed for wheezing or shortness of breath. (Patient not taking: Reported on 10/27/2020), Disp: 1 Inhaler, Rfl: 0 .  belladonna-PHENObarbital (DONNATAL) 16.2 MG/5ML ELIX, Take 5 mLs (16.2 mg total) by mouth 4 (four) times daily., Disp: 600 mL, Rfl: 2 .  fentaNYL (DURAGESIC) 75 MCG/HR, Place 1 patch onto the skin every 3 (three) days., Disp: 10 patch, Rfl: 0 .  HYDROcodone-homatropine (HYCODAN) 5-1.5 MG/5ML syrup, Take 5 mLs by mouth every 6 (six)  hours as needed for cough., Disp: 120 mL, Rfl: 0 .  metoCLOPramide (REGLAN) 5 MG tablet, Take 1 tablet (5 mg total) by mouth 4 (four) times daily -  before meals and at bedtime., Disp: 45 tablet, Rfl: 3 No current facility-administered medications for this visit.  Facility-Administered Medications Ordered in Other Visits:  .  0.9 %  sodium chloride infusion, , Intravenous, Once, Ennever, Rudell Cobb, MD .  Cold Pack 1 packet, 1 packet, Topical, Once PRN, Ennever, Rudell Cobb, MD .  gemcitabine (GEMZAR) 2,000 mg in sodium chloride 0.9 % 250 mL chemo infusion, 2,000 mg, Intravenous, Once, Ennever, Peter R, MD .  heparin lock flush 100 unit/mL, 500 Units, Intracatheter, Once PRN, Volanda Napoleon, MD .  heparin lock flush 100 unit/mL, 250 Units, Intracatheter, Once PRN, Volanda Napoleon, MD .  PACLitaxel-protein bound (ABRAXANE) chemo infusion 200 mg, 100 mg/m2 (Treatment Plan Recorded), Intravenous, Once, Ennever, Rudell Cobb, MD .  sodium chloride flush (NS) 0.9 % injection 10 mL, 10 mL, Intracatheter, PRN, Volanda Napoleon, MD  Allergies:  Allergies  Allergen Reactions  . Iodinated Diagnostic Agents Itching and Rash  . Ivp Dye [Iodinated Diagnostic Agents] Itching and Rash  . Morphine And Related Hives  . Penicillins Rash    Past Medical History, Surgical history, Social history, and Family History were reviewed and updated.  Review of Systems: Review of Systems  Constitutional: Negative.   HENT:  Negative.   Eyes: Negative.   Respiratory: Negative.   Cardiovascular: Negative.   Gastrointestinal: Positive for abdominal pain and nausea.  Endocrine: Negative.   Genitourinary: Negative.    Musculoskeletal: Negative.   Skin: Negative.   Neurological: Negative.   Hematological: Negative.   Psychiatric/Behavioral: Negative.     Physical Exam:  weight is 212 lb (96.2 kg). Her oral temperature is 98.7 F (37.1 C). Her blood pressure is 129/65 and her pulse is 102 (abnormal). Her respiration is  18 and oxygen saturation is 100%.   Wt Readings from Last 3 Encounters:  10/27/20 212 lb (96.2 kg)  09/22/20 208 lb 4 oz (94.5 kg)  07/21/20 217 lb 4 oz (98.5 kg)   Her vital signs show a temperature of 97.1.  Pulse is 100.  Blood pressure 116/70.  Weight is 230 pounds.  Physical Exam Vitals reviewed.  HENT:     Head: Normocephalic and atraumatic.  Eyes:     Pupils: Pupils are equal, round, and reactive to light.  Cardiovascular:     Rate and Rhythm: Normal rate and regular rhythm.     Heart sounds: Normal heart sounds.  Pulmonary:     Effort: Pulmonary effort is normal.     Breath  sounds: Normal breath sounds.  Abdominal:     General: Bowel sounds are normal.     Palpations: Abdomen is soft.  Musculoskeletal:        General: No tenderness or deformity. Normal range of motion.     Cervical back: Normal range of motion.  Lymphadenopathy:     Cervical: No cervical adenopathy.  Skin:    General: Skin is warm and dry.     Findings: No erythema or rash.  Neurological:     Mental Status: She is alert and oriented to person, place, and time.  Psychiatric:        Behavior: Behavior normal.        Thought Content: Thought content normal.        Judgment: Judgment normal.      Lab Results  Component Value Date   WBC 4.5 10/27/2020   HGB 11.2 (L) 10/27/2020   HCT 35.1 (L) 10/27/2020   MCV 84.6 10/27/2020   PLT 282 10/27/2020     Chemistry      Component Value Date/Time   NA 135 10/27/2020 0951   NA 136 01/07/2020 1256   K 4.3 10/27/2020 0951   CL 102 10/27/2020 0951   CO2 28 10/27/2020 0951   BUN 13 10/27/2020 0951   BUN 10 01/07/2020 1256   CREATININE 0.60 10/27/2020 0951      Component Value Date/Time   CALCIUM 9.4 10/27/2020 0951   ALKPHOS 46 10/27/2020 0951   AST 15 10/27/2020 0951   ALT 17 10/27/2020 0951   BILITOT 0.4 10/27/2020 0951       Impression and Plan: Angela Wilson is a very nice 55 year old woman from [place].  She has metastatic  pancreatic cancer.  By the tumor markers, she seems to be responding.  CT scan shows that everything is stable which is always a good thing.  Again, I am very happy that she was able to make it over to [place].  This was to see a grand child.  This was very important for her.   We will set her up with some CT scans.  We really need to see what is going on.  This is important.  Hopefully, her tumor markers are coming back down again.  She had a very nice response to treatment.  I will plan to get her back in another month.  We will get the CT scans in about 3 weeks.      Volanda Napoleon, MD 2/8/202212:24 PM

## 2020-10-28 LAB — CANCER ANTIGEN 19-9: CA 19-9: 3 U/mL (ref 0–35)

## 2020-10-28 LAB — CA 125: Cancer Antigen (CA) 125: 163 U/mL — ABNORMAL HIGH (ref 0.0–38.1)

## 2020-10-28 MED ORDER — DICYCLOMINE HCL 10 MG PO CAPS: 10.0000 mg | ORAL_CAPSULE | Freq: Three times a day (TID) | ORAL | 2 refills | Status: DC

## 2020-10-28 NOTE — Addendum Note (Signed)
Addended by: Burney Gauze R on: 10/28/2020 01:05 PM   Modules accepted: Orders

## 2020-10-30 ENCOUNTER — Encounter: Payer: Self-pay | Admitting: *Deleted

## 2020-11-02 MED FILL — fentaNYL 75 MCG/HR PT72: 75 | 30 days supply | Qty: 10 | Fill #0

## 2020-11-03 ENCOUNTER — Other Ambulatory Visit: Payer: Self-pay | Admitting: *Deleted

## 2020-11-03 DIAGNOSIS — C252 Malignant neoplasm of tail of pancreas: Secondary | ICD-10-CM

## 2020-11-03 DIAGNOSIS — C253 Malignant neoplasm of pancreatic duct: Secondary | ICD-10-CM

## 2020-11-04 ENCOUNTER — Ambulatory Visit: Payer: Self-pay

## 2020-11-04 ENCOUNTER — Other Ambulatory Visit: Payer: Self-pay

## 2020-11-04 ENCOUNTER — Inpatient Hospital Stay: Payer: 59

## 2020-11-04 ENCOUNTER — Encounter: Payer: Self-pay | Admitting: *Deleted

## 2020-11-04 VITALS — BP 133/70 | Temp 99.1°F | Resp 18

## 2020-11-04 DIAGNOSIS — C252 Malignant neoplasm of tail of pancreas: Secondary | ICD-10-CM

## 2020-11-04 DIAGNOSIS — C253 Malignant neoplasm of pancreatic duct: Secondary | ICD-10-CM

## 2020-11-04 DIAGNOSIS — Z5111 Encounter for antineoplastic chemotherapy: Secondary | ICD-10-CM | POA: Diagnosis not present

## 2020-11-04 LAB — CMP (CANCER CENTER ONLY)
ALT: 26 U/L (ref 0–44)
AST: 19 U/L (ref 15–41)
Albumin: 3.8 g/dL (ref 3.5–5.0)
Alkaline Phosphatase: 44 U/L (ref 38–126)
Anion gap: 7 (ref 5–15)
BUN: 14 mg/dL (ref 6–20)
CO2: 26 mmol/L (ref 22–32)
Calcium: 9.5 mg/dL (ref 8.9–10.3)
Chloride: 102 mmol/L (ref 98–111)
Creatinine: 0.56 mg/dL (ref 0.44–1.00)
GFR, Estimated: >60 mL/min (ref >60)
Glucose, Bld: 163 mg/dL — ABNORMAL HIGH (ref 70–99)
Potassium: 4.4 mmol/L (ref 3.5–5.1)
Sodium: 135 mmol/L (ref 135–145)
Total Bilirubin: 0.3 mg/dL (ref 0.3–1.2)
Total Protein: 7.2 g/dL (ref 6.5–8.1)

## 2020-11-04 LAB — CBC WITH DIFFERENTIAL (CANCER CENTER ONLY)
Abs Immature Granulocytes: 0.31 10*3/uL — ABNORMAL HIGH (ref 0.00–0.07)
Basophils Absolute: 0.1 10*3/uL (ref 0.0–0.1)
Basophils Relative: 1 %
Eosinophils Absolute: 0.1 10*3/uL (ref 0.0–0.5)
Eosinophils Relative: 2 %
HCT: 33.8 % — ABNORMAL LOW (ref 36.0–46.0)
Hemoglobin: 11 g/dL — ABNORMAL LOW (ref 12.0–15.0)
Immature Granulocytes: 5 %
Lymphocytes Relative: 25 %
Lymphs Abs: 1.7 10*3/uL (ref 0.7–4.0)
MCH: 27.5 pg (ref 26.0–34.0)
MCHC: 32.5 g/dL (ref 30.0–36.0)
MCV: 84.5 fL (ref 80.0–100.0)
Monocytes Absolute: 0.6 10*3/uL (ref 0.1–1.0)
Monocytes Relative: 9 %
Neutro Abs: 4.1 10*3/uL (ref 1.7–7.7)
Neutrophils Relative %: 58 %
Platelet Count: 282 10*3/uL (ref 150–400)
RBC: 4 MIL/uL (ref 3.87–5.11)
RDW: 14.8 % (ref 11.5–15.5)
WBC Count: 6.9 10*3/uL (ref 4.0–10.5)
nRBC: 0.3 % — ABNORMAL HIGH (ref 0.0–0.2)

## 2020-11-04 MED ORDER — HEPARIN SOD (PORK) LOCK FLUSH 100 UNIT/ML IV SOLN
500.0000 [IU] | Freq: Once | INTRAVENOUS | Status: AC | PRN
  Administered 2020-11-04: 500 [IU]
  Filled 2020-11-04: qty 5

## 2020-11-04 MED ORDER — HYDROMORPHONE HCL 1 MG/ML IJ SOLN
1.0000 mg | Freq: Once | INTRAMUSCULAR | Status: AC
  Administered 2020-11-04: 1 mg via INTRAVENOUS

## 2020-11-04 MED ORDER — SODIUM CHLORIDE 0.9 % IV SOLN
Freq: Once | INTRAVENOUS | Status: AC
  Filled 2020-11-04: qty 250

## 2020-11-04 MED ORDER — PALONOSETRON HCL INJECTION 0.25 MG/5ML
0.2500 mg | Freq: Once | INTRAVENOUS | Status: AC
  Administered 2020-11-04: 0.25 mg via INTRAVENOUS

## 2020-11-04 MED ORDER — HYDROMORPHONE HCL 1 MG/ML IJ SOLN
INTRAMUSCULAR | Status: AC
  Filled 2020-11-04: qty 1

## 2020-11-04 MED ORDER — PACLITAXEL PROTEIN-BOUND CHEMO INJECTION 100 MG
100.0000 mg/m2 | Freq: Once | INTRAVENOUS | Status: AC
  Administered 2020-11-04: 200 mg via INTRAVENOUS
  Filled 2020-11-04: qty 40

## 2020-11-04 MED ORDER — PALONOSETRON HCL INJECTION 0.25 MG/5ML
INTRAVENOUS | Status: AC
  Filled 2020-11-04: qty 5

## 2020-11-04 MED ORDER — SODIUM CHLORIDE 0.9% FLUSH
3.0000 mL | INTRAVENOUS | Status: DC | PRN
  Filled 2020-11-04: qty 10

## 2020-11-04 MED ORDER — SODIUM CHLORIDE 0.9 % IV SOLN
2000.0000 mg | Freq: Once | INTRAVENOUS | Status: AC
  Administered 2020-11-04: 2000 mg via INTRAVENOUS
  Filled 2020-11-04: qty 52.6

## 2020-11-04 MED ORDER — SODIUM CHLORIDE 0.9% FLUSH
10.0000 mL | INTRAVENOUS | Status: DC | PRN
  Administered 2020-11-04 (×2): 10 mL
  Filled 2020-11-04: qty 10

## 2020-11-04 MED ORDER — SODIUM CHLORIDE 0.9 % IV SOLN
10.0000 mg | Freq: Once | INTRAVENOUS | Status: AC
  Administered 2020-11-04: 10 mg via INTRAVENOUS
  Filled 2020-11-04: qty 10

## 2020-11-04 NOTE — Progress Notes (Signed)
See MyChart communication  Oncology Nurse Navigator Documentation  Oncology Nurse Navigator Flowsheets 11/04/2020  Confirmed Diagnosis Date -  Diagnosis Status -  Planned Course of Treatment -  Phase of Treatment -  Chemotherapy Pending- Reason: -  Chemotherapy Actual Start Date: -  Navigator Follow Up Date: 11/16/2020  Navigator Follow Up Reason: Scan Review  Navigator Location [facility]  Referral Date to RadOnc/MedOnc -  Navigator Encounter Type MyChart;Treatment  Telephone -  Treatment Initiated Date -  Patient Visit Type MedOnc  Treatment Phase Active Tx  Barriers/Navigation Needs Coordination of Care;Cultural Needs;Family Concerns;Language/Communication;Education  Education Occupational psychologist Treatment Options  Interventions -  Acuity Level 2-Minimal Needs (1-2 Barriers Identified)  Referrals -  Coordination of Care -  Education Method Verbal  Support Groups/Services Friends and Family  Time Spent with Patient 79

## 2020-11-04 NOTE — Progress Notes (Signed)
Patient states she feels better after receiving pain medication earlier

## 2020-11-04 NOTE — Patient Instructions (Addendum)
Implanted Port Home Guide An implanted port is a device that is placed under the skin. It is usually placed in the chest. The device can be used to give IV medicine, to take blood, or for dialysis. You may have an implanted port if:  You need IV medicine that would be irritating to the small veins in your hands or arms.  You need IV medicines, such as antibiotics, for a long period of time.  You need IV nutrition for a long period of time.  You need dialysis. When you have a port, your health care provider can choose to use the port instead of veins in your arms for these procedures. You may have fewer limitations when using a port than you would if you used other types of long-term IVs, and you will likely be able to return to normal activities after your incision heals. An implanted port has two main parts:  Reservoir. The reservoir is the part where a needle is inserted to give medicines or draw blood. The reservoir is round. After it is placed, it appears as a small, raised area under your skin.  Catheter. The catheter is a thin, flexible tube that connects the reservoir to a vein. Medicine that is inserted into the reservoir goes into the catheter and then into the vein. How is my port accessed? To access your port:  A numbing cream may be placed on the skin over the port site.  Your health care provider will put on a mask and sterile gloves.  The skin over your port will be cleaned carefully with a germ-killing soap and allowed to dry.  Your health care provider will gently pinch the port and insert a needle into it.  Your health care provider will check for a blood return to make sure the port is in the vein and is not clogged.  If your port needs to remain accessed to get medicine continuously (constant infusion), your health care provider will place a clear bandage (dressing) over the needle site. The dressing and needle will need to be changed every week, or as told by your  health care provider. What is flushing? Flushing helps keep the port from getting clogged. Follow instructions from your health care provider about how and when to flush the port. Ports are usually flushed with saline solution or a medicine called heparin. The need for flushing will depend on how the port is used:  If the port is only used from time to time to give medicines or draw blood, the port may need to be flushed: ? Before and after medicines have been given. ? Before and after blood has been drawn. ? As part of routine maintenance. Flushing may be recommended every 4-6 weeks.  If a constant infusion is running, the port may not need to be flushed.  Throw away any syringes in a disposal container that is meant for sharp items (sharps container). You can buy a sharps container from a pharmacy, or you can make one by using an empty hard plastic bottle with a cover. How long will my port stay implanted? The port can stay in for as long as your health care provider thinks it is needed. When it is time for the port to come out, a surgery will be done to remove it. The surgery will be similar to the procedure that was done to put the port in. Follow these instructions at home:  Flush your port as told by your health care   provider.  If you need an infusion over several days, follow instructions from your health care provider about how to take care of your port site. Make sure you: ? Wash your hands with soap and water before you change your dressing. If soap and water are not available, use alcohol-based hand sanitizer. ? Change your dressing as told by your health care provider. ? Place any used dressings or infusion bags into a plastic bag. Throw that bag in the trash. ? Keep the dressing that covers the needle clean and dry. Do not get it wet. ? Do not use scissors or sharp objects near the tube. ? Keep the tube clamped, unless it is being used.  Check your port site every day for signs  of infection. Check for: ? Redness, swelling, or pain. ? Fluid or blood. ? Pus or a bad smell.  Protect the skin around the port site. ? Avoid wearing bra straps that rub or irritate the site. ? Protect the skin around your port from seat belts. Place a soft pad over your chest if needed.  Bathe or shower as told by your health care provider. The site may get wet as long as you are not actively receiving an infusion.  Return to your normal activities as told by your health care provider. Ask your health care provider what activities are safe for you.  Carry a medical alert card or wear a medical alert bracelet at all times. This will let health care providers know that you have an implanted port in case of an emergency.   Get help right away if:  You have redness, swelling, or pain at the port site.  You have fluid or blood coming from your port site.  You have pus or a bad smell coming from the port site.  You have a fever. Summary  Implanted ports are usually placed in the chest for long-term IV access.  Follow instructions from your health care provider about flushing the port and changing bandages (dressings).  Take care of the area around your port by avoiding clothing that puts pressure on the area, and by watching for signs of infection.  Protect the skin around your port from seat belts. Place a soft pad over your chest if needed.  Get help right away if you have a fever or you have redness, swelling, pain, drainage, or a bad smell at the port site. This information is not intended to replace advice given to you by your health care provider. Make sure you discuss any questions you have with your health care provider. Document Revised: 01/20/2020 Document Reviewed: 01/20/2020 Elsevier Patient Education  [place] Albumin-Bound Paclitaxel injection What is this medicine? NANOPARTICLE ALBUMIN-BOUND PACLITAXEL (Na no PAHR ti kuhl al BYOO muhn-bound  PAK li TAX el) is a chemotherapy drug. It targets fast dividing cells, like cancer cells, and causes these cells to die. This medicine is used to treat advanced breast cancer, lung cancer, and pancreatic cancer. This medicine may be used for other purposes; ask your health care provider or pharmacist if you have questions. COMMON BRAND NAME(S): Abraxane What should I tell my health care provider before I take this medicine? They need to know if you have any of these conditions:  kidney disease  liver disease  low blood counts, like low white cell, platelet, or red cell counts  lung or breathing disease, like asthma  tingling of the fingers or toes, or other nerve disorder  an unusual  or allergic reaction to paclitaxel, albumin, other chemotherapy, other medicines, foods, dyes, or preservatives  pregnant or trying to get pregnant  breast-feeding How should I use this medicine? This drug is given as an infusion into a vein. It is administered in a hospital or clinic by a specially trained health care professional. Talk to your pediatrician regarding the use of this medicine in children. Special care may be needed. Overdosage: If you think you have taken too much of this medicine contact a poison control center or emergency room at once. NOTE: This medicine is only for you. Do not share this medicine with others. What if I miss a dose? It is important not to miss your dose. Call your doctor or health care professional if you are unable to keep an appointment. What may interact with this medicine? This medicine may interact with the following medications:  antiviral medicines for hepatitis, HIV or AIDS  certain antibiotics like erythromycin and clarithromycin  certain medicines for fungal infections like ketoconazole and itraconazole  certain medicines for seizures like carbamazepine, phenobarbital, phenytoin  gemfibrozil  nefazodone  rifampin  St. John's wort This list may  not describe all possible interactions. Give your health care provider a list of all the medicines, herbs, non-prescription drugs, or dietary supplements you use. Also tell them if you smoke, drink alcohol, or use illegal drugs. Some items may interact with your medicine. What should I watch for while using this medicine? Your condition will be monitored carefully while you are receiving this medicine. You will need important blood work done while you are taking this medicine. This medicine can cause serious allergic reactions. If you experience allergic reactions like skin rash, itching or hives, swelling of the face, lips, or tongue, tell your doctor or health care professional right away. In some cases, you may be given additional medicines to help with side effects. Follow all directions for their use. This drug may make you feel generally unwell. This is not uncommon, as chemotherapy can affect healthy cells as well as cancer cells. Report any side effects. Continue your course of treatment even though you feel ill unless your doctor tells you to stop. Call your doctor or health care professional for advice if you get a fever, chills or sore throat, or other symptoms of a cold or flu. Do not treat yourself. This drug decreases your body's ability to fight infections. Try to avoid being around people who are sick. This medicine may increase your risk to bruise or bleed. Call your doctor or health care professional if you notice any unusual bleeding. Be careful brushing and flossing your teeth or using a toothpick because you may get an infection or bleed more easily. If you have any dental work done, tell your dentist you are receiving this medicine. Avoid taking products that contain aspirin, acetaminophen, ibuprofen, naproxen, or ketoprofen unless instructed by your doctor. These medicines may hide a fever. Do not become pregnant while taking this medicine or for 6 months after stopping it. Women  should inform their doctor if they wish to become pregnant or think they might be pregnant. Men should not father a child while taking this medicine or for 3 months after stopping it. There is a potential for serious side effects to an unborn child. Talk to your health care professional or pharmacist for more information. Do not breast-feed an infant while taking this medicine or for 2 weeks after stopping it. This medicine may interfere with the ability to get  pregnant or to father a child. You should talk to your doctor or health care professional if you are concerned about your fertility. What side effects may I notice from receiving this medicine? Side effects that you should report to your doctor or health care professional as soon as possible:  allergic reactions like skin rash, itching or hives, swelling of the face, lips, or tongue  breathing problems  changes in vision  fast, irregular heartbeat  low blood pressure  mouth sores  pain, tingling, numbness in the hands or feet  signs of decreased platelets or bleeding - bruising, pinpoint red spots on the skin, black, tarry stools, blood in the urine  signs of decreased red blood cells - unusually weak or tired, feeling faint or lightheaded, falls  signs of infection - fever or chills, cough, sore throat, pain or difficulty passing urine  signs and symptoms of liver injury like dark yellow or brown urine; general ill feeling or flu-like symptoms; light-colored stools; loss of appetite; nausea; right upper belly pain; unusually weak or tired; yellowing of the eyes or skin  swelling of the ankles, feet, hands  unusually slow heartbeat Side effects that usually do not require medical attention (report to your doctor or health care professional if they continue or are bothersome):  diarrhea  hair loss  loss of appetite  nausea, vomiting  tiredness This list may not describe all possible side effects. Call your doctor for  medical advice about side effects. You may report side effects to FDA at 1-800-FDA-1088. Where should I keep my medicine? This drug is given in a hospital or clinic and will not be stored at home. NOTE: This sheet is a summary. It may not cover all possible information. If you have questions about this medicine, talk to your doctor, pharmacist, or health care provider.  2021 Elsevier/Gold Standard (2017-05-09 13:03:45) Gemcitabine injection What is this medicine? GEMCITABINE (jem SYE ta been) is a chemotherapy drug. This medicine is used to treat many types of cancer like breast cancer, lung cancer, pancreatic cancer, and ovarian cancer. This medicine may be used for other purposes; ask your health care provider or pharmacist if you have questions. COMMON BRAND NAME(S): Gemzar, Infugem What should I tell my health care provider before I take this medicine? They need to know if you have any of these conditions:  blood disorders  infection  kidney disease  liver disease  lung or breathing disease, like asthma  recent or ongoing radiation therapy  an unusual or allergic reaction to gemcitabine, other chemotherapy, other medicines, foods, dyes, or preservatives  pregnant or trying to get pregnant  breast-feeding How should I use this medicine? This drug is given as an infusion into a vein. It is administered in a hospital or clinic by a specially trained health care professional. Talk to your pediatrician regarding the use of this medicine in children. Special care may be needed. Overdosage: If you think you have taken too much of this medicine contact a poison control center or emergency room at once. NOTE: This medicine is only for you. Do not share this medicine with others. What if I miss a dose? It is important not to miss your dose. Call your doctor or health care professional if you are unable to keep an appointment. What may interact with this medicine?  medicines to  increase blood counts like filgrastim, pegfilgrastim, sargramostim  some other chemotherapy drugs like cisplatin  vaccines Talk to your doctor or health care professional before  taking any of these medicines:  acetaminophen  aspirin  ibuprofen  ketoprofen  naproxen This list may not describe all possible interactions. Give your health care provider a list of all the medicines, herbs, non-prescription drugs, or dietary supplements you use. Also tell them if you smoke, drink alcohol, or use illegal drugs. Some items may interact with your medicine. What should I watch for while using this medicine? Visit your doctor for checks on your progress. This drug may make you feel generally unwell. This is not uncommon, as chemotherapy can affect healthy cells as well as cancer cells. Report any side effects. Continue your course of treatment even though you feel ill unless your doctor tells you to stop. In some cases, you may be given additional medicines to help with side effects. Follow all directions for their use. Call your doctor or health care professional for advice if you get a fever, chills or sore throat, or other symptoms of a cold or flu. Do not treat yourself. This drug decreases your body's ability to fight infections. Try to avoid being around people who are sick. This medicine may increase your risk to bruise or bleed. Call your doctor or health care professional if you notice any unusual bleeding. Be careful brushing and flossing your teeth or using a toothpick because you may get an infection or bleed more easily. If you have any dental work done, tell your dentist you are receiving this medicine. Avoid taking products that contain aspirin, acetaminophen, ibuprofen, naproxen, or ketoprofen unless instructed by your doctor. These medicines may hide a fever. Do not become pregnant while taking this medicine or for 6 months after stopping it. Women should inform their doctor if they wish  to become pregnant or think they might be pregnant. Men should not father a child while taking this medicine and for 3 months after stopping it. There is a potential for serious side effects to an unborn child. Talk to your health care professional or pharmacist for more information. Do not breast-feed an infant while taking this medicine or for at least 1 week after stopping it. Men should inform their doctors if they wish to father a child. This medicine may lower sperm counts. Talk with your doctor or health care professional if you are concerned about your fertility. What side effects may I notice from receiving this medicine? Side effects that you should report to your doctor or health care professional as soon as possible:  allergic reactions like skin rash, itching or hives, swelling of the face, lips, or tongue  breathing problems  pain, redness, or irritation at site where injected  signs and symptoms of a dangerous change in heartbeat or heart rhythm like chest pain; dizziness; fast or irregular heartbeat; palpitations; feeling faint or lightheaded, falls; breathing problems  signs of decreased platelets or bleeding - bruising, pinpoint red spots on the skin, black, tarry stools, blood in the urine  signs of decreased red blood cells - unusually weak or tired, feeling faint or lightheaded, falls  signs of infection - fever or chills, cough, sore throat, pain or difficulty passing urine  signs and symptoms of kidney injury like trouble passing urine or change in the amount of urine  signs and symptoms of liver injury like dark yellow or brown urine; general ill feeling or flu-like symptoms; light-colored stools; loss of appetite; nausea; right upper belly pain; unusually weak or tired; yellowing of the eyes or skin  swelling of ankles, feet, hands Side effects that  usually do not require medical attention (report to your doctor or health care professional if they continue or are  bothersome):  constipation  diarrhea  hair loss  loss of appetite  nausea  rash  vomiting This list may not describe all possible side effects. Call your doctor for medical advice about side effects. You may report side effects to FDA at 1-800-FDA-1088. Where should I keep my medicine? This drug is given in a hospital or clinic and will not be stored at home. NOTE: This sheet is a summary. It may not cover all possible information. If you have questions about this medicine, talk to your doctor, pharmacist, or health care provider.  2021 Elsevier/Gold Standard (2017-11-29 18:06:11)

## 2020-11-10 ENCOUNTER — Inpatient Hospital Stay: Payer: 59

## 2020-11-10 ENCOUNTER — Other Ambulatory Visit: Payer: Self-pay | Admitting: *Deleted

## 2020-11-10 ENCOUNTER — Other Ambulatory Visit: Payer: Self-pay

## 2020-11-10 VITALS — BP 116/58 | HR 94 | Temp 98.6°F | Resp 18

## 2020-11-10 DIAGNOSIS — Z95828 Presence of other vascular implants and grafts: Secondary | ICD-10-CM

## 2020-11-10 DIAGNOSIS — C252 Malignant neoplasm of tail of pancreas: Secondary | ICD-10-CM

## 2020-11-10 DIAGNOSIS — Z5111 Encounter for antineoplastic chemotherapy: Secondary | ICD-10-CM | POA: Diagnosis not present

## 2020-11-10 DIAGNOSIS — C253 Malignant neoplasm of pancreatic duct: Secondary | ICD-10-CM

## 2020-11-10 LAB — CBC WITH DIFFERENTIAL (CANCER CENTER ONLY)
Abs Immature Granulocytes: 0.03 10*3/uL (ref 0.00–0.07)
Basophils Absolute: 0 10*3/uL (ref 0.0–0.1)
Basophils Relative: 1 %
Eosinophils Absolute: 0 10*3/uL (ref 0.0–0.5)
Eosinophils Relative: 1 %
HCT: 31.2 % — ABNORMAL LOW (ref 36.0–46.0)
Hemoglobin: 10.1 g/dL — ABNORMAL LOW (ref 12.0–15.0)
Immature Granulocytes: 1 %
Lymphocytes Relative: 34 %
Lymphs Abs: 1.3 10*3/uL (ref 0.7–4.0)
MCH: 27.4 pg (ref 26.0–34.0)
MCHC: 32.4 g/dL (ref 30.0–36.0)
MCV: 84.6 fL (ref 80.0–100.0)
Monocytes Absolute: 0.3 10*3/uL (ref 0.1–1.0)
Monocytes Relative: 6 %
Neutro Abs: 2.2 10*3/uL (ref 1.7–7.7)
Neutrophils Relative %: 57 %
Platelet Count: 158 10*3/uL (ref 150–400)
RBC: 3.69 MIL/uL — ABNORMAL LOW (ref 3.87–5.11)
RDW: 14.9 % (ref 11.5–15.5)
WBC Count: 3.9 10*3/uL — ABNORMAL LOW (ref 4.0–10.5)
nRBC: 0 % (ref 0.0–0.2)

## 2020-11-10 LAB — CMP (CANCER CENTER ONLY)
ALT: 29 U/L (ref 0–44)
AST: 20 U/L (ref 15–41)
Albumin: 3.6 g/dL (ref 3.5–5.0)
Alkaline Phosphatase: 45 U/L (ref 38–126)
Anion gap: 5 (ref 5–15)
BUN: 18 mg/dL (ref 6–20)
CO2: 28 mmol/L (ref 22–32)
Calcium: 9.3 mg/dL (ref 8.9–10.3)
Chloride: 101 mmol/L (ref 98–111)
Creatinine: 0.6 mg/dL (ref 0.44–1.00)
GFR, Estimated: >60 mL/min (ref >60)
Glucose, Bld: 176 mg/dL — ABNORMAL HIGH (ref 70–99)
Potassium: 4.4 mmol/L (ref 3.5–5.1)
Sodium: 134 mmol/L — ABNORMAL LOW (ref 135–145)
Total Bilirubin: 0.3 mg/dL (ref 0.3–1.2)
Total Protein: 6.8 g/dL (ref 6.5–8.1)

## 2020-11-10 MED ORDER — SODIUM CHLORIDE 0.9 % IV SOLN
10.0000 mg | Freq: Once | INTRAVENOUS | Status: AC
  Administered 2020-11-10: 10 mg via INTRAVENOUS
  Filled 2020-11-10: qty 10

## 2020-11-10 MED ORDER — PALONOSETRON HCL INJECTION 0.25 MG/5ML
0.2500 mg | Freq: Once | INTRAVENOUS | Status: AC
  Administered 2020-11-10: 0.25 mg via INTRAVENOUS

## 2020-11-10 MED ORDER — SODIUM CHLORIDE 0.9 % IV SOLN
2000.0000 mg | Freq: Once | INTRAVENOUS | Status: AC
  Administered 2020-11-10: 2000 mg via INTRAVENOUS
  Filled 2020-11-10: qty 52.6

## 2020-11-10 MED ORDER — SODIUM CHLORIDE 0.9% FLUSH
10.0000 mL | INTRAVENOUS | Status: DC | PRN
  Administered 2020-11-10: 10 mL
  Filled 2020-11-10: qty 10

## 2020-11-10 MED ORDER — PALONOSETRON HCL INJECTION 0.25 MG/5ML
INTRAVENOUS | Status: AC
  Filled 2020-11-10: qty 5

## 2020-11-10 MED ORDER — SODIUM CHLORIDE 0.9% FLUSH
10.0000 mL | Freq: Once | INTRAVENOUS | Status: DC
  Filled 2020-11-10: qty 10

## 2020-11-10 MED ORDER — SODIUM CHLORIDE 0.9 % IV SOLN
Freq: Once | INTRAVENOUS | Status: AC
  Filled 2020-11-10: qty 250

## 2020-11-10 MED ORDER — HEPARIN SOD (PORK) LOCK FLUSH 100 UNIT/ML IV SOLN
500.0000 [IU] | Freq: Once | INTRAVENOUS | Status: AC | PRN
  Administered 2020-11-10: 500 [IU]
  Filled 2020-11-10: qty 5

## 2020-11-10 MED ORDER — PACLITAXEL PROTEIN-BOUND CHEMO INJECTION 100 MG
100.0000 mg/m2 | Freq: Once | INTRAVENOUS | Status: AC
  Administered 2020-11-10: 200 mg via INTRAVENOUS
  Filled 2020-11-10: qty 40

## 2020-11-10 MED FILL — fentaNYL 75 MCG/HR PT72: 75 | 30 days supply | Qty: 10 | Fill #0

## 2020-11-10 NOTE — Patient Instructions (Signed)
[place] Discharge Instructions for Patients Receiving Chemotherapy  Today you received the following chemotherapy agents Paclitaxel, Gemzar.  To help prevent nausea and vomiting after your treatment, we encourage you to take your nausea medication as indicated by your MD>   If you develop nausea and vomiting that is not controlled by your nausea medication, call the clinic.   BELOW ARE SYMPTOMS THAT SHOULD BE REPORTED IMMEDIATELY:  *FEVER GREATER THAN 100.5 F  *CHILLS WITH OR WITHOUT FEVER  NAUSEA AND VOMITING THAT IS NOT CONTROLLED WITH YOUR NAUSEA MEDICATION  *UNUSUAL SHORTNESS OF BREATH  *UNUSUAL BRUISING OR BLEEDING  TENDERNESS IN MOUTH AND THROAT WITH OR WITHOUT PRESENCE OF ULCERS  *URINARY PROBLEMS  *BOWEL PROBLEMS  UNUSUAL RASH Items with * indicate a potential emergency and should be followed up as soon as possible.  Feel free to call the clinic should you have any questions or concerns. The clinic phone number is [phone].  Please show the [place] at check-in to the Emergency Department and triage nurse.

## 2020-11-10 NOTE — Patient Instructions (Signed)
Implanted Port Insertion, Care After This sheet gives you information about how to care for yourself after your procedure. Your health care provider may also give you more specific instructions. If you have problems or questions, contact your health care provider. What can I expect after the procedure? After the procedure, it is common to have:  Discomfort at the port insertion site.  Bruising on the skin over the port. This should improve over 3-4 days. Follow these instructions at home: Port care  After your port is placed, you will get a manufacturer's information card. The card has information about your port. Keep this card with you at all times.  Take care of the port as told by your health care provider. Ask your health care provider if you or a family member can get training for taking care of the port at home. A home health care nurse may also take care of the port.  Make sure to remember what type of port you have. Incision care  Follow instructions from your health care provider about how to take care of your port insertion site. Make sure you: ? Wash your hands with soap and water before and after you change your bandage (dressing). If soap and water are not available, use hand sanitizer. ? Change your dressing as told by your health care provider. ? Leave stitches (sutures), skin glue, or adhesive strips in place. These skin closures may need to stay in place for 2 weeks or longer. If adhesive strip edges start to loosen and curl up, you may trim the loose edges. Do not remove adhesive strips completely unless your health care provider tells you to do that.  Check your port insertion site every day for signs of infection. Check for: ? Redness, swelling, or pain. ? Fluid or blood. ? Warmth. ? Pus or a bad smell.      Activity  Return to your normal activities as told by your health care provider. Ask your health care provider what activities are safe for you.  Do not  lift anything that is heavier than 10 lb (4.5 kg), or the limit that you are told, until your health care provider says that it is safe. General instructions  Take over-the-counter and prescription medicines only as told by your health care provider.  Do not take baths, swim, or use a hot tub until your health care provider approves. Ask your health care provider if you may take showers. You may only be allowed to take sponge baths.  Do not drive for 24 hours if you were given a sedative during your procedure.  Wear a medical alert bracelet in case of an emergency. This will tell any health care providers that you have a port.  Keep all follow-up visits as told by your health care provider. This is important. Contact a health care provider if:  You cannot flush your port with saline as directed, or you cannot draw blood from the port.  You have a fever or chills.  You have redness, swelling, or pain around your port insertion site.  You have fluid or blood coming from your port insertion site.  Your port insertion site feels warm to the touch.  You have pus or a bad smell coming from the port insertion site. Get help right away if:  You have chest pain or shortness of breath.  You have bleeding from your port that you cannot control. Summary  Take care of the port as told by your   health care provider. Keep the manufacturer's information card with you at all times.  Change your dressing as told by your health care provider.  Contact a health care provider if you have a fever or chills or if you have redness, swelling, or pain around your port insertion site.  Keep all follow-up visits as told by your health care provider. This information is not intended to replace advice given to you by your health care provider. Make sure you discuss any questions you have with your health care provider. Document Revised: 04/03/2018 Document Reviewed: 04/03/2018 Elsevier Patient Education   2021 Elsevier Inc.  

## 2020-11-13 ENCOUNTER — Ambulatory Visit (HOSPITAL_BASED_OUTPATIENT_CLINIC_OR_DEPARTMENT_OTHER): Payer: 59

## 2020-11-16 ENCOUNTER — Ambulatory Visit (HOSPITAL_BASED_OUTPATIENT_CLINIC_OR_DEPARTMENT_OTHER)
Admission: RE | Admit: 2020-11-16 | Discharge: 2020-11-16 | Disposition: A | Discharge status: Home / Self Care | Payer: 59 | Source: Ambulatory Visit | Attending: Hematology & Oncology | Admitting: Hematology & Oncology

## 2020-11-16 ENCOUNTER — Other Ambulatory Visit: Payer: Self-pay

## 2020-11-16 DIAGNOSIS — C253 Malignant neoplasm of pancreatic duct: Secondary | ICD-10-CM | POA: Insufficient documentation

## 2020-11-17 ENCOUNTER — Encounter: Payer: Self-pay | Admitting: *Deleted

## 2020-11-17 ENCOUNTER — Encounter: Payer: Self-pay | Admitting: Hematology & Oncology

## 2020-11-17 NOTE — Progress Notes (Signed)
Oncology Nurse Navigator Documentation  Oncology Nurse Navigator Flowsheets 11/17/2020  Confirmed Diagnosis Date -  Diagnosis Status -  Planned Course of Treatment -  Phase of Treatment -  Chemotherapy Pending- Reason: -  Chemotherapy Actual Start Date: -  Navigator Follow Up Date: 11/25/2020  Navigator Follow Up Reason: Follow-up Appointment;Chemotherapy  Navigator Location [facility]  Referral Date to RadOnc/MedOnc -  Navigator Encounter Type Scan Review  Telephone -  Treatment Initiated Date -  Patient Visit Type MedOnc  Treatment Phase Active Tx  Barriers/Navigation Needs Coordination of Care;Cultural Needs;Family Concerns;Language/Communication;Education  Education -  Interventions None Required  Acuity Level 2-Minimal Needs (1-2 Barriers Identified)  Referrals -  Coordination of Care -  Education Method -  Support Groups/Services Friends and Family  Time Spent with Patient 15

## 2020-11-18 ENCOUNTER — Encounter: Payer: Self-pay | Admitting: *Deleted

## 2020-11-18 NOTE — Progress Notes (Signed)
See MyChart messaging  Oncology Nurse Navigator Documentation  Oncology Nurse Navigator Flowsheets 11/18/2020  Confirmed Diagnosis Date -  Diagnosis Status -  Planned Course of Treatment -  Phase of Treatment -  Chemotherapy Pending- Reason: -  Chemotherapy Actual Start Date: -  Navigator Follow Up Date: 11/25/2020  Navigator Follow Up Reason: Follow-up Appointment;Chemotherapy  Navigator Location [facility]  Referral Date to RadOnc/MedOnc -  Navigator Encounter Type MyChart  Telephone -  Treatment Initiated Date -  Patient Visit Type MedOnc  Treatment Phase Active Tx  Barriers/Navigation Needs Coordination of Care;Cultural Needs;Family Concerns;Language/Communication;Education  Education Other  Interventions Coordination of Care;Education;Psycho-Social Support  Acuity Level 2-Minimal Needs (1-2 Barriers Identified)  Referrals -  Coordination of Care Other  Education Method Written  Support Groups/Services Friends and Family  Time Spent with Patient 38

## 2020-11-19 ENCOUNTER — Other Ambulatory Visit: Payer: Self-pay | Admitting: Hematology & Oncology

## 2020-11-19 NOTE — Progress Notes (Signed)
DISCONTINUE ON PATHWAY REGIMEN - Pancreatic Adenocarcinoma     A cycle is every 28 days:     Nab-paclitaxel (protein bound)      Gemcitabine   **Always confirm dose/schedule in your pharmacy ordering system**  REASON: Disease Progression PRIOR TREATMENT: PANOS51: Nab-Paclitaxel (Abraxane) 125 mg/m2 D1, 8, 15 + Gemcitabine 1,000 mg/m2 D1, 8, 15 q28 Days Until Progression or Toxicity TREATMENT RESPONSE: Partial Response (PR)  START OFF PATHWAY REGIMEN - Pancreatic Adenocarcinoma   [id number]:mFOLFIRINOX (Leucovorin IV D1 + Fluorouracil CIV D1,2 + Irinotecan IV D1 + Oxaliplatin IV D1) q14 Days:   A cycle is every 14 days:     Oxaliplatin      Leucovorin      Irinotecan      Fluorouracil   **Always confirm dose/schedule in your pharmacy ordering system**  Patient Characteristics: Metastatic Disease, Second Line, MSS/pMMR or MSI Unknown, Gemcitabine-Based Therapy First Line Therapeutic Status: Metastatic Disease Line of Therapy: Second Line Microsatellite/Mismatch Repair Status: MSS/pMMR Intent of Therapy: Non-Curative / Palliative Intent, Discussed with Patient

## 2020-11-20 ENCOUNTER — Encounter: Payer: Self-pay | Admitting: *Deleted

## 2020-11-20 NOTE — Progress Notes (Signed)
Dr Marin Olp spoke to patient's daughter, Rehab, yesterday evening and discussed starting a new treatment regimen next week. Patient's daughter would like this started ASAP, Monday if possible. Our insurance specialist has submitted for authorization, however in the past the insurance has taken multiple days to approve plans.   I called Rehab and spoke to her this morning reviewing the procedure and that we have to wait for insurance approval. Once this is obtained we will get patient scheduled to come in for treatment. She understands. She had several questions about new treatment regimen which I answered to her satisfaction.   Oncology Nurse Navigator Documentation  Oncology Nurse Navigator Flowsheets 11/20/2020  Confirmed Diagnosis Date -  Diagnosis Status -  Planned Course of Treatment -  Phase of Treatment -  Chemotherapy Pending- Reason: -  Chemotherapy Actual Start Date: -  Navigator Follow Up Date: 11/24/2020  Navigator Follow Up Reason: Appointment Review  Navigator Location [facility]  Referral Date to RadOnc/MedOnc -  Navigator Encounter Type Telephone  Telephone Outgoing Call  Treatment Initiated Date -  Patient Visit Type MedOnc  Treatment Phase Active Tx  Barriers/Navigation Needs Coordination of Care;Cultural Needs;Family Concerns;Language/Communication;Education  Education Other  Interventions Education;Psycho-Social Support  Acuity Level 3-Moderate Needs (3-4 Barriers Identified)  Referrals -  Coordination of Care -  Education Method Verbal  Support Groups/Services Friends and Family  Time Spent with Patient 60

## 2020-11-22 ENCOUNTER — Encounter: Payer: Self-pay | Admitting: *Deleted

## 2020-11-24 ENCOUNTER — Other Ambulatory Visit: Payer: Self-pay | Admitting: *Deleted

## 2020-11-24 DIAGNOSIS — C252 Malignant neoplasm of tail of pancreas: Secondary | ICD-10-CM

## 2020-11-24 MED ORDER — LOPERAMIDE HCL 2 MG PO TABS: ORAL_TABLET | ORAL | 1 refills | Status: DC

## 2020-11-25 ENCOUNTER — Inpatient Hospital Stay: Payer: 59

## 2020-11-25 ENCOUNTER — Encounter: Payer: Self-pay | Admitting: Hematology & Oncology

## 2020-11-25 ENCOUNTER — Other Ambulatory Visit: Payer: Self-pay | Admitting: Hematology & Oncology

## 2020-11-25 ENCOUNTER — Telehealth: Payer: Self-pay

## 2020-11-25 ENCOUNTER — Inpatient Hospital Stay: Payer: 59 | Attending: Hematology & Oncology

## 2020-11-25 ENCOUNTER — Encounter: Payer: Self-pay | Admitting: *Deleted

## 2020-11-25 ENCOUNTER — Inpatient Hospital Stay (HOSPITAL_BASED_OUTPATIENT_CLINIC_OR_DEPARTMENT_OTHER): Payer: 59 | Admitting: Hematology & Oncology

## 2020-11-25 ENCOUNTER — Other Ambulatory Visit: Payer: Self-pay

## 2020-11-25 VITALS — BP 108/59 | HR 97 | Resp 18

## 2020-11-25 VITALS — Wt 211.0 lb

## 2020-11-25 DIAGNOSIS — Z79899 Other long term (current) drug therapy: Secondary | ICD-10-CM | POA: Insufficient documentation

## 2020-11-25 DIAGNOSIS — J910 Malignant pleural effusion: Secondary | ICD-10-CM | POA: Insufficient documentation

## 2020-11-25 DIAGNOSIS — G893 Neoplasm related pain (acute) (chronic): Secondary | ICD-10-CM | POA: Insufficient documentation

## 2020-11-25 DIAGNOSIS — Z5111 Encounter for antineoplastic chemotherapy: Secondary | ICD-10-CM | POA: Diagnosis present

## 2020-11-25 DIAGNOSIS — C786 Secondary malignant neoplasm of retroperitoneum and peritoneum: Secondary | ICD-10-CM | POA: Diagnosis not present

## 2020-11-25 DIAGNOSIS — C252 Malignant neoplasm of tail of pancreas: Secondary | ICD-10-CM

## 2020-11-25 DIAGNOSIS — Z79891 Long term (current) use of opiate analgesic: Secondary | ICD-10-CM | POA: Diagnosis not present

## 2020-11-25 DIAGNOSIS — C253 Malignant neoplasm of pancreatic duct: Secondary | ICD-10-CM

## 2020-11-25 LAB — CBC WITH DIFFERENTIAL (CANCER CENTER ONLY)
Abs Immature Granulocytes: 0.03 10*3/uL (ref 0.00–0.07)
Basophils Absolute: 0 10*3/uL (ref 0.0–0.1)
Basophils Relative: 1 %
Eosinophils Absolute: 0.1 10*3/uL (ref 0.0–0.5)
Eosinophils Relative: 2 %
HCT: 34.8 % — ABNORMAL LOW (ref 36.0–46.0)
Hemoglobin: 11.1 g/dL — ABNORMAL LOW (ref 12.0–15.0)
Immature Granulocytes: 1 %
Lymphocytes Relative: 23 %
Lymphs Abs: 1.4 10*3/uL (ref 0.7–4.0)
MCH: 27.6 pg (ref 26.0–34.0)
MCHC: 31.9 g/dL (ref 30.0–36.0)
MCV: 86.6 fL (ref 80.0–100.0)
Monocytes Absolute: 0.6 10*3/uL (ref 0.1–1.0)
Monocytes Relative: 10 %
Neutro Abs: 3.8 10*3/uL (ref 1.7–7.7)
Neutrophils Relative %: 63 %
Platelet Count: 464 10*3/uL — ABNORMAL HIGH (ref 150–400)
RBC: 4.02 MIL/uL (ref 3.87–5.11)
RDW: 16.1 % — ABNORMAL HIGH (ref 11.5–15.5)
WBC Count: 5.9 10*3/uL (ref 4.0–10.5)
nRBC: 0 % (ref 0.0–0.2)

## 2020-11-25 LAB — CEA (IN HOUSE-CHCC): CEA (CHCC-In House): 36.12 ng/mL — ABNORMAL HIGH (ref 0.00–5.00)

## 2020-11-25 LAB — CMP (CANCER CENTER ONLY)
ALT: 12 U/L (ref 0–44)
AST: 13 U/L — ABNORMAL LOW (ref 15–41)
Albumin: 3.8 g/dL (ref 3.5–5.0)
Alkaline Phosphatase: 48 U/L (ref 38–126)
Anion gap: 7 (ref 5–15)
BUN: 12 mg/dL (ref 6–20)
CO2: 27 mmol/L (ref 22–32)
Calcium: 9.4 mg/dL (ref 8.9–10.3)
Chloride: 100 mmol/L (ref 98–111)
Creatinine: 0.64 mg/dL (ref 0.44–1.00)
GFR, Estimated: >60 mL/min (ref >60)
Glucose, Bld: 194 mg/dL — ABNORMAL HIGH (ref 70–99)
Potassium: 4.2 mmol/L (ref 3.5–5.1)
Sodium: 134 mmol/L — ABNORMAL LOW (ref 135–145)
Total Bilirubin: 0.4 mg/dL (ref 0.3–1.2)
Total Protein: 7 g/dL (ref 6.5–8.1)

## 2020-11-25 LAB — LACTATE DEHYDROGENASE: LDH: 135 U/L (ref 98–192)

## 2020-11-25 MED ORDER — DEXTROSE 5 % IV SOLN
Freq: Once | INTRAVENOUS | Status: AC
Start: 2020-11-25 — End: 2020-11-25
  Filled 2020-11-25: qty 250

## 2020-11-25 MED ORDER — PALONOSETRON HCL INJECTION 0.25 MG/5ML
INTRAVENOUS | Status: AC
  Filled 2020-11-25: qty 5

## 2020-11-25 MED ORDER — SODIUM CHLORIDE 0.9 % IV SOLN
150.0000 mg | Freq: Once | INTRAVENOUS | Status: AC
  Administered 2020-11-25: 150 mg via INTRAVENOUS
  Filled 2020-11-25: qty 150

## 2020-11-25 MED ORDER — OXALIPLATIN CHEMO INJECTION 100 MG/20ML
63.7500 mg/m2 | Freq: Once | INTRAVENOUS | Status: AC
  Administered 2020-11-25: 135 mg via INTRAVENOUS
  Filled 2020-11-25: qty 20

## 2020-11-25 MED ORDER — PALONOSETRON HCL INJECTION 0.25 MG/5ML
0.2500 mg | Freq: Once | INTRAVENOUS | Status: AC
  Administered 2020-11-25: 0.25 mg via INTRAVENOUS

## 2020-11-25 MED ORDER — DIPHENHYDRAMINE HCL 50 MG/ML IJ SOLN
50.0000 mg | Freq: Once | INTRAMUSCULAR | Status: AC | PRN
  Administered 2020-11-25: 25 mg via INTRAVENOUS

## 2020-11-25 MED ORDER — DEXTROSE 5 % IV SOLN
Freq: Once | INTRAVENOUS | Status: AC
  Filled 2020-11-25: qty 250

## 2020-11-25 MED ORDER — FLUOROURACIL CHEMO INJECTION 5 GM/100ML
1920.0000 mg/m2 | INTRAVENOUS | Status: DC
  Administered 2020-11-25: 4000 mg via INTRAVENOUS
  Filled 2020-11-25: qty 80

## 2020-11-25 MED ORDER — SODIUM CHLORIDE 0.9 % IV SOLN
112.5000 mg/m2 | Freq: Once | INTRAVENOUS | Status: AC
  Administered 2020-11-25: 240 mg via INTRAVENOUS
  Filled 2020-11-25: qty 10

## 2020-11-25 MED ORDER — ATROPINE SULFATE 1 MG/ML IJ SOLN
0.5000 mg | Freq: Once | INTRAMUSCULAR | Status: AC | PRN
  Administered 2020-11-25: 0.5 mg via INTRAVENOUS

## 2020-11-25 MED ORDER — FAMOTIDINE IN NACL 20-0.9 MG/50ML-% IV SOLN
20.0000 mg | Freq: Once | INTRAVENOUS | Status: AC | PRN
  Administered 2020-11-25: 20 mg via INTRAVENOUS

## 2020-11-25 MED ORDER — HYDROMORPHONE HCL 1 MG/ML IJ SOLN
2.0000 mg | Freq: Once | INTRAMUSCULAR | Status: AC
Start: 2020-11-25 — End: 2020-11-25
  Administered 2020-11-25: 2 mg via INTRAVENOUS

## 2020-11-25 MED ORDER — METHYLPREDNISOLONE SODIUM SUCC 125 MG IJ SOLR
125.0000 mg | Freq: Once | INTRAMUSCULAR | Status: AC | PRN
  Administered 2020-11-25: 80 mg via INTRAVENOUS

## 2020-11-25 MED ORDER — HYDROMORPHONE HCL 1 MG/ML IJ SOLN
INTRAMUSCULAR | Status: AC
  Filled 2020-11-25: qty 2

## 2020-11-25 MED ORDER — SODIUM CHLORIDE 0.9 % IV SOLN
10.0000 mg | Freq: Once | INTRAVENOUS | Status: AC
  Administered 2020-11-25: 10 mg via INTRAVENOUS
  Filled 2020-11-25: qty 10

## 2020-11-25 MED ORDER — SODIUM CHLORIDE 0.9% FLUSH
10.0000 mL | INTRAVENOUS | Status: DC | PRN
  Filled 2020-11-25: qty 10

## 2020-11-25 MED ORDER — HYDROMORPHONE HCL 1 MG/ML IJ SOLN
INTRAMUSCULAR | Status: AC
  Filled 2020-11-25: qty 1

## 2020-11-25 MED ORDER — ATROPINE SULFATE 1 MG/ML IJ SOLN
INTRAMUSCULAR | Status: AC
  Filled 2020-11-25: qty 1

## 2020-11-25 MED ORDER — LEUCOVORIN CALCIUM INJECTION 350 MG
400.0000 mg/m2 | Freq: Once | INTRAMUSCULAR | Status: AC
  Administered 2020-11-25: 832 mg via INTRAVENOUS
  Filled 2020-11-25: qty 25

## 2020-11-25 MED ORDER — GABAPENTIN 300 MG PO CAPS: 300.0000 mg | ORAL_CAPSULE | Freq: Two times a day (BID) | ORAL | 3 refills | Status: DC

## 2020-11-25 MED ORDER — HEPARIN SOD (PORK) LOCK FLUSH 100 UNIT/ML IV SOLN
500.0000 [IU] | Freq: Once | INTRAVENOUS | Status: DC | PRN
  Filled 2020-11-25: qty 5

## 2020-11-25 MED FILL — GABAPENTIN 300 MG CAPSULE: 300 | 30 days supply | Qty: 90 | Fill #0

## 2020-11-25 NOTE — Patient Instructions (Signed)

## 2020-11-25 NOTE — Patient Instructions (Addendum)
[facility] Discharge Instructions for Patients Receiving Chemotherapy  Today you received the following chemotherapy agents Oxaliplatin, Leucovorin, Irinotecan and 5FU  To help prevent nausea and vomiting after your treatment, we encourage you to take your nausea medication as prescribed by MD.  **DO NOT TAKE ZOFRAN FOR 3 DAYS AFTER CHEMOTHERAPY**  *COME BACK Friday AT 2:30 to have pump removed*  If you develop nausea and vomiting that is not controlled by your nausea medication, call the clinic.   BELOW ARE SYMPTOMS THAT SHOULD BE REPORTED IMMEDIATELY:  *FEVER GREATER THAN 100.5 F  *CHILLS WITH OR WITHOUT FEVER  NAUSEA AND VOMITING THAT IS NOT CONTROLLED WITH YOUR NAUSEA MEDICATION  *UNUSUAL SHORTNESS OF BREATH  *UNUSUAL BRUISING OR BLEEDING  TENDERNESS IN MOUTH AND THROAT WITH OR WITHOUT PRESENCE OF ULCERS  *URINARY PROBLEMS  *BOWEL PROBLEMS  UNUSUAL RASH Items with * indicate a potential emergency and should be followed up as soon as possible.  Feel free to call the clinic should you have any questions or concerns. The clinic phone number is [phone].  Please show the [place] at check-in to the Emergency Department and triage nurse.

## 2020-11-25 NOTE — Progress Notes (Signed)
Pt. Is accompanied by daughter who is interpreting for her.

## 2020-11-25 NOTE — Progress Notes (Signed)
Patient here to begin new treatment regimen after progression. Her daughter is with her in treatment. Answered some questions related to the new treatment regimen. Patient continues to struggle with pain, and decreased appetite. She will get some pain medicine today.  Oncology Nurse Navigator Documentation  Oncology Nurse Navigator Flowsheets 11/25/2020  Confirmed Diagnosis Date -  Diagnosis Status -  Planned Course of Treatment -  Phase of Treatment -  Chemotherapy Pending- Reason: -  Chemotherapy Actual Start Date: -  Navigator Follow Up Date: 12/08/2020  Navigator Follow Up Reason: Follow-up Appointment;Chemotherapy  Navigator Location [facility]  Referral Date to RadOnc/MedOnc -  Navigator Encounter Type Treatment;Appt/Treatment Plan Review  Telephone -  Treatment Initiated Date -  Patient Visit Type MedOnc  Treatment Phase Active Tx  Barriers/Navigation Needs Coordination of Care;Cultural Needs;Family Concerns;Language/Communication;Education  Education Other  Interventions Education;Psycho-Social Support  Acuity Level 3-Moderate Needs (3-4 Barriers Identified)  Referrals -  Coordination of Care -  Education Method Verbal  Support Groups/Services Friends and Family  Time Spent with Patient 30

## 2020-11-25 NOTE — Progress Notes (Signed)
Hematology and Oncology Follow Up Visit  Angela Wilson [id number] 01-25-1966 55 y.o. 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 is in for follow-up.  Unfortunately, she is progressing now.  We did do a CT scan on her.  The CT scan unfortunately did show she did have tumor progression.  She had peritoneal and omental progression.  She still having problems with abdominal pain. I suspect that this is probably from the peritoneal disease.  We did go ahead and repeat her tumor markers.  The CA-125 was 163 and his CEA was 26.  These are usually good markers for her.  We will have to make a change in her protocol.  I do think that FOLFIRINOX would not be a bad idea for her.  I realize this is going to be a little bit more of a hassle.  She has had a portable pump.  However, this is a totally new regimen and I think she will respond to this.  I just want her quality life to be better.  She is not throwing up.  She is not eating all that much.  There is no diarrhea.  She is having no constipation.  Currently, her performance status is ECOG 1.   Medications:  Current Outpatient Medications:  .  albuterol (PROVENTIL HFA;VENTOLIN HFA) 108 (90 Base) MCG/ACT inhaler, Inhale 2 puffs into the lungs every 4 (four) hours as needed for wheezing or shortness of breath. (Patient not taking: Reported on 10/27/2020), Disp: 1 Inhaler, Rfl: 0 .  amLODipine (NORVASC) 5 MG tablet, Take 1 tablet (5 mg total) by mouth daily., Disp: 90 tablet, Rfl: 3 .  aspirin 81 MG chewable tablet, Chew 81 mg by mouth daily. , Disp: , Rfl:  .  cetirizine (ZYRTEC) 10 MG tablet, Take 1 tablet (10 mg total) by mouth daily., Disp: 30 tablet, Rfl: 3 .  CREON  36000-114000 units CPEP capsule, , Disp: , Rfl:  .  dronabinol (MARINOL) 2.5 MG capsule, Take 1 capsule (2.5 mg total) by mouth 2 (two) times daily before lunch and supper., Disp: 60 capsule, Rfl: 0 .  ergocalciferol (VITAMIN D2) 1.25 MG (50000 UT) capsule, Take 1 capsule (50,000 Units total) by mouth once a week., Disp: 6 capsule, Rfl: 3 .  fentaNYL (DURAGESIC) 75 MCG/HR, Place 1 patch onto the skin every 3 (three) days., Disp: 10 patch, Rfl: 0 .  glipiZIDE (GLUCOTROL) 10 MG tablet, Take 1 tablet (10 mg total) by mouth 2 (two) times daily., Disp: 60 tablet, Rfl: 1 .  HYDROcodone-homatropine (HYCODAN) 5-1.5 MG/5ML syrup, Take 5 mLs by mouth every 6 (six) hours as needed for cough., Disp: 120 mL, Rfl: 0 .  HYDROmorphone (DILAUDID) 2 MG tablet, Take 0.5 tablets (1 mg total) by mouth every 6 (six) hours as needed for severe pain., Disp: 60 tablet, Rfl: 0 .  lidocaine-prilocaine (EMLA) cream, Apply to port  1 hour before access., Disp: 30 g, Rfl: 3 .  lisinopril (ZESTRIL) 10 MG tablet, Take 1 tablet (10 mg total) by mouth daily., Disp: 90 tablet, Rfl: 1 .  loperamide (IMODIUM A-D) 2 MG tablet, Take 2 at onset of diarrhea, then 1 every 2hrs until 12hr without a BM. May take 2 tab every 4hrs  at bedtime. If diarrhea recurs repeat., Disp: 100 tablet, Rfl: 1 .  magic mouthwash w/lidocaine SOLN, Take 5 mLs by mouth 3 (three) times daily as needed for mouth pain., Disp: 450 mL, Rfl: 1 .  metFORMIN (GLUCOPHAGE XR) 500 MG 24 hr tablet, Take 1 tablet (500 mg total) by mouth daily with breakfast., Disp: 90 tablet, Rfl: 1 .  omeprazole (PRILOSEC) 40 MG capsule, Take 1 capsule (40 mg total) by mouth daily., Disp: 90 capsule, Rfl: 2 .  ondansetron (ZOFRAN) 4 MG tablet, Take 1 tablet (4 mg total) by mouth every 6 (six) hours as needed for nausea., Disp: 20 tablet, Rfl: 0 .  polyethylene glycol (MIRALAX / GLYCOLAX) 17 g packet, Take 17 g by mouth daily as needed., Disp: 14 each, Rfl: 0 .  prochlorperazine (COMPAZINE) 10  MG tablet, Take 1 tablet (10 mg total) by mouth every 6 (six) hours as needed for nausea or vomiting., Disp: 90 tablet, Rfl: 3 .  Pyridoxine HCl (VITAMIN B-6) 250 MG tablet, Take 1 tablet (250 mg total) by mouth daily., Disp: 30 tablet, Rfl: 6  Allergies:  Allergies  Allergen Reactions  . Iodinated Diagnostic Agents Itching and Rash  . Ivp Dye [Iodinated Diagnostic Agents] Itching and Rash  . Morphine And Related Hives  . Penicillins Rash    Past Medical History, Surgical history, Social history, and Family History were reviewed and updated.  Review of Systems: Review of Systems  Constitutional: Negative.   HENT:  Negative.   Eyes: Negative.   Respiratory: Negative.   Cardiovascular: Negative.   Gastrointestinal: Positive for abdominal pain and nausea.  Endocrine: Negative.   Genitourinary: Negative.    Musculoskeletal: Negative.   Skin: Negative.   Neurological: Negative.   Hematological: Negative.   Psychiatric/Behavioral: Negative.     Physical Exam:  weight is 211 lb (95.7 kg).   Wt Readings from Last 3 Encounters:  11/25/20 211 lb (95.7 kg)  10/27/20 212 lb (96.2 kg)  09/22/20 208 lb 4 oz (94.5 kg)   Her vital signs show a temperature of 97.1.  Pulse is 100.  Blood pressure 116/70.  Weight is 230 pounds.  Physical Exam Vitals reviewed.  HENT:     Head: Normocephalic and atraumatic.  Eyes:     Pupils: Pupils are equal, round, and reactive to light.  Cardiovascular:     Rate and Rhythm: Normal rate and regular rhythm.     Heart sounds: Normal heart sounds.  Pulmonary:     Effort: Pulmonary effort is normal.     Breath sounds: Normal breath sounds.  Abdominal:     General: Bowel sounds are normal.     Palpations: Abdomen is soft.  Musculoskeletal:        General: No tenderness or deformity. Normal range of motion.     Cervical back: Normal range of motion.  Lymphadenopathy:     Cervical: No cervical adenopathy.  Skin:    General: Skin is warm and dry.      Findings: No erythema or rash.  Neurological:     Mental Status: She is alert and oriented to person, place, and time.  Psychiatric:        Behavior: Behavior normal.        Thought Content: Thought content normal.        Judgment: Judgment normal.      Lab Results  Component Value Date   WBC 5.9 11/25/2020   HGB 11.1 (L) 11/25/2020   HCT 34.8 (L) 11/25/2020  MCV 86.6 11/25/2020   PLT 464 (H) 11/25/2020     Chemistry      Component Value Date/Time   NA 134 (L) 11/25/2020 0911   NA 136 01/07/2020 1256   K 4.2 11/25/2020 0911   CL 100 11/25/2020 0911   CO2 27 11/25/2020 0911   BUN 12 11/25/2020 0911   BUN 10 01/07/2020 1256   CREATININE 0.64 11/25/2020 0911      Component Value Date/Time   CALCIUM 9.4 11/25/2020 0911   ALKPHOS 48 11/25/2020 0911   AST 13 (L) 11/25/2020 0911   ALT 12 11/25/2020 0911   BILITOT 0.4 11/25/2020 0911       Impression and Plan: [name] is a very nice 55 year old woman from [place].  She has metastatic pancreatic cancer.  She has been on treatment now for about 8 months.  She was off treatment for about 6 weeks because she went over to [place] to see a new grandchild.  I wanted to make sure that she could make it over there as it was incredibly important to her to see this grandchild.  Again we will have to make the change in treatment.  We will try her on FOLFIRINOX.   The tumor markers will show [place] how she is doing.  We are doing a little bit of a dose reduction.  I think this will be helpful for her.  We will try to get her back in 2 more weeks for her next treatment.  After 4 treatments, we will then repeat our scans.  I will try her on some Neurontin and see how this might help with some of the pain that she has.     Volanda Napoleon, MD 3/9/20229:50 AM

## 2020-11-25 NOTE — Progress Notes (Signed)
1405: Pt c/o itching to chest and arms. Pt also c/o abdominal discomfort. Pt denies any chest or back pain. Respirations equal and unlabored. Chemotherapy infusion stopped at this time and NS started per protocol. Vitals obtained and within normal limits; refer to flowsheets. Pt skin warm and dry. No rash noted to chest or arms.  1409: Laverna Peace, NP at bedside to evaluate pt. Pt medicated per orders; refer to [facility].   1415: Pt and family member updated on plan of care and agreeable to plan. Pt aware that 30 minutes post Pepcid will re-challenge pt with chemotherapy. Pt educated on all medications given and purpose. Pt and family member aware and had no further questions.   1430: Pt resting in recliner with unlabored and equal respirations. Skin warm and dry. No pain indications. Will continue to monitor.

## 2020-11-25 NOTE — Telephone Encounter (Signed)
appts made per 11/25/20 los and pt will rec appts in tx/avs and at 11/27/20 appt   Angela Wilson

## 2020-11-26 LAB — CA 125: Cancer Antigen (CA) 125: 257 U/mL — ABNORMAL HIGH (ref 0.0–38.1)

## 2020-11-27 ENCOUNTER — Inpatient Hospital Stay: Payer: 59

## 2020-11-27 ENCOUNTER — Other Ambulatory Visit: Payer: Self-pay | Admitting: *Deleted

## 2020-11-27 ENCOUNTER — Other Ambulatory Visit: Payer: Self-pay

## 2020-11-27 VITALS — BP 110/79 | HR 75 | Temp 98.4°F | Resp 19

## 2020-11-27 DIAGNOSIS — C252 Malignant neoplasm of tail of pancreas: Secondary | ICD-10-CM

## 2020-11-27 DIAGNOSIS — Z5111 Encounter for antineoplastic chemotherapy: Secondary | ICD-10-CM | POA: Diagnosis not present

## 2020-11-27 MED ORDER — DRONABINOL 2.5 MG PO CAPS: 2.5000 mg | ORAL_CAPSULE | Freq: Two times a day (BID) | ORAL | 0 refills | Status: DC

## 2020-11-27 MED ORDER — SODIUM CHLORIDE 0.9% FLUSH
10.0000 mL | INTRAVENOUS | Status: DC | PRN
  Administered 2020-11-27: 10 mL
  Filled 2020-11-27: qty 10

## 2020-11-27 MED ORDER — PEGFILGRASTIM-CBQV 6 MG/0.6ML ~~LOC~~ SOSY
PREFILLED_SYRINGE | SUBCUTANEOUS | Status: AC
  Filled 2020-11-27: qty 0.6

## 2020-11-27 MED ORDER — HEPARIN SOD (PORK) LOCK FLUSH 100 UNIT/ML IV SOLN
500.0000 [IU] | Freq: Once | INTRAVENOUS | Status: AC | PRN
  Administered 2020-11-27: 500 [IU]
  Filled 2020-11-27: qty 5

## 2020-11-27 NOTE — Patient Instructions (Signed)
Tunneled Central Venous Catheter Flushing Guide  It is important to flush your tunneled central venous catheter each time you use it, both before and after you use it. Flushing your catheter will help prevent it from clogging. What are the risks? Risks may include:  Infection.  Air getting into the catheter and bloodstream. Supplies needed:  A clean pair of gloves.  A disinfecting wipe. Use an alcohol wipe, chlorhexidine wipe, or iodine wipe as told by your health care provider.  A 10 mL syringe that has been prefilled with saline solution.  An empty 10 mL syringe, if a substance called heparin was injected into your catheter. How to flush your catheter When you flush your catheter, make sure you follow any specific instructions from your health care provider or the manufacturer. These are general guidelines. Flushing your catheter before use If there is heparin in your catheter: 1. Wash your hands with soap and water. 2. Put on gloves. 3. Scrub the injection cap for a minimum of 15 seconds with a disinfecting wipe. 4. Unclamp the catheter. 5. Attach the empty syringe to the injection cap. 6. Pull the syringe plunger back and withdraw 10 mL of blood. 7. Place the syringe into an appropriate waste container. 8. Scrub the injection cap for 15 seconds with a disinfecting wipe. 9. Attach the prefilled syringe to the injection cap. 10. Flush the catheter by pushing the plunger forward until all the liquid from the syringe is in the catheter. 11. Remove the syringe from the injection cap. 12. Clamp the catheter. If there is no heparin in your catheter: 1. Wash your hands with soap and water. 2. Put on gloves. 3. Scrub the injection cap for 15 seconds with a disinfecting wipe. 4. Unclamp the catheter. 5. Attach the prefilled syringe to the injection cap. 6. Flush the catheter by pushing the plunger forward until 5 mL of the liquid from the syringe is in the catheter. 7. Pull back on  the syringe until you see blood in the catheter. 8. If you have been asked to collect any blood, follow your health care provider's instructions. Otherwise, flush the catheter with the rest of the solution from the syringe. 9. Remove the syringe from the injection cap. 10. Clamp the catheter.   Flushing your catheter after use 1. Wash your hands with soap and water. 2. Put on gloves. 3. Scrub the injection cap for 15 seconds with a disinfecting wipe. 4. Unclamp the catheter. 5. Attach the prefilled syringe to the injection cap. 6. Flush the catheter by pushing the plunger forward until all of the liquid from the syringe is in the catheter. 7. Remove the syringe from the injection cap. 8. Clamp the catheter. Problems and solutions  If blood cannot be completely cleared from the injection cap, you may need to have the injection cap replaced.  If the catheter is difficult to flush, use the pulsing method. The pulsing method involves pushing only a few milliliters of solution into the catheter at a time and pausing between pushes.  If you do not see blood in the catheter when you pull back on the syringe, change your body position, such as by raising your arms above your head. Take a deep breath and cough. Then, pull back on the syringe. If you still do not see blood, flush the catheter with a small amount of solution. Then, change positions again and take a breath or cough. Pull back on the syringe again. If you still do not   see blood, finish flushing the catheter and contact your health care provider. Do not use your catheter until your health care provider says it is okay. General tips  Have someone help you flush your catheter, if possible.  Do not force fluid through your catheter.  Do not use a syringe that is larger or smaller than 10 mL. Using a smaller syringe can make the catheter burst.  Do not use your catheter without flushing it first if it has heparin in it. Contact a health  care provider if:  You cannot see any blood in the catheter when you flush it before using it.  Your catheter is difficult to flush. Get help right away if:  You cannot flush the catheter.  The catheter leaks when you flush it or when there is fluid in it.  There are cracks or breaks in the catheter. Summary  It is important to flush your tunneled central venous catheter each time you use it, both before and after you use it.  Scrub the injection cap for 15 seconds with a disinfecting wipe before and after you flush it.  When you flush your catheter, make sure you follow any specific instructions from your health care provider or the manufacturer.  Get help right away if you cannot flush the catheter. This information is not intended to replace advice given to you by your health care provider. Make sure you discuss any questions you have with your health care provider. Document Revised: 11/14/2019 Document Reviewed: 11/21/2018 Elsevier Patient Education  2021 [employer] Inc.  

## 2020-11-30 ENCOUNTER — Encounter: Payer: Self-pay | Admitting: *Deleted

## 2020-12-02 ENCOUNTER — Encounter: Payer: Self-pay | Admitting: *Deleted

## 2020-12-03 ENCOUNTER — Encounter: Payer: Self-pay | Admitting: *Deleted

## 2020-12-03 NOTE — Progress Notes (Signed)
Patient continues to have pain. Spoke to [facility] NP and she would like patient to increase am dose of Neurontin to 2 tablets, and add 2 tablets at lunch thereby making the dosing 2 tablets TID. If patient is still having pain over the weekend, she can increase her dilaudid to q4hrs prn.   Patient is already scheduled on Tuesday for follow up.   Oncology Nurse Navigator Documentation  Oncology Nurse Navigator Flowsheets 12/03/2020  Confirmed Diagnosis Date -  Diagnosis Status -  Planned Course of Treatment -  Phase of Treatment -  Chemotherapy Pending- Reason: -  Chemotherapy Actual Start Date: -  Navigator Follow Up Date: 12/08/2020  Navigator Follow Up Reason: Follow-up Appointment;Chemotherapy  Navigator Location [facility]  Referral Date to RadOnc/MedOnc -  Navigator Encounter Type Treatment  Telephone Symptom Mgt;Incoming Call  Treatment Initiated Date -  Patient Visit Type MedOnc  Treatment Phase Active Tx  Barriers/Navigation Needs Coordination of Care;Cultural Needs;Family Concerns;Language/Communication;Education  Education Pain/ Symptom Management  Interventions Education;Psycho-Social Support  Acuity Level 3-Moderate Needs (3-4 Barriers Identified)  Referrals -  Coordination of Care -  Education Method Verbal;Teach-back  Support Groups/Services Friends and Family  Time Spent with Patient 30

## 2020-12-08 ENCOUNTER — Inpatient Hospital Stay: Payer: 59

## 2020-12-08 ENCOUNTER — Encounter: Payer: Self-pay | Admitting: Hematology & Oncology

## 2020-12-08 ENCOUNTER — Other Ambulatory Visit: Payer: Self-pay | Admitting: *Deleted

## 2020-12-08 ENCOUNTER — Other Ambulatory Visit: Payer: Self-pay | Admitting: Hematology & Oncology

## 2020-12-08 ENCOUNTER — Ambulatory Visit (HOSPITAL_BASED_OUTPATIENT_CLINIC_OR_DEPARTMENT_OTHER)
Admission: RE | Admit: 2020-12-08 | Discharge: 2020-12-08 | Disposition: A | Discharge status: Home / Self Care | Payer: 59 | Source: Ambulatory Visit | Attending: Hematology & Oncology | Admitting: Hematology & Oncology

## 2020-12-08 ENCOUNTER — Inpatient Hospital Stay (HOSPITAL_BASED_OUTPATIENT_CLINIC_OR_DEPARTMENT_OTHER): Payer: 59 | Admitting: Hematology & Oncology

## 2020-12-08 ENCOUNTER — Encounter: Payer: Self-pay | Admitting: *Deleted

## 2020-12-08 ENCOUNTER — Other Ambulatory Visit: Payer: Self-pay

## 2020-12-08 VITALS — BP 131/65 | HR 103 | Temp 98.9°F | Resp 18 | Wt 208.5 lb

## 2020-12-08 VITALS — BP 109/79 | HR 92 | Temp 98.8°F | Resp 17

## 2020-12-08 DIAGNOSIS — C251 Malignant neoplasm of body of pancreas: Secondary | ICD-10-CM

## 2020-12-08 DIAGNOSIS — Z5111 Encounter for antineoplastic chemotherapy: Secondary | ICD-10-CM | POA: Diagnosis not present

## 2020-12-08 DIAGNOSIS — C252 Malignant neoplasm of tail of pancreas: Secondary | ICD-10-CM

## 2020-12-08 DIAGNOSIS — Z95828 Presence of other vascular implants and grafts: Secondary | ICD-10-CM

## 2020-12-08 LAB — CMP (CANCER CENTER ONLY)
ALT: 11 U/L (ref 0–44)
AST: 12 U/L — ABNORMAL LOW (ref 15–41)
Albumin: 3.8 g/dL (ref 3.5–5.0)
Alkaline Phosphatase: 53 U/L (ref 38–126)
Anion gap: 6 (ref 5–15)
BUN: 13 mg/dL (ref 6–20)
CO2: 29 mmol/L (ref 22–32)
Calcium: 9.5 mg/dL (ref 8.9–10.3)
Chloride: 100 mmol/L (ref 98–111)
Creatinine: 0.58 mg/dL (ref 0.44–1.00)
GFR, Estimated: >60 mL/min (ref >60)
Glucose, Bld: 168 mg/dL — ABNORMAL HIGH (ref 70–99)
Potassium: 4.5 mmol/L (ref 3.5–5.1)
Sodium: 135 mmol/L (ref 135–145)
Total Bilirubin: 0.3 mg/dL (ref 0.3–1.2)
Total Protein: 7.3 g/dL (ref 6.5–8.1)

## 2020-12-08 LAB — CBC WITH DIFFERENTIAL (CANCER CENTER ONLY)
Abs Immature Granulocytes: 0.03 10*3/uL (ref 0.00–0.07)
Basophils Absolute: 0 10*3/uL (ref 0.0–0.1)
Basophils Relative: 0 %
Eosinophils Absolute: 0.3 10*3/uL (ref 0.0–0.5)
Eosinophils Relative: 4 %
HCT: 35.7 % — ABNORMAL LOW (ref 36.0–46.0)
Hemoglobin: 11.4 g/dL — ABNORMAL LOW (ref 12.0–15.0)
Immature Granulocytes: 1 %
Lymphocytes Relative: 18 %
Lymphs Abs: 1.2 10*3/uL (ref 0.7–4.0)
MCH: 27.1 pg (ref 26.0–34.0)
MCHC: 31.9 g/dL (ref 30.0–36.0)
MCV: 85 fL (ref 80.0–100.0)
Monocytes Absolute: 0.5 10*3/uL (ref 0.1–1.0)
Monocytes Relative: 7 %
Neutro Abs: 4.6 10*3/uL (ref 1.7–7.7)
Neutrophils Relative %: 70 %
Platelet Count: 235 10*3/uL (ref 150–400)
RBC: 4.2 MIL/uL (ref 3.87–5.11)
RDW: 15.5 % (ref 11.5–15.5)
WBC Count: 6.5 10*3/uL (ref 4.0–10.5)
nRBC: 0 % (ref 0.0–0.2)

## 2020-12-08 LAB — CEA (IN HOUSE-CHCC): CEA (CHCC-In House): 37.1 ng/mL — ABNORMAL HIGH (ref 0.00–5.00)

## 2020-12-08 MED ORDER — SODIUM CHLORIDE 0.9 % IV SOLN
150.0000 mg | Freq: Once | INTRAVENOUS | Status: AC
  Administered 2020-12-08: 150 mg via INTRAVENOUS
  Filled 2020-12-08: qty 150

## 2020-12-08 MED ORDER — OXALIPLATIN CHEMO INJECTION 100 MG/20ML
63.7500 mg/m2 | Freq: Once | INTRAVENOUS | Status: AC
  Administered 2020-12-08: 135 mg via INTRAVENOUS
  Filled 2020-12-08: qty 20

## 2020-12-08 MED ORDER — DEXTROSE 5 % IV SOLN
Freq: Once | INTRAVENOUS | Status: AC
  Filled 2020-12-08: qty 250

## 2020-12-08 MED ORDER — GABAPENTIN 300 MG PO CAPS: 300.0000 mg | ORAL_CAPSULE | Freq: Four times a day (QID) | ORAL | 3 refills | Status: DC

## 2020-12-08 MED ORDER — ALTEPLASE 2 MG IJ SOLR
2.0000 mg | Freq: Once | INTRAMUSCULAR | Status: AC
  Administered 2020-12-08: 2 mg
  Filled 2020-12-08: qty 2

## 2020-12-08 MED ORDER — ALTEPLASE 2 MG IJ SOLR
INTRAMUSCULAR | Status: AC
  Filled 2020-12-08: qty 2

## 2020-12-08 MED ORDER — HYDROMORPHONE HCL 2 MG PO TABS: 1.0000 mg | ORAL_TABLET | ORAL | 0 refills | Status: DC | PRN

## 2020-12-08 MED ORDER — HYDROMORPHONE HCL 1 MG/ML IJ SOLN
INTRAMUSCULAR | Status: AC
  Filled 2020-12-08: qty 2

## 2020-12-08 MED ORDER — SODIUM CHLORIDE 0.9 % IV SOLN
112.5000 mg/m2 | Freq: Once | INTRAVENOUS | Status: AC
  Administered 2020-12-08: 240 mg via INTRAVENOUS
  Filled 2020-12-08: qty 10

## 2020-12-08 MED ORDER — FENTANYL 75 MCG/HR TD PT72: 1.0000 | MEDICATED_PATCH | TRANSDERMAL | 0 refills | Status: DC

## 2020-12-08 MED ORDER — STERILE WATER FOR INJECTION IJ SOLN
INTRAMUSCULAR | Status: AC
  Filled 2020-12-08: qty 10

## 2020-12-08 MED ORDER — PALONOSETRON HCL INJECTION 0.25 MG/5ML
0.2500 mg | Freq: Once | INTRAVENOUS | Status: AC
  Administered 2020-12-08: 0.25 mg via INTRAVENOUS

## 2020-12-08 MED ORDER — DEXTROSE 5 % IV SOLN
Freq: Once | INTRAVENOUS | Status: AC
Start: 2020-12-08 — End: 2020-12-08
  Filled 2020-12-08: qty 250

## 2020-12-08 MED ORDER — ATROPINE SULFATE 1 MG/ML IJ SOLN
0.5000 mg | Freq: Once | INTRAMUSCULAR | Status: AC | PRN
  Administered 2020-12-08: 0.5 mg via INTRAVENOUS

## 2020-12-08 MED ORDER — HYDROMORPHONE HCL 1 MG/ML IJ SOLN
2.0000 mg | Freq: Once | INTRAMUSCULAR | Status: AC
  Administered 2020-12-08: 2 mg via INTRAVENOUS

## 2020-12-08 MED ORDER — GABAPENTIN 300 MG PO CAPS: ORAL_CAPSULE | ORAL | 3 refills | Status: DC

## 2020-12-08 MED ORDER — SODIUM CHLORIDE 0.9 % IV SOLN
1920.0000 mg/m2 | INTRAVENOUS | Status: DC
  Administered 2020-12-08: 4000 mg via INTRAVENOUS
  Filled 2020-12-08: qty 80

## 2020-12-08 MED ORDER — PALONOSETRON HCL INJECTION 0.25 MG/5ML
INTRAVENOUS | Status: AC
  Filled 2020-12-08: qty 5

## 2020-12-08 MED ORDER — LIDOCAINE-PRILOCAINE 2.5-2.5 % EX CREA: TOPICAL_CREAM | CUTANEOUS | 3 refills | Status: DC

## 2020-12-08 MED ORDER — SODIUM CHLORIDE 0.9 % IV SOLN
10.0000 mg | Freq: Once | INTRAVENOUS | Status: AC
  Administered 2020-12-08: 10 mg via INTRAVENOUS
  Filled 2020-12-08: qty 10

## 2020-12-08 MED ORDER — SODIUM CHLORIDE 0.9 % IV SOLN
400.0000 mg/m2 | Freq: Once | INTRAVENOUS | Status: AC
  Administered 2020-12-08: 832 mg via INTRAVENOUS
  Filled 2020-12-08: qty 41.6

## 2020-12-08 MED ORDER — ATROPINE SULFATE 1 MG/ML IJ SOLN
INTRAMUSCULAR | Status: AC
  Filled 2020-12-08: qty 1

## 2020-12-08 MED FILL — LIDOCAINE-PRILOCAINE CREAM: 2.5-2.5 | 30 days supply | Qty: 30 | Fill #0

## 2020-12-08 MED FILL — HYDROmorphone HCL 2 MG TABS: 2 | 30 days supply | Qty: 90 | Fill #0

## 2020-12-08 MED FILL — fentaNYL 75 MCG/HR PT72: 75 | 20 days supply | Qty: 10 | Fill #0

## 2020-12-08 NOTE — Patient Instructions (Signed)

## 2020-12-08 NOTE — Progress Notes (Signed)
Oncology Nurse Navigator Documentation  Oncology Nurse Navigator Flowsheets 12/08/2020  Confirmed Diagnosis Date -  Diagnosis Status -  Planned Course of Treatment -  Phase of Treatment -  Chemotherapy Pending- Reason: -  Chemotherapy Actual Start Date: -  Navigator Follow Up Date: 12/23/2020  Navigator Follow Up Reason: Follow-up Appointment;Chemotherapy  Navigator Location [facility]  Referral Date to RadOnc/MedOnc -  Navigator Encounter Type Treatment;Appt/Treatment Plan Review  Telephone -  Treatment Initiated Date -  Patient Visit Type MedOnc  Treatment Phase Active Tx  Barriers/Navigation Needs Coordination of Care;Cultural Needs;Family Concerns;Language/Communication;Education  Education -  Interventions Psycho-Social Support  Acuity Level 3-Moderate Needs (3-4 Barriers Identified)  Referrals -  Coordination of Care -  Education Method -  Support Groups/Services Friends and Family  Time Spent with Patient 30

## 2020-12-08 NOTE — Progress Notes (Signed)
Okay to treat today with HR 103 per Dr. Marin Olp.

## 2020-12-08 NOTE — Patient Instructions (Signed)
[facility] Discharge Instructions for Patients Receiving Chemotherapy  Today you received the following chemotherapy agents Oxaliplatin, Leucovorin, Irinotecan and 5FU  To help prevent nausea and vomiting after your treatment, we encourage you to take your nausea medication as prescribed by MD.  **DO NOT TAKE ZOFRAN FOR 3 DAYS AFTER CHEMOTHERAPY**  *COME BACK Friday AT 2:30 to have pump removed*  If you develop nausea and vomiting that is not controlled by your nausea medication, call the clinic.   BELOW ARE SYMPTOMS THAT SHOULD BE REPORTED IMMEDIATELY:  *FEVER GREATER THAN 100.5 F  *CHILLS WITH OR WITHOUT FEVER  NAUSEA AND VOMITING THAT IS NOT CONTROLLED WITH YOUR NAUSEA MEDICATION  *UNUSUAL SHORTNESS OF BREATH  *UNUSUAL BRUISING OR BLEEDING  TENDERNESS IN MOUTH AND THROAT WITH OR WITHOUT PRESENCE OF ULCERS  *URINARY PROBLEMS  *BOWEL PROBLEMS  UNUSUAL RASH Items with * indicate a potential emergency and should be followed up as soon as possible.  Feel free to call the clinic should you have any questions or concerns. The clinic phone number is [phone].  Please show the [place] at check-in to the Emergency Department and triage nurse.

## 2020-12-08 NOTE — Progress Notes (Signed)
Hematology and Oncology Follow Up Visit  Angela Wilson [id number] Dec 05, 1965 55 y.o. 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 is in for follow-up.  Unfortunately, she is still having problems with lower abdominal pain.  Again, this is clearly from the malignancy.  She has only had 1 cycle of FOLFIRINOX to date.  We really have to see how they for her [facility] help.  Hopefully, the FOLFIRINOX will be able to get her pain improved.  I just feel bad for her.  We will try to increase the frequency of the Duragesic patches to 48 hours.  We will see if this may help.  We will also increase the frequency of the gabapentin to 4 times a day.  Otherwise, she looks pretty good.  She really has had no issues with vomiting.  There is been no bleeding.  She has had no obvious change in bowel or bladder habits.  I would like to get an abdominal ultrasound on her today to see if there is any ascites in the abdomen that might be causing some of the pain.  If there is, then we will see if a paracentesis can be performed.  Her last tumor markers showed a CA-125 of 257 and a CEA of 36.  She has had no leg swelling.  There has been no rashes.  Overall, her performance status is ECOG 1.   Medications:  Current Outpatient Medications:  .  amLODipine (NORVASC) 5 MG tablet, Take 1 tablet (5 mg total) by mouth daily., Disp: 90 tablet, Rfl: 3 .  cetirizine (ZYRTEC) 10 MG tablet, Take 1 tablet (10 mg total) by mouth daily., Disp: 30 tablet, Rfl: 3 .  CREON 36000-114000 units CPEP capsule, , Disp: , Rfl:  .  dronabinol (MARINOL) 2.5 MG capsule, Take 1 capsule (2.5 mg total) by mouth 2 (two) times daily before lunch and supper., Disp:  60 capsule, Rfl: 0 .  ergocalciferol (VITAMIN D2) 1.25 MG (50000 UT) capsule, Take 1 capsule (50,000 Units total) by mouth once a week., Disp: 6 capsule, Rfl: 3 .  glipiZIDE (GLUCOTROL) 10 MG tablet, Take 1 tablet (10 mg total) by mouth 2 (two) times daily., Disp: 60 tablet, Rfl: 1 .  HYDROcodone-homatropine (HYCODAN) 5-1.5 MG/5ML syrup, Take 5 mLs by mouth every 6 (six) hours as needed for cough., Disp: 120 mL, Rfl: 0 .  lidocaine-prilocaine (EMLA) cream, Apply to port  1 hour before access., Disp: 30 g, Rfl: 3 .  lisinopril (ZESTRIL) 10 MG tablet, Take 1 tablet (10 mg total) by mouth daily., Disp: 90 tablet, Rfl: 1 .  loperamide (IMODIUM A-D) 2 MG tablet, Take 2 at onset of diarrhea, then 1 every 2hrs until 12hr without a BM. May take 2 tab every 4hrs at bedtime. If diarrhea recurs repeat., Disp: 100 tablet, Rfl: 1 .  magic mouthwash w/lidocaine SOLN, Take 5 mLs by mouth 3 (three) times daily as needed for mouth pain., Disp: 450 mL, Rfl: 1 .  metFORMIN (GLUCOPHAGE XR) 500 MG 24 hr tablet, Take 1 tablet (500 mg total) by mouth daily with breakfast., Disp: 90 tablet, Rfl: 1 .  omeprazole (PRILOSEC) 40 MG capsule, Take 1 capsule (40 mg total) by mouth daily., Disp: 90  capsule, Rfl: 2 .  ondansetron (ZOFRAN) 4 MG tablet, Take 1 tablet (4 mg total) by mouth every 6 (six) hours as needed for nausea., Disp: 20 tablet, Rfl: 0 .  polyethylene glycol (MIRALAX / GLYCOLAX) 17 g packet, Take 17 g by mouth daily as needed., Disp: 14 each, Rfl: 0 .  prochlorperazine (COMPAZINE) 10 MG tablet, Take 1 tablet (10 mg total) by mouth every 6 (six) hours as needed for nausea or vomiting., Disp: 90 tablet, Rfl: 3 .  Pyridoxine HCl (VITAMIN B-6) 250 MG tablet, Take 1 tablet (250 mg total) by mouth daily., Disp: 30 tablet, Rfl: 6 .  albuterol (PROVENTIL HFA;VENTOLIN HFA) 108 (90 Base) MCG/ACT inhaler, Inhale 2 puffs into the lungs every 4 (four) hours as needed for wheezing or shortness of breath. (Patient not taking: No sig  reported), Disp: 1 Inhaler, Rfl: 0 .  aspirin 81 MG chewable tablet, Chew 81 mg by mouth daily.  (Patient not taking: Reported on 12/08/2020), Disp: , Rfl:  .  fentaNYL (DURAGESIC) 75 MCG/HR, Place 1 patch onto the skin every other day., Disp: 15 patch, Rfl: 0 .  gabapentin (NEURONTIN) 300 MG capsule, Take 1 capsule (300 mg total) by mouth 4 (four) times daily. Take 1 capsule in the daytime and 2 capsules at bedtime, Disp: 120 capsule, Rfl: 3 .  HYDROmorphone (DILAUDID) 2 MG tablet, Take 0.5 tablets (1 mg total) by mouth every 4 (four) hours as needed for severe pain., Disp: 90 tablet, Rfl: 0  Allergies:  Allergies  Allergen Reactions  . Iodinated Diagnostic Agents Itching and Rash  . Ivp Dye [Iodinated Diagnostic Agents] Itching and Rash  . Morphine And Related Hives  . Penicillins Rash    Past Medical History, Surgical history, Social history, and Family History were reviewed and updated.  Review of Systems: Review of Systems  Constitutional: Negative.   HENT:  Negative.   Eyes: Negative.   Respiratory: Negative.   Cardiovascular: Negative.   Gastrointestinal: Positive for abdominal pain and nausea.  Endocrine: Negative.   Genitourinary: Negative.    Musculoskeletal: Negative.   Skin: Negative.   Neurological: Negative.   Hematological: Negative.   Psychiatric/Behavioral: Negative.     Physical Exam:  weight is 208 lb 8 oz (94.6 kg). Her oral temperature is 98.9 F (37.2 C). Her blood pressure is 131/65 and her pulse is 103 (abnormal). Her respiration is 18 and oxygen saturation is 99%.   Wt Readings from Last 3 Encounters:  12/08/20 208 lb 8 oz (94.6 kg)  11/25/20 211 lb (95.7 kg)  10/27/20 212 lb (96.2 kg)   Her vital signs show a temperature of 97.1.  Pulse is 100.  Blood pressure 116/70.  Weight is 230 pounds.  Physical Exam Vitals reviewed.  HENT:     Head: Normocephalic and atraumatic.  Eyes:     Pupils: Pupils are equal, round, and reactive to light.   Cardiovascular:     Rate and Rhythm: Normal rate and regular rhythm.     Heart sounds: Normal heart sounds.  Pulmonary:     Effort: Pulmonary effort is normal.     Breath sounds: Normal breath sounds.  Abdominal:     General: Bowel sounds are normal.     Palpations: Abdomen is soft.  Musculoskeletal:        General: No tenderness or deformity. Normal range of motion.     Cervical back: Normal range of motion.  Lymphadenopathy:     Cervical: No cervical adenopathy.  Skin:  General: Skin is warm and dry.     Findings: No erythema or rash.  Neurological:     Mental Status: She is alert and oriented to person, place, and time.  Psychiatric:        Behavior: Behavior normal.        Thought Content: Thought content normal.        Judgment: Judgment normal.      Lab Results  Component Value Date   WBC 6.5 12/08/2020   HGB 11.4 (L) 12/08/2020   HCT 35.7 (L) 12/08/2020   MCV 85.0 12/08/2020   PLT 235 12/08/2020     Chemistry      Component Value Date/Time   NA 135 12/08/2020 0855   NA 136 01/07/2020 1256   K 4.5 12/08/2020 0855   CL 100 12/08/2020 0855   CO2 29 12/08/2020 0855   BUN 13 12/08/2020 0855   BUN 10 01/07/2020 1256   CREATININE 0.58 12/08/2020 0855      Component Value Date/Time   CALCIUM 9.5 12/08/2020 0855   ALKPHOS 53 12/08/2020 0855   AST 12 (L) 12/08/2020 0855   ALT 11 12/08/2020 0855   BILITOT 0.3 12/08/2020 0855       Impression and Plan: [name] is a very nice 54 year old woman from [place].  She has metastatic pancreatic cancer.  She has been on treatment now for about 8 months.  She was off treatment for about 6 weeks because she went over to [place] to see a new grandchild.  I wanted to make sure that she could make it over there as it was incredibly important to her to see this grandchild.  We will go ahead with her second cycle of FOLFIRINOX.  Hopefully, this will help her out.  We will see what the abdominal ultrasound has  to show.  Hopefully change in the frequency of her Duragesic patch in the gabapentin will help a little bit.  I brought up the possibilities and radiation therapy.  This might be a reasonable option if we see no response to chemotherapy.  We can focus radiation right to where her pain is and try to help with the discomfort she is having.  We will plan for another follow-up in 2 weeks.  We can get her in sooner if she has any problems.   Volanda Napoleon, MD 3/22/202210:35 AM

## 2020-12-09 ENCOUNTER — Telehealth: Payer: Self-pay | Admitting: *Deleted

## 2020-12-09 ENCOUNTER — Encounter: Payer: Self-pay | Admitting: *Deleted

## 2020-12-09 LAB — CA 125: Cancer Antigen (CA) 125: 369 U/mL — ABNORMAL HIGH (ref 0.0–38.1)

## 2020-12-09 NOTE — Telephone Encounter (Signed)
-----   Message from Volanda Napoleon, MD sent at 12/09/2020  6:34 AM EDT ----- Call he daughter -- the US shows a little bit of fluid in the belly, but not enough to draw off.  Laurey Arrow

## 2020-12-10 ENCOUNTER — Inpatient Hospital Stay: Payer: 59

## 2020-12-10 ENCOUNTER — Other Ambulatory Visit: Payer: Self-pay

## 2020-12-10 VITALS — BP 121/80 | HR 103 | Temp 98.9°F | Resp 18

## 2020-12-10 DIAGNOSIS — Z95828 Presence of other vascular implants and grafts: Secondary | ICD-10-CM

## 2020-12-10 DIAGNOSIS — Z5111 Encounter for antineoplastic chemotherapy: Secondary | ICD-10-CM | POA: Diagnosis not present

## 2020-12-10 DIAGNOSIS — C252 Malignant neoplasm of tail of pancreas: Secondary | ICD-10-CM

## 2020-12-10 MED ORDER — SODIUM CHLORIDE 0.9% FLUSH
10.0000 mL | Freq: Once | INTRAVENOUS | Status: AC
  Administered 2020-12-10: 10 mL via INTRAVENOUS
  Filled 2020-12-10: qty 10

## 2020-12-10 MED ORDER — HEPARIN SOD (PORK) LOCK FLUSH 100 UNIT/ML IV SOLN
500.0000 [IU] | Freq: Once | INTRAVENOUS | Status: AC
  Administered 2020-12-10: 500 [IU] via INTRAVENOUS
  Filled 2020-12-10: qty 5

## 2020-12-10 NOTE — Patient Instructions (Signed)

## 2020-12-14 MED FILL — GABAPENTIN 300 MG CAPSULE: 300 | 30 days supply | Qty: 120 | Fill #0

## 2020-12-17 ENCOUNTER — Encounter: Payer: Self-pay | Admitting: *Deleted

## 2020-12-17 ENCOUNTER — Other Ambulatory Visit: Payer: Self-pay | Admitting: *Deleted

## 2020-12-17 MED ORDER — FENTANYL 100 MCG/HR TD PT72: 1.0000 | MEDICATED_PATCH | TRANSDERMAL | 0 refills | Status: DC

## 2020-12-23 ENCOUNTER — Inpatient Hospital Stay (HOSPITAL_BASED_OUTPATIENT_CLINIC_OR_DEPARTMENT_OTHER): Payer: 59 | Admitting: Hematology & Oncology

## 2020-12-23 ENCOUNTER — Inpatient Hospital Stay: Payer: 59

## 2020-12-23 ENCOUNTER — Other Ambulatory Visit: Payer: Self-pay

## 2020-12-23 ENCOUNTER — Inpatient Hospital Stay: Payer: 59 | Attending: Hematology & Oncology

## 2020-12-23 ENCOUNTER — Encounter: Payer: Self-pay | Admitting: Hematology & Oncology

## 2020-12-23 ENCOUNTER — Other Ambulatory Visit (HOSPITAL_BASED_OUTPATIENT_CLINIC_OR_DEPARTMENT_OTHER): Payer: Self-pay

## 2020-12-23 ENCOUNTER — Encounter: Payer: Self-pay | Admitting: *Deleted

## 2020-12-23 VITALS — BP 139/64 | HR 105 | Temp 98.4°F | Resp 17

## 2020-12-23 DIAGNOSIS — G893 Neoplasm related pain (acute) (chronic): Secondary | ICD-10-CM | POA: Diagnosis not present

## 2020-12-23 DIAGNOSIS — Z5111 Encounter for antineoplastic chemotherapy: Secondary | ICD-10-CM | POA: Insufficient documentation

## 2020-12-23 DIAGNOSIS — C252 Malignant neoplasm of tail of pancreas: Secondary | ICD-10-CM | POA: Insufficient documentation

## 2020-12-23 DIAGNOSIS — C251 Malignant neoplasm of body of pancreas: Secondary | ICD-10-CM | POA: Diagnosis not present

## 2020-12-23 DIAGNOSIS — C779 Secondary and unspecified malignant neoplasm of lymph node, unspecified: Secondary | ICD-10-CM | POA: Diagnosis not present

## 2020-12-23 DIAGNOSIS — J910 Malignant pleural effusion: Secondary | ICD-10-CM | POA: Insufficient documentation

## 2020-12-23 DIAGNOSIS — Z79891 Long term (current) use of opiate analgesic: Secondary | ICD-10-CM | POA: Diagnosis not present

## 2020-12-23 DIAGNOSIS — R110 Nausea: Secondary | ICD-10-CM | POA: Insufficient documentation

## 2020-12-23 DIAGNOSIS — Z95828 Presence of other vascular implants and grafts: Secondary | ICD-10-CM

## 2020-12-23 LAB — CBC WITH DIFFERENTIAL (CANCER CENTER ONLY)
Abs Immature Granulocytes: 0.02 10*3/uL (ref 0.00–0.07)
Basophils Absolute: 0 10*3/uL (ref 0.0–0.1)
Basophils Relative: 0 %
Eosinophils Absolute: 0.2 10*3/uL (ref 0.0–0.5)
Eosinophils Relative: 5 %
HCT: 33.9 % — ABNORMAL LOW (ref 36.0–46.0)
Hemoglobin: 10.9 g/dL — ABNORMAL LOW (ref 12.0–15.0)
Immature Granulocytes: 0 %
Lymphocytes Relative: 26 %
Lymphs Abs: 1.2 10*3/uL (ref 0.7–4.0)
MCH: 27 pg (ref 26.0–34.0)
MCHC: 32.2 g/dL (ref 30.0–36.0)
MCV: 84.1 fL (ref 80.0–100.0)
Monocytes Absolute: 0.4 10*3/uL (ref 0.1–1.0)
Monocytes Relative: 9 %
Neutro Abs: 2.8 10*3/uL (ref 1.7–7.7)
Neutrophils Relative %: 60 %
Platelet Count: 248 10*3/uL (ref 150–400)
RBC: 4.03 MIL/uL (ref 3.87–5.11)
RDW: 15.4 % (ref 11.5–15.5)
WBC Count: 4.8 10*3/uL (ref 4.0–10.5)
nRBC: 0 % (ref 0.0–0.2)

## 2020-12-23 LAB — CMP (CANCER CENTER ONLY)
ALT: 12 U/L (ref 0–44)
AST: 14 U/L — ABNORMAL LOW (ref 15–41)
Albumin: 3.8 g/dL (ref 3.5–5.0)
Alkaline Phosphatase: 50 U/L (ref 38–126)
Anion gap: 7 (ref 5–15)
BUN: 11 mg/dL (ref 6–20)
CO2: 27 mmol/L (ref 22–32)
Calcium: 9.6 mg/dL (ref 8.9–10.3)
Chloride: 102 mmol/L (ref 98–111)
Creatinine: 0.56 mg/dL (ref 0.44–1.00)
GFR, Estimated: >60 mL/min (ref >60)
Glucose, Bld: 136 mg/dL — ABNORMAL HIGH (ref 70–99)
Potassium: 4.1 mmol/L (ref 3.5–5.1)
Sodium: 136 mmol/L (ref 135–145)
Total Bilirubin: 0.4 mg/dL (ref 0.3–1.2)
Total Protein: 7.2 g/dL (ref 6.5–8.1)

## 2020-12-23 LAB — CEA (IN HOUSE-CHCC): CEA (CHCC-In House): 29.03 ng/mL — ABNORMAL HIGH (ref 0.00–5.00)

## 2020-12-23 MED ORDER — ATROPINE SULFATE 1 MG/ML IJ SOLN
INTRAMUSCULAR | Status: AC
  Filled 2020-12-23: qty 1

## 2020-12-23 MED ORDER — SODIUM CHLORIDE 0.9 % IV SOLN
150.0000 mg | Freq: Once | INTRAVENOUS | Status: AC
  Administered 2020-12-23: 150 mg via INTRAVENOUS
  Filled 2020-12-23: qty 150

## 2020-12-23 MED ORDER — HYDROMORPHONE HCL 1 MG/ML IJ SOLN
2.0000 mg | Freq: Once | INTRAMUSCULAR | Status: AC
  Administered 2020-12-23: 2 mg via INTRAVENOUS

## 2020-12-23 MED ORDER — HYDROMORPHONE HCL 1 MG/ML IJ SOLN
INTRAMUSCULAR | Status: AC
  Filled 2020-12-23: qty 2

## 2020-12-23 MED ORDER — SODIUM CHLORIDE 0.9 % IV SOLN
10.0000 mg | Freq: Once | INTRAVENOUS | Status: AC
  Administered 2020-12-23: 10 mg via INTRAVENOUS
  Filled 2020-12-23: qty 10

## 2020-12-23 MED ORDER — OXALIPLATIN CHEMO INJECTION 100 MG/20ML
63.7500 mg/m2 | Freq: Once | INTRAVENOUS | Status: AC
  Administered 2020-12-23: 135 mg via INTRAVENOUS
  Filled 2020-12-23: qty 20

## 2020-12-23 MED ORDER — HYDROMORPHONE HCL 1 MG/ML IJ SOLN: 2.0000 mg | Freq: Once | INTRAMUSCULAR | Status: DC

## 2020-12-23 MED ORDER — SODIUM CHLORIDE 0.9 % IV SOLN
1920.0000 mg/m2 | INTRAVENOUS | Status: DC
  Administered 2020-12-23: 4000 mg via INTRAVENOUS
  Filled 2020-12-23: qty 80

## 2020-12-23 MED ORDER — SODIUM CHLORIDE 0.9% FLUSH
10.0000 mL | Freq: Once | INTRAVENOUS | Status: AC
  Administered 2020-12-23: 10 mL via INTRAVENOUS
  Filled 2020-12-23: qty 10

## 2020-12-23 MED ORDER — SODIUM CHLORIDE 0.9 % IV SOLN
400.0000 mg/m2 | Freq: Once | INTRAVENOUS | Status: AC
  Administered 2020-12-23: 832 mg via INTRAVENOUS
  Filled 2020-12-23: qty 25

## 2020-12-23 MED ORDER — ATROPINE SULFATE 1 MG/ML IJ SOLN
0.5000 mg | Freq: Once | INTRAMUSCULAR | Status: AC | PRN
  Administered 2020-12-23: 0.5 mg via INTRAVENOUS

## 2020-12-23 MED ORDER — DEXTROSE 5 % IV SOLN
Freq: Once | INTRAVENOUS | Status: AC
Start: 2020-12-23 — End: 2020-12-23
  Filled 2020-12-23: qty 250

## 2020-12-23 MED ORDER — METHADONE HCL 10 MG PO TABS
10.0000 mg | ORAL_TABLET | Freq: Three times a day (TID) | ORAL | 0 refills | Status: DC | PRN
  Filled 2020-12-23: qty 90, 30d supply, fill #0

## 2020-12-23 MED ORDER — PALONOSETRON HCL INJECTION 0.25 MG/5ML
0.2500 mg | Freq: Once | INTRAVENOUS | Status: AC
  Administered 2020-12-23: 0.25 mg via INTRAVENOUS

## 2020-12-23 MED ORDER — PALONOSETRON HCL INJECTION 0.25 MG/5ML
INTRAVENOUS | Status: AC
  Filled 2020-12-23: qty 5

## 2020-12-23 MED ORDER — SODIUM CHLORIDE 0.9 % IV SOLN
112.5000 mg/m2 | Freq: Once | INTRAVENOUS | Status: AC
  Administered 2020-12-23: 240 mg via INTRAVENOUS
  Filled 2020-12-23: qty 10

## 2020-12-23 MED ORDER — FENTANYL 100 MCG/HR TD PT72
1.0000 | MEDICATED_PATCH | TRANSDERMAL | 0 refills | Status: DC
  Filled 2020-12-23 – 2020-12-25 (×3): qty 15, 30d supply, fill #0

## 2020-12-23 NOTE — Patient Instructions (Signed)
Tunneled Central Venous Catheter Flushing Guide  It is important to flush your tunneled central venous catheter each time you use it, both before and after you use it. Flushing your catheter will help prevent it from clogging. What are the risks? Risks may include:  Infection.  Air getting into the catheter and bloodstream. Supplies needed:  A clean pair of gloves.  A disinfecting wipe. Use an alcohol wipe, chlorhexidine wipe, or iodine wipe as told by your health care provider.  A 10 mL syringe that has been prefilled with saline solution.  An empty 10 mL syringe, if a substance called heparin was injected into your catheter. How to flush your catheter When you flush your catheter, make sure you follow any specific instructions from your health care provider or the manufacturer. These are general guidelines. Flushing your catheter before use If there is heparin in your catheter: 1. Wash your hands with soap and water. 2. Put on gloves. 3. Scrub the injection cap for a minimum of 15 seconds with a disinfecting wipe. 4. Unclamp the catheter. 5. Attach the empty syringe to the injection cap. 6. Pull the syringe plunger back and withdraw 10 mL of blood. 7. Place the syringe into an appropriate waste container. 8. Scrub the injection cap for 15 seconds with a disinfecting wipe. 9. Attach the prefilled syringe to the injection cap. 10. Flush the catheter by pushing the plunger forward until all the liquid from the syringe is in the catheter. 11. Remove the syringe from the injection cap. 12. Clamp the catheter. If there is no heparin in your catheter: 1. Wash your hands with soap and water. 2. Put on gloves. 3. Scrub the injection cap for 15 seconds with a disinfecting wipe. 4. Unclamp the catheter. 5. Attach the prefilled syringe to the injection cap. 6. Flush the catheter by pushing the plunger forward until 5 mL of the liquid from the syringe is in the catheter. 7. Pull back on  the syringe until you see blood in the catheter. 8. If you have been asked to collect any blood, follow your health care provider's instructions. Otherwise, flush the catheter with the rest of the solution from the syringe. 9. Remove the syringe from the injection cap. 10. Clamp the catheter.   Flushing your catheter after use 1. Wash your hands with soap and water. 2. Put on gloves. 3. Scrub the injection cap for 15 seconds with a disinfecting wipe. 4. Unclamp the catheter. 5. Attach the prefilled syringe to the injection cap. 6. Flush the catheter by pushing the plunger forward until all of the liquid from the syringe is in the catheter. 7. Remove the syringe from the injection cap. 8. Clamp the catheter. Problems and solutions  If blood cannot be completely cleared from the injection cap, you may need to have the injection cap replaced.  If the catheter is difficult to flush, use the pulsing method. The pulsing method involves pushing only a few milliliters of solution into the catheter at a time and pausing between pushes.  If you do not see blood in the catheter when you pull back on the syringe, change your body position, such as by raising your arms above your head. Take a deep breath and cough. Then, pull back on the syringe. If you still do not see blood, flush the catheter with a small amount of solution. Then, change positions again and take a breath or cough. Pull back on the syringe again. If you still do not   see blood, finish flushing the catheter and contact your health care provider. Do not use your catheter until your health care provider says it is okay. General tips  Have someone help you flush your catheter, if possible.  Do not force fluid through your catheter.  Do not use a syringe that is larger or smaller than 10 mL. Using a smaller syringe can make the catheter burst.  Do not use your catheter without flushing it first if it has heparin in it. Contact a health  care provider if:  You cannot see any blood in the catheter when you flush it before using it.  Your catheter is difficult to flush. Get help right away if:  You cannot flush the catheter.  The catheter leaks when you flush it or when there is fluid in it.  There are cracks or breaks in the catheter. Summary  It is important to flush your tunneled central venous catheter each time you use it, both before and after you use it.  Scrub the injection cap for 15 seconds with a disinfecting wipe before and after you flush it.  When you flush your catheter, make sure you follow any specific instructions from your health care provider or the manufacturer.  Get help right away if you cannot flush the catheter. This information is not intended to replace advice given to you by your health care provider. Make sure you discuss any questions you have with your health care provider. Document Revised: 11/14/2019 Document Reviewed: 11/21/2018 Elsevier Patient Education  2021 [employer] Inc.  

## 2020-12-23 NOTE — Progress Notes (Signed)
Okay to treat with HR 105 per Dr. Marin Olp.

## 2020-12-23 NOTE — Progress Notes (Signed)
Hematology and Oncology Follow Up Visit  Angela Wilson [id number] 07/20/1966 55 y.o. 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  Unfortunately, she is still having problems with lower abdominal pain.  Again, this is clearly from the malignancy.  We increased her Duragesic patch up to 100 mcg.  She will put this on every other day.  I will see if methadone might help her out.  Sometimes this can sometimes help with pain which is somewhat neuropathic in nature.  She is having nausea.  She is not throwing up.  She is going to the bathroom.  She is not having constipation.  We did get an abdominal ultrasound on her.  There really is not much in the way of fluid that we can take out.  Her CEA and CA-125 have both been elevated.  This usually is a good indicator as to whether or not she is responding.  I know that she is trying hard.  Her family is doing a great job with her.  I am very impressed and not surprised at all by the support that she has by her family.  I just wish that we can get this abdominal pain under better control for her.  Overall, her performance status is by ECOG 2.    Medications:  Current Outpatient Medications:  .  amLODipine (NORVASC) 5 MG tablet, Take 1 tablet (5 mg total) by mouth daily., Disp: 90 tablet, Rfl: 3 .  cetirizine (ZYRTEC) 10 MG tablet, Take 1 tablet (10 mg total) by mouth daily., Disp: 30 tablet, Rfl: 3 .  dronabinol (MARINOL) 2.5 MG capsule, Take 1 capsule (2.5 mg total) by mouth 2 (two) times daily before lunch and supper., Disp: 60 capsule, Rfl: 0 .  ergocalciferol (VITAMIN D2) 1.25 MG (50000 UT) capsule, Take 1 capsule (50,000 Units total) by mouth  once a week., Disp: 6 capsule, Rfl: 3 .  gabapentin (NEURONTIN) 300 MG capsule, TAKE 2 CAPSULES BY MOUTH IN THE DAYTIME AND 2 CAPSULES AT BEDTIME, Disp: 120 capsule, Rfl: 3 .  glipiZIDE (GLUCOTROL) 10 MG tablet, Take 1 tablet (10 mg total) by mouth 2 (two) times daily., Disp: 60 tablet, Rfl: 1 .  HYDROmorphone (DILAUDID) 2 MG tablet, TAKE 1/2 TABLET (1 MG TOTAL) BY MOUTH EVERY 4 (FOUR) HOURS AS NEEDED FOR SEVERE PAIN., Disp: 90 tablet, Rfl: 0 .  lidocaine-prilocaine (EMLA) cream, APPLY TO PORT 1 HOUR BEFORE ACCESS., Disp: 30 g, Rfl: 3 .  lisinopril (ZESTRIL) 10 MG tablet, Take 1 tablet (10 mg total) by mouth daily., Disp: 90 tablet, Rfl: 1 .  metFORMIN (GLUCOPHAGE XR) 500 MG 24 hr tablet, Take 1 tablet (500 mg total) by mouth daily with breakfast., Disp: 90 tablet, Rfl: 1 .  omeprazole (PRILOSEC) 40 MG capsule, Take 1 capsule (40 mg total) by mouth daily., Disp: 90 capsule, Rfl: 2 .  PB-Hyoscy-Atropine-Scopolamine (PHENOBARBITAL-BELLADONNA ALK) 16.2 MG/5ML ELIX, TAKE 5 MLS (16.2 MG TOTAL) BY MOUTH 4 (FOUR) TIMES DAILY., Disp: 600 mL, Rfl: 2 .  prochlorperazine (COMPAZINE) 10 MG tablet, Take 1 tablet (10 mg total) by mouth every 6 (six) hours as needed for nausea or vomiting., Disp: 90 tablet, Rfl: 3 .  Pyridoxine HCl (VITAMIN  B-6) 250 MG tablet, Take 1 tablet (250 mg total) by mouth daily., Disp: 30 tablet, Rfl: 6 .  albuterol (PROVENTIL HFA;VENTOLIN HFA) 108 (90 Base) MCG/ACT inhaler, Inhale 2 puffs into the lungs every 4 (four) hours as needed for wheezing or shortness of breath. (Patient not taking: No sig reported), Disp: 1 Inhaler, Rfl: 0 .  aspirin 81 MG chewable tablet, Chew 81 mg by mouth daily.  (Patient not taking: No sig reported), Disp: , Rfl:  .  CREON 36000-114000 units CPEP capsule, , Disp: , Rfl:  .  fentaNYL (DURAGESIC) 100 MCG/HR, Place 1 patch onto the skin every other day., Disp: 15 patch, Rfl: 0 .  HYDROcodone-homatropine (HYCODAN) 5-1.5 MG/5ML syrup, TAKE 5 MLS BY MOUTH EVERY 6  (SIX) HOURS AS NEEDED FOR COUGH. (Patient not taking: Reported on 12/23/2020), Disp: 120 mL, Rfl: 0 .  loperamide (IMODIUM A-D) 2 MG tablet, Take 2 at onset of diarrhea, then 1 every 2hrs until 12hr without a BM. May take 2 tab every 4hrs at bedtime. If diarrhea recurs repeat. (Patient not taking: Reported on 12/23/2020), Disp: 100 tablet, Rfl: 1 .  magic mouthwash w/lidocaine SOLN, Take 5 mLs by mouth 3 (three) times daily as needed for mouth pain. (Patient not taking: Reported on 12/23/2020), Disp: 450 mL, Rfl: 1 .  ondansetron (ZOFRAN) 4 MG tablet, Take 1 tablet (4 mg total) by mouth every 6 (six) hours as needed for nausea. (Patient not taking: Reported on 12/23/2020), Disp: 20 tablet, Rfl: 0 .  polyethylene glycol (MIRALAX / GLYCOLAX) 17 g packet, Take 17 g by mouth daily as needed. (Patient not taking: Reported on 12/23/2020), Disp: 14 each, Rfl: 0 No current facility-administered medications for this visit.  Facility-Administered Medications Ordered in Other Visits:  .  fluorouracil (ADRUCIL) 4,000 mg in sodium chloride 0.9 % 70 mL chemo infusion, 1,920 mg/m2 (Treatment Plan Recorded), Intravenous, 1 day or 1 dose, Juda Toepfer, Rudell Cobb, MD, 4,000 mg at 12/23/20 1520  Allergies:  Allergies  Allergen Reactions  . Iodinated Diagnostic Agents Itching and Rash  . Ivp Dye [Iodinated Diagnostic Agents] Itching and Rash  . Morphine And Related Hives  . Penicillins Rash    Past Medical History, Surgical history, Social history, and Family History were reviewed and updated.  Review of Systems: Review of Systems  Constitutional: Negative.   HENT:  Negative.   Eyes: Negative.   Respiratory: Negative.   Cardiovascular: Negative.   Gastrointestinal: Positive for abdominal pain and nausea.  Endocrine: Negative.   Genitourinary: Negative.    Musculoskeletal: Negative.   Skin: Negative.   Neurological: Negative.   Hematological: Negative.   Psychiatric/Behavioral: Negative.     Physical Exam:  weight  is 207 lb (93.9 kg) (pended). Her oral temperature is 98.4 F (36.9 C). Her blood pressure is 139/64 and her pulse is 105 (abnormal). Her respiration is 17 and oxygen saturation is 99%.   Wt Readings from Last 3 Encounters:  12/23/20 (P) 207 lb (93.9 kg)  12/08/20 208 lb 8 oz (94.6 kg)  11/25/20 211 lb (95.7 kg)   Her vital signs show a temperature of 97.1.  Pulse is 100.  Blood pressure 116/70.  Weight is 230 pounds.  Physical Exam Vitals reviewed.  HENT:     Head: Normocephalic and atraumatic.  Eyes:     Pupils: Pupils are equal, round, and reactive to light.  Cardiovascular:     Rate and Rhythm: Normal rate and regular rhythm.     Heart sounds: Normal heart sounds.  Pulmonary:     Effort: Pulmonary effort is normal.     Breath sounds: Normal breath sounds.  Abdominal:     General: Bowel sounds are normal.     Palpations: Abdomen is soft.  Musculoskeletal:        General: No tenderness or deformity. Normal range of motion.     Cervical back: Normal range of motion.  Lymphadenopathy:     Cervical: No cervical adenopathy.  Skin:    General: Skin is warm and dry.     Findings: No erythema or rash.  Neurological:     Mental Status: She is alert and oriented to person, place, and time.  Psychiatric:        Behavior: Behavior normal.        Thought Content: Thought content normal.        Judgment: Judgment normal.      Lab Results  Component Value Date   WBC 4.8 12/23/2020   HGB 10.9 (L) 12/23/2020   HCT 33.9 (L) 12/23/2020   MCV 84.1 12/23/2020   PLT 248 12/23/2020     Chemistry      Component Value Date/Time   NA 136 12/23/2020 0818   NA 136 01/07/2020 1256   K 4.1 12/23/2020 0818   CL 102 12/23/2020 0818   CO2 27 12/23/2020 0818   BUN 11 12/23/2020 0818   BUN 10 01/07/2020 1256   CREATININE 0.56 12/23/2020 0818      Component Value Date/Time   CALCIUM 9.6 12/23/2020 0818   ALKPHOS 50 12/23/2020 0818   AST 14 (L) 12/23/2020 0818   ALT 12 12/23/2020  0818   BILITOT 0.4 12/23/2020 0818       Impression and Plan: Angela Wilson is a very nice 55 year old woman from [place].  She has metastatic pancreatic cancer.  She has been on treatment now for about 8 months.  She was off treatment for about 6 weeks because she went over to [place] to see a new grandchild.  I wanted to make sure that she could make it over there as it was incredibly important to her to see this grandchild.  We will go ahead with her third cycle of FOLFIRINOX.  Hopefully, we will find that her tumor markers are coming down.  Maybe the increase in the Duragesic patch and possibly methadone might be able to help a little bit.  Again, we are clearly dealing with quality of life issues.  I does want her quality of life to be better.  We will still plan for another follow-up in 2 weeks.    I am very humble by the fact that she gave me framed pictures of 2 camels that she took pictures of when she was in [place].  I will certainly cherish these.    Volanda Napoleon, MD 4/6/20225:50 PM

## 2020-12-24 ENCOUNTER — Encounter: Payer: Self-pay | Admitting: *Deleted

## 2020-12-24 ENCOUNTER — Other Ambulatory Visit (HOSPITAL_BASED_OUTPATIENT_CLINIC_OR_DEPARTMENT_OTHER): Payer: Self-pay

## 2020-12-24 ENCOUNTER — Telehealth: Payer: Self-pay | Admitting: Hematology & Oncology

## 2020-12-24 LAB — CA 125: Cancer Antigen (CA) 125: 316 U/mL — ABNORMAL HIGH (ref 0.0–38.1)

## 2020-12-24 NOTE — Progress Notes (Signed)
Oncology Nurse Navigator Documentation  Oncology Nurse Navigator Flowsheets 12/23/2020  Confirmed Diagnosis Date -  Diagnosis Status -  Planned Course of Treatment -  Phase of Treatment -  Chemotherapy Pending- Reason: -  Chemotherapy Actual Start Date: -  Navigator Follow Up Date: 01/06/2021  Navigator Follow Up Reason: Follow-up Appointment;Chemotherapy  Navigator Location [facility]  Referral Date to RadOnc/MedOnc -  Navigator Encounter Type Treatment;Appt/Treatment Plan Review  Telephone -  Treatment Initiated Date -  Patient Visit Type MedOnc  Treatment Phase Active Tx  Barriers/Navigation Needs Coordination of Care;Cultural Needs;Family Concerns;Language/Communication;Education  Education -  Interventions Psycho-Social Support  Acuity Level 2-Minimal Needs (1-2 Barriers Identified)  Referrals -  Coordination of Care -  Education Method -  Support Groups/Services Friends and Family  Time Spent with Patient 30

## 2020-12-25 ENCOUNTER — Inpatient Hospital Stay: Payer: 59

## 2020-12-25 ENCOUNTER — Other Ambulatory Visit (HOSPITAL_BASED_OUTPATIENT_CLINIC_OR_DEPARTMENT_OTHER): Payer: Self-pay

## 2020-12-25 ENCOUNTER — Other Ambulatory Visit: Payer: Self-pay

## 2020-12-25 VITALS — BP 108/57 | HR 69

## 2020-12-25 DIAGNOSIS — C252 Malignant neoplasm of tail of pancreas: Secondary | ICD-10-CM

## 2020-12-25 DIAGNOSIS — Z5111 Encounter for antineoplastic chemotherapy: Secondary | ICD-10-CM | POA: Diagnosis not present

## 2020-12-25 MED ORDER — HEPARIN SOD (PORK) LOCK FLUSH 100 UNIT/ML IV SOLN
500.0000 [IU] | Freq: Once | INTRAVENOUS | Status: AC | PRN
  Administered 2020-12-25: 500 [IU]
  Filled 2020-12-25: qty 5

## 2020-12-25 MED ORDER — SODIUM CHLORIDE 0.9% FLUSH
10.0000 mL | INTRAVENOUS | Status: DC | PRN
  Administered 2020-12-25: 10 mL
  Filled 2020-12-25: qty 10

## 2020-12-31 ENCOUNTER — Ambulatory Visit: Payer: Self-pay | Admitting: Family Medicine

## 2021-01-06 ENCOUNTER — Other Ambulatory Visit: Payer: Self-pay

## 2021-01-06 ENCOUNTER — Other Ambulatory Visit (HOSPITAL_BASED_OUTPATIENT_CLINIC_OR_DEPARTMENT_OTHER): Payer: Self-pay

## 2021-01-06 ENCOUNTER — Encounter: Payer: Self-pay | Admitting: Hematology & Oncology

## 2021-01-06 ENCOUNTER — Telehealth: Payer: Self-pay

## 2021-01-06 ENCOUNTER — Inpatient Hospital Stay (HOSPITAL_BASED_OUTPATIENT_CLINIC_OR_DEPARTMENT_OTHER): Payer: 59 | Admitting: Hematology & Oncology

## 2021-01-06 ENCOUNTER — Inpatient Hospital Stay: Payer: 59

## 2021-01-06 VITALS — BP 117/63 | HR 95 | Temp 98.5°F | Resp 18 | Wt 205.0 lb

## 2021-01-06 DIAGNOSIS — C251 Malignant neoplasm of body of pancreas: Secondary | ICD-10-CM

## 2021-01-06 DIAGNOSIS — C252 Malignant neoplasm of tail of pancreas: Secondary | ICD-10-CM

## 2021-01-06 DIAGNOSIS — Z5111 Encounter for antineoplastic chemotherapy: Secondary | ICD-10-CM | POA: Diagnosis not present

## 2021-01-06 LAB — CBC WITH DIFFERENTIAL (CANCER CENTER ONLY)
Abs Immature Granulocytes: 0.01 10*3/uL (ref 0.00–0.07)
Basophils Absolute: 0 10*3/uL (ref 0.0–0.1)
Basophils Relative: 0 %
Eosinophils Absolute: 0.1 10*3/uL (ref 0.0–0.5)
Eosinophils Relative: 3 %
HCT: 34.6 % — ABNORMAL LOW (ref 36.0–46.0)
Hemoglobin: 10.9 g/dL — ABNORMAL LOW (ref 12.0–15.0)
Immature Granulocytes: 0 %
Lymphocytes Relative: 30 %
Lymphs Abs: 1.4 10*3/uL (ref 0.7–4.0)
MCH: 26.8 pg (ref 26.0–34.0)
MCHC: 31.5 g/dL (ref 30.0–36.0)
MCV: 85.2 fL (ref 80.0–100.0)
Monocytes Absolute: 0.5 10*3/uL (ref 0.1–1.0)
Monocytes Relative: 9 %
Neutro Abs: 2.7 10*3/uL (ref 1.7–7.7)
Neutrophils Relative %: 58 %
Platelet Count: 190 10*3/uL (ref 150–400)
RBC: 4.06 MIL/uL (ref 3.87–5.11)
RDW: 15.2 % (ref 11.5–15.5)
WBC Count: 4.8 10*3/uL (ref 4.0–10.5)
nRBC: 0 % (ref 0.0–0.2)

## 2021-01-06 LAB — CMP (CANCER CENTER ONLY)
ALT: 12 U/L (ref 0–44)
AST: 14 U/L — ABNORMAL LOW (ref 15–41)
Albumin: 3.7 g/dL (ref 3.5–5.0)
Alkaline Phosphatase: 50 U/L (ref 38–126)
Anion gap: 6 (ref 5–15)
BUN: 13 mg/dL (ref 6–20)
CO2: 27 mmol/L (ref 22–32)
Calcium: 9.4 mg/dL (ref 8.9–10.3)
Chloride: 100 mmol/L (ref 98–111)
Creatinine: 0.59 mg/dL (ref 0.44–1.00)
GFR, Estimated: >60 mL/min (ref >60)
Glucose, Bld: 132 mg/dL — ABNORMAL HIGH (ref 70–99)
Potassium: 4.3 mmol/L (ref 3.5–5.1)
Sodium: 133 mmol/L — ABNORMAL LOW (ref 135–145)
Total Bilirubin: 0.3 mg/dL (ref 0.3–1.2)
Total Protein: 7.3 g/dL (ref 6.5–8.1)

## 2021-01-06 MED ORDER — SODIUM CHLORIDE 0.9 % IV SOLN
10.0000 mg | Freq: Once | INTRAVENOUS | Status: AC
  Administered 2021-01-06: 10 mg via INTRAVENOUS
  Filled 2021-01-06: qty 10

## 2021-01-06 MED ORDER — SODIUM CHLORIDE 0.9 % IV SOLN
112.5000 mg/m2 | Freq: Once | INTRAVENOUS | Status: AC
  Administered 2021-01-06: 240 mg via INTRAVENOUS
  Filled 2021-01-06: qty 10

## 2021-01-06 MED ORDER — SODIUM CHLORIDE 0.9 % IV SOLN
400.0000 mg/m2 | Freq: Once | INTRAVENOUS | Status: AC
  Administered 2021-01-06: 832 mg via INTRAVENOUS
  Filled 2021-01-06: qty 25

## 2021-01-06 MED ORDER — OXYCODONE HCL 5 MG PO TABS
5.0000 mg | ORAL_TABLET | ORAL | 0 refills | Status: DC | PRN
  Filled 2021-01-06: qty 90, 15d supply, fill #0

## 2021-01-06 MED ORDER — DEXTROSE 5 % IV SOLN
Freq: Once | INTRAVENOUS | Status: AC
  Filled 2021-01-06: qty 250

## 2021-01-06 MED ORDER — OXALIPLATIN CHEMO INJECTION 100 MG/20ML
63.7500 mg/m2 | Freq: Once | INTRAVENOUS | Status: AC
  Administered 2021-01-06: 135 mg via INTRAVENOUS
  Filled 2021-01-06: qty 20

## 2021-01-06 MED ORDER — SODIUM CHLORIDE 0.9 % IV SOLN
150.0000 mg | Freq: Once | INTRAVENOUS | Status: AC
  Administered 2021-01-06: 150 mg via INTRAVENOUS
  Filled 2021-01-06: qty 150

## 2021-01-06 MED ORDER — PALONOSETRON HCL INJECTION 0.25 MG/5ML
INTRAVENOUS | Status: AC
  Filled 2021-01-06: qty 5

## 2021-01-06 MED ORDER — HEPARIN SOD (PORK) LOCK FLUSH 100 UNIT/ML IV SOLN
500.0000 [IU] | Freq: Once | INTRAVENOUS | Status: DC | PRN
  Filled 2021-01-06: qty 5

## 2021-01-06 MED ORDER — PALONOSETRON HCL INJECTION 0.25 MG/5ML
0.2500 mg | Freq: Once | INTRAVENOUS | Status: AC
  Administered 2021-01-06: 0.25 mg via INTRAVENOUS

## 2021-01-06 MED ORDER — ATROPINE SULFATE 1 MG/ML IJ SOLN
INTRAMUSCULAR | Status: AC
  Filled 2021-01-06: qty 1

## 2021-01-06 MED ORDER — HYDROMORPHONE HCL 1 MG/ML IJ SOLN
INTRAMUSCULAR | Status: AC
  Filled 2021-01-06: qty 2

## 2021-01-06 MED ORDER — SODIUM CHLORIDE 0.9 % IV SOLN
1920.0000 mg/m2 | INTRAVENOUS | Status: DC
  Administered 2021-01-06: 4000 mg via INTRAVENOUS
  Filled 2021-01-06: qty 80

## 2021-01-06 MED ORDER — ATROPINE SULFATE 1 MG/ML IJ SOLN
0.5000 mg | Freq: Once | INTRAMUSCULAR | Status: AC | PRN
  Administered 2021-01-06: 0.5 mg via INTRAVENOUS

## 2021-01-06 MED ORDER — FENTANYL 75 MCG/HR TD PT72
1.0000 | MEDICATED_PATCH | TRANSDERMAL | 0 refills | Status: DC
  Filled 2021-01-06: qty 15, 30d supply, fill #0

## 2021-01-06 MED ORDER — SODIUM CHLORIDE 0.9% FLUSH
10.0000 mL | INTRAVENOUS | Status: DC | PRN
  Filled 2021-01-06: qty 10

## 2021-01-06 MED ORDER — HYDROMORPHONE HCL 1 MG/ML IJ SOLN
2.0000 mg | Freq: Once | INTRAMUSCULAR | Status: AC
  Administered 2021-01-06: 2 mg via INTRAVENOUS

## 2021-01-06 MED ORDER — DEXTROSE 5 % IV SOLN
Freq: Once | INTRAVENOUS | Status: AC
Start: 2021-01-06 — End: 2021-01-06
  Filled 2021-01-06: qty 250

## 2021-01-06 NOTE — Patient Instructions (Signed)
Oxaliplatin Injection What is this medicine? OXALIPLATIN (ox AL i PLA tin) is a chemotherapy drug. It targets fast dividing cells, like cancer cells, and causes these cells to die. This medicine is used to treat cancers of the colon and rectum, and many other cancers. This medicine may be used for other purposes; ask your health care provider or pharmacist if you have questions. COMMON BRAND NAME(S): Eloxatin What should I tell my health care provider before I take this medicine? They need to know if you have any of these conditions:  heart disease  history of irregular heartbeat  liver disease  low blood counts, like white cells, platelets, or red blood cells  lung or breathing disease, like asthma  take medicines that treat or prevent blood clots  tingling of the fingers or toes, or other nerve disorder  an unusual or allergic reaction to oxaliplatin, other chemotherapy, other medicines, foods, dyes, or preservatives  pregnant or trying to get pregnant  breast-feeding How should I use this medicine? This drug is given as an infusion into a vein. It is administered in a hospital or clinic by a specially trained health care professional. Talk to your pediatrician regarding the use of this medicine in children. Special care may be needed. Overdosage: If you think you have taken too much of this medicine contact a poison control center or emergency room at once. NOTE: This medicine is only for you. Do not share this medicine with others. What if I miss a dose? It is important not to miss a dose. Call your doctor or health care professional if you are unable to keep an appointment. What may interact with this medicine? Do not take this medicine with any of the following medications:  cisapride  dronedarone  pimozide  thioridazine This medicine may also interact with the following medications:  aspirin and aspirin-like medicines  certain medicines that treat or prevent  blood clots like warfarin, apixaban, dabigatran, and rivaroxaban  cisplatin  cyclosporine  diuretics  medicines for infection like acyclovir, adefovir, amphotericin B, bacitracin, cidofovir, foscarnet, ganciclovir, gentamicin, pentamidine, vancomycin  NSAIDs, medicines for pain and inflammation, like ibuprofen or naproxen  other medicines that prolong the QT interval (an abnormal heart rhythm)  pamidronate  zoledronic acid This list may not describe all possible interactions. Give your health care provider a list of all the medicines, herbs, non-prescription drugs, or dietary supplements you use. Also tell them if you smoke, drink alcohol, or use illegal drugs. Some items may interact with your medicine. What should I watch for while using this medicine? Your condition will be monitored carefully while you are receiving this medicine. You may need blood work done while you are taking this medicine. This medicine may make you feel generally unwell. This is not uncommon as chemotherapy can affect healthy cells as well as cancer cells. Report any side effects. Continue your course of treatment even though you feel ill unless your healthcare professional tells you to stop. This medicine can make you more sensitive to cold. Do not drink cold drinks or use ice. Cover exposed skin before coming in contact with cold temperatures or cold objects. When out in cold weather wear warm clothing and cover your mouth and nose to warm the air that goes into your lungs. Tell your doctor if you get sensitive to the cold. Do not become pregnant while taking this medicine or for 9 months after stopping it. Women should inform their health care professional if they wish to become   pregnant or think they might be pregnant. Men should not father a child while taking this medicine and for 6 months after stopping it. There is potential for serious side effects to an unborn child. Talk to your health care professional  for more information. Do not breast-feed a child while taking this medicine or for 3 months after stopping it. This medicine has caused ovarian failure in some women. This medicine may make it more difficult to get pregnant. Talk to your health care professional if you are concerned about your fertility. This medicine has caused decreased sperm counts in some men. This may make it more difficult to father a child. Talk to your health care professional if you are concerned about your fertility. This medicine may increase your risk of getting an infection. Call your health care professional for advice if you get a fever, chills, or sore throat, or other symptoms of a cold or flu. Do not treat yourself. Try to avoid being around people who are sick. Avoid taking medicines that contain aspirin, acetaminophen, ibuprofen, naproxen, or ketoprofen unless instructed by your health care professional. These medicines may hide a fever. Be careful brushing or flossing your teeth or using a toothpick because you may get an infection or bleed more easily. If you have any dental work done, tell your dentist you are receiving this medicine. What side effects may I notice from receiving this medicine? Side effects that you should report to your doctor or health care professional as soon as possible:  allergic reactions like skin rash, itching or hives, swelling of the face, lips, or tongue  breathing problems  cough  low blood counts - this medicine may decrease the number of white blood cells, red blood cells, and platelets. You may be at increased risk for infections and bleeding  nausea, vomiting  pain, redness, or irritation at site where injected  pain, tingling, numbness in the hands or feet  signs and symptoms of bleeding such as bloody or black, tarry stools; red or dark brown urine; spitting up blood or brown material that looks like coffee grounds; red spots on the skin; unusual bruising or bleeding  from the eyes, gums, or nose  signs and symptoms of a dangerous change in heartbeat or heart rhythm like chest pain; dizziness; fast, irregular heartbeat; palpitations; feeling faint or lightheaded; falls  signs and symptoms of infection like fever; chills; cough; sore throat; pain or trouble passing urine  signs and symptoms of liver injury like dark yellow or brown urine; general ill feeling or flu-like symptoms; light-colored stools; loss of appetite; nausea; right upper belly pain; unusually weak or tired; yellowing of the eyes or skin  signs and symptoms of low red blood cells or anemia such as unusually weak or tired; feeling faint or lightheaded; falls  signs and symptoms of muscle injury like dark urine; trouble passing urine or change in the amount of urine; unusually weak or tired; muscle pain; back pain Side effects that usually do not require medical attention (report to your doctor or health care professional if they continue or are bothersome):  changes in taste  diarrhea  gas  hair loss  loss of appetite  mouth sores This list may not describe all possible side effects. Call your doctor for medical advice about side effects. You may report side effects to FDA at 1-800-FDA-1088. Where should I keep my medicine? This drug is given in a hospital or clinic and will not be stored at home. NOTE:   This sheet is a summary. It may not cover all possible information. If you have questions about this medicine, talk to your doctor, pharmacist, or health care provider.  2021 [employer] Standard (2019-01-23 12:20:35) Fluorouracil, 5-FU injection What is this medicine? FLUOROURACIL, 5-FU (flure oh YOOR a sil) is a chemotherapy drug. It slows the growth of cancer cells. This medicine is used to treat many types of cancer like breast cancer, colon or rectal cancer, pancreatic cancer, and stomach cancer. This medicine may be used for other purposes; ask your health care provider or  pharmacist if you have questions. COMMON BRAND NAME(S): Adrucil What should I tell my health care provider before I take this medicine? They need to know if you have any of these conditions:  blood disorders  dihydropyrimidine dehydrogenase (DPD) deficiency  infection (especially a virus infection such as chickenpox, cold sores, or herpes)  kidney disease  liver disease  malnourished, poor nutrition  recent or ongoing radiation therapy  an unusual or allergic reaction to fluorouracil, other chemotherapy, other medicines, foods, dyes, or preservatives  pregnant or trying to get pregnant  breast-feeding How should I use this medicine? This drug is given as an infusion or injection into a vein. It is administered in a hospital or clinic by a specially trained health care professional. Talk to your pediatrician regarding the use of this medicine in children. Special care may be needed. Overdosage: If you think you have taken too much of this medicine contact a poison control center or emergency room at once. NOTE: This medicine is only for you. Do not share this medicine with others. What if I miss a dose? It is important not to miss your dose. Call your doctor or health care professional if you are unable to keep an appointment. What may interact with this medicine? Do not take this medicine with any of the following medications:  live virus vaccines This medicine may also interact with the following medications:  medicines that treat or prevent blood clots like warfarin, enoxaparin, and dalteparin This list may not describe all possible interactions. Give your health care provider a list of all the medicines, herbs, non-prescription drugs, or dietary supplements you use. Also tell them if you smoke, drink alcohol, or use illegal drugs. Some items may interact with your medicine. What should I watch for while using this medicine? Visit your doctor for checks on your progress.  This drug may make you feel generally unwell. This is not uncommon, as chemotherapy can affect healthy cells as well as cancer cells. Report any side effects. Continue your course of treatment even though you feel ill unless your doctor tells you to stop. In some cases, you may be given additional medicines to help with side effects. Follow all directions for their use. Call your doctor or health care professional for advice if you get a fever, chills or sore throat, or other symptoms of a cold or flu. Do not treat yourself. This drug decreases your body's ability to fight infections. Try to avoid being around people who are sick. This medicine may increase your risk to bruise or bleed. Call your doctor or health care professional if you notice any unusual bleeding. Be careful brushing and flossing your teeth or using a toothpick because you may get an infection or bleed more easily. If you have any dental work done, tell your dentist you are receiving this medicine. Avoid taking products that contain aspirin, acetaminophen, ibuprofen, naproxen, or ketoprofen unless instructed by your doctor.  These medicines may hide a fever. Do not become pregnant while taking this medicine. Women should inform their doctor if they wish to become pregnant or think they might be pregnant. There is a potential for serious side effects to an unborn child. Talk to your health care professional or pharmacist for more information. Do not breast-feed an infant while taking this medicine. Men should inform their doctor if they wish to father a child. This medicine may lower sperm counts. Do not treat diarrhea with over the counter products. Contact your doctor if you have diarrhea that lasts more than 2 days or if it is severe and watery. This medicine can make you more sensitive to the sun. Keep out of the sun. If you cannot avoid being in the sun, wear protective clothing and use sunscreen. Do not use sun lamps or tanning  beds/booths. What side effects may I notice from receiving this medicine? Side effects that you should report to your doctor or health care professional as soon as possible:  allergic reactions like skin rash, itching or hives, swelling of the face, lips, or tongue  low blood counts - this medicine may decrease the number of white blood cells, red blood cells and platelets. You may be at increased risk for infections and bleeding.  signs of infection - fever or chills, cough, sore throat, pain or difficulty passing urine  signs of decreased platelets or bleeding - bruising, pinpoint red spots on the skin, black, tarry stools, blood in the urine  signs of decreased red blood cells - unusually weak or tired, fainting spells, lightheadedness  breathing problems  changes in vision  chest pain  mouth sores  nausea and vomiting  pain, swelling, redness at site where injected  pain, tingling, numbness in the hands or feet  redness, swelling, or sores on hands or feet  stomach pain  unusual bleeding Side effects that usually do not require medical attention (report to your doctor or health care professional if they continue or are bothersome):  changes in finger or toe nails  diarrhea  dry or itchy skin  hair loss  headache  loss of appetite  sensitivity of eyes to the light  stomach upset  unusually teary eyes This list may not describe all possible side effects. Call your doctor for medical advice about side effects. You may report side effects to FDA at 1-800-FDA-1088. Where should I keep my medicine? This drug is given in a hospital or clinic and will not be stored at home. NOTE: This sheet is a summary. It may not cover all possible information. If you have questions about this medicine, talk to your doctor, pharmacist, or health care provider.  2021 [employer] Standard (2019-08-06 15:00:03) Leucovorin injection What is this medicine? LEUCOVORIN (loo koe  VOR in) is used to prevent or treat the harmful effects of some medicines. This medicine is used to treat anemia caused by a low amount of folic acid in the body. It is also used with 5-fluorouracil (5-FU) to treat colon cancer. This medicine may be used for other purposes; ask your health care provider or pharmacist if you have questions. What should I tell my health care provider before I take this medicine? They need to know if you have any of these conditions:  anemia from low levels of vitamin B-12 in the blood  an unusual or allergic reaction to leucovorin, folic acid, other medicines, foods, dyes, or preservatives  pregnant or trying to get pregnant  breast-feeding How  should I use this medicine? This medicine is for injection into a muscle or into a vein. It is given by a health care professional in a hospital or clinic setting. Talk to your pediatrician regarding the use of this medicine in children. Special care may be needed. Overdosage: If you think you have taken too much of this medicine contact a poison control center or emergency room at once. NOTE: This medicine is only for you. Do not share this medicine with others. What if I miss a dose? This does not apply. What may interact with this medicine?  capecitabine  fluorouracil  phenobarbital  phenytoin  primidone  trimethoprim-sulfamethoxazole This list may not describe all possible interactions. Give your health care provider a list of all the medicines, herbs, non-prescription drugs, or dietary supplements you use. Also tell them if you smoke, drink alcohol, or use illegal drugs. Some items may interact with your medicine. What should I watch for while using this medicine? Your condition will be monitored carefully while you are receiving this medicine. This medicine may increase the side effects of 5-fluorouracil, 5-FU. Tell your doctor or health care professional if you have diarrhea or mouth sores that do not  get better or that get worse. What side effects may I notice from receiving this medicine? Side effects that you should report to your doctor or health care professional as soon as possible:  allergic reactions like skin rash, itching or hives, swelling of the face, lips, or tongue  breathing problems  fever, infection  mouth sores  unusual bleeding or bruising  unusually weak or tired Side effects that usually do not require medical attention (report to your doctor or health care professional if they continue or are bothersome):  constipation or diarrhea  loss of appetite  nausea, vomiting This list may not describe all possible side effects. Call your doctor for medical advice about side effects. You may report side effects to FDA at 1-800-FDA-1088. Where should I keep my medicine? This drug is given in a hospital or clinic and will not be stored at home. NOTE: This sheet is a summary. It may not cover all possible information. If you have questions about this medicine, talk to your doctor, pharmacist, or health care provider.  2021 [employer] Standard (2008-03-11 16:50:29)

## 2021-01-06 NOTE — Progress Notes (Signed)
Hematology and Oncology Follow Up Visit  Angela Wilson [id number] 04-26-1966 55 y.o. 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 is in for follow-up.  Hopefully, restarting see that the treatment is helping her.  I think that her CA125 and CEA both have gone down a little bit.  The CEA 125 was down to 316.  The CEA was down to 29.  She is still  having some abdominal issues.  It is sometimes difficult to get the right pain medication for her.  Apparently, the Duragesic patches are little bit too strong for her.  We will see about decreasing the dose down to 75 mcg every other day.  She did not do well with the methadone.  We will have to make a change from the methadone.  We will try her on oxycodone.  I think this hopefully will help her out.  We will stop the Dilaudid.  She is little constipated.  I told her son-in-law to give her some MiraLAX.  She has had no cough.  She has had no bleeding.  There has been no fever.  She has had no leg swelling.  Currently, her performance status is ECOG 1.   Medications:  Current Outpatient Medications:  .  fentaNYL (DURAGESIC) 75 MCG/HR, Place 1 patch onto the skin every other day., Disp: 15 patch, Rfl: 0 .  oxyCODONE (OXY IR/ROXICODONE) 5 MG immediate release tablet, Take 1 tablet (5 mg total) by mouth every 4 (four) hours as needed for severe pain., Disp: 90 tablet, Rfl: 0 .  albuterol (PROVENTIL HFA;VENTOLIN HFA) 108 (90 Base) MCG/ACT inhaler, Inhale 2 puffs into the lungs every 4 (four) hours as needed for wheezing or shortness of breath. (Patient not taking: No sig reported), Disp: 1 Inhaler, Rfl: 0 .  amLODipine (NORVASC) 5 MG tablet, Take 1 tablet (5 mg total) by  mouth daily., Disp: 90 tablet, Rfl: 3 .  aspirin 81 MG chewable tablet, Chew 81 mg by mouth daily.  (Patient not taking: No sig reported), Disp: , Rfl:  .  cetirizine (ZYRTEC) 10 MG tablet, Take 1 tablet (10 mg total) by mouth daily., Disp: 30 tablet, Rfl: 3 .  CREON 36000-114000 units CPEP capsule, , Disp: , Rfl:  .  dronabinol (MARINOL) 2.5 MG capsule, Take 1 capsule (2.5 mg total) by mouth 2 (two) times daily before lunch and supper., Disp: 60 capsule, Rfl: 0 .  ergocalciferol (VITAMIN D2) 1.25 MG (50000 UT) capsule, Take 1 capsule (50,000 Units total) by mouth once a week., Disp: 6 capsule, Rfl: 3 .  gabapentin (NEURONTIN) 300 MG capsule, TAKE 2 CAPSULES BY MOUTH IN THE DAYTIME AND 2 CAPSULES AT BEDTIME, Disp: 120 capsule, Rfl: 3 .  glipiZIDE (GLUCOTROL) 10 MG tablet, Take 1 tablet (10 mg total) by mouth 2 (two) times daily., Disp: 60 tablet, Rfl: 1 .  lidocaine-prilocaine (EMLA) cream, APPLY TO PORT 1 HOUR BEFORE ACCESS., Disp: 30 g, Rfl: 3 .  lisinopril (ZESTRIL) 10 MG tablet, Take 1 tablet (10 mg total) by mouth daily., Disp: 90 tablet, Rfl: 1 .  loperamide (IMODIUM A-D) 2 MG tablet, Take 2 at onset of diarrhea, then 1 every 2hrs until 12hr without a BM. May take 2 tab  every 4hrs at bedtime. If diarrhea recurs repeat. (Patient not taking: Reported on 12/23/2020), Disp: 100 tablet, Rfl: 1 .  magic mouthwash w/lidocaine SOLN, Take 5 mLs by mouth 3 (three) times daily as needed for mouth pain. (Patient not taking: Reported on 12/23/2020), Disp: 450 mL, Rfl: 1 .  metFORMIN (GLUCOPHAGE XR) 500 MG 24 hr tablet, Take 1 tablet (500 mg total) by mouth daily with breakfast., Disp: 90 tablet, Rfl: 1 .  omeprazole (PRILOSEC) 40 MG capsule, Take 1 capsule (40 mg total) by mouth daily., Disp: 90 capsule, Rfl: 2 .  ondansetron (ZOFRAN) 4 MG tablet, Take 1 tablet (4 mg total) by mouth every 6 (six) hours as needed for nausea. (Patient not taking: Reported on 12/23/2020), Disp: 20 tablet, Rfl: 0 .   PB-Hyoscy-Atropine-Scopolamine (PHENOBARBITAL-BELLADONNA ALK) 16.2 MG/5ML ELIX, TAKE 5 MLS (16.2 MG TOTAL) BY MOUTH 4 (FOUR) TIMES DAILY., Disp: 600 mL, Rfl: 2 .  polyethylene glycol (MIRALAX / GLYCOLAX) 17 g packet, Take 17 g by mouth daily as needed. (Patient not taking: Reported on 12/23/2020), Disp: 14 each, Rfl: 0 .  prochlorperazine (COMPAZINE) 10 MG tablet, Take 1 tablet (10 mg total) by mouth every 6 (six) hours as needed for nausea or vomiting., Disp: 90 tablet, Rfl: 3 .  Pyridoxine HCl (VITAMIN B-6) 250 MG tablet, Take 1 tablet (250 mg total) by mouth daily., Disp: 30 tablet, Rfl: 6 No current facility-administered medications for this visit.  Facility-Administered Medications Ordered in Other Visits:  .  atropine injection 0.5 mg, 0.5 mg, Intravenous, Once PRN, Marin Olp, Rudell Cobb, MD .  fluorouracil (ADRUCIL) 4,000 mg in sodium chloride 0.9 % 70 mL chemo infusion, 1,920 mg/m2 (Treatment Plan Recorded), Intravenous, 1 day or 1 dose, Cadence Minton, Rudell Cobb, MD .  fosaprepitant (EMEND) 150 mg in sodium chloride 0.9 % 145 mL IVPB, 150 mg, Intravenous, Once, Volanda Napoleon, MD, Last Rate: 450 mL/hr at 01/06/21 1242, 150 mg at 01/06/21 1242 .  heparin lock flush 100 unit/mL, 500 Units, Intracatheter, Once PRN, Volanda Napoleon, MD .  irinotecan (CAMPTOSAR) 240 mg in sodium chloride 0.9 % 500 mL chemo infusion, 112.5 mg/m2 (Treatment Plan Recorded), Intravenous, Once, Tinzlee Craker, Rudell Cobb, MD .  leucovorin 832 mg in sodium chloride 0.9 % 250 mL infusion, 400 mg/m2 (Treatment Plan Recorded), Intravenous, Once, Doreather Hoxworth, Rudell Cobb, MD .  oxaliplatin (ELOXATIN) 135 mg in dextrose 5 % 500 mL chemo infusion, 63.75 mg/m2 (Treatment Plan Recorded), Intravenous, Once, Treylin Burtch R, MD .  sodium chloride flush (NS) 0.9 % injection 10 mL, 10 mL, Intracatheter, PRN, Volanda Napoleon, MD  Allergies:  Allergies  Allergen Reactions  . Iodinated Diagnostic Agents Itching and Rash  . Ivp Dye [Iodinated Diagnostic  Agents] Itching and Rash  . Morphine And Related Hives  . Penicillins Rash    Past Medical History, Surgical history, Social history, and Family History were reviewed and updated.  Review of Systems: Review of Systems  Constitutional: Negative.   HENT:  Negative.   Eyes: Negative.   Respiratory: Negative.   Cardiovascular: Negative.   Gastrointestinal: Positive for abdominal pain and nausea.  Endocrine: Negative.   Genitourinary: Negative.    Musculoskeletal: Negative.   Skin: Negative.   Neurological: Negative.   Hematological: Negative.   Psychiatric/Behavioral: Negative.     Physical Exam:  weight is 205 lb (93 kg). Her oral temperature is 98.5 F (36.9 C). Her blood pressure is 117/63 and her pulse is 95. Her respiration is 18 and oxygen saturation is 100%.  Wt Readings from Last 3 Encounters:  01/06/21 205 lb (93 kg)  12/23/20 (P) 207 lb (93.9 kg)  12/08/20 208 lb 8 oz (94.6 kg)   Her vital signs show a temperature of 97.1.  Pulse is 100.  Blood pressure 116/70.  Weight is 230 pounds.  Physical Exam Vitals reviewed.  HENT:     Head: Normocephalic and atraumatic.  Eyes:     Pupils: Pupils are equal, round, and reactive to light.  Cardiovascular:     Rate and Rhythm: Normal rate and regular rhythm.     Heart sounds: Normal heart sounds.  Pulmonary:     Effort: Pulmonary effort is normal.     Breath sounds: Normal breath sounds.  Abdominal:     General: Bowel sounds are normal.     Palpations: Abdomen is soft.  Musculoskeletal:        General: No tenderness or deformity. Normal range of motion.     Cervical back: Normal range of motion.  Lymphadenopathy:     Cervical: No cervical adenopathy.  Skin:    General: Skin is warm and dry.     Findings: No erythema or rash.  Neurological:     Mental Status: She is alert and oriented to person, place, and time.  Psychiatric:        Behavior: Behavior normal.        Thought Content: Thought content normal.         Judgment: Judgment normal.      Lab Results  Component Value Date   WBC 4.8 01/06/2021   HGB 10.9 (L) 01/06/2021   HCT 34.6 (L) 01/06/2021   MCV 85.2 01/06/2021   PLT 190 01/06/2021     Chemistry      Component Value Date/Time   NA 133 (L) 01/06/2021 1100   NA 136 01/07/2020 1256   K 4.3 01/06/2021 1100   CL 100 01/06/2021 1100   CO2 27 01/06/2021 1100   BUN 13 01/06/2021 1100   BUN 10 01/07/2020 1256   CREATININE 0.59 01/06/2021 1100      Component Value Date/Time   CALCIUM 9.4 01/06/2021 1100   ALKPHOS 50 01/06/2021 1100   AST 14 (L) 01/06/2021 1100   ALT 12 01/06/2021 1100   BILITOT 0.3 01/06/2021 1100       Impression and Plan: [name] is a very nice 55 year old woman from [place].  She has metastatic pancreatic cancer.  Hopefully, by the tumor markers, we have started see a response.  We will have to see what the tumor levels are today.  We are dealing with quality of life issues.  I just want her to have a good quality of life.  This really boils down to getting the pain under better control.  We will go with her fourth cycle of treatment.   I will have to set her up with a CT scan afterwards so we can see how she is responding.  Hopefully, we will be seeing a response.  We will plan to get her back to see [place] in another 3 weeks.  I will give her extra week off from treatment so we can get her CT scans done.    Volanda Napoleon, MD 4/20/202212:54 PM

## 2021-01-06 NOTE — Telephone Encounter (Signed)
appts made per 01-06-21 los, pt to gain sch in tx/avs   Angela Wilson

## 2021-01-07 ENCOUNTER — Other Ambulatory Visit (HOSPITAL_BASED_OUTPATIENT_CLINIC_OR_DEPARTMENT_OTHER): Payer: Self-pay

## 2021-01-07 ENCOUNTER — Encounter: Payer: Self-pay | Admitting: *Deleted

## 2021-01-07 LAB — CA 125: Cancer Antigen (CA) 125: 332 U/mL — ABNORMAL HIGH (ref 0.0–38.1)

## 2021-01-07 LAB — CEA (IN HOUSE-CHCC): CEA (CHCC-In House): 21.79 ng/mL — ABNORMAL HIGH (ref 0.00–5.00)

## 2021-01-08 ENCOUNTER — Other Ambulatory Visit: Payer: Self-pay

## 2021-01-08 ENCOUNTER — Encounter: Payer: Self-pay | Admitting: *Deleted

## 2021-01-08 ENCOUNTER — Inpatient Hospital Stay: Payer: 59

## 2021-01-08 DIAGNOSIS — Z5111 Encounter for antineoplastic chemotherapy: Secondary | ICD-10-CM | POA: Diagnosis not present

## 2021-01-08 DIAGNOSIS — C252 Malignant neoplasm of tail of pancreas: Secondary | ICD-10-CM

## 2021-01-08 MED ORDER — HEPARIN SOD (PORK) LOCK FLUSH 100 UNIT/ML IV SOLN
500.0000 [IU] | Freq: Once | INTRAVENOUS | Status: AC | PRN
  Administered 2021-01-08: 500 [IU]
  Filled 2021-01-08: qty 5

## 2021-01-08 MED ORDER — SODIUM CHLORIDE 0.9% FLUSH
10.0000 mL | INTRAVENOUS | Status: DC | PRN
  Administered 2021-01-08: 10 mL
  Filled 2021-01-08: qty 10

## 2021-01-08 NOTE — Patient Instructions (Signed)
Implanted Port Insertion, Care After This sheet gives you information about how to care for yourself after your procedure. Your health care provider may also give you more specific instructions. If you have problems or questions, contact your health care provider. What can I expect after the procedure? After the procedure, it is common to have:  Discomfort at the port insertion site.  Bruising on the skin over the port. This should improve over 3-4 days. Follow these instructions at home: Port care  After your port is placed, you will get a manufacturer's information card. The card has information about your port. Keep this card with you at all times.  Take care of the port as told by your health care provider. Ask your health care provider if you or a family member can get training for taking care of the port at home. A home health care nurse may also take care of the port.  Make sure to remember what type of port you have. Incision care  Follow instructions from your health care provider about how to take care of your port insertion site. Make sure you: ? Wash your hands with soap and water before and after you change your bandage (dressing). If soap and water are not available, use hand sanitizer. ? Change your dressing as told by your health care provider. ? Leave stitches (sutures), skin glue, or adhesive strips in place. These skin closures may need to stay in place for 2 weeks or longer. If adhesive strip edges start to loosen and curl up, you may trim the loose edges. Do not remove adhesive strips completely unless your health care provider tells you to do that.  Check your port insertion site every day for signs of infection. Check for: ? Redness, swelling, or pain. ? Fluid or blood. ? Warmth. ? Pus or a bad smell.      Activity  Return to your normal activities as told by your health care provider. Ask your health care provider what activities are safe for you.  Do not  lift anything that is heavier than 10 lb (4.5 kg), or the limit that you are told, until your health care provider says that it is safe. General instructions  Take over-the-counter and prescription medicines only as told by your health care provider.  Do not take baths, swim, or use a hot tub until your health care provider approves. Ask your health care provider if you may take showers. You may only be allowed to take sponge baths.  Do not drive for 24 hours if you were given a sedative during your procedure.  Wear a medical alert bracelet in case of an emergency. This will tell any health care providers that you have a port.  Keep all follow-up visits as told by your health care provider. This is important. Contact a health care provider if:  You cannot flush your port with saline as directed, or you cannot draw blood from the port.  You have a fever or chills.  You have redness, swelling, or pain around your port insertion site.  You have fluid or blood coming from your port insertion site.  Your port insertion site feels warm to the touch.  You have pus or a bad smell coming from the port insertion site. Get help right away if:  You have chest pain or shortness of breath.  You have bleeding from your port that you cannot control. Summary  Take care of the port as told by your   health care provider. Keep the manufacturer's information card with you at all times.  Change your dressing as told by your health care provider.  Contact a health care provider if you have a fever or chills or if you have redness, swelling, or pain around your port insertion site.  Keep all follow-up visits as told by your health care provider. This information is not intended to replace advice given to you by your health care provider. Make sure you discuss any questions you have with your health care provider. Document Revised: 04/03/2018 Document Reviewed: 04/03/2018 Elsevier Patient Education   2021 Elsevier Inc.  

## 2021-01-08 NOTE — Progress Notes (Signed)
Patient needed CT scheduled prior to her next Provider appointment. Contact patient's daughter via MyChart and she will get contrast while patient is here today for pump stop.   Oncology Nurse Navigator Documentation  Oncology Nurse Navigator Flowsheets 01/08/2021  Confirmed Diagnosis Date -  Diagnosis Status -  Planned Course of Treatment -  Phase of Treatment -  Chemotherapy Pending- Reason: -  Chemotherapy Actual Start Date: -  Navigator Follow Up Date: 01/27/2021  Navigator Follow Up Reason: Follow-up Appointment;Chemotherapy  Navigator Location [facility]  Referral Date to RadOnc/MedOnc -  Navigator Encounter Type Appt/Treatment Plan Review  Telephone -  Treatment Initiated Date -  Patient Visit Type MedOnc  Treatment Phase Active Tx  Barriers/Navigation Needs Coordination of Care;Cultural Needs;Family Concerns;Language/Communication;Education  Education Other  Interventions Coordination of Care;Education  Acuity Level 2-Minimal Needs (1-2 Barriers Identified)  Referrals -  Coordination of Care Radiology  Education Method Written  Support Groups/Services Friends and Family  Time Spent with Patient 27

## 2021-01-12 ENCOUNTER — Encounter: Payer: Self-pay | Admitting: *Deleted

## 2021-01-20 ENCOUNTER — Other Ambulatory Visit: Payer: Self-pay | Admitting: Family Medicine

## 2021-01-20 DIAGNOSIS — E1165 Type 2 diabetes mellitus with hyperglycemia: Secondary | ICD-10-CM

## 2021-01-26 ENCOUNTER — Encounter: Payer: Self-pay | Admitting: *Deleted

## 2021-01-27 ENCOUNTER — Inpatient Hospital Stay: Payer: 59

## 2021-01-27 ENCOUNTER — Ambulatory Visit: Payer: 59 | Admitting: Family

## 2021-01-27 ENCOUNTER — Other Ambulatory Visit: Payer: Self-pay

## 2021-01-27 ENCOUNTER — Other Ambulatory Visit: Payer: Self-pay | Admitting: *Deleted

## 2021-01-27 ENCOUNTER — Encounter: Payer: Self-pay | Admitting: *Deleted

## 2021-01-27 ENCOUNTER — Ambulatory Visit (HOSPITAL_BASED_OUTPATIENT_CLINIC_OR_DEPARTMENT_OTHER)
Admission: RE | Admit: 2021-01-27 | Discharge: 2021-01-27 | Disposition: A | Discharge status: Home / Self Care | Payer: 59 | Source: Ambulatory Visit | Attending: Hematology & Oncology | Admitting: Hematology & Oncology

## 2021-01-27 ENCOUNTER — Other Ambulatory Visit (HOSPITAL_BASED_OUTPATIENT_CLINIC_OR_DEPARTMENT_OTHER): Payer: Self-pay

## 2021-01-27 ENCOUNTER — Inpatient Hospital Stay (HOSPITAL_BASED_OUTPATIENT_CLINIC_OR_DEPARTMENT_OTHER): Payer: 59 | Admitting: Family

## 2021-01-27 ENCOUNTER — Inpatient Hospital Stay: Payer: 59 | Attending: Hematology & Oncology

## 2021-01-27 ENCOUNTER — Other Ambulatory Visit: Payer: Self-pay | Admitting: Hematology & Oncology

## 2021-01-27 ENCOUNTER — Ambulatory Visit: Payer: 59

## 2021-01-27 ENCOUNTER — Encounter: Payer: Self-pay | Admitting: Family

## 2021-01-27 VITALS — BP 120/62 | HR 92 | Temp 98.1°F | Resp 18 | Ht 64.0 in | Wt 203.0 lb

## 2021-01-27 DIAGNOSIS — C252 Malignant neoplasm of tail of pancreas: Secondary | ICD-10-CM

## 2021-01-27 DIAGNOSIS — C251 Malignant neoplasm of body of pancreas: Secondary | ICD-10-CM | POA: Diagnosis not present

## 2021-01-27 DIAGNOSIS — R1084 Generalized abdominal pain: Secondary | ICD-10-CM | POA: Diagnosis not present

## 2021-01-27 DIAGNOSIS — Z5111 Encounter for antineoplastic chemotherapy: Secondary | ICD-10-CM | POA: Insufficient documentation

## 2021-01-27 DIAGNOSIS — T451X5A Adverse effect of antineoplastic and immunosuppressive drugs, initial encounter: Secondary | ICD-10-CM

## 2021-01-27 DIAGNOSIS — R112 Nausea with vomiting, unspecified: Secondary | ICD-10-CM

## 2021-01-27 DIAGNOSIS — J910 Malignant pleural effusion: Secondary | ICD-10-CM | POA: Insufficient documentation

## 2021-01-27 DIAGNOSIS — C786 Secondary malignant neoplasm of retroperitoneum and peritoneum: Secondary | ICD-10-CM | POA: Insufficient documentation

## 2021-01-27 DIAGNOSIS — E119 Type 2 diabetes mellitus without complications: Secondary | ICD-10-CM | POA: Insufficient documentation

## 2021-01-27 DIAGNOSIS — Z79899 Other long term (current) drug therapy: Secondary | ICD-10-CM | POA: Insufficient documentation

## 2021-01-27 LAB — CBC WITH DIFFERENTIAL (CANCER CENTER ONLY)
Abs Immature Granulocytes: 0.02 10*3/uL (ref 0.00–0.07)
Basophils Absolute: 0 10*3/uL (ref 0.0–0.1)
Basophils Relative: 0 %
Eosinophils Absolute: 0.1 10*3/uL (ref 0.0–0.5)
Eosinophils Relative: 3 %
HCT: 36.4 % (ref 36.0–46.0)
Hemoglobin: 11.5 g/dL — ABNORMAL LOW (ref 12.0–15.0)
Immature Granulocytes: 1 %
Lymphocytes Relative: 38 %
Lymphs Abs: 1.6 10*3/uL (ref 0.7–4.0)
MCH: 26.8 pg (ref 26.0–34.0)
MCHC: 31.6 g/dL (ref 30.0–36.0)
MCV: 84.8 fL (ref 80.0–100.0)
Monocytes Absolute: 0.4 10*3/uL (ref 0.1–1.0)
Monocytes Relative: 10 %
Neutro Abs: 2 10*3/uL (ref 1.7–7.7)
Neutrophils Relative %: 48 %
Platelet Count: 159 10*3/uL (ref 150–400)
RBC: 4.29 MIL/uL (ref 3.87–5.11)
RDW: 15.1 % (ref 11.5–15.5)
WBC Count: 4.2 10*3/uL (ref 4.0–10.5)
nRBC: 0 % (ref 0.0–0.2)

## 2021-01-27 LAB — CMP (CANCER CENTER ONLY)
ALT: 11 U/L (ref 0–44)
AST: 14 U/L — ABNORMAL LOW (ref 15–41)
Albumin: 3.8 g/dL (ref 3.5–5.0)
Alkaline Phosphatase: 47 U/L (ref 38–126)
Anion gap: 7 (ref 5–15)
BUN: 13 mg/dL (ref 6–20)
CO2: 27 mmol/L (ref 22–32)
Calcium: 9.6 mg/dL (ref 8.9–10.3)
Chloride: 100 mmol/L (ref 98–111)
Creatinine: 0.6 mg/dL (ref 0.44–1.00)
GFR, Estimated: >60 mL/min (ref >60)
Glucose, Bld: 94 mg/dL (ref 70–99)
Potassium: 3.9 mmol/L (ref 3.5–5.1)
Sodium: 134 mmol/L — ABNORMAL LOW (ref 135–145)
Total Bilirubin: 0.4 mg/dL (ref 0.3–1.2)
Total Protein: 7.2 g/dL (ref 6.5–8.1)

## 2021-01-27 LAB — CEA (IN HOUSE-CHCC): CEA (CHCC-In House): 17.82 ng/mL — ABNORMAL HIGH (ref 0.00–5.00)

## 2021-01-27 MED ORDER — SODIUM CHLORIDE 0.9 % IV SOLN
400.0000 mg/m2 | Freq: Once | INTRAVENOUS | Status: AC
  Administered 2021-01-27: 832 mg via INTRAVENOUS
  Filled 2021-01-27: qty 25

## 2021-01-27 MED ORDER — SODIUM CHLORIDE 0.9 % IV SOLN
150.0000 mg | Freq: Once | INTRAVENOUS | Status: AC
  Administered 2021-01-27: 150 mg via INTRAVENOUS
  Filled 2021-01-27: qty 150

## 2021-01-27 MED ORDER — DEXTROSE 5 % IV SOLN
Freq: Once | INTRAVENOUS | Status: AC
  Filled 2021-01-27: qty 250

## 2021-01-27 MED ORDER — OXALIPLATIN CHEMO INJECTION 100 MG/20ML
63.7500 mg/m2 | Freq: Once | INTRAVENOUS | Status: AC
  Administered 2021-01-27: 135 mg via INTRAVENOUS
  Filled 2021-01-27: qty 20

## 2021-01-27 MED ORDER — FENTANYL 75 MCG/HR TD PT72
1.0000 | MEDICATED_PATCH | TRANSDERMAL | 0 refills | Status: DC
  Filled 2021-01-27: qty 15, 30d supply, fill #0

## 2021-01-27 MED ORDER — HYDROMORPHONE HCL 1 MG/ML IJ SOLN
INTRAMUSCULAR | Status: AC
  Filled 2021-01-27: qty 2

## 2021-01-27 MED ORDER — SODIUM CHLORIDE 0.9 % IV SOLN
10.0000 mg | Freq: Once | INTRAVENOUS | Status: AC
  Administered 2021-01-27: 10 mg via INTRAVENOUS
  Filled 2021-01-27: qty 10

## 2021-01-27 MED ORDER — SODIUM CHLORIDE 0.9 % IV SOLN
112.5000 mg/m2 | Freq: Once | INTRAVENOUS | Status: AC
  Administered 2021-01-27: 240 mg via INTRAVENOUS
  Filled 2021-01-27: qty 10

## 2021-01-27 MED ORDER — HYDROMORPHONE HCL 1 MG/ML IJ SOLN
2.0000 mg | Freq: Once | INTRAMUSCULAR | Status: AC
  Administered 2021-01-27: 2 mg via INTRAVENOUS

## 2021-01-27 MED ORDER — LORAZEPAM 2 MG/ML IJ SOLN
INTRAMUSCULAR | Status: AC
  Filled 2021-01-27: qty 1

## 2021-01-27 MED ORDER — PALONOSETRON HCL INJECTION 0.25 MG/5ML
INTRAVENOUS | Status: AC
  Filled 2021-01-27: qty 5

## 2021-01-27 MED ORDER — LORAZEPAM 0.5 MG PO TABS: ORAL_TABLET | ORAL | 0 refills | Status: DC

## 2021-01-27 MED ORDER — SODIUM CHLORIDE 0.9 % IV SOLN
1920.0000 mg/m2 | INTRAVENOUS | Status: DC
  Administered 2021-01-27: 4000 mg via INTRAVENOUS
  Filled 2021-01-27: qty 80

## 2021-01-27 MED ORDER — LORAZEPAM 2 MG/ML IJ SOLN
1.0000 mg | Freq: Once | INTRAMUSCULAR | Status: AC
  Administered 2021-01-27: 1 mg via INTRAVENOUS

## 2021-01-27 MED ORDER — PALONOSETRON HCL INJECTION 0.25 MG/5ML
0.2500 mg | Freq: Once | INTRAVENOUS | Status: AC
  Administered 2021-01-27: 0.25 mg via INTRAVENOUS

## 2021-01-27 NOTE — Patient Instructions (Signed)

## 2021-01-27 NOTE — Progress Notes (Signed)
Hematology and Oncology Follow Up Visit  Angela Wilson [id number] 06-Dec-1965 55 y.o. 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 She is doing fairly well but has some mild low abdominal pain.  She had nausea and vomiting for 5-6 days after her last cycle of treatment. The compazine and zofran were somewhat helpful. She does get Aloxi and Emend with each cycle. We have avoided steroids due to her diabetes.  Last visit CEA was 21 and CA 125 was 332.  CT of the chest, abdomen and pelvis today showed progression in the bilateral adnexal masses and peritoneal metastasis including a subcapsular implant in the dome of the left hepatic lobe.  No fever, chills, cough, rash, dizziness, SOB, chest pain, palpitations or changes in bowel or bladder habits.  No blood loss noted. No bruising or petechiae.  No swelling, numbness or tingling in her extremities. She has pain in her knees that waxes and wanes.  No falls or syncope to report.  She states that she has maintained a good appetite and is staying well hydrated. Her weight is stable at 203 lbs.   ECOG Performance Status: 1 - Symptomatic but completely ambulatory  Medications:  Allergies as of 01/27/2021      Reactions   Iodinated Diagnostic Agents Itching, Rash   Ivp Dye [iodinated Diagnostic Agents] Itching, Rash   Morphine And Related Hives   Penicillins Rash      Medication List       Accurate as of Jan 27, 2021  9:52 AM. If you have any questions, ask your nurse or doctor.        albuterol 108 (90 Base) MCG/ACT inhaler Commonly known as: VENTOLIN HFA Inhale 2 puffs into the lungs every 4  (four) hours as needed for wheezing or shortness of breath.   amLODipine 5 MG tablet Commonly known as: NORVASC Take 1 tablet (5 mg total) by mouth daily.   aspirin 81 MG chewable tablet Chew 81 mg by mouth daily.   cetirizine 10 MG tablet Commonly known as: ZYRTEC Take 1 tablet (10 mg total) by mouth daily.   Creon 36000 UNITS Cpep capsule Generic drug: lipase/protease/amylase   dronabinol 2.5 MG capsule Commonly known as: MARINOL Take 1 capsule (2.5 mg total) by mouth 2 (two) times daily before lunch and supper.   ergocalciferol 1.25 MG (50000 UT) capsule Commonly known as: VITAMIN D2 Take 1 capsule (50,000 Units total) by mouth once a week.   fentaNYL 75 MCG/HR Commonly known as: [place] 1 patch onto the skin every other day.   gabapentin 300 MG capsule Commonly known as: NEURONTIN TAKE 2 CAPSULES BY MOUTH IN THE DAYTIME AND 2 CAPSULES AT BEDTIME   glipiZIDE 10 MG tablet Commonly known as: GLUCOTROL Take 1 tablet (10 mg total) by mouth 2 (two) times daily.   lidocaine-prilocaine cream Commonly known as: EMLA APPLY TO PORT 1 HOUR BEFORE ACCESS.   lisinopril 10 MG tablet Commonly known as: ZESTRIL Take 1 tablet (10 mg total) by mouth daily.   loperamide 2 MG tablet Commonly known as: Imodium A-D Take 2 at onset of diarrhea, then 1 every 2hrs until 12hr  without a BM. May take 2 tab every 4hrs at bedtime. If diarrhea recurs repeat.   magic mouthwash w/lidocaine Soln Take 5 mLs by mouth 3 (three) times daily as needed for mouth pain.   metFORMIN 500 MG 24 hr tablet Commonly known as: Glucophage XR Take 1 tablet (500 mg total) by mouth daily with breakfast.   omeprazole 40 MG capsule Commonly known as: PRILOSEC Take 1 capsule (40 mg total) by mouth daily.   ondansetron 4 MG tablet Commonly known as: ZOFRAN Take 1 tablet (4 mg total) by mouth every 6 (six) hours as needed for nausea.   oxyCODONE 5 MG immediate release tablet Commonly known as: Oxy  IR/ROXICODONE Take 1 tablet (5 mg total) by mouth every 4 (four) hours as needed for severe pain.   PHENobarbital-Belladonna Alk 16.2 MG/5ML Elix TAKE 5 MLS (16.2 MG TOTAL) BY MOUTH 4 (FOUR) TIMES DAILY.   polyethylene glycol 17 g packet Commonly known as: MIRALAX / GLYCOLAX Take 17 g by mouth daily as needed.   prochlorperazine 10 MG tablet Commonly known as: COMPAZINE Take 1 tablet (10 mg total) by mouth every 6 (six) hours as needed for nausea or vomiting.   vitamin B-6 250 MG tablet Take 1 tablet (250 mg total) by mouth daily.       Allergies:  Allergies  Allergen Reactions  . Iodinated Diagnostic Agents Itching and Rash  . Ivp Dye [Iodinated Diagnostic Agents] Itching and Rash  . Morphine And Related Hives  . Penicillins Rash    Past Medical History, Surgical history, Social history, and Family History were reviewed and updated.  Review of Systems: All other 10 point review of systems is negative.   Physical Exam:  height is 5' 4" (1.626 m) and weight is 203 lb (92.1 kg). Her oral temperature is 98.1 F (36.7 C). Her blood pressure is 120/62 and her pulse is 92. Her respiration is 18 and oxygen saturation is 100%.   Wt Readings from Last 3 Encounters:  01/27/21 203 lb (92.1 kg)  01/06/21 205 lb (93 kg)  12/23/20 (P) 207 lb (93.9 kg)    Ocular: Sclerae unicteric, pupils equal, round and reactive to light Ear-nose-throat: Oropharynx clear, dentition fair Lymphatic: No cervical or supraclavicular adenopathy Lungs no rales or rhonchi, good excursion bilaterally Heart regular rate and rhythm, no murmur appreciated Abd soft, mild tenderness on exam of lower abdomen, positive bowel sounds, No organomegaly noted MSK no focal spinal tenderness, no joint edema Neuro: non-focal, well-oriented, appropriate affect Breasts: Deferred   Lab Results  Component Value Date   WBC 4.2 01/27/2021   HGB 11.5 (L) 01/27/2021   HCT 36.4 01/27/2021   MCV 84.8 01/27/2021   PLT  159 01/27/2021   Lab Results  Component Value Date   FERRITIN 284 06/15/2020   IRON 55 06/15/2020   TIBC 323 06/15/2020   UIBC 268 06/15/2020   IRONPCTSAT 17 (L) 06/15/2020   Lab Results  Component Value Date   RETICCTPCT 5.7 (H) 06/15/2020   RBC 4.29 01/27/2021   No results found for: KPAFRELGTCHN, LAMBDASER, KAPLAMBRATIO No results found for: IGGSERUM, IGA, IGMSERUM No results found for: Ronnald Ramp, A1GS, A2GS, Violet Baldy, MSPIKE, SPEI   Chemistry      Component Value Date/Time   NA 134 (L) 01/27/2021 0839   NA 136 01/07/2020 1256   K 3.9 01/27/2021 0839   CL 100 01/27/2021 0839   CO2 27 01/27/2021 0839   BUN 13 01/27/2021 0839   BUN 10  01/07/2020 1256   CREATININE 0.60 01/27/2021 0839      Component Value Date/Time   CALCIUM 9.6 01/27/2021 0839   ALKPHOS 47 01/27/2021 0839   AST 14 (L) 01/27/2021 0839   ALT 11 01/27/2021 0839   BILITOT 0.4 01/27/2021 0839       Impression and Plan: Angela Wilson is a very pleasant 55 yo [name] female with metastatic pancreatic cancer.  We will have her add sublingual ativan for nausea along with her current regimen.  We will proceed with treatment today as planned per MD.  Unfortunately, her CT scans showed progression of disease. Dr. Marin Olp present and was able to discuss with patient.  Laverna Peace, NP 5/11/20229:52 AM    Addendum:  I spoke with Angela Wilson and her daughter.  I reviewed the CT scan with them.  I realize that the tumor markers are going down.  However, I believe that the CT scan is much more consistent with what is going on with respect to her tumor.  The fact that she is still having discomfort in the pelvic area I think indicates that this tumor is more active.  The CT scan certainly shows the adnexal growth.  I know that our options are not that great.  I think that what would be reasonable would be infusional 5-FU with liposomal irinotecan.  I think she would be able  to tolerate this.  I would think the side effect profile would be reasonable.  Again, is been a year now since she was diagnosed.  She is done well.  We will see about making a change with treatment this month.  I spent about 45 minutes with she and her daughter this afternoon.  It is always fun talking with him.  I must say that Angela Wilson really has a great attitude.  She has such good family support.  This makes it a whole lot easier for her.  I realize that she will has to go over to [place] in the fall.  I mean it would be nice for her to go over there if possible.  We will just have to see how well she is doing.  Lattie Haw, MD

## 2021-01-27 NOTE — Progress Notes (Signed)
1530 patient c/o nausea, rubbing her stomach. Per sarah [place] give 1 mg ativan iv now.

## 2021-01-27 NOTE — Patient Instructions (Signed)
[facility]  Discharge Instructions: Thank you for choosing [facility] to provide your oncology and hematology care.   If you have a lab appointment with the Cancer Center, please go directly to the Cancer Center and check in at the registration area.  Wear comfortable clothing and clothing appropriate for easy access to any Portacath or PICC line.   We strive to give you quality time with your provider. You may need to reschedule your appointment if you arrive late (15 or more minutes).  Arriving late affects you and other patients whose appointments are after yours.  Also, if you miss three or more appointments without notifying the office, you may be dismissed from the clinic at the provider's discretion.      For prescription refill requests, have your pharmacy contact our office and allow 72 hours for refills to be completed.    Today you received the following chemotherapy and/or immunotherapy agents  5-fu, leucovorin, cpt-11, oxaliplatin   To help prevent nausea and vomiting after your treatment, we encourage you to take your nausea medication as directed.  BELOW ARE SYMPTOMS THAT SHOULD BE REPORTED IMMEDIATELY: *FEVER GREATER THAN 100.4 F (38 C) OR HIGHER *CHILLS OR SWEATING *NAUSEA AND VOMITING THAT IS NOT CONTROLLED WITH YOUR NAUSEA MEDICATION *UNUSUAL SHORTNESS OF BREATH *UNUSUAL BRUISING OR BLEEDING *URINARY PROBLEMS (pain or burning when urinating, or frequent urination) *BOWEL PROBLEMS (unusual diarrhea, constipation, pain near the anus) TENDERNESS IN MOUTH AND THROAT WITH OR WITHOUT PRESENCE OF ULCERS (sore throat, sores in mouth, or a toothache) UNUSUAL RASH, SWELLING OR PAIN  UNUSUAL VAGINAL DISCHARGE OR ITCHING   Items with * indicate a potential emergency and should be followed up as soon as possible or go to the Emergency Department if any problems should occur.  Please show the CHEMOTHERAPY ALERT CARD or IMMUNOTHERAPY ALERT  CARD at check-in to the Emergency Department and triage nurse. Should you have questions after your visit or need to cancel or reschedule your appointment, please contact [facility]  [phone] and follow the prompts.  Office hours are 8:00 a.m. to 4:30 p.m. Monday - Friday. Please note that voicemails left after 4:00 p.m. may not be returned until the following business day.  We are closed weekends and major holidays. You have access to a nurse at all times for urgent questions. Please call the main number to the clinic [phone] and follow the prompts.  For any non-urgent questions, you may also contact your provider using MyChart. We now offer e-Visits for anyone 18 and older to request care online for non-urgent symptoms. For details visit [email or web address].   Also download the MyChart app! Go to the app store, search "MyChart", open the app, select [facility], and log in with your MyChart username and password.  Due to Covid, a mask is required upon entering the hospital/clinic. If you do not have a mask, one will be given to you upon arrival. For doctor visits, patients may have 1 support person aged 18 or older with them. For treatment visits, patients cannot have anyone with them due to current Covid guidelines and our immunocompromised population.  

## 2021-01-28 ENCOUNTER — Telehealth: Payer: Self-pay | Admitting: *Deleted

## 2021-01-28 LAB — CA 125: Cancer Antigen (CA) 125: 291 U/mL — ABNORMAL HIGH (ref 0.0–38.1)

## 2021-01-28 NOTE — Progress Notes (Signed)
Patient is here for treatment after CT. Unfortunately CT shows progression. Patient will continue with today's treatment and start a new line of therapy at her next appointment.   Oncology Nurse Navigator Documentation  Oncology Nurse Navigator Flowsheets 01/27/2021  Confirmed Diagnosis Date -  Diagnosis Status -  Planned Course of Treatment -  Phase of Treatment -  Chemotherapy Pending- Reason: -  Chemotherapy Actual Start Date: -  Navigator Follow Up Date: 02/17/2021  Navigator Follow Up Reason: Follow-up Appointment;Chemotherapy  Navigator Location [facility]  Referral Date to RadOnc/MedOnc -  Navigator Encounter Type Treatment;Appt/Treatment Plan Review  Telephone -  Treatment Initiated Date -  Patient Visit Type MedOnc  Treatment Phase Active Tx  Barriers/Navigation Needs Coordination of Care;Cultural Needs;Family Concerns;Language/Communication;Education  Education -  Interventions Psycho-Social Support  Acuity Level 2-Minimal Needs (1-2 Barriers Identified)  Referrals -  Coordination of Care -  Education Method -  Support Groups/Services Friends and Family  Time Spent with Patient 30

## 2021-01-28 NOTE — Progress Notes (Signed)
DISCONTINUE OFF PATHWAY REGIMEN - Pancreatic Adenocarcinoma   [id number]:mFOLFIRINOX (Leucovorin IV D1 + Fluorouracil CIV D1,2 + Irinotecan IV D1 + Oxaliplatin IV D1) q14 Days:   A cycle is every 14 days:     Oxaliplatin      Leucovorin      Irinotecan      Fluorouracil   **Always confirm dose/schedule in your pharmacy ordering system**  REASON: Disease Progression PRIOR TREATMENT: Off Pathway: mFOLFIRINOX (Leucovorin IV D1 + Fluorouracil CIV D1,2 + Irinotecan IV D1 + Oxaliplatin IV D1) q14 Days TREATMENT RESPONSE: Partial Response (PR)  START OFF PATHWAY REGIMEN - Pancreatic Adenocarcinoma   [id number]:Liposomal Irinotecan 70 mg/m2 + Leucovorin 400 mg/m2 + Fluorouracil 2,400 mg/m2 CIV q14 Days:   A cycle is every 14 days:     Liposomal irinotecan      Leucovorin      Fluorouracil   **Always confirm dose/schedule in your pharmacy ordering system**  Patient Characteristics: Metastatic Disease, Third Line and Beyond, MSS/pMMR or MSI Unknown Therapeutic Status: Metastatic Disease Line of Therapy: Third Engineer, civil (consulting) Status: MSS/pMMR Intent of Therapy: Non-Curative / Palliative Intent, Discussed with Patient

## 2021-01-28 NOTE — Telephone Encounter (Signed)
Per 01/27/21 los  Called and lvm of upcoming appointments - view my chart - mail calendar

## 2021-01-29 ENCOUNTER — Other Ambulatory Visit: Payer: Self-pay

## 2021-01-29 ENCOUNTER — Inpatient Hospital Stay: Payer: 59

## 2021-01-29 VITALS — BP 114/66 | HR 96 | Temp 98.6°F | Resp 17

## 2021-01-29 DIAGNOSIS — C786 Secondary malignant neoplasm of retroperitoneum and peritoneum: Secondary | ICD-10-CM | POA: Diagnosis not present

## 2021-01-29 DIAGNOSIS — Z5111 Encounter for antineoplastic chemotherapy: Secondary | ICD-10-CM | POA: Diagnosis present

## 2021-01-29 DIAGNOSIS — Z95828 Presence of other vascular implants and grafts: Secondary | ICD-10-CM

## 2021-01-29 DIAGNOSIS — Z79899 Other long term (current) drug therapy: Secondary | ICD-10-CM | POA: Diagnosis not present

## 2021-01-29 DIAGNOSIS — E119 Type 2 diabetes mellitus without complications: Secondary | ICD-10-CM | POA: Diagnosis not present

## 2021-01-29 DIAGNOSIS — C252 Malignant neoplasm of tail of pancreas: Secondary | ICD-10-CM | POA: Diagnosis present

## 2021-01-29 DIAGNOSIS — J910 Malignant pleural effusion: Secondary | ICD-10-CM | POA: Diagnosis not present

## 2021-01-29 MED ORDER — HEPARIN SOD (PORK) LOCK FLUSH 100 UNIT/ML IV SOLN
500.0000 [IU] | Freq: Once | INTRAVENOUS | Status: AC
  Administered 2021-01-29: 500 [IU] via INTRAVENOUS
  Filled 2021-01-29: qty 5

## 2021-01-29 MED ORDER — SODIUM CHLORIDE 0.9% FLUSH
10.0000 mL | Freq: Once | INTRAVENOUS | Status: AC
Start: 2021-01-29 — End: 2021-01-29
  Administered 2021-01-29: 10 mL via INTRAVENOUS
  Filled 2021-01-29: qty 10

## 2021-01-29 NOTE — Patient Instructions (Signed)

## 2021-02-03 ENCOUNTER — Encounter: Payer: Self-pay | Admitting: *Deleted

## 2021-02-03 ENCOUNTER — Other Ambulatory Visit: Payer: Self-pay | Admitting: *Deleted

## 2021-02-03 DIAGNOSIS — Z794 Long term (current) use of insulin: Secondary | ICD-10-CM

## 2021-02-03 DIAGNOSIS — E1165 Type 2 diabetes mellitus with hyperglycemia: Secondary | ICD-10-CM

## 2021-02-03 MED ORDER — GLIPIZIDE 10 MG PO TABS: 10.0000 mg | ORAL_TABLET | Freq: Two times a day (BID) | ORAL | 1 refills | Status: DC

## 2021-02-08 ENCOUNTER — Encounter: Payer: Self-pay | Admitting: *Deleted

## 2021-02-09 ENCOUNTER — Encounter: Payer: Self-pay | Admitting: *Deleted

## 2021-02-09 NOTE — Progress Notes (Signed)
Oncology Nurse Navigator Documentation  Oncology Nurse Navigator Flowsheets 02/09/2021  Confirmed Diagnosis Date -  Diagnosis Status -  Planned Course of Treatment -  Phase of Treatment -  Chemotherapy Pending- Reason: -  Chemotherapy Actual Start Date: -  Navigator Follow Up Date: 02/17/2021  Navigator Follow Up Reason: Follow-up Appointment;Chemotherapy  Navigator Location [facility]  Referral Date to RadOnc/MedOnc -  Navigator Encounter Type MyChart;Letter/Fax/Email  Telephone -  Treatment Initiated Date -  Patient Visit Type MedOnc  Treatment Phase Active Tx  Barriers/Navigation Needs Coordination of Care;Cultural Needs;Family Concerns;Language/Communication;Education  Education -  Interventions Other  Acuity Level 2-Minimal Needs (1-2 Barriers Identified)  Referrals -  Coordination of Care -  Education Method -  Support Groups/Services Friends and Family  Time Spent with Patient 45

## 2021-02-10 ENCOUNTER — Inpatient Hospital Stay: Payer: 59

## 2021-02-10 ENCOUNTER — Inpatient Hospital Stay: Payer: 59 | Admitting: Hematology & Oncology

## 2021-02-12 ENCOUNTER — Inpatient Hospital Stay: Payer: 59

## 2021-02-16 ENCOUNTER — Telehealth: Payer: Self-pay | Admitting: *Deleted

## 2021-02-16 NOTE — Telephone Encounter (Signed)
Appeal paper worked faxed to [facility].

## 2021-02-17 ENCOUNTER — Inpatient Hospital Stay: Payer: 59 | Admitting: Hematology & Oncology

## 2021-02-17 ENCOUNTER — Other Ambulatory Visit: Payer: Self-pay

## 2021-02-17 ENCOUNTER — Inpatient Hospital Stay: Payer: 59

## 2021-02-17 ENCOUNTER — Inpatient Hospital Stay: Payer: 59 | Attending: Hematology & Oncology

## 2021-02-17 ENCOUNTER — Other Ambulatory Visit: Payer: Self-pay | Admitting: *Deleted

## 2021-02-17 ENCOUNTER — Encounter: Payer: Self-pay | Admitting: *Deleted

## 2021-02-17 ENCOUNTER — Encounter: Payer: Self-pay | Admitting: Hematology & Oncology

## 2021-02-17 ENCOUNTER — Other Ambulatory Visit (HOSPITAL_BASED_OUTPATIENT_CLINIC_OR_DEPARTMENT_OTHER): Payer: Self-pay

## 2021-02-17 VITALS — BP 114/64 | HR 96 | Temp 98.8°F | Resp 19 | Wt 202.0 lb

## 2021-02-17 DIAGNOSIS — Z79891 Long term (current) use of opiate analgesic: Secondary | ICD-10-CM | POA: Diagnosis not present

## 2021-02-17 DIAGNOSIS — J910 Malignant pleural effusion: Secondary | ICD-10-CM | POA: Diagnosis not present

## 2021-02-17 DIAGNOSIS — C252 Malignant neoplasm of tail of pancreas: Secondary | ICD-10-CM

## 2021-02-17 DIAGNOSIS — C251 Malignant neoplasm of body of pancreas: Secondary | ICD-10-CM

## 2021-02-17 DIAGNOSIS — B370 Candidal stomatitis: Secondary | ICD-10-CM

## 2021-02-17 DIAGNOSIS — G893 Neoplasm related pain (acute) (chronic): Secondary | ICD-10-CM | POA: Diagnosis not present

## 2021-02-17 DIAGNOSIS — Z5111 Encounter for antineoplastic chemotherapy: Secondary | ICD-10-CM | POA: Diagnosis not present

## 2021-02-17 LAB — CMP (CANCER CENTER ONLY)
ALT: 11 U/L (ref 0–44)
AST: 14 U/L — ABNORMAL LOW (ref 15–41)
Albumin: 3.8 g/dL (ref 3.5–5.0)
Alkaline Phosphatase: 56 U/L (ref 38–126)
Anion gap: 8 (ref 5–15)
BUN: 14 mg/dL (ref 6–20)
CO2: 28 mmol/L (ref 22–32)
Calcium: 9.8 mg/dL (ref 8.9–10.3)
Chloride: 100 mmol/L (ref 98–111)
Creatinine: 0.58 mg/dL (ref 0.44–1.00)
GFR, Estimated: >60 mL/min (ref >60)
Glucose, Bld: 151 mg/dL — ABNORMAL HIGH (ref 70–99)
Potassium: 4.3 mmol/L (ref 3.5–5.1)
Sodium: 136 mmol/L (ref 135–145)
Total Bilirubin: 0.3 mg/dL (ref 0.3–1.2)
Total Protein: 7.2 g/dL (ref 6.5–8.1)

## 2021-02-17 LAB — CBC WITH DIFFERENTIAL (CANCER CENTER ONLY)
Abs Immature Granulocytes: 0.03 10*3/uL (ref 0.00–0.07)
Basophils Absolute: 0 10*3/uL (ref 0.0–0.1)
Basophils Relative: 0 %
Eosinophils Absolute: 0.1 10*3/uL (ref 0.0–0.5)
Eosinophils Relative: 3 %
HCT: 37 % (ref 36.0–46.0)
Hemoglobin: 11.6 g/dL — ABNORMAL LOW (ref 12.0–15.0)
Immature Granulocytes: 1 %
Lymphocytes Relative: 33 %
Lymphs Abs: 1.6 10*3/uL (ref 0.7–4.0)
MCH: 26.5 pg (ref 26.0–34.0)
MCHC: 31.4 g/dL (ref 30.0–36.0)
MCV: 84.7 fL (ref 80.0–100.0)
Monocytes Absolute: 0.4 10*3/uL (ref 0.1–1.0)
Monocytes Relative: 8 %
Neutro Abs: 2.7 10*3/uL (ref 1.7–7.7)
Neutrophils Relative %: 55 %
Platelet Count: 191 10*3/uL (ref 150–400)
RBC: 4.37 MIL/uL (ref 3.87–5.11)
RDW: 14.8 % (ref 11.5–15.5)
WBC Count: 4.8 10*3/uL (ref 4.0–10.5)
nRBC: 0 % (ref 0.0–0.2)

## 2021-02-17 LAB — CEA (IN HOUSE-CHCC): CEA (CHCC-In House): 18.24 ng/mL — ABNORMAL HIGH (ref 0.00–5.00)

## 2021-02-17 MED ORDER — OXALIPLATIN CHEMO INJECTION 100 MG/20ML
63.7500 mg/m2 | Freq: Once | INTRAVENOUS | Status: AC
  Administered 2021-02-17: 130 mg via INTRAVENOUS
  Filled 2021-02-17: qty 20

## 2021-02-17 MED ORDER — DEXTROSE 5 % IV SOLN
Freq: Once | INTRAVENOUS | Status: AC
  Filled 2021-02-17: qty 250

## 2021-02-17 MED ORDER — HEPARIN SOD (PORK) LOCK FLUSH 100 UNIT/ML IV SOLN
500.0000 [IU] | Freq: Once | INTRAVENOUS | Status: DC | PRN
  Filled 2021-02-17: qty 5

## 2021-02-17 MED ORDER — LORAZEPAM 0.5 MG PO TABS
0.5000 mg | ORAL_TABLET | Freq: Four times a day (QID) | ORAL | 0 refills | Status: DC | PRN
  Filled 2021-02-17: qty 30, 8d supply, fill #0

## 2021-02-17 MED ORDER — DEXAMETHASONE SODIUM PHOSPHATE 100 MG/10ML IJ SOLN
10.0000 mg | Freq: Once | INTRAMUSCULAR | Status: AC
  Administered 2021-02-17: 10 mg via INTRAVENOUS
  Filled 2021-02-17: qty 10

## 2021-02-17 MED ORDER — FENTANYL 75 MCG/HR TD PT72
1.0000 | MEDICATED_PATCH | TRANSDERMAL | 0 refills | Status: DC
  Filled 2021-02-17: qty 15, 30d supply, fill #0

## 2021-02-17 MED ORDER — PALONOSETRON HCL INJECTION 0.25 MG/5ML
0.2500 mg | Freq: Once | INTRAVENOUS | Status: AC
  Administered 2021-02-17: 0.25 mg via INTRAVENOUS

## 2021-02-17 MED ORDER — PALONOSETRON HCL INJECTION 0.25 MG/5ML
INTRAVENOUS | Status: AC
  Filled 2021-02-17: qty 5

## 2021-02-17 MED ORDER — SODIUM CHLORIDE 0.9% FLUSH
10.0000 mL | INTRAVENOUS | Status: DC | PRN
  Filled 2021-02-17: qty 10

## 2021-02-17 MED ORDER — LEUCOVORIN CALCIUM INJECTION 350 MG
400.0000 mg/m2 | Freq: Once | INTRAMUSCULAR | Status: AC
  Administered 2021-02-17: 812 mg via INTRAVENOUS
  Filled 2021-02-17: qty 25

## 2021-02-17 MED ORDER — LIDOCAINE-PRILOCAINE 2.5-2.5 % EX CREA: TOPICAL_CREAM | CUTANEOUS | 3 refills | Status: DC

## 2021-02-17 MED ORDER — ATROPINE SULFATE 1 MG/ML IJ SOLN
INTRAMUSCULAR | Status: AC
  Filled 2021-02-17: qty 1

## 2021-02-17 MED ORDER — SODIUM CHLORIDE 0.9 % IV SOLN
1920.0000 mg/m2 | INTRAVENOUS | Status: DC
  Administered 2021-02-17: 3900 mg via INTRAVENOUS
  Filled 2021-02-17: qty 78

## 2021-02-17 MED ORDER — SODIUM CHLORIDE 0.9 % IV SOLN
150.0000 mg | Freq: Once | INTRAVENOUS | Status: AC
  Administered 2021-02-17: 150 mg via INTRAVENOUS
  Filled 2021-02-17: qty 150

## 2021-02-17 MED ORDER — SODIUM CHLORIDE 0.9 % IV SOLN
112.5000 mg/m2 | Freq: Once | INTRAVENOUS | Status: AC
  Administered 2021-02-17: 220 mg via INTRAVENOUS
  Filled 2021-02-17: qty 5

## 2021-02-17 MED ORDER — OXYCODONE HCL 5 MG PO TABS
5.0000 mg | ORAL_TABLET | ORAL | 0 refills | Status: DC | PRN
  Filled 2021-02-17: qty 90, 15d supply, fill #0

## 2021-02-17 MED ORDER — ATROPINE SULFATE 1 MG/ML IJ SOLN
0.5000 mg | Freq: Once | INTRAMUSCULAR | Status: AC | PRN
  Administered 2021-02-17: 0.5 mg via INTRAVENOUS

## 2021-02-17 MED ORDER — LIDOCAINE-PRILOCAINE 2.5-2.5 % EX CREA
TOPICAL_CREAM | CUTANEOUS | 3 refills | Status: AC
  Filled 2021-02-17 (×2): qty 30, 30d supply, fill #0

## 2021-02-17 NOTE — Progress Notes (Signed)
DISCONTINUE OFF PATHWAY REGIMEN - Pancreatic Adenocarcinoma   [id number]:Liposomal Irinotecan 70 mg/m2 + Leucovorin 400 mg/m2 + Fluorouracil 2,400 mg/m2 CIV q14 Days:   A cycle is every 14 days:     Liposomal irinotecan      Leucovorin      Fluorouracil   **Always confirm dose/schedule in your pharmacy ordering system**  REASON: Disease Progression PRIOR TREATMENT: Off Pathway: Liposomal Irinotecan 70 mg/m2 + Leucovorin 400 mg/m2 + Fluorouracil 2,400 mg/m2 CIV q14 Days TREATMENT RESPONSE: Partial Response (PR)  START OFF PATHWAY REGIMEN - Pancreatic Adenocarcinoma   [id number]:mFOLFIRINOX (Leucovorin IV D1 + Fluorouracil CIV D1,2 + Irinotecan IV D1 + Oxaliplatin IV D1) q14 Days:   A cycle is every 14 days:     Oxaliplatin      Leucovorin      Irinotecan      Fluorouracil   **Always confirm dose/schedule in your pharmacy ordering system**  Patient Characteristics: Metastatic Disease, Third Line and Beyond, MSS/pMMR or MSI Unknown Therapeutic Status: Metastatic Disease Line of Therapy: Third Engineer, civil (consulting) Status: MSS/pMMR Intent of Therapy: Non-Curative / Palliative Intent, Discussed with Patient

## 2021-02-17 NOTE — Progress Notes (Signed)
New treatment regimen not approved by insurance, therefor will resume prior treatment. Patient's family is still attempting to get her daughter to the states to visit before patient condition deteriorates. On their behalf, I sent an email to the [facility] requesting an expedited appointment for VISA application.   Sent: [email or web address]  Patient, Angela Wilson (DOB [birth year]) has been diagnosed with metastatic pancreatic cancer, a terminal illness, and is currently undergoing chemotherapy. She requires the urgent physical presence, and assistance of her daughter and son. She does not have a life expectancy of more than a few months. Please inform our office of what information you need to move forward with this request. Please include fax number as we can't send medical records through email.   Patient information [name] 09/27/1965 Passport #: [id number]  Medical Passport request for the following:  Hamoda, Rayan Raelyn Ensign  Date of birth: 01/05/1990  Passport: [id number]  [place] number: [id number]

## 2021-02-17 NOTE — Patient Instructions (Signed)
[facility]  Discharge Instructions: Thank you for choosing [facility] to provide your oncology and hematology care.   If you have a lab appointment with the [facility], please go directly to the [facility] and check in at the registration area.  Wear comfortable clothing and clothing appropriate for easy access to any Portacath or PICC line.   We strive to give you quality time with your provider. You may need to reschedule your appointment if you arrive late (15 or more minutes).  Arriving late affects you and other patients whose appointments are after yours.  Also, if you miss three or more appointments without notifying the office, you may be dismissed from the clinic at the provider's discretion.      For prescription refill requests, have your pharmacy contact our office and allow 72 hours for refills to be completed.    Today you received the following chemotherapy and/or immunotherapy agents Irinotecan, Oxaliplatin, Leucovorin and 5FU   To help prevent nausea and vomiting after your treatment, we encourage you to take your nausea medication as directed.  BELOW ARE SYMPTOMS THAT SHOULD BE REPORTED IMMEDIATELY: . *FEVER GREATER THAN 100.4 F (38 C) OR HIGHER . *CHILLS OR SWEATING . *NAUSEA AND VOMITING THAT IS NOT CONTROLLED WITH YOUR NAUSEA MEDICATION . *UNUSUAL SHORTNESS OF BREATH . *UNUSUAL BRUISING OR BLEEDING . *URINARY PROBLEMS (pain or burning when urinating, or frequent urination) . *BOWEL PROBLEMS (unusual diarrhea, constipation, pain near the anus) . TENDERNESS IN MOUTH AND THROAT WITH OR WITHOUT PRESENCE OF ULCERS (sore throat, sores in mouth, or a toothache) . UNUSUAL RASH, SWELLING OR PAIN  . UNUSUAL VAGINAL DISCHARGE OR ITCHING   Items with * indicate a potential emergency and should be followed up as soon as possible or go to the Emergency Department if any problems should occur.  Please show the CHEMOTHERAPY ALERT  CARD or IMMUNOTHERAPY ALERT CARD at check-in to the Emergency Department and triage nurse. Should you have questions after your visit or need to cancel or reschedule your appointment, please contact [facility]  [phone] and follow the prompts.  Office hours are 8:00 a.m. to 4:30 p.m. Monday - Friday. Please note that voicemails left after 4:00 p.m. may not be returned until the following business day.  We are closed weekends and major holidays. You have access to a nurse at all times for urgent questions. Please call the main number to the clinic [phone] and follow the prompts.  For any non-urgent questions, you may also contact your provider using MyChart. We now offer e-Visits for anyone 69 and older to request care online for non-urgent symptoms. For details visit [email or web address].   Also download the MyChart app! Go to the app store, search "MyChart", open the app, select [facility], and log in with your MyChart username and password.  Due to Covid, a mask is required upon entering the hospital/clinic. If you do not have a mask, one will be given to you upon arrival. For doctor visits, patients may have 1 support person aged 71 or older with them. For treatment visits, patients cannot have anyone with them due to current Covid guidelines and our immunocompromised population.

## 2021-02-17 NOTE — Patient Instructions (Signed)

## 2021-02-17 NOTE — Progress Notes (Signed)
PA okay for today per Lendell Caprice, Financial Advocate.

## 2021-02-17 NOTE — Progress Notes (Unsigned)
Hematology and Oncology Follow Up Visit  Angela Wilson [id number] Oct 25, 1965 55 y.o. 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 is here today with her son-in-law for follow-up.  Unfortunately, her insurance would not allow [place] to use the 5-FU/liposomal irinotecan.  I think that a no good option for her would be Xeloda with Gemcitabine.  I Believe That She Would Be Able to Tolerate This.  I Think That It Would Also Be Effective.  Today, we will go ahead with the FOLFIRINOX.  I dose reduced a little bit.  Her pain seems to be doing okay.  She is on the fentanyl patches in the oxycodone.  This does seem to be helping her.  She is going to the bathroom.  Her CA 125 was up to 312.  The CEA was 18.2.  She has had no bleeding.  She has had no cough or shortness of breath.  There has been no problems with fever.  Overall, I would have to say that her performance status is probably ECOG 1.  Medications:  Allergies as of 02/17/2021      Reactions   Iodinated Diagnostic Agents Itching, Rash   Ivp Dye [iodinated Diagnostic Agents] Itching, Rash   Morphine And Related Hives   Penicillins Rash      Medication List       Accurate as of February 17, 2021  9:24 AM. If you have any questions, ask your nurse or doctor.        albuterol 108 (90 Base) MCG/ACT inhaler Commonly known as: VENTOLIN HFA Inhale 2 puffs into the lungs every 4 (four) hours as needed for wheezing or shortness of breath.   amLODipine 5 MG tablet Commonly known as: NORVASC Take 1 tablet (5 mg total) by mouth daily.   aspirin 81 MG chewable tablet Chew 81 mg by mouth daily.   cetirizine 10 MG tablet Commonly known as:  ZYRTEC Take 1 tablet (10 mg total) by mouth daily.   Creon 36000 UNITS Cpep capsule Generic drug: lipase/protease/amylase   dronabinol 2.5 MG capsule Commonly known as: MARINOL Take 1 capsule (2.5 mg total) by mouth 2 (two) times daily before lunch and supper.   ergocalciferol 1.25 MG (50000 UT) capsule Commonly known as: VITAMIN D2 Take 1 capsule (50,000 Units total) by mouth once a week.   fentaNYL 75 MCG/HR Commonly known as: [place] 1 patch onto the skin every other day.   gabapentin 300 MG capsule Commonly known as: NEURONTIN TAKE 2 CAPSULES BY MOUTH IN THE DAYTIME AND 2 CAPSULES AT BEDTIME   glipiZIDE 10 MG tablet Commonly known as: GLUCOTROL Take 1 tablet (10 mg total) by mouth 2 (two) times daily.   lidocaine-prilocaine cream Commonly known as: EMLA APPLY TO PORT 1 HOUR BEFORE ACCESS.   lisinopril 10 MG tablet Commonly known as: ZESTRIL Take 1 tablet (10 mg total) by mouth daily.   LORazepam 0.5 MG tablet Commonly known as: ATIVAN Take 0.5 mg every six hours as needed for nausea.   magic mouthwash w/lidocaine Soln Take 5 mLs by mouth 3 (three) times daily as needed for mouth pain.   metFORMIN 500 MG 24 hr tablet Commonly known as: Glucophage XR  Take 1 tablet (500 mg total) by mouth daily with breakfast.   omeprazole 40 MG capsule Commonly known as: PRILOSEC Take 1 capsule (40 mg total) by mouth daily.   ondansetron 4 MG tablet Commonly known as: ZOFRAN Take 1 tablet (4 mg total) by mouth every 6 (six) hours as needed for nausea.   oxyCODONE 5 MG immediate release tablet Commonly known as: Oxy IR/ROXICODONE Take 1 tablet (5 mg total) by mouth every 4 (four) hours as needed for severe pain.   PHENobarbital-Belladonna Alk 16.2 MG/5ML Elix TAKE 5 MLS (16.2 MG TOTAL) BY MOUTH 4 (FOUR) TIMES DAILY.   polyethylene glycol 17 g packet Commonly known as: MIRALAX / GLYCOLAX Take 17 g by mouth daily as needed.   prochlorperazine 10 MG  tablet Commonly known as: COMPAZINE Take 1 tablet (10 mg total) by mouth every 6 (six) hours as needed for nausea or vomiting.   vitamin B-6 250 MG tablet Take 1 tablet (250 mg total) by mouth daily.       Allergies:  Allergies  Allergen Reactions  . Iodinated Diagnostic Agents Itching and Rash  . Ivp Dye [Iodinated Diagnostic Agents] Itching and Rash  . Morphine And Related Hives  . Penicillins Rash    Past Medical History, Surgical history, Social history, and Family History were reviewed and updated.  Review of Systems: All other 10 point review of systems is negative.   Physical Exam:  weight is 202 lb (91.6 kg). Her oral temperature is 98.8 F (37.1 C). Her blood pressure is 114/64 and her pulse is 96. Her respiration is 19 and oxygen saturation is 100%.   Wt Readings from Last 3 Encounters:  02/17/21 202 lb (91.6 kg)  01/27/21 203 lb (92.1 kg)  01/06/21 205 lb (93 kg)    Ocular: Sclerae unicteric, pupils equal, round and reactive to light Ear-nose-throat: Oropharynx clear, dentition fair Lymphatic: No cervical or supraclavicular adenopathy Lungs no rales or rhonchi, good excursion bilaterally Heart regular rate and rhythm, no murmur appreciated Abd soft, mild tenderness on exam of lower abdomen, positive bowel sounds, No organomegaly noted MSK no focal spinal tenderness, no joint edema Neuro: non-focal, well-oriented, appropriate affect Breasts: Deferred   Lab Results  Component Value Date   WBC 4.8 02/17/2021   HGB 11.6 (L) 02/17/2021   HCT 37.0 02/17/2021   MCV 84.7 02/17/2021   PLT 191 02/17/2021   Lab Results  Component Value Date   FERRITIN 284 06/15/2020   IRON 55 06/15/2020   TIBC 323 06/15/2020   UIBC 268 06/15/2020   IRONPCTSAT 17 (L) 06/15/2020   Lab Results  Component Value Date   RETICCTPCT 5.7 (H) 06/15/2020   RBC 4.37 02/17/2021   No results found for: KPAFRELGTCHN, LAMBDASER, KAPLAMBRATIO No results found for: IGGSERUM, IGA,  IGMSERUM No results found for: Ronnald Ramp, A1GS, A2GS, Violet Baldy, MSPIKE, SPEI   Chemistry      Component Value Date/Time   NA 134 (L) 01/27/2021 0839   NA 136 01/07/2020 1256   K 3.9 01/27/2021 0839   CL 100 01/27/2021 0839   CO2 27 01/27/2021 0839   BUN 13 01/27/2021 0839   BUN 10 01/07/2020 1256   CREATININE 0.60 01/27/2021 0839      Component Value Date/Time   CALCIUM 9.6 01/27/2021 0839   ALKPHOS 47 01/27/2021 0839   AST 14 (L) 01/27/2021 0839   ALT 11 01/27/2021 0839   BILITOT 0.4 01/27/2021 0839       Impression and Plan:  [name] is a very pleasant 55 yo [name] female with metastatic pancreatic cancer.   We will make a change with respect to the protocol now.  Again we will try her on Xeloda with gemcitabine.  Hopefully this will be effective.  We will probably have to dose reduce the Xeloda little bit.  I explained to her that even though it is a pill, it does have side effects.  I went over the side effects.  I explained about the swelling, redness and cracking of the hands and feet.  I told her to make sure she uses moisturizer quite a bit.  I explained the possibility of diarrhea.  I have explained the possibility that she may have nausea.  There may be fatigue.  She may have some mouth sores.  Thanks I do not worry too much about the skin being sensitized by the sun because she wears her vales.  She agrees to try Xeloda.  Again I think this is a reasonable way to go.  Again we will give her FOLFIRINOX today.  We will see about getting everything started in a week or so.  I realize that she still wants to go over to [place] I think in October.  Hopefully we will be able to get her over there.  Again we will have to be very cautious with his failure to nausea and vomiting.  Volanda Napoleon, MD 6/1/20229:24 AM

## 2021-02-18 ENCOUNTER — Other Ambulatory Visit (HOSPITAL_BASED_OUTPATIENT_CLINIC_OR_DEPARTMENT_OTHER): Payer: Self-pay

## 2021-02-18 ENCOUNTER — Telehealth: Payer: Self-pay

## 2021-02-18 LAB — CA 125: Cancer Antigen (CA) 125: 312 U/mL — ABNORMAL HIGH (ref 0.0–38.1)

## 2021-02-18 NOTE — Telephone Encounter (Signed)
No 02/17/21 los sent   Angela Wilson

## 2021-02-19 ENCOUNTER — Inpatient Hospital Stay: Payer: 59

## 2021-02-19 ENCOUNTER — Other Ambulatory Visit: Payer: Self-pay

## 2021-02-19 ENCOUNTER — Other Ambulatory Visit: Payer: Self-pay | Admitting: *Deleted

## 2021-02-19 ENCOUNTER — Other Ambulatory Visit (HOSPITAL_BASED_OUTPATIENT_CLINIC_OR_DEPARTMENT_OTHER): Payer: Self-pay

## 2021-02-19 VITALS — BP 107/71 | HR 102 | Temp 98.6°F | Resp 17

## 2021-02-19 DIAGNOSIS — C252 Malignant neoplasm of tail of pancreas: Secondary | ICD-10-CM

## 2021-02-19 DIAGNOSIS — R112 Nausea with vomiting, unspecified: Secondary | ICD-10-CM

## 2021-02-19 DIAGNOSIS — Z5111 Encounter for antineoplastic chemotherapy: Secondary | ICD-10-CM | POA: Diagnosis not present

## 2021-02-19 DIAGNOSIS — K8689 Other specified diseases of pancreas: Secondary | ICD-10-CM

## 2021-02-19 MED ORDER — LORAZEPAM 2 MG/ML IJ SOLN
INTRAMUSCULAR | Status: AC
  Filled 2021-02-19: qty 1

## 2021-02-19 MED ORDER — DRONABINOL 2.5 MG PO CAPS: 2.5000 mg | ORAL_CAPSULE | Freq: Two times a day (BID) | ORAL | 0 refills | Status: DC

## 2021-02-19 MED ORDER — LORAZEPAM 2 MG/ML IJ SOLN
1.0000 mg | Freq: Once | INTRAMUSCULAR | Status: AC
  Administered 2021-02-19: 1 mg via INTRAVENOUS

## 2021-02-19 MED ORDER — SODIUM CHLORIDE 0.9% FLUSH
10.0000 mL | INTRAVENOUS | Status: DC | PRN
  Administered 2021-02-19: 10 mL
  Filled 2021-02-19: qty 10

## 2021-02-19 MED ORDER — SODIUM CHLORIDE 0.9 % IV SOLN
Freq: Once | INTRAVENOUS | Status: AC
Start: 2021-02-19 — End: 2021-02-19
  Filled 2021-02-19: qty 250

## 2021-02-19 MED ORDER — HEPARIN SOD (PORK) LOCK FLUSH 100 UNIT/ML IV SOLN
500.0000 [IU] | Freq: Once | INTRAVENOUS | Status: AC | PRN
  Administered 2021-02-19: 500 [IU]
  Filled 2021-02-19: qty 5

## 2021-02-19 NOTE — Patient Instructions (Signed)
[facility]  Discharge Instructions: Thank you for choosing [facility] to provide your oncology and hematology care.   If you have a lab appointment with the [facility], please go directly to the [facility] and check in at the registration area.  Wear comfortable clothing and clothing appropriate for easy access to any Portacath or PICC line.   We strive to give you quality time with your provider. You may need to reschedule your appointment if you arrive late (15 or more minutes).  Arriving late affects you and other patients whose appointments are after yours.  Also, if you miss three or more appointments without notifying the office, you may be dismissed from the clinic at the provider's discretion.      For prescription refill requests, have your pharmacy contact our office and allow 72 hours for refills to be completed.    Today you received the following ativan, I.V. fluids     To help prevent nausea and vomiting after your treatment, we encourage you to take your nausea medication as directed.  BELOW ARE SYMPTOMS THAT SHOULD BE REPORTED IMMEDIATELY: . *FEVER GREATER THAN 100.4 F (38 C) OR HIGHER . *CHILLS OR SWEATING . *NAUSEA AND VOMITING THAT IS NOT CONTROLLED WITH YOUR NAUSEA MEDICATION . *UNUSUAL SHORTNESS OF BREATH . *UNUSUAL BRUISING OR BLEEDING . *URINARY PROBLEMS (pain or burning when urinating, or frequent urination) . *BOWEL PROBLEMS (unusual diarrhea, constipation, pain near the anus) . TENDERNESS IN MOUTH AND THROAT WITH OR WITHOUT PRESENCE OF ULCERS (sore throat, sores in mouth, or a toothache) . UNUSUAL RASH, SWELLING OR PAIN  . UNUSUAL VAGINAL DISCHARGE OR ITCHING   Items with * indicate a potential emergency and should be followed up as soon as possible or go to the Emergency Department if any problems should occur.  Please show the CHEMOTHERAPY ALERT CARD or IMMUNOTHERAPY ALERT CARD at check-in to the Emergency  Department and triage nurse. Should you have questions after your visit or need to cancel or reschedule your appointment, please contact [facility]  [phone] and follow the prompts.  Office hours are 8:00 a.m. to 4:30 p.m. Monday - Friday. Please note that voicemails left after 4:00 p.m. may not be returned until the following business day.  We are closed weekends and major holidays. You have access to a nurse at all times for urgent questions. Please call the main number to the clinic [phone] and follow the prompts.  For any non-urgent questions, you may also contact your provider using MyChart. We now offer e-Visits for anyone 14 and older to request care online for non-urgent symptoms. For details visit [email or web address].   Also download the MyChart app! Go to the app store, search "MyChart", open the app, select [facility], and log in with your MyChart username and password.  Due to Covid, a mask is required upon entering the hospital/clinic. If you do not have a mask, one will be given to you upon arrival. For doctor visits, patients may have 1 support person aged 72 or older with them. For treatment visits, patients cannot have anyone with them due to current Covid guidelines and our immunocompromised population.

## 2021-02-22 ENCOUNTER — Encounter: Payer: Self-pay | Admitting: *Deleted

## 2021-02-22 ENCOUNTER — Telehealth: Payer: Self-pay | Admitting: Pharmacist

## 2021-02-22 ENCOUNTER — Encounter: Payer: Self-pay | Admitting: Hematology & Oncology

## 2021-02-22 ENCOUNTER — Other Ambulatory Visit (HOSPITAL_COMMUNITY): Payer: Self-pay

## 2021-02-22 ENCOUNTER — Telehealth: Payer: Self-pay | Admitting: Pharmacy Technician

## 2021-02-22 ENCOUNTER — Telehealth: Payer: Self-pay | Admitting: *Deleted

## 2021-02-22 DIAGNOSIS — C252 Malignant neoplasm of tail of pancreas: Secondary | ICD-10-CM

## 2021-02-22 MED ORDER — CAPECITABINE 500 MG PO TABS
1250.0000 mg/m2 | ORAL_TABLET | Freq: Two times a day (BID) | ORAL | 6 refills | Status: DC
  Filled 2021-02-22: qty 140, 14d supply, fill #0

## 2021-02-22 NOTE — Telephone Encounter (Signed)
Per 02/17/21 los - called and gave upcoming appointments - view mychart

## 2021-02-22 NOTE — Telephone Encounter (Signed)
Oral Oncology Patient Advocate Encounter  Received notification from [employer] that prior authorization for Xeloda is required.  PA submitted on CoverMyMeds Key [id number] Status is pending  Oral Oncology Clinic will continue to follow.  [place] Patient [facility] Phone [phone] Fax [phone] 02/23/2021 8:33 AM

## 2021-02-22 NOTE — Progress Notes (Signed)
Patient has no follow up appointments as LOS was entered late. Message sent to scheduling.   Received the following response to email sent last week:  Thank you for your inquiry. You have contacted the [employer] unit. Please direct your visa related matter to [email or web address].   Sincerely,    ACS Correspondence Team  [place], [place]  mk [phone]    Composed email from last week sent to the new email address provided. Received a confirmation email that message from received. Patient's daughter, Rehab updated.   Oncology Nurse Navigator Documentation  Oncology Nurse Navigator Flowsheets 02/22/2021  Confirmed Diagnosis Date -  Diagnosis Status -  Planned Course of Treatment -  Phase of Treatment -  Chemotherapy Pending- Reason: -  Chemotherapy Actual Start Date: -  Navigator Follow Up Date: 03/04/2021  Navigator Follow Up Reason: Follow-up Appointment  Navigator Location [facility]  Referral Date to RadOnc/MedOnc -  Navigator Encounter Type MyChart;Letter/Fax/Email  Telephone -  Treatment Initiated Date -  Patient Visit Type MedOnc  Treatment Phase Active Tx  Barriers/Navigation Needs Coordination of Care;Cultural Needs;Family Concerns;Language/Communication;Education  Education Other  Interventions Coordination of Care;Other  Acuity Level 2-Minimal Needs (1-2 Barriers Identified)  Referrals -  Coordination of Care Appts  Education Method Written  Support Groups/Services Friends and Family  Time Spent with Patient 30

## 2021-02-22 NOTE — Telephone Encounter (Addendum)
Oral Oncology Pharmacist Encounter  Received new prescription for Xeloda (capecitabine) for the treatment of metastatic pancreatic cancer in conjunction with gemcitabine, planned duration until disease progression or unacceptable drug toxicity.  Prescription dose and frequency assessed - insurance requires dose to be 830 mg/m2 PO BID, frequency will stay at 14 days on, 7 days off per MD.   CBC w/ Diff and CMP from 02/17/21 assessed, labs overall stable, no baseline dose adjustments required at this time.  Current medication list in Epic reviewed, DDIs with Xeloda identified: Category C DDI between Xeloda and Omeprazole - proton-pump inhibitors can decrease efficacy of Xeloda - will discuss with patient alternatives to omeprazole, such as H2RA's like famotidine while on Xeloda.  Evaluated chart and no patient barriers to medication adherence noted.   Patient agreement for treatment documented in MD note on 02/17/21.  Prescription has been e-scribed to the [facility] for benefits analysis and approval.  Oral Oncology Clinic will continue to follow for insurance authorization, copayment issues, initial counseling and start date.  Leron Croak, PharmD, BCPS Hematology/Oncology Clinical Pharmacist [facility] [phone] 02/22/2021 8:28 AM

## 2021-02-23 ENCOUNTER — Other Ambulatory Visit (HOSPITAL_COMMUNITY): Payer: Self-pay

## 2021-02-23 NOTE — Telephone Encounter (Signed)
Oral Oncology Patient Advocate Encounter  Received fax from [facility] requesting additional clinical information.    Faxed to [phone].  [place] Patient [facility] Phone [phone] Fax [phone] 02/23/2021 10:42 AM

## 2021-02-24 ENCOUNTER — Encounter: Payer: Self-pay | Admitting: *Deleted

## 2021-02-24 ENCOUNTER — Other Ambulatory Visit (HOSPITAL_COMMUNITY): Payer: Self-pay

## 2021-02-24 MED ORDER — CAPECITABINE 500 MG PO TABS
830.0000 mg/m2 | ORAL_TABLET | Freq: Two times a day (BID) | ORAL | 6 refills | Status: DC
  Filled 2021-02-24: qty 84, 14d supply, fill #0

## 2021-02-24 NOTE — Progress Notes (Signed)
Received response to the email sent on Monday. Response sent to patient's daughter via [place].  Dear Applicant, Thank you for your email below. In order to make an emergency appointment, first, make the first available appointment on [email or web address]  and follow the prompts to request an emergency appointment. Please be informed that applicant will be required to attach justification for the emergency request .The  request will be looked at  and if the applicant  qualifies he [name] will get an answer soonest possible. Hope this information is helpful. Regards,  Haematologist. MT  Oncology Nurse Navigator Documentation  Oncology Nurse Navigator Flowsheets 02/24/2021  Confirmed Diagnosis Date -  Diagnosis Status -  Planned Course of Treatment -  Phase of Treatment -  Chemotherapy Pending- Reason: -  Chemotherapy Actual Start Date: -  Navigator Follow Up Date: 03/04/2021  Navigator Follow Up Reason: Follow-up Appointment;Chemotherapy  Navigator Location [facility]  Referral Date to RadOnc/MedOnc -  Navigator Encounter Type MyChart;Letter/Fax/Email  Telephone -  Treatment Initiated Date -  Patient Visit Type MedOnc  Treatment Phase Active Tx  Barriers/Navigation Needs Coordination of Care;Cultural Needs;Family Concerns;Language/Communication;Education  Education -  Interventions Other  Acuity Level 2-Minimal Needs (1-2 Barriers Identified)  Referrals -  Coordination of Care -  Education Method -  Support Groups/Services Friends and Family  Time Spent with Patient 15

## 2021-02-25 ENCOUNTER — Encounter: Payer: Self-pay | Admitting: *Deleted

## 2021-02-26 ENCOUNTER — Encounter: Payer: Self-pay | Admitting: *Deleted

## 2021-02-26 NOTE — Progress Notes (Signed)
Oncology Nurse Navigator Documentation  Oncology Nurse Navigator Flowsheets 02/26/2021  Confirmed Diagnosis Date -  Diagnosis Status -  Planned Course of Treatment -  Phase of Treatment -  Chemotherapy Pending- Reason: -  Chemotherapy Actual Start Date: -  Navigator Follow Up Date: 03/04/2021  Navigator Follow Up Reason: Follow-up Appointment;Chemotherapy  Navigator Location [facility]  Referral Date to RadOnc/MedOnc -  Navigator Encounter Type MyChart;Letter/Fax/Email  Telephone -  Treatment Initiated Date -  Patient Visit Type MedOnc  Treatment Phase Active Tx  Barriers/Navigation Needs Coordination of Care;Cultural Needs;Family Concerns;Language/Communication;Education  Education Other  Interventions Other  Acuity Level 2-Minimal Needs (1-2 Barriers Identified)  Referrals -  Coordination of Care Other  Education Method Written  Support Groups/Services Friends and Family  Time Spent with Patient 85

## 2021-02-26 NOTE — Telephone Encounter (Signed)
Received fax notification from [place] that the expedited appeal has been received and the determination will be made within 72 hours.  Phone number to follow up is [phone].  [place] Patient [place] Phone [phone] Fax [phone] 02/26/2021 3:56 PM

## 2021-03-01 ENCOUNTER — Other Ambulatory Visit (HOSPITAL_COMMUNITY): Payer: Self-pay

## 2021-03-01 MED ORDER — CAPECITABINE 500 MG PO TABS: 830.0000 mg/m2 | ORAL_TABLET | Freq: Two times a day (BID) | ORAL | 6 refills | Status: DC

## 2021-03-01 NOTE — Telephone Encounter (Signed)
Oral Oncology Pharmacist Encounter   Appeal for Xeloda (capecitabine) has been approved.  Patient's insurance requires Xeloda to be filled through [employer]. Prescription redirected to [facility] for dispensing.     Leron Croak, PharmD, BCPS Hematology/Oncology Clinical Pharmacist [facility] [phone] 03/01/2021 8:37 AM

## 2021-03-01 NOTE — Telephone Encounter (Signed)
Oral Oncology Patient Advocate Encounter  The appeal for Xeloda has been overturned.  PA# [id number] Effective dates: 02/27/21 through 02/26/22  Patient must use [employer] per insurance.  Oral Oncology Clinic will continue to follow.   [place] Patient [place] Phone [phone] Fax [phone] 03/01/2021 9:09 AM

## 2021-03-03 NOTE — Telephone Encounter (Signed)
Oral Chemotherapy Pharmacist Encounter  I spoke with patient's daughter, Rehab, for overview of: Xeloda (capecitabine) for the treatment of metastatic pancreatic cancer in conjunction with gemcitabine, planned duration until disease progression or unacceptable drug toxicity.  Counseled on administration, dosing, side effects, monitoring, drug-food interactions, safe handling, storage, and disposal.  Patient will take Xeloda 500mg  tablets, 3 tablets (1500mg ) by mouth in AM and 3 tabs (1500mg ) by mouth in PM, within 30 minutes of finishing meals, on days 1-14 of each 21 day cycle.   Xeloda start date: pending medication acquisition from [facility] - patient's daughter to call again today (03/03/21)  Adverse effects include but are not limited to: fatigue, decreased blood counts, GI upset, diarrhea, mouth sores, and hand-foot syndrome.  Patient has anti-emetic on hand and knows to take it if nausea develops.   Patient will obtain anti diarrheal and alert the office of 4 or more loose stools above baseline.  Reviewed importance of keeping a medication schedule and plan for any missed doses. No barriers to medication adherence identified.  Medication reconciliation performed and medication/allergy list updated. Patient is going to try to switch to Pepcid (famotidine) while she is on Xeloda due to risk of decreased efficacy of Xeloda when used in combination with omeprazole.   Insurance authorization for Xeloda has been obtained. Patient required to fill Xeloda through [facility].   All questions answered.  Patient's daughter voiced understanding and appreciation.   Medication education handout placed in mail for patient and family. Patient and family know to call the office with questions or concerns. Oral Chemotherapy Clinic phone number provided to patient.   Leron Croak, PharmD, BCPS Hematology/Oncology Clinical Pharmacist [facility] [phone] 03/03/2021 12:20 PM

## 2021-03-04 ENCOUNTER — Inpatient Hospital Stay (HOSPITAL_BASED_OUTPATIENT_CLINIC_OR_DEPARTMENT_OTHER): Payer: 59 | Admitting: Hematology & Oncology

## 2021-03-04 ENCOUNTER — Telehealth: Payer: Self-pay

## 2021-03-04 ENCOUNTER — Inpatient Hospital Stay: Payer: 59

## 2021-03-04 ENCOUNTER — Encounter: Payer: Self-pay | Admitting: Hematology & Oncology

## 2021-03-04 ENCOUNTER — Encounter: Payer: Self-pay | Admitting: *Deleted

## 2021-03-04 ENCOUNTER — Other Ambulatory Visit (HOSPITAL_BASED_OUTPATIENT_CLINIC_OR_DEPARTMENT_OTHER): Payer: Self-pay

## 2021-03-04 ENCOUNTER — Other Ambulatory Visit: Payer: Self-pay

## 2021-03-04 VITALS — BP 129/62 | HR 97 | Temp 98.5°F | Wt 202.0 lb

## 2021-03-04 DIAGNOSIS — C251 Malignant neoplasm of body of pancreas: Secondary | ICD-10-CM | POA: Diagnosis not present

## 2021-03-04 DIAGNOSIS — C252 Malignant neoplasm of tail of pancreas: Secondary | ICD-10-CM

## 2021-03-04 DIAGNOSIS — Z5111 Encounter for antineoplastic chemotherapy: Secondary | ICD-10-CM | POA: Diagnosis not present

## 2021-03-04 LAB — CMP (CANCER CENTER ONLY)
ALT: 9 U/L (ref 0–44)
AST: 12 U/L — ABNORMAL LOW (ref 15–41)
Albumin: 3.8 g/dL (ref 3.5–5.0)
Alkaline Phosphatase: 52 U/L (ref 38–126)
Anion gap: 8 (ref 5–15)
BUN: 14 mg/dL (ref 6–20)
CO2: 27 mmol/L (ref 22–32)
Calcium: 9.6 mg/dL (ref 8.9–10.3)
Chloride: 101 mmol/L (ref 98–111)
Creatinine: 0.56 mg/dL (ref 0.44–1.00)
GFR, Estimated: >60 mL/min (ref >60)
Glucose, Bld: 143 mg/dL — ABNORMAL HIGH (ref 70–99)
Potassium: 4.1 mmol/L (ref 3.5–5.1)
Sodium: 136 mmol/L (ref 135–145)
Total Bilirubin: 0.3 mg/dL (ref 0.3–1.2)
Total Protein: 6.7 g/dL (ref 6.5–8.1)

## 2021-03-04 LAB — CBC WITH DIFFERENTIAL (CANCER CENTER ONLY)
Abs Immature Granulocytes: 0.02 10*3/uL (ref 0.00–0.07)
Basophils Absolute: 0 10*3/uL (ref 0.0–0.1)
Basophils Relative: 0 %
Eosinophils Absolute: 0.2 10*3/uL (ref 0.0–0.5)
Eosinophils Relative: 4 %
HCT: 35.9 % — ABNORMAL LOW (ref 36.0–46.0)
Hemoglobin: 11.5 g/dL — ABNORMAL LOW (ref 12.0–15.0)
Immature Granulocytes: 1 %
Lymphocytes Relative: 34 %
Lymphs Abs: 1.4 10*3/uL (ref 0.7–4.0)
MCH: 27.3 pg (ref 26.0–34.0)
MCHC: 32 g/dL (ref 30.0–36.0)
MCV: 85.3 fL (ref 80.0–100.0)
Monocytes Absolute: 0.3 10*3/uL (ref 0.1–1.0)
Monocytes Relative: 8 %
Neutro Abs: 2.3 10*3/uL (ref 1.7–7.7)
Neutrophils Relative %: 53 %
Platelet Count: 197 10*3/uL (ref 150–400)
RBC: 4.21 MIL/uL (ref 3.87–5.11)
RDW: 15.3 % (ref 11.5–15.5)
WBC Count: 4.2 10*3/uL (ref 4.0–10.5)
nRBC: 0 % (ref 0.0–0.2)

## 2021-03-04 LAB — CEA (IN HOUSE-CHCC): CEA (CHCC-In House): 21.47 ng/mL — ABNORMAL HIGH (ref 0.00–5.00)

## 2021-03-04 MED ORDER — SODIUM CHLORIDE 0.9 % IV SOLN
1800.0000 mg | Freq: Once | INTRAVENOUS | Status: AC
  Administered 2021-03-04: 1800 mg via INTRAVENOUS
  Filled 2021-03-04: qty 21.04

## 2021-03-04 MED ORDER — SODIUM CHLORIDE 0.9 % IV SOLN
Freq: Once | INTRAVENOUS | Status: AC
  Filled 2021-03-04: qty 250

## 2021-03-04 MED ORDER — HYDROCODONE BIT-HOMATROP MBR 5-1.5 MG/5ML PO SOLN
5.0000 mL | Freq: Four times a day (QID) | ORAL | 0 refills | Status: AC | PRN
  Filled 2021-03-04: qty 250, 13d supply, fill #0

## 2021-03-04 MED ORDER — HEPARIN SOD (PORK) LOCK FLUSH 100 UNIT/ML IV SOLN
500.0000 [IU] | Freq: Once | INTRAVENOUS | Status: AC | PRN
  Administered 2021-03-04: 500 [IU]
  Filled 2021-03-04: qty 5

## 2021-03-04 MED ORDER — SODIUM CHLORIDE 0.9% FLUSH
10.0000 mL | INTRAVENOUS | Status: DC | PRN
  Administered 2021-03-04: 10 mL
  Filled 2021-03-04: qty 10

## 2021-03-04 MED ORDER — PROCHLORPERAZINE MALEATE 10 MG PO TABS
ORAL_TABLET | ORAL | Status: AC
  Filled 2021-03-04: qty 1

## 2021-03-04 MED ORDER — PROCHLORPERAZINE MALEATE 10 MG PO TABS
10.0000 mg | ORAL_TABLET | Freq: Once | ORAL | Status: AC
  Administered 2021-03-04: 10 mg via ORAL

## 2021-03-04 NOTE — Progress Notes (Signed)
Oncology Nurse Navigator Documentation  Oncology Nurse Navigator Flowsheets 03/04/2021  Confirmed Diagnosis Date -  Diagnosis Status -  Planned Course of Treatment -  Phase of Treatment -  Chemotherapy Pending- Reason: -  Chemotherapy Actual Start Date: -  Navigator Follow Up Date: 03/26/2021  Navigator Follow Up Reason: Follow-up Appointment;Chemotherapy  Navigator Location [facility]  Referral Date to RadOnc/MedOnc -  Navigator Encounter Type Appt/Treatment Plan Review  Telephone -  Treatment Initiated Date -  Patient Visit Type MedOnc  Treatment Phase Active Tx  Barriers/Navigation Needs Coordination of Care;Cultural Needs;Family Concerns;Language/Communication;Education  Education -  Interventions None Required  Acuity Level 2-Minimal Needs (1-2 Barriers Identified)  Referrals -  Coordination of Care -  Education Method -  Support Groups/Services Friends and Family  Time Spent with Patient 15

## 2021-03-04 NOTE — Patient Instructions (Signed)
Implanted Port Home Guide An implanted port is a device that is placed under the skin. It is usually placed in the chest. The device can be used to give IV medicine, to take blood, or for dialysis. You may have an implanted port if: You need IV medicine that would be irritating to the small veins in your hands or arms. You need IV medicines, such as antibiotics, for a long period of time. You need IV nutrition for a long period of time. You need dialysis. When you have a port, your health care provider can choose to use the port instead of veins in your arms for these procedures. You may have fewer limitations when using a port than you would if you used other types of long-term IVs, and you will likely be able to return to normal activities afteryour incision heals. An implanted port has two main parts: Reservoir. The reservoir is the part where a needle is inserted to give medicines or draw blood. The reservoir is round. After it is placed, it appears as a small, raised area under your skin. Catheter. The catheter is a thin, flexible tube that connects the reservoir to a vein. Medicine that is inserted into the reservoir goes into the catheter and then into the vein. How is my port accessed? To access your port: A numbing cream may be placed on the skin over the port site. Your health care provider will put on a mask and sterile gloves. The skin over your port will be cleaned carefully with a germ-killing soap and allowed to dry. Your health care provider will gently pinch the port and insert a needle into it. Your health care provider will check for a blood return to make sure the port is in the vein and is not clogged. If your port needs to remain accessed to get medicine continuously (constant infusion), your health care provider will place a clear bandage (dressing) over the needle site. The dressing and needle will need to be changed every week, or as told by your health care provider. What  is flushing? Flushing helps keep the port from getting clogged. Follow instructions from your health care provider about how and when to flush the port. Ports are usually flushed with saline solution or a medicine called heparin. The need for flushing will depend on how the port is used: If the port is only used from time to time to give medicines or draw blood, the port may need to be flushed: Before and after medicines have been given. Before and after blood has been drawn. As part of routine maintenance. Flushing may be recommended every 4-6 weeks. If a constant infusion is running, the port may not need to be flushed. Throw away any syringes in a disposal container that is meant for sharp items (sharps container). You can buy a sharps container from a pharmacy, or you can make one by using an empty hard plastic bottle with a cover. How long will my port stay implanted? The port can stay in for as long as your health care provider thinks it is needed. When it is time for the port to come out, a surgery will be done to remove it. The surgery will be similar to the procedure that was done to putthe port in. Follow these instructions at home:  Flush your port as told by your health care provider. If you need an infusion over several days, follow instructions from your health care provider about how to take   care of your port site. Make sure you: Wash your hands with soap and water before you change your dressing. If soap and water are not available, use alcohol-based hand sanitizer. Change your dressing as told by your health care provider. Place any used dressings or infusion bags into a plastic bag. Throw that bag in the trash. Keep the dressing that covers the needle clean and dry. Do not get it wet. Do not use scissors or sharp objects near the tube. Keep the tube clamped, unless it is being used. Check your port site every day for signs of infection. Check for: Redness, swelling, or  pain. Fluid or blood. Pus or a bad smell. Protect the skin around the port site. Avoid wearing bra straps that rub or irritate the site. Protect the skin around your port from seat belts. Place a soft pad over your chest if needed. Bathe or shower as told by your health care provider. The site may get wet as long as you are not actively receiving an infusion. Return to your normal activities as told by your health care provider. Ask your health care provider what activities are safe for you. Carry a medical alert card or wear a medical alert bracelet at all times. This will let health care providers know that you have an implanted port in case of an emergency. Get help right away if: You have redness, swelling, or pain at the port site. You have fluid or blood coming from your port site. You have pus or a bad smell coming from the port site. You have a fever. Summary Implanted ports are usually placed in the chest for long-term IV access. Follow instructions from your health care provider about flushing the port and changing bandages (dressings). Take care of the area around your port by avoiding clothing that puts pressure on the area, and by watching for signs of infection. Protect the skin around your port from seat belts. Place a soft pad over your chest if needed. Get help right away if you have a fever or you have redness, swelling, pain, drainage, or a bad smell at the port site. This information is not intended to replace advice given to you by your health care provider. Make sure you discuss any questions you have with your healthcare provider. Document Revised: 01/20/2020 Document Reviewed: 01/20/2020 Elsevier Patient Education  2022 Elsevier Inc.  

## 2021-03-04 NOTE — Progress Notes (Signed)
Hematology and Oncology Follow Up Visit  Angela Wilson [id number] Jun 10, 1966 55 y.o. 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 is here today with her son-in-law for follow-up.  We are now going to start her on gemcitabine/Xeloda.  Unfortunately, her insurance company would not allow me to do the dose that I thought would help.  We have given her a reduced dose of treatment.  I still think this will be effective.  Her last CA125 was 312.  The CEA was 18.2.  She seems be doing pretty well.  Her son-in-law, who comes in with her, seems to think that she is feeling okay.  Pain seems to be under fairly good control.  She has had no vomiting.  She has had a little bit of nausea..  There is been no problems with bleeding.  She has had no diarrhea.  There is been no leg swelling.  Overall, her performance status is ECOG 1.  oday, we will go ahead with the FOLFIRINOX.  I dose reduced a little bit.  Her pain seems to be doing okay.  She is on the fentanyl patches in the oxycodone.  This does seem to be helping her.  She is going to the bathroom.  Her CA 125 was up to 312.  The CEA was 18.2.  She has had no bleeding.  She has had no cough or shortness of breath.  There has been no problems with fever.  Overall, I would have to say that her performance status is probably ECOG 1.  Medications:  Allergies as of 03/04/2021       Reactions   Iodinated Diagnostic Agents Itching, Rash   Ivp Dye [iodinated Diagnostic Agents] Itching, Rash   Morphine And Related Hives   Penicillins Rash        Medication List        Accurate as  of March 04, 2021 10:48 AM. If you have any questions, ask your nurse or doctor.          albuterol 108 (90 Base) MCG/ACT inhaler Commonly known as: VENTOLIN HFA Inhale 2 puffs into the lungs every 4 (four) hours as needed for wheezing or shortness of breath.   amLODipine 5 MG tablet Commonly known as: NORVASC Take 1 tablet (5 mg total) by mouth daily.   aspirin 81 MG chewable tablet Chew 81 mg by mouth daily.   capecitabine 500 MG tablet Commonly known as: Xeloda Take 3 tablets (1,500 mg total) by mouth 2 (two) times daily after a meal. Take for 14 days on, 7 days off. Repeat every 21 days.   cetirizine 10 MG tablet Commonly known as: ZYRTEC Take 1 tablet (10 mg total) by mouth daily.   Creon 36000 UNITS Cpep capsule Generic drug: lipase/protease/amylase   dronabinol 2.5 MG capsule Commonly known as: MARINOL Take 1 capsule (2.5 mg total) by mouth 2 (two) times daily before lunch and supper.   ergocalciferol 1.25 MG (50000 UT) capsule Commonly known as: VITAMIN D2 Take 1 capsule (50,000 Units total) by mouth once a week.   fentaNYL 75 MCG/HR Commonly known as: [place] 1  patch onto the skin every other day.   gabapentin 300 MG capsule Commonly known as: NEURONTIN TAKE 2 CAPSULES BY MOUTH IN THE DAYTIME AND 2 CAPSULES AT BEDTIME   glipiZIDE 10 MG tablet Commonly known as: GLUCOTROL Take 1 tablet (10 mg total) by mouth 2 (two) times daily.   lidocaine-prilocaine cream Commonly known as: EMLA APPLY TO PORT 1 HOUR BEFORE ACCESS.   lisinopril 10 MG tablet Commonly known as: ZESTRIL Take 1 tablet (10 mg total) by mouth daily.   LORazepam 0.5 MG tablet Commonly known as: ATIVAN Take 0.5 mg every six hours as needed for nausea.   magic mouthwash w/lidocaine Soln Take 5 mLs by mouth 3 (three) times daily as needed for mouth pain.   metFORMIN 500 MG 24 hr tablet Commonly known as: Glucophage XR Take 1 tablet (500 mg total) by mouth daily with breakfast.    omeprazole 40 MG capsule Commonly known as: PRILOSEC Take 1 capsule (40 mg total) by mouth daily.   ondansetron 4 MG tablet Commonly known as: ZOFRAN Take 1 tablet (4 mg total) by mouth every 6 (six) hours as needed for nausea.   oxyCODONE 5 MG immediate release tablet Commonly known as: Oxy IR/ROXICODONE Take 1 tablet (5 mg total) by mouth every 4 (four) hours as needed for severe pain.   PHENobarbital-Belladonna Alk 16.2 MG/5ML Elix TAKE 5 MLS (16.2 MG TOTAL) BY MOUTH 4 (FOUR) TIMES DAILY.   polyethylene glycol 17 g packet Commonly known as: MIRALAX / GLYCOLAX Take 17 g by mouth daily as needed.   prochlorperazine 10 MG tablet Commonly known as: COMPAZINE Take 1 tablet (10 mg total) by mouth every 6 (six) hours as needed for nausea or vomiting.   vitamin B-6 250 MG tablet Take 1 tablet (250 mg total) by mouth daily.        Allergies:  Allergies  Allergen Reactions   Iodinated Diagnostic Agents Itching and Rash   Ivp Dye [Iodinated Diagnostic Agents] Itching and Rash   Morphine And Related Hives   Penicillins Rash    Past Medical History, Surgical history, Social history, and Family History were reviewed and updated.  Review of Systems: All other 10 point review of systems is negative.   Physical Exam:  weight is 202 lb (91.6 kg). Her oral temperature is 98.5 F (36.9 C). Her blood pressure is 129/62 and her pulse is 97. Her oxygen saturation is 100%.   Wt Readings from Last 3 Encounters:  03/04/21 202 lb (91.6 kg)  02/17/21 202 lb (91.6 kg)  01/27/21 203 lb (92.1 kg)    Ocular: Sclerae unicteric, pupils equal, round and reactive to light Ear-nose-throat: Oropharynx clear, dentition fair Lymphatic: No cervical or supraclavicular adenopathy Lungs no rales or rhonchi, good excursion bilaterally Heart regular rate and rhythm, no murmur appreciated Abd soft, mild tenderness on exam of lower abdomen, positive bowel sounds, No organomegaly noted MSK no focal  spinal tenderness, no joint edema Neuro: non-focal, well-oriented, appropriate affect Breasts: Deferred   Lab Results  Component Value Date   WBC 4.2 03/04/2021   HGB 11.5 (L) 03/04/2021   HCT 35.9 (L) 03/04/2021   MCV 85.3 03/04/2021   PLT 197 03/04/2021   Lab Results  Component Value Date   FERRITIN 284 06/15/2020   IRON 55 06/15/2020   TIBC 323 06/15/2020   UIBC 268 06/15/2020   IRONPCTSAT 17 (L) 06/15/2020   Lab Results  Component Value Date   RETICCTPCT 5.7 (H) 06/15/2020   RBC 4.21 03/04/2021  No results found for: KPAFRELGTCHN, LAMBDASER, KAPLAMBRATIO No results found for: IGGSERUM, IGA, IGMSERUM No results found for: Odetta Pink, SPEI   Chemistry      Component Value Date/Time   NA 136 03/04/2021 0850   NA 136 01/07/2020 1256   K 4.1 03/04/2021 0850   CL 101 03/04/2021 0850   CO2 27 03/04/2021 0850   BUN 14 03/04/2021 0850   BUN 10 01/07/2020 1256   CREATININE 0.56 03/04/2021 0850      Component Value Date/Time   CALCIUM 9.6 03/04/2021 0850   ALKPHOS 52 03/04/2021 0850   AST 12 (L) 03/04/2021 0850   ALT 9 03/04/2021 0850   BILITOT 0.3 03/04/2021 0850       Impression and Plan: [name] is a very pleasant 55 yo [name] female with metastatic pancreatic cancer.   We will make a change with respect to the protocol now.  Again we will try her on Xeloda with gemcitabine.  Hopefully this will be effective.  Typically, the tumor markers will tell us how everything is going.  I am just focus on her quality of life.  I know that she still wants to go down to [place].  I know this is very important for her.  Hopefully, she will start the Xeloda in a couple days.  I am not sure was taking so long for her to get this.  Again, we want to focus on her quality of life.  I will plan to see her back in 3 weeks.    Volanda Napoleon, MD 6/16/202210:48 AM

## 2021-03-04 NOTE — Telephone Encounter (Signed)
Appts added per 03/04/21 los and pt will gain a sch in tx/avs and through [employer]

## 2021-03-04 NOTE — Patient Instructions (Signed)
[facility]  Discharge Instructions: Thank you for choosing [facility] to provide your oncology and hematology care.   If you have a lab appointment with the Cancer Center, please go directly to the Cancer Center and check in at the registration area.  Wear comfortable clothing and clothing appropriate for easy access to any Portacath or PICC line.   We strive to give you quality time with your provider. You may need to reschedule your appointment if you arrive late (15 or more minutes).  Arriving late affects you and other patients whose appointments are after yours.  Also, if you miss three or more appointments without notifying the office, you may be dismissed from the clinic at the provider's discretion.      For prescription refill requests, have your pharmacy contact our office and allow 72 hours for refills to be completed.    Today you received the following chemotherapy and/or immunotherapy agents Gemzar       To help prevent nausea and vomiting after your treatment, we encourage you to take your nausea medication as directed.  BELOW ARE SYMPTOMS THAT SHOULD BE REPORTED IMMEDIATELY: *FEVER GREATER THAN 100.4 F (38 C) OR HIGHER *CHILLS OR SWEATING *NAUSEA AND VOMITING THAT IS NOT CONTROLLED WITH YOUR NAUSEA MEDICATION *UNUSUAL SHORTNESS OF BREATH *UNUSUAL BRUISING OR BLEEDING *URINARY PROBLEMS (pain or burning when urinating, or frequent urination) *BOWEL PROBLEMS (unusual diarrhea, constipation, pain near the anus) TENDERNESS IN MOUTH AND THROAT WITH OR WITHOUT PRESENCE OF ULCERS (sore throat, sores in mouth, or a toothache) UNUSUAL RASH, SWELLING OR PAIN  UNUSUAL VAGINAL DISCHARGE OR ITCHING   Items with * indicate a potential emergency and should be followed up as soon as possible or go to the Emergency Department if any problems should occur.  Please show the CHEMOTHERAPY ALERT CARD or IMMUNOTHERAPY ALERT CARD at check-in to the  Emergency Department and triage nurse. Should you have questions after your visit or need to cancel or reschedule your appointment, please contact [facility]  [phone] and follow the prompts.  Office hours are 8:00 a.m. to 4:30 p.m. Monday - Friday. Please note that voicemails left after 4:00 p.m. may not be returned until the following business day.  We are closed weekends and major holidays. You have access to a nurse at all times for urgent questions. Please call the main number to the clinic [phone] and follow the prompts.  For any non-urgent questions, you may also contact your provider using MyChart. We now offer e-Visits for anyone 18 and older to request care online for non-urgent symptoms. For details visit [email or web address].   Also download the MyChart app! Go to the app store, search "MyChart", open the app, select , and log in with your MyChart username and password.  Due to Covid, a mask is required upon entering the hospital/clinic. If you do not have a mask, one will be given to you upon arrival. For doctor visits, patients may have 1 support person aged 18 or older with them. For treatment visits, patients cannot have anyone with them due to current Covid guidelines and our immunocompromised population.  

## 2021-03-04 NOTE — Telephone Encounter (Signed)
Called Monday 03/01/21 to check status of xeloda and [employer] rep stated they would be reaching out to the patient that day.  Called Tuesday and was told the same thing but that the rep messaged the team to make rx urgent.  Wednesday patient had not been contacted.  I contacted our [place], Kevan Ny, to have him check to see what the issue was and he said there was a delay due to a reject code and that he request it be taken care of ASAP.  [place] Patient [place] Phone [phone] Fax [phone] 03/04/2021 12:27 PM

## 2021-03-04 NOTE — Addendum Note (Signed)
Addended by: Burney Gauze R on: 03/04/2021 12:08 PM   Modules accepted: Orders

## 2021-03-05 ENCOUNTER — Other Ambulatory Visit: Payer: Self-pay | Admitting: Pharmacist

## 2021-03-05 ENCOUNTER — Encounter: Payer: Self-pay | Admitting: *Deleted

## 2021-03-05 DIAGNOSIS — C252 Malignant neoplasm of tail of pancreas: Secondary | ICD-10-CM

## 2021-03-05 LAB — CA 125: Cancer Antigen (CA) 125: 374 U/mL — ABNORMAL HIGH (ref 0.0–38.1)

## 2021-03-05 MED ORDER — CAPECITABINE 500 MG PO TABS: 830.0000 mg/m2 | ORAL_TABLET | Freq: Two times a day (BID) | ORAL | 6 refills | Status: DC

## 2021-03-05 NOTE — Progress Notes (Signed)
Received notification the the Xeloda for Angela Wilson needs to be filled at [facility]. Prescription redirected to [facility].

## 2021-03-05 NOTE — Progress Notes (Signed)
Patient's daughter, Rehab, has many questions about her mom's [place] regarding insurance. I was unable to answer her questions. Gave her the number to our Specialty Pharmacy team who has been working on processing the [place]. She will reach out.  Oncology Nurse Navigator Documentation  Oncology Nurse Navigator Flowsheets 03/05/2021  Confirmed Diagnosis Date -  Diagnosis Status -  Planned Course of Treatment -  Phase of Treatment -  Chemotherapy Pending- Reason: -  Chemotherapy Actual Start Date: -  Navigator Follow Up Date: 03/26/2021  Navigator Follow Up Reason: Follow-up Appointment;Chemotherapy  Navigator Location [facility]  Referral Date to RadOnc/MedOnc -  Navigator Encounter Type Telephone  Telephone Outgoing Call;Medication Assistance  Treatment Initiated Date -  Patient Visit Type MedOnc  Treatment Phase Active Tx  Barriers/Navigation Needs Coordination of Care;Cultural Needs;Family Concerns;Language/Communication;Education  Education Other  Interventions Education;Psycho-Social Support  Acuity Level 2-Minimal Needs (1-2 Barriers Identified)  Referrals -  Coordination of Care -  Education Method Verbal  Support Groups/Services Friends and Family  Time Spent with Patient 15

## 2021-03-05 NOTE — Progress Notes (Signed)
Emailed my contact at [facility] and asked that they mark the Xeloda prescription as urgent. They emailed this afternoon that the order was complete with a $0.00 copay and the pharmacist will be reaching out shortly to schedule the delivery.

## 2021-03-08 ENCOUNTER — Encounter: Payer: Self-pay | Admitting: *Deleted

## 2021-03-08 NOTE — Telephone Encounter (Signed)
Xeloda could not be filled at [facility] and was resent to [facility] pharmacy.  Medication was shipped 6/17 for 6/18 delivery.  [facility] Phone [phone] Fax [phone] 03/08/2021 11:19 AM

## 2021-03-08 NOTE — Progress Notes (Signed)
Sent another request for emergency VISA appointment to [email or web address] per patient's daughter, Alanson Aly, request.  To Whom It May Concern,  Patient, [place] (DOB 10/26/1965) has been diagnosed with metastatic pancreatic cancer, a terminal illness, and is currently undergoing chemotherapy. She requires the urgent physical presence, and assistance of her daughter and son. She does not have a life expectancy of more than a few months. Please inform our office of what information you need to move forward with this request. Please include fax number as we can't send medical records through email.   Patient information Kayliegh Boyers 11-Feb-1966 Passport #: [id number]  Medical Passport request for the following:  Hamoda, Rayan Raelyn Ensign  Date of birth: 01/05/1990  Passport: [id number]  D 16 CEAc BARCODE number: [id number]  This applicant currently has an appointment on 06/16/21 (DS-160 Confirmation Number [id number]) however this appointment is too far out given the condition of her mother. Please allow for this applicant to be seen as soon as possible.  Oncology Nurse Navigator Documentation  Oncology Nurse Navigator Flowsheets 03/08/2021  Confirmed Diagnosis Date -  Diagnosis Status -  Planned Course of Treatment -  Phase of Treatment -  Chemotherapy Pending- Reason: -  Chemotherapy Actual Start Date: -  Navigator Follow Up Date: 03/26/2021  Navigator Follow Up Reason: Follow-up Appointment;Chemotherapy  Navigator Location [facility]  Referral Date to RadOnc/MedOnc -  Navigator Encounter Type MyChart;Letter/Fax/Email  Telephone -  Treatment Initiated Date -  Patient Visit Type MedOnc  Treatment Phase Active Tx  Barriers/Navigation Needs Coordination of Care;Cultural Needs;Family Concerns;Language/Communication;Education  Education -  Interventions Coordination of Care  Acuity Level 2-Minimal Needs (1-2 Barriers Identified)  Referrals -   Coordination of Care Other  Education Method Verbal  Support Groups/Services Friends and Family  Time Spent with Patient 30

## 2021-03-09 ENCOUNTER — Encounter: Payer: Self-pay | Admitting: Hematology & Oncology

## 2021-03-15 ENCOUNTER — Encounter: Payer: Self-pay | Admitting: *Deleted

## 2021-03-24 ENCOUNTER — Encounter: Payer: Self-pay | Admitting: *Deleted

## 2021-03-24 NOTE — Progress Notes (Signed)
Patient's daughter requested call. She would like to know if patient can be seen tomorrow instead of Friday. Informed her that there was no availability on the schedule and the Friday appointment would have to stay. She understood.   Oncology Nurse Navigator Documentation  Oncology Nurse Navigator Flowsheets 03/24/2021  Confirmed Diagnosis Date -  Diagnosis Status -  Planned Course of Treatment -  Phase of Treatment -  Chemotherapy Pending- Reason: -  Chemotherapy Actual Start Date: -  Navigator Follow Up Date: 03/26/2021  Navigator Follow Up Reason: Follow-up Appointment;Chemotherapy  Navigator Location [facility]  Referral Date to RadOnc/MedOnc -  Navigator Encounter Type MyChart;Telephone  Telephone Appt Confirmation/Clarification;Outgoing Call  Treatment Initiated Date -  Patient Visit Type MedOnc  Treatment Phase Active Tx  Barriers/Navigation Needs Coordination of Care;Cultural Needs;Family Concerns;Language/Communication;Education  Education Other  Interventions Education;Psycho-Social Support  Acuity Level 2-Minimal Needs (1-2 Barriers Identified)  Referrals -  Coordination of Care -  Education Method Verbal  Support Groups/Services Friends and Family  Time Spent with Patient 15

## 2021-03-26 ENCOUNTER — Inpatient Hospital Stay (HOSPITAL_BASED_OUTPATIENT_CLINIC_OR_DEPARTMENT_OTHER): Payer: 59 | Admitting: Hematology & Oncology

## 2021-03-26 ENCOUNTER — Encounter: Payer: Self-pay | Admitting: *Deleted

## 2021-03-26 ENCOUNTER — Inpatient Hospital Stay: Payer: 59

## 2021-03-26 ENCOUNTER — Other Ambulatory Visit: Payer: Self-pay

## 2021-03-26 ENCOUNTER — Other Ambulatory Visit (HOSPITAL_BASED_OUTPATIENT_CLINIC_OR_DEPARTMENT_OTHER): Payer: Self-pay

## 2021-03-26 ENCOUNTER — Inpatient Hospital Stay: Payer: 59 | Attending: Hematology & Oncology

## 2021-03-26 ENCOUNTER — Encounter: Payer: Self-pay | Admitting: Hematology & Oncology

## 2021-03-26 VITALS — BP 93/55 | HR 90 | Temp 98.3°F | Resp 18 | Wt 205.0 lb

## 2021-03-26 DIAGNOSIS — C252 Malignant neoplasm of tail of pancreas: Secondary | ICD-10-CM

## 2021-03-26 DIAGNOSIS — Z5111 Encounter for antineoplastic chemotherapy: Secondary | ICD-10-CM | POA: Diagnosis present

## 2021-03-26 DIAGNOSIS — C772 Secondary and unspecified malignant neoplasm of intra-abdominal lymph nodes: Secondary | ICD-10-CM | POA: Diagnosis not present

## 2021-03-26 DIAGNOSIS — J910 Malignant pleural effusion: Secondary | ICD-10-CM | POA: Insufficient documentation

## 2021-03-26 DIAGNOSIS — Z79891 Long term (current) use of opiate analgesic: Secondary | ICD-10-CM | POA: Diagnosis not present

## 2021-03-26 DIAGNOSIS — C251 Malignant neoplasm of body of pancreas: Secondary | ICD-10-CM | POA: Diagnosis not present

## 2021-03-26 DIAGNOSIS — G893 Neoplasm related pain (acute) (chronic): Secondary | ICD-10-CM | POA: Diagnosis not present

## 2021-03-26 LAB — CBC WITH DIFFERENTIAL (CANCER CENTER ONLY)
Abs Immature Granulocytes: 0.05 10*3/uL (ref 0.00–0.07)
Basophils Absolute: 0 10*3/uL (ref 0.0–0.1)
Basophils Relative: 0 %
Eosinophils Absolute: 0.1 10*3/uL (ref 0.0–0.5)
Eosinophils Relative: 2 %
HCT: 35.5 % — ABNORMAL LOW (ref 36.0–46.0)
Hemoglobin: 11.5 g/dL — ABNORMAL LOW (ref 12.0–15.0)
Immature Granulocytes: 1 %
Lymphocytes Relative: 33 %
Lymphs Abs: 1.8 10*3/uL (ref 0.7–4.0)
MCH: 28.1 pg (ref 26.0–34.0)
MCHC: 32.4 g/dL (ref 30.0–36.0)
MCV: 86.8 fL (ref 80.0–100.0)
Monocytes Absolute: 0.5 10*3/uL (ref 0.1–1.0)
Monocytes Relative: 9 %
Neutro Abs: 3 10*3/uL (ref 1.7–7.7)
Neutrophils Relative %: 55 %
Platelet Count: 245 10*3/uL (ref 150–400)
RBC: 4.09 MIL/uL (ref 3.87–5.11)
RDW: 17.4 % — ABNORMAL HIGH (ref 11.5–15.5)
WBC Count: 5.4 10*3/uL (ref 4.0–10.5)
nRBC: 0 % (ref 0.0–0.2)

## 2021-03-26 LAB — CMP (CANCER CENTER ONLY)
ALT: 11 U/L (ref 0–44)
AST: 16 U/L (ref 15–41)
Albumin: 3.8 g/dL (ref 3.5–5.0)
Alkaline Phosphatase: 49 U/L (ref 38–126)
Anion gap: 8 (ref 5–15)
BUN: 16 mg/dL (ref 6–20)
CO2: 27 mmol/L (ref 22–32)
Calcium: 9.7 mg/dL (ref 8.9–10.3)
Chloride: 102 mmol/L (ref 98–111)
Creatinine: 0.61 mg/dL (ref 0.44–1.00)
GFR, Estimated: >60 mL/min (ref >60)
Glucose, Bld: 116 mg/dL — ABNORMAL HIGH (ref 70–99)
Potassium: 4.1 mmol/L (ref 3.5–5.1)
Sodium: 137 mmol/L (ref 135–145)
Total Bilirubin: 0.6 mg/dL (ref 0.3–1.2)
Total Protein: 7 g/dL (ref 6.5–8.1)

## 2021-03-26 LAB — CEA (IN HOUSE-CHCC): CEA (CHCC-In House): 32.95 ng/mL — ABNORMAL HIGH (ref 0.00–5.00)

## 2021-03-26 MED ORDER — SODIUM CHLORIDE 0.9 % IV SOLN: 10.0000 mg | Freq: Once | INTRAVENOUS | Status: DC

## 2021-03-26 MED ORDER — SODIUM CHLORIDE 0.9% FLUSH
10.0000 mL | INTRAVENOUS | Status: DC | PRN
  Administered 2021-03-26: 10 mL
  Filled 2021-03-26: qty 10

## 2021-03-26 MED ORDER — FENTANYL 75 MCG/HR TD PT72: 1.0000 | MEDICATED_PATCH | TRANSDERMAL | 0 refills | Status: DC

## 2021-03-26 MED ORDER — SODIUM CHLORIDE 0.9 % IV SOLN
Freq: Once | INTRAVENOUS | Status: AC
  Filled 2021-03-26: qty 250

## 2021-03-26 MED ORDER — PALONOSETRON HCL INJECTION 0.25 MG/5ML
INTRAVENOUS | Status: AC
  Filled 2021-03-26: qty 5

## 2021-03-26 MED ORDER — SODIUM CHLORIDE 0.9 % IV SOLN
1800.0000 mg | Freq: Once | INTRAVENOUS | Status: AC
  Administered 2021-03-26: 1800 mg via INTRAVENOUS
  Filled 2021-03-26: qty 21.04

## 2021-03-26 MED ORDER — PROCHLORPERAZINE MALEATE 10 MG PO TABS
10.0000 mg | ORAL_TABLET | Freq: Once | ORAL | Status: AC
  Administered 2021-03-26: 10 mg via ORAL

## 2021-03-26 MED ORDER — PROCHLORPERAZINE MALEATE 10 MG PO TABS
ORAL_TABLET | ORAL | Status: AC
  Filled 2021-03-26: qty 1

## 2021-03-26 MED ORDER — PALONOSETRON HCL INJECTION 0.25 MG/5ML: 0.2500 mg | Freq: Once | INTRAVENOUS | Status: DC

## 2021-03-26 MED ORDER — HEPARIN SOD (PORK) LOCK FLUSH 100 UNIT/ML IV SOLN
500.0000 [IU] | Freq: Once | INTRAVENOUS | Status: AC | PRN
  Administered 2021-03-26: 500 [IU]
  Filled 2021-03-26: qty 5

## 2021-03-26 MED ORDER — DEXAMETHASONE 4 MG PO TABS
4.0000 mg | ORAL_TABLET | Freq: Two times a day (BID) | ORAL | 0 refills | Status: AC
Start: 2021-03-26 — End: ?
  Filled 2021-03-26: qty 24, 12d supply, fill #0

## 2021-03-26 NOTE — Patient Instructions (Signed)
Implanted Port Home Guide An implanted port is a device that is placed under the skin. It is usually placed in the chest. The device can be used to give IV medicine, to take blood, or for dialysis. You may have an implanted port if: You need IV medicine that would be irritating to the small veins in your hands or arms. You need IV medicines, such as antibiotics, for a long period of time. You need IV nutrition for a long period of time. You need dialysis. When you have a port, your health care provider can choose to use the port instead of veins in your arms for these procedures. You may have fewer limitations when using a port than you would if you used other types of long-term IVs, and you will likely be able to return to normal activities afteryour incision heals. An implanted port has two main parts: Reservoir. The reservoir is the part where a needle is inserted to give medicines or draw blood. The reservoir is round. After it is placed, it appears as a small, raised area under your skin. Catheter. The catheter is a thin, flexible tube that connects the reservoir to a vein. Medicine that is inserted into the reservoir goes into the catheter and then into the vein. How is my port accessed? To access your port: A numbing cream may be placed on the skin over the port site. Your health care provider will put on a mask and sterile gloves. The skin over your port will be cleaned carefully with a germ-killing soap and allowed to dry. Your health care provider will gently pinch the port and insert a needle into it. Your health care provider will check for a blood return to make sure the port is in the vein and is not clogged. If your port needs to remain accessed to get medicine continuously (constant infusion), your health care provider will place a clear bandage (dressing) over the needle site. The dressing and needle will need to be changed every week, or as told by your health care provider. What  is flushing? Flushing helps keep the port from getting clogged. Follow instructions from your health care provider about how and when to flush the port. Ports are usually flushed with saline solution or a medicine called heparin. The need for flushing will depend on how the port is used: If the port is only used from time to time to give medicines or draw blood, the port may need to be flushed: Before and after medicines have been given. Before and after blood has been drawn. As part of routine maintenance. Flushing may be recommended every 4-6 weeks. If a constant infusion is running, the port may not need to be flushed. Throw away any syringes in a disposal container that is meant for sharp items (sharps container). You can buy a sharps container from a pharmacy, or you can make one by using an empty hard plastic bottle with a cover. How long will my port stay implanted? The port can stay in for as long as your health care provider thinks it is needed. When it is time for the port to come out, a surgery will be done to remove it. The surgery will be similar to the procedure that was done to putthe port in. Follow these instructions at home:  Flush your port as told by your health care provider. If you need an infusion over several days, follow instructions from your health care provider about how to take   care of your port site. Make sure you: Wash your hands with soap and water before you change your dressing. If soap and water are not available, use alcohol-based hand sanitizer. Change your dressing as told by your health care provider. Place any used dressings or infusion bags into a plastic bag. Throw that bag in the trash. Keep the dressing that covers the needle clean and dry. Do not get it wet. Do not use scissors or sharp objects near the tube. Keep the tube clamped, unless it is being used. Check your port site every day for signs of infection. Check for: Redness, swelling, or  pain. Fluid or blood. Pus or a bad smell. Protect the skin around the port site. Avoid wearing bra straps that rub or irritate the site. Protect the skin around your port from seat belts. Place a soft pad over your chest if needed. Bathe or shower as told by your health care provider. The site may get wet as long as you are not actively receiving an infusion. Return to your normal activities as told by your health care provider. Ask your health care provider what activities are safe for you. Carry a medical alert card or wear a medical alert bracelet at all times. This will let health care providers know that you have an implanted port in case of an emergency. Get help right away if: You have redness, swelling, or pain at the port site. You have fluid or blood coming from your port site. You have pus or a bad smell coming from the port site. You have a fever. Summary Implanted ports are usually placed in the chest for long-term IV access. Follow instructions from your health care provider about flushing the port and changing bandages (dressings). Take care of the area around your port by avoiding clothing that puts pressure on the area, and by watching for signs of infection. Protect the skin around your port from seat belts. Place a soft pad over your chest if needed. Get help right away if you have a fever or you have redness, swelling, pain, drainage, or a bad smell at the port site. This information is not intended to replace advice given to you by your health care provider. Make sure you discuss any questions you have with your healthcare provider. Document Revised: 01/20/2020 Document Reviewed: 01/20/2020 Elsevier Patient Education  2022 Elsevier Inc.  

## 2021-03-26 NOTE — Progress Notes (Signed)
MD would prefer Aloxi/dexamethasone as premedications today, but unable to get prior authorization from patient's insurance today per Otilio Carpen, Estate manager/land agent. Will give prochlorperazine today and go over use of ondansetron at home with patient and her daughter.

## 2021-03-26 NOTE — Patient Instructions (Signed)
Angela Wilson  Discharge Instructions: Thank you for choosing [facility] to provide your oncology and hematology care.   If you have a lab appointment with the [facility], please go directly to the [facility] and check in at the registration area.  Wear comfortable clothing and clothing appropriate for easy access to any Portacath or PICC line.   We strive to give you quality time with your provider. You may need to reschedule your appointment if you arrive late (15 or more minutes).  Arriving late affects you and other patients whose appointments are after yours.  Also, if you miss three or more appointments without notifying the office, you may be dismissed from the clinic at the provider's discretion.      For prescription refill requests, have your pharmacy contact our office and allow 72 hours for refills to be completed.    Today you received the following chemotherapy and/or immunotherapy agents Gemzar      To help prevent nausea and vomiting after your treatment, we encourage you to take your nausea medication as directed.  BELOW ARE SYMPTOMS THAT SHOULD BE REPORTED IMMEDIATELY: *FEVER GREATER THAN 100.4 F (38 C) OR HIGHER *CHILLS OR SWEATING *NAUSEA AND VOMITING THAT IS NOT CONTROLLED WITH YOUR NAUSEA MEDICATION *UNUSUAL SHORTNESS OF BREATH *UNUSUAL BRUISING OR BLEEDING *URINARY PROBLEMS (pain or burning when urinating, or frequent urination) *BOWEL PROBLEMS (unusual diarrhea, constipation, pain near the anus) TENDERNESS IN MOUTH AND THROAT WITH OR WITHOUT PRESENCE OF ULCERS (sore throat, sores in mouth, or a toothache) UNUSUAL RASH, SWELLING OR PAIN  UNUSUAL VAGINAL DISCHARGE OR ITCHING   Items with * indicate a potential emergency and should be followed up as soon as possible or go to the Emergency Department if any problems should occur.  Please show the CHEMOTHERAPY ALERT CARD or IMMUNOTHERAPY ALERT CARD at check-in to the  Emergency Department and triage nurse. Should you have questions after your visit or need to cancel or reschedule your appointment, please contact [facility]  [phone] and follow the prompts.  Office hours are 8:00 a.m. to 4:30 p.m. Monday - Friday. Please note that voicemails left after 4:00 p.m. may not be returned until the following business day.  We are closed weekends and major holidays. You have access to a nurse at all times for urgent questions. Please call the main number to the clinic [phone] and follow the prompts.  For any non-urgent questions, you may also contact your provider using MyChart. We now offer e-Visits for anyone 79 and older to request care online for non-urgent symptoms. For details visit [email or web address].   Also download the MyChart app! Go to the app store, search "MyChart", open the app, select [facility], and log in with your MyChart username and password.  Due to Covid, a mask is required upon entering the hospital/clinic. If you do not have a mask, one will be given to you upon arrival. For doctor visits, patients may have 1 support person aged 76 or older with them. For treatment visits, patients cannot have anyone with them due to current Covid guidelines and our immunocompromised population. Gemcitabine injection What is this medication? GEMCITABINE (jem SYE ta been) is a chemotherapy drug. This medicine is used to treat many types of cancer like breast cancer, lung cancer, pancreatic cancer,and ovarian cancer. This medicine may be used for other purposes; ask your health care provider orpharmacist if you have questions. COMMON BRAND NAME(S):  Gemzar, Infugem What should I tell my care team before I take this medication? They need to know if you have any of these conditions: blood disorders infection kidney disease liver disease lung or breathing disease, like asthma recent or ongoing radiation therapy an unusual or  allergic reaction to gemcitabine, other chemotherapy, other medicines, foods, dyes, or preservatives pregnant or trying to get pregnant breast-feeding How should I use this medication? This drug is given as an infusion into a vein. It is administered in a hospitalor clinic by a specially trained health care professional. Talk to your pediatrician regarding the use of this medicine in children.Special care may be needed. Overdosage: If you think you have taken too much of this medicine contact apoison control center or emergency room at once. NOTE: This medicine is only for you. Do not share this medicine with others. What if I miss a dose? It is important not to miss your dose. Call your doctor or health careprofessional if you are unable to keep an appointment. What may interact with this medication? medicines to increase blood counts like filgrastim, pegfilgrastim, sargramostim some other chemotherapy drugs like cisplatin vaccines Talk to your doctor or health care professional before taking any of thesemedicines: acetaminophen aspirin ibuprofen ketoprofen naproxen This list may not describe all possible interactions. Give your health care provider a list of all the medicines, herbs, non-prescription drugs, or dietary supplements you use. Also tell them if you smoke, drink alcohol, or use illegaldrugs. Some items may interact with your medicine. What should I watch for while using this medication? Visit your doctor for checks on your progress. This drug may make you feel generally unwell. This is not uncommon, as chemotherapy can affect healthy cells as well as cancer cells. Report any side effects. Continue your course oftreatment even though you feel ill unless your doctor tells you to stop. In some cases, you may be given additional medicines to help with side effects.Follow all directions for their use. Call your doctor or health care professional for advice if you get a fever, chills  or sore throat, or other symptoms of a cold or flu. Do not treat yourself. This drug decreases your body's ability to fight infections. Try toavoid being around people who are sick. This medicine may increase your risk to bruise or bleed. Call your doctor orhealth care professional if you notice any unusual bleeding. Be careful brushing and flossing your teeth or using a toothpick because you may get an infection or bleed more easily. If you have any dental work done,tell your dentist you are receiving this medicine. Avoid taking products that contain aspirin, acetaminophen, ibuprofen, naproxen, or ketoprofen unless instructed by your doctor. These medicines may hide afever. Do not become pregnant while taking this medicine or for 6 months after stopping it. Women should inform their doctor if they wish to become pregnant or think they might be pregnant. Men should not father a child while taking this medicine and for 3 months after stopping it. There is a potential for serious side effects to an unborn child. Talk to your health care professional or pharmacist for more information. Do not breast-feed an infant while takingthis medicine or for at least 1 week after stopping it. Men should inform their doctors if they wish to father a child. This medicine may lower sperm counts. Talk with your doctor or health care professional ifyou are concerned about your fertility. What side effects may I notice from receiving this medication? Side effects that you  should report to your doctor or health care professionalas soon as possible: allergic reactions like skin rash, itching or hives, swelling of the face, lips, or tongue breathing problems pain, redness, or irritation at site where injected signs and symptoms of a dangerous change in heartbeat or heart rhythm like chest pain; dizziness; fast or irregular heartbeat; palpitations; feeling faint or lightheaded, falls; breathing problems signs of decreased  platelets or bleeding - bruising, pinpoint red spots on the skin, black, tarry stools, blood in the urine signs of decreased red blood cells - unusually weak or tired, feeling faint or lightheaded, falls signs of infection - fever or chills, cough, sore throat, pain or difficulty passing urine signs and symptoms of kidney injury like trouble passing urine or change in the amount of urine signs and symptoms of liver injury like dark yellow or brown urine; general ill feeling or flu-like symptoms; light-colored stools; loss of appetite; nausea; right upper belly pain; unusually weak or tired; yellowing of the eyes or skin swelling of ankles, feet, hands Side effects that usually do not require medical attention (report to yourdoctor or health care professional if they continue or are bothersome): constipation diarrhea hair loss loss of appetite nausea rash vomiting This list may not describe all possible side effects. Call your doctor for medical advice about side effects. You may report side effects to FDA at1-800-FDA-1088. Where should I keep my medication? This drug is given in a hospital or clinic and will not be stored at home. NOTE: This sheet is a summary. It may not cover all possible information. If you have questions about this medicine, talk to your doctor, pharmacist, orhealth care provider.  2022 Elsevier/Gold Standard (2017-11-29 18:06:11)

## 2021-03-26 NOTE — Progress Notes (Signed)
Spoke with Angela Wilson and her daughter, Angela Wilson, in treatment room. Angela Wilson is doing well, but still struggles with nausea after treatment. Their religious holiday is tomorrow and Angela Wilson is disappointed that her mom may feel poorly, but she understands why the appointment could not be rescheduled.  Oncology Nurse Navigator Documentation  Oncology Nurse Navigator Flowsheets 03/26/2021  Confirmed Diagnosis Date -  Diagnosis Status -  Planned Course of Treatment -  Phase of Treatment -  Chemotherapy Pending- Reason: -  Chemotherapy Actual Start Date: -  Navigator Follow Up Date: 04/15/2021  Navigator Follow Up Reason: Follow-up Appointment;Chemotherapy  Navigator Location [facility]  Referral Date to RadOnc/MedOnc -  Navigator Encounter Type Treatment;Appt/Treatment Plan Review  Telephone -  Treatment Initiated Date -  Patient Visit Type MedOnc  Treatment Phase Active Tx  Barriers/Navigation Needs Coordination of Care;Cultural Needs;Family Concerns;Language/Communication;Education  Education Pain/ Symptom Management  Interventions Education;Psycho-Social Support  Acuity Level 2-Minimal Needs (1-2 Barriers Identified)  Referrals -  Coordination of Care -  Education Method Verbal  Support Groups/Services Friends and Family  Time Spent with Patient 60

## 2021-03-27 ENCOUNTER — Encounter: Payer: Self-pay | Admitting: Hematology & Oncology

## 2021-03-27 LAB — CA 125: Cancer Antigen (CA) 125: 448 U/mL — ABNORMAL HIGH (ref 0.0–38.1)

## 2021-03-27 NOTE — Progress Notes (Signed)
Hematology and Oncology Follow Up Visit  Angela Wilson [id number] Oct 03, 1965 55 y.o. 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 is here today with her daughter for follow-up.  She is about the same.  She has had 1 full cycle of treatment so far.    She still is having this lower abdominal pain.  Is not cause any problems with vomiting.  She has had a little bit of nausea.  Apparently this is a big holiday for her and her family.  I want to try to make sure that she enjoys the holiday and not get sick.  I will see what we can do about ordering some IV antiemetics while she is getting treated today.  Hopefully this will help her over the weekend so she can enjoy a holiday feast.  She has had no bleeding.  There has been no diarrhea.  She has had no rashes.  She has had little bit of leg swelling.  Her last tumor markers are still on the higher side.  The CEA was 33.  The CA-125 was 448.  Is still not willing to give up on this protocol.  Hopefully, will start to see these improve as she takes more treatment.  She has had no mouth sores.  There is no cough.  Overall, her performance status is ECOG 1.  .  Medications:  Allergies as of 03/26/2021       Reactions   Iodinated Diagnostic Agents Itching, Rash   Ivp Dye [iodinated Diagnostic Agents] Itching, Rash   Morphine And Related Hives   Penicillins Rash        Medication List        Accurate as of March 26, 2021 11:59 PM. If you have any questions, ask your nurse or doctor.          STOP taking these medications    aspirin 81 MG chewable tablet Stopped  by: Volanda Napoleon, MD   omeprazole 40 MG capsule Commonly known as: PRILOSEC Stopped by: Volanda Napoleon, MD       TAKE these medications    albuterol 108 (90 Base) MCG/ACT inhaler Commonly known as: VENTOLIN HFA Inhale 2 puffs into the lungs every 4 (four) hours as needed for wheezing or shortness of breath.   amLODipine 5 MG tablet Commonly known as: NORVASC Take 1 tablet (5 mg total) by mouth daily.   capecitabine 500 MG tablet Commonly known as: Xeloda Take 3 tablets (1,500 mg total) by mouth 2 (two) times daily after a meal. Take for 14 days on, 7 days off. Repeat every 21 days.   cetirizine 10 MG tablet Commonly known as: ZYRTEC Take 1 tablet (10 mg total) by mouth daily.   Creon 36000 UNITS Cpep capsule Generic drug: lipase/protease/amylase   dexamethasone 4 MG tablet Commonly known as: DECADRON Take 1 tablet (4 mg total) by mouth 2 (two) times daily with a meal. For three (3) days following chemotherapy. Started by: Tildon Husky, RN   dronabinol 2.5 MG capsule Commonly known as: MARINOL Take 1 capsule (2.5 mg total) by mouth 2 (two) times  daily before lunch and supper.   ergocalciferol 1.25 MG (50000 UT) capsule Commonly known as: VITAMIN D2 Take 1 capsule (50,000 Units total) by mouth once a week.   fentaNYL 75 MCG/HR Commonly known as: [place] 1 patch onto the skin every other day.   gabapentin 300 MG capsule Commonly known as: NEURONTIN TAKE 2 CAPSULES BY MOUTH IN THE DAYTIME AND 2 CAPSULES AT BEDTIME   glipiZIDE 10 MG tablet Commonly known as: GLUCOTROL Take 1 tablet (10 mg total) by mouth 2 (two) times daily.   Hycodan 5-1.5 MG/5ML syrup Generic drug: HYDROcodone bit-homatropine Take 5 mLs by mouth every 6 (six) hours as needed for cough.   lidocaine-prilocaine cream Commonly known as: EMLA APPLY TO PORT 1 HOUR BEFORE ACCESS.   lisinopril 10 MG tablet Commonly known as: ZESTRIL Take 1 tablet (10 mg total) by mouth daily.    LORazepam 0.5 MG tablet Commonly known as: ATIVAN Take 0.5 mg every six hours as needed for nausea.   magic mouthwash w/lidocaine Soln Take 5 mLs by mouth 3 (three) times daily as needed for mouth pain.   metFORMIN 500 MG 24 hr tablet Commonly known as: Glucophage XR Take 1 tablet (500 mg total) by mouth daily with breakfast.   ondansetron 4 MG tablet Commonly known as: ZOFRAN Take 1 tablet (4 mg total) by mouth every 6 (six) hours as needed for nausea.   oxyCODONE 5 MG immediate release tablet Commonly known as: Oxy IR/ROXICODONE Take 1 tablet (5 mg total) by mouth every 4 (four) hours as needed for severe pain.   PHENobarbital-Belladonna Alk 16.2 MG/5ML Elix TAKE 5 MLS (16.2 MG TOTAL) BY MOUTH 4 (FOUR) TIMES DAILY.   polyethylene glycol 17 g packet Commonly known as: MIRALAX / GLYCOLAX Take 17 g by mouth daily as needed.   prochlorperazine 10 MG tablet Commonly known as: COMPAZINE Take 1 tablet (10 mg total) by mouth every 6 (six) hours as needed for nausea or vomiting.   vitamin B-6 250 MG tablet Take 1 tablet (250 mg total) by mouth daily.        Allergies:  Allergies  Allergen Reactions   Iodinated Diagnostic Agents Itching and Rash   Ivp Dye [Iodinated Diagnostic Agents] Itching and Rash   Morphine And Related Hives   Penicillins Rash    Past Medical History, Surgical history, Social history, and Family History were reviewed and updated.  Review of Systems: All other 10 point review of systems is negative.   Physical Exam:  weight is 205 lb (93 kg). Her oral temperature is 98.3 F (36.8 C). Her blood pressure is 93/55 (abnormal) and her pulse is 90. Her respiration is 18 and oxygen saturation is 100%.   Wt Readings from Last 3 Encounters:  03/26/21 205 lb (93 kg)  03/04/21 202 lb (91.6 kg)  02/17/21 202 lb (91.6 kg)    Ocular: Sclerae unicteric, pupils equal, round and reactive to light Ear-nose-throat: Oropharynx clear, dentition fair Lymphatic:  No cervical or supraclavicular adenopathy Lungs no rales or rhonchi, good excursion bilaterally Heart regular rate and rhythm, no murmur appreciated Abd soft, mild tenderness on exam of lower abdomen, positive bowel sounds, No organomegaly noted MSK no focal spinal tenderness, no joint edema Neuro: non-focal, well-oriented, appropriate affect Breasts: Deferred   Lab Results  Component Value Date   WBC 5.4 03/26/2021   HGB 11.5 (L) 03/26/2021   HCT 35.5 (L) 03/26/2021   MCV 86.8 03/26/2021   PLT 245 03/26/2021   Lab Results  Component Value Date   FERRITIN 284 06/15/2020   IRON 55 06/15/2020   TIBC 323 06/15/2020   UIBC 268 06/15/2020   IRONPCTSAT 17 (L) 06/15/2020   Lab Results  Component Value Date   RETICCTPCT 5.7 (H) 06/15/2020   RBC 4.09 03/26/2021   No results found for: KPAFRELGTCHN, LAMBDASER, KAPLAMBRATIO No results found for: IGGSERUM, IGA, IGMSERUM No results found for: Odetta Pink, SPEI   Chemistry      Component Value Date/Time   NA 137 03/26/2021 0801   NA 136 01/07/2020 1256   K 4.1 03/26/2021 0801   CL 102 03/26/2021 0801   CO2 27 03/26/2021 0801   BUN 16 03/26/2021 0801   BUN 10 01/07/2020 1256   CREATININE 0.61 03/26/2021 0801      Component Value Date/Time   CALCIUM 9.7 03/26/2021 0801   ALKPHOS 49 03/26/2021 0801   AST 16 03/26/2021 0801   ALT 11 03/26/2021 0801   BILITOT 0.6 03/26/2021 0801       Impression and Plan: [name] is a very pleasant 55 yo [name] female with metastatic pancreatic cancer.   Again, I just think that we have to stick with this protocol for a couple more cycles and see how her tumor markers trend.  She actually has been pretty well from my point of view.  I am happy that her quality of life is doing pretty well right now.  Again this is a big holiday weekend for her.  Hopefully, she will be able to enjoy the holiday feast that we will be  prepared.  I will plan to get her back to see [place] in another 3 weeks.  They will be the start of her third cycle.  We will see how her tumor markers are at that time.    Volanda Napoleon, MD 7/9/202211:21 AM

## 2021-03-29 ENCOUNTER — Encounter: Payer: Self-pay | Admitting: *Deleted

## 2021-03-30 ENCOUNTER — Other Ambulatory Visit: Payer: Self-pay | Admitting: *Deleted

## 2021-03-30 DIAGNOSIS — C252 Malignant neoplasm of tail of pancreas: Secondary | ICD-10-CM

## 2021-03-30 MED ORDER — CAPECITABINE 500 MG PO TABS: 830.0000 mg/m2 | ORAL_TABLET | Freq: Two times a day (BID) | ORAL | 6 refills | Status: DC

## 2021-03-31 ENCOUNTER — Other Ambulatory Visit: Payer: Self-pay | Admitting: *Deleted

## 2021-03-31 DIAGNOSIS — C252 Malignant neoplasm of tail of pancreas: Secondary | ICD-10-CM

## 2021-03-31 DIAGNOSIS — C251 Malignant neoplasm of body of pancreas: Secondary | ICD-10-CM

## 2021-04-01 ENCOUNTER — Other Ambulatory Visit: Payer: Self-pay | Admitting: *Deleted

## 2021-04-01 ENCOUNTER — Inpatient Hospital Stay: Payer: 59

## 2021-04-01 ENCOUNTER — Telehealth: Payer: Self-pay | Admitting: *Deleted

## 2021-04-01 ENCOUNTER — Other Ambulatory Visit: Payer: Self-pay

## 2021-04-01 ENCOUNTER — Other Ambulatory Visit (HOSPITAL_BASED_OUTPATIENT_CLINIC_OR_DEPARTMENT_OTHER): Payer: Self-pay

## 2021-04-01 ENCOUNTER — Other Ambulatory Visit: Payer: Self-pay | Admitting: Family

## 2021-04-01 VITALS — BP 102/68 | HR 99 | Temp 97.9°F | Resp 17

## 2021-04-01 DIAGNOSIS — C252 Malignant neoplasm of tail of pancreas: Secondary | ICD-10-CM

## 2021-04-01 DIAGNOSIS — Z5111 Encounter for antineoplastic chemotherapy: Secondary | ICD-10-CM | POA: Diagnosis not present

## 2021-04-01 DIAGNOSIS — C251 Malignant neoplasm of body of pancreas: Secondary | ICD-10-CM

## 2021-04-01 LAB — CBC WITH DIFFERENTIAL (CANCER CENTER ONLY)
Abs Immature Granulocytes: 0.03 10*3/uL (ref 0.00–0.07)
Basophils Absolute: 0 10*3/uL (ref 0.0–0.1)
Basophils Relative: 1 %
Eosinophils Absolute: 0.1 10*3/uL (ref 0.0–0.5)
Eosinophils Relative: 2 %
HCT: 37.3 % (ref 36.0–46.0)
Hemoglobin: 12 g/dL (ref 12.0–15.0)
Immature Granulocytes: 1 %
Lymphocytes Relative: 59 %
Lymphs Abs: 3.3 10*3/uL (ref 0.7–4.0)
MCH: 28 pg (ref 26.0–34.0)
MCHC: 32.2 g/dL (ref 30.0–36.0)
MCV: 86.9 fL (ref 80.0–100.0)
Monocytes Absolute: 0.3 10*3/uL (ref 0.1–1.0)
Monocytes Relative: 6 %
Neutro Abs: 1.7 10*3/uL (ref 1.7–7.7)
Neutrophils Relative %: 31 %
Platelet Count: 160 10*3/uL (ref 150–400)
RBC: 4.29 MIL/uL (ref 3.87–5.11)
RDW: 17.1 % — ABNORMAL HIGH (ref 11.5–15.5)
WBC Count: 5.5 10*3/uL (ref 4.0–10.5)
nRBC: 0 % (ref 0.0–0.2)

## 2021-04-01 LAB — CMP (CANCER CENTER ONLY)
ALT: 21 U/L (ref 0–44)
AST: 16 U/L (ref 15–41)
Albumin: 3.7 g/dL (ref 3.5–5.0)
Alkaline Phosphatase: 49 U/L (ref 38–126)
Anion gap: 8 (ref 5–15)
BUN: 21 mg/dL — ABNORMAL HIGH (ref 6–20)
CO2: 28 mmol/L (ref 22–32)
Calcium: 9.6 mg/dL (ref 8.9–10.3)
Chloride: 101 mmol/L (ref 98–111)
Creatinine: 0.62 mg/dL (ref 0.44–1.00)
GFR, Estimated: >60 mL/min (ref >60)
Glucose, Bld: 106 mg/dL — ABNORMAL HIGH (ref 70–99)
Potassium: 4.1 mmol/L (ref 3.5–5.1)
Sodium: 137 mmol/L (ref 135–145)
Total Bilirubin: 0.5 mg/dL (ref 0.3–1.2)
Total Protein: 6.8 g/dL (ref 6.5–8.1)

## 2021-04-01 MED ORDER — FENTANYL 75 MCG/HR TD PT72
1.0000 | MEDICATED_PATCH | TRANSDERMAL | 0 refills | Status: DC
  Filled 2021-04-01 (×3): qty 15, 30d supply, fill #0

## 2021-04-01 MED ORDER — SODIUM CHLORIDE 0.9% FLUSH
10.0000 mL | INTRAVENOUS | Status: DC | PRN
  Administered 2021-04-01: 10 mL
  Filled 2021-04-01: qty 10

## 2021-04-01 MED ORDER — HEPARIN SOD (PORK) LOCK FLUSH 100 UNIT/ML IV SOLN
500.0000 [IU] | Freq: Once | INTRAVENOUS | Status: AC | PRN
  Administered 2021-04-01: 500 [IU]
  Filled 2021-04-01: qty 5

## 2021-04-01 MED ORDER — PROCHLORPERAZINE MALEATE 10 MG PO TABS
ORAL_TABLET | ORAL | Status: AC
  Filled 2021-04-01: qty 1

## 2021-04-01 MED ORDER — SODIUM CHLORIDE 0.9 % IV SOLN
Freq: Once | INTRAVENOUS | Status: AC
  Filled 2021-04-01: qty 250

## 2021-04-01 MED ORDER — FENTANYL 75 MCG/HR TD PT72: 1.0000 | MEDICATED_PATCH | TRANSDERMAL | 0 refills | Status: DC

## 2021-04-01 MED ORDER — PROCHLORPERAZINE MALEATE 10 MG PO TABS
10.0000 mg | ORAL_TABLET | Freq: Once | ORAL | Status: AC
Start: 2021-04-01 — End: 2021-04-01
  Administered 2021-04-01: 10 mg via ORAL

## 2021-04-01 MED ORDER — SODIUM CHLORIDE 0.9 % IV SOLN
1800.0000 mg | Freq: Once | INTRAVENOUS | Status: AC
  Administered 2021-04-01: 1800 mg via INTRAVENOUS
  Filled 2021-04-01: qty 26.3

## 2021-04-01 NOTE — Progress Notes (Signed)
Spoke with pharmacist at [facility], [address]. They had patient's name super imposed, but did eventually fine script, but they are out of 75 mg fentanyl patches. Unable to get them until Monday. Son-in-law states they are out and need a script now. Lorriane Shire will check to see if we can fill it today at pharmacy downstairs.

## 2021-04-01 NOTE — Telephone Encounter (Signed)
Error

## 2021-04-01 NOTE — Patient Instructions (Signed)
Implanted Port Home Guide An implanted port is a device that is placed under the skin. It is usually placed in the chest. The device can be used to give IV medicine, to take blood, or for dialysis. You may have an implanted port if: You need IV medicine that would be irritating to the small veins in your hands or arms. You need IV medicines, such as antibiotics, for a long period of time. You need IV nutrition for a long period of time. You need dialysis. When you have a port, your health care provider can choose to use the port instead of veins in your arms for these procedures. You may have fewer limitations when using a port than you would if you used other types of long-term IVs, and you will likely be able to return to normal activities afteryour incision heals. An implanted port has two main parts: Reservoir. The reservoir is the part where a needle is inserted to give medicines or draw blood. The reservoir is round. After it is placed, it appears as a small, raised area under your skin. Catheter. The catheter is a thin, flexible tube that connects the reservoir to a vein. Medicine that is inserted into the reservoir goes into the catheter and then into the vein. How is my port accessed? To access your port: A numbing cream may be placed on the skin over the port site. Your health care provider will put on a mask and sterile gloves. The skin over your port will be cleaned carefully with a germ-killing soap and allowed to dry. Your health care provider will gently pinch the port and insert a needle into it. Your health care provider will check for a blood return to make sure the port is in the vein and is not clogged. If your port needs to remain accessed to get medicine continuously (constant infusion), your health care provider will place a clear bandage (dressing) over the needle site. The dressing and needle will need to be changed every week, or as told by your health care provider. What  is flushing? Flushing helps keep the port from getting clogged. Follow instructions from your health care provider about how and when to flush the port. Ports are usually flushed with saline solution or a medicine called heparin. The need for flushing will depend on how the port is used: If the port is only used from time to time to give medicines or draw blood, the port may need to be flushed: Before and after medicines have been given. Before and after blood has been drawn. As part of routine maintenance. Flushing may be recommended every 4-6 weeks. If a constant infusion is running, the port may not need to be flushed. Throw away any syringes in a disposal container that is meant for sharp items (sharps container). You can buy a sharps container from a pharmacy, or you can make one by using an empty hard plastic bottle with a cover. How long will my port stay implanted? The port can stay in for as long as your health care provider thinks it is needed. When it is time for the port to come out, a surgery will be done to remove it. The surgery will be similar to the procedure that was done to putthe port in. Follow these instructions at home:  Flush your port as told by your health care provider. If you need an infusion over several days, follow instructions from your health care provider about how to take   care of your port site. Make sure you: Wash your hands with soap and water before you change your dressing. If soap and water are not available, use alcohol-based hand sanitizer. Change your dressing as told by your health care provider. Place any used dressings or infusion bags into a plastic bag. Throw that bag in the trash. Keep the dressing that covers the needle clean and dry. Do not get it wet. Do not use scissors or sharp objects near the tube. Keep the tube clamped, unless it is being used. Check your port site every day for signs of infection. Check for: Redness, swelling, or  pain. Fluid or blood. Pus or a bad smell. Protect the skin around the port site. Avoid wearing bra straps that rub or irritate the site. Protect the skin around your port from seat belts. Place a soft pad over your chest if needed. Bathe or shower as told by your health care provider. The site may get wet as long as you are not actively receiving an infusion. Return to your normal activities as told by your health care provider. Ask your health care provider what activities are safe for you. Carry a medical alert card or wear a medical alert bracelet at all times. This will let health care providers know that you have an implanted port in case of an emergency. Get help right away if: You have redness, swelling, or pain at the port site. You have fluid or blood coming from your port site. You have pus or a bad smell coming from the port site. You have a fever. Summary Implanted ports are usually placed in the chest for long-term IV access. Follow instructions from your health care provider about flushing the port and changing bandages (dressings). Take care of the area around your port by avoiding clothing that puts pressure on the area, and by watching for signs of infection. Protect the skin around your port from seat belts. Place a soft pad over your chest if needed. Get help right away if you have a fever or you have redness, swelling, pain, drainage, or a bad smell at the port site. This information is not intended to replace advice given to you by your health care provider. Make sure you discuss any questions you have with your healthcare provider. Document Revised: 01/20/2020 Document Reviewed: 01/20/2020 Elsevier Patient Education  2022 Elsevier Inc.  

## 2021-04-01 NOTE — Patient Instructions (Signed)
[facility] [place]  Discharge Instructions: Thank you for choosing [facility] to provide your oncology and hematology care.   If you have a lab appointment with the [facility], please go directly to the [facility] and check in at the registration area.  Wear comfortable clothing and clothing appropriate for easy access to any Portacath or PICC line.   We strive to give you quality time with your provider. You may need to reschedule your appointment if you arrive late (15 or more minutes).  Arriving late affects you and other patients whose appointments are after yours.  Also, if you miss three or more appointments without notifying the office, you may be dismissed from the clinic at the provider's discretion.      For prescription refill requests, have your pharmacy contact our office and allow 72 hours for refills to be completed.    Today you received the following chemotherapy and/or immunotherapy agents gemzar     To help prevent nausea and vomiting after your treatment, we encourage you to take your nausea medication as directed.  BELOW ARE SYMPTOMS THAT SHOULD BE REPORTED IMMEDIATELY: *FEVER GREATER THAN 100.4 F (38 C) OR HIGHER *CHILLS OR SWEATING *NAUSEA AND VOMITING THAT IS NOT CONTROLLED WITH YOUR NAUSEA MEDICATION *UNUSUAL SHORTNESS OF BREATH *UNUSUAL BRUISING OR BLEEDING *URINARY PROBLEMS (pain or burning when urinating, or frequent urination) *BOWEL PROBLEMS (unusual diarrhea, constipation, pain near the anus) TENDERNESS IN MOUTH AND THROAT WITH OR WITHOUT PRESENCE OF ULCERS (sore throat, sores in mouth, or a toothache) UNUSUAL RASH, SWELLING OR PAIN  UNUSUAL VAGINAL DISCHARGE OR ITCHING   Items with * indicate a potential emergency and should be followed up as soon as possible or go to the Emergency Department if any problems should occur.  Please show the CHEMOTHERAPY ALERT CARD or IMMUNOTHERAPY ALERT CARD at check-in to the  Emergency Department and triage nurse. Should you have questions after your visit or need to cancel or reschedule your appointment, please contact [facility]  [phone] and follow the prompts.  Office hours are 8:00 a.m. to 4:30 p.m. Monday - Friday. Please note that voicemails left after 4:00 p.m. may not be returned until the following business day.  We are closed weekends and major holidays. You have access to a nurse at all times for urgent questions. Please call the main number to the clinic [phone] and follow the prompts.  For any non-urgent questions, you may also contact your provider using MyChart. We now offer e-Visits for anyone 41 and older to request care online for non-urgent symptoms. For details visit [email or web address].   Also download the MyChart app! Go to the app store, search "MyChart", open the app, select [facility], and log in with your MyChart username and password.  Due to Covid, a mask is required upon entering the hospital/clinic. If you do not have a mask, one will be given to you upon arrival. For doctor visits, patients may have 1 support person aged 53 or older with them. For treatment visits, patients cannot have anyone with them due to current Covid guidelines and our immunocompromised population.

## 2021-04-06 ENCOUNTER — Encounter: Payer: Self-pay | Admitting: *Deleted

## 2021-04-12 ENCOUNTER — Encounter: Payer: Self-pay | Admitting: *Deleted

## 2021-04-13 ENCOUNTER — Encounter: Payer: Self-pay | Admitting: *Deleted

## 2021-04-13 NOTE — Progress Notes (Signed)
Oncology Nurse Navigator Documentation  Oncology Nurse Navigator Flowsheets 04/13/2021  Confirmed Diagnosis Date -  Diagnosis Status -  Planned Course of Treatment -  Phase of Treatment -  Chemotherapy Pending- Reason: -  Chemotherapy Actual Start Date: -  Navigator Follow Up Date: 04/15/2021  Navigator Follow Up Reason: Follow-up Appointment;Chemotherapy  Navigator Location [facility]  Referral Date to RadOnc/MedOnc -  Navigator Encounter Type MyChart;Letter/Fax/Email  Telephone -  Treatment Initiated Date -  Patient Visit Type MedOnc  Treatment Phase Active Tx  Barriers/Navigation Needs Coordination of Care;Cultural Needs;Family Concerns;Language/Communication;Education  Education -  Interventions Other  Acuity Level 2-Minimal Needs (1-2 Barriers Identified)  Referrals -  Coordination of Care -  Education Method Verbal  Support Groups/Services -  Time Spent with Patient 30

## 2021-04-15 ENCOUNTER — Encounter: Payer: Self-pay | Admitting: *Deleted

## 2021-04-15 ENCOUNTER — Encounter: Payer: Self-pay | Admitting: Hematology & Oncology

## 2021-04-15 ENCOUNTER — Other Ambulatory Visit: Payer: Self-pay

## 2021-04-15 ENCOUNTER — Inpatient Hospital Stay (HOSPITAL_BASED_OUTPATIENT_CLINIC_OR_DEPARTMENT_OTHER): Payer: 59 | Admitting: Hematology & Oncology

## 2021-04-15 ENCOUNTER — Other Ambulatory Visit (HOSPITAL_BASED_OUTPATIENT_CLINIC_OR_DEPARTMENT_OTHER): Payer: Self-pay

## 2021-04-15 ENCOUNTER — Inpatient Hospital Stay: Payer: 59

## 2021-04-15 VITALS — HR 92

## 2021-04-15 VITALS — BP 103/52 | HR 109 | Temp 97.9°F | Resp 17 | Wt 207.1 lb

## 2021-04-15 DIAGNOSIS — C251 Malignant neoplasm of body of pancreas: Secondary | ICD-10-CM

## 2021-04-15 DIAGNOSIS — C252 Malignant neoplasm of tail of pancreas: Secondary | ICD-10-CM

## 2021-04-15 DIAGNOSIS — Z5111 Encounter for antineoplastic chemotherapy: Secondary | ICD-10-CM | POA: Diagnosis not present

## 2021-04-15 LAB — CMP (CANCER CENTER ONLY)
ALT: 11 U/L (ref 0–44)
AST: 15 U/L (ref 15–41)
Albumin: 3.7 g/dL (ref 3.5–5.0)
Alkaline Phosphatase: 53 U/L (ref 38–126)
Anion gap: 7 (ref 5–15)
BUN: 14 mg/dL (ref 6–20)
CO2: 28 mmol/L (ref 22–32)
Calcium: 9.8 mg/dL (ref 8.9–10.3)
Chloride: 101 mmol/L (ref 98–111)
Creatinine: 0.6 mg/dL (ref 0.44–1.00)
GFR, Estimated: >60 mL/min (ref >60)
Glucose, Bld: 102 mg/dL — ABNORMAL HIGH (ref 70–99)
Potassium: 4.1 mmol/L (ref 3.5–5.1)
Sodium: 136 mmol/L (ref 135–145)
Total Bilirubin: 0.4 mg/dL (ref 0.3–1.2)
Total Protein: 7.1 g/dL (ref 6.5–8.1)

## 2021-04-15 LAB — CBC WITH DIFFERENTIAL (CANCER CENTER ONLY)
Abs Immature Granulocytes: 0.08 10*3/uL — ABNORMAL HIGH (ref 0.00–0.07)
Basophils Absolute: 0 10*3/uL (ref 0.0–0.1)
Basophils Relative: 0 %
Eosinophils Absolute: 0.2 10*3/uL (ref 0.0–0.5)
Eosinophils Relative: 3 %
HCT: 33.1 % — ABNORMAL LOW (ref 36.0–46.0)
Hemoglobin: 10.6 g/dL — ABNORMAL LOW (ref 12.0–15.0)
Immature Granulocytes: 2 %
Lymphocytes Relative: 37 %
Lymphs Abs: 1.9 10*3/uL (ref 0.7–4.0)
MCH: 29.4 pg (ref 26.0–34.0)
MCHC: 32 g/dL (ref 30.0–36.0)
MCV: 91.7 fL (ref 80.0–100.0)
Monocytes Absolute: 0.6 10*3/uL (ref 0.1–1.0)
Monocytes Relative: 11 %
Neutro Abs: 2.5 10*3/uL (ref 1.7–7.7)
Neutrophils Relative %: 47 %
Platelet Count: 362 10*3/uL (ref 150–400)
RBC: 3.61 MIL/uL — ABNORMAL LOW (ref 3.87–5.11)
RDW: 20.2 % — ABNORMAL HIGH (ref 11.5–15.5)
WBC Count: 5.3 10*3/uL (ref 4.0–10.5)
nRBC: 0 % (ref 0.0–0.2)

## 2021-04-15 LAB — CEA (IN HOUSE-CHCC): CEA (CHCC-In House): 47.93 ng/mL — ABNORMAL HIGH (ref 0.00–5.00)

## 2021-04-15 MED ORDER — HEPARIN SOD (PORK) LOCK FLUSH 100 UNIT/ML IV SOLN
500.0000 [IU] | Freq: Once | INTRAVENOUS | Status: AC | PRN
  Administered 2021-04-15: 500 [IU]
  Filled 2021-04-15: qty 5

## 2021-04-15 MED ORDER — PROCHLORPERAZINE MALEATE 10 MG PO TABS
10.0000 mg | ORAL_TABLET | Freq: Once | ORAL | Status: AC
  Administered 2021-04-15: 10 mg via ORAL

## 2021-04-15 MED ORDER — PB-HYOSCY-ATROPINE-SCOPOLAMINE 16.2 MG/5ML PO ELIX
5.0000 mL | ORAL_SOLUTION | Freq: Four times a day (QID) | ORAL | 2 refills | Status: DC
  Filled 2021-04-15: qty 600, 30d supply, fill #0

## 2021-04-15 MED ORDER — PROCHLORPERAZINE MALEATE 10 MG PO TABS
ORAL_TABLET | ORAL | Status: AC
  Filled 2021-04-15: qty 1

## 2021-04-15 MED ORDER — SODIUM CHLORIDE 0.9 % IV SOLN
Freq: Once | INTRAVENOUS | Status: AC
  Filled 2021-04-15: qty 250

## 2021-04-15 MED ORDER — SODIUM CHLORIDE 0.9% FLUSH
10.0000 mL | INTRAVENOUS | Status: DC | PRN
  Administered 2021-04-15: 10 mL
  Filled 2021-04-15: qty 10

## 2021-04-15 MED ORDER — SODIUM CHLORIDE 0.9 % IV SOLN
900.0000 mg/m2 | Freq: Once | INTRAVENOUS | Status: AC
  Administered 2021-04-15: 1824 mg via INTRAVENOUS
  Filled 2021-04-15: qty 47.97

## 2021-04-15 NOTE — Progress Notes (Signed)
Hematology and Oncology Follow Up Visit  Angela Wilson [id number] Nov 16, 1965 55 y.o. 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  As always, she is having some abdominal pain.  I just think that she will always have abdominal pain because I do think that she will always have this malignancy causing pain.  We try to call in some Donnatal for her.  I thought she may have some element of spasms.  Unfortunately, I suspect she will not be able to get the Donnatal because of cost.  She did have a wonderful holiday with her family.  She was post to celebrate after we last saw her.  She was able to eat well.  She had no nausea or vomiting.  She has had no fever..  She has had some pain in the right neck.  This is where she has the Port-A-Cath.  I am not sure exactly what might be going on with this.  We probably will have to get some kind of dye study to see what is going on.  Her last tumor markers were still elevated.  The CA-125 was 448. The CEA was 33.  I told her family that we really have to be patient.  Sometimes, with the Xeloda/Gemzar combination, it can take 3 or 4 cycles before we see a response.  I think when the issues that we have is that there really are very few options for her.  I am going to try to get a biopsy.  I am going to see if we cannot send off molecular studies.  She has had no problems with diarrhea.  She has had no bleeding.  There is been no leg swelling.  She has had no cough or shortness of breath.  Overall, performance status is ECOG 1.   Medications:  Allergies  as of 04/15/2021       Reactions   Iodinated Diagnostic Agents Itching, Rash   Ivp Dye [iodinated Diagnostic Agents] Itching, Rash   Morphine And Related Hives   Penicillins Rash        Medication List        Accurate as of April 15, 2021  9:45 AM. If you have any questions, ask your nurse or doctor.          albuterol 108 (90 Base) MCG/ACT inhaler Commonly known as: VENTOLIN HFA Inhale 2 puffs into the lungs every 4 (four) hours as needed for wheezing or shortness of breath.   amLODipine 5 MG tablet Commonly known as: NORVASC Take 1 tablet (5 mg total) by mouth daily.   capecitabine 500 MG tablet Commonly known as: Xeloda Take 3 tablets (1,500 mg total) by mouth 2 (two) times daily after a meal. Take for 14 days on, 7 days off. Repeat every 21 days.   cetirizine 10 MG tablet Commonly known as: ZYRTEC Take 1 tablet (10 mg total) by mouth daily.   Creon 36000 UNITS Cpep capsule Generic drug: lipase/protease/amylase   dexamethasone 4 MG tablet Commonly known as: DECADRON Take 1 tablet (4 mg total) by mouth 2 (two)  times daily with a meal. For three (3) days following chemotherapy.   dronabinol 2.5 MG capsule Commonly known as: MARINOL Take 1 capsule (2.5 mg total) by mouth 2 (two) times daily before lunch and supper.   ergocalciferol 1.25 MG (50000 UT) capsule Commonly known as: VITAMIN D2 Take 1 capsule (50,000 Units total) by mouth once a week.   fentaNYL 75 MCG/HR Commonly known as: Tecolotito 1 patch onto the skin every other day.   gabapentin 300 MG capsule Commonly known as: NEURONTIN TAKE 2 CAPSULES BY MOUTH IN THE DAYTIME AND 2 CAPSULES AT BEDTIME   glipiZIDE 10 MG tablet Commonly known as: GLUCOTROL Take 1 tablet (10 mg total) by mouth 2 (two) times daily.   Hycodan 5-1.5 MG/5ML syrup Generic drug: HYDROcodone bit-homatropine Take 5 mLs by mouth every 6 (six) hours as needed for cough.   lidocaine-prilocaine cream Commonly known as:  EMLA APPLY TO PORT 1 HOUR BEFORE ACCESS.   lisinopril 10 MG tablet Commonly known as: ZESTRIL Take 1 tablet (10 mg total) by mouth daily.   LORazepam 0.5 MG tablet Commonly known as: ATIVAN Take 0.5 mg every six hours as needed for nausea.   magic mouthwash w/lidocaine Soln Take 5 mLs by mouth 3 (three) times daily as needed for mouth pain.   metFORMIN 500 MG 24 hr tablet Commonly known as: Glucophage XR Take 1 tablet (500 mg total) by mouth daily with breakfast.   ondansetron 4 MG tablet Commonly known as: ZOFRAN Take 1 tablet (4 mg total) by mouth every 6 (six) hours as needed for nausea.   oxyCODONE 5 MG immediate release tablet Commonly known as: Oxy IR/ROXICODONE Take 1 tablet (5 mg total) by mouth every 4 (four) hours as needed for severe pain.   PHENobarbital-Belladonna Alk 16.2 MG/5ML Elix TAKE 5 MLS (16.2 MG TOTAL) BY MOUTH 4 (FOUR) TIMES DAILY.   polyethylene glycol 17 g packet Commonly known as: MIRALAX / GLYCOLAX Take 17 g by mouth daily as needed.   prochlorperazine 10 MG tablet Commonly known as: COMPAZINE Take 1 tablet (10 mg total) by mouth every 6 (six) hours as needed for nausea or vomiting.   vitamin B-6 250 MG tablet Take 1 tablet (250 mg total) by mouth daily.        Allergies:  Allergies  Allergen Reactions   Iodinated Diagnostic Agents Itching and Rash   Ivp Dye [Iodinated Diagnostic Agents] Itching and Rash   Morphine And Related Hives   Penicillins Rash    Past Medical History, Surgical history, Social history, and Family History were reviewed and updated.  Review of Systems: Review of Systems  Constitutional:  Positive for malaise/fatigue.  HENT: Negative.    Eyes: Negative.   Respiratory: Negative.    Cardiovascular: Negative.   Gastrointestinal:  Positive for abdominal pain.  Genitourinary: Negative.   Musculoskeletal:  Positive for myalgias.  Skin: Negative.   Neurological: Negative.   Endo/Heme/Allergies: Negative.    Psychiatric/Behavioral: Negative.      Physical Exam:  weight is 207 lb 1.9 oz (93.9 kg). Her oral temperature is 97.9 F (36.6 C). Her blood pressure is 103/52 (abnormal) and her pulse is 109 (abnormal). Her respiration is 17 and oxygen saturation is 98%.   Wt Readings from Last 3 Encounters:  04/15/21 207 lb 1.9 oz (93.9 kg)  03/26/21 205 lb (93 kg)  03/04/21 202 lb (91.6 kg)   Physical Exam Vitals reviewed.  HENT:     Head: Normocephalic and atraumatic.  Eyes:  Pupils: Pupils are equal, round, and reactive to light.  Cardiovascular:     Rate and Rhythm: Normal rate and regular rhythm.     Heart sounds: Normal heart sounds.  Pulmonary:     Effort: Pulmonary effort is normal.     Breath sounds: Normal breath sounds.  Abdominal:     General: Bowel sounds are normal.     Palpations: Abdomen is soft.  Musculoskeletal:        General: No tenderness or deformity. Normal range of motion.     Cervical back: Normal range of motion.  Lymphadenopathy:     Cervical: No cervical adenopathy.  Skin:    General: Skin is warm and dry.     Findings: No erythema or rash.  Neurological:     Mental Status: She is alert and oriented to person, place, and time.  Psychiatric:        Behavior: Behavior normal.        Thought Content: Thought content normal.        Judgment: Judgment normal.     Lab Results  Component Value Date   WBC 5.3 04/15/2021   HGB 10.6 (L) 04/15/2021   HCT 33.1 (L) 04/15/2021   MCV 91.7 04/15/2021   PLT 362 04/15/2021   Lab Results  Component Value Date   FERRITIN 284 06/15/2020   IRON 55 06/15/2020   TIBC 323 06/15/2020   UIBC 268 06/15/2020   IRONPCTSAT 17 (L) 06/15/2020   Lab Results  Component Value Date   RETICCTPCT 5.7 (H) 06/15/2020   RBC 3.61 (L) 04/15/2021   No results found for: KPAFRELGTCHN, LAMBDASER, KAPLAMBRATIO No results found for: IGGSERUM, IGA, IGMSERUM No results found for: Kathrynn Ducking, MSPIKE, SPEI   Chemistry      Component Value Date/Time   NA 136 04/15/2021 0900   NA 136 01/07/2020 1256   K 4.1 04/15/2021 0900   CL 101 04/15/2021 0900   CO2 28 04/15/2021 0900   BUN 14 04/15/2021 0900   BUN 10 01/07/2020 1256   CREATININE 0.60 04/15/2021 0900      Component Value Date/Time   CALCIUM 9.8 04/15/2021 0900   ALKPHOS 53 04/15/2021 0900   AST 15 04/15/2021 0900   ALT 11 04/15/2021 0900   BILITOT 0.4 04/15/2021 0900       Impression and Plan: Angela Wilson is a very pleasant 55 yo [name] female with metastatic pancreatic cancer.   Again, I just think that we have to stick with this protocol for a couple more cycles and see how her tumor markers trend.  Again, not sure why she has the neck pain.  We will have to see about a ultrasound of sonogram make sure there is nothing going on with blood clots.  Again we will see about trying to get a biopsy if we can.  I would think this might be reasonable for tissue.  We will try to set this up in a week or so.  She will get Gemzar today and then next week.  I will then plan to see her back for the start of her fourth cycle of treatment.   Volanda Napoleon, MD 7/28/20229:45 AM

## 2021-04-15 NOTE — Patient Instructions (Signed)
Implanted Port Home Guide An implanted port is a device that is placed under the skin. It is usually placed in the chest. The device can be used to give IV medicine, to take blood, or for dialysis. You may have an implanted port if: You need IV medicine that would be irritating to the small veins in your hands or arms. You need IV medicines, such as antibiotics, for a long period of time. You need IV nutrition for a long period of time. You need dialysis. When you have a port, your health care provider can choose to use the port instead of veins in your arms for these procedures. You may have fewer limitations when using a port than you would if you used other types of long-term IVs, and you will likely be able to return to normal activities afteryour incision heals. An implanted port has two main parts: Reservoir. The reservoir is the part where a needle is inserted to give medicines or draw blood. The reservoir is round. After it is placed, it appears as a small, raised area under your skin. Catheter. The catheter is a thin, flexible tube that connects the reservoir to a vein. Medicine that is inserted into the reservoir goes into the catheter and then into the vein. How is my port accessed? To access your port: A numbing cream may be placed on the skin over the port site. Your health care provider will put on a mask and sterile gloves. The skin over your port will be cleaned carefully with a germ-killing soap and allowed to dry. Your health care provider will gently pinch the port and insert a needle into it. Your health care provider will check for a blood return to make sure the port is in the vein and is not clogged. If your port needs to remain accessed to get medicine continuously (constant infusion), your health care provider will place a clear bandage (dressing) over the needle site. The dressing and needle will need to be changed every week, or as told by your health care provider. What  is flushing? Flushing helps keep the port from getting clogged. Follow instructions from your health care provider about how and when to flush the port. Ports are usually flushed with saline solution or a medicine called heparin. The need for flushing will depend on how the port is used: If the port is only used from time to time to give medicines or draw blood, the port may need to be flushed: Before and after medicines have been given. Before and after blood has been drawn. As part of routine maintenance. Flushing may be recommended every 4-6 weeks. If a constant infusion is running, the port may not need to be flushed. Throw away any syringes in a disposal container that is meant for sharp items (sharps container). You can buy a sharps container from a pharmacy, or you can make one by using an empty hard plastic bottle with a cover. How long will my port stay implanted? The port can stay in for as long as your health care provider thinks it is needed. When it is time for the port to come out, a surgery will be done to remove it. The surgery will be similar to the procedure that was done to putthe port in. Follow these instructions at home:  Flush your port as told by your health care provider. If you need an infusion over several days, follow instructions from your health care provider about how to take   care of your port site. Make sure you: Wash your hands with soap and water before you change your dressing. If soap and water are not available, use alcohol-based hand sanitizer. Change your dressing as told by your health care provider. Place any used dressings or infusion bags into a plastic bag. Throw that bag in the trash. Keep the dressing that covers the needle clean and dry. Do not get it wet. Do not use scissors or sharp objects near the tube. Keep the tube clamped, unless it is being used. Check your port site every day for signs of infection. Check for: Redness, swelling, or  pain. Fluid or blood. Pus or a bad smell. Protect the skin around the port site. Avoid wearing bra straps that rub or irritate the site. Protect the skin around your port from seat belts. Place a soft pad over your chest if needed. Bathe or shower as told by your health care provider. The site may get wet as long as you are not actively receiving an infusion. Return to your normal activities as told by your health care provider. Ask your health care provider what activities are safe for you. Carry a medical alert card or wear a medical alert bracelet at all times. This will let health care providers know that you have an implanted port in case of an emergency. Get help right away if: You have redness, swelling, or pain at the port site. You have fluid or blood coming from your port site. You have pus or a bad smell coming from the port site. You have a fever. Summary Implanted ports are usually placed in the chest for long-term IV access. Follow instructions from your health care provider about flushing the port and changing bandages (dressings). Take care of the area around your port by avoiding clothing that puts pressure on the area, and by watching for signs of infection. Protect the skin around your port from seat belts. Place a soft pad over your chest if needed. Get help right away if you have a fever or you have redness, swelling, pain, drainage, or a bad smell at the port site. This information is not intended to replace advice given to you by your health care provider. Make sure you discuss any questions you have with your healthcare provider. Document Revised: 01/20/2020 Document Reviewed: 01/20/2020 Elsevier Patient Education  2022 Elsevier Inc.  

## 2021-04-15 NOTE — Patient Instructions (Signed)
Gemcitabine injection What is this medication? GEMCITABINE (jem SYE ta been) is a chemotherapy drug. This medicine is used to treat many types of cancer like breast cancer, lung cancer, pancreatic cancer,and ovarian cancer. This medicine may be used for other purposes; ask your health care provider orpharmacist if you have questions. COMMON BRAND NAME(S): Gemzar, Infugem What should I tell my care team before I take this medication? They need to know if you have any of these conditions: blood disorders infection kidney disease liver disease lung or breathing disease, like asthma recent or ongoing radiation therapy an unusual or allergic reaction to gemcitabine, other chemotherapy, other medicines, foods, dyes, or preservatives pregnant or trying to get pregnant breast-feeding How should I use this medication? This drug is given as an infusion into a vein. It is administered in a hospitalor clinic by a specially trained health care professional. Talk to your pediatrician regarding the use of this medicine in children.Special care may be needed. Overdosage: If you think you have taken too much of this medicine contact apoison control center or emergency room at once. NOTE: This medicine is only for you. Do not share this medicine with others. What if I miss a dose? It is important not to miss your dose. Call your doctor or health careprofessional if you are unable to keep an appointment. What may interact with this medication? medicines to increase blood counts like filgrastim, pegfilgrastim, sargramostim some other chemotherapy drugs like cisplatin vaccines Talk to your doctor or health care professional before taking any of thesemedicines: acetaminophen aspirin ibuprofen ketoprofen naproxen This list may not describe all possible interactions. Give your health care provider a list of all the medicines, herbs, non-prescription drugs, or dietary supplements you use. Also tell them if  you smoke, drink alcohol, or use illegaldrugs. Some items may interact with your medicine. What should I watch for while using this medication? Visit your doctor for checks on your progress. This drug may make you feel generally unwell. This is not uncommon, as chemotherapy can affect healthy cells as well as cancer cells. Report any side effects. Continue your course oftreatment even though you feel ill unless your doctor tells you to stop. In some cases, you may be given additional medicines to help with side effects.Follow all directions for their use. Call your doctor or health care professional for advice if you get a fever, chills or sore throat, or other symptoms of a cold or flu. Do not treat yourself. This drug decreases your body's ability to fight infections. Try toavoid being around people who are sick. This medicine may increase your risk to bruise or bleed. Call your doctor orhealth care professional if you notice any unusual bleeding. Be careful brushing and flossing your teeth or using a toothpick because you may get an infection or bleed more easily. If you have any dental work done,tell your dentist you are receiving this medicine. Avoid taking products that contain aspirin, acetaminophen, ibuprofen, naproxen, or ketoprofen unless instructed by your doctor. These medicines may hide afever. Do not become pregnant while taking this medicine or for 6 months after stopping it. Women should inform their doctor if they wish to become pregnant or think they might be pregnant. Men should not father a child while taking this medicine and for 3 months after stopping it. There is a potential for serious side effects to an unborn child. Talk to your health care professional or pharmacist for more information. Do not breast-feed an infant while takingthis medicine or   for at least 1 week after stopping it. Men should inform their doctors if they wish to father a child. This medicine may lower sperm  counts. Talk with your doctor or health care professional ifyou are concerned about your fertility. What side effects may I notice from receiving this medication? Side effects that you should report to your doctor or health care professionalas soon as possible: allergic reactions like skin rash, itching or hives, swelling of the face, lips, or tongue breathing problems pain, redness, or irritation at site where injected signs and symptoms of a dangerous change in heartbeat or heart rhythm like chest pain; dizziness; fast or irregular heartbeat; palpitations; feeling faint or lightheaded, falls; breathing problems signs of decreased platelets or bleeding - bruising, pinpoint red spots on the skin, black, tarry stools, blood in the urine signs of decreased red blood cells - unusually weak or tired, feeling faint or lightheaded, falls signs of infection - fever or chills, cough, sore throat, pain or difficulty passing urine signs and symptoms of kidney injury like trouble passing urine or change in the amount of urine signs and symptoms of liver injury like dark yellow or brown urine; general ill feeling or flu-like symptoms; light-colored stools; loss of appetite; nausea; right upper belly pain; unusually weak or tired; yellowing of the eyes or skin swelling of ankles, feet, hands Side effects that usually do not require medical attention (report to yourdoctor or health care professional if they continue or are bothersome): constipation diarrhea hair loss loss of appetite nausea rash vomiting This list may not describe all possible side effects. Call your doctor for medical advice about side effects. You may report side effects to FDA at1-800-FDA-1088. Where should I keep my medication? This drug is given in a hospital or clinic and will not be stored at home. NOTE: This sheet is a summary. It may not cover all possible information. If you have questions about this medicine, talk to your  doctor, pharmacist, orhealth care provider.  2022 Elsevier/Gold Standard (2017-11-29 18:06:11)  

## 2021-04-15 NOTE — Progress Notes (Signed)
Patient is doing fair today. She continues to have pain. Dr Marin Olp sent in a prescription for Donnatal however the insurance will not cover and the cost is $1400. Notified Dr Marin Olp and he will consider next possible action.   Dr Marin Olp would also like to have a biopsy completed for molecular studies. Reviewed patient chart, and studies were completed on 03/02/2020. Notified Dr Marin Olp of this and provided him with report. Biopsy request will be cancelled.   Notified patient's daughter via [facility], that biopsy was cancelled as needed testing was already completed.   Oncology Nurse Navigator Documentation  Oncology Nurse Navigator Flowsheets 04/15/2021  Confirmed Diagnosis Date -  Diagnosis Status -  Planned Course of Treatment -  Phase of Treatment -  Chemotherapy Pending- Reason: -  Chemotherapy Actual Start Date: -  Navigator Follow Up Date: 04/22/2021  Navigator Follow Up Reason: Follow-up Appointment;Chemotherapy  Navigator Location [facility]  Referral Date to RadOnc/MedOnc -  Navigator Encounter Type Treatment;Appt/Treatment Plan Review  Telephone -  Treatment Initiated Date -  Patient Visit Type MedOnc  Treatment Phase Active Tx  Barriers/Navigation Needs Coordination of Care;Cultural Needs;Family Concerns;Language/Communication;Education  Education Other  Interventions Coordination of Care;Psycho-Social Support  Acuity Level 2-Minimal Needs (1-2 Barriers Identified)  Referrals -  Coordination of Care -  Education Method Verbal  Support Groups/Services Friends and Family  Time Spent with Patient 30

## 2021-04-16 ENCOUNTER — Ambulatory Visit (HOSPITAL_BASED_OUTPATIENT_CLINIC_OR_DEPARTMENT_OTHER): Payer: 59

## 2021-04-16 ENCOUNTER — Telehealth: Payer: Self-pay | Admitting: *Deleted

## 2021-04-16 LAB — CA 125: Cancer Antigen (CA) 125: 584 U/mL — ABNORMAL HIGH (ref 0.0–38.1)

## 2021-04-16 NOTE — Addendum Note (Signed)
Addended by: Burney Gauze R on: 04/16/2021 10:45 AM   Modules accepted: Orders

## 2021-04-16 NOTE — Telephone Encounter (Signed)
This nurse called [facility], daughter, and instructed her to schedule the CT scan today while she is her today getting the US done. She verbalized understanding.

## 2021-04-20 ENCOUNTER — Emergency Department (HOSPITAL_BASED_OUTPATIENT_CLINIC_OR_DEPARTMENT_OTHER): Payer: 59

## 2021-04-20 ENCOUNTER — Emergency Department (HOSPITAL_BASED_OUTPATIENT_CLINIC_OR_DEPARTMENT_OTHER)
Admission: RE | Admit: 2021-04-20 | Discharge: 2021-04-20 | Disposition: A | Discharge status: Home / Self Care | Payer: 59 | Source: Ambulatory Visit | Attending: Hematology & Oncology | Admitting: Hematology & Oncology

## 2021-04-20 ENCOUNTER — Emergency Department (HOSPITAL_BASED_OUTPATIENT_CLINIC_OR_DEPARTMENT_OTHER)
Admission: EM | Admit: 2021-04-20 | Discharge: 2021-04-21 | Disposition: A | Discharge status: Home / Self Care | Payer: 59 | Attending: Emergency Medicine | Admitting: Emergency Medicine

## 2021-04-20 ENCOUNTER — Encounter (HOSPITAL_BASED_OUTPATIENT_CLINIC_OR_DEPARTMENT_OTHER): Payer: Self-pay | Admitting: *Deleted

## 2021-04-20 ENCOUNTER — Other Ambulatory Visit: Payer: Self-pay

## 2021-04-20 ENCOUNTER — Ambulatory Visit (HOSPITAL_BASED_OUTPATIENT_CLINIC_OR_DEPARTMENT_OTHER): Payer: 59

## 2021-04-20 DIAGNOSIS — Z79899 Other long term (current) drug therapy: Secondary | ICD-10-CM | POA: Insufficient documentation

## 2021-04-20 DIAGNOSIS — K219 Gastro-esophageal reflux disease without esophagitis: Secondary | ICD-10-CM | POA: Diagnosis not present

## 2021-04-20 DIAGNOSIS — I10 Essential (primary) hypertension: Secondary | ICD-10-CM | POA: Diagnosis not present

## 2021-04-20 DIAGNOSIS — J90 Pleural effusion, not elsewhere classified: Secondary | ICD-10-CM | POA: Insufficient documentation

## 2021-04-20 DIAGNOSIS — Z7984 Long term (current) use of oral hypoglycemic drugs: Secondary | ICD-10-CM | POA: Diagnosis not present

## 2021-04-20 DIAGNOSIS — Z8507 Personal history of malignant neoplasm of pancreas: Secondary | ICD-10-CM | POA: Diagnosis not present

## 2021-04-20 DIAGNOSIS — E785 Hyperlipidemia, unspecified: Secondary | ICD-10-CM | POA: Insufficient documentation

## 2021-04-20 DIAGNOSIS — C252 Malignant neoplasm of tail of pancreas: Secondary | ICD-10-CM | POA: Insufficient documentation

## 2021-04-20 DIAGNOSIS — M542 Cervicalgia: Secondary | ICD-10-CM | POA: Diagnosis present

## 2021-04-20 DIAGNOSIS — I82C11 Acute embolism and thrombosis of right internal jugular vein: Secondary | ICD-10-CM | POA: Diagnosis not present

## 2021-04-20 DIAGNOSIS — E1169 Type 2 diabetes mellitus with other specified complication: Secondary | ICD-10-CM | POA: Insufficient documentation

## 2021-04-20 LAB — CBC WITH DIFFERENTIAL/PLATELET
Abs Immature Granulocytes: 0.02 10*3/uL (ref 0.00–0.07)
Basophils Absolute: 0 10*3/uL (ref 0.0–0.1)
Basophils Relative: 0 %
Eosinophils Absolute: 0.1 10*3/uL (ref 0.0–0.5)
Eosinophils Relative: 2 %
HCT: 35 % — ABNORMAL LOW (ref 36.0–46.0)
Hemoglobin: 11.4 g/dL — ABNORMAL LOW (ref 12.0–15.0)
Immature Granulocytes: 1 %
Lymphocytes Relative: 45 %
Lymphs Abs: 1.4 10*3/uL (ref 0.7–4.0)
MCH: 29.3 pg (ref 26.0–34.0)
MCHC: 32.6 g/dL (ref 30.0–36.0)
MCV: 90 fL (ref 80.0–100.0)
Monocytes Absolute: 0.3 10*3/uL (ref 0.1–1.0)
Monocytes Relative: 8 %
Neutro Abs: 1.3 10*3/uL — ABNORMAL LOW (ref 1.7–7.7)
Neutrophils Relative %: 44 %
Platelets: 314 10*3/uL (ref 150–400)
RBC: 3.89 MIL/uL (ref 3.87–5.11)
RDW: 19.6 % — ABNORMAL HIGH (ref 11.5–15.5)
WBC: 3 10*3/uL — ABNORMAL LOW (ref 4.0–10.5)
nRBC: 0.7 % — ABNORMAL HIGH (ref 0.0–0.2)

## 2021-04-20 LAB — COMPREHENSIVE METABOLIC PANEL
ALT: 28 U/L (ref 0–44)
AST: 44 U/L — ABNORMAL HIGH (ref 15–41)
Albumin: 3.5 g/dL (ref 3.5–5.0)
Alkaline Phosphatase: 53 U/L (ref 38–126)
Anion gap: 12 (ref 5–15)
BUN: 11 mg/dL (ref 6–20)
CO2: 21 mmol/L — ABNORMAL LOW (ref 22–32)
Calcium: 8.6 mg/dL — ABNORMAL LOW (ref 8.9–10.3)
Chloride: 101 mmol/L (ref 98–111)
Creatinine, Ser: 0.47 mg/dL (ref 0.44–1.00)
GFR, Estimated: >60 mL/min (ref >60)
Glucose, Bld: 153 mg/dL — ABNORMAL HIGH (ref 70–99)
Potassium: 4.3 mmol/L (ref 3.5–5.1)
Sodium: 134 mmol/L — ABNORMAL LOW (ref 135–145)
Total Bilirubin: 0.7 mg/dL (ref 0.3–1.2)
Total Protein: 7.7 g/dL (ref 6.5–8.1)

## 2021-04-20 LAB — CBG MONITORING, ED: Glucose-Capillary: 180 mg/dL — ABNORMAL HIGH (ref 70–99)

## 2021-04-20 LAB — LIPASE, BLOOD: Lipase: 27 U/L (ref 11–51)

## 2021-04-20 MED ORDER — DIPHENHYDRAMINE HCL 25 MG PO CAPS: 50.0000 mg | ORAL_CAPSULE | Freq: Once | ORAL | Status: AC

## 2021-04-20 MED ORDER — DIPHENHYDRAMINE HCL 50 MG/ML IJ SOLN
50.0000 mg | Freq: Once | INTRAMUSCULAR | Status: AC
  Administered 2021-04-20: 50 mg via INTRAVENOUS
  Filled 2021-04-20: qty 1

## 2021-04-20 MED ORDER — ENOXAPARIN SODIUM 100 MG/ML IJ SOSY
1.0000 mg/kg | PREFILLED_SYRINGE | Freq: Once | INTRAMUSCULAR | Status: AC
  Administered 2021-04-20: 95 mg via SUBCUTANEOUS
  Filled 2021-04-20: qty 1

## 2021-04-20 MED ORDER — HYDROCORTISONE NA SUCCINATE PF 100 MG IJ SOLR
100.0000 mg | Freq: Once | INTRAMUSCULAR | Status: AC
  Administered 2021-04-20: 100 mg via INTRAVENOUS
  Filled 2021-04-20: qty 2

## 2021-04-20 MED ORDER — IOHEXOL 350 MG/ML SOLN
100.0000 mL | Freq: Once | INTRAVENOUS | Status: AC | PRN
  Administered 2021-04-20: 100 mL via INTRAVENOUS

## 2021-04-20 MED ORDER — HYDROCORTISONE NA SUCCINATE PF 250 MG IJ SOLR: 200.0000 mg | Freq: Once | INTRAMUSCULAR | Status: DC

## 2021-04-20 NOTE — ED Notes (Signed)
Pt refusing to change into gown, IV, or meds before seeing doctor

## 2021-04-20 NOTE — Progress Notes (Signed)
Contrast extravasation of 100 mls Omni 350 occurred during injection for CTA scan in right arm. Dr Tyrone Nine was informed and came to assess pt. IV was removed and heat pack applied to elevated extremity. Pt's daughter was waiting in hallway and informed me that pt always has "swelling of veins" during contrast injections. Pt was taken back to room and RN alerted and asked to access power port for contrast injection.

## 2021-04-20 NOTE — ED Provider Notes (Signed)
[place] [facility] EMERGENCY DEPARTMENT Provider Note   CSN: [id number] Arrival date & time: 04/20/21  1737     History Chief Complaint  Patient presents with   Neck Pain    Angela Wilson is a 55 y.o. female.  55 yo F with a chief complaints of right-sided neck pain.  Patient had been seen by her oncologist and is scheduled a thyroid ultrasound today.  During that ultrasound it was found that she likely has jugular thrombus.  Was then notified by the ultrasonographer as he was trying to get in touch with the oncologist but was unable to get anyone by phone.  I suggested that they check into the ED to be evaluated.  Patient complaining of pain along the right side of her neck and extends into the shoulder.  Has been going on since they have reaccessed her port to resume chemotherapy for pancreatic cancer.  Denies any issue with using the port.  The history is provided by the patient.  Neck Pain Pain location:  R side Quality:  Aching Pain radiates to:  L shoulder Pain severity:  Moderate Onset quality:  Gradual Duration:  2 weeks Timing:  Constant Progression:  Worsening Chronicity:  New Relieved by:  Nothing Worsened by:  Nothing Ineffective treatments:  None tried Associated symptoms: no chest pain, no fever and no headaches       Past Medical History:  Diagnosis Date   Diabetes mellitus without complication (HCC)    Family history of brain cancer    Family history of breast cancer    Family history of kidney cancer    Family history of ovarian cancer    GERD (gastroesophageal reflux disease)    Hyperlipidemia    Hypertension    Kidney stone    Pancreatic cancer ([place]) 02/03/2020   Shingles    Type 2 diabetes mellitus ([place])     Patient Active Problem List   Diagnosis Date Noted   Genetic testing 05/12/2020   Family history of breast cancer    Family history of ovarian cancer    Family history of kidney cancer    Family history of brain cancer     Essential hypertension, benign 04/06/2020   Pancreatic cancer ([place]) 02/14/2020   Cancer associated pain 02/13/2020   Pancreatic cancer ([place]) 02/03/2020   Screening for colon cancer 01/07/2020   Encounter for screening mammogram for malignant neoplasm of breast 01/07/2020   Need for Tdap vaccination 01/07/2020   Pancreatic mass 12/30/2019   Nephrolithiasis 12/20/2012   GERD (gastroesophageal reflux disease) 12/20/2012   Type 2 diabetes mellitus ([place]) 11/09/2012    Past Surgical History:  Procedure Laterality Date   CESAREAN SECTION     x 4   IR IMAGING GUIDED PORT INSERTION  02/18/2020   KIDNEY STONE SURGERY       OB History   No obstetric history on file.     Family History  Problem Relation Age of Onset   Heart attack Maternal Uncle    Breast cancer Paternal Aunt        dx. in her 45s   Brain cancer Paternal Uncle 3   Kidney cancer Cousin        dx. <50   Ovarian cancer Cousin    Breast cancer Cousin        dx. "young"   Ovarian cancer Cousin    Cancer Cousin        Cancer on the back, dx. in her 51s   Lung  cancer Cousin     Social History   Tobacco Use   Smoking status: Never   Smokeless tobacco: Never  Vaping Use   Vaping Use: Never used  Substance Use Topics   Alcohol use: No   Drug use: Never    Home Medications Prior to Admission medications   Medication Sig Start Date End Date Taking? Authorizing Provider  enoxaparin (LOVENOX) 100 MG/ML injection Inject 0.95 mLs (95 mg total) into the skin every 12 (twelve) hours. 04/21/21  Yes Delo, Nathaneil Canary, MD  albuterol (PROVENTIL HFA;VENTOLIN HFA) 108 (90 Base) MCG/ACT inhaler Inhale 2 puffs into the lungs every 4 (four) hours as needed for wheezing or shortness of breath. 04/01/16   Janne Napoleon, NP  belladonna-PHENObarbital (DONNATAL) 16.2 MG/5ML ELIX Take 5 mLs (16.2 mg total) by mouth 4 (four) times daily. 04/22/21   Volanda Napoleon, MD  capecitabine (XELODA) 500 MG tablet Take 3 tablets (1,500 mg total) by mouth  2 (two) times daily after a meal. Take for 14 days on, 7 days off. Repeat every 21 days. 03/30/21   Volanda Napoleon, MD  cetirizine (ZYRTEC) 10 MG tablet Take 1 tablet (10 mg total) by mouth daily. 04/06/20   Daleen Squibb, MD  CREON [phone] units CPEP capsule  03/01/20   [provider]  dexamethasone (DECADRON) 4 MG tablet Take 1 tablet (4 mg total) by mouth 2 (two) times daily with a meal. For three (3) days following chemotherapy. 03/26/21   Volanda Napoleon, MD  dicyclomine (BENTYL) 10 MG/5ML solution Take 5 mLs (10 mg total) by mouth 4 (four) times daily -  before meals and at bedtime. 04/22/21   Volanda Napoleon, MD  dicyclomine (BENTYL) 10 MG/5ML solution Take 10 mLs (20 mg total) by mouth 4 (four) times daily -  before meals and at bedtime. 04/22/21   Volanda Napoleon, MD  dronabinol (MARINOL) 2.5 MG capsule Take 1 capsule (2.5 mg total) by mouth 2 (two) times daily before lunch and supper. 02/19/21   Volanda Napoleon, MD  ergocalciferol (VITAMIN D2) 1.25 MG (50000 UT) capsule Take 1 capsule (50,000 Units total) by mouth once a week. 10/07/20   Just, Laurita Quint, FNP  fentaNYL (DURAGESIC) 75 MCG/HR Place 1 patch onto the skin every other day. 04/01/21   [place], Holli Humbles, NP  gabapentin (NEURONTIN) 300 MG capsule TAKE 2 CAPSULES BY MOUTH IN THE DAYTIME AND 2 CAPSULES AT BEDTIME 12/08/20 12/08/21  Volanda Napoleon, MD  glipiZIDE (GLUCOTROL) 10 MG tablet Take 1 tablet (10 mg total) by mouth 2 (two) times daily. 02/03/21   Volanda Napoleon, MD  HYDROcodone bit-homatropine (HYCODAN) 5-1.5 MG/5ML syrup Take 5 mLs by mouth every 6 (six) hours as needed for cough. 03/04/21   Volanda Napoleon, MD  lidocaine-prilocaine (EMLA) cream APPLY TO PORT 1 HOUR BEFORE ACCESS. 02/17/21 02/17/22  Volanda Napoleon, MD  LORazepam (ATIVAN) 0.5 MG tablet Take 0.5 mg every six hours as needed for nausea. 01/27/21   Volanda Napoleon, MD  magic mouthwash w/lidocaine SOLN Take 5 mLs by mouth 3 (three) times daily as  needed for mouth pain. Patient not taking: No sig reported 07/29/20   [place], Holli Humbles, NP  metFORMIN (GLUCOPHAGE XR) 500 MG 24 hr tablet Take 1 tablet (500 mg total) by mouth daily with breakfast. 10/07/20   Just, Laurita Quint, FNP  omeprazole (PRILOSEC) 40 MG capsule Take 40 mg by mouth daily. 04/22/21   [provider]  ondansetron (  ZOFRAN) 4 MG tablet Take 1 tablet (4 mg total) by mouth every 6 (six) hours as needed for nausea. 02/26/20   Volanda Napoleon, MD  oxyCODONE (OXY IR/ROXICODONE) 5 MG immediate release tablet Take 1 tablet (5 mg total) by mouth every 4 (four) hours as needed for severe pain. 02/17/21   Volanda Napoleon, MD  PB-Hyoscy-Atropine-Scopolamine (PHENOBARBITAL-BELLADONNA ALK) 16.2 MG/5ML ELIX TAKE 5 MLS (16.2 MG TOTAL) BY MOUTH 4 (FOUR) TIMES DAILY. Patient not taking: No sig reported 10/27/20 10/27/21  Volanda Napoleon, MD  polyethylene glycol (MIRALAX / GLYCOLAX) 17 g packet Take 17 g by mouth daily as needed. 02/18/20   Alma Friendly, MD  prochlorperazine (COMPAZINE) 10 MG tablet Take 1 tablet (10 mg total) by mouth every 6 (six) hours as needed for nausea or vomiting. 03/10/20   Volanda Napoleon, MD  Pyridoxine HCl (VITAMIN B-6) 250 MG tablet Take 1 tablet (250 mg total) by mouth daily. 06/29/20   Volanda Napoleon, MD  rivaroxaban (XARELTO) 10 MG TABS tablet Take 1 tablet (10 mg total) by mouth daily. 07/21/20 10/27/20  Volanda Napoleon, MD    Allergies    Iodinated diagnostic agents, Ivp dye [iodinated diagnostic agents], Morphine and related, and Penicillins  Review of Systems   Review of Systems  Constitutional:  Negative for chills and fever.  HENT:  Negative for congestion and rhinorrhea.   Eyes:  Negative for redness and visual disturbance.  Respiratory:  Negative for shortness of breath and wheezing.   Cardiovascular:  Negative for chest pain and palpitations.  Gastrointestinal:  Negative for nausea and vomiting.  Genitourinary:  Negative for dysuria and  urgency.  Musculoskeletal:  Positive for neck pain. Negative for arthralgias and myalgias.  Skin:  Negative for pallor and wound.  Neurological:  Negative for dizziness and headaches.   Physical Exam Updated Vital Signs BP 131/75   Pulse 85   Temp 98.2 F (36.8 C) (Oral)   Resp 16   Ht $R'5\' 4"'vn$  (1.626 m)   Wt 93.9 kg   SpO2 95%   BMI 35.53 kg/m   Physical Exam Vitals and nursing note reviewed.  Constitutional:      General: She is not in acute distress.    Appearance: She is well-developed. She is not diaphoretic.  HENT:     Head: Normocephalic and atraumatic.  Eyes:     Pupils: Pupils are equal, round, and reactive to light.  Cardiovascular:     Rate and Rhythm: Normal rate and regular rhythm.     Heart sounds: No murmur heard.   No friction rub. No gallop.  Pulmonary:     Effort: Pulmonary effort is normal.     Breath sounds: No wheezing or rales.  Abdominal:     General: There is no distension.     Palpations: Abdomen is soft.     Tenderness: There is no abdominal tenderness.  Musculoskeletal:        General: No tenderness.     Cervical back: Normal range of motion and neck supple.     Comments: No warmth or erythema over the port site.  Mild fullness of the external jugular on that side without significant tenderness.  No erythema.  No appreciable swelling to the upper extremity.  Pulse motor and sensation intact to the right upper extremity.  Skin:    General: Skin is warm and dry.  Neurological:     Mental Status: She is alert and oriented to person, place, and  time.  Psychiatric:        Behavior: Behavior normal.    ED Results / Procedures / Treatments   Labs (all labs ordered are listed, but only abnormal results are displayed) Labs Reviewed  CBC WITH DIFFERENTIAL/PLATELET - Abnormal; Notable for the following components:      Result Value   WBC 3.0 (*)    Hemoglobin 11.4 (*)    HCT 35.0 (*)    RDW 19.6 (*)    nRBC 0.7 (*)    Neutro Abs 1.3 (*)     All other components within normal limits  COMPREHENSIVE METABOLIC PANEL - Abnormal; Notable for the following components:   Sodium 134 (*)    CO2 21 (*)    Glucose, Bld 153 (*)    Calcium 8.6 (*)    AST 44 (*)    All other components within normal limits  CBG MONITORING, ED - Abnormal; Notable for the following components:   Glucose-Capillary 180 (*)    All other components within normal limits  LIPASE, BLOOD    EKG None  Radiology CT Angio Chest PE W and/or Wo Contrast  Result Date: 04/21/2021 CLINICAL DATA:  PE suspected, high prob; Abdominal pain, acute, nonlocalized PE suspected and acute abdominal pain. Hx diabetes, GERD, HTN, kidney stone, and pancreatic cancer. EXAM: CT ANGIOGRAPHY CHEST CT ABDOMEN AND PELVIS WITH CONTRAST TECHNIQUE: Multidetector CT imaging of the chest was performed using the standard protocol during bolus administration of intravenous contrast. Multiplanar CT image reconstructions and MIPs were obtained to evaluate the vascular anatomy. Multidetector CT imaging of the abdomen and pelvis was performed using the standard protocol during bolus administration of intravenous contrast. Due to multiple technical difficulties with access, bolus injection was stopped twice resulting in suboptimal opacification of the pulmonary arterial tree. CONTRAST:  166mL OMNIPAQUE IOHEXOL 350 MG/ML SOLN COMPARISON:  01/27/2021 FINDINGS: CTA CHEST FINDINGS Cardiovascular: There is inadequate opacification of the pulmonary arterial tree to evaluate for the presence of a intraluminal filling defect the pulmonary arterial tree is of normal caliber. Global cardiac size within normal limits. No pericardial effusion. The thoracic aorta is unremarkable. Bovine arch anatomy with wide patency of the arch vasculature proximally. Right internal jugular chest port tip is seen within the right atrium. Mediastinum/Nodes: No pathologic thoracic adenopathy. Esophagus unremarkable. Lungs/Pleura: Moderate  right pleural effusion is present, new since prior examination with compressive atelectasis of the right lung. Lungs are otherwise clear. No pneumothorax. No pleural effusion on the left. Central airways are widely patent. Musculoskeletal: No lytic or blastic bone lesions. No acute bone abnormality. Review of the MIP images confirms the above findings. 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 No intra or extrahepatic biliary ductal dilation. Gallbladder absent. Pancreas: There is again noted mild fullness of the tail of the pancreas, however, a discrete mass is not clearly identified. No pancreatic ductal dilation. No peripancreatic inflammatory changes identified. Spleen: Unremarkable Adrenals/Urinary Tract: The adrenal glands are unremarkable. The kidneys are normal in size and position. 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 There has developed mild distension of the renal pelves without associated caliectasis which may simply relate to aggressive hydration. The kidneys are otherwise unremarkable. The bladder is unremarkable. Stomach/Bowel: Serosal implants involving the transverse and sigmoid mesocolon are again  identified. No evidence of obstruction. The stomach, small bowel, and large bowel are otherwise unremarkable. The appendix is not clearly identified, however, there are no secondary signs of appendicitis within the right lower quadrant. Trace ascites appears slightly increased since prior examination. Multiple peritoneal implants are again identified, most prominently within the periumbilical region and appear grossly stable since prior examination. No free intraperitoneal gas. Vascular/Lymphatic: No pathologic  adenopathy within the abdomen and pelvis. The abdominal vasculature is unremarkable. Reproductive: The uterus is unremarkable. 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 Cystic collection within the right adnexa, by example, has significantly increased in overall dimension since prior examination, measuring 7.5 x 10.6 x 10.5 cm in greatest dimension (previously measuring 6.4 x 11.5 by 7.8 cm in greatest dimension). Other: No abdominal wall hernia Musculoskeletal: No acute bone abnormality. 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 Review of the MIP images confirms the above findings. IMPRESSION: Technically limited examination. The examination is essentially nondiagnostic for the assessment for pulmonary embolism. No pulmonary arterial enlargement or CT evidence of right heart strain. Interval development of a moderate right pleural effusion likely related to diaphragmatic involvement by hepatic dome serosal implants. Interval disease progression. 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 Slight interval increase in ascites. Electronically Signed   By: Fidela Salisbury MD   On: 04/21/2021 00:36   US Soft Tissue Head/Neck  Result Date: 04/21/2021 CLINICAL DATA:  55 year old female with right-sided neck pain. History of ovarian and pancreatic malignancy EXAM: ULTRASOUND OF HEAD/NECK SOFT TISSUES TECHNIQUE: Ultrasound examination of the head and neck soft tissues was performed in the area of clinical concern. COMPARISON:  CTA PE, 04/21/2021.  IR port placement, 02/18/2020 FINDINGS: Directed sonographic evaluation of the right neck in area of palpable concern demonstrated occlusive right internal jugular vein  thrombus, without flow by Doppler evaluation. Noting this area of thrombus is cephalad to right IJ port catheter IMPRESSION: Occlusive right IJ vein thrombus, in the setting of ipsilateral port catheter. Suspect catheter-related thrombosis. Patient was escorted by the sonographer to the emergency room for further evaluation. Michaelle Birks, MD [facility] Electronically Signed   By: Michaelle Birks MD   On: 04/21/2021 08:09   CT ABDOMEN PELVIS W CONTRAST  Result Date: 04/21/2021 CLINICAL DATA:  PE suspected, high prob; Abdominal pain, acute, nonlocalized PE suspected and acute abdominal pain. Hx diabetes, GERD, HTN, kidney stone, and pancreatic cancer. EXAM: CT ANGIOGRAPHY CHEST CT ABDOMEN AND PELVIS WITH CONTRAST TECHNIQUE: Multidetector CT imaging of the chest was performed using the standard protocol during bolus administration of intravenous contrast. Multiplanar CT image reconstructions and MIPs were obtained to evaluate the vascular anatomy. Multidetector CT imaging of the abdomen and pelvis was performed using the standard protocol during bolus administration of intravenous contrast. Due to multiple technical difficulties with access, bolus injection was stopped twice resulting in suboptimal opacification of the pulmonary arterial tree. CONTRAST:  136m OMNIPAQUE IOHEXOL 350 MG/ML SOLN COMPARISON:  01/27/2021 FINDINGS: CTA CHEST FINDINGS Cardiovascular: There is inadequate opacification of the pulmonary arterial tree to evaluate for the presence of a intraluminal filling defect the pulmonary arterial tree is of normal caliber. Global cardiac size within normal limits. No pericardial effusion. The thoracic aorta is unremarkable. Bovine arch  anatomy with wide patency of the arch vasculature proximally. Right internal jugular chest port tip is seen within the right atrium. Mediastinum/Nodes: No pathologic thoracic adenopathy. Esophagus unremarkable.  Lungs/Pleura: Moderate right pleural effusion is present, new since prior examination with compressive atelectasis of the right lung. Lungs are otherwise clear. No pneumothorax. No pleural effusion on the left. Central airways are widely patent. Musculoskeletal: No lytic or blastic bone lesions. No acute bone abnormality. Review of the MIP images confirms the above findings. 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 No intra or extrahepatic biliary ductal dilation. Gallbladder absent. Pancreas: There is again noted mild fullness of the tail of the pancreas, however, a discrete mass is not clearly identified. No pancreatic ductal dilation. No peripancreatic inflammatory changes identified. Spleen: Unremarkable Adrenals/Urinary Tract: The adrenal glands are unremarkable. The kidneys are normal in size and position. 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 There has developed mild distension of the renal pelves without associated caliectasis which may simply relate to aggressive hydration. The kidneys are otherwise unremarkable. The bladder is unremarkable. Stomach/Bowel: Serosal implants involving the transverse and sigmoid mesocolon are again identified. No evidence of obstruction. The stomach, small bowel, and large bowel are otherwise unremarkable. The appendix is not clearly identified, however, there are no secondary signs of appendicitis within the right lower quadrant. Trace ascites appears slightly increased since prior examination. Multiple peritoneal implants are again identified, most prominently within the periumbilical region and appear grossly stable since prior examination. No free intraperitoneal gas.  Vascular/Lymphatic: No pathologic adenopathy within the abdomen and pelvis. The abdominal vasculature is unremarkable. Reproductive: The uterus is unremarkable. 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 Cystic collection within the right adnexa, by example, has significantly increased in overall dimension since prior examination, measuring 7.5 x 10.6 x 10.5 cm in greatest dimension (previously measuring 6.4 x 11.5 by 7.8 cm in greatest dimension). Other: No abdominal wall hernia Musculoskeletal: No acute bone abnormality. 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 Review of the MIP images confirms the above findings. IMPRESSION: Technically limited examination. The examination is essentially nondiagnostic for the assessment for pulmonary embolism. No pulmonary arterial enlargement or CT evidence of right heart strain. Interval development of a moderate right pleural effusion likely related to diaphragmatic involvement by hepatic dome serosal implants. Interval disease progression. 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 Slight interval increase in ascites. Electronically Signed   By: Fidela Salisbury MD   On: 04/21/2021 00:36   US Venous Img Upper Uni Right(DVT)  Result Date: 04/20/2021 CLINICAL DATA:  Right neck pain, metastatic pancreatic cancer EXAM: RIGHT UPPER EXTREMITY VENOUS DOPPLER ULTRASOUND TECHNIQUE: Gray-scale sonography with graded compression, as well as color Doppler and duplex ultrasound were performed to evaluate the upper extremity deep venous system from the level of the subclavian vein and including the jugular, axillary, basilic, radial, ulnar and upper cephalic vein. Spectral  Doppler was utilized to evaluate flow at rest and with distal augmentation maneuvers. COMPARISON:  01/27/2021 FINDINGS: Contralateral Subclavian Vein: Respiratory phasicity is  normal and symmetric with the symptomatic side. No evidence of thrombus. Normal compressibility. Internal Jugular Vein: There is occlusive thrombus within the right internal jugular vein, extending into the right innominate vein. Subclavian Vein: No evidence of thrombus. Normal compressibility, respiratory phasicity and response to augmentation. Axillary Vein: No evidence of thrombus. Normal compressibility, respiratory phasicity and response to augmentation. Cephalic Vein: No evidence of thrombus. Normal compressibility, respiratory phasicity and response to augmentation. Basilic Vein: No evidence of thrombus. Normal compressibility, respiratory phasicity and response to augmentation. Brachial Veins: No evidence of thrombus. Normal compressibility, respiratory phasicity and response to augmentation. Radial Veins: No evidence of thrombus. Normal compressibility, respiratory phasicity and response to augmentation. Ulnar Veins: No evidence of thrombus. Normal compressibility, respiratory phasicity and response to augmentation. IMPRESSION: 1. 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 There is an indwelling right chest wall port via internal jugular approach. These results will be called to the ordering clinician or representative by the Radiologist Assistant, and communication documented in the PACS or [employer]. Electronically Signed   By: Randa Ngo M.D.   On: 04/20/2021 19:11    Procedures Procedures   Medications Ordered in ED Medications  diphenhydrAMINE (BENADRYL) capsule 50 mg ( Oral See Alternative 04/20/21 2145)    Or  diphenhydrAMINE (BENADRYL) injection 50 mg (50 mg Intravenous Given 04/20/21 2145)  hydrocortisone sodium succinate (SOLU-CORTEF) 100 MG injection 100  mg (100 mg Intravenous Given 04/20/21 1844)    And  hydrocortisone sodium succinate (SOLU-CORTEF) 100 MG injection 100 mg (100 mg Intravenous Given 04/20/21 1843)  enoxaparin (LOVENOX) injection 95 mg (95 mg Subcutaneous Given 04/20/21 2145)  iohexol (OMNIPAQUE) 350 MG/ML injection 100 mL (100 mLs Intravenous Contrast Given 04/20/21 2249)  heparin lock flush 100 unit/mL (500 Units Intracatheter Given 04/21/21 0106)    ED Course  I have reviewed the triage vital signs and the nursing notes.  Pertinent labs & imaging results that were available during my care of the patient were reviewed by me and considered in my medical decision making (see chart for details).    MDM Rules/Calculators/A&P                           55 yo F with concern for a right upper extremity DVT.  The patient had been sent by her oncologist for a thyroid ultrasound.  Incidentally found to have thrombus.  I was notified by the ultrasonographer and had the patient check-in.  She is also scheduled for a CT scan of the chest abdomen pelvis.  Will evaluate for pulmonary embolism.  Blood work.  Reassess.  Patient's ultrasound is consistent with an IVC thrombus.  Will discuss with oncology.  Discussed with Dr. Malena Peer, agreed with current workup.  Recommends lovenox.  If CT without systemic clot burden felt ok for d/c and outpatient follow up with her primary oncologist.   The patients results and plan were reviewed and discussed.   Any x-rays performed were independently reviewed by myself.   Differential diagnosis were considered with the presenting HPI.  Medications  diphenhydrAMINE (BENADRYL) capsule 50 mg ( Oral See Alternative 04/20/21 2145)    Or  diphenhydrAMINE (BENADRYL) injection 50 mg (50 mg Intravenous Given 04/20/21 2145)  hydrocortisone sodium succinate (SOLU-CORTEF) 100 MG injection 100 mg (100 mg Intravenous Given 04/20/21 1844)    And  hydrocortisone sodium succinate (SOLU-CORTEF) 100 MG injection 100 mg (100 mg  Intravenous Given 04/20/21 1843)  enoxaparin (LOVENOX) injection 95 mg (95 mg Subcutaneous Given 04/20/21 2145)  iohexol (OMNIPAQUE) 350 MG/ML injection 100 mL (100 mLs Intravenous Contrast Given 04/20/21 2249)  heparin lock flush 100 unit/mL (500 Units Intracatheter Given 04/21/21 0106)    Vitals:   04/21/21 0000 04/21/21 0015 04/21/21 0030 04/21/21 0045  BP: (!) 144/77 130/62 128/62 131/75  Pulse: 87 89 89 85  Resp:    16  Temp:      TempSrc:      SpO2: 97% 98% 95% 95%  Weight:      Height:        Final diagnoses:  Thrombosis of internal jugular vein, right ([place])    Final Clinical Impression(s) / ED Diagnoses Final diagnoses:  Thrombosis of internal jugular vein, right ([place])    Rx / DC Orders ED Discharge Orders          Ordered    enoxaparin (LOVENOX) 100 MG/ML injection  Every 12 hours        04/21/21 [place], [place], DO 04/22/21 1603

## 2021-04-20 NOTE — ED Notes (Signed)
US at bedside

## 2021-04-20 NOTE — ED Notes (Signed)
Orders rec for CT scan, premedication orders also rec due to Contrast IVP Dye listed allergy, pt requesting to speak with attending MD prior to any blood draws, IV or medications given.

## 2021-04-20 NOTE — ED Triage Notes (Signed)
Presents with neck pain, primary care MD ordered US. Pt c/o right sided neck pain

## 2021-04-21 ENCOUNTER — Telehealth: Payer: Self-pay | Admitting: *Deleted

## 2021-04-21 ENCOUNTER — Encounter: Payer: Self-pay | Admitting: *Deleted

## 2021-04-21 MED ORDER — HEPARIN SOD (PORK) LOCK FLUSH 100 UNIT/ML IV SOLN
500.0000 [IU] | Freq: Once | INTRAVENOUS | Status: AC
  Administered 2021-04-21: 500 [IU]
  Filled 2021-04-21: qty 5

## 2021-04-21 MED ORDER — ENOXAPARIN SODIUM 100 MG/ML IJ SOSY: 1.0000 mg/kg | PREFILLED_SYRINGE | Freq: Two times a day (BID) | INTRAMUSCULAR | 0 refills | Status: DC

## 2021-04-21 NOTE — Discharge Instructions (Addendum)
Begin administering Lovenox injections as instructed and prescribed.  Follow-up with Dr. Marin Olp on Thursday as scheduled, and return to the ER if you develop any new and/or concerning symptoms.

## 2021-04-21 NOTE — ED Provider Notes (Signed)
  Physical Exam  BP 135/69   Pulse 86   Temp 98.2 F (36.8 C) (Oral)   Resp 17   Ht '5\' 4"'$  (1.626 m)   Wt 93.9 kg   SpO2 99%   BMI 35.53 kg/m   Physical Exam  ED Course/Procedures     Procedures  MDM  Care assumed from Dr. Tyrone Nine at shift change.  Patient with history of ovarian and pancreatic cancer presenting with neck pain.  She was found to have thrombosis within the right internal jugular vein and was sent here for further evaluation of this.  At the recommendation of Dr. Chryl Heck, patient was given Lovenox and CT scans of the chest, abdomen, and pelvis were obtained to rule out extensive clot.  Nothing indicated this on the CT scan.  At this point, patient seems appropriate for discharge with Lovenox injections and follow-up with Dr. Marin Olp on Thursday as scheduled.      Veryl Speak, MD 04/21/21 [phone]

## 2021-04-21 NOTE — Telephone Encounter (Signed)
Returned call to pt daughter regarding [employer]. MD reviewed CT scan, discussed results with pt daughter Rebhab. Rehab advised she cannot get lovenox injections until tomorrow as the pharmacy did not have it in stock. Pt will pick up tomorrow and begin injections. Notified MD. No further concerns.

## 2021-04-21 NOTE — ED Notes (Signed)
Discharge instructions discussed with pt. Pt verbalized understanding. Pt stable and ambulatory.  °

## 2021-04-22 ENCOUNTER — Inpatient Hospital Stay: Payer: 59

## 2021-04-22 ENCOUNTER — Inpatient Hospital Stay: Payer: 59 | Attending: Hematology & Oncology

## 2021-04-22 ENCOUNTER — Ambulatory Visit: Payer: 59

## 2021-04-22 ENCOUNTER — Inpatient Hospital Stay (HOSPITAL_BASED_OUTPATIENT_CLINIC_OR_DEPARTMENT_OTHER): Payer: 59 | Admitting: Hematology & Oncology

## 2021-04-22 ENCOUNTER — Other Ambulatory Visit: Payer: Self-pay

## 2021-04-22 ENCOUNTER — Other Ambulatory Visit (HOSPITAL_BASED_OUTPATIENT_CLINIC_OR_DEPARTMENT_OTHER): Payer: Self-pay

## 2021-04-22 ENCOUNTER — Encounter: Payer: Self-pay | Admitting: Hematology & Oncology

## 2021-04-22 VITALS — BP 133/81 | HR 111 | Temp 98.6°F

## 2021-04-22 DIAGNOSIS — Z7901 Long term (current) use of anticoagulants: Secondary | ICD-10-CM | POA: Diagnosis not present

## 2021-04-22 DIAGNOSIS — I82C11 Acute embolism and thrombosis of right internal jugular vein: Secondary | ICD-10-CM | POA: Insufficient documentation

## 2021-04-22 DIAGNOSIS — J910 Malignant pleural effusion: Secondary | ICD-10-CM | POA: Insufficient documentation

## 2021-04-22 DIAGNOSIS — G893 Neoplasm related pain (acute) (chronic): Secondary | ICD-10-CM | POA: Diagnosis not present

## 2021-04-22 DIAGNOSIS — Z79891 Long term (current) use of opiate analgesic: Secondary | ICD-10-CM | POA: Insufficient documentation

## 2021-04-22 DIAGNOSIS — R188 Other ascites: Secondary | ICD-10-CM | POA: Insufficient documentation

## 2021-04-22 DIAGNOSIS — C252 Malignant neoplasm of tail of pancreas: Secondary | ICD-10-CM | POA: Diagnosis present

## 2021-04-22 DIAGNOSIS — R112 Nausea with vomiting, unspecified: Secondary | ICD-10-CM | POA: Insufficient documentation

## 2021-04-22 DIAGNOSIS — C772 Secondary and unspecified malignant neoplasm of intra-abdominal lymph nodes: Secondary | ICD-10-CM | POA: Insufficient documentation

## 2021-04-22 DIAGNOSIS — R3589 Other polyuria: Secondary | ICD-10-CM | POA: Insufficient documentation

## 2021-04-22 DIAGNOSIS — Z79899 Other long term (current) drug therapy: Secondary | ICD-10-CM | POA: Diagnosis not present

## 2021-04-22 DIAGNOSIS — C786 Secondary malignant neoplasm of retroperitoneum and peritoneum: Secondary | ICD-10-CM | POA: Insufficient documentation

## 2021-04-22 DIAGNOSIS — Z7984 Long term (current) use of oral hypoglycemic drugs: Secondary | ICD-10-CM | POA: Diagnosis not present

## 2021-04-22 DIAGNOSIS — C7982 Secondary malignant neoplasm of genital organs: Secondary | ICD-10-CM | POA: Diagnosis not present

## 2021-04-22 DIAGNOSIS — Z95828 Presence of other vascular implants and grafts: Secondary | ICD-10-CM

## 2021-04-22 DIAGNOSIS — C251 Malignant neoplasm of body of pancreas: Secondary | ICD-10-CM | POA: Diagnosis not present

## 2021-04-22 DIAGNOSIS — Z5111 Encounter for antineoplastic chemotherapy: Secondary | ICD-10-CM | POA: Diagnosis present

## 2021-04-22 LAB — CBC WITH DIFFERENTIAL (CANCER CENTER ONLY)
Abs Immature Granulocytes: 0.02 10*3/uL (ref 0.00–0.07)
Basophils Absolute: 0 10*3/uL (ref 0.0–0.1)
Basophils Relative: 0 %
Eosinophils Absolute: 0 10*3/uL (ref 0.0–0.5)
Eosinophils Relative: 1 %
HCT: 31.7 % — ABNORMAL LOW (ref 36.0–46.0)
Hemoglobin: 10.3 g/dL — ABNORMAL LOW (ref 12.0–15.0)
Immature Granulocytes: 1 %
Lymphocytes Relative: 39 %
Lymphs Abs: 1.3 10*3/uL (ref 0.7–4.0)
MCH: 29.7 pg (ref 26.0–34.0)
MCHC: 32.5 g/dL (ref 30.0–36.0)
MCV: 91.4 fL (ref 80.0–100.0)
Monocytes Absolute: 0.3 10*3/uL (ref 0.1–1.0)
Monocytes Relative: 10 %
Neutro Abs: 1.7 10*3/uL (ref 1.7–7.7)
Neutrophils Relative %: 49 %
Platelet Count: 198 10*3/uL (ref 150–400)
RBC: 3.47 MIL/uL — ABNORMAL LOW (ref 3.87–5.11)
RDW: 19.8 % — ABNORMAL HIGH (ref 11.5–15.5)
WBC Count: 3.4 10*3/uL — ABNORMAL LOW (ref 4.0–10.5)
nRBC: 0 % (ref 0.0–0.2)

## 2021-04-22 LAB — CMP (CANCER CENTER ONLY)
ALT: 18 U/L (ref 0–44)
AST: 20 U/L (ref 15–41)
Albumin: 3.5 g/dL (ref 3.5–5.0)
Alkaline Phosphatase: 57 U/L (ref 38–126)
Anion gap: 7 (ref 5–15)
BUN: 15 mg/dL (ref 6–20)
CO2: 27 mmol/L (ref 22–32)
Calcium: 9 mg/dL (ref 8.9–10.3)
Chloride: 101 mmol/L (ref 98–111)
Creatinine: 0.54 mg/dL (ref 0.44–1.00)
GFR, Estimated: >60 mL/min (ref >60)
Glucose, Bld: 194 mg/dL — ABNORMAL HIGH (ref 70–99)
Potassium: 3.9 mmol/L (ref 3.5–5.1)
Sodium: 135 mmol/L (ref 135–145)
Total Bilirubin: 0.6 mg/dL (ref 0.3–1.2)
Total Protein: 6.8 g/dL (ref 6.5–8.1)

## 2021-04-22 MED ORDER — DICYCLOMINE HCL 10 MG/5ML PO SOLN
10.0000 mg | Freq: Three times a day (TID) | ORAL | 12 refills | Status: DC
  Filled 2021-04-22: qty 240, 12d supply, fill #0

## 2021-04-22 MED ORDER — STERILE WATER FOR INJECTION IJ SOLN
INTRAMUSCULAR | Status: AC
  Filled 2021-04-22: qty 10

## 2021-04-22 MED ORDER — ALTEPLASE 2 MG IJ SOLR
INTRAMUSCULAR | Status: AC
  Filled 2021-04-22: qty 2

## 2021-04-22 MED ORDER — PB-HYOSCY-ATROPINE-SCOPOLAMINE 16.2 MG/5ML PO ELIX
5.0000 mL | ORAL_SOLUTION | Freq: Four times a day (QID) | ORAL | 2 refills | Status: DC
  Filled 2021-04-22: qty 600, 30d supply, fill #0
  Filled 2021-04-22: qty 480, 24d supply, fill #0

## 2021-04-22 MED ORDER — DICYCLOMINE HCL 10 MG/5ML PO SOLN
20.0000 mg | Freq: Three times a day (TID) | ORAL | 12 refills | Status: DC
  Filled 2021-04-22: qty 473, 12d supply, fill #0
  Filled 2021-04-22: qty 500, 13d supply, fill #0

## 2021-04-22 MED ORDER — HYDROMORPHONE HCL 1 MG/ML IJ SOLN
2.0000 mg | Freq: Once | INTRAMUSCULAR | Status: DC
Start: 2021-04-22 — End: 2021-04-22

## 2021-04-22 MED ORDER — HYDROMORPHONE HCL 1 MG/ML IJ SOLN
2.0000 mg | Freq: Once | INTRAMUSCULAR | Status: AC
Start: 2021-04-22 — End: 2021-04-22
  Administered 2021-04-22: 2 mg via INTRAVENOUS

## 2021-04-22 MED ORDER — HYDROMORPHONE HCL 1 MG/ML IJ SOLN
INTRAMUSCULAR | Status: AC
  Filled 2021-04-22: qty 2

## 2021-04-22 MED ORDER — ALTEPLASE 2 MG IJ SOLR
2.0000 mg | Freq: Once | INTRAMUSCULAR | Status: AC | PRN
  Administered 2021-04-22: 2 mg
  Filled 2021-04-22: qty 2

## 2021-04-22 MED FILL — PB-Hyoscy-Atrop-Scopol Elix 16.2-0.1037-0.0194-0.0065 MG/5ML: ORAL | 30 days supply | Qty: 600 | Fill #0 | Status: CN

## 2021-04-22 NOTE — Progress Notes (Signed)
Port was accessed, blood return noticed but it stopped. Unable to obtain blood for labs. After multiple attempts cathflo was placed at 1252. Blood return at 1313, blood samples collected for lab.

## 2021-04-22 NOTE — Progress Notes (Signed)
Hematology and Oncology Follow Up Visit  Angela Wilson [id number] 15-Oct-1965 55 y.o. 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  Unfortunately, we clearly have progressive disease now.  She had a CT of the abdomen pelvis done on 04/20/2021.  This showed progression in her pelvis.  She has progressive disease with respect to bilateral ovarian implants.  She has omental implants.  There is a slight increase in ascites.  She has interval development of a moderate right pleural effusion.  She also now has a thrombus in the right internal jugular vein.  She is complaining of pain in the right neck when we saw her last week.  She had a Doppler done which showed the thrombus.  She now is starting Lovenox for this.  The problem is that we really are starting to run out of options for her therapy.  She still has a decent performance status.  She is going to 56 years old.  I did talk to them about clinical trial participation.  I will call down to [facility] to see if they may have any protocol for her..  I do want to get a other biopsy and send it off for molecular markers.  I think this will be important.  The last time we did this was a year ago.  She is having the chronic abdominal pain.  This is pelvic pain.  I am sure this is from the implants that she has in her ovaries.  I did talk to her son-in-law and daughter about  the possibility of radiation therapy if we cannot get any shrinkage from our systemic therapy.  I know that radiation therapy does not have that great of a "track record" with pancreatic cancer but this might be done for palliative purposes.  I think the next option for her would be weekly infusional 5-FU.  I know that she recently was on Xeloda with Gemzar.  We will stop this.  The 5-FU is done with leucovorin.  The infusion is over 24 hours.  Is a fairly high dose infusion.  I think she could tolerate this.  She is still eating okay.  I think her weight is about the same.  There is no problems with going to the bathroom.  She has had no diarrhea.  She has had no bleeding.  She has had no urinary issues.  She has had no fever.  There has been no leg swelling.  She is denied any obvious shortness of breath.  I know she is trying hard.  Her family is doing a great job with her.  She has had pancreatic cancer now for about 16 months.  She is done exceedingly well.  Overall, I would say her performance status is probably ECOG 1.   Medications:  Allergies as of 04/22/2021       Reactions   Iodinated  Diagnostic Agents Itching, Rash   Ivp Dye [iodinated Diagnostic Agents] Itching, Rash   Morphine And Related Hives   Penicillins Rash        Medication List        Accurate as of April 22, 2021  4:15 PM. If you have any questions, ask your nurse or doctor.          albuterol 108 (90 Base) MCG/ACT inhaler Commonly known as: VENTOLIN HFA Inhale 2 puffs into the lungs every 4 (four) hours as needed for wheezing or shortness of breath.   capecitabine 500 MG tablet Commonly known as: Xeloda Take 3 tablets (1,500 mg total) by mouth 2 (two) times daily after a meal. Take for 14 days on, 7 days off. Repeat every 21 days.   cetirizine 10 MG tablet Commonly known as: ZYRTEC Take 1 tablet (10 mg total) by mouth daily.   Creon 36000 UNITS Cpep capsule Generic drug:  lipase/protease/amylase   dexamethasone 4 MG tablet Commonly known as: DECADRON Take 1 tablet (4 mg total) by mouth 2 (two) times daily with a meal. For three (3) days following chemotherapy.   dicyclomine 10 MG/5ML solution Commonly known as: BENTYL Take 5 mLs (10 mg total) by mouth 4 (four) times daily -  before meals and at bedtime. Started by: Tildon Husky, RN   dicyclomine 10 MG/5ML solution Commonly known as: BENTYL Take 10 mLs (20 mg total) by mouth 4 (four) times daily -  before meals and at bedtime. Started by: Volanda Napoleon, MD   dronabinol 2.5 MG capsule Commonly known as: MARINOL Take 1 capsule (2.5 mg total) by mouth 2 (two) times daily before lunch and supper.   enoxaparin 100 MG/ML injection Commonly known as: LOVENOX Inject 0.95 mLs (95 mg total) into the skin every 12 (twelve) hours.   ergocalciferol 1.25 MG (50000 UT) capsule Commonly known as: VITAMIN D2 Take 1 capsule (50,000 Units total) by mouth once a week.   fentaNYL 75 MCG/HR Commonly known as: [place] 1 patch onto the skin every other day.   gabapentin 300 MG capsule Commonly known as: NEURONTIN TAKE 2 CAPSULES BY MOUTH IN THE DAYTIME AND 2 CAPSULES AT BEDTIME   glipiZIDE 10 MG tablet Commonly known as: GLUCOTROL Take 1 tablet (10 mg total) by mouth 2 (two) times daily.   Hycodan 5-1.5 MG/5ML syrup Generic drug: HYDROcodone bit-homatropine Take 5 mLs by mouth every 6 (six) hours as needed for cough.   lidocaine-prilocaine cream Commonly known as: EMLA APPLY TO PORT 1 HOUR BEFORE ACCESS.   LORazepam 0.5 MG tablet Commonly known as: ATIVAN Take 0.5 mg every six hours as needed for nausea.   magic mouthwash w/lidocaine Soln Take 5 mLs by mouth 3 (three) times daily as needed for mouth pain.   metFORMIN 500 MG 24 hr tablet Commonly known as: Glucophage XR Take 1 tablet (500 mg total) by mouth daily with breakfast.   omeprazole 40 MG capsule Commonly known as: PRILOSEC Take  40 mg by mouth daily.   ondansetron 4 MG tablet Commonly known as: ZOFRAN Take 1 tablet (4 mg total) by mouth every 6 (six) hours as needed for nausea.   oxyCODONE 5 MG immediate release tablet Commonly known as: Oxy IR/ROXICODONE Take 1 tablet (5 mg total) by mouth every 4 (four) hours as needed for severe pain.   PHENobarbital-Belladonna Alk 16.2 MG/5ML Elix TAKE 5 MLS (16.2 MG TOTAL) BY MOUTH 4 (FOUR) TIMES DAILY.   belladonna-PHENObarbital 16.2 MG/5ML Elix Commonly  known as: Donnatal Take 5 mLs (16.2 mg total) by mouth 4 (four) times daily.   polyethylene glycol 17 g packet Commonly known as: MIRALAX / GLYCOLAX Take 17 g by mouth daily as needed.   prochlorperazine 10 MG tablet Commonly known as: COMPAZINE Take 1 tablet (10 mg total) by mouth every 6 (six) hours as needed for nausea or vomiting.   vitamin B-6 250 MG tablet Take 1 tablet (250 mg total) by mouth daily.        Allergies:  Allergies  Allergen Reactions   Iodinated Diagnostic Agents Itching and Rash   Ivp Dye [Iodinated Diagnostic Agents] Itching and Rash   Morphine And Related Hives   Penicillins Rash    Past Medical History, Surgical history, Social history, and Family History were reviewed and updated.  Review of Systems: Review of Systems  Constitutional:  Positive for malaise/fatigue.  HENT: Negative.    Eyes: Negative.   Respiratory: Negative.    Cardiovascular: Negative.   Gastrointestinal:  Positive for abdominal pain.  Genitourinary: Negative.   Musculoskeletal:  Positive for myalgias.  Skin: Negative.   Neurological: Negative.   Endo/Heme/Allergies: Negative.   Psychiatric/Behavioral: Negative.      Physical Exam:  vitals were not taken for this visit.   Wt Readings from Last 3 Encounters:  04/20/21 207 lb 0.2 oz (93.9 kg)  04/15/21 207 lb 1.9 oz (93.9 kg)  03/26/21 205 lb (93 kg)   Physical Exam Vitals reviewed.  HENT:     Head: Normocephalic and atraumatic.  Eyes:      Pupils: Pupils are equal, round, and reactive to light.  Cardiovascular:     Rate and Rhythm: Normal rate and regular rhythm.     Heart sounds: Normal heart sounds.  Pulmonary:     Effort: Pulmonary effort is normal.     Breath sounds: Normal breath sounds.  Abdominal:     General: Bowel sounds are normal.     Palpations: Abdomen is soft.  Musculoskeletal:        General: No tenderness or deformity. Normal range of motion.     Cervical back: Normal range of motion.  Lymphadenopathy:     Cervical: No cervical adenopathy.  Skin:    General: Skin is warm and dry.     Findings: No erythema or rash.  Neurological:     Mental Status: She is alert and oriented to person, place, and time.  Psychiatric:        Behavior: Behavior normal.        Thought Content: Thought content normal.        Judgment: Judgment normal.     Lab Results  Component Value Date   WBC 3.4 (L) 04/22/2021   HGB 10.3 (L) 04/22/2021   HCT 31.7 (L) 04/22/2021   MCV 91.4 04/22/2021   PLT 198 04/22/2021   Lab Results  Component Value Date   FERRITIN 284 06/15/2020   IRON 55 06/15/2020   TIBC 323 06/15/2020   UIBC 268 06/15/2020   IRONPCTSAT 17 (L) 06/15/2020   Lab Results  Component Value Date   RETICCTPCT 5.7 (H) 06/15/2020   RBC 3.47 (L) 04/22/2021   No results found for: KPAFRELGTCHN, LAMBDASER, KAPLAMBRATIO No results found for: IGGSERUM, IGA, IGMSERUM No results found for: Ronnald Ramp, A1GS, A2GS, Violet Baldy, MSPIKE, SPEI   Chemistry      Component Value Date/Time   NA 135 04/22/2021 1314   NA 136 01/07/2020 1256   K 3.9 04/22/2021 1314  CL 101 04/22/2021 1314   CO2 27 04/22/2021 1314   BUN 15 04/22/2021 1314   BUN 10 01/07/2020 1256   CREATININE 0.54 04/22/2021 1314      Component Value Date/Time   CALCIUM 9.0 04/22/2021 1314   ALKPHOS 57 04/22/2021 1314   AST 20 04/22/2021 1314   ALT 18 04/22/2021 1314   BILITOT 0.6 04/22/2021 1314       Impression and  Plan: Angela Wilson is a very pleasant 55 yo [name] female with metastatic pancreatic cancer.   Unfortunately, we are seeing progressive disease.  Maybe, the weekly 5-FU infusion might help.  I think this would be reasonable.  I will call down to [facility] in [place] to see if they do have any protocols for metastatic pancreatic cancer.  I think that they would probably want to get another biopsy and send off for molecular markers to see if she has any changes in her genetic make-up of the tumor.  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  We will try to move along quickly.  She will start the Lovenox today.  I told her son that if she has the biopsy on Monday, and to stop the Lovenox on Sunday night and then restart the afternoon after the biopsy is done.  I just wanted to her quality of life to be as good as possible.  I know that pain is been a chronic issue.  I also know that she really wants to go to [place] to see family.  I am not sure we will be able to get her there.  However, if she really feels that this is where she wants to go, we will do our best to accommodate her.  I will try to see her back in about 3 weeks.  I think that her tumor markers will show [place] how she is doing.   Volanda Napoleon, MD 8/4/20224:15 PM

## 2021-04-23 ENCOUNTER — Encounter (HOSPITAL_COMMUNITY): Payer: Self-pay | Admitting: Radiology

## 2021-04-23 ENCOUNTER — Other Ambulatory Visit (HOSPITAL_BASED_OUTPATIENT_CLINIC_OR_DEPARTMENT_OTHER): Payer: Self-pay

## 2021-04-23 LAB — CA 125: Cancer Antigen (CA) 125: 610 U/mL — ABNORMAL HIGH (ref 0.0–38.1)

## 2021-04-23 LAB — CEA (IN HOUSE-CHCC): CEA (CHCC-In House): 42.78 ng/mL — ABNORMAL HIGH (ref 0.00–5.00)

## 2021-04-23 NOTE — Progress Notes (Signed)
Patient Name  Neila, Holts Legal Sex  Female DOB  [birth year] SSN  [id number] Address  [place] [address] Phone  [phone] [facility])  [phone] (Mobile)     RE: CT Biopsy Received: Today Ennever, Rudell Cobb, MD  Hadley Pen:  I told the patient and her son in law to stop the Lovenox the day before the procedure!!  Please have a very blessed weekend!!  Laurey Arrow         Previous Messages    ----- Message -----  From: Garth Bigness D  Sent: 04/22/2021   5:17 PM EDT  To: Volanda Napoleon, MD  Subject: FW: CT Biopsy                                   Hi Dr. Marin Olp patient is on Lovenox and will need to hold for 1 day prior to biopsy. Please advise if okay to hold. Patient was placed on Lovenox while in ER. Thanks Aniceto Boss  ----- Message -----  From: Garth Bigness D  Sent: 04/22/2021   5:15 PM EDT  To: Ir Procedure Requests  Subject: CT Biopsy                                       Procedure:  CT Biopsy   Reason:  Malignant neoplasm of body of pancreas, I must have tissue for molecular markers. She has metastatic pancreatic cancer that is progressing   History:  CT, [place] in computer   Provider:  Volanda Napoleon   Provider Contact:  [phone]

## 2021-04-23 NOTE — Progress Notes (Signed)
Patient Demographics  Patient Name  Angela Wilson, Angela Wilson Legal Sex  Female DOB  [birth year] SSN  [id number] Address  [place] [address] Phone  [phone] [facility])  [phone] (Mobile)     RE: CT Biopsy Received: Today Suttle, Rosanne Ashing, MD  Garth Bigness D Approved for CT guided periumbilical peritoneal mass biopsy.   Dylan         Previous Messages    ----- Message -----  From: Garth Bigness D  Sent: 04/22/2021   5:15 PM EDT  To: Ir Procedure Requests  Subject: CT Biopsy                                       Procedure:  CT Biopsy   Reason:  Malignant neoplasm of body of pancreas, I must have tissue for molecular markers. She has metastatic pancreatic cancer that is progressing   History:  CT, [place] in computer   Provider:  Volanda Napoleon   Provider Contact:  [phone]

## 2021-04-26 ENCOUNTER — Other Ambulatory Visit: Payer: Self-pay | Admitting: *Deleted

## 2021-04-26 ENCOUNTER — Encounter: Payer: Self-pay | Admitting: *Deleted

## 2021-04-26 DIAGNOSIS — C251 Malignant neoplasm of body of pancreas: Secondary | ICD-10-CM

## 2021-04-27 ENCOUNTER — Inpatient Hospital Stay: Payer: 59

## 2021-04-27 ENCOUNTER — Ambulatory Visit (HOSPITAL_BASED_OUTPATIENT_CLINIC_OR_DEPARTMENT_OTHER): Payer: 59

## 2021-04-27 ENCOUNTER — Other Ambulatory Visit: Payer: Self-pay

## 2021-04-27 ENCOUNTER — Encounter: Payer: Self-pay | Admitting: *Deleted

## 2021-04-27 VITALS — BP 101/50 | HR 82 | Temp 98.4°F | Resp 18

## 2021-04-27 DIAGNOSIS — C252 Malignant neoplasm of tail of pancreas: Secondary | ICD-10-CM

## 2021-04-27 DIAGNOSIS — Z5111 Encounter for antineoplastic chemotherapy: Secondary | ICD-10-CM | POA: Diagnosis not present

## 2021-04-27 DIAGNOSIS — C251 Malignant neoplasm of body of pancreas: Secondary | ICD-10-CM

## 2021-04-27 LAB — CBC WITH DIFFERENTIAL (CANCER CENTER ONLY)
Abs Immature Granulocytes: 0.15 10*3/uL — ABNORMAL HIGH (ref 0.00–0.07)
Basophils Absolute: 0 10*3/uL (ref 0.0–0.1)
Basophils Relative: 0 %
Eosinophils Absolute: 0.1 10*3/uL (ref 0.0–0.5)
Eosinophils Relative: 2 %
HCT: 33.9 % — ABNORMAL LOW (ref 36.0–46.0)
Hemoglobin: 10.8 g/dL — ABNORMAL LOW (ref 12.0–15.0)
Immature Granulocytes: 3 %
Lymphocytes Relative: 37 %
Lymphs Abs: 1.9 10*3/uL (ref 0.7–4.0)
MCH: 29.6 pg (ref 26.0–34.0)
MCHC: 31.9 g/dL (ref 30.0–36.0)
MCV: 92.9 fL (ref 80.0–100.0)
Monocytes Absolute: 0.7 10*3/uL (ref 0.1–1.0)
Monocytes Relative: 13 %
Neutro Abs: 2.3 10*3/uL (ref 1.7–7.7)
Neutrophils Relative %: 45 %
Platelet Count: 182 10*3/uL (ref 150–400)
RBC: 3.65 MIL/uL — ABNORMAL LOW (ref 3.87–5.11)
RDW: 20.8 % — ABNORMAL HIGH (ref 11.5–15.5)
WBC Count: 5.1 10*3/uL (ref 4.0–10.5)
nRBC: 0 % (ref 0.0–0.2)

## 2021-04-27 LAB — CMP (CANCER CENTER ONLY)
ALT: 14 U/L (ref 0–44)
AST: 18 U/L (ref 15–41)
Albumin: 3.5 g/dL (ref 3.5–5.0)
Alkaline Phosphatase: 50 U/L (ref 38–126)
Anion gap: 5 (ref 5–15)
BUN: 10 mg/dL (ref 6–20)
CO2: 29 mmol/L (ref 22–32)
Calcium: 9.2 mg/dL (ref 8.9–10.3)
Chloride: 102 mmol/L (ref 98–111)
Creatinine: 0.6 mg/dL (ref 0.44–1.00)
GFR, Estimated: >60 mL/min (ref >60)
Glucose, Bld: 112 mg/dL — ABNORMAL HIGH (ref 70–99)
Potassium: 4.3 mmol/L (ref 3.5–5.1)
Sodium: 136 mmol/L (ref 135–145)
Total Bilirubin: 0.6 mg/dL (ref 0.3–1.2)
Total Protein: 6.7 g/dL (ref 6.5–8.1)

## 2021-04-27 MED ORDER — SODIUM CHLORIDE 0.9 % IV SOLN
1200.0000 mg/m2 | INTRAVENOUS | Status: DC
  Filled 2021-04-27: qty 49

## 2021-04-27 MED ORDER — LEUCOVORIN CALCIUM INJECTION 350 MG: 200.0000 mg/m2 | Freq: Once | INTRAVENOUS | Status: DC

## 2021-04-27 MED ORDER — SODIUM CHLORIDE 0.9 % IV SOLN
Freq: Once | INTRAVENOUS | Status: AC
  Filled 2021-04-27: qty 250

## 2021-04-27 MED ORDER — PROCHLORPERAZINE MALEATE 10 MG PO TABS
10.0000 mg | ORAL_TABLET | Freq: Once | ORAL | Status: AC
  Administered 2021-04-27: 10 mg via ORAL

## 2021-04-27 MED ORDER — SODIUM CHLORIDE 0.9 % IV SOLN
1200.0000 mg/m2 | INTRAVENOUS | Status: DC
  Administered 2021-04-27: 2450 mg via INTRAVENOUS
  Filled 2021-04-27: qty 49

## 2021-04-27 MED ORDER — HYDROMORPHONE HCL 1 MG/ML IJ SOLN
2.0000 mg | Freq: Once | INTRAMUSCULAR | Status: AC
  Administered 2021-04-27: 2 mg via INTRAVENOUS

## 2021-04-27 MED ORDER — FLUOROURACIL CHEMO INJECTION 2.5 GM/50ML: 1200.0000 mg/m2 | Freq: Once | INTRAVENOUS | Status: DC

## 2021-04-27 MED ORDER — LANREOTIDE ACETATE 120 MG/0.5ML ~~LOC~~ SOLN
SUBCUTANEOUS | Status: AC
  Filled 2021-04-27: qty 120

## 2021-04-27 MED ORDER — LEUCOVORIN CALCIUM INJECTION 350 MG
200.0000 mg/m2 | Freq: Once | INTRAVENOUS | Status: AC
  Administered 2021-04-27: 412 mg via INTRAVENOUS
  Filled 2021-04-27: qty 20.6

## 2021-04-27 MED ORDER — PROCHLORPERAZINE MALEATE 10 MG PO TABS
ORAL_TABLET | ORAL | Status: AC
  Filled 2021-04-27: qty 1

## 2021-04-27 MED ORDER — PEGFILGRASTIM 6 MG/0.6ML ~~LOC~~ PSKT
PREFILLED_SYRINGE | SUBCUTANEOUS | Status: AC
  Filled 2021-04-27: qty 0.6

## 2021-04-27 MED ORDER — TESTOSTERONE CYPIONATE 200 MG/ML IM SOLN
INTRAMUSCULAR | Status: AC
  Filled 2021-04-27: qty 2

## 2021-04-27 MED ORDER — HYDROMORPHONE HCL 1 MG/ML IJ SOLN
INTRAMUSCULAR | Status: AC
  Filled 2021-04-27: qty 2

## 2021-04-27 NOTE — Patient Instructions (Signed)

## 2021-04-27 NOTE — Patient Instructions (Signed)
[facility]  Discharge Instructions: Thank you for choosing [facility] to provide your oncology and hematology care.   If you have a lab appointment with the [facility], please go directly to the [facility] and check in at the registration area.  Wear comfortable clothing and clothing appropriate for easy access to any Portacath or PICC line.   We strive to give you quality time with your provider. You may need to reschedule your appointment if you arrive late (15 or more minutes).  Arriving late affects you and other patients whose appointments are after yours.  Also, if you miss three or more appointments without notifying the office, you may be dismissed from the clinic at the provider's discretion.      For prescription refill requests, have your pharmacy contact our office and allow 72 hours for refills to be completed.    Today you received the following chemotherapy and/or immunotherapy agents 5FU, Leucovorin      To help prevent nausea and vomiting after your treatment, we encourage you to take your nausea medication as directed.  BELOW ARE SYMPTOMS THAT SHOULD BE REPORTED IMMEDIATELY: *FEVER GREATER THAN 100.4 F (38 C) OR HIGHER *CHILLS OR SWEATING *NAUSEA AND VOMITING THAT IS NOT CONTROLLED WITH YOUR NAUSEA MEDICATION *UNUSUAL SHORTNESS OF BREATH *UNUSUAL BRUISING OR BLEEDING *URINARY PROBLEMS (pain or burning when urinating, or frequent urination) *BOWEL PROBLEMS (unusual diarrhea, constipation, pain near the anus) TENDERNESS IN MOUTH AND THROAT WITH OR WITHOUT PRESENCE OF ULCERS (sore throat, sores in mouth, or a toothache) UNUSUAL RASH, SWELLING OR PAIN  UNUSUAL VAGINAL DISCHARGE OR ITCHING   Items with * indicate a potential emergency and should be followed up as soon as possible or go to the Emergency Department if any problems should occur.  Please show the CHEMOTHERAPY ALERT CARD or IMMUNOTHERAPY ALERT CARD at check-in to  the Emergency Department and triage nurse. Should you have questions after your visit or need to cancel or reschedule your appointment, please contact [facility]  [phone] and follow the prompts.  Office hours are 8:00 a.m. to 4:30 p.m. Monday - Friday. Please note that voicemails left after 4:00 p.m. may not be returned until the following business day.  We are closed weekends and major holidays. You have access to a nurse at all times for urgent questions. Please call the main number to the clinic [phone] and follow the prompts.  For any non-urgent questions, you may also contact your provider using MyChart. We now offer e-Visits for anyone 75 and older to request care online for non-urgent symptoms. For details visit [email or web address].   Also download the MyChart app! Go to the app store, search "MyChart", open the app, select [facility], and log in with your MyChart username and password.  Due to Covid, a mask is required upon entering the hospital/clinic. If you do not have a mask, one will be given to you upon arrival. For doctor visits, patients may have 1 support person aged 55 or older with them. For treatment visits, patients cannot have anyone with them due to current Covid guidelines and our immunocompromised population.

## 2021-04-27 NOTE — Progress Notes (Signed)
Patient has progressed. She will begin a new treatment regimen this week. She is also scheduled for a biopsy on 04/30/21 and we will attempt molecular studies again since the last test was done greater than one year ago.   Oncology Nurse Navigator Documentation  Oncology Nurse Navigator Flowsheets 04/27/2021  Confirmed Diagnosis Date -  Diagnosis Status -  Planned Course of Treatment -  Phase of Treatment -  Chemotherapy Pending- Reason: -  Chemotherapy Actual Start Date: -  Navigator Follow Up Date: 04/30/2021  Navigator Follow Up Reason: Surgery  Navigator Location [facility]  Referral Date to RadOnc/MedOnc -  Navigator Encounter Type Appt/Treatment Plan Review  Telephone -  Treatment Initiated Date -  Patient Visit Type MedOnc  Treatment Phase Active Tx  Barriers/Navigation Needs Coordination of Care;Cultural Needs;Family Concerns;Language/Communication;Education  Education -  Interventions None Required  Acuity Level 2-Minimal Needs (1-2 Barriers Identified)  Referrals -  Coordination of Care -  Education Method -  Support Groups/Services Friends and Family  Time Spent with Patient 15

## 2021-04-27 NOTE — Progress Notes (Signed)
5FU pump over 24 hours and LV over 48mns prior to 5FU.  Verified with Dr EMarin Olp

## 2021-04-27 NOTE — Progress Notes (Signed)
Patient complains of pain in abdomen 10/10.  Dr Marin Olp notified.  Dilaudid ordered IV and given.   1430.  Pain much better per patient.  5/10.  Dr Marin Olp stated that patient is to increas OxyIR to 2 tablets instead of 1 every 4 hours.  Son in Sports coach notified

## 2021-04-28 ENCOUNTER — Inpatient Hospital Stay: Payer: 59

## 2021-04-28 VITALS — BP 126/50 | HR 79 | Temp 98.2°F | Resp 19

## 2021-04-28 DIAGNOSIS — Z5111 Encounter for antineoplastic chemotherapy: Secondary | ICD-10-CM | POA: Diagnosis not present

## 2021-04-28 DIAGNOSIS — Z95828 Presence of other vascular implants and grafts: Secondary | ICD-10-CM

## 2021-04-28 MED ORDER — HEPARIN SOD (PORK) LOCK FLUSH 100 UNIT/ML IV SOLN
500.0000 [IU] | Freq: Once | INTRAVENOUS | Status: AC
  Administered 2021-04-28: 500 [IU] via INTRAVENOUS
  Filled 2021-04-28: qty 5

## 2021-04-28 MED ORDER — SODIUM CHLORIDE 0.9% FLUSH
10.0000 mL | Freq: Once | INTRAVENOUS | Status: AC
  Administered 2021-04-28: 10 mL via INTRAVENOUS
  Filled 2021-04-28: qty 10

## 2021-04-28 NOTE — Patient Instructions (Signed)
Tunneled Central Venous Catheter Flushing Guide It is important to flush your tunneled central venous catheter each time you use it, both before and after you use it. Flushing your catheter will help prevent it from clogging. What are the risks? Risks may include: Infection. Air getting into the catheter and bloodstream. Supplies needed: A clean pair of gloves. A disinfecting wipe. Use an alcohol wipe, chlorhexidine wipe, or iodine wipe as told by your health care provider. A 10 mL syringe that has been prefilled with saline solution. An empty 10 mL syringe, if a substance called heparin was injected into your catheter. How to flush your catheter When you flush your catheter, make sure you follow any specific instructions from your health care provider or the manufacturer. These are general guidelines. Flushing your catheter before use If there is heparin in your catheter: Wash your hands with soap and water. Put on gloves. Scrub the injection cap for a minimum of 15 seconds with a disinfecting wipe. Unclamp the catheter. Attach the empty syringe to the injection cap. Pull the syringe plunger back and withdraw 10 mL of blood. Place the syringe into an appropriate waste container. Scrub the injection cap for 15 seconds with a disinfecting wipe. Attach the prefilled syringe to the injection cap. Flush the catheter by pushing the plunger forward until all the liquid from the syringe is in the catheter. Remove the syringe from the injection cap. Clamp the catheter. If there is no heparin in your catheter: Wash your hands with soap and water. Put on gloves. Scrub the injection cap for 15 seconds with a disinfecting wipe. Unclamp the catheter. Attach the prefilled syringe to the injection cap. Flush the catheter by pushing the plunger forward until 5 mL of the liquid from the syringe is in the catheter. Pull back on the syringe until you see blood in the catheter. If you have been asked  to collect any blood, follow your health care provider's instructions. Otherwise, flush the catheter with the rest of the solution from the syringe. Remove the syringe from the injection cap. Clamp the catheter.  Flushing your catheter after use Wash your hands with soap and water. Put on gloves. Scrub the injection cap for 15 seconds with a disinfecting wipe. Unclamp the catheter. Attach the prefilled syringe to the injection cap. Flush the catheter by pushing the plunger forward until all of the liquid from the syringe is in the catheter. Remove the syringe from the injection cap. Clamp the catheter. Problems and solutions If blood cannot be completely cleared from the injection cap, you may need to have the injection cap replaced. If the catheter is difficult to flush, use the pulsing method. The pulsing method involves pushing only a few milliliters of solution into the catheter at a time and pausing between pushes. If you do not see blood in the catheter when you pull back on the syringe, change your body position, such as by raising your arms above your head. Take a deep breath and cough. Then, pull back on the syringe. If you still do not see blood, flush the catheter with a small amount of solution. Then, change positions again and take a breath or cough. Pull back on the syringe again. If you still do not see blood, finish flushing the catheter and contact your health care provider. Do not use your catheter until your health care provider says it is okay. General tips Have someone help you flush your catheter, if possible. Do not force fluid   through your catheter. Do not use a syringe that is larger or smaller than 10 mL. Using a smaller syringe can make the catheter burst. Do not use your catheter without flushing it first if it has heparin in it. Contact a health care provider if: You cannot see any blood in the catheter when you flush it before using it. Your catheter is difficult  to flush. Get help right away if: You cannot flush the catheter. The catheter leaks when you flush it or when there is fluid in it. There are cracks or breaks in the catheter. Summary It is important to flush your tunneled central venous catheter each time you use it, both before and after you use it. Scrub the injection cap for 15 seconds with a disinfecting wipe before and after you flush it. When you flush your catheter, make sure you follow any specific instructions from your health care provider or the manufacturer. Get help right away if you cannot flush the catheter. This information is not intended to replace advice given to you by your health care provider. Make sure you discuss any questions you have with your health care provider. Document Revised: 11/14/2019 Document Reviewed: 11/21/2018 Elsevier Patient Education  2022 [employer]  

## 2021-04-29 ENCOUNTER — Encounter: Payer: Self-pay | Admitting: *Deleted

## 2021-04-30 ENCOUNTER — Encounter: Payer: Self-pay | Admitting: *Deleted

## 2021-04-30 ENCOUNTER — Other Ambulatory Visit: Payer: Self-pay

## 2021-04-30 ENCOUNTER — Ambulatory Visit (HOSPITAL_COMMUNITY)
Admission: RE | Admit: 2021-04-30 | Discharge: 2021-04-30 | Disposition: A | Discharge status: Home / Self Care | Payer: 59 | Source: Ambulatory Visit | Attending: Hematology & Oncology | Admitting: Hematology & Oncology

## 2021-04-30 DIAGNOSIS — C251 Malignant neoplasm of body of pancreas: Secondary | ICD-10-CM | POA: Diagnosis present

## 2021-04-30 DIAGNOSIS — C786 Secondary malignant neoplasm of retroperitoneum and peritoneum: Secondary | ICD-10-CM | POA: Diagnosis not present

## 2021-04-30 LAB — PROTIME-INR
INR: 1.1 (ref 0.8–1.2)
Prothrombin Time: 14.3 seconds (ref 11.4–15.2)

## 2021-04-30 LAB — CBC
HCT: 37.3 % (ref 36.0–46.0)
Hemoglobin: 11.8 g/dL — ABNORMAL LOW (ref 12.0–15.0)
MCH: 29.9 pg (ref 26.0–34.0)
MCHC: 31.6 g/dL (ref 30.0–36.0)
MCV: 94.7 fL (ref 80.0–100.0)
Platelets: 220 10*3/uL (ref 150–400)
RBC: 3.94 MIL/uL (ref 3.87–5.11)
RDW: 20.1 % — ABNORMAL HIGH (ref 11.5–15.5)
WBC: 5.1 10*3/uL (ref 4.0–10.5)
nRBC: 0 % (ref 0.0–0.2)

## 2021-04-30 LAB — GLUCOSE, CAPILLARY
Glucose-Capillary: 92 mg/dL (ref 70–99)
Glucose-Capillary: 100 mg/dL — ABNORMAL HIGH (ref 70–99)

## 2021-04-30 MED ORDER — MIDAZOLAM HCL 2 MG/2ML IJ SOLN
INTRAMUSCULAR | Status: AC
  Filled 2021-04-30: qty 4

## 2021-04-30 MED ORDER — SODIUM CHLORIDE 0.9% FLUSH: 10.0000 mL | INTRAVENOUS | Status: DC | PRN

## 2021-04-30 MED ORDER — MIDAZOLAM HCL 2 MG/2ML IJ SOLN
INTRAMUSCULAR | Status: AC | PRN
  Administered 2021-04-30: 1 mg via INTRAVENOUS
  Administered 2021-04-30: 0.5 mg via INTRAVENOUS

## 2021-04-30 MED ORDER — CHLORHEXIDINE GLUCONATE CLOTH 2 % EX PADS: 6.0000 | MEDICATED_PAD | Freq: Every day | CUTANEOUS | Status: DC

## 2021-04-30 MED ORDER — FENTANYL CITRATE (PF) 100 MCG/2ML IJ SOLN
INTRAMUSCULAR | Status: AC
  Filled 2021-04-30: qty 4

## 2021-04-30 MED ORDER — FENTANYL CITRATE (PF) 100 MCG/2ML IJ SOLN
INTRAMUSCULAR | Status: AC | PRN
  Administered 2021-04-30: 50 ug via INTRAVENOUS
  Administered 2021-04-30: 25 ug via INTRAVENOUS

## 2021-04-30 MED ORDER — HEPARIN SOD (PORK) LOCK FLUSH 100 UNIT/ML IV SOLN
500.0000 [IU] | INTRAVENOUS | Status: AC | PRN
  Administered 2021-04-30: 500 [IU]
  Filled 2021-04-30: qty 5

## 2021-04-30 NOTE — Progress Notes (Signed)
Pt does not speak english, I changed her medical record to reflect arabic. Her daughter is here and will use her assistance till consent needed. I attempted 1 iv in left hand, I was able to get cbc drawn but not the pt/inr, I placed an [order for [employer] and paged Michela Pitcher PA  to inform of patients needs, I updated [place] [facility].

## 2021-04-30 NOTE — Progress Notes (Signed)
Angela Gambles, MD paged in regards to the timing of resuming patients Lovenox. MD stated to resume Lovenox injections this afternoon. Patient and family notified and verbalized understanding.

## 2021-04-30 NOTE — Progress Notes (Signed)
Oncology Nurse Navigator Documentation  Oncology Nurse Navigator Flowsheets 04/30/2021  Confirmed Diagnosis Date -  Diagnosis Status -  Planned Course of Treatment -  Phase of Treatment -  Chemotherapy Pending- Reason: -  Chemotherapy Actual Start Date: -  Navigator Follow Up Date: 05/04/2021  Navigator Follow Up Reason: Pathology  Navigator Location [facility]  Referral Date to RadOnc/MedOnc -  Navigator Encounter Type Appt/Treatment Plan Review  Telephone -  Treatment Initiated Date -  Patient Visit Type MedOnc  Treatment Phase Active Tx  Barriers/Navigation Needs Coordination of Care;Cultural Needs;Family Concerns;Language/Communication;Education  Education -  Interventions None Required  Acuity Level 2-Minimal Needs (1-2 Barriers Identified)  Referrals -  Coordination of Care -  Education Method -  Support Groups/Services Friends and Family  Time Spent with Patient 15

## 2021-04-30 NOTE — Procedures (Signed)
  Procedure: CT core biopsy anterior peritoneal mass 18g x4 EBL:   minimal Complications:  none immediate  See full dictation in [employer].  Dillard Cannon MD Main # [phone] Pager  [phone]

## 2021-04-30 NOTE — H&P (Signed)
Chief Complaint: Patient was seen in consultation today for peritoneal mass biopsy  Referring Physician(s): Volanda Napoleon  Supervising Physician: Arne Cleveland  Patient Status: [facility] - Out-pt  History of Present Illness: Angela Wilson is a 55 y.o. female with a medical history significant for DM, HTN and metastatic pancreatic cancer, diagnosed May 2021 and currently undergoing therapy. She is familiar to IR from a thoracentesis and port-a-catheter placement May 2021. She presented to the ED 04/20/21 with neck pain and concern for upper extremity DVT and/or PE. Imaging obtained showed a thrombus in the right IJ vein as well as disease progression.   CT abdomen/pelvis 04/20/21 Stomach/Bowel: Serosal implants involving the transverse and sigmoid mesocolon are again identified. No evidence of obstruction. The stomach, small bowel, and large bowel are otherwise unremarkable. The appendix is not clearly identified, however, there are no secondary signs of appendicitis within the right lower quadrant. Trace ascites appears slightly increased since prior examination. Multiple peritoneal implants are again identified, most prominently within the periumbilical region and appear grossly stable since prior examination. No free intraperitoneal gas. IMPRESSION: 1. Technically limited examination. The examination is essentially nondiagnostic for the assessment for pulmonary embolism. No pulmonary arterial enlargement or CT evidence of right heart strain. 2. Interval development of a moderate right pleural effusion likely related to diaphragmatic involvement by hepatic dome serosal implants. 3. Interval disease progression. 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 Slight interval increase in ascites.   Interventional Radiology has been asked to evaluate this patient for an  image-guided peritoneal mass biopsy for further work-up including molecular testing. This case was reviewed and procedure approved by Dr. Serafina Royals.   Past Medical History:  Diagnosis Date   Diabetes mellitus without complication ([place])    Family history of brain cancer    Family history of breast cancer    Family history of kidney cancer    Family history of ovarian cancer    GERD (gastroesophageal reflux disease)    Hyperlipidemia    Hypertension    Kidney stone    Pancreatic cancer ([place]) 02/03/2020   Shingles    Type 2 diabetes mellitus ([place])     Past Surgical History:  Procedure Laterality Date   CESAREAN SECTION     x 4   IR IMAGING GUIDED PORT INSERTION  02/18/2020   KIDNEY STONE SURGERY      Allergies: Ivp dye [iodinated diagnostic agents], Morphine and related, and Penicillins  Medications: Prior to Admission medications   Medication Sig Start Date End Date Taking? Authorizing Provider  cetirizine (ZYRTEC) 10 MG tablet Take 1 tablet (10 mg total) by mouth daily. Patient taking differently: Take 10 mg by mouth daily as needed for allergies. 04/06/20  Yes Jacelyn Pi, Lilia Argue, MD  dicyclomine (BENTYL) 10 MG/5ML solution Take 10 mLs (20 mg total) by mouth 4 (four) times daily -  before meals and at bedtime. 04/22/21  Yes Volanda Napoleon, MD  dronabinol (MARINOL) 2.5 MG capsule Take 1 capsule (2.5 mg total) by mouth 2 (two) times daily before lunch and supper. 02/19/21  Yes Volanda Napoleon, MD  enoxaparin (LOVENOX) 100 MG/ML injection Inject 0.95 mLs (95 mg total) into the skin every 12 (twelve) hours. 04/21/21  Yes Delo, Nathaneil Canary, MD  fentaNYL (DURAGESIC) 75 MCG/HR Place 1 patch onto the skin every other day. 04/01/21  Yes [place], Holli Humbles, NP  glipiZIDE (GLUCOTROL) 10 MG tablet  Take 1 tablet (10 mg total) by mouth 2 (two) times daily. 02/03/21  Yes Ennever, Rudell Cobb, MD  HYDROcodone bit-homatropine (HYCODAN) 5-1.5 MG/5ML syrup Take 5 mLs by mouth every 6 (six) hours as needed for  cough. 03/04/21  Yes Ennever, Rudell Cobb, MD  lidocaine-prilocaine (EMLA) cream APPLY TO PORT 1 HOUR BEFORE ACCESS. 02/17/21 02/17/22 Yes Ennever, Rudell Cobb, MD  LORazepam (ATIVAN) 0.5 MG tablet Take 0.5 mg every six hours as needed for nausea. 01/27/21  Yes Volanda Napoleon, MD  metFORMIN (GLUCOPHAGE XR) 500 MG 24 hr tablet Take 1 tablet (500 mg total) by mouth daily with breakfast. 10/07/20  Yes Just, Laurita Quint, FNP  Multiple Vitamin (MULTIVITAMIN WITH MINERALS) TABS tablet Take 1 tablet by mouth daily.   Yes [provider]  omeprazole (PRILOSEC) 40 MG capsule Take 40 mg by mouth daily as needed (acid reflux). 04/22/21  Yes [provider]  ondansetron (ZOFRAN) 4 MG tablet Take 1 tablet (4 mg total) by mouth every 6 (six) hours as needed for nausea. 02/26/20  Yes Volanda Napoleon, MD  oxyCODONE (OXY IR/ROXICODONE) 5 MG immediate release tablet Take 1 tablet (5 mg total) by mouth every 4 (four) hours as needed for severe pain. 02/17/21  Yes Ennever, Rudell Cobb, MD  polyethylene glycol (MIRALAX / GLYCOLAX) 17 g packet Take 17 g by mouth daily as needed. 02/18/20  Yes Alma Friendly, MD  prochlorperazine (COMPAZINE) 10 MG tablet Take 1 tablet (10 mg total) by mouth every 6 (six) hours as needed for nausea or vomiting. 03/10/20  Yes Ennever, Rudell Cobb, MD  Pyridoxine HCl (VITAMIN B-6) 250 MG tablet Take 1 tablet (250 mg total) by mouth daily. 06/29/20  Yes Ennever, Rudell Cobb, MD  albuterol (PROVENTIL HFA;VENTOLIN HFA) 108 (90 Base) MCG/ACT inhaler Inhale 2 puffs into the lungs every 4 (four) hours as needed for wheezing or shortness of breath. 04/01/16   Janne Napoleon, NP  belladonna-PHENObarbital (DONNATAL) 16.2 MG/5ML ELIX Take 5 mLs (16.2 mg total) by mouth 4 (four) times daily. Patient not taking: No sig reported 04/22/21   Volanda Napoleon, MD  capecitabine (XELODA) 500 MG tablet Take 3 tablets (1,500 mg total) by mouth 2 (two) times daily after a meal. Take for 14 days on, 7 days off. Repeat every 21  days. Patient not taking: No sig reported 03/30/21   Volanda Napoleon, MD  dexamethasone (DECADRON) 4 MG tablet Take 1 tablet (4 mg total) by mouth 2 (two) times daily with a meal. For three (3) days following chemotherapy. Patient not taking: No sig reported 03/26/21   Volanda Napoleon, MD  dicyclomine (BENTYL) 10 MG/5ML solution Take 5 mLs (10 mg total) by mouth 4 (four) times daily -  before meals and at bedtime. Patient not taking: No sig reported 04/22/21   Volanda Napoleon, MD  ergocalciferol (VITAMIN D2) 1.25 MG (50000 UT) capsule Take 1 capsule (50,000 Units total) by mouth once a week. Patient not taking: No sig reported 10/07/20   Just, Laurita Quint, FNP  gabapentin (NEURONTIN) 300 MG capsule TAKE 2 CAPSULES BY MOUTH IN THE DAYTIME AND 2 CAPSULES AT BEDTIME Patient not taking: No sig reported 12/08/20 12/08/21  Volanda Napoleon, MD  PB-Hyoscy-Atropine-Scopolamine (PHENOBARBITAL-BELLADONNA ALK) 16.2 MG/5ML ELIX TAKE 5 MLS (16.2 MG TOTAL) BY MOUTH 4 (FOUR) TIMES DAILY. Patient not taking: No sig reported 10/27/20 10/27/21  Volanda Napoleon, MD  rivaroxaban (XARELTO) 10 MG TABS tablet Take 1 tablet (10 mg total) by mouth  daily. 07/21/20 10/27/20  Volanda Napoleon, MD     Family History  Problem Relation Age of Onset   Heart attack Maternal Uncle    Breast cancer Paternal Aunt        dx. in her 52s   Brain cancer Paternal Uncle 3   Kidney cancer Cousin        dx. <50   Ovarian cancer Cousin    Breast cancer Cousin        dx. "young"   Ovarian cancer Cousin    Cancer Cousin        Cancer on the back, dx. in her 107s   Lung cancer Cousin     Social History   Socioeconomic History   Marital status: Divorced    Spouse name: Not on file   Number of children: Not on file   Years of education: Not on file   Highest education level: Not on file  Occupational History   Not on file  Tobacco Use   Smoking status: Never   Smokeless tobacco: Never  Vaping Use   Vaping Use: Never used  Substance  and Sexual Activity   Alcohol use: No   Drug use: Never   Sexual activity: Not on file  Other Topics Concern   Not on file  Social History Narrative   ** Merged History Encounter **       Social Determinants of Health   Financial Resource Strain: Not on file  Food Insecurity: Not on file  Transportation Needs: Not on file  Physical Activity: Not on file  Stress: Not on file  Social Connections: Not on file    Review of Systems: A 12 point ROS discussed and pertinent positives are indicated in the HPI above.  All other systems are negative.  Review of Systems  Constitutional:  Positive for appetite change and fatigue.  Respiratory:  Negative for cough and shortness of breath.   Cardiovascular:  Negative for chest pain and leg swelling.  Gastrointestinal:  Positive for abdominal pain and nausea. Negative for diarrhea and vomiting.  Neurological:  Positive for dizziness. Negative for headaches.   Vital Signs: BP 118/71 (BP Location: Left Arm)   Pulse 98   Temp 98.1 F (36.7 C) (Oral)   Resp 18   Ht 5' 6"  (1.676 m)   Wt 202 lb (91.6 kg)   SpO2 98%   BMI 32.60 kg/m   Physical Exam Constitutional:      General: She is not in acute distress.    Appearance: She is not ill-appearing.  HENT:     Mouth/Throat:     Mouth: Mucous membranes are moist.     Pharynx: Oropharynx is clear.  Cardiovascular:     Rate and Rhythm: Normal rate and regular rhythm.     Pulses: Normal pulses.     Heart sounds: Normal heart sounds.     Comments: Right upper chest port-a-catheter, currently accessed.  Pulmonary:     Effort: Pulmonary effort is normal.     Breath sounds: Normal breath sounds.  Abdominal:     Comments: Diffuse abdominal tenderness. Multiple areas of "hard" tissue palpated. LLQ 75 mg fentanyl patch in place.   Skin:    General: Skin is warm and dry.  Neurological:     Mental Status: She is alert and oriented to person, place, and time.    Imaging: CT Angio Chest PE  W and/or Wo Contrast  Result Date: 04/21/2021 CLINICAL DATA:  PE suspected, high  prob; Abdominal pain, acute, nonlocalized PE suspected and acute abdominal pain. Hx diabetes, GERD, HTN, kidney stone, and pancreatic cancer. EXAM: CT ANGIOGRAPHY CHEST CT ABDOMEN AND PELVIS WITH CONTRAST TECHNIQUE: Multidetector CT imaging of the chest was performed using the standard protocol during bolus administration of intravenous contrast. Multiplanar CT image reconstructions and MIPs were obtained to evaluate the vascular anatomy. Multidetector CT imaging of the abdomen and pelvis was performed using the standard protocol during bolus administration of intravenous contrast. Due to multiple technical difficulties with access, bolus injection was stopped twice resulting in suboptimal opacification of the pulmonary arterial tree. CONTRAST:  153m OMNIPAQUE IOHEXOL 350 MG/ML SOLN COMPARISON:  01/27/2021 FINDINGS: CTA CHEST FINDINGS Cardiovascular: There is inadequate opacification of the pulmonary arterial tree to evaluate for the presence of a intraluminal filling defect the pulmonary arterial tree is of normal caliber. Global cardiac size within normal limits. No pericardial effusion. The thoracic aorta is unremarkable. Bovine arch anatomy with wide patency of the arch vasculature proximally. Right internal jugular chest port tip is seen within the right atrium. Mediastinum/Nodes: No pathologic thoracic adenopathy. Esophagus unremarkable. Lungs/Pleura: Moderate right pleural effusion is present, new since prior examination with compressive atelectasis of the right lung. Lungs are otherwise clear. No pneumothorax. No pleural effusion on the left. Central airways are widely patent. Musculoskeletal: No lytic or blastic bone lesions. No acute bone abnormality. Review of the MIP images confirms the above findings. CT ABDOMEN and PELVIS FINDINGS Hepatobiliary: Multiple hepatic metastases are again identified at the hepatic dome, best  seen on axial image # 31 and coronal reformats involving the right hemidiaphragm these are better visualized than on prior examination due to contrast administration, but appears grossly stable. No intra or extrahepatic biliary ductal dilation. Gallbladder absent. Pancreas: There is again noted mild fullness of the tail of the pancreas, however, a discrete mass is not clearly identified. No pancreatic ductal dilation. No peripancreatic inflammatory changes identified. Spleen: Unremarkable Adrenals/Urinary Tract: The adrenal glands are unremarkable. The kidneys are normal in size and position. 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 There has developed mild distension of the renal pelves without associated caliectasis which may simply relate to aggressive hydration. The kidneys are otherwise unremarkable. The bladder is unremarkable. Stomach/Bowel: Serosal implants involving the transverse and sigmoid mesocolon are again identified. No evidence of obstruction. The stomach, small bowel, and large bowel are otherwise unremarkable. The appendix is not clearly identified, however, there are no secondary signs of appendicitis within the right lower quadrant. Trace ascites appears slightly increased since prior examination. Multiple peritoneal implants are again identified, most prominently within the periumbilical region and appear grossly stable since prior examination. No free intraperitoneal gas. Vascular/Lymphatic: No pathologic adenopathy within the abdomen and pelvis. The abdominal vasculature is unremarkable. Reproductive: The uterus is unremarkable. 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 Cystic collection within the right adnexa, by example, has significantly increased in overall dimension since prior examination, measuring 7.5 x 10.6 x 10.5 cm in greatest dimension  (previously measuring 6.4 x 11.5 by 7.8 cm in greatest dimension). Other: No abdominal wall hernia Musculoskeletal: No acute bone abnormality. 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 Review of the MIP images confirms the above findings. IMPRESSION: Technically limited examination. The examination is essentially nondiagnostic for the assessment for pulmonary embolism. No pulmonary arterial enlargement or CT evidence  of right heart strain. Interval development of a moderate right pleural effusion likely related to diaphragmatic involvement by hepatic dome serosal implants. Interval disease progression. 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 Slight interval increase in ascites. Electronically Signed   By: Fidela Salisbury MD   On: 04/21/2021 00:36   US Soft Tissue Head/Neck  Result Date: 04/21/2021 CLINICAL DATA:  55 year old female with right-sided neck pain. History of ovarian and pancreatic malignancy EXAM: ULTRASOUND OF HEAD/NECK SOFT TISSUES TECHNIQUE: Ultrasound examination of the head and neck soft tissues was performed in the area of clinical concern. COMPARISON:  CTA PE, 04/21/2021.  IR port placement, 02/18/2020 FINDINGS: Directed sonographic evaluation of the right neck in area of palpable concern demonstrated occlusive right internal jugular vein thrombus, without flow by Doppler evaluation. Noting this area of thrombus is cephalad to right IJ port catheter IMPRESSION: Occlusive right IJ vein thrombus, in the setting of ipsilateral port catheter. Suspect catheter-related thrombosis. Patient was escorted by the sonographer to the emergency room for further evaluation. Michaelle Birks, MD [facility] Radiology Electronically Signed   By: Michaelle Birks MD   On: 04/21/2021  08:09   CT ABDOMEN PELVIS W CONTRAST  Result Date: 04/21/2021 CLINICAL DATA:  PE suspected, high prob; Abdominal pain, acute, nonlocalized PE suspected and acute abdominal pain. Hx diabetes, GERD, HTN, kidney stone, and pancreatic cancer. EXAM: CT ANGIOGRAPHY CHEST CT ABDOMEN AND PELVIS WITH CONTRAST TECHNIQUE: Multidetector CT imaging of the chest was performed using the standard protocol during bolus administration of intravenous contrast. Multiplanar CT image reconstructions and MIPs were obtained to evaluate the vascular anatomy. Multidetector CT imaging of the abdomen and pelvis was performed using the standard protocol during bolus administration of intravenous contrast. Due to multiple technical difficulties with access, bolus injection was stopped twice resulting in suboptimal opacification of the pulmonary arterial tree. CONTRAST:  16m OMNIPAQUE IOHEXOL 350 MG/ML SOLN COMPARISON:  01/27/2021 FINDINGS: CTA CHEST FINDINGS Cardiovascular: There is inadequate opacification of the pulmonary arterial tree to evaluate for the presence of a intraluminal filling defect the pulmonary arterial tree is of normal caliber. Global cardiac size within normal limits. No pericardial effusion. The thoracic aorta is unremarkable. Bovine arch anatomy with wide patency of the arch vasculature proximally. Right internal jugular chest port tip is seen within the right atrium. Mediastinum/Nodes: No pathologic thoracic adenopathy. Esophagus unremarkable. Lungs/Pleura: Moderate right pleural effusion is present, new since prior examination with compressive atelectasis of the right lung. Lungs are otherwise clear. No pneumothorax. No pleural effusion on the left. Central airways are widely patent. Musculoskeletal: No lytic or blastic bone lesions. No acute bone abnormality. Review of the MIP images confirms the above findings. CT ABDOMEN and PELVIS FINDINGS Hepatobiliary: Multiple hepatic metastases are again identified at the  hepatic dome, best seen on axial image # 31 and coronal reformats involving the right hemidiaphragm these are better visualized than on prior examination due to contrast administration, but appears grossly stable. No intra or extrahepatic biliary ductal dilation. Gallbladder absent. Pancreas: There is again noted mild fullness of the tail of the pancreas, however, a discrete mass is not clearly identified. No pancreatic ductal dilation. No peripancreatic inflammatory changes identified. Spleen: Unremarkable Adrenals/Urinary Tract: The adrenal glands are unremarkable. The kidneys are normal in size and position. 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 There has developed mild distension of the renal pelves without associated  caliectasis which may simply relate to aggressive hydration. The kidneys are otherwise unremarkable. The bladder is unremarkable. Stomach/Bowel: Serosal implants involving the transverse and sigmoid mesocolon are again identified. No evidence of obstruction. The stomach, small bowel, and large bowel are otherwise unremarkable. The appendix is not clearly identified, however, there are no secondary signs of appendicitis within the right lower quadrant. Trace ascites appears slightly increased since prior examination. Multiple peritoneal implants are again identified, most prominently within the periumbilical region and appear grossly stable since prior examination. No free intraperitoneal gas. Vascular/Lymphatic: No pathologic adenopathy within the abdomen and pelvis. The abdominal vasculature is unremarkable. Reproductive: The uterus is unremarkable. 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 Cystic collection within the right adnexa, by example, has significantly increased in overall dimension since prior examination, measuring 7.5 x 10.6 x 10.5 cm in  greatest dimension (previously measuring 6.4 x 11.5 by 7.8 cm in greatest dimension). Other: No abdominal wall hernia Musculoskeletal: No acute bone abnormality. 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 Review of the MIP images confirms the above findings. IMPRESSION: Technically limited examination. The examination is essentially nondiagnostic for the assessment for pulmonary embolism. No pulmonary arterial enlargement or CT evidence of right heart strain. Interval development of a moderate right pleural effusion likely related to diaphragmatic involvement by hepatic dome serosal implants. Interval disease progression. 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 Slight interval increase in ascites. Electronically Signed   By: Fidela Salisbury MD   On: 04/21/2021 00:36   US Venous Img Upper Uni Right(DVT)  Result Date: 04/20/2021 CLINICAL DATA:  Right neck pain, metastatic pancreatic cancer EXAM: RIGHT UPPER EXTREMITY VENOUS DOPPLER ULTRASOUND TECHNIQUE: Gray-scale sonography with graded compression, as well as color Doppler and duplex ultrasound were performed to evaluate the upper extremity deep venous system from the level of the subclavian vein and including the jugular, axillary, basilic, radial, ulnar and upper cephalic vein. Spectral Doppler was utilized to evaluate flow at rest and with distal augmentation maneuvers. COMPARISON:  01/27/2021 FINDINGS: Contralateral Subclavian Vein: Respiratory phasicity is normal and symmetric with the symptomatic side. No evidence of thrombus. Normal compressibility. Internal Jugular Vein: There is occlusive thrombus within the right internal jugular vein, extending into the right innominate vein. Subclavian Vein: No evidence of thrombus. Normal compressibility, respiratory phasicity and  response to augmentation. Axillary Vein: No evidence of thrombus. Normal compressibility, respiratory phasicity and response to augmentation. Cephalic Vein: No evidence of thrombus. Normal compressibility, respiratory phasicity and response to augmentation. Basilic Vein: No evidence of thrombus. Normal compressibility, respiratory phasicity and response to augmentation. Brachial Veins: No evidence of thrombus. Normal compressibility, respiratory phasicity and response to augmentation. Radial Veins: No evidence of thrombus. Normal compressibility, respiratory phasicity and response to augmentation. Ulnar Veins: No evidence of thrombus. Normal compressibility, respiratory phasicity and response to augmentation. IMPRESSION: 1. 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 There is an indwelling right chest wall port via internal jugular approach. These results will be called to the ordering clinician or representative by the Radiologist Assistant, and communication documented in the PACS or [employer]. Electronically Signed   By: Randa Ngo M.D.   On: 04/20/2021 19:11    Labs:  CBC: Recent Labs  04/15/21 0900 04/20/21 1833 04/22/21 1314 04/27/21 1124  WBC 5.3 3.0* 3.4* 5.1  HGB 10.6* 11.4* 10.3* 10.8*  HCT 33.1* 35.0* 31.7* 33.9*  PLT 362 314 198 182    COAGS: No results for input(s): INR, APTT in the last 8760 hours.  BMP: Recent Labs    05/27/20 0859 06/03/20 0930 06/15/20 1129 06/22/20 0900 06/29/20 0908 04/15/21 0900 04/20/21 1833 04/22/21 1314 04/27/21 1124  NA 136 136 135 135   < > 136 134* 135 136  K 4.1 4.4 4.3 4.2   < > 4.1 4.3 3.9 4.3  CL 102 102 103 103   < > 101 101 101 102  CO2 26 27 26 25    < > 28 21* 27 29  GLUCOSE 176* 184* 153* 174*   < > 102* 153* 194* 112*  BUN 14 14 15 10    < > 14 11 15 10   CALCIUM 9.4 9.1 9.1 9.1   < > 9.8 8.6* 9.0 9.2  CREATININE 0.60 0.58 0.61 0.68   < > 0.60 0.47 0.54 0.60   GFRNONAA >60 >60 >60 >60   < > >60 >60 >60 >60  GFRAA >60 >60 >60 >60  --   --   --   --   --    < > = values in this interval not displayed.    LIVER FUNCTION TESTS: Recent Labs    04/15/21 0900 04/20/21 1833 04/22/21 1314 04/27/21 1124  BILITOT 0.4 0.7 0.6 0.6  AST 15 44* 20 18  ALT 11 28 18 14   ALKPHOS 53 53 57 50  PROT 7.1 7.7 6.8 6.7  ALBUMIN 3.7 3.5 3.5 3.5    TUMOR MARKERS: No results for input(s): AFPTM, CEA, CA199, CHROMGRNA in the last 8760 hours.  Assessment and Plan:  Metastatic pancreatic cancer; disease progression; tissue sample needed for molecular testing: Angela Wilson, 55 year old female, presents today to the [place] [facility] department for an image-guided peritoneal mass biopsy. The procedure was discussed and written consent obtained with the assistance of an Arabic-speaking video interpreter. The patient states her daughter will drive her home after the procedure.   Risks and benefits of this procedure were discussed with the patient and/or patient's family including, but not limited to bleeding, infection, damage to adjacent structures or low yield requiring additional tests.  All of the questions were answered and there is agreement to proceed. She has been NPO and has held her lovenox for one day.   Consent signed and in chart.  Thank you for this interesting consult.  I greatly enjoyed meeting Areesha Kasinger and look forward to participating in their care.  A copy of this report was sent to the requesting provider on this date.  Electronically Signed: Soyla Dryer, AGACNP-BC [phone] 04/30/2021, 10:52 AM   I spent a total of  30 Minutes   in face to face in clinical consultation, greater than 50% of which was counseling/coordinating care for image-guided peritoneal mass biopsy

## 2021-05-03 LAB — SURGICAL PATHOLOGY

## 2021-05-04 ENCOUNTER — Other Ambulatory Visit: Payer: 59

## 2021-05-04 ENCOUNTER — Other Ambulatory Visit (HOSPITAL_BASED_OUTPATIENT_CLINIC_OR_DEPARTMENT_OTHER): Payer: Self-pay

## 2021-05-04 ENCOUNTER — Encounter: Payer: Self-pay | Admitting: *Deleted

## 2021-05-04 ENCOUNTER — Other Ambulatory Visit: Payer: Self-pay | Admitting: *Deleted

## 2021-05-04 ENCOUNTER — Inpatient Hospital Stay: Payer: 59

## 2021-05-04 ENCOUNTER — Ambulatory Visit: Payer: 59

## 2021-05-04 ENCOUNTER — Other Ambulatory Visit: Payer: Self-pay

## 2021-05-04 VITALS — BP 127/59 | HR 74 | Temp 98.4°F | Resp 20

## 2021-05-04 DIAGNOSIS — C252 Malignant neoplasm of tail of pancreas: Secondary | ICD-10-CM

## 2021-05-04 DIAGNOSIS — I8290 Acute embolism and thrombosis of unspecified vein: Secondary | ICD-10-CM

## 2021-05-04 DIAGNOSIS — Z5111 Encounter for antineoplastic chemotherapy: Secondary | ICD-10-CM | POA: Diagnosis not present

## 2021-05-04 DIAGNOSIS — C251 Malignant neoplasm of body of pancreas: Secondary | ICD-10-CM

## 2021-05-04 DIAGNOSIS — Z95828 Presence of other vascular implants and grafts: Secondary | ICD-10-CM

## 2021-05-04 LAB — CBC WITH DIFFERENTIAL (CANCER CENTER ONLY)
Abs Immature Granulocytes: 0.06 10*3/uL (ref 0.00–0.07)
Basophils Absolute: 0 10*3/uL (ref 0.0–0.1)
Basophils Relative: 1 %
Eosinophils Absolute: 0.2 10*3/uL (ref 0.0–0.5)
Eosinophils Relative: 3 %
HCT: 35.5 % — ABNORMAL LOW (ref 36.0–46.0)
Hemoglobin: 11.3 g/dL — ABNORMAL LOW (ref 12.0–15.0)
Immature Granulocytes: 1 %
Lymphocytes Relative: 26 %
Lymphs Abs: 1.6 10*3/uL (ref 0.7–4.0)
MCH: 29.7 pg (ref 26.0–34.0)
MCHC: 31.8 g/dL (ref 30.0–36.0)
MCV: 93.2 fL (ref 80.0–100.0)
Monocytes Absolute: 0.4 10*3/uL (ref 0.1–1.0)
Monocytes Relative: 7 %
Neutro Abs: 3.7 10*3/uL (ref 1.7–7.7)
Neutrophils Relative %: 62 %
Platelet Count: 207 10*3/uL (ref 150–400)
RBC: 3.81 MIL/uL — ABNORMAL LOW (ref 3.87–5.11)
RDW: 19.3 % — ABNORMAL HIGH (ref 11.5–15.5)
WBC Count: 6 10*3/uL (ref 4.0–10.5)
nRBC: 0 % (ref 0.0–0.2)

## 2021-05-04 LAB — CMP (CANCER CENTER ONLY)
ALT: 11 U/L (ref 0–44)
AST: 12 U/L — ABNORMAL LOW (ref 15–41)
Albumin: 3.6 g/dL (ref 3.5–5.0)
Alkaline Phosphatase: 45 U/L (ref 38–126)
Anion gap: 8 (ref 5–15)
BUN: 12 mg/dL (ref 6–20)
CO2: 27 mmol/L (ref 22–32)
Calcium: 9.4 mg/dL (ref 8.9–10.3)
Chloride: 102 mmol/L (ref 98–111)
Creatinine: 0.61 mg/dL (ref 0.44–1.00)
GFR, Estimated: >60 mL/min (ref >60)
Glucose, Bld: 162 mg/dL — ABNORMAL HIGH (ref 70–99)
Potassium: 4.2 mmol/L (ref 3.5–5.1)
Sodium: 137 mmol/L (ref 135–145)
Total Bilirubin: 0.5 mg/dL (ref 0.3–1.2)
Total Protein: 6.4 g/dL — ABNORMAL LOW (ref 6.5–8.1)

## 2021-05-04 MED ORDER — LEUCOVORIN CALCIUM INJECTION 350 MG
200.0000 mg/m2 | Freq: Once | INTRAVENOUS | Status: AC
  Administered 2021-05-04: 412 mg via INTRAVENOUS
  Filled 2021-05-04: qty 20.6

## 2021-05-04 MED ORDER — SODIUM CHLORIDE 0.9% FLUSH
10.0000 mL | Freq: Once | INTRAVENOUS | Status: AC
  Administered 2021-05-04: 10 mL via INTRAVENOUS

## 2021-05-04 MED ORDER — PROCHLORPERAZINE MALEATE 10 MG PO TABS
10.0000 mg | ORAL_TABLET | Freq: Once | ORAL | Status: AC
  Administered 2021-05-04: 10 mg via ORAL
  Filled 2021-05-04: qty 1

## 2021-05-04 MED ORDER — SODIUM CHLORIDE 0.9% FLUSH: 10.0000 mL | INTRAVENOUS | Status: DC | PRN

## 2021-05-04 MED ORDER — SODIUM CHLORIDE 0.9 % IV SOLN: Freq: Once | INTRAVENOUS | Status: AC

## 2021-05-04 MED ORDER — HYDROMORPHONE HCL 4 MG/ML IJ SOLN
4.0000 mg | Freq: Once | INTRAMUSCULAR | Status: AC
Start: 2021-05-04 — End: 2021-05-04
  Administered 2021-05-04: 4 mg via INTRAVENOUS
  Filled 2021-05-04: qty 1

## 2021-05-04 MED ORDER — ENOXAPARIN SODIUM 100 MG/ML IJ SOSY
1.0000 mg/kg | PREFILLED_SYRINGE | Freq: Two times a day (BID) | INTRAMUSCULAR | 0 refills | Status: DC
  Filled 2021-05-04: qty 60, 32d supply, fill #0

## 2021-05-04 MED ORDER — HEPARIN SOD (PORK) LOCK FLUSH 100 UNIT/ML IV SOLN: 500.0000 [IU] | Freq: Once | INTRAVENOUS | Status: DC | PRN

## 2021-05-04 MED ORDER — SODIUM CHLORIDE 0.9 % IV SOLN
1200.0000 mg/m2 | INTRAVENOUS | Status: DC
  Administered 2021-05-04: 2450 mg via INTRAVENOUS
  Filled 2021-05-04 (×2): qty 49

## 2021-05-04 MED ORDER — FENTANYL 75 MCG/HR TD PT72
1.0000 | MEDICATED_PATCH | TRANSDERMAL | 0 refills | Status: DC
  Filled 2021-05-04: qty 15, 30d supply, fill #0

## 2021-05-04 NOTE — Progress Notes (Signed)
Per Dr Antonieta Pert request, oncomap testing request sent on specimen [id number] DOS 04/30/2021.  Oncology Nurse Navigator Documentation  Oncology Nurse Navigator Flowsheets 05/04/2021  Confirmed Diagnosis Date -  Diagnosis Status -  Planned Course of Treatment -  Phase of Treatment -  Chemotherapy Pending- Reason: -  Chemotherapy Actual Start Date: -  Navigator Follow Up Date: 05/12/2021  Navigator Follow Up Reason: Follow-up Appointment;Chemotherapy  Navigator Location [facility]  Referral Date to RadOnc/MedOnc -  Navigator Encounter Type Molecular Studies  Telephone -  Treatment Initiated Date -  Patient Visit Type MedOnc  Treatment Phase Active Tx  Barriers/Navigation Needs Coordination of Care;Cultural Needs;Family Concerns;Language/Communication;Education  Education -  Interventions Coordination of Care  Acuity Level 2-Minimal Needs (1-2 Barriers Identified)  Referrals -  Coordination of Care Pathology  Education Method -  Support Groups/Services Friends and Family  Time Spent with Patient 64

## 2021-05-04 NOTE — Patient Instructions (Signed)
Tunneled Central Venous Catheter Flushing Guide It is important to flush your tunneled central venous catheter each time you use it, both before and after you use it. Flushing your catheter will help prevent it from clogging. What are the risks? Risks may include: Infection. Air getting into the catheter and bloodstream. Supplies needed: A clean pair of gloves. A disinfecting wipe. Use an alcohol wipe, chlorhexidine wipe, or iodine wipe as told by your health care provider. A 10 mL syringe that has been prefilled with saline solution. An empty 10 mL syringe, if a substance called heparin was injected into your catheter. How to flush your catheter When you flush your catheter, make sure you follow any specific instructions from your health care provider or the manufacturer. These are general guidelines. Flushing your catheter before use If there is heparin in your catheter: Wash your hands with soap and water. Put on gloves. Scrub the injection cap for a minimum of 15 seconds with a disinfecting wipe. Unclamp the catheter. Attach the empty syringe to the injection cap. Pull the syringe plunger back and withdraw 10 mL of blood. Place the syringe into an appropriate waste container. Scrub the injection cap for 15 seconds with a disinfecting wipe. Attach the prefilled syringe to the injection cap. Flush the catheter by pushing the plunger forward until all the liquid from the syringe is in the catheter. Remove the syringe from the injection cap. Clamp the catheter. If there is no heparin in your catheter: Wash your hands with soap and water. Put on gloves. Scrub the injection cap for 15 seconds with a disinfecting wipe. Unclamp the catheter. Attach the prefilled syringe to the injection cap. Flush the catheter by pushing the plunger forward until 5 mL of the liquid from the syringe is in the catheter. Pull back on the syringe until you see blood in the catheter. If you have been asked  to collect any blood, follow your health care provider's instructions. Otherwise, flush the catheter with the rest of the solution from the syringe. Remove the syringe from the injection cap. Clamp the catheter.  Flushing your catheter after use Wash your hands with soap and water. Put on gloves. Scrub the injection cap for 15 seconds with a disinfecting wipe. Unclamp the catheter. Attach the prefilled syringe to the injection cap. Flush the catheter by pushing the plunger forward until all of the liquid from the syringe is in the catheter. Remove the syringe from the injection cap. Clamp the catheter. Problems and solutions If blood cannot be completely cleared from the injection cap, you may need to have the injection cap replaced. If the catheter is difficult to flush, use the pulsing method. The pulsing method involves pushing only a few milliliters of solution into the catheter at a time and pausing between pushes. If you do not see blood in the catheter when you pull back on the syringe, change your body position, such as by raising your arms above your head. Take a deep breath and cough. Then, pull back on the syringe. If you still do not see blood, flush the catheter with a small amount of solution. Then, change positions again and take a breath or cough. Pull back on the syringe again. If you still do not see blood, finish flushing the catheter and contact your health care provider. Do not use your catheter until your health care provider says it is okay. General tips Have someone help you flush your catheter, if possible. Do not force fluid   through your catheter. Do not use a syringe that is larger or smaller than 10 mL. Using a smaller syringe can make the catheter burst. Do not use your catheter without flushing it first if it has heparin in it. Contact a health care provider if: You cannot see any blood in the catheter when you flush it before using it. Your catheter is difficult  to flush. Get help right away if: You cannot flush the catheter. The catheter leaks when you flush it or when there is fluid in it. There are cracks or breaks in the catheter. Summary It is important to flush your tunneled central venous catheter each time you use it, both before and after you use it. Scrub the injection cap for 15 seconds with a disinfecting wipe before and after you flush it. When you flush your catheter, make sure you follow any specific instructions from your health care provider or the manufacturer. Get help right away if you cannot flush the catheter. This information is not intended to replace advice given to you by your health care provider. Make sure you discuss any questions you have with your health care provider. Document Revised: 11/14/2019 Document Reviewed: 11/21/2018 Elsevier Patient Education  2022 [employer]  

## 2021-05-04 NOTE — Patient Instructions (Signed)
[facility]  Discharge Instructions: Thank you for choosing [facility] to provide your oncology and hematology care.   If you have a lab appointment with the [facility], please go directly to the [facility] and check in at the registration area.  Wear comfortable clothing and clothing appropriate for easy access to any Portacath or PICC line.   We strive to give you quality time with your provider. You may need to reschedule your appointment if you arrive late (15 or more minutes).  Arriving late affects you and other patients whose appointments are after yours.  Also, if you miss three or more appointments without notifying the office, you may be dismissed from the clinic at the provider's discretion.      For prescription refill requests, have your pharmacy contact our office and allow 72 hours for refills to be completed.    Today you received the following chemotherapy and/or immunotherapy agents leucovorin and Adrucil.   To help prevent nausea and vomiting after your treatment, we encourage you to take your nausea medication as directed.  BELOW ARE SYMPTOMS THAT SHOULD BE REPORTED IMMEDIATELY: *FEVER GREATER THAN 100.4 F (38 C) OR HIGHER *CHILLS OR SWEATING *NAUSEA AND VOMITING THAT IS NOT CONTROLLED WITH YOUR NAUSEA MEDICATION *UNUSUAL SHORTNESS OF BREATH *UNUSUAL BRUISING OR BLEEDING *URINARY PROBLEMS (pain or burning when urinating, or frequent urination) *BOWEL PROBLEMS (unusual diarrhea, constipation, pain near the anus) TENDERNESS IN MOUTH AND THROAT WITH OR WITHOUT PRESENCE OF ULCERS (sore throat, sores in mouth, or a toothache) UNUSUAL RASH, SWELLING OR PAIN  UNUSUAL VAGINAL DISCHARGE OR ITCHING   Items with * indicate a potential emergency and should be followed up as soon as possible or go to the Emergency Department if any problems should occur.  Please show the CHEMOTHERAPY ALERT CARD or IMMUNOTHERAPY ALERT CARD at  check-in to the Emergency Department and triage nurse. Should you have questions after your visit or need to cancel or reschedule your appointment, please contact [facility]  [phone] and follow the prompts.  Office hours are 8:00 a.m. to 4:30 p.m. Monday - Friday. Please note that voicemails left after 4:00 p.m. may not be returned until the following business day.  We are closed weekends and major holidays. You have access to a nurse at all times for urgent questions. Please call the main number to the clinic [phone] and follow the prompts.  For any non-urgent questions, you may also contact your provider using MyChart. We now offer e-Visits for anyone 60 and older to request care online for non-urgent symptoms. For details visit [email or web address].   Also download the MyChart app! Go to the app store, search "MyChart", open the app, select [facility], and log in with your MyChart username and password.  Due to Covid, a mask is required upon entering the hospital/clinic. If you do not have a mask, one will be given to you upon arrival. For doctor visits, patients may have 1 support person aged 91 or older with them. For treatment visits, patients cannot have anyone with them due to current Covid guidelines and our immunocompromised population.

## 2021-05-05 ENCOUNTER — Other Ambulatory Visit (HOSPITAL_BASED_OUTPATIENT_CLINIC_OR_DEPARTMENT_OTHER): Payer: Self-pay

## 2021-05-05 ENCOUNTER — Encounter: Payer: Self-pay | Admitting: Hematology & Oncology

## 2021-05-05 ENCOUNTER — Encounter: Payer: Self-pay | Admitting: *Deleted

## 2021-05-05 ENCOUNTER — Inpatient Hospital Stay: Payer: 59

## 2021-05-05 VITALS — BP 118/61 | HR 74 | Temp 98.4°F | Resp 16

## 2021-05-05 DIAGNOSIS — Z5111 Encounter for antineoplastic chemotherapy: Secondary | ICD-10-CM | POA: Diagnosis not present

## 2021-05-05 DIAGNOSIS — Z95828 Presence of other vascular implants and grafts: Secondary | ICD-10-CM

## 2021-05-05 MED ORDER — HEPARIN SOD (PORK) LOCK FLUSH 100 UNIT/ML IV SOLN
500.0000 [IU] | Freq: Once | INTRAVENOUS | Status: AC
  Administered 2021-05-05: 500 [IU] via INTRAVENOUS

## 2021-05-05 MED ORDER — SODIUM CHLORIDE 0.9% FLUSH
10.0000 mL | Freq: Once | INTRAVENOUS | Status: AC
  Administered 2021-05-05: 10 mL via INTRAVENOUS

## 2021-05-05 NOTE — Patient Instructions (Signed)
Implanted Port Home Guide An implanted port is a device that is placed under the skin. It is usually placed in the chest. The device can be used to give IV medicine, to take blood, or for dialysis. You may have an implanted port if: You need IV medicine that would be irritating to the small veins in your hands or arms. You need IV medicines, such as antibiotics, for a long period of time. You need IV nutrition for a long period of time. You need dialysis. When you have a port, your health care provider can choose to use the port instead of veins in your arms for these procedures. You may have fewer limitations when using a port than you would if you used other types of long-term IVs, and you will likely be able to return to normal activities afteryour incision heals. An implanted port has two main parts: Reservoir. The reservoir is the part where a needle is inserted to give medicines or draw blood. The reservoir is round. After it is placed, it appears as a small, raised area under your skin. Catheter. The catheter is a thin, flexible tube that connects the reservoir to a vein. Medicine that is inserted into the reservoir goes into the catheter and then into the vein. How is my port accessed? To access your port: A numbing cream may be placed on the skin over the port site. Your health care provider will put on a mask and sterile gloves. The skin over your port will be cleaned carefully with a germ-killing soap and allowed to dry. Your health care provider will gently pinch the port and insert a needle into it. Your health care provider will check for a blood return to make sure the port is in the vein and is not clogged. If your port needs to remain accessed to get medicine continuously (constant infusion), your health care provider will place a clear bandage (dressing) over the needle site. The dressing and needle will need to be changed every week, or as told by your health care provider. What  is flushing? Flushing helps keep the port from getting clogged. Follow instructions from your health care provider about how and when to flush the port. Ports are usually flushed with saline solution or a medicine called heparin. The need for flushing will depend on how the port is used: If the port is only used from time to time to give medicines or draw blood, the port may need to be flushed: Before and after medicines have been given. Before and after blood has been drawn. As part of routine maintenance. Flushing may be recommended every 4-6 weeks. If a constant infusion is running, the port may not need to be flushed. Throw away any syringes in a disposal container that is meant for sharp items (sharps container). You can buy a sharps container from a pharmacy, or you can make one by using an empty hard plastic bottle with a cover. How long will my port stay implanted? The port can stay in for as long as your health care provider thinks it is needed. When it is time for the port to come out, a surgery will be done to remove it. The surgery will be similar to the procedure that was done to putthe port in. Follow these instructions at home:  Flush your port as told by your health care provider. If you need an infusion over several days, follow instructions from your health care provider about how to take   care of your port site. Make sure you: Wash your hands with soap and water before you change your dressing. If soap and water are not available, use alcohol-based hand sanitizer. Change your dressing as told by your health care provider. Place any used dressings or infusion bags into a plastic bag. Throw that bag in the trash. Keep the dressing that covers the needle clean and dry. Do not get it wet. Do not use scissors or sharp objects near the tube. Keep the tube clamped, unless it is being used. Check your port site every day for signs of infection. Check for: Redness, swelling, or  pain. Fluid or blood. Pus or a bad smell. Protect the skin around the port site. Avoid wearing bra straps that rub or irritate the site. Protect the skin around your port from seat belts. Place a soft pad over your chest if needed. Bathe or shower as told by your health care provider. The site may get wet as long as you are not actively receiving an infusion. Return to your normal activities as told by your health care provider. Ask your health care provider what activities are safe for you. Carry a medical alert card or wear a medical alert bracelet at all times. This will let health care providers know that you have an implanted port in case of an emergency. Get help right away if: You have redness, swelling, or pain at the port site. You have fluid or blood coming from your port site. You have pus or a bad smell coming from the port site. You have a fever. Summary Implanted ports are usually placed in the chest for long-term IV access. Follow instructions from your health care provider about flushing the port and changing bandages (dressings). Take care of the area around your port by avoiding clothing that puts pressure on the area, and by watching for signs of infection. Protect the skin around your port from seat belts. Place a soft pad over your chest if needed. Get help right away if you have a fever or you have redness, swelling, pain, drainage, or a bad smell at the port site. This information is not intended to replace advice given to you by your health care provider. Make sure you discuss any questions you have with your healthcare provider. Document Revised: 01/20/2020 Document Reviewed: 01/20/2020 Elsevier Patient Education  2022 Elsevier Inc.  

## 2021-05-07 ENCOUNTER — Other Ambulatory Visit: Payer: Self-pay | Admitting: *Deleted

## 2021-05-07 ENCOUNTER — Encounter: Payer: Self-pay | Admitting: Hematology & Oncology

## 2021-05-07 MED ORDER — DICYCLOMINE HCL 10 MG PO CAPS: 10.0000 mg | ORAL_CAPSULE | Freq: Three times a day (TID) | ORAL | 2 refills | Status: DC

## 2021-05-07 MED ORDER — LACTULOSE 20 GM/30ML PO SOLN: 30.0000 mL | Freq: Three times a day (TID) | ORAL | 3 refills | Status: AC | PRN

## 2021-05-12 ENCOUNTER — Encounter: Payer: Self-pay | Admitting: Hematology & Oncology

## 2021-05-12 ENCOUNTER — Other Ambulatory Visit: Payer: Self-pay

## 2021-05-12 ENCOUNTER — Encounter: Payer: Self-pay | Admitting: *Deleted

## 2021-05-12 ENCOUNTER — Inpatient Hospital Stay: Payer: 59

## 2021-05-12 ENCOUNTER — Inpatient Hospital Stay (HOSPITAL_BASED_OUTPATIENT_CLINIC_OR_DEPARTMENT_OTHER): Payer: 59 | Admitting: Hematology & Oncology

## 2021-05-12 VITALS — BP 148/52 | HR 102 | Temp 99.3°F | Resp 20

## 2021-05-12 DIAGNOSIS — Z95828 Presence of other vascular implants and grafts: Secondary | ICD-10-CM

## 2021-05-12 DIAGNOSIS — C252 Malignant neoplasm of tail of pancreas: Secondary | ICD-10-CM

## 2021-05-12 DIAGNOSIS — C251 Malignant neoplasm of body of pancreas: Secondary | ICD-10-CM

## 2021-05-12 DIAGNOSIS — Z5111 Encounter for antineoplastic chemotherapy: Secondary | ICD-10-CM | POA: Diagnosis not present

## 2021-05-12 DIAGNOSIS — C253 Malignant neoplasm of pancreatic duct: Secondary | ICD-10-CM

## 2021-05-12 LAB — CMP (CANCER CENTER ONLY)
ALT: 8 U/L (ref 0–44)
AST: 13 U/L — ABNORMAL LOW (ref 15–41)
Albumin: 3.7 g/dL (ref 3.5–5.0)
Alkaline Phosphatase: 47 U/L (ref 38–126)
Anion gap: 9 (ref 5–15)
BUN: 11 mg/dL (ref 6–20)
CO2: 27 mmol/L (ref 22–32)
Calcium: 9.5 mg/dL (ref 8.9–10.3)
Chloride: 101 mmol/L (ref 98–111)
Creatinine: 0.55 mg/dL (ref 0.44–1.00)
GFR, Estimated: >60 mL/min (ref >60)
Glucose, Bld: 196 mg/dL — ABNORMAL HIGH (ref 70–99)
Potassium: 3.9 mmol/L (ref 3.5–5.1)
Sodium: 137 mmol/L (ref 135–145)
Total Bilirubin: 0.5 mg/dL (ref 0.3–1.2)
Total Protein: 7.4 g/dL (ref 6.5–8.1)

## 2021-05-12 LAB — URINALYSIS, COMPLETE (UACMP) WITH MICROSCOPIC
Bilirubin Urine: NEGATIVE
Glucose, UA: NEGATIVE mg/dL
Hgb urine dipstick: NEGATIVE
Ketones, ur: NEGATIVE mg/dL
Nitrite: NEGATIVE
Protein, ur: NEGATIVE mg/dL
Specific Gravity, Urine: 1.02 (ref 1.005–1.030)
pH: 7 (ref 5.0–8.0)

## 2021-05-12 LAB — CBC WITH DIFFERENTIAL (CANCER CENTER ONLY)
Abs Immature Granulocytes: 0.02 10*3/uL (ref 0.00–0.07)
Basophils Absolute: 0 10*3/uL (ref 0.0–0.1)
Basophils Relative: 0 %
Eosinophils Absolute: 0.2 10*3/uL (ref 0.0–0.5)
Eosinophils Relative: 4 %
HCT: 36.4 % (ref 36.0–46.0)
Hemoglobin: 11.7 g/dL — ABNORMAL LOW (ref 12.0–15.0)
Immature Granulocytes: 0 %
Lymphocytes Relative: 18 %
Lymphs Abs: 0.9 10*3/uL (ref 0.7–4.0)
MCH: 29.8 pg (ref 26.0–34.0)
MCHC: 32.1 g/dL (ref 30.0–36.0)
MCV: 92.9 fL (ref 80.0–100.0)
Monocytes Absolute: 0.3 10*3/uL (ref 0.1–1.0)
Monocytes Relative: 5 %
Neutro Abs: 3.8 10*3/uL (ref 1.7–7.7)
Neutrophils Relative %: 73 %
Platelet Count: 244 10*3/uL (ref 150–400)
RBC: 3.92 MIL/uL (ref 3.87–5.11)
RDW: 17.6 % — ABNORMAL HIGH (ref 11.5–15.5)
WBC Count: 5.3 10*3/uL (ref 4.0–10.5)
nRBC: 0 % (ref 0.0–0.2)

## 2021-05-12 MED ORDER — SODIUM CHLORIDE 0.9 % IV SOLN: Freq: Once | INTRAVENOUS | Status: AC

## 2021-05-12 MED ORDER — LEUCOVORIN CALCIUM INJECTION 350 MG
200.0000 mg/m2 | Freq: Once | INTRAVENOUS | Status: AC
  Administered 2021-05-12: 412 mg via INTRAVENOUS
  Filled 2021-05-12: qty 20.6

## 2021-05-12 MED ORDER — RIVAROXABAN 20 MG PO TABS: 20.0000 mg | ORAL_TABLET | Freq: Every day | ORAL | 6 refills | Status: AC

## 2021-05-12 MED ORDER — SODIUM CHLORIDE 0.9 % IV SOLN
1200.0000 mg/m2 | INTRAVENOUS | Status: DC
  Administered 2021-05-12: 2450 mg via INTRAVENOUS
  Filled 2021-05-12: qty 49

## 2021-05-12 MED ORDER — HYDROMORPHONE HCL 4 MG/ML IJ SOLN
4.0000 mg | Freq: Once | INTRAMUSCULAR | Status: AC
Start: 2021-05-12 — End: 2021-05-12
  Administered 2021-05-12: 4 mg via INTRAVENOUS
  Filled 2021-05-12: qty 1
  Filled 2021-05-12: qty 4

## 2021-05-12 MED ORDER — PROCHLORPERAZINE MALEATE 10 MG PO TABS
10.0000 mg | ORAL_TABLET | Freq: Once | ORAL | Status: AC
  Administered 2021-05-12: 10 mg via ORAL

## 2021-05-12 NOTE — Patient Instructions (Signed)
Implanted Port Home Guide An implanted port is a device that is placed under the skin. It is usually placed in the chest. The device can be used to give IV medicine, to take blood, or for dialysis. You may have an implanted port if: You need IV medicine that would be irritating to the small veins in your hands or arms. You need IV medicines, such as antibiotics, for a long period of time. You need IV nutrition for a long period of time. You need dialysis. When you have a port, your health care provider can choose to use the port instead of veins in your arms for these procedures. You may have fewer limitations when using a port than you would if you used other types of long-term IVs, and you will likely be able to return to normal activities afteryour incision heals. An implanted port has two main parts: Reservoir. The reservoir is the part where a needle is inserted to give medicines or draw blood. The reservoir is round. After it is placed, it appears as a small, raised area under your skin. Catheter. The catheter is a thin, flexible tube that connects the reservoir to a vein. Medicine that is inserted into the reservoir goes into the catheter and then into the vein. How is my port accessed? To access your port: A numbing cream may be placed on the skin over the port site. Your health care provider will put on a mask and sterile gloves. The skin over your port will be cleaned carefully with a germ-killing soap and allowed to dry. Your health care provider will gently pinch the port and insert a needle into it. Your health care provider will check for a blood return to make sure the port is in the vein and is not clogged. If your port needs to remain accessed to get medicine continuously (constant infusion), your health care provider will place a clear bandage (dressing) over the needle site. The dressing and needle will need to be changed every week, or as told by your health care provider. What  is flushing? Flushing helps keep the port from getting clogged. Follow instructions from your health care provider about how and when to flush the port. Ports are usually flushed with saline solution or a medicine called heparin. The need for flushing will depend on how the port is used: If the port is only used from time to time to give medicines or draw blood, the port may need to be flushed: Before and after medicines have been given. Before and after blood has been drawn. As part of routine maintenance. Flushing may be recommended every 4-6 weeks. If a constant infusion is running, the port may not need to be flushed. Throw away any syringes in a disposal container that is meant for sharp items (sharps container). You can buy a sharps container from a pharmacy, or you can make one by using an empty hard plastic bottle with a cover. How long will my port stay implanted? The port can stay in for as long as your health care provider thinks it is needed. When it is time for the port to come out, a surgery will be done to remove it. The surgery will be similar to the procedure that was done to putthe port in. Follow these instructions at home:  Flush your port as told by your health care provider. If you need an infusion over several days, follow instructions from your health care provider about how to take   care of your port site. Make sure you: Wash your hands with soap and water before you change your dressing. If soap and water are not available, use alcohol-based hand sanitizer. Change your dressing as told by your health care provider. Place any used dressings or infusion bags into a plastic bag. Throw that bag in the trash. Keep the dressing that covers the needle clean and dry. Do not get it wet. Do not use scissors or sharp objects near the tube. Keep the tube clamped, unless it is being used. Check your port site every day for signs of infection. Check for: Redness, swelling, or  pain. Fluid or blood. Pus or a bad smell. Protect the skin around the port site. Avoid wearing bra straps that rub or irritate the site. Protect the skin around your port from seat belts. Place a soft pad over your chest if needed. Bathe or shower as told by your health care provider. The site may get wet as long as you are not actively receiving an infusion. Return to your normal activities as told by your health care provider. Ask your health care provider what activities are safe for you. Carry a medical alert card or wear a medical alert bracelet at all times. This will let health care providers know that you have an implanted port in case of an emergency. Get help right away if: You have redness, swelling, or pain at the port site. You have fluid or blood coming from your port site. You have pus or a bad smell coming from the port site. You have a fever. Summary Implanted ports are usually placed in the chest for long-term IV access. Follow instructions from your health care provider about flushing the port and changing bandages (dressings). Take care of the area around your port by avoiding clothing that puts pressure on the area, and by watching for signs of infection. Protect the skin around your port from seat belts. Place a soft pad over your chest if needed. Get help right away if you have a fever or you have redness, swelling, pain, drainage, or a bad smell at the port site. This information is not intended to replace advice given to you by your health care provider. Make sure you discuss any questions you have with your healthcare provider. Document Revised: 01/20/2020 Document Reviewed: 01/20/2020 Elsevier Patient Education  2022 Elsevier Inc.  

## 2021-05-12 NOTE — Progress Notes (Signed)
Okay to treat with HR = 102 per Dr. Ennever. 

## 2021-05-12 NOTE — Patient Instructions (Signed)
[facility]  Discharge Instructions: Thank you for choosing [facility] to provide your oncology and hematology care.   If you have a lab appointment with the [facility], please go directly to the [facility] and check in at the registration area.  Wear comfortable clothing and clothing appropriate for easy access to any Portacath or PICC line.   We strive to give you quality time with your provider. You may need to reschedule your appointment if you arrive late (15 or more minutes).  Arriving late affects you and other patients whose appointments are after yours.  Also, if you miss three or more appointments without notifying the office, you may be dismissed from the clinic at the provider's discretion.      For prescription refill requests, have your pharmacy contact our office and allow 72 hours for refills to be completed.    Today you received the following chemotherapy and/or immunotherapy agents 5-fu, leucovorin, dilaudid, compazine   To help prevent nausea and vomiting after your treatment, we encourage you to take your nausea medication as directed.  BELOW ARE SYMPTOMS THAT SHOULD BE REPORTED IMMEDIATELY: *FEVER GREATER THAN 100.4 F (38 C) OR HIGHER *CHILLS OR SWEATING *NAUSEA AND VOMITING THAT IS NOT CONTROLLED WITH YOUR NAUSEA MEDICATION *UNUSUAL SHORTNESS OF BREATH *UNUSUAL BRUISING OR BLEEDING *URINARY PROBLEMS (pain or burning when urinating, or frequent urination) *BOWEL PROBLEMS (unusual diarrhea, constipation, pain near the anus) TENDERNESS IN MOUTH AND THROAT WITH OR WITHOUT PRESENCE OF ULCERS (sore throat, sores in mouth, or a toothache) UNUSUAL RASH, SWELLING OR PAIN  UNUSUAL VAGINAL DISCHARGE OR ITCHING   Items with * indicate a potential emergency and should be followed up as soon as possible or go to the Emergency Department if any problems should occur.  Please show the CHEMOTHERAPY ALERT CARD or IMMUNOTHERAPY ALERT  CARD at check-in to the Emergency Department and triage nurse. Should you have questions after your visit or need to cancel or reschedule your appointment, please contact [facility]  [phone] and follow the prompts.  Office hours are 8:00 a.m. to 4:30 p.m. Monday - Friday. Please note that voicemails left after 4:00 p.m. may not be returned until the following business day.  We are closed weekends and major holidays. You have access to a nurse at all times for urgent questions. Please call the main number to the clinic [phone] and follow the prompts.  For any non-urgent questions, you may also contact your provider using MyChart. We now offer e-Visits for anyone 107 and older to request care online for non-urgent symptoms. For details visit [email or web address].   Also download the MyChart app! Go to the app store, search "MyChart", open the app, select [facility], and log in with your MyChart username and password.  Due to Covid, a mask is required upon entering the hospital/clinic. If you do not have a mask, one will be given to you upon arrival. For doctor visits, patients may have 1 support person aged 9 or older with them. For treatment visits, patients cannot have anyone with them due to current Covid guidelines and our immunocompromised population.

## 2021-05-12 NOTE — Progress Notes (Signed)
Hematology and Oncology Follow Up Visit  Angela Wilson [id number] Jun 06, 1966 55 y.o. 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 is here today with her daughter.  She started the 24-hour infusion of 5-FU leucovorin.  She is managing this fairly well so far.  Unfortunately, she is having problems with pelvic pain.  This is really her biggest issue.  As such, I will have to see if we can do radiation therapy.  I think that with the 5-FU might not be a bad idea.  She is urinating quite a bit.  We are doing a urinalysis on her today.  There is no hematuria.  She does have the blood clot in the right internal jugular vein.  She is on a Lovenox but will switch over to Xarelto.  Xarelto will be a lot easier for her to take and I still think quite effective.  She is not eating as much.  There is some nausea.  She has an occasional bout of vomiting.  There is no cough or shortness of breath..  She did have the biopsy done.  This was done on 04/30/2021.  The pathology report ([id number]) showed metastatic adenocarcinoma.  We are sending this off for molecular analysis.  Maybe, we can get a break and find that she does have a targetable mutation.  Her last tumor markers showed a CA125 of 16 and a CEA of 43.  I did speak with one of the GI oncologists at [facility].  They do not have any protocols for metastatic pancreatic cancer.  I would have to say that currently, her  performance status is probably ECOG 1.  Medications:  Allergies as of 05/12/2021       Reactions   Ivp Dye [iodinated Diagnostic Agents] Itching, Rash   Morphine And Related Hives   Penicillins Rash        Medication List        Accurate as of May 12, 2021  3:19 PM. If you have any questions, ask your nurse or doctor.          albuterol 108 (90 Base) MCG/ACT inhaler Commonly known as: VENTOLIN HFA Inhale 2 puffs into the lungs every 4 (four) hours as needed for wheezing or shortness of breath.   capecitabine 500 MG tablet Commonly known as: Xeloda Take 3 tablets (1,500 mg total) by mouth 2 (two) times daily after a meal. Take for 14 days on, 7 days off. Repeat every 21 days.   cetirizine 10 MG tablet Commonly known as: ZYRTEC Take 1 tablet (10 mg total) by mouth daily. What changed:  when to take this reasons to take this   dexamethasone 4 MG tablet Commonly known as: DECADRON Take 1 tablet (4 mg total) by mouth 2 (two) times daily with a meal. For three (3) days following chemotherapy.   dicyclomine 10 MG capsule Commonly known as: Bentyl Take 1 capsule (10 mg total) by  mouth 4 (four) times daily -  before meals and at bedtime.   dronabinol 2.5 MG capsule Commonly known as: MARINOL Take 1 capsule (2.5 mg total) by mouth 2 (two) times daily before lunch and supper.   enoxaparin 100 MG/ML injection Commonly known as: LOVENOX Inject 0.95 mLs (95 mg total) into the skin every 12 (twelve) hours.   ergocalciferol 1.25 MG (50000 UT) capsule Commonly known as: VITAMIN D2 Take 1 capsule (50,000 Units total) by mouth once a week.   fentaNYL 75 MCG/HR Commonly known as: [place] 1 patch onto the skin every other day.   gabapentin 300 MG capsule Commonly known as: NEURONTIN TAKE 2 CAPSULES BY MOUTH IN THE DAYTIME AND 2 CAPSULES AT BEDTIME   glipiZIDE 10 MG tablet Commonly known as: GLUCOTROL Take 1 tablet (10 mg total) by mouth 2 (two) times daily.    Hycodan 5-1.5 MG/5ML syrup Generic drug: HYDROcodone bit-homatropine Take 5 mLs by mouth every 6 (six) hours as needed for cough.   Lactulose 20 GM/30ML Soln Take 30 mLs (20 g total) by mouth 3 (three) times daily as needed.   lidocaine-prilocaine cream Commonly known as: EMLA APPLY TO PORT 1 HOUR BEFORE ACCESS.   LORazepam 0.5 MG tablet Commonly known as: ATIVAN Take 0.5 mg every six hours as needed for nausea.   metFORMIN 500 MG 24 hr tablet Commonly known as: Glucophage XR Take 1 tablet (500 mg total) by mouth daily with breakfast.   multivitamin with minerals Tabs tablet Take 1 tablet by mouth daily.   omeprazole 40 MG capsule Commonly known as: PRILOSEC Take 40 mg by mouth daily as needed (acid reflux).   ondansetron 4 MG tablet Commonly known as: ZOFRAN Take 1 tablet (4 mg total) by mouth every 6 (six) hours as needed for nausea.   oxyCODONE 5 MG immediate release tablet Commonly known as: Oxy IR/ROXICODONE Take 1 tablet (5 mg total) by mouth every 4 (four) hours as needed for severe pain.   PHENobarbital-Belladonna Alk 16.2 MG/5ML Elix TAKE 5 MLS (16.2 MG TOTAL) BY MOUTH 4 (FOUR) TIMES DAILY.   belladonna-PHENObarbital 16.2 MG/5ML Elix Commonly known as: Donnatal Take 5 mLs (16.2 mg total) by mouth 4 (four) times daily.   polyethylene glycol 17 g packet Commonly known as: MIRALAX / GLYCOLAX Take 17 g by mouth daily as needed.   prochlorperazine 10 MG tablet Commonly known as: COMPAZINE Take 1 tablet (10 mg total) by mouth every 6 (six) hours as needed for nausea or vomiting.   vitamin B-6 250 MG tablet Take 1 tablet (250 mg total) by mouth daily.        Allergies:  Allergies  Allergen Reactions   Ivp Dye [Iodinated Diagnostic Agents] Itching and Rash   Morphine And Related Hives   Penicillins Rash    Past Medical History, Surgical history, Social history, and Family History were reviewed and updated.  Review of Systems: Review of Systems   Constitutional:  Positive for malaise/fatigue.  HENT: Negative.    Eyes: Negative.   Respiratory: Negative.    Cardiovascular: Negative.   Gastrointestinal:  Positive for abdominal pain.  Genitourinary: Negative.   Musculoskeletal:  Positive for myalgias.  Skin: Negative.   Neurological: Negative.   Endo/Heme/Allergies: Negative.   Psychiatric/Behavioral: Negative.      Physical Exam:  oral temperature is 99.3 F (37.4 C). Her blood pressure is 148/52 (abnormal) and her pulse is 102 (abnormal). Her respiration is 20 and oxygen saturation is 100%.   Wt Readings  from Last 3 Encounters:  04/30/21 202 lb (91.6 kg)  04/20/21 207 lb 0.2 oz (93.9 kg)  04/15/21 207 lb 1.9 oz (93.9 kg)   Physical Exam Vitals reviewed.  HENT:     Head: Normocephalic and atraumatic.  Eyes:     Pupils: Pupils are equal, round, and reactive to light.  Cardiovascular:     Rate and Rhythm: Normal rate and regular rhythm.     Heart sounds: Normal heart sounds.  Pulmonary:     Effort: Pulmonary effort is normal.     Breath sounds: Normal breath sounds.  Abdominal:     General: Bowel sounds are normal.     Palpations: Abdomen is soft.  Musculoskeletal:        General: No tenderness or deformity. Normal range of motion.     Cervical back: Normal range of motion.  Lymphadenopathy:     Cervical: No cervical adenopathy.  Skin:    General: Skin is warm and dry.     Findings: No erythema or rash.  Neurological:     Mental Status: She is alert and oriented to person, place, and time.  Psychiatric:        Behavior: Behavior normal.        Thought Content: Thought content normal.        Judgment: Judgment normal.     Lab Results  Component Value Date   WBC 5.3 05/12/2021   HGB 11.7 (L) 05/12/2021   HCT 36.4 05/12/2021   MCV 92.9 05/12/2021   PLT 244 05/12/2021   Lab Results  Component Value Date   FERRITIN 284 06/15/2020   IRON 55 06/15/2020   TIBC 323 06/15/2020   UIBC 268 06/15/2020    IRONPCTSAT 17 (L) 06/15/2020   Lab Results  Component Value Date   RETICCTPCT 5.7 (H) 06/15/2020   RBC 3.92 05/12/2021   No results found for: KPAFRELGTCHN, LAMBDASER, KAPLAMBRATIO No results found for: IGGSERUM, IGA, IGMSERUM No results found for: Odetta Pink, SPEI   Chemistry      Component Value Date/Time   NA 137 05/12/2021 1337   NA 136 01/07/2020 1256   K 3.9 05/12/2021 1337   CL 101 05/12/2021 1337   CO2 27 05/12/2021 1337   BUN 11 05/12/2021 1337   BUN 10 01/07/2020 1256   CREATININE 0.55 05/12/2021 1337      Component Value Date/Time   CALCIUM 9.5 05/12/2021 1337   ALKPHOS 47 05/12/2021 1337   AST 13 (L) 05/12/2021 1337   ALT 8 05/12/2021 1337   BILITOT 0.5 05/12/2021 1337       Impression and Plan: [name] is a very pleasant 55 yo [name] female with metastatic pancreatic cancer.   Unfortunately, we are now seeing her pancreatic cancer that is being a bit more resilient to therapy.  Hopefully, she will respond to the weekly infusion of 5-FU.  I spoke with Dr. Sondra Come of Radiation Oncology.  He will try to get her in quickly to see about radiation therapy.  Hopefully the combination of radiation and 5 -FU will help with her pain.  I want her quality of life to be better.  She will be on Xarelto.  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  It will probably take another couple weeks before we get the molecular markers back.  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  This  has certainly been a problem.  We will plan to get her back to see [place] in another 2-3 weeks.  She will continue to come back weekly for her infusion.     Volanda Napoleon, MD 8/24/20223:19 PM

## 2021-05-13 ENCOUNTER — Telehealth: Payer: Self-pay

## 2021-05-13 ENCOUNTER — Encounter: Payer: Self-pay | Admitting: Hematology & Oncology

## 2021-05-13 ENCOUNTER — Inpatient Hospital Stay: Payer: 59

## 2021-05-13 ENCOUNTER — Other Ambulatory Visit: Payer: Self-pay | Admitting: *Deleted

## 2021-05-13 VITALS — BP 143/82 | HR 103 | Temp 98.9°F | Resp 20

## 2021-05-13 DIAGNOSIS — C252 Malignant neoplasm of tail of pancreas: Secondary | ICD-10-CM

## 2021-05-13 DIAGNOSIS — C253 Malignant neoplasm of pancreatic duct: Secondary | ICD-10-CM

## 2021-05-13 DIAGNOSIS — Z5111 Encounter for antineoplastic chemotherapy: Secondary | ICD-10-CM | POA: Diagnosis not present

## 2021-05-13 DIAGNOSIS — Z95828 Presence of other vascular implants and grafts: Secondary | ICD-10-CM

## 2021-05-13 LAB — CA 125: Cancer Antigen (CA) 125: 812 U/mL — ABNORMAL HIGH (ref 0.0–38.1)

## 2021-05-13 LAB — CEA (IN HOUSE-CHCC): CEA (CHCC-In House): 55.41 ng/mL — ABNORMAL HIGH (ref 0.00–5.00)

## 2021-05-13 MED ORDER — SODIUM CHLORIDE 0.9% FLUSH
10.0000 mL | Freq: Once | INTRAVENOUS | Status: AC
  Administered 2021-05-13: 10 mL via INTRAVENOUS

## 2021-05-13 MED ORDER — HEPARIN SOD (PORK) LOCK FLUSH 100 UNIT/ML IV SOLN
500.0000 [IU] | Freq: Once | INTRAVENOUS | Status: AC
  Administered 2021-05-13: 500 [IU] via INTRAVENOUS

## 2021-05-13 NOTE — Progress Notes (Signed)
Patient needs referral to RadOnc. Dr Marin Olp spoke to Dr Sondra Come. Order placed.   Oncology Nurse Navigator Documentation  Oncology Nurse Navigator Flowsheets 05/12/2021  Confirmed Diagnosis Date -  Diagnosis Status -  Planned Course of Treatment -  Phase of Treatment -  Chemotherapy Pending- Reason: -  Chemotherapy Actual Start Date: -  Navigator Follow Up Date: 06/02/2021  Navigator Follow Up Reason: Follow-up Appointment;Chemotherapy  Navigator Location [facility]  Referral Date to RadOnc/MedOnc -  Navigator Encounter Type Treatment;Appt/Treatment Plan Review  Telephone -  Treatment Initiated Date -  Patient Visit Type MedOnc  Treatment Phase Active Tx  Barriers/Navigation Needs Coordination of Care;Cultural Needs;Family Concerns;Language/Communication;Education  Education -  Interventions Psycho-Social Support;Referrals  Acuity Level 2-Minimal Needs (1-2 Barriers Identified)  Referrals Radiation Oncology  Coordination of Care -  Education Method -  Support Groups/Services Friends and Family  Time Spent with Patient 60

## 2021-05-13 NOTE — Telephone Encounter (Signed)
Appts made per 05/12/21 los, pt to gain ujpdated sch at 8/25 appt and through [employer]

## 2021-05-13 NOTE — Patient Instructions (Signed)
Implanted Port Home Guide An implanted port is a device that is placed under the skin. It is usually placed in the chest. The device can be used to give IV medicine, to take blood, or for dialysis. You may have an implanted port if: You need IV medicine that would be irritating to the small veins in your hands or arms. You need IV medicines, such as antibiotics, for a long period of time. You need IV nutrition for a long period of time. You need dialysis. When you have a port, your health care provider can choose to use the port instead of veins in your arms for these procedures. You may have fewer limitations when using a port than you would if you used other types of long-term IVs, and you will likely be able to return to normal activities afteryour incision heals. An implanted port has two main parts: Reservoir. The reservoir is the part where a needle is inserted to give medicines or draw blood. The reservoir is round. After it is placed, it appears as a small, raised area under your skin. Catheter. The catheter is a thin, flexible tube that connects the reservoir to a vein. Medicine that is inserted into the reservoir goes into the catheter and then into the vein. How is my port accessed? To access your port: A numbing cream may be placed on the skin over the port site. Your health care provider will put on a mask and sterile gloves. The skin over your port will be cleaned carefully with a germ-killing soap and allowed to dry. Your health care provider will gently pinch the port and insert a needle into it. Your health care provider will check for a blood return to make sure the port is in the vein and is not clogged. If your port needs to remain accessed to get medicine continuously (constant infusion), your health care provider will place a clear bandage (dressing) over the needle site. The dressing and needle will need to be changed every week, or as told by your health care provider. What  is flushing? Flushing helps keep the port from getting clogged. Follow instructions from your health care provider about how and when to flush the port. Ports are usually flushed with saline solution or a medicine called heparin. The need for flushing will depend on how the port is used: If the port is only used from time to time to give medicines or draw blood, the port may need to be flushed: Before and after medicines have been given. Before and after blood has been drawn. As part of routine maintenance. Flushing may be recommended every 4-6 weeks. If a constant infusion is running, the port may not need to be flushed. Throw away any syringes in a disposal container that is meant for sharp items (sharps container). You can buy a sharps container from a pharmacy, or you can make one by using an empty hard plastic bottle with a cover. How long will my port stay implanted? The port can stay in for as long as your health care provider thinks it is needed. When it is time for the port to come out, a surgery will be done to remove it. The surgery will be similar to the procedure that was done to putthe port in. Follow these instructions at home:  Flush your port as told by your health care provider. If you need an infusion over several days, follow instructions from your health care provider about how to take   care of your port site. Make sure you: Wash your hands with soap and water before you change your dressing. If soap and water are not available, use alcohol-based hand sanitizer. Change your dressing as told by your health care provider. Place any used dressings or infusion bags into a plastic bag. Throw that bag in the trash. Keep the dressing that covers the needle clean and dry. Do not get it wet. Do not use scissors or sharp objects near the tube. Keep the tube clamped, unless it is being used. Check your port site every day for signs of infection. Check for: Redness, swelling, or  pain. Fluid or blood. Pus or a bad smell. Protect the skin around the port site. Avoid wearing bra straps that rub or irritate the site. Protect the skin around your port from seat belts. Place a soft pad over your chest if needed. Bathe or shower as told by your health care provider. The site may get wet as long as you are not actively receiving an infusion. Return to your normal activities as told by your health care provider. Ask your health care provider what activities are safe for you. Carry a medical alert card or wear a medical alert bracelet at all times. This will let health care providers know that you have an implanted port in case of an emergency. Get help right away if: You have redness, swelling, or pain at the port site. You have fluid or blood coming from your port site. You have pus or a bad smell coming from the port site. You have a fever. Summary Implanted ports are usually placed in the chest for long-term IV access. Follow instructions from your health care provider about flushing the port and changing bandages (dressings). Take care of the area around your port by avoiding clothing that puts pressure on the area, and by watching for signs of infection. Protect the skin around your port from seat belts. Place a soft pad over your chest if needed. Get help right away if you have a fever or you have redness, swelling, pain, drainage, or a bad smell at the port site. This information is not intended to replace advice given to you by your health care provider. Make sure you discuss any questions you have with your healthcare provider. Document Revised: 01/20/2020 Document Reviewed: 01/20/2020 Elsevier Patient Education  2022 Elsevier Inc.  

## 2021-05-15 LAB — URINE CULTURE: Culture: <10000 — AB

## 2021-05-17 ENCOUNTER — Other Ambulatory Visit: Payer: Self-pay

## 2021-05-17 DIAGNOSIS — C252 Malignant neoplasm of tail of pancreas: Secondary | ICD-10-CM

## 2021-05-17 NOTE — Progress Notes (Signed)
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  Angela Wilson presented 12/28/2019 with symptoms of: abdominal pain  Biopsies   01/27/2020   02/14/2020   Past/Anticipated interventions by surgeon, if any:  it is clearly progressed outside the confines of surgery  Past/Anticipated interventions by medical oncology, if any: Dr Marin Olp Past Therapy: Gemzar/Abraxane -s/p cycle # 7-  start on 02/25/2020 - d/c on 11/10/2020    Current Therapy:        FOLFIRINOX - s/p cycle 4 - start on 11/25/2020 -- d/c on 02/26/2021 Gemzar/Xeloda -- s/p cycle #2 -- start on 03/04/2021  -- d/c on 04/22/2021 for progression 5-FU/Leukovorin - 24 hr infusion - q week -- start on 04/27/2021 Xarelto 20 mg po q day  Weight changes, if any: yes, 25 pound weight loss since diagnosed  Bowel/Bladder complaints, if any: yes, abdominal pain with urination, occasional urgency, nocturia x2-3  Nausea / Vomiting, if any: no  Pain issues, if any:  yes, right abdominal pain  Any blood per rectum:   no  SAFETY ISSUES: Prior radiation? no Pacemaker/ICD? no Possible current pregnancy? no, postmenopausal Is the patient on methotrexate? no  Current Complaints/Details: none  Vitals:   05/20/21 1236  BP: 123/80  Pulse: (!) 114  Resp: 20  Temp: (!) 97.2 F (36.2 C)  TempSrc: Temporal  SpO2: 99%  Weight: 208 lb 4 oz (94.5 kg)  Height: '5\' 6"'$  (1.676 m)

## 2021-05-18 ENCOUNTER — Inpatient Hospital Stay: Payer: 59

## 2021-05-18 ENCOUNTER — Ambulatory Visit: Payer: 59

## 2021-05-18 ENCOUNTER — Other Ambulatory Visit: Payer: Self-pay

## 2021-05-18 ENCOUNTER — Other Ambulatory Visit: Payer: 59

## 2021-05-18 VITALS — BP 124/65 | HR 99 | Temp 98.6°F | Resp 20

## 2021-05-18 DIAGNOSIS — Z5111 Encounter for antineoplastic chemotherapy: Secondary | ICD-10-CM | POA: Diagnosis not present

## 2021-05-18 DIAGNOSIS — C252 Malignant neoplasm of tail of pancreas: Secondary | ICD-10-CM

## 2021-05-18 DIAGNOSIS — K8689 Other specified diseases of pancreas: Secondary | ICD-10-CM

## 2021-05-18 DIAGNOSIS — R1084 Generalized abdominal pain: Secondary | ICD-10-CM

## 2021-05-18 LAB — CBC WITH DIFFERENTIAL (CANCER CENTER ONLY)
Abs Immature Granulocytes: 0.05 10*3/uL (ref 0.00–0.07)
Basophils Absolute: 0 10*3/uL (ref 0.0–0.1)
Basophils Relative: 1 %
Eosinophils Absolute: 0.3 10*3/uL (ref 0.0–0.5)
Eosinophils Relative: 6 %
HCT: 36.1 % (ref 36.0–46.0)
Hemoglobin: 11.6 g/dL — ABNORMAL LOW (ref 12.0–15.0)
Immature Granulocytes: 1 %
Lymphocytes Relative: 28 %
Lymphs Abs: 1.6 10*3/uL (ref 0.7–4.0)
MCH: 30 pg (ref 26.0–34.0)
MCHC: 32.1 g/dL (ref 30.0–36.0)
MCV: 93.3 fL (ref 80.0–100.0)
Monocytes Absolute: 0.5 10*3/uL (ref 0.1–1.0)
Monocytes Relative: 8 %
Neutro Abs: 3.3 10*3/uL (ref 1.7–7.7)
Neutrophils Relative %: 56 %
Platelet Count: 211 10*3/uL (ref 150–400)
RBC: 3.87 MIL/uL (ref 3.87–5.11)
RDW: 16.6 % — ABNORMAL HIGH (ref 11.5–15.5)
WBC Count: 5.8 10*3/uL (ref 4.0–10.5)
nRBC: 0 % (ref 0.0–0.2)

## 2021-05-18 LAB — CMP (CANCER CENTER ONLY)
ALT: 9 U/L (ref 0–44)
AST: 12 U/L — ABNORMAL LOW (ref 15–41)
Albumin: 3.5 g/dL (ref 3.5–5.0)
Alkaline Phosphatase: 39 U/L (ref 38–126)
Anion gap: 6 (ref 5–15)
BUN: 12 mg/dL (ref 6–20)
CO2: 30 mmol/L (ref 22–32)
Calcium: 9.1 mg/dL (ref 8.9–10.3)
Chloride: 102 mmol/L (ref 98–111)
Creatinine: 0.53 mg/dL (ref 0.44–1.00)
GFR, Estimated: >60 mL/min (ref >60)
Glucose, Bld: 102 mg/dL — ABNORMAL HIGH (ref 70–99)
Potassium: 3.9 mmol/L (ref 3.5–5.1)
Sodium: 138 mmol/L (ref 135–145)
Total Bilirubin: 0.6 mg/dL (ref 0.3–1.2)
Total Protein: 6.8 g/dL (ref 6.5–8.1)

## 2021-05-18 MED ORDER — LEUCOVORIN CALCIUM INJECTION 350 MG
200.0000 mg/m2 | Freq: Once | INTRAVENOUS | Status: AC
  Administered 2021-05-18: 412 mg via INTRAVENOUS
  Filled 2021-05-18: qty 20.6

## 2021-05-18 MED ORDER — PROCHLORPERAZINE MALEATE 10 MG PO TABS
10.0000 mg | ORAL_TABLET | Freq: Once | ORAL | Status: AC
  Administered 2021-05-18: 10 mg via ORAL
  Filled 2021-05-18: qty 1

## 2021-05-18 MED ORDER — SODIUM CHLORIDE 0.9 % IV SOLN
1200.0000 mg/m2 | INTRAVENOUS | Status: DC
  Administered 2021-05-18: 2450 mg via INTRAVENOUS
  Filled 2021-05-18: qty 49

## 2021-05-18 MED ORDER — HYDROMORPHONE HCL 4 MG/ML IJ SOLN
4.0000 mg | Freq: Once | INTRAMUSCULAR | Status: AC
  Administered 2021-05-18: 4 mg via INTRAVENOUS
  Filled 2021-05-18: qty 1

## 2021-05-18 MED ORDER — SODIUM CHLORIDE 0.9 % IV SOLN: Freq: Once | INTRAVENOUS | Status: AC

## 2021-05-18 NOTE — Patient Instructions (Signed)
[facility]  Discharge Instructions: Thank you for choosing [facility] to provide your oncology and hematology care.   If you have a lab appointment with the [facility], please go directly to the [facility] and check in at the registration area.  Wear comfortable clothing and clothing appropriate for easy access to any Portacath or PICC line.   We strive to give you quality time with your provider. You may need to reschedule your appointment if you arrive late (15 or more minutes).  Arriving late affects you and other patients whose appointments are after yours.  Also, if you miss three or more appointments without notifying the office, you may be dismissed from the clinic at the provider's discretion.      For prescription refill requests, have your pharmacy contact our office and allow 72 hours for refills to be completed.    Today you received the following chemotherapy and/or immunotherapy agents leukovorin, 5FU      To help prevent nausea and vomiting after your treatment, we encourage you to take your nausea medication as directed.  BELOW ARE SYMPTOMS THAT SHOULD BE REPORTED IMMEDIATELY: *FEVER GREATER THAN 100.4 F (38 C) OR HIGHER *CHILLS OR SWEATING *NAUSEA AND VOMITING THAT IS NOT CONTROLLED WITH YOUR NAUSEA MEDICATION *UNUSUAL SHORTNESS OF BREATH *UNUSUAL BRUISING OR BLEEDING *URINARY PROBLEMS (pain or burning when urinating, or frequent urination) *BOWEL PROBLEMS (unusual diarrhea, constipation, pain near the anus) TENDERNESS IN MOUTH AND THROAT WITH OR WITHOUT PRESENCE OF ULCERS (sore throat, sores in mouth, or a toothache) UNUSUAL RASH, SWELLING OR PAIN  UNUSUAL VAGINAL DISCHARGE OR ITCHING   Items with * indicate a potential emergency and should be followed up as soon as possible or go to the Emergency Department if any problems should occur.  Please show the CHEMOTHERAPY ALERT CARD or IMMUNOTHERAPY ALERT CARD at check-in to  the Emergency Department and triage nurse. Should you have questions after your visit or need to cancel or reschedule your appointment, please contact [facility]  [phone] and follow the prompts.  Office hours are 8:00 a.m. to 4:30 p.m. Monday - Friday. Please note that voicemails left after 4:00 p.m. may not be returned until the following business day.  We are closed weekends and major holidays. You have access to a nurse at all times for urgent questions. Please call the main number to the clinic [phone] and follow the prompts.  For any non-urgent questions, you may also contact your provider using MyChart. We now offer e-Visits for anyone 53 and older to request care online for non-urgent symptoms. For details visit [email or web address].   Also download the MyChart app! Go to the app store, search "MyChart", open the app, select [facility], and log in with your MyChart username and password.  Due to Covid, a mask is required upon entering the hospital/clinic. If you do not have a mask, one will be given to you upon arrival. For doctor visits, patients may have 1 support person aged 53 or older with them. For treatment visits, patients cannot have anyone with them due to current Covid guidelines and our immunocompromised population.

## 2021-05-18 NOTE — Patient Instructions (Signed)

## 2021-05-19 ENCOUNTER — Inpatient Hospital Stay: Payer: 59

## 2021-05-19 VITALS — BP 119/66 | HR 90 | Temp 98.7°F | Resp 19

## 2021-05-19 DIAGNOSIS — Z5111 Encounter for antineoplastic chemotherapy: Secondary | ICD-10-CM | POA: Diagnosis not present

## 2021-05-19 DIAGNOSIS — Z95828 Presence of other vascular implants and grafts: Secondary | ICD-10-CM

## 2021-05-19 MED ORDER — HEPARIN SOD (PORK) LOCK FLUSH 100 UNIT/ML IV SOLN
500.0000 [IU] | Freq: Once | INTRAVENOUS | Status: AC
  Administered 2021-05-19: 500 [IU] via INTRAVENOUS

## 2021-05-19 MED ORDER — SODIUM CHLORIDE 0.9% FLUSH
10.0000 mL | Freq: Once | INTRAVENOUS | Status: AC
  Administered 2021-05-19: 10 mL via INTRAVENOUS

## 2021-05-19 NOTE — Progress Notes (Signed)
Radiation Oncology         [phone] ________________________________  Initial Outpatient Consultation  Name: Angela Wilson MRN: [id number]  Date: 05/20/2021  DOB: 09/26/1965  LO:VFIE, Laurita Quint, FNP (Inactive)  Marin Olp, Rudell Cobb, MD   REFERRING PHYSICIAN: Volanda Napoleon, MD  DIAGNOSIS: The primary encounter diagnosis was Malignant neoplasm of pancreatic duct [facility]). A diagnosis of Malignant neoplasm of tail of pancreas ([place]) was also pertinent to this visit.  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 female who is accompanied by her son-in-law. she is seen as a courtesy of Dr. Marin Olp for an opinion concerning radiation therapy as part of management for her  diagnosed pancreatic cancer. The patient initially presented to [facility] ED on 12/28/19 with complaints of diffuse abdominal pain which (per the patient) felt like gas build up in her abdomen, accompanied by a decrease in appetite. On the day prior to her ED admission, the patient was seen at an Urgent Care which referred her to the ED due to abnormal labs. CT of the abdomen and pelvis taken during ED course on 04/10 demonstrated a heterogeneous mass within the tail of the pancreas noted as worrisome for the presence of an underlying neoplasm. Mild to moderate severity right basilar atelectasis and/or infiltrate was noted as well, with a small to moderate sized right pleural effusion.  The patient was accordingly admitted for further observation and work-up on 12/29/20. During her hospital stay, the patient reported (per her daughter translating for her) to have a loss of appetite while admitted; with PO around 25-50% per meal. MRI performed on 12/31/19 demonstrated the pancreatic mass head to measure approximately 2.5 cm in size. Patient was discharged on 01/02/20 for outpatient care.   Accordingly, the patient underwent upper endoscopy with  EUS on 01/27/20. Pathology from the procedure showed pancreatic adenocarcinoma.  Subsequently, the patient was referred to Dr. Marin Olp on 02/03/20 for consultation. Dr. Marin Olp noted at the time that the patient will likely benefit from surgical intervention over radiation or chemo (the patient stated that will not take any chemotherapy). At this time, Dr. Marin Olp referred the patient to Dr. Barry Dienes for consideration of partial pancreatectomy. However, the patient soon after presented to the ED on 02/13/20 due to progression of her abdominal pain. CT of the abdomen and pelvis performed during ED course further revealed the pancreatic tail mass to measure 5.5 x 3.9 cm, as well as extensive omental and peritoneal carcinomatosis in the abdomen and upper pelvis. The patient was accordingly admitted for further evaluation. The patient underwent biopsy of the peritoneal mass on 02/14/20; pathology from the procedure again revealed adenocarcinoma. Patient additionally received chemo port placement during hospital course. (Discharged on 02/17/21).  Accordingly, the patient began treatment with Gemzar/Abraxane under the care of Dr. Marin Olp on 02/25/20. The patient maintained close follow-up with Dr. Marin Olp over the course of her treatment. CT scan performed on 04/21/20 revealed a good overall response to treatment showing the mass to be stable measuring 4.2 x 3.3 cm. CT of the abdomen and pelvis on 07/01/20 again demonstrated treatment response; revealing the mass to measure approximately 3.6 x 2.4 cm. The patient continued to have abdominal pain in the following months (she stopped her treatment for several months to go visit her granddaughter in [place]). CT of the abdomen and pelvis taken on 11/16/20 unfortunately showed evidence of metastatic pancreatic cancer progression in the abdomen; with a new  evidence of a hypodense lesion within the left hepatic lobe, as well as a dramatic increase in volume of the right  adnexal cystic and solid complex and left adnexal mass. Findings were noted to be most consistent with adnexal metastasis and rapid growth. Additionally seen was mild interval increase in carcinomatosis and ventral peritoneal metastasis. Ultrasound performed on 12/08/20 demonstrated a small to moderate amount of abdominopelvic ascites, and the hypoechoic solid lesion within the left liver lobe measuring 2.8 cm.   CT of the chest abdomen and pelvis on 01/27/21 unfortunately demonstrated continued progressive enlargement on complex bilateral adnexal masses; consistent with progressive metastases, as well as slight progression of peritoneal metastases, including a subcapsular implant in the dome of the left hepatic lobe. No evidence of thoracic metastatic disease was visualized. She continued with chemotherapy treatment under the care of Dr. Marin Olp (detailed below). On 04/20/21, the patient presented to the ED with complaints of right sided neck pain. Thyroid US performed that day during follow-up with Dr. Marin Olp indicated that she likely had jugular thrombus (accordingly srated on Lovenox). Patient indicated that the right sided neck pain began when she had her chemo port recently reassessed. CT of the abdomen and pelvis taken during ED course unfortunately demonstrated evident disease progression to her pelvis and bilateral ovarian implants; development of moderate right pleural effusion was noted as well. She soon after followed up with Dr. Marin Olp on 04/22/21 to review these recent findings. Dr. Marin Olp discussed possible consideration of radiation treatment at this time and discussed further treatment with leucovorin (Xeloda with Gemzar were discontinued at this time).   Accordingly, Dr. Marin Olp recommended for an additional tissue biopsy of the anterior peritoneum. Pathology from the procedure on 04/30/21 revealed: metastatic adenocarcinoma. The patient most recently followed up with Dr. Marin Olp on  05/12/21, during which time, the patient was informed that her cancer has likely become more resistant to therapy. Patient stated that she has increased pelvic pain. I spoke with Dr. Marin Olp at this time and agreed to see her as quickly as possible for consideration of radiation therapy.   Of note: the patient is [name] and primarily speaks Arabic. She has required the help of her daughter for interpretation (Dr. Marin Olp has stated that her daughter is very good at interpreting). The patient primarily lives in [place], her oldest daughter lives in [place] whom she comes to visit. She has another daughter who is is doctor in [place].  Past Systemic Therapy: --Gemzar/Abraxane -s/p cycle # 7-  start on 02/25/2020 - d/c on 11/10/2020  --FOLFIRINOX - s/p cycle 4 - start on 11/25/2020 -- d/c on 02/26/2021 --Gemzar/Xeloda -- s/p cycle #2 -- start on 03/04/2021  -- d/c on 04/22/2021 for progression Current Systemic Therapy:        --5-FU/Leukovorin - 24 hr infusion - q week -- start on 04/27/2021 Xarelto 20 mg po q day  The patient is having increasing pain in the pelvis region related to her Krukenberg tumors.  She is now referred to radiation oncology for consideration for short course of palliative radiation treatment.  PREVIOUS RADIATION THERAPY: No  PAST MEDICAL HISTORY:  Past Medical History:  Diagnosis Date   Diabetes mellitus without complication ([place])    Family history of brain cancer    Family history of breast cancer    Family history of kidney cancer    Family history of ovarian cancer    GERD (gastroesophageal reflux disease)    Hyperlipidemia    Hypertension    Kidney  stone    Pancreatic cancer ([place]) 02/03/2020   Shingles    Type 2 diabetes mellitus ([place])     PAST SURGICAL HISTORY: Past Surgical History:  Procedure Laterality Date   CESAREAN SECTION     x 4   IR IMAGING GUIDED PORT INSERTION  02/18/2020   KIDNEY STONE SURGERY      FAMILY HISTORY:  Family History   Problem Relation Age of Onset   Heart attack Maternal Uncle    Breast cancer Paternal Aunt        dx. in her 21s   Brain cancer Paternal Uncle 21   Kidney cancer Cousin        dx. <50   Ovarian cancer Cousin    Breast cancer Cousin        dx. "young"   Ovarian cancer Cousin    Cancer Cousin        Cancer on the back, dx. in her 28s   Lung cancer Cousin     SOCIAL HISTORY:  Social History   Tobacco Use   Smoking status: Never   Smokeless tobacco: Never  Vaping Use   Vaping Use: Never used  Substance Use Topics   Alcohol use: No   Drug use: Never    ALLERGIES:  Allergies  Allergen Reactions   Ivp Dye [Iodinated Diagnostic Agents] Itching and Rash   Morphine And Related Hives   Penicillins Rash    MEDICATIONS:  Current Outpatient Medications  Medication Sig Dispense Refill   cetirizine (ZYRTEC) 10 MG tablet Take 1 tablet (10 mg total) by mouth daily. (Patient taking differently: Take 10 mg by mouth daily as needed for allergies.) 30 tablet 3   dexamethasone (DECADRON) 4 MG tablet Take 1 tablet (4 mg total) by mouth 2 (two) times daily with a meal. For three (3) days following chemotherapy. 24 tablet 0   dicyclomine (BENTYL) 10 MG capsule Take 1 capsule (10 mg total) by mouth 4 (four) times daily -  before meals and at bedtime. 120 capsule 2   fentaNYL (DURAGESIC) 75 MCG/HR Place 1 patch onto the skin every other day. 15 patch 0   glipiZIDE (GLUCOTROL) 10 MG tablet Take 1 tablet (10 mg total) by mouth 2 (two) times daily. 60 tablet 1   HYDROcodone bit-homatropine (HYCODAN) 5-1.5 MG/5ML syrup Take 5 mLs by mouth every 6 (six) hours as needed for cough. 250 mL 0   lidocaine-prilocaine (EMLA) cream APPLY TO PORT 1 HOUR BEFORE ACCESS. 30 g 3   LORazepam (ATIVAN) 0.5 MG tablet Take 0.5 mg every six hours as needed for nausea. 30 tablet 0   metFORMIN (GLUCOPHAGE XR) 500 MG 24 hr tablet Take 1 tablet (500 mg total) by mouth daily with breakfast. 90 tablet 1   omeprazole  (PRILOSEC) 40 MG capsule Take 40 mg by mouth daily as needed (acid reflux).     ondansetron (ZOFRAN) 4 MG tablet Take 1 tablet (4 mg total) by mouth every 6 (six) hours as needed for nausea. 20 tablet 0   oxyCODONE (OXY IR/ROXICODONE) 5 MG immediate release tablet Take 1 tablet (5 mg total) by mouth every 4 (four) hours as needed for severe pain. 90 tablet 0   polyethylene glycol (MIRALAX / GLYCOLAX) 17 g packet Take 17 g by mouth daily as needed. 14 each 0   prochlorperazine (COMPAZINE) 10 MG tablet Take 1 tablet (10 mg total) by mouth every 6 (six) hours as needed for nausea or vomiting. 90 tablet 3   Pyridoxine  HCl (VITAMIN B-6) 250 MG tablet Take 1 tablet (250 mg total) by mouth daily. 30 tablet 6   rivaroxaban (XARELTO) 20 MG TABS tablet Take 1 tablet (20 mg total) by mouth daily with supper. 30 tablet 6   albuterol (PROVENTIL HFA;VENTOLIN HFA) 108 (90 Base) MCG/ACT inhaler Inhale 2 puffs into the lungs every 4 (four) hours as needed for wheezing or shortness of breath. (Patient not taking: Reported on 05/20/2021) 1 Inhaler 0   belladonna-PHENObarbital (DONNATAL) 16.2 MG/5ML ELIX Take 5 mLs (16.2 mg total) by mouth 4 (four) times daily. (Patient not taking: No sig reported) 600 mL 2   capecitabine (XELODA) 500 MG tablet Take 3 tablets (1,500 mg total) by mouth 2 (two) times daily after a meal. Take for 14 days on, 7 days off. Repeat every 21 days. (Patient not taking: No sig reported) 84 tablet 6   dronabinol (MARINOL) 2.5 MG capsule Take 1 capsule (2.5 mg total) by mouth 2 (two) times daily before lunch and supper. (Patient not taking: Reported on 05/20/2021) 60 capsule 0   ergocalciferol (VITAMIN D2) 1.25 MG (50000 UT) capsule Take 1 capsule (50,000 Units total) by mouth once a week. (Patient not taking: No sig reported) 6 capsule 3   gabapentin (NEURONTIN) 300 MG capsule TAKE 2 CAPSULES BY MOUTH IN THE DAYTIME AND 2 CAPSULES AT BEDTIME (Patient not taking: No sig reported) 120 capsule 3   Lactulose  20 GM/30ML SOLN Take 30 mLs (20 g total) by mouth 3 (three) times daily as needed. (Patient not taking: Reported on 05/20/2021) 450 mL 3   Multiple Vitamin (MULTIVITAMIN WITH MINERALS) TABS tablet Take 1 tablet by mouth daily. (Patient not taking: Reported on 05/20/2021)     PB-Hyoscy-Atropine-Scopolamine (PHENOBARBITAL-BELLADONNA ALK) 16.2 MG/5ML ELIX TAKE 5 MLS (16.2 MG TOTAL) BY MOUTH 4 (FOUR) TIMES DAILY. (Patient not taking: No sig reported) 600 mL 2   No current facility-administered medications for this encounter.    REVIEW OF SYSTEMS:  A 10+ POINT REVIEW OF SYSTEMS WAS OBTAINED including neurology, dermatology, psychiatry, cardiac, respiratory, lymph, extremities, GI, GU, musculoskeletal, constitutional, reproductive, HEENT.  Reports pain in the right lower quadrant area.  She has nausea associated with her chemotherapy.  The son-in-law reports that she has increasing pain with urination and bowel movements.   PHYSICAL EXAM:  height is 5' 6"  (1.676 m) and weight is 208 lb 4 oz (94.5 kg). Her temporal temperature is 97.2 F (36.2 C) (abnormal). Her blood pressure is 123/80 and her pulse is 114 (abnormal). Her respiration is 20 and oxygen saturation is 99%.   General: Alert and oriented, in no acute distress, evaluation is made with the assistance of the patient's son-in-law who provided Arabic interpretation. HEENT: Head is normocephalic. Extraocular movements are intact.  Neck: Neck is supple, no palpable cervical or supraclavicular lymphadenopathy. Heart: Regular in rate and rhythm with no murmurs, rubs, or gallops. Chest: Clear to auscultation bilaterally, with no rhonchi, wheezes, or rales. Abdomen: Soft, nontender,distended, with no rigidity or guarding.  She points to pain in the right lower quadrant area. Extremities: No cyanosis or edema. Lymphatics: see Neck Exam Skin: No concerning lesions. Musculoskeletal: symmetric strength and muscle tone throughout. Neurologic: Cranial nerves II  through XII are grossly intact. No obvious focalities. Speech is fluent. Coordination is intact. Psychiatric: Judgment and insight are intact. Affect is appropriate.   ECOG = 1  0 - Asymptomatic (Fully active, able to carry on all predisease activities without restriction)  1 - Symptomatic but completely ambulatory (  Restricted in physically strenuous activity but ambulatory and able to carry out work of a light or sedentary nature. For example, light housework, office work)  2 - Symptomatic, <50% in bed during the day (Ambulatory and capable of all self care but unable to carry out any work activities. Up and about more than 50% of waking hours)  3 - Symptomatic, >50% in bed, but not bedbound (Capable of only limited self-care, confined to bed or chair 50% or more of waking hours)  4 - Bedbound (Completely disabled. Cannot carry on any self-care. Totally confined to bed or chair)  5 - Death   Eustace Pen MM, Creech RH, Tormey DC, et al. [phone]). "Toxicity and response criteria of the [facility]". [place] Oncol. 5 (6): 649-55  LABORATORY DATA:  Lab Results  Component Value Date   WBC 5.8 05/18/2021   HGB 11.6 (L) 05/18/2021   HCT 36.1 05/18/2021   MCV 93.3 05/18/2021   PLT 211 05/18/2021   NEUTROABS 3.3 05/18/2021   Lab Results  Component Value Date   NA 138 05/18/2021   K 3.9 05/18/2021   CL 102 05/18/2021   CO2 30 05/18/2021   GLUCOSE 102 (H) 05/18/2021   CREATININE 0.53 05/18/2021   CALCIUM 9.1 05/18/2021      RADIOGRAPHY: CT Angio Chest PE W and/or Wo Contrast  Result Date: 04/21/2021 CLINICAL DATA:  PE suspected, high prob; Abdominal pain, acute, nonlocalized PE suspected and acute abdominal pain. Hx diabetes, GERD, HTN, kidney stone, and pancreatic cancer. EXAM: CT ANGIOGRAPHY CHEST CT ABDOMEN AND PELVIS WITH CONTRAST TECHNIQUE: Multidetector CT imaging of the chest was performed using the standard protocol during bolus administration of  intravenous contrast. Multiplanar CT image reconstructions and MIPs were obtained to evaluate the vascular anatomy. Multidetector CT imaging of the abdomen and pelvis was performed using the standard protocol during bolus administration of intravenous contrast. Due to multiple technical difficulties with access, bolus injection was stopped twice resulting in suboptimal opacification of the pulmonary arterial tree. CONTRAST:  143m OMNIPAQUE IOHEXOL 350 MG/ML SOLN COMPARISON:  01/27/2021 FINDINGS: CTA CHEST FINDINGS Cardiovascular: There is inadequate opacification of the pulmonary arterial tree to evaluate for the presence of a intraluminal filling defect the pulmonary arterial tree is of normal caliber. Global cardiac size within normal limits. No pericardial effusion. The thoracic aorta is unremarkable. Bovine arch anatomy with wide patency of the arch vasculature proximally. Right internal jugular chest port tip is seen within the right atrium. Mediastinum/Nodes: No pathologic thoracic adenopathy. Esophagus unremarkable. Lungs/Pleura: Moderate right pleural effusion is present, new since prior examination with compressive atelectasis of the right lung. Lungs are otherwise clear. No pneumothorax. No pleural effusion on the left. Central airways are widely patent. Musculoskeletal: No lytic or blastic bone lesions. No acute bone abnormality. Review of the MIP images confirms the above findings. 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 No intra or extrahepatic biliary ductal dilation. Gallbladder absent. Pancreas: There is again noted mild fullness of the tail of the pancreas, however, a discrete mass is not clearly identified. No pancreatic  ductal dilation. No peripancreatic inflammatory changes identified. Spleen: Unremarkable Adrenals/Urinary Tract: The adrenal glands are unremarkable. The kidneys are normal in size and position. 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  There has developed mild distension of the renal pelves without associated caliectasis which may simply relate to aggressive hydration. The kidneys are otherwise unremarkable. The bladder is unremarkable. Stomach/Bowel: Serosal implants involving the transverse and sigmoid mesocolon are again identified. No evidence of obstruction. The stomach, small bowel, and large bowel are otherwise unremarkable. The appendix is not clearly identified, however, there are no secondary signs of appendicitis within the right lower quadrant. Trace ascites appears slightly increased since prior examination. Multiple peritoneal implants are again identified, most prominently within the periumbilical region and appear grossly stable since prior examination. No free intraperitoneal gas. Vascular/Lymphatic: No pathologic adenopathy within the abdomen and pelvis. The abdominal vasculature is unremarkable. Reproductive: The uterus is unremarkable. 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 Cystic collection within the right adnexa, by example, has significantly increased in overall dimension since prior examination, measuring 7.5 x 10.6 x 10.5 cm in greatest dimension (previously measuring 6.4 x 11.5 by 7.8 cm in greatest dimension). Other: No abdominal wall hernia Musculoskeletal: No acute bone abnormality. 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 Review of the MIP images confirms the above findings. IMPRESSION: Technically limited examination. The examination is essentially nondiagnostic for the  assessment for pulmonary embolism. No pulmonary arterial enlargement or CT evidence of right heart strain. Interval development of a moderate right pleural effusion likely related to diaphragmatic involvement by hepatic dome serosal implants. Interval disease progression. 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 Slight interval increase in ascites. Electronically Signed   By: Fidela Salisbury MD   On: 04/21/2021 00:36   US Soft Tissue Head/Neck  Result Date: 04/21/2021 CLINICAL DATA:  55 year old female with right-sided neck pain. History of ovarian and pancreatic malignancy EXAM: ULTRASOUND OF HEAD/NECK SOFT TISSUES TECHNIQUE: Ultrasound examination of the head and neck soft tissues was performed in the area of clinical concern. COMPARISON:  CTA PE, 04/21/2021.  IR port placement, 02/18/2020 FINDINGS: Directed sonographic evaluation of the right neck in area of palpable concern demonstrated occlusive right internal jugular vein thrombus, without flow by Doppler evaluation. Noting this area of thrombus is cephalad to right IJ port catheter IMPRESSION: Occlusive right IJ vein thrombus, in the setting of ipsilateral port catheter. Suspect catheter-related thrombosis. Patient was escorted by the sonographer to the emergency room for further evaluation. Michaelle Birks, MD [facility] Electronically Signed   By: Michaelle Birks MD   On: 04/21/2021 08:09   CT ABDOMEN PELVIS W CONTRAST  Result Date: 04/21/2021 CLINICAL DATA:  PE suspected, high prob; Abdominal pain, acute, nonlocalized PE suspected and acute abdominal pain. Hx diabetes, GERD, HTN, kidney stone, and pancreatic cancer. EXAM: CT ANGIOGRAPHY CHEST CT ABDOMEN AND PELVIS WITH CONTRAST TECHNIQUE: Multidetector CT imaging of the chest was performed using the standard protocol during bolus  administration of intravenous contrast. Multiplanar CT image reconstructions and MIPs were obtained to evaluate the vascular anatomy. Multidetector CT imaging of the abdomen and pelvis was performed using the standard protocol during bolus administration of intravenous contrast. Due to multiple technical difficulties with access, bolus injection was stopped twice resulting in suboptimal opacification of the pulmonary arterial tree. CONTRAST:  156m OMNIPAQUE IOHEXOL 350 MG/ML SOLN COMPARISON:  01/27/2021 FINDINGS: CTA CHEST FINDINGS Cardiovascular: There is inadequate opacification  of the pulmonary arterial tree to evaluate for the presence of a intraluminal filling defect the pulmonary arterial tree is of normal caliber. Global cardiac size within normal limits. No pericardial effusion. The thoracic aorta is unremarkable. Bovine arch anatomy with wide patency of the arch vasculature proximally. Right internal jugular chest port tip is seen within the right atrium. Mediastinum/Nodes: No pathologic thoracic adenopathy. Esophagus unremarkable. Lungs/Pleura: Moderate right pleural effusion is present, new since prior examination with compressive atelectasis of the right lung. Lungs are otherwise clear. No pneumothorax. No pleural effusion on the left. Central airways are widely patent. Musculoskeletal: No lytic or blastic bone lesions. No acute bone abnormality. Review of the MIP images confirms the above findings. 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 No intra or extrahepatic biliary ductal dilation. Gallbladder absent. Pancreas: There is again noted mild fullness of the tail of the pancreas, however, a discrete mass is not clearly identified.  No pancreatic ductal dilation. No peripancreatic inflammatory changes identified. Spleen: Unremarkable Adrenals/Urinary Tract: The adrenal glands are unremarkable. The kidneys are normal in size and position. 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 There has developed mild distension of the renal pelves without associated caliectasis which may simply relate to aggressive hydration. The kidneys are otherwise unremarkable. The bladder is unremarkable. Stomach/Bowel: Serosal implants involving the transverse and sigmoid mesocolon are again identified. No evidence of obstruction. The stomach, small bowel, and large bowel are otherwise unremarkable. The appendix is not clearly identified, however, there are no secondary signs of appendicitis within the right lower quadrant. Trace ascites appears slightly increased since prior examination. Multiple peritoneal implants are again identified, most prominently within the periumbilical region and appear grossly stable since prior examination. No free intraperitoneal gas. Vascular/Lymphatic: No pathologic adenopathy within the abdomen and pelvis. The abdominal vasculature is unremarkable. Reproductive: The uterus is unremarkable. 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 Cystic collection within the right adnexa, by example, has significantly increased in overall dimension since prior examination, measuring 7.5 x 10.6 x 10.5 cm in greatest dimension (previously measuring 6.4 x 11.5 by 7.8 cm in greatest dimension). Other: No abdominal wall hernia Musculoskeletal: No acute bone abnormality. 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 Review of the MIP images confirms the above findings. IMPRESSION: Technically limited examination. The examination is essentially  nondiagnostic for the assessment for pulmonary embolism. No pulmonary arterial enlargement or CT evidence of right heart strain. Interval development of a moderate right pleural effusion likely related to diaphragmatic involvement by hepatic dome serosal implants. Interval disease progression. 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 Slight interval increase in ascites. Electronically Signed   By: Fidela Salisbury MD   On: 04/21/2021 00:36   US Venous Img Upper Uni Right(DVT)  Result Date: 04/20/2021 CLINICAL DATA:  Right neck pain, metastatic pancreatic cancer EXAM: RIGHT UPPER EXTREMITY VENOUS DOPPLER ULTRASOUND TECHNIQUE: Gray-scale sonography with graded compression, as well as color Doppler and duplex ultrasound were performed to evaluate the upper extremity deep venous system from the level of the  subclavian vein and including the jugular, axillary, basilic, radial, ulnar and upper cephalic vein. Spectral Doppler was utilized to evaluate flow at rest and with distal augmentation maneuvers. COMPARISON:  01/27/2021 FINDINGS: Contralateral Subclavian Vein: Respiratory phasicity is normal and symmetric with the symptomatic side. No evidence of thrombus. Normal compressibility. Internal Jugular Vein: There is occlusive thrombus within the right internal jugular vein, extending into the right innominate vein. Subclavian Vein: No evidence of thrombus. Normal compressibility, respiratory phasicity and response to augmentation. Axillary Vein: No evidence of thrombus. Normal compressibility, respiratory phasicity and response to augmentation. Cephalic Vein: No evidence of thrombus. Normal compressibility, respiratory phasicity and response to augmentation. Basilic Vein: No evidence of thrombus. Normal compressibility, respiratory phasicity and response to augmentation. Brachial Veins: No evidence of  thrombus. Normal compressibility, respiratory phasicity and response to augmentation. Radial Veins: No evidence of thrombus. Normal compressibility, respiratory phasicity and response to augmentation. Ulnar Veins: No evidence of thrombus. Normal compressibility, respiratory phasicity and response to augmentation. IMPRESSION: 1. 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 There is an indwelling right chest wall port via internal jugular approach. These results will be called to the ordering clinician or representative by the Radiologist Assistant, and communication documented in the PACS or [employer]. Electronically Signed   By: Randa Ngo M.D.   On: 04/20/2021 19:11   CT Biopsy  Result Date: 04/30/2021 CLINICAL DATA:  Progressive metastatic pancreatic carcinoma. EXAM: CT GUIDED CORE BIOPSY OF PERITONEAL MASS ANESTHESIA/SEDATION: Intravenous Fentanyl 67mg and Versed 1.539mwere administered as conscious sedation during continuous monitoring of the patient's level of consciousness and physiological / cardiorespiratory status by the radiology RN, with a total moderate sedation time of 11 minutes. PROCEDURE: The procedure risks, benefits, and alternatives were explained to the patient. Questions regarding the procedure were encouraged and answered. The patient understands and consents to the procedure. Select axial scans through the abdomen were obtained. The periumbilical anterior peritoneal lesion was localized and an appropriate skin entry site determined and marked. The operative field was prepped with chlorhexidinein a sterile fashion, and a sterile drape was applied covering the operative field. A sterile gown and sterile gloves were used for the procedure. Local anesthesia was provided with 1% Lidocaine. A 17 gauge trocar needle advanced to the margin of the lesion. After confirmatory CT, multiple coaxial 18 gauge core biopsy samples were  obtained, submitted in formalin. Postprocedure scans show no hemorrhage or other apparent complication. The patient tolerated the procedure well. COMPLICATIONS: None immediate FINDINGS: Anterior peritoneal mass at the level of the umbilicus was again localized. Multiple representative core biopsy samples obtained as above. IMPRESSION: 1. Technically successful CT-guided core biopsy, anterior peritoneal mass. Electronically Signed   By: D Lucrezia Europe.D.   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  She would be a good candidate for short course of palliative radiation therapy directed at these large ovarian masses.  Today, I talked to the patient and and son-in-law about the findings and work-up thus far.  We discussed the natural history of pancreatic cancer and general treatment, highlighting the role of radiotherapy in the management.  We discussed the available radiation techniques, and focused on the details of logistics and delivery.  We reviewed the anticipated acute and late sequelae associated with radiation in this setting.  The patient was encouraged to ask questions that  I answered to the best of my ability.  A patient consent form was discussed and signed.  We retained a copy for our records.  The patient would like to proceed with radiation and will be scheduled for CT simulation.  PLAN: Patient underwent CT simulation today.  She will begin her treatments on September 7.  Anticipate 2 weeks of palliative radiation therapy directed at the Krukenberg tumors.   60 minutes of total time was spent for this patient encounter, including preparation, face-to-face counseling with the patient and coordination of care, physical exam, and documentation of the encounter.   ------------------------------------------------  Blair Promise, PhD, MD  This document serves as a record of  services personally performed by Gery Pray, MD. It was created on his behalf by Roney Mans, a trained medical scribe. The creation of this record is based on the scribe's personal observations and the provider's statements to them. This document has been checked and approved by the attending provider.

## 2021-05-20 ENCOUNTER — Encounter (HOSPITAL_COMMUNITY): Payer: Self-pay | Admitting: Hematology & Oncology

## 2021-05-20 ENCOUNTER — Ambulatory Visit
Admission: RE | Admit: 2021-05-20 | Discharge: 2021-05-20 | Disposition: A | Discharge status: Home / Self Care | Payer: 59 | Source: Ambulatory Visit | Attending: Radiation Oncology | Admitting: Radiation Oncology

## 2021-05-20 ENCOUNTER — Other Ambulatory Visit: Payer: Self-pay

## 2021-05-20 ENCOUNTER — Encounter: Payer: Self-pay | Admitting: Radiation Oncology

## 2021-05-20 VITALS — BP 123/80 | HR 114 | Temp 97.2°F | Resp 20 | Ht 66.0 in | Wt 208.2 lb

## 2021-05-20 DIAGNOSIS — C252 Malignant neoplasm of tail of pancreas: Secondary | ICD-10-CM

## 2021-05-20 DIAGNOSIS — M47816 Spondylosis without myelopathy or radiculopathy, lumbar region: Secondary | ICD-10-CM | POA: Insufficient documentation

## 2021-05-20 DIAGNOSIS — J90 Pleural effusion, not elsewhere classified: Secondary | ICD-10-CM | POA: Diagnosis not present

## 2021-05-20 DIAGNOSIS — Z7952 Long term (current) use of systemic steroids: Secondary | ICD-10-CM | POA: Insufficient documentation

## 2021-05-20 DIAGNOSIS — K219 Gastro-esophageal reflux disease without esophagitis: Secondary | ICD-10-CM | POA: Insufficient documentation

## 2021-05-20 DIAGNOSIS — R188 Other ascites: Secondary | ICD-10-CM | POA: Diagnosis not present

## 2021-05-20 DIAGNOSIS — Z803 Family history of malignant neoplasm of breast: Secondary | ICD-10-CM | POA: Diagnosis not present

## 2021-05-20 DIAGNOSIS — C7963 Secondary malignant neoplasm of bilateral ovaries: Secondary | ICD-10-CM | POA: Diagnosis not present

## 2021-05-20 DIAGNOSIS — Z7984 Long term (current) use of oral hypoglycemic drugs: Secondary | ICD-10-CM | POA: Diagnosis not present

## 2021-05-20 DIAGNOSIS — Z808 Family history of malignant neoplasm of other organs or systems: Secondary | ICD-10-CM | POA: Diagnosis not present

## 2021-05-20 DIAGNOSIS — Z510 Encounter for antineoplastic radiation therapy: Secondary | ICD-10-CM | POA: Insufficient documentation

## 2021-05-20 DIAGNOSIS — C253 Malignant neoplasm of pancreatic duct: Secondary | ICD-10-CM

## 2021-05-20 DIAGNOSIS — Z79899 Other long term (current) drug therapy: Secondary | ICD-10-CM | POA: Insufficient documentation

## 2021-05-20 DIAGNOSIS — E119 Type 2 diabetes mellitus without complications: Secondary | ICD-10-CM | POA: Diagnosis not present

## 2021-05-20 DIAGNOSIS — Z8041 Family history of malignant neoplasm of ovary: Secondary | ICD-10-CM | POA: Insufficient documentation

## 2021-05-20 DIAGNOSIS — Z7901 Long term (current) use of anticoagulants: Secondary | ICD-10-CM | POA: Insufficient documentation

## 2021-05-20 DIAGNOSIS — Z8052 Family history of malignant neoplasm of bladder: Secondary | ICD-10-CM | POA: Insufficient documentation

## 2021-05-20 DIAGNOSIS — Z87442 Personal history of urinary calculi: Secondary | ICD-10-CM | POA: Diagnosis not present

## 2021-05-20 DIAGNOSIS — C251 Malignant neoplasm of body of pancreas: Secondary | ICD-10-CM | POA: Insufficient documentation

## 2021-05-20 DIAGNOSIS — C787 Secondary malignant neoplasm of liver and intrahepatic bile duct: Secondary | ICD-10-CM | POA: Insufficient documentation

## 2021-05-20 DIAGNOSIS — E785 Hyperlipidemia, unspecified: Secondary | ICD-10-CM | POA: Diagnosis not present

## 2021-05-20 DIAGNOSIS — C786 Secondary malignant neoplasm of retroperitoneum and peritoneum: Secondary | ICD-10-CM | POA: Insufficient documentation

## 2021-05-20 DIAGNOSIS — I10 Essential (primary) hypertension: Secondary | ICD-10-CM | POA: Insufficient documentation

## 2021-05-20 DIAGNOSIS — I82C11 Acute embolism and thrombosis of right internal jugular vein: Secondary | ICD-10-CM | POA: Insufficient documentation

## 2021-05-20 DIAGNOSIS — C259 Malignant neoplasm of pancreas, unspecified: Secondary | ICD-10-CM

## 2021-05-20 NOTE — Progress Notes (Signed)
See MD note for nursing evaluation. °

## 2021-05-24 ENCOUNTER — Encounter: Payer: Self-pay | Admitting: *Deleted

## 2021-05-24 DIAGNOSIS — Z7901 Long term (current) use of anticoagulants: Secondary | ICD-10-CM | POA: Diagnosis not present

## 2021-05-24 DIAGNOSIS — M791 Myalgia, unspecified site: Secondary | ICD-10-CM | POA: Diagnosis not present

## 2021-05-24 DIAGNOSIS — C7982 Secondary malignant neoplasm of genital organs: Secondary | ICD-10-CM | POA: Diagnosis not present

## 2021-05-24 DIAGNOSIS — J910 Malignant pleural effusion: Secondary | ICD-10-CM | POA: Diagnosis not present

## 2021-05-24 DIAGNOSIS — I82C11 Acute embolism and thrombosis of right internal jugular vein: Secondary | ICD-10-CM | POA: Diagnosis not present

## 2021-05-24 DIAGNOSIS — G893 Neoplasm related pain (acute) (chronic): Secondary | ICD-10-CM | POA: Diagnosis not present

## 2021-05-24 DIAGNOSIS — Z5111 Encounter for antineoplastic chemotherapy: Secondary | ICD-10-CM | POA: Diagnosis present

## 2021-05-24 DIAGNOSIS — R112 Nausea with vomiting, unspecified: Secondary | ICD-10-CM | POA: Diagnosis not present

## 2021-05-24 DIAGNOSIS — R5383 Other fatigue: Secondary | ICD-10-CM | POA: Diagnosis not present

## 2021-05-24 DIAGNOSIS — Z510 Encounter for antineoplastic radiation therapy: Secondary | ICD-10-CM | POA: Diagnosis not present

## 2021-05-24 DIAGNOSIS — C252 Malignant neoplasm of tail of pancreas: Secondary | ICD-10-CM | POA: Diagnosis present

## 2021-05-25 ENCOUNTER — Other Ambulatory Visit: Payer: Self-pay

## 2021-05-25 DIAGNOSIS — K8689 Other specified diseases of pancreas: Secondary | ICD-10-CM

## 2021-05-26 ENCOUNTER — Encounter: Payer: Self-pay | Admitting: *Deleted

## 2021-05-26 ENCOUNTER — Inpatient Hospital Stay: Payer: 59 | Attending: Hematology & Oncology

## 2021-05-26 ENCOUNTER — Other Ambulatory Visit: Payer: Self-pay

## 2021-05-26 ENCOUNTER — Inpatient Hospital Stay: Payer: 59

## 2021-05-26 ENCOUNTER — Ambulatory Visit
Admission: RE | Admit: 2021-05-26 | Discharge: 2021-05-26 | Disposition: A | Discharge status: Home / Self Care | Payer: 59 | Source: Ambulatory Visit | Attending: Radiation Oncology | Admitting: Radiation Oncology

## 2021-05-26 VITALS — BP 121/65 | HR 73 | Resp 18

## 2021-05-26 DIAGNOSIS — R112 Nausea with vomiting, unspecified: Secondary | ICD-10-CM

## 2021-05-26 DIAGNOSIS — R5383 Other fatigue: Secondary | ICD-10-CM | POA: Insufficient documentation

## 2021-05-26 DIAGNOSIS — Z5111 Encounter for antineoplastic chemotherapy: Secondary | ICD-10-CM | POA: Insufficient documentation

## 2021-05-26 DIAGNOSIS — M791 Myalgia, unspecified site: Secondary | ICD-10-CM | POA: Insufficient documentation

## 2021-05-26 DIAGNOSIS — J910 Malignant pleural effusion: Secondary | ICD-10-CM | POA: Insufficient documentation

## 2021-05-26 DIAGNOSIS — C7982 Secondary malignant neoplasm of genital organs: Secondary | ICD-10-CM | POA: Insufficient documentation

## 2021-05-26 DIAGNOSIS — Z510 Encounter for antineoplastic radiation therapy: Secondary | ICD-10-CM | POA: Insufficient documentation

## 2021-05-26 DIAGNOSIS — Z95828 Presence of other vascular implants and grafts: Secondary | ICD-10-CM

## 2021-05-26 DIAGNOSIS — Z7901 Long term (current) use of anticoagulants: Secondary | ICD-10-CM | POA: Insufficient documentation

## 2021-05-26 DIAGNOSIS — I82C11 Acute embolism and thrombosis of right internal jugular vein: Secondary | ICD-10-CM | POA: Insufficient documentation

## 2021-05-26 DIAGNOSIS — C252 Malignant neoplasm of tail of pancreas: Secondary | ICD-10-CM | POA: Insufficient documentation

## 2021-05-26 DIAGNOSIS — G893 Neoplasm related pain (acute) (chronic): Secondary | ICD-10-CM | POA: Insufficient documentation

## 2021-05-26 DIAGNOSIS — K8689 Other specified diseases of pancreas: Secondary | ICD-10-CM

## 2021-05-26 DIAGNOSIS — C259 Malignant neoplasm of pancreas, unspecified: Secondary | ICD-10-CM

## 2021-05-26 LAB — CBC WITH DIFFERENTIAL (CANCER CENTER ONLY)
Abs Immature Granulocytes: 0.02 10*3/uL (ref 0.00–0.07)
Basophils Absolute: 0 10*3/uL (ref 0.0–0.1)
Basophils Relative: 1 %
Eosinophils Absolute: 0.2 10*3/uL (ref 0.0–0.5)
Eosinophils Relative: 3 %
HCT: 36.8 % (ref 36.0–46.0)
Hemoglobin: 11.7 g/dL — ABNORMAL LOW (ref 12.0–15.0)
Immature Granulocytes: 0 %
Lymphocytes Relative: 21 %
Lymphs Abs: 1.4 10*3/uL (ref 0.7–4.0)
MCH: 29.2 pg (ref 26.0–34.0)
MCHC: 31.8 g/dL (ref 30.0–36.0)
MCV: 91.8 fL (ref 80.0–100.0)
Monocytes Absolute: 0.4 10*3/uL (ref 0.1–1.0)
Monocytes Relative: 5 %
Neutro Abs: 4.6 10*3/uL (ref 1.7–7.7)
Neutrophils Relative %: 70 %
Platelet Count: 195 10*3/uL (ref 150–400)
RBC: 4.01 MIL/uL (ref 3.87–5.11)
RDW: 16.1 % — ABNORMAL HIGH (ref 11.5–15.5)
WBC Count: 6.5 10*3/uL (ref 4.0–10.5)
nRBC: 0 % (ref 0.0–0.2)

## 2021-05-26 LAB — CMP (CANCER CENTER ONLY)
ALT: 8 U/L (ref 0–44)
AST: 12 U/L — ABNORMAL LOW (ref 15–41)
Albumin: 3.7 g/dL (ref 3.5–5.0)
Alkaline Phosphatase: 47 U/L (ref 38–126)
Anion gap: 7 (ref 5–15)
BUN: 13 mg/dL (ref 6–20)
CO2: 31 mmol/L (ref 22–32)
Calcium: 9.4 mg/dL (ref 8.9–10.3)
Chloride: 99 mmol/L (ref 98–111)
Creatinine: 0.56 mg/dL (ref 0.44–1.00)
GFR, Estimated: >60 mL/min (ref >60)
Glucose, Bld: 112 mg/dL — ABNORMAL HIGH (ref 70–99)
Potassium: 4 mmol/L (ref 3.5–5.1)
Sodium: 137 mmol/L (ref 135–145)
Total Bilirubin: 0.6 mg/dL (ref 0.3–1.2)
Total Protein: 7.1 g/dL (ref 6.5–8.1)

## 2021-05-26 MED ORDER — LORAZEPAM 2 MG/ML IJ SOLN: 0.5000 mg | Freq: Once | INTRAMUSCULAR | Status: DC

## 2021-05-26 MED ORDER — LEUCOVORIN CALCIUM INJECTION 350 MG
200.0000 mg/m2 | Freq: Once | INTRAVENOUS | Status: AC
  Administered 2021-05-26: 412 mg via INTRAVENOUS
  Filled 2021-05-26: qty 20.6

## 2021-05-26 MED ORDER — LORAZEPAM 1 MG PO TABS
0.5000 mg | ORAL_TABLET | Freq: Once | ORAL | Status: AC
  Administered 2021-05-26: 0.5 mg via ORAL
  Filled 2021-05-26: qty 1

## 2021-05-26 MED ORDER — SODIUM CHLORIDE 0.9 % IV SOLN
1200.0000 mg/m2 | INTRAVENOUS | Status: DC
  Administered 2021-05-26: 2450 mg via INTRAVENOUS
  Filled 2021-05-26: qty 49

## 2021-05-26 MED ORDER — PROCHLORPERAZINE MALEATE 10 MG PO TABS
10.0000 mg | ORAL_TABLET | Freq: Once | ORAL | Status: AC
  Administered 2021-05-26: 10 mg via ORAL
  Filled 2021-05-26: qty 1

## 2021-05-26 MED ORDER — SODIUM CHLORIDE 0.9% FLUSH
10.0000 mL | Freq: Once | INTRAVENOUS | Status: AC
  Administered 2021-05-26: 10 mL via INTRAVENOUS

## 2021-05-26 MED ORDER — SODIUM CHLORIDE 0.9 % IV SOLN: Freq: Once | INTRAVENOUS | Status: AC

## 2021-05-26 NOTE — Addendum Note (Signed)
Addended by: Lucile Crater on: 05/26/2021 10:48 AM   Modules accepted: Orders

## 2021-05-26 NOTE — Patient Instructions (Signed)

## 2021-05-26 NOTE — Patient Instructions (Signed)
[facility]  Discharge Instructions: Thank you for choosing [facility] to provide your oncology and hematology care.   If you have a lab appointment with the [facility], please go directly to the [facility] and check in at the registration area.  Wear comfortable clothing and clothing appropriate for easy access to any Portacath or PICC line.   We strive to give you quality time with your provider. You may need to reschedule your appointment if you arrive late (15 or more minutes).  Arriving late affects you and other patients whose appointments are after yours.  Also, if you miss three or more appointments without notifying the office, you may be dismissed from the clinic at the provider's discretion.      For prescription refill requests, have your pharmacy contact our office and allow 72 hours for refills to be completed.    Today you received the following chemotherapy and/or immunotherapy agents Leucovorin, 5FU      To help prevent nausea and vomiting after your treatment, we encourage you to take your nausea medication as directed.  BELOW ARE SYMPTOMS THAT SHOULD BE REPORTED IMMEDIATELY: *FEVER GREATER THAN 100.4 F (38 C) OR HIGHER *CHILLS OR SWEATING *NAUSEA AND VOMITING THAT IS NOT CONTROLLED WITH YOUR NAUSEA MEDICATION *UNUSUAL SHORTNESS OF BREATH *UNUSUAL BRUISING OR BLEEDING *URINARY PROBLEMS (pain or burning when urinating, or frequent urination) *BOWEL PROBLEMS (unusual diarrhea, constipation, pain near the anus) TENDERNESS IN MOUTH AND THROAT WITH OR WITHOUT PRESENCE OF ULCERS (sore throat, sores in mouth, or a toothache) UNUSUAL RASH, SWELLING OR PAIN  UNUSUAL VAGINAL DISCHARGE OR ITCHING   Items with * indicate a potential emergency and should be followed up as soon as possible or go to the Emergency Department if any problems should occur.  Please show the CHEMOTHERAPY ALERT CARD or IMMUNOTHERAPY ALERT CARD at check-in to  the Emergency Department and triage nurse. Should you have questions after your visit or need to cancel or reschedule your appointment, please contact [facility]  [phone] and follow the prompts.  Office hours are 8:00 a.m. to 4:30 p.m. Monday - Friday. Please note that voicemails left after 4:00 p.m. may not be returned until the following business day.  We are closed weekends and major holidays. You have access to a nurse at all times for urgent questions. Please call the main number to the clinic [phone] and follow the prompts.  For any non-urgent questions, you may also contact your provider using MyChart. We now offer e-Visits for anyone 53 and older to request care online for non-urgent symptoms. For details visit [email or web address].   Also download the MyChart app! Go to the app store, search "MyChart", open the app, select [facility], and log in with your MyChart username and password.  Due to Covid, a mask is required upon entering the hospital/clinic. If you do not have a mask, one will be given to you upon arrival. For doctor visits, patients may have 1 support person aged 42 or older with them. For treatment visits, patients cannot have anyone with them due to current Covid guidelines and our immunocompromised population.

## 2021-05-26 NOTE — Progress Notes (Signed)
Attempting to capture an accurate medication list for the patient. Pain regimen has been very complicated and patient has been taking medications that were prescribed back in April of 2022. See MyChart communication.   Oncology Nurse Navigator Documentation  Oncology Nurse Navigator Flowsheets 05/26/2021  Confirmed Diagnosis Date -  Diagnosis Status -  Planned Course of Treatment -  Phase of Treatment Radiation  Chemotherapy Pending- Reason: -  Chemotherapy Actual Start Date: -  Radiation Actual Start Date: 05/26/2021  Navigator Follow Up Date: 06/02/2021  Navigator Follow Up Reason: Follow-up Appointment;Chemotherapy  Navigator Location [facility]  Referral Date to RadOnc/MedOnc -  Navigator Encounter Type MyChart;Treatment  Telephone -  Treatment Initiated Date -  Patient Visit Type MedOnc  Treatment Phase Active Tx  Barriers/Navigation Needs Coordination of Care;Cultural Needs;Family Concerns;Language/Communication;Education  Education Pain/ Symptom Management  Interventions Education;Medication Assistance;Psycho-Social Support  Acuity Level 2-Minimal Needs (1-2 Barriers Identified)  Referrals -  Coordination of Care Other  Education Method Written  Support Groups/Services Friends and Family  Time Spent with Patient 79

## 2021-05-27 ENCOUNTER — Ambulatory Visit
Admission: RE | Admit: 2021-05-27 | Discharge: 2021-05-27 | Disposition: A | Discharge status: Home / Self Care | Payer: 59 | Source: Ambulatory Visit | Attending: Radiation Oncology | Admitting: Radiation Oncology

## 2021-05-27 ENCOUNTER — Other Ambulatory Visit: Payer: Self-pay

## 2021-05-27 ENCOUNTER — Inpatient Hospital Stay: Payer: 59

## 2021-05-27 ENCOUNTER — Encounter: Payer: Self-pay | Admitting: Hematology & Oncology

## 2021-05-27 ENCOUNTER — Other Ambulatory Visit (HOSPITAL_BASED_OUTPATIENT_CLINIC_OR_DEPARTMENT_OTHER): Payer: Self-pay

## 2021-05-27 VITALS — BP 149/76 | HR 103 | Temp 97.8°F | Resp 18

## 2021-05-27 DIAGNOSIS — Z5111 Encounter for antineoplastic chemotherapy: Secondary | ICD-10-CM | POA: Diagnosis not present

## 2021-05-27 DIAGNOSIS — Z95828 Presence of other vascular implants and grafts: Secondary | ICD-10-CM

## 2021-05-27 DIAGNOSIS — K8689 Other specified diseases of pancreas: Secondary | ICD-10-CM

## 2021-05-27 DIAGNOSIS — C252 Malignant neoplasm of tail of pancreas: Secondary | ICD-10-CM

## 2021-05-27 MED ORDER — SODIUM CHLORIDE 0.9% FLUSH
10.0000 mL | Freq: Once | INTRAVENOUS | Status: AC
  Administered 2021-05-27: 10 mL via INTRAVENOUS

## 2021-05-27 MED ORDER — PROCHLORPERAZINE MALEATE 10 MG PO TABS
10.0000 mg | ORAL_TABLET | Freq: Four times a day (QID) | ORAL | 3 refills | Status: AC | PRN
Start: 2021-05-27 — End: ?
  Filled 2021-05-27: qty 90, 23d supply, fill #0

## 2021-05-27 MED ORDER — HEPARIN SOD (PORK) LOCK FLUSH 100 UNIT/ML IV SOLN
500.0000 [IU] | Freq: Once | INTRAVENOUS | Status: AC
  Administered 2021-05-27: 500 [IU] via INTRAVENOUS

## 2021-05-27 NOTE — Patient Instructions (Signed)
Implanted Port Home Guide An implanted port is a device that is placed under the skin. It is usually placed in the chest. The device can be used to give IV medicine, to take blood, or for dialysis. You may have an implanted port if: You need IV medicine that would be irritating to the small veins in your hands or arms. You need IV medicines, such as antibiotics, for a long period of time. You need IV nutrition for a long period of time. You need dialysis. When you have a port, your health care provider can choose to use the port instead of veins in your arms for these procedures. You may have fewer limitations when using a port than you would if you used other types of long-term IVs, and you will likely be able to return to normal activities after your incision heals. An implanted port has two main parts: Reservoir. The reservoir is the part where a needle is inserted to give medicines or draw blood. The reservoir is round. After it is placed, it appears as a small, raised area under your skin. Catheter. The catheter is a thin, flexible tube that connects the reservoir to a vein. Medicine that is inserted into the reservoir goes into the catheter and then into the vein. How is my port accessed? To access your port: A numbing cream may be placed on the skin over the port site. Your health care provider will put on a mask and sterile gloves. The skin over your port will be cleaned carefully with a germ-killing soap and allowed to dry. Your health care provider will gently pinch the port and insert a needle into it. Your health care provider will check for a blood return to make sure the port is in the vein and is not clogged. If your port needs to remain accessed to get medicine continuously (constant infusion), your health care provider will place a clear bandage (dressing) over the needle site. The dressing and needle will need to be changed every week, or as told by your health care provider. What  is flushing? Flushing helps keep the port from getting clogged. Follow instructions from your health care provider about how and when to flush the port. Ports are usually flushed with saline solution or a medicine called heparin. The need for flushing will depend on how the port is used: If the port is only used from time to time to give medicines or draw blood, the port may need to be flushed: Before and after medicines have been given. Before and after blood has been drawn. As part of routine maintenance. Flushing may be recommended every 4-6 weeks. If a constant infusion is running, the port may not need to be flushed. Throw away any syringes in a disposal container that is meant for sharp items (sharps container). You can buy a sharps container from a pharmacy, or you can make one by using an empty hard plastic bottle with a cover. How long will my port stay implanted? The port can stay in for as long as your health care provider thinks it is needed. When it is time for the port to come out, a surgery will be done to remove it. The surgery will be similar to the procedure that was done to put the port in. Follow these instructions at home:  Flush your port as told by your health care provider. If you need an infusion over several days, follow instructions from your health care provider about how   to take care of your port site. Make sure you: Wash your hands with soap and water before you change your dressing. If soap and water are not available, use alcohol-based hand sanitizer. Change your dressing as told by your health care provider. Place any used dressings or infusion bags into a plastic bag. Throw that bag in the trash. Keep the dressing that covers the needle clean and dry. Do not get it wet. Do not use scissors or sharp objects near the tube. Keep the tube clamped, unless it is being used. Check your port site every day for signs of infection. Check for: Redness, swelling, or  pain. Fluid or blood. Pus or a bad smell. Protect the skin around the port site. Avoid wearing bra straps that rub or irritate the site. Protect the skin around your port from seat belts. Place a soft pad over your chest if needed. Bathe or shower as told by your health care provider. The site may get wet as long as you are not actively receiving an infusion. Return to your normal activities as told by your health care provider. Ask your health care provider what activities are safe for you. Carry a medical alert card or wear a medical alert bracelet at all times. This will let health care providers know that you have an implanted port in case of an emergency. Get help right away if: You have redness, swelling, or pain at the port site. You have fluid or blood coming from your port site. You have pus or a bad smell coming from the port site. You have a fever. Summary Implanted ports are usually placed in the chest for long-term IV access. Follow instructions from your health care provider about flushing the port and changing bandages (dressings). Take care of the area around your port by avoiding clothing that puts pressure on the area, and by watching for signs of infection. Protect the skin around your port from seat belts. Place a soft pad over your chest if needed. Get help right away if you have a fever or you have redness, swelling, pain, drainage, or a bad smell at the port site. This information is not intended to replace advice given to you by your health care provider. Make sure you discuss any questions you have with your health care provider. Document Revised: 11/25/2020 Document Reviewed: 01/20/2020 Elsevier Patient Education  2022 [employer]  

## 2021-05-28 ENCOUNTER — Ambulatory Visit
Admission: RE | Admit: 2021-05-28 | Discharge: 2021-05-28 | Disposition: A | Discharge status: Home / Self Care | Payer: 59 | Source: Ambulatory Visit | Attending: Radiation Oncology | Admitting: Radiation Oncology

## 2021-05-28 ENCOUNTER — Inpatient Hospital Stay: Payer: 59

## 2021-05-28 ENCOUNTER — Other Ambulatory Visit: Payer: Self-pay

## 2021-05-28 ENCOUNTER — Other Ambulatory Visit: Payer: Self-pay | Admitting: *Deleted

## 2021-05-28 VITALS — BP 115/68 | HR 90 | Temp 98.0°F | Resp 16

## 2021-05-28 DIAGNOSIS — C252 Malignant neoplasm of tail of pancreas: Secondary | ICD-10-CM

## 2021-05-28 DIAGNOSIS — Z5111 Encounter for antineoplastic chemotherapy: Secondary | ICD-10-CM | POA: Diagnosis not present

## 2021-05-28 DIAGNOSIS — R112 Nausea with vomiting, unspecified: Secondary | ICD-10-CM

## 2021-05-28 DIAGNOSIS — K8689 Other specified diseases of pancreas: Secondary | ICD-10-CM

## 2021-05-28 DIAGNOSIS — Z95828 Presence of other vascular implants and grafts: Secondary | ICD-10-CM

## 2021-05-28 DIAGNOSIS — C259 Malignant neoplasm of pancreas, unspecified: Secondary | ICD-10-CM

## 2021-05-28 LAB — CMP (CANCER CENTER ONLY)
ALT: 7 U/L (ref 0–44)
AST: 11 U/L — ABNORMAL LOW (ref 15–41)
Albumin: 3.7 g/dL (ref 3.5–5.0)
Alkaline Phosphatase: 46 U/L (ref 38–126)
Anion gap: 7 (ref 5–15)
BUN: 13 mg/dL (ref 6–20)
CO2: 29 mmol/L (ref 22–32)
Calcium: 9.2 mg/dL (ref 8.9–10.3)
Chloride: 99 mmol/L (ref 98–111)
Creatinine: 0.54 mg/dL (ref 0.44–1.00)
GFR, Estimated: >60 mL/min (ref >60)
Glucose, Bld: 159 mg/dL — ABNORMAL HIGH (ref 70–99)
Potassium: 4 mmol/L (ref 3.5–5.1)
Sodium: 135 mmol/L (ref 135–145)
Total Bilirubin: 0.8 mg/dL (ref 0.3–1.2)
Total Protein: 7.1 g/dL (ref 6.5–8.1)

## 2021-05-28 LAB — CBC WITH DIFFERENTIAL (CANCER CENTER ONLY)
Abs Immature Granulocytes: 0.02 10*3/uL (ref 0.00–0.07)
Basophils Absolute: 0 10*3/uL (ref 0.0–0.1)
Basophils Relative: 0 %
Eosinophils Absolute: 0.2 10*3/uL (ref 0.0–0.5)
Eosinophils Relative: 3 %
HCT: 36.9 % (ref 36.0–46.0)
Hemoglobin: 11.6 g/dL — ABNORMAL LOW (ref 12.0–15.0)
Immature Granulocytes: 0 %
Lymphocytes Relative: 13 %
Lymphs Abs: 0.7 10*3/uL (ref 0.7–4.0)
MCH: 28.7 pg (ref 26.0–34.0)
MCHC: 31.4 g/dL (ref 30.0–36.0)
MCV: 91.3 fL (ref 80.0–100.0)
Monocytes Absolute: 0.2 10*3/uL (ref 0.1–1.0)
Monocytes Relative: 4 %
Neutro Abs: 4.6 10*3/uL (ref 1.7–7.7)
Neutrophils Relative %: 80 %
Platelet Count: 199 10*3/uL (ref 150–400)
RBC: 4.04 MIL/uL (ref 3.87–5.11)
RDW: 15.9 % — ABNORMAL HIGH (ref 11.5–15.5)
WBC Count: 5.8 10*3/uL (ref 4.0–10.5)
nRBC: 0 % (ref 0.0–0.2)

## 2021-05-28 MED ORDER — HEPARIN SOD (PORK) LOCK FLUSH 100 UNIT/ML IV SOLN
500.0000 [IU] | Freq: Once | INTRAVENOUS | Status: AC
  Administered 2021-05-28: 500 [IU] via INTRAVENOUS

## 2021-05-28 MED ORDER — SODIUM CHLORIDE 0.9 % IV SOLN: INTRAVENOUS | Status: DC

## 2021-05-28 MED ORDER — SODIUM CHLORIDE 0.9% FLUSH
10.0000 mL | Freq: Once | INTRAVENOUS | Status: AC
  Administered 2021-05-28: 10 mL via INTRAVENOUS

## 2021-05-28 MED ORDER — LORAZEPAM 1 MG PO TABS
0.5000 mg | ORAL_TABLET | Freq: Once | ORAL | Status: AC
  Administered 2021-05-28: 0.5 mg via ORAL
  Filled 2021-05-28: qty 0.5

## 2021-05-31 ENCOUNTER — Other Ambulatory Visit: Payer: Self-pay

## 2021-05-31 ENCOUNTER — Ambulatory Visit
Admission: RE | Admit: 2021-05-31 | Discharge: 2021-05-31 | Disposition: A | Discharge status: Home / Self Care | Payer: 59 | Source: Ambulatory Visit | Attending: Radiation Oncology | Admitting: Radiation Oncology

## 2021-05-31 DIAGNOSIS — Z5111 Encounter for antineoplastic chemotherapy: Secondary | ICD-10-CM | POA: Diagnosis not present

## 2021-06-01 ENCOUNTER — Ambulatory Visit
Admission: RE | Admit: 2021-06-01 | Discharge: 2021-06-01 | Disposition: A | Discharge status: Home / Self Care | Payer: 59 | Source: Ambulatory Visit | Attending: Radiation Oncology | Admitting: Radiation Oncology

## 2021-06-01 DIAGNOSIS — Z5111 Encounter for antineoplastic chemotherapy: Secondary | ICD-10-CM | POA: Diagnosis not present

## 2021-06-02 ENCOUNTER — Inpatient Hospital Stay: Payer: 59

## 2021-06-02 ENCOUNTER — Encounter: Payer: Self-pay | Admitting: Hematology & Oncology

## 2021-06-02 ENCOUNTER — Ambulatory Visit
Admission: RE | Admit: 2021-06-02 | Discharge: 2021-06-02 | Disposition: A | Discharge status: Home / Self Care | Payer: 59 | Source: Ambulatory Visit | Attending: Radiation Oncology | Admitting: Radiation Oncology

## 2021-06-02 ENCOUNTER — Inpatient Hospital Stay (HOSPITAL_BASED_OUTPATIENT_CLINIC_OR_DEPARTMENT_OTHER): Payer: 59 | Admitting: Hematology & Oncology

## 2021-06-02 ENCOUNTER — Other Ambulatory Visit (HOSPITAL_BASED_OUTPATIENT_CLINIC_OR_DEPARTMENT_OTHER): Payer: Self-pay

## 2021-06-02 ENCOUNTER — Encounter: Payer: Self-pay | Admitting: *Deleted

## 2021-06-02 ENCOUNTER — Other Ambulatory Visit: Payer: Self-pay

## 2021-06-02 VITALS — BP 139/66 | Temp 98.3°F | Resp 17 | Wt 206.0 lb

## 2021-06-02 DIAGNOSIS — C252 Malignant neoplasm of tail of pancreas: Secondary | ICD-10-CM

## 2021-06-02 DIAGNOSIS — Z5111 Encounter for antineoplastic chemotherapy: Secondary | ICD-10-CM | POA: Diagnosis not present

## 2021-06-02 DIAGNOSIS — C253 Malignant neoplasm of pancreatic duct: Secondary | ICD-10-CM | POA: Diagnosis not present

## 2021-06-02 DIAGNOSIS — Z95828 Presence of other vascular implants and grafts: Secondary | ICD-10-CM

## 2021-06-02 DIAGNOSIS — C251 Malignant neoplasm of body of pancreas: Secondary | ICD-10-CM

## 2021-06-02 LAB — CBC WITH DIFFERENTIAL (CANCER CENTER ONLY)
Abs Immature Granulocytes: 0.09 10*3/uL — ABNORMAL HIGH (ref 0.00–0.07)
Basophils Absolute: 0 10*3/uL (ref 0.0–0.1)
Basophils Relative: 1 %
Eosinophils Absolute: 0.2 10*3/uL (ref 0.0–0.5)
Eosinophils Relative: 3 %
HCT: 36.7 % (ref 36.0–46.0)
Hemoglobin: 11.8 g/dL — ABNORMAL LOW (ref 12.0–15.0)
Immature Granulocytes: 2 %
Lymphocytes Relative: 10 %
Lymphs Abs: 0.5 10*3/uL — ABNORMAL LOW (ref 0.7–4.0)
MCH: 29.1 pg (ref 26.0–34.0)
MCHC: 32.2 g/dL (ref 30.0–36.0)
MCV: 90.4 fL (ref 80.0–100.0)
Monocytes Absolute: 0.2 10*3/uL (ref 0.1–1.0)
Monocytes Relative: 5 %
Neutro Abs: 3.9 10*3/uL (ref 1.7–7.7)
Neutrophils Relative %: 79 %
Platelet Count: 199 10*3/uL (ref 150–400)
RBC: 4.06 MIL/uL (ref 3.87–5.11)
RDW: 15.9 % — ABNORMAL HIGH (ref 11.5–15.5)
WBC Count: 4.8 10*3/uL (ref 4.0–10.5)
nRBC: 0 % (ref 0.0–0.2)

## 2021-06-02 LAB — URINALYSIS, COMPLETE (UACMP) WITH MICROSCOPIC
Bilirubin Urine: NEGATIVE
Glucose, UA: NEGATIVE mg/dL
Hgb urine dipstick: NEGATIVE
Ketones, ur: NEGATIVE mg/dL
Leukocytes,Ua: NEGATIVE
Nitrite: NEGATIVE
Protein, ur: NEGATIVE mg/dL
Specific Gravity, Urine: 1.03 (ref 1.005–1.030)
pH: 6 (ref 5.0–8.0)

## 2021-06-02 LAB — CMP (CANCER CENTER ONLY)
ALT: 7 U/L (ref 0–44)
AST: 11 U/L — ABNORMAL LOW (ref 15–41)
Albumin: 3.6 g/dL (ref 3.5–5.0)
Alkaline Phosphatase: 41 U/L (ref 38–126)
Anion gap: 9 (ref 5–15)
BUN: 10 mg/dL (ref 6–20)
CO2: 26 mmol/L (ref 22–32)
Calcium: 9.3 mg/dL (ref 8.9–10.3)
Chloride: 99 mmol/L (ref 98–111)
Creatinine: 0.5 mg/dL (ref 0.44–1.00)
GFR, Estimated: >60 mL/min (ref >60)
Glucose, Bld: 180 mg/dL — ABNORMAL HIGH (ref 70–99)
Potassium: 3.9 mmol/L (ref 3.5–5.1)
Sodium: 134 mmol/L — ABNORMAL LOW (ref 135–145)
Total Bilirubin: 0.7 mg/dL (ref 0.3–1.2)
Total Protein: 6.8 g/dL (ref 6.5–8.1)

## 2021-06-02 LAB — LACTATE DEHYDROGENASE: LDH: 117 U/L (ref 98–192)

## 2021-06-02 MED ORDER — PROCHLORPERAZINE MALEATE 10 MG PO TABS
10.0000 mg | ORAL_TABLET | Freq: Once | ORAL | Status: AC
  Administered 2021-06-02: 10 mg via ORAL
  Filled 2021-06-02: qty 1

## 2021-06-02 MED ORDER — HEPARIN SOD (PORK) LOCK FLUSH 100 UNIT/ML IV SOLN: 500.0000 [IU] | Freq: Once | INTRAVENOUS | Status: DC

## 2021-06-02 MED ORDER — SODIUM CHLORIDE 0.9 % IV SOLN: Freq: Once | INTRAVENOUS | Status: AC

## 2021-06-02 MED ORDER — HEPARIN SOD (PORK) LOCK FLUSH 100 UNIT/ML IV SOLN
500.0000 [IU] | Freq: Once | INTRAVENOUS | Status: DC | PRN
Start: 2021-06-02 — End: 2021-06-02

## 2021-06-02 MED ORDER — SODIUM CHLORIDE 0.9% FLUSH
10.0000 mL | Freq: Once | INTRAVENOUS | Status: AC
  Administered 2021-06-02: 10 mL via INTRAVENOUS

## 2021-06-02 MED ORDER — LEUCOVORIN CALCIUM INJECTION 350 MG
200.0000 mg/m2 | Freq: Once | INTRAVENOUS | Status: AC
  Administered 2021-06-02: 412 mg via INTRAVENOUS
  Filled 2021-06-02: qty 20.6

## 2021-06-02 MED ORDER — SODIUM CHLORIDE 0.9 % IV SOLN: 150.0000 mg | Freq: Once | INTRAVENOUS | Status: DC

## 2021-06-02 MED ORDER — SODIUM CHLORIDE 0.9 % IV SOLN
1200.0000 mg/m2 | INTRAVENOUS | Status: DC
  Administered 2021-06-02: 2450 mg via INTRAVENOUS
  Filled 2021-06-02: qty 49

## 2021-06-02 MED ORDER — SODIUM CHLORIDE 0.9% FLUSH: 10.0000 mL | INTRAVENOUS | Status: DC | PRN

## 2021-06-02 MED ORDER — SODIUM CHLORIDE 0.9 % IV SOLN
150.0000 mg | Freq: Once | INTRAVENOUS | Status: AC
  Administered 2021-06-02: 150 mg via INTRAVENOUS
  Filled 2021-06-02: qty 5

## 2021-06-02 MED ORDER — SODIUM CHLORIDE 0.9 % IV SOLN
10.0000 mg | Freq: Once | INTRAVENOUS | Status: AC
  Administered 2021-06-02: 10 mg via INTRAVENOUS
  Filled 2021-06-02: qty 10

## 2021-06-02 MED ORDER — GRANISETRON 3.1 MG/24HR TD PTCH
MEDICATED_PATCH | TRANSDERMAL | 4 refills | Status: DC
  Filled 2021-06-02: qty 1, 8d supply, fill #0

## 2021-06-02 NOTE — Progress Notes (Signed)
Patient here for her next cycle. She is c/o more nausea and vomiting. Dr Marin Olp sent prescription for [facility] patch. PA was required and completed. Outcome still pending.   Oncology Nurse Navigator Documentation  Oncology Nurse Navigator Flowsheets 06/02/2021  Confirmed Diagnosis Date -  Diagnosis Status -  Planned Course of Treatment -  Phase of Treatment -  Chemotherapy Pending- Reason: -  Chemotherapy Actual Start Date: -  Radiation Actual Start Date: -  Navigator Follow Up Date: 06/23/2021  Navigator Follow Up Reason: Follow-up Appointment;Chemotherapy  Navigator Location [facility]  Referral Date to RadOnc/MedOnc -  Navigator Encounter Type Appt/Treatment Plan Review;Treatment  Telephone -  Treatment Initiated Date -  Patient Visit Type MedOnc  Treatment Phase Active Tx  Barriers/Navigation Needs Coordination of Care;Cultural Needs;Family Concerns;Language/Communication;Education  Education Other  Interventions Education;Psycho-Social Support;Coordination of Care  Acuity Level 2-Minimal Needs (1-2 Barriers Identified)  Referrals -  Coordination of Care Other  Education Method Verbal  Support Groups/Services Friends and Family  Time Spent with Patient 32

## 2021-06-02 NOTE — Addendum Note (Signed)
Addended by: Carolynne Edouard B on: 06/02/2021 01:42 PM   Modules accepted: Orders

## 2021-06-02 NOTE — Progress Notes (Deleted)
Emend/dex added for increased n/v today.  Please f/u further need for antiemetics.  MD going to order sancuso patch

## 2021-06-02 NOTE — Patient Instructions (Signed)

## 2021-06-02 NOTE — Patient Instructions (Signed)
[facility]  Discharge Instructions: Thank you for choosing [facility] to provide your oncology and hematology care.   If you have a lab appointment with the [facility], please go directly to the [facility] and check in at the registration area.  Wear comfortable clothing and clothing appropriate for easy access to any Portacath or PICC line.   We strive to give you quality time with your provider. You may need to reschedule your appointment if you arrive late (15 or more minutes).  Arriving late affects you and other patients whose appointments are after yours.  Also, if you miss three or more appointments without notifying the office, you may be dismissed from the clinic at the provider's discretion.      For prescription refill requests, have your pharmacy contact our office and allow 72 hours for refills to be completed.    Today you received the following chemotherapy and/or immunotherapy agents Leucovorin, 5FU      To help prevent nausea and vomiting after your treatment, we encourage you to take your nausea medication as directed.  BELOW ARE SYMPTOMS THAT SHOULD BE REPORTED IMMEDIATELY: *FEVER GREATER THAN 100.4 F (38 C) OR HIGHER *CHILLS OR SWEATING *NAUSEA AND VOMITING THAT IS NOT CONTROLLED WITH YOUR NAUSEA MEDICATION *UNUSUAL SHORTNESS OF BREATH *UNUSUAL BRUISING OR BLEEDING *URINARY PROBLEMS (pain or burning when urinating, or frequent urination) *BOWEL PROBLEMS (unusual diarrhea, constipation, pain near the anus) TENDERNESS IN MOUTH AND THROAT WITH OR WITHOUT PRESENCE OF ULCERS (sore throat, sores in mouth, or a toothache) UNUSUAL RASH, SWELLING OR PAIN  UNUSUAL VAGINAL DISCHARGE OR ITCHING   Items with * indicate a potential emergency and should be followed up as soon as possible or go to the Emergency Department if any problems should occur.  Please show the CHEMOTHERAPY ALERT CARD or IMMUNOTHERAPY ALERT CARD at check-in to  the Emergency Department and triage nurse. Should you have questions after your visit or need to cancel or reschedule your appointment, please contact [facility]  [phone] and follow the prompts.  Office hours are 8:00 a.m. to 4:30 p.m. Monday - Friday. Please note that voicemails left after 4:00 p.m. may not be returned until the following business day.  We are closed weekends and major holidays. You have access to a nurse at all times for urgent questions. Please call the main number to the clinic [phone] and follow the prompts.  For any non-urgent questions, you may also contact your provider using MyChart. We now offer e-Visits for anyone 53 and older to request care online for non-urgent symptoms. For details visit [email or web address].   Also download the MyChart app! Go to the app store, search "MyChart", open the app, select [facility], and log in with your MyChart username and password.  Due to Covid, a mask is required upon entering the hospital/clinic. If you do not have a mask, one will be given to you upon arrival. For doctor visits, patients may have 1 support person aged 42 or older with them. For treatment visits, patients cannot have anyone with them due to current Covid guidelines and our immunocompromised population.

## 2021-06-02 NOTE — Progress Notes (Signed)
Hematology and Oncology Follow Up Visit  Angela Wilson [id number] 06-Mar-1966 55 y.o. 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  Unfortunately, she is having more in the way of nausea and vomiting.  This might be secondary to combination of radiation and chemotherapy.  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  It does not appear that she is on as much pain medication according to her daughter.  Problem is that we are trying to get her nausea and vomiting under better control.  I will see if a Sancuso patch can be prescribed.  I think this might be a good idea for her.  I will try to give her some IV Emend while she is here today.  Her last tumor markers were still going up.  The CA-125 was 812.  The CEA was 55.  She has had no diarrhea.  She has had no bleeding.  She is on Xarelto.  She has a thrombus in the right internal jugular vein.    Unfortunately, despite the biopsy, we cannot molecular markers.  There apparently was not enough material.  This is quite unfortunate.  Overall, I would have to say her performance status is ECOG 2.     Medications:  Allergies as of 06/02/2021       Reactions   Ivp Dye [iodinated Diagnostic Agents] Itching, Rash   Morphine And Related Hives   Penicillins Rash        Medication List        Accurate as of June 02, 2021  1:17 PM. If you  have any questions, ask your nurse or doctor.          STOP taking these medications    capecitabine 500 MG tablet Commonly known as: Xeloda Stopped by: Volanda Napoleon, MD   dicyclomine 10 MG capsule Commonly known as: Bentyl Stopped by: Volanda Napoleon, MD   multivitamin with minerals Tabs tablet Stopped by: Volanda Napoleon, MD   vitamin B-6 250 MG tablet Stopped by: Volanda Napoleon, MD       TAKE these medications    albuterol 108 (90 Base) MCG/ACT inhaler Commonly known as: VENTOLIN HFA Inhale 2 puffs into the lungs every 4 (four) hours as needed for wheezing or shortness of breath.   belladonna-PHENObarbital 16.2 MG/5ML Elix Commonly known as: Donnatal Take 5 mLs (16.2 mg total) by mouth 4 (four) times daily. What changed: Another medication with the same name was removed. Continue taking this medication, and follow the directions you see here. Changed by: Volanda Napoleon, MD   cetirizine 10 MG tablet Commonly known as: ZYRTEC Take 1 tablet (10 mg total) by mouth daily. What changed:  when to take this reasons to take this   dexamethasone 4 MG tablet Commonly known as: DECADRON Take 1 tablet (4 mg total) by mouth 2 (  two) times daily with a meal. For three (3) days following chemotherapy.   dronabinol 2.5 MG capsule Commonly known as: MARINOL Take 1 capsule (2.5 mg total) by mouth 2 (two) times daily before lunch and supper.   ergocalciferol 1.25 MG (50000 UT) capsule Commonly known as: VITAMIN D2 Take 1 capsule (50,000 Units total) by mouth once a week.   fentaNYL 75 MCG/HR Commonly known as: Palmer 1 patch onto the skin every other day.   gabapentin 300 MG capsule Commonly known as: NEURONTIN TAKE 2 CAPSULES BY MOUTH IN THE DAYTIME AND 2 CAPSULES AT BEDTIME   glipiZIDE 10 MG tablet Commonly known as: GLUCOTROL Take 1 tablet (10 mg total) by mouth 2 (two) times daily.   granisetron 3.1 MG/24HR Commonly known as: SANCUSO Apply to skin  starting 24 hours before chemotherapy. Remove after 7 days. Started by: Volanda Napoleon, MD   Hycodan 5-1.5 MG/5ML syrup Generic drug: HYDROcodone bit-homatropine Take 5 mLs by mouth every 6 (six) hours as needed for cough.   Lactulose 20 GM/30ML Soln Take 30 mLs (20 g total) by mouth 3 (three) times daily as needed.   lidocaine-prilocaine cream Commonly known as: EMLA APPLY TO PORT 1 HOUR BEFORE ACCESS.   LORazepam 0.5 MG tablet Commonly known as: ATIVAN Take 0.5 mg every six hours as needed for nausea.   metFORMIN 500 MG 24 hr tablet Commonly known as: Glucophage XR Take 1 tablet (500 mg total) by mouth daily with breakfast.   omeprazole 40 MG capsule Commonly known as: PRILOSEC Take 40 mg by mouth daily as needed (acid reflux).   ondansetron 4 MG tablet Commonly known as: ZOFRAN Take 1 tablet (4 mg total) by mouth every 6 (six) hours as needed for nausea.   oxyCODONE 5 MG immediate release tablet Commonly known as: Oxy IR/ROXICODONE Take 1 tablet (5 mg total) by mouth every 4 (four) hours as needed for severe pain.   polyethylene glycol 17 g packet Commonly known as: MIRALAX / GLYCOLAX Take 17 g by mouth daily as needed.   prochlorperazine 10 MG tablet Commonly known as: COMPAZINE Take 1 tablet (10 mg total) by mouth every 6 (six) hours as needed for nausea or vomiting.   rivaroxaban 20 MG Tabs tablet Commonly known as: XARELTO Take 1 tablet (20 mg total) by mouth daily with supper.        Allergies:  Allergies  Allergen Reactions   Ivp Dye [Iodinated Diagnostic Agents] Itching and Rash   Morphine And Related Hives   Penicillins Rash    Past Medical History, Surgical history, Social history, and Family History were reviewed and updated.  Review of Systems: Review of Systems  Constitutional:  Positive for malaise/fatigue.  HENT: Negative.    Eyes: Negative.   Respiratory: Negative.    Cardiovascular: Negative.   Gastrointestinal:  Positive for  abdominal pain.  Genitourinary: Negative.   Musculoskeletal:  Positive for myalgias.  Skin: Negative.   Neurological: Negative.   Endo/Heme/Allergies: Negative.   Psychiatric/Behavioral: Negative.      Physical Exam:  weight is 206 lb (93.4 kg). Her oral temperature is 98.3 F (36.8 C). Her blood pressure is 139/66. Her respiration is 17 and oxygen saturation is 96%.   Wt Readings from Last 3 Encounters:  06/02/21 206 lb (93.4 kg)  05/20/21 208 lb 4 oz (94.5 kg)  04/30/21 202 lb (91.6 kg)   Physical Exam Vitals reviewed.  HENT:     Head: Normocephalic and atraumatic.  Eyes:  Pupils: Pupils are equal, round, and reactive to light.  Cardiovascular:     Rate and Rhythm: Normal rate and regular rhythm.     Heart sounds: Normal heart sounds.  Pulmonary:     Effort: Pulmonary effort is normal.     Breath sounds: Normal breath sounds.  Abdominal:     General: Bowel sounds are normal.     Palpations: Abdomen is soft.  Musculoskeletal:        General: No tenderness or deformity. Normal range of motion.     Cervical back: Normal range of motion.  Lymphadenopathy:     Cervical: No cervical adenopathy.  Skin:    General: Skin is warm and dry.     Findings: No erythema or rash.  Neurological:     Mental Status: She is alert and oriented to person, place, and time.  Psychiatric:        Behavior: Behavior normal.        Thought Content: Thought content normal.        Judgment: Judgment normal.     Lab Results  Component Value Date   WBC 4.8 06/02/2021   HGB 11.8 (L) 06/02/2021   HCT 36.7 06/02/2021   MCV 90.4 06/02/2021   PLT 199 06/02/2021   Lab Results  Component Value Date   FERRITIN 284 06/15/2020   IRON 55 06/15/2020   TIBC 323 06/15/2020   UIBC 268 06/15/2020   IRONPCTSAT 17 (L) 06/15/2020   Lab Results  Component Value Date   RETICCTPCT 5.7 (H) 06/15/2020   RBC 4.06 06/02/2021   No results found for: KPAFRELGTCHN, LAMBDASER, KAPLAMBRATIO No results  found for: IGGSERUM, IGA, IGMSERUM No results found for: Ronnald Ramp, A1GS, A2GS, Violet Baldy, MSPIKE, SPEI   Chemistry      Component Value Date/Time   NA 134 (L) 06/02/2021 1157   NA 136 01/07/2020 1256   K 3.9 06/02/2021 1157   CL 99 06/02/2021 1157   CO2 26 06/02/2021 1157   BUN 10 06/02/2021 1157   BUN 10 01/07/2020 1256   CREATININE 0.50 06/02/2021 1157      Component Value Date/Time   CALCIUM 9.3 06/02/2021 1157   ALKPHOS 41 06/02/2021 1157   AST 11 (L) 06/02/2021 1157   ALT 7 06/02/2021 1157   BILITOT 0.7 06/02/2021 1157       Impression and Plan: Angela Wilson is a very pleasant 55 yo [name] female with metastatic pancreatic cancer.   Unfortunately, we are now seeing her pancreatic cancer that is being a bit more resilient to therapy.  Hopefully, she will respond to the weekly infusion of 5-FU.  Hopefully, the radiation therapy will also help.  Again this is all about quality of life.  I know that she is focused on going over to [place] sometime this fall.  I am not sure if she is will be able to make it.  We will just have to see how she does with our protocol.  This will be the last week of the 5-FU for this first cycle.  I will then plan to get her back in 3 weeks for the start of her second cycle.  I would not plan for a another set of scans until the end of October.  I just want her to feel better.  I just want the vomiting to get better.  I want her to be able to eat and enjoy her life.  Volanda Napoleon, MD 9/14/20221:17 PM

## 2021-06-03 ENCOUNTER — Encounter: Payer: Self-pay | Admitting: Hematology & Oncology

## 2021-06-03 ENCOUNTER — Inpatient Hospital Stay: Payer: 59

## 2021-06-03 ENCOUNTER — Encounter: Payer: Self-pay | Admitting: *Deleted

## 2021-06-03 ENCOUNTER — Ambulatory Visit
Admission: RE | Admit: 2021-06-03 | Discharge: 2021-06-03 | Disposition: A | Discharge status: Home / Self Care | Payer: 59 | Source: Ambulatory Visit | Attending: Radiation Oncology | Admitting: Radiation Oncology

## 2021-06-03 ENCOUNTER — Inpatient Hospital Stay (HOSPITAL_BASED_OUTPATIENT_CLINIC_OR_DEPARTMENT_OTHER): Payer: 59 | Admitting: Hematology & Oncology

## 2021-06-03 VITALS — BP 144/82 | HR 97 | Temp 98.2°F | Resp 18

## 2021-06-03 DIAGNOSIS — C251 Malignant neoplasm of body of pancreas: Secondary | ICD-10-CM

## 2021-06-03 DIAGNOSIS — C252 Malignant neoplasm of tail of pancreas: Secondary | ICD-10-CM

## 2021-06-03 DIAGNOSIS — Z5111 Encounter for antineoplastic chemotherapy: Secondary | ICD-10-CM | POA: Diagnosis not present

## 2021-06-03 LAB — CA 125: Cancer Antigen (CA) 125: 1341 U/mL — ABNORMAL HIGH (ref 0.0–38.1)

## 2021-06-03 LAB — CEA (IN HOUSE-CHCC): CEA (CHCC-In House): 79.86 ng/mL — ABNORMAL HIGH (ref 0.00–5.00)

## 2021-06-03 MED ORDER — SODIUM CHLORIDE 0.9% FLUSH
10.0000 mL | INTRAVENOUS | Status: DC | PRN
  Administered 2021-06-03: 10 mL via INTRAVENOUS

## 2021-06-03 MED ORDER — HEPARIN SOD (PORK) LOCK FLUSH 100 UNIT/ML IV SOLN
500.0000 [IU] | Freq: Once | INTRAVENOUS | Status: AC
  Administered 2021-06-03: 500 [IU] via INTRAVENOUS

## 2021-06-03 NOTE — Progress Notes (Signed)
Patient's daughter has several concerns per message sent via [place]. Shared these with Dr Marin Olp. He will see the patient and family member this afternoon when they come for pump dc. Rehab aware of plan.  Oncology Nurse Navigator Documentation  Oncology Nurse Navigator Flowsheets 06/03/2021  Confirmed Diagnosis Date -  Diagnosis Status -  Planned Course of Treatment -  Phase of Treatment -  Chemotherapy Pending- Reason: -  Chemotherapy Actual Start Date: -  Radiation Actual Start Date: -  Navigator Follow Up Date: 06/23/2021  Navigator Follow Up Reason: Follow-up Appointment  Navigator Location [facility]  Referral Date to RadOnc/MedOnc -  Navigator Encounter Type MyChart  Telephone -  Treatment Initiated Date -  Patient Visit Type MedOnc  Treatment Phase Active Tx  Barriers/Navigation Needs Coordination of Care;Cultural Needs;Family Concerns;Language/Communication;Education  Education -  Interventions Coordination of Care  Acuity Level 2-Minimal Needs (1-2 Barriers Identified)  Referrals -  Coordination of Care Appts  Education Method Written  Support Groups/Services Friends and Family  Time Spent with Patient 15

## 2021-06-03 NOTE — Progress Notes (Signed)
Hematology and Oncology Follow Up Visit  Angela Wilson [id number] [birth year] 55 y.o. 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 is here today with her daughter.  Unfortunately, she is having more in the way of nausea and vomiting.  This might be secondary to combination of radiation and chemotherapy.  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  It does not appear that she is on as much pain medication according to her daughter.  Problem is that we are trying to get her nausea and vomiting under better control.  I will see if a Sancuso patch can be prescribed.  I think this might be a good idea for her.  I will try to give her some IV Emend while she is here today.  Her last tumor markers were still going up.  The CA-125 was 812.  The CEA was 55.  She has had no diarrhea.  She has had no bleeding.  She is on Xarelto.  She has a thrombus in the right internal jugular vein.    Unfortunately, despite the biopsy, we cannot molecular markers.  There apparently was not enough material.  This is quite unfortunate.  Overall, I would have to say her performance status is ECOG 2.     Medications:  Allergies as of 06/03/2021       Reactions   Ivp Dye [iodinated Diagnostic Agents] Itching, Rash   Morphine And Related Hives   Penicillins Rash        Medication List        Accurate as of June 03, 2021  5:21 PM. If you  have any questions, ask your nurse or doctor.          albuterol 108 (90 Base) MCG/ACT inhaler Commonly known as: VENTOLIN HFA Inhale 2 puffs into the lungs every 4 (four) hours as needed for wheezing or shortness of breath.   belladonna-PHENObarbital 16.2 MG/5ML Elix Commonly known as: Donnatal Take 5 mLs (16.2 mg total) by mouth 4 (four) times daily.   cetirizine 10 MG tablet Commonly known as: ZYRTEC Take 1 tablet (10 mg total) by mouth daily. What changed:  when to take this reasons to take this   dexamethasone 4 MG tablet Commonly known as: DECADRON Take 1 tablet (4 mg total) by mouth 2 (two) times daily with a meal. For three (3) days following chemotherapy.   dronabinol 2.5 MG capsule Commonly known as: MARINOL Take 1 capsule (2.5 mg total) by mouth 2 (two) times daily before lunch and supper.   ergocalciferol 1.25 MG (50000 UT) capsule Commonly known as: VITAMIN D2 Take 1 capsule (50,000 Units total) by mouth once a week.   fentaNYL 75 MCG/HR Commonly known as: Velarde 1 patch onto the skin every other day.   gabapentin 300 MG capsule Commonly known as: NEURONTIN TAKE 2 CAPSULES BY MOUTH IN THE DAYTIME AND 2 CAPSULES AT  BEDTIME   glipiZIDE 10 MG tablet Commonly known as: GLUCOTROL Take 1 tablet (10 mg total) by mouth 2 (two) times daily.   granisetron 3.1 MG/24HR Commonly known as: SANCUSO Apply to skin starting 24 hours before chemotherapy. Remove after 7 days.   Hycodan 5-1.5 MG/5ML syrup Generic drug: HYDROcodone bit-homatropine Take 5 mLs by mouth every 6 (six) hours as needed for cough.   Lactulose 20 GM/30ML Soln Take 30 mLs (20 g total) by mouth 3 (three) times daily as needed.   lidocaine-prilocaine cream Commonly known as: EMLA APPLY TO PORT 1 HOUR BEFORE ACCESS.   LORazepam 0.5 MG tablet Commonly known as: ATIVAN Take 0.5 mg every six hours as needed for nausea.   metFORMIN 500 MG 24 hr tablet Commonly known as: Glucophage  XR Take 1 tablet (500 mg total) by mouth daily with breakfast.   omeprazole 40 MG capsule Commonly known as: PRILOSEC Take 40 mg by mouth daily as needed (acid reflux).   ondansetron 4 MG tablet Commonly known as: ZOFRAN Take 1 tablet (4 mg total) by mouth every 6 (six) hours as needed for nausea.   oxyCODONE 5 MG immediate release tablet Commonly known as: Oxy IR/ROXICODONE Take 1 tablet (5 mg total) by mouth every 4 (four) hours as needed for severe pain.   polyethylene glycol 17 g packet Commonly known as: MIRALAX / GLYCOLAX Take 17 g by mouth daily as needed.   prochlorperazine 10 MG tablet Commonly known as: COMPAZINE Take 1 tablet (10 mg total) by mouth every 6 (six) hours as needed for nausea or vomiting.   rivaroxaban 20 MG Tabs tablet Commonly known as: XARELTO Take 1 tablet (20 mg total) by mouth daily with supper.        Allergies:  Allergies  Allergen Reactions   Ivp Dye [Iodinated Diagnostic Agents] Itching and Rash   Morphine And Related Hives   Penicillins Rash    Past Medical History, Surgical history, Social history, and Family History were reviewed and updated.  Review of Systems: Review of Systems  Constitutional:  Positive for malaise/fatigue.  HENT: Negative.    Eyes: Negative.   Respiratory: Negative.    Cardiovascular: Negative.   Gastrointestinal:  Positive for abdominal pain.  Genitourinary: Negative.   Musculoskeletal:  Positive for myalgias.  Skin: Negative.   Neurological: Negative.   Endo/Heme/Allergies: Negative.   Psychiatric/Behavioral: Negative.      Physical Exam:  oral temperature is 98.2 F (36.8 C). Her blood pressure is 144/82 (abnormal) and her pulse is 97. Her respiration is 18 and oxygen saturation is 97%.   Wt Readings from Last 3 Encounters:  06/02/21 206 lb (93.4 kg)  05/20/21 208 lb 4 oz (94.5 kg)  04/30/21 202 lb (91.6 kg)   Physical Exam Vitals reviewed.  HENT:     Head: Normocephalic and atraumatic.   Eyes:     Pupils: Pupils are equal, round, and reactive to light.  Cardiovascular:     Rate and Rhythm: Normal rate and regular rhythm.     Heart sounds: Normal heart sounds.  Pulmonary:     Effort: Pulmonary effort is normal.     Breath sounds: Normal breath sounds.  Abdominal:     General: Bowel sounds are normal.     Palpations: Abdomen is soft.  Musculoskeletal:        General: No tenderness or deformity. Normal range of motion.     Cervical back: Normal range of motion.  Lymphadenopathy:     Cervical: No  cervical adenopathy.  Skin:    General: Skin is warm and dry.     Findings: No erythema or rash.  Neurological:     Mental Status: She is alert and oriented to person, place, and time.  Psychiatric:        Behavior: Behavior normal.        Thought Content: Thought content normal.        Judgment: Judgment normal.     Lab Results  Component Value Date   WBC 4.8 06/02/2021   HGB 11.8 (L) 06/02/2021   HCT 36.7 06/02/2021   MCV 90.4 06/02/2021   PLT 199 06/02/2021   Lab Results  Component Value Date   FERRITIN 284 06/15/2020   IRON 55 06/15/2020   TIBC 323 06/15/2020   UIBC 268 06/15/2020   IRONPCTSAT 17 (L) 06/15/2020   Lab Results  Component Value Date   RETICCTPCT 5.7 (H) 06/15/2020   RBC 4.06 06/02/2021   No results found for: KPAFRELGTCHN, LAMBDASER, KAPLAMBRATIO No results found for: IGGSERUM, IGA, IGMSERUM No results found for: Ronnald Ramp, A1GS, A2GS, Violet Baldy, MSPIKE, SPEI   Chemistry      Component Value Date/Time   NA 134 (L) 06/02/2021 1157   NA 136 01/07/2020 1256   K 3.9 06/02/2021 1157   CL 99 06/02/2021 1157   CO2 26 06/02/2021 1157   BUN 10 06/02/2021 1157   BUN 10 01/07/2020 1256   CREATININE 0.50 06/02/2021 1157      Component Value Date/Time   CALCIUM 9.3 06/02/2021 1157   ALKPHOS 41 06/02/2021 1157   AST 11 (L) 06/02/2021 1157   ALT 7 06/02/2021 1157   BILITOT 0.7 06/02/2021 1157        Impression and Plan: [name] is a very pleasant 55 yo [name] female with metastatic pancreatic cancer.   Unfortunately, we are now seeing her pancreatic cancer that is being a bit more resilient to therapy.  Hopefully, she will respond to the weekly infusion of 5-FU.  Hopefully, the radiation therapy will also help.  Again this is all about quality of life.  I know that she is focused on going over to [place] sometime this fall.  I am not sure if she is will be able to make it.  We will just have to see how she does with our protocol.  This will be the last week of the 5-FU for this first cycle.  I will then plan to get her back in 3 weeks for the start of her second cycle.  I would not plan for a another set of scans until the end of October.  I just want her to feel better.  I just want the vomiting to get better.  I want her to be able to eat and enjoy her life.  Volanda Napoleon, MD 9/15/20225:21 PM   ADDENDUM: Unfortunately, I do think that the treatment is not working.  We got back her tumor markers.  Her CA125 is 1340 and the CEA is 80.  I think that we have given the infusional 5-FU a chance.  She is getting radiation therapy right now.  She actually has less pain.  We did do biopsies.  Unfortunately we cannot get molecular markers off the biopsies.  As such, I will see about doing a liquid biopsy on her.  I told her daughter that we will see if she would qualify for a protocol.  I had previously called [facility].  They really  did not have anything available.  I will see about sending her to [facility] to see if they have any treatment options for her.  I just hate that we are now seeing disease that is almost refractory.  Again this is all about quality of life.  Ultimately, I told her daughter that we could consider sending her to [facility] down in [place], [place].  She still has a good performance status.  As such, I think that clinical trial options  would certainly be a possibility.  I know that we have exhausted every treatment possibility that I can think of.  Maybe, the liquid biopsy might give [place] a possibility for treatment.  I just want to do what we can for Ms. Chewning.  She is so nice.  She is already been so kind to all of our staff.  Lattie Haw, MD

## 2021-06-04 ENCOUNTER — Inpatient Hospital Stay: Payer: 59

## 2021-06-04 ENCOUNTER — Telehealth: Payer: Self-pay

## 2021-06-04 ENCOUNTER — Ambulatory Visit: Payer: 59 | Admitting: Hematology & Oncology

## 2021-06-04 ENCOUNTER — Other Ambulatory Visit: Payer: Self-pay

## 2021-06-04 ENCOUNTER — Encounter: Payer: Self-pay | Admitting: *Deleted

## 2021-06-04 ENCOUNTER — Ambulatory Visit
Admission: RE | Admit: 2021-06-04 | Discharge: 2021-06-04 | Disposition: A | Discharge status: Home / Self Care | Payer: 59 | Source: Ambulatory Visit | Attending: Radiation Oncology | Admitting: Radiation Oncology

## 2021-06-04 DIAGNOSIS — K8689 Other specified diseases of pancreas: Secondary | ICD-10-CM

## 2021-06-04 DIAGNOSIS — Z5111 Encounter for antineoplastic chemotherapy: Secondary | ICD-10-CM | POA: Diagnosis not present

## 2021-06-04 DIAGNOSIS — Z95828 Presence of other vascular implants and grafts: Secondary | ICD-10-CM

## 2021-06-04 DIAGNOSIS — C251 Malignant neoplasm of body of pancreas: Secondary | ICD-10-CM

## 2021-06-04 MED ORDER — HEPARIN SOD (PORK) LOCK FLUSH 100 UNIT/ML IV SOLN
500.0000 [IU] | Freq: Once | INTRAVENOUS | Status: AC
  Administered 2021-06-04: 500 [IU] via INTRAVENOUS

## 2021-06-04 MED ORDER — SODIUM CHLORIDE 0.9% FLUSH
10.0000 mL | Freq: Once | INTRAVENOUS | Status: AC
  Administered 2021-06-04: 10 mL via INTRAVENOUS

## 2021-06-04 NOTE — Telephone Encounter (Signed)
No 06/03/21 los but pt has appts already in place   [place]

## 2021-06-04 NOTE — Progress Notes (Signed)
Patient treatment has been discontinued due to lack of response. Since we were unable to obtain molecular studies with her most recent biopsy, [facility] Liquid biopsy will be sent today.   Dr Marin Olp would like patient to be referred to a oncologist at [facility] who treats Pancreatic cancer to see if they have any other treatment options for the patient. Referral placed.   Oncology Nurse Navigator Documentation  Oncology Nurse Navigator Flowsheets 06/04/2021  Confirmed Diagnosis Date -  Diagnosis Status -  Planned Course of Treatment -  Phase of Treatment -  Chemotherapy Pending- Reason: -  Chemotherapy Actual Start Date: -  Radiation Actual Start Date: -  Navigator Follow Up Date: 06/23/2021  Navigator Follow Up Reason: Follow-up Appointment  Navigator Location [facility]  Referral Date to RadOnc/MedOnc -  Navigator Encounter Type Appt/Treatment Plan Review  Telephone -  Treatment Initiated Date -  Patient Visit Type MedOnc  Treatment Phase Active Tx  Barriers/Navigation Needs Coordination of Care;Cultural Needs;Family Concerns;Language/Communication;Education  Education -  Interventions Referrals  Acuity Level 2-Minimal Needs (1-2 Barriers Identified)  Referrals Other  Coordination of Care -  Education Method -  Support Groups/Services Friends and Family  Time Spent with Patient 61

## 2021-06-04 NOTE — Patient Instructions (Signed)
Tunneled Central Venous Catheter Flushing Guide It is important to flush your tunneled central venous catheter each time you use it, both before and after you use it. Flushing your catheter will help prevent it from clogging. What are the risks? Risks may include: Infection. Air getting into the catheter and bloodstream. Supplies needed: A clean pair of gloves. A disinfecting wipe. Use an alcohol wipe, chlorhexidine wipe, or iodine wipe as told by your health care provider. A 10 mL syringe that has been prefilled with saline solution. An empty 10 mL syringe, if a substance called heparin was injected into your catheter. How to flush your catheter When you flush your catheter, make sure you follow any specific instructions from your health care provider or the manufacturer. These are general guidelines. Flushing your catheter before use If there is heparin in your catheter: Wash your hands with soap and water. Put on gloves. Scrub the injection cap for a minimum of 15 seconds with a disinfecting wipe. Unclamp the catheter. Attach the empty syringe to the injection cap. Pull the syringe plunger back and withdraw 10 mL of blood. Place the syringe into an appropriate waste container. Scrub the injection cap for 15 seconds with a disinfecting wipe. Attach the prefilled syringe to the injection cap. Flush the catheter by pushing the plunger forward until all the liquid from the syringe is in the catheter. Remove the syringe from the injection cap. Clamp the catheter. If there is no heparin in your catheter: Wash your hands with soap and water. Put on gloves. Scrub the injection cap for 15 seconds with a disinfecting wipe. Unclamp the catheter. Attach the prefilled syringe to the injection cap. Flush the catheter by pushing the plunger forward until 5 mL of the liquid from the syringe is in the catheter. Pull back on the syringe until you see blood in the catheter. If you have been asked  to collect any blood, follow your health care provider's instructions. Otherwise, flush the catheter with the rest of the solution from the syringe. Remove the syringe from the injection cap. Clamp the catheter.  Flushing your catheter after use Wash your hands with soap and water. Put on gloves. Scrub the injection cap for 15 seconds with a disinfecting wipe. Unclamp the catheter. Attach the prefilled syringe to the injection cap. Flush the catheter by pushing the plunger forward until all of the liquid from the syringe is in the catheter. Remove the syringe from the injection cap. Clamp the catheter. Problems and solutions If blood cannot be completely cleared from the injection cap, you may need to have the injection cap replaced. If the catheter is difficult to flush, use the pulsing method. The pulsing method involves pushing only a few milliliters of solution into the catheter at a time and pausing between pushes. If you do not see blood in the catheter when you pull back on the syringe, change your body position, such as by raising your arms above your head. Take a deep breath and cough. Then, pull back on the syringe. If you still do not see blood, flush the catheter with a small amount of solution. Then, change positions again and take a breath or cough. Pull back on the syringe again. If you still do not see blood, finish flushing the catheter and contact your health care provider. Do not use your catheter until your health care provider says it is okay. General tips Have someone help you flush your catheter, if possible. Do not force fluid   through your catheter. Do not use a syringe that is larger or smaller than 10 mL. Using a smaller syringe can make the catheter burst. Do not use your catheter without flushing it first if it has heparin in it. Contact a health care provider if: You cannot see any blood in the catheter when you flush it before using it. Your catheter is difficult  to flush. Get help right away if: You cannot flush the catheter. The catheter leaks when you flush it or when there is fluid in it. There are cracks or breaks in the catheter. Summary It is important to flush your tunneled central venous catheter each time you use it, both before and after you use it. Scrub the injection cap for 15 seconds with a disinfecting wipe before and after you flush it. When you flush your catheter, make sure you follow any specific instructions from your health care provider or the manufacturer. Get help right away if you cannot flush the catheter. This information is not intended to replace advice given to you by your health care provider. Make sure you discuss any questions you have with your health care provider. Document Revised: 11/14/2019 Document Reviewed: 11/21/2018 Elsevier Patient Education  2022 [employer]  

## 2021-06-05 ENCOUNTER — Other Ambulatory Visit: Payer: Self-pay | Admitting: Oncology

## 2021-06-05 MED ORDER — DIPHENOXYLATE-ATROPINE 2.5-0.025 MG PO TABS: 1.0000 | ORAL_TABLET | Freq: Four times a day (QID) | ORAL | 0 refills | Status: DC | PRN

## 2021-06-07 ENCOUNTER — Other Ambulatory Visit: Payer: Self-pay

## 2021-06-07 ENCOUNTER — Inpatient Hospital Stay: Payer: 59

## 2021-06-07 ENCOUNTER — Other Ambulatory Visit: Payer: Self-pay | Admitting: *Deleted

## 2021-06-07 ENCOUNTER — Ambulatory Visit: Admission: RE | Admit: 2021-06-07 | Payer: 59 | Source: Ambulatory Visit

## 2021-06-07 ENCOUNTER — Other Ambulatory Visit (HOSPITAL_BASED_OUTPATIENT_CLINIC_OR_DEPARTMENT_OTHER): Payer: Self-pay

## 2021-06-07 ENCOUNTER — Encounter: Payer: Self-pay | Admitting: *Deleted

## 2021-06-07 ENCOUNTER — Other Ambulatory Visit: Payer: Self-pay | Admitting: Hematology & Oncology

## 2021-06-07 VITALS — BP 121/54 | HR 94 | Temp 98.0°F | Resp 16

## 2021-06-07 DIAGNOSIS — K8689 Other specified diseases of pancreas: Secondary | ICD-10-CM

## 2021-06-07 DIAGNOSIS — C252 Malignant neoplasm of tail of pancreas: Secondary | ICD-10-CM

## 2021-06-07 DIAGNOSIS — Z5111 Encounter for antineoplastic chemotherapy: Secondary | ICD-10-CM | POA: Diagnosis not present

## 2021-06-07 DIAGNOSIS — C251 Malignant neoplasm of body of pancreas: Secondary | ICD-10-CM

## 2021-06-07 LAB — CBC WITH DIFFERENTIAL (CANCER CENTER ONLY)
Abs Immature Granulocytes: 0.04 10*3/uL (ref 0.00–0.07)
Basophils Absolute: 0 10*3/uL (ref 0.0–0.1)
Basophils Relative: 0 %
Eosinophils Absolute: 0.2 10*3/uL (ref 0.0–0.5)
Eosinophils Relative: 4 %
HCT: 34.8 % — ABNORMAL LOW (ref 36.0–46.0)
Hemoglobin: 11.3 g/dL — ABNORMAL LOW (ref 12.0–15.0)
Immature Granulocytes: 1 %
Lymphocytes Relative: 6 %
Lymphs Abs: 0.3 10*3/uL — ABNORMAL LOW (ref 0.7–4.0)
MCH: 29 pg (ref 26.0–34.0)
MCHC: 32.5 g/dL (ref 30.0–36.0)
MCV: 89.2 fL (ref 80.0–100.0)
Monocytes Absolute: 0.4 10*3/uL (ref 0.1–1.0)
Monocytes Relative: 7 %
Neutro Abs: 4.1 10*3/uL (ref 1.7–7.7)
Neutrophils Relative %: 82 %
Platelet Count: 176 10*3/uL (ref 150–400)
RBC: 3.9 MIL/uL (ref 3.87–5.11)
RDW: 15.9 % — ABNORMAL HIGH (ref 11.5–15.5)
WBC Count: 5 10*3/uL (ref 4.0–10.5)
nRBC: 0 % (ref 0.0–0.2)

## 2021-06-07 LAB — COMPREHENSIVE METABOLIC PANEL
ALT: 7 U/L (ref 0–44)
AST: 8 U/L — ABNORMAL LOW (ref 15–41)
Albumin: 3.5 g/dL (ref 3.5–5.0)
Alkaline Phosphatase: 47 U/L (ref 38–126)
Anion gap: 8 (ref 5–15)
BUN: 7 mg/dL (ref 6–20)
CO2: 26 mmol/L (ref 22–32)
Calcium: 8.8 mg/dL — ABNORMAL LOW (ref 8.9–10.3)
Chloride: 97 mmol/L — ABNORMAL LOW (ref 98–111)
Creatinine, Ser: 0.48 mg/dL (ref 0.44–1.00)
GFR, Estimated: >60 mL/min (ref >60)
Glucose, Bld: 160 mg/dL — ABNORMAL HIGH (ref 70–99)
Potassium: 3.8 mmol/L (ref 3.5–5.1)
Sodium: 131 mmol/L — ABNORMAL LOW (ref 135–145)
Total Bilirubin: 0.6 mg/dL (ref 0.3–1.2)
Total Protein: 6.7 g/dL (ref 6.5–8.1)

## 2021-06-07 MED ORDER — SODIUM CHLORIDE 0.9% FLUSH
10.0000 mL | Freq: Once | INTRAVENOUS | Status: AC
Start: 2021-06-07 — End: 2021-06-07
  Administered 2021-06-07: 10 mL via INTRAVENOUS

## 2021-06-07 MED ORDER — HYDROMORPHONE HCL 1 MG/ML IJ SOLN: 2.0000 mg | Freq: Once | INTRAMUSCULAR | Status: DC

## 2021-06-07 MED ORDER — HYDROMORPHONE HCL 1 MG/ML IJ SOLN
2.0000 mg | Freq: Once | INTRAMUSCULAR | Status: AC
  Administered 2021-06-07: 2 mg via INTRAVENOUS
  Filled 2021-06-07: qty 2

## 2021-06-07 MED ORDER — HEPARIN SOD (PORK) LOCK FLUSH 100 UNIT/ML IV SOLN
500.0000 [IU] | Freq: Once | INTRAVENOUS | Status: AC
Start: 2021-06-07 — End: 2021-06-07
  Administered 2021-06-07: 500 [IU] via INTRAVENOUS

## 2021-06-07 MED ORDER — SODIUM CHLORIDE 0.9 % IV SOLN: 150.0000 mg | Freq: Once | INTRAVENOUS | Status: DC

## 2021-06-07 MED ORDER — OLANZAPINE 10 MG PO TABS
10.0000 mg | ORAL_TABLET | Freq: Every day | ORAL | 1 refills | Status: AC
  Filled 2021-06-07: qty 30, 30d supply, fill #0

## 2021-06-07 MED ORDER — SODIUM CHLORIDE 0.9 % IV SOLN
150.0000 mg | Freq: Once | INTRAVENOUS | Status: AC
  Administered 2021-06-07: 150 mg via INTRAVENOUS
  Filled 2021-06-07: qty 5

## 2021-06-07 MED ORDER — SODIUM CHLORIDE 0.9 % IV SOLN: Freq: Once | INTRAVENOUS | Status: AC

## 2021-06-07 MED ORDER — SODIUM CHLORIDE 0.9 % IV SOLN
20.0000 mg | Freq: Once | INTRAVENOUS | Status: AC
  Administered 2021-06-07: 20 mg via INTRAVENOUS
  Filled 2021-06-07: qty 2

## 2021-06-07 MED ORDER — LOPERAMIDE HCL 2 MG PO CAPS
2.0000 mg | ORAL_CAPSULE | Freq: Once | ORAL | Status: AC
  Administered 2021-06-07: 2 mg via ORAL
  Filled 2021-06-07: qty 1

## 2021-06-07 NOTE — Patient Instructions (Signed)

## 2021-06-08 ENCOUNTER — Other Ambulatory Visit (HOSPITAL_BASED_OUTPATIENT_CLINIC_OR_DEPARTMENT_OTHER): Payer: Self-pay

## 2021-06-08 ENCOUNTER — Ambulatory Visit: Payer: 59

## 2021-06-08 ENCOUNTER — Encounter: Payer: Self-pay | Admitting: Hematology & Oncology

## 2021-06-08 ENCOUNTER — Telehealth: Payer: Self-pay | Admitting: *Deleted

## 2021-06-08 ENCOUNTER — Other Ambulatory Visit: Payer: Self-pay | Admitting: *Deleted

## 2021-06-08 ENCOUNTER — Ambulatory Visit
Admission: RE | Admit: 2021-06-08 | Discharge: 2021-06-08 | Disposition: A | Discharge status: Home / Self Care | Payer: 59 | Source: Ambulatory Visit | Attending: Radiation Oncology | Admitting: Radiation Oncology

## 2021-06-08 DIAGNOSIS — Z5111 Encounter for antineoplastic chemotherapy: Secondary | ICD-10-CM | POA: Diagnosis not present

## 2021-06-08 MED ORDER — FENTANYL 75 MCG/HR TD PT72
1.0000 | MEDICATED_PATCH | TRANSDERMAL | 0 refills | Status: DC
  Filled 2021-06-08: qty 15, 30d supply, fill #0

## 2021-06-08 NOTE — Telephone Encounter (Signed)
Received a call from [facility] regarding the referral we sent for opinion on patients case.  They unfortunately will have to refuse this case because they dont take [employer] health insurance.  Dr Marin Olp notified.  Referral sent to [facility] instead

## 2021-06-09 ENCOUNTER — Other Ambulatory Visit: Payer: Self-pay | Admitting: *Deleted

## 2021-06-09 ENCOUNTER — Inpatient Hospital Stay: Payer: 59

## 2021-06-09 ENCOUNTER — Other Ambulatory Visit (HOSPITAL_BASED_OUTPATIENT_CLINIC_OR_DEPARTMENT_OTHER): Payer: Self-pay

## 2021-06-09 ENCOUNTER — Other Ambulatory Visit: Payer: Self-pay

## 2021-06-09 ENCOUNTER — Ambulatory Visit
Admission: RE | Admit: 2021-06-09 | Discharge: 2021-06-09 | Disposition: A | Discharge status: Home / Self Care | Payer: 59 | Source: Ambulatory Visit | Attending: Radiation Oncology | Admitting: Radiation Oncology

## 2021-06-09 DIAGNOSIS — Z5111 Encounter for antineoplastic chemotherapy: Secondary | ICD-10-CM | POA: Diagnosis not present

## 2021-06-09 DIAGNOSIS — C251 Malignant neoplasm of body of pancreas: Secondary | ICD-10-CM

## 2021-06-09 DIAGNOSIS — Z95828 Presence of other vascular implants and grafts: Secondary | ICD-10-CM

## 2021-06-09 DIAGNOSIS — K8689 Other specified diseases of pancreas: Secondary | ICD-10-CM

## 2021-06-09 DIAGNOSIS — C253 Malignant neoplasm of pancreatic duct: Secondary | ICD-10-CM

## 2021-06-09 LAB — COMPREHENSIVE METABOLIC PANEL
ALT: 8 U/L (ref 0–44)
AST: 9 U/L — ABNORMAL LOW (ref 15–41)
Albumin: 3.4 g/dL — ABNORMAL LOW (ref 3.5–5.0)
Alkaline Phosphatase: 47 U/L (ref 38–126)
Anion gap: 9 (ref 5–15)
BUN: 12 mg/dL (ref 6–20)
CO2: 26 mmol/L (ref 22–32)
Calcium: 8.7 mg/dL — ABNORMAL LOW (ref 8.9–10.3)
Chloride: 98 mmol/L (ref 98–111)
Creatinine, Ser: 0.53 mg/dL (ref 0.44–1.00)
GFR, Estimated: >60 mL/min (ref >60)
Glucose, Bld: 144 mg/dL — ABNORMAL HIGH (ref 70–99)
Potassium: 3.8 mmol/L (ref 3.5–5.1)
Sodium: 133 mmol/L — ABNORMAL LOW (ref 135–145)
Total Bilirubin: 0.5 mg/dL (ref 0.3–1.2)
Total Protein: 6.4 g/dL — ABNORMAL LOW (ref 6.5–8.1)

## 2021-06-09 LAB — CBC WITH DIFFERENTIAL (CANCER CENTER ONLY)
Abs Immature Granulocytes: 0.03 10*3/uL (ref 0.00–0.07)
Basophils Absolute: 0 10*3/uL (ref 0.0–0.1)
Basophils Relative: 0 %
Eosinophils Absolute: 0.3 10*3/uL (ref 0.0–0.5)
Eosinophils Relative: 6 %
HCT: 35.4 % — ABNORMAL LOW (ref 36.0–46.0)
Hemoglobin: 11.6 g/dL — ABNORMAL LOW (ref 12.0–15.0)
Immature Granulocytes: 1 %
Lymphocytes Relative: 9 %
Lymphs Abs: 0.5 10*3/uL — ABNORMAL LOW (ref 0.7–4.0)
MCH: 29.1 pg (ref 26.0–34.0)
MCHC: 32.8 g/dL (ref 30.0–36.0)
MCV: 88.9 fL (ref 80.0–100.0)
Monocytes Absolute: 0.6 10*3/uL (ref 0.1–1.0)
Monocytes Relative: 12 %
Neutro Abs: 3.9 10*3/uL (ref 1.7–7.7)
Neutrophils Relative %: 72 %
Platelet Count: 201 10*3/uL (ref 150–400)
RBC: 3.98 MIL/uL (ref 3.87–5.11)
RDW: 15.9 % — ABNORMAL HIGH (ref 11.5–15.5)
WBC Count: 5.3 10*3/uL (ref 4.0–10.5)
nRBC: 0 % (ref 0.0–0.2)

## 2021-06-09 LAB — GLUCOSE, CAPILLARY: Glucose-Capillary: 132 mg/dL — ABNORMAL HIGH (ref 70–99)

## 2021-06-09 LAB — PREALBUMIN: Prealbumin: 12.1 mg/dL — ABNORMAL LOW (ref 18–38)

## 2021-06-09 MED ORDER — SODIUM CHLORIDE 0.9 % IV SOLN
150.0000 mg | Freq: Once | INTRAVENOUS | Status: AC
  Administered 2021-06-09: 150 mg via INTRAVENOUS
  Filled 2021-06-09: qty 150

## 2021-06-09 MED ORDER — PALONOSETRON HCL INJECTION 0.25 MG/5ML
0.2500 mg | Freq: Once | INTRAVENOUS | Status: AC
  Administered 2021-06-09: 0.25 mg via INTRAVENOUS
  Filled 2021-06-09: qty 5

## 2021-06-09 MED ORDER — SODIUM CHLORIDE 0.9% FLUSH: 10.0000 mL | Freq: Once | INTRAVENOUS | Status: DC

## 2021-06-09 MED ORDER — HEPARIN SOD (PORK) LOCK FLUSH 100 UNIT/ML IV SOLN: 500.0000 [IU] | Freq: Once | INTRAVENOUS | Status: DC

## 2021-06-09 MED ORDER — LOPERAMIDE HCL 2 MG PO CAPS: 2.0000 mg | ORAL_CAPSULE | ORAL | Status: DC | PRN

## 2021-06-09 MED ORDER — HYDROMORPHONE HCL 1 MG/ML IJ SOLN
2.0000 mg | Freq: Once | INTRAMUSCULAR | Status: AC
Start: 2021-06-09 — End: 2021-06-09
  Administered 2021-06-09: 2 mg via INTRAVENOUS
  Filled 2021-06-09: qty 2

## 2021-06-09 MED ORDER — SODIUM CHLORIDE 0.9 % IV SOLN: Freq: Once | INTRAVENOUS | Status: AC

## 2021-06-09 MED ORDER — SODIUM CHLORIDE 0.9 % IV SOLN
20.0000 mg | Freq: Once | INTRAVENOUS | Status: AC
  Administered 2021-06-09: 20 mg via INTRAVENOUS
  Filled 2021-06-09: qty 20

## 2021-06-09 MED ORDER — LOPERAMIDE HCL 2 MG PO CAPS
4.0000 mg | ORAL_CAPSULE | ORAL | Status: DC | PRN
  Administered 2021-06-09: 4 mg via ORAL
  Filled 2021-06-09: qty 2

## 2021-06-09 NOTE — Patient Instructions (Signed)
Implanted Port Home Guide An implanted port is a device that is placed under the skin. It is usually placed in the chest. The device can be used to give IV medicine, to take blood, or for dialysis. You may have an implanted port if: You need IV medicine that would be irritating to the small veins in your hands or arms. You need IV medicines, such as antibiotics, for a long period of time. You need IV nutrition for a long period of time. You need dialysis. When you have a port, your health care provider can choose to use the port instead of veins in your arms for these procedures. You may have fewer limitations when using a port than you would if you used other types of long-term IVs, and you will likely be able to return to normal activities after your incision heals. An implanted port has two main parts: Reservoir. The reservoir is the part where a needle is inserted to give medicines or draw blood. The reservoir is round. After it is placed, it appears as a small, raised area under your skin. Catheter. The catheter is a thin, flexible tube that connects the reservoir to a vein. Medicine that is inserted into the reservoir goes into the catheter and then into the vein. How is my port accessed? To access your port: A numbing cream may be placed on the skin over the port site. Your health care provider will put on a mask and sterile gloves. The skin over your port will be cleaned carefully with a germ-killing soap and allowed to dry. Your health care provider will gently pinch the port and insert a needle into it. Your health care provider will check for a blood return to make sure the port is in the vein and is not clogged. If your port needs to remain accessed to get medicine continuously (constant infusion), your health care provider will place a clear bandage (dressing) over the needle site. The dressing and needle will need to be changed every week, or as told by your health care provider. What  is flushing? Flushing helps keep the port from getting clogged. Follow instructions from your health care provider about how and when to flush the port. Ports are usually flushed with saline solution or a medicine called heparin. The need for flushing will depend on how the port is used: If the port is only used from time to time to give medicines or draw blood, the port may need to be flushed: Before and after medicines have been given. Before and after blood has been drawn. As part of routine maintenance. Flushing may be recommended every 4-6 weeks. If a constant infusion is running, the port may not need to be flushed. Throw away any syringes in a disposal container that is meant for sharp items (sharps container). You can buy a sharps container from a pharmacy, or you can make one by using an empty hard plastic bottle with a cover. How long will my port stay implanted? The port can stay in for as long as your health care provider thinks it is needed. When it is time for the port to come out, a surgery will be done to remove it. The surgery will be similar to the procedure that was done to put the port in. Follow these instructions at home:  Flush your port as told by your health care provider. If you need an infusion over several days, follow instructions from your health care provider about how   to take care of your port site. Make sure you: Wash your hands with soap and water before you change your dressing. If soap and water are not available, use alcohol-based hand sanitizer. Change your dressing as told by your health care provider. Place any used dressings or infusion bags into a plastic bag. Throw that bag in the trash. Keep the dressing that covers the needle clean and dry. Do not get it wet. Do not use scissors or sharp objects near the tube. Keep the tube clamped, unless it is being used. Check your port site every day for signs of infection. Check for: Redness, swelling, or  pain. Fluid or blood. Pus or a bad smell. Protect the skin around the port site. Avoid wearing bra straps that rub or irritate the site. Protect the skin around your port from seat belts. Place a soft pad over your chest if needed. Bathe or shower as told by your health care provider. The site may get wet as long as you are not actively receiving an infusion. Return to your normal activities as told by your health care provider. Ask your health care provider what activities are safe for you. Carry a medical alert card or wear a medical alert bracelet at all times. This will let health care providers know that you have an implanted port in case of an emergency. Get help right away if: You have redness, swelling, or pain at the port site. You have fluid or blood coming from your port site. You have pus or a bad smell coming from the port site. You have a fever. Summary Implanted ports are usually placed in the chest for long-term IV access. Follow instructions from your health care provider about flushing the port and changing bandages (dressings). Take care of the area around your port by avoiding clothing that puts pressure on the area, and by watching for signs of infection. Protect the skin around your port from seat belts. Place a soft pad over your chest if needed. Get help right away if you have a fever or you have redness, swelling, pain, drainage, or a bad smell at the port site. This information is not intended to replace advice given to you by your health care provider. Make sure you discuss any questions you have with your health care provider. Document Revised: 11/25/2020 Document Reviewed: 01/20/2020 Elsevier Patient Education  2022 [employer]  

## 2021-06-11 ENCOUNTER — Other Ambulatory Visit (HOSPITAL_BASED_OUTPATIENT_CLINIC_OR_DEPARTMENT_OTHER): Payer: Self-pay

## 2021-06-15 ENCOUNTER — Encounter: Payer: Self-pay | Admitting: Hematology & Oncology

## 2021-06-22 ENCOUNTER — Telehealth: Payer: Self-pay | Admitting: *Deleted

## 2021-06-22 NOTE — Telephone Encounter (Signed)
Angela Wilson at [facility] to confirm receipt of referrals.  [name] confirmed information and will call patient to schedule./   Fax [phone].  Phone [phone]

## 2021-06-23 ENCOUNTER — Other Ambulatory Visit: Payer: Self-pay

## 2021-06-23 ENCOUNTER — Inpatient Hospital Stay: Payer: 59 | Attending: Hematology & Oncology

## 2021-06-23 ENCOUNTER — Other Ambulatory Visit (HOSPITAL_BASED_OUTPATIENT_CLINIC_OR_DEPARTMENT_OTHER): Payer: Self-pay

## 2021-06-23 ENCOUNTER — Inpatient Hospital Stay: Payer: 59

## 2021-06-23 ENCOUNTER — Inpatient Hospital Stay (HOSPITAL_BASED_OUTPATIENT_CLINIC_OR_DEPARTMENT_OTHER): Payer: 59 | Admitting: Hematology & Oncology

## 2021-06-23 ENCOUNTER — Encounter: Payer: Self-pay | Admitting: *Deleted

## 2021-06-23 ENCOUNTER — Encounter: Payer: Self-pay | Admitting: Hematology & Oncology

## 2021-06-23 VITALS — Wt 203.0 lb

## 2021-06-23 DIAGNOSIS — J910 Malignant pleural effusion: Secondary | ICD-10-CM | POA: Insufficient documentation

## 2021-06-23 DIAGNOSIS — G893 Neoplasm related pain (acute) (chronic): Secondary | ICD-10-CM | POA: Insufficient documentation

## 2021-06-23 DIAGNOSIS — R110 Nausea: Secondary | ICD-10-CM | POA: Diagnosis not present

## 2021-06-23 DIAGNOSIS — I82C11 Acute embolism and thrombosis of right internal jugular vein: Secondary | ICD-10-CM | POA: Insufficient documentation

## 2021-06-23 DIAGNOSIS — Z7901 Long term (current) use of anticoagulants: Secondary | ICD-10-CM | POA: Insufficient documentation

## 2021-06-23 DIAGNOSIS — Z79891 Long term (current) use of opiate analgesic: Secondary | ICD-10-CM | POA: Insufficient documentation

## 2021-06-23 DIAGNOSIS — Z79899 Other long term (current) drug therapy: Secondary | ICD-10-CM | POA: Diagnosis not present

## 2021-06-23 DIAGNOSIS — R197 Diarrhea, unspecified: Secondary | ICD-10-CM | POA: Insufficient documentation

## 2021-06-23 DIAGNOSIS — C253 Malignant neoplasm of pancreatic duct: Secondary | ICD-10-CM | POA: Diagnosis not present

## 2021-06-23 DIAGNOSIS — K8689 Other specified diseases of pancreas: Secondary | ICD-10-CM

## 2021-06-23 DIAGNOSIS — C772 Secondary and unspecified malignant neoplasm of intra-abdominal lymph nodes: Secondary | ICD-10-CM | POA: Diagnosis not present

## 2021-06-23 DIAGNOSIS — E119 Type 2 diabetes mellitus without complications: Secondary | ICD-10-CM | POA: Diagnosis not present

## 2021-06-23 DIAGNOSIS — C252 Malignant neoplasm of tail of pancreas: Secondary | ICD-10-CM | POA: Insufficient documentation

## 2021-06-23 DIAGNOSIS — Z7984 Long term (current) use of oral hypoglycemic drugs: Secondary | ICD-10-CM | POA: Diagnosis not present

## 2021-06-23 DIAGNOSIS — Z5111 Encounter for antineoplastic chemotherapy: Secondary | ICD-10-CM | POA: Insufficient documentation

## 2021-06-23 LAB — CMP (CANCER CENTER ONLY)
ALT: 6 U/L (ref 0–44)
AST: 10 U/L — ABNORMAL LOW (ref 15–41)
Albumin: 2.8 g/dL — ABNORMAL LOW (ref 3.5–5.0)
Alkaline Phosphatase: 52 U/L (ref 38–126)
Anion gap: 8 (ref 5–15)
BUN: 7 mg/dL (ref 6–20)
CO2: 26 mmol/L (ref 22–32)
Calcium: 8.3 mg/dL — ABNORMAL LOW (ref 8.9–10.3)
Chloride: 96 mmol/L — ABNORMAL LOW (ref 98–111)
Creatinine: 0.42 mg/dL — ABNORMAL LOW (ref 0.44–1.00)
GFR, Estimated: >60 mL/min (ref >60)
Glucose, Bld: 161 mg/dL — ABNORMAL HIGH (ref 70–99)
Potassium: 4 mmol/L (ref 3.5–5.1)
Sodium: 130 mmol/L — ABNORMAL LOW (ref 135–145)
Total Bilirubin: 0.4 mg/dL (ref 0.3–1.2)
Total Protein: 6.1 g/dL — ABNORMAL LOW (ref 6.5–8.1)

## 2021-06-23 LAB — CBC WITH DIFFERENTIAL (CANCER CENTER ONLY)
Abs Immature Granulocytes: 0.04 10*3/uL (ref 0.00–0.07)
Basophils Absolute: 0 10*3/uL (ref 0.0–0.1)
Basophils Relative: 0 %
Eosinophils Absolute: 0.2 10*3/uL (ref 0.0–0.5)
Eosinophils Relative: 3 %
HCT: 35.2 % — ABNORMAL LOW (ref 36.0–46.0)
Hemoglobin: 11.2 g/dL — ABNORMAL LOW (ref 12.0–15.0)
Immature Granulocytes: 1 %
Lymphocytes Relative: 14 %
Lymphs Abs: 0.8 10*3/uL (ref 0.7–4.0)
MCH: 27.6 pg (ref 26.0–34.0)
MCHC: 31.8 g/dL (ref 30.0–36.0)
MCV: 86.7 fL (ref 80.0–100.0)
Monocytes Absolute: 0.3 10*3/uL (ref 0.1–1.0)
Monocytes Relative: 6 %
Neutro Abs: 4.2 10*3/uL (ref 1.7–7.7)
Neutrophils Relative %: 76 %
Platelet Count: 214 10*3/uL (ref 150–400)
RBC: 4.06 MIL/uL (ref 3.87–5.11)
RDW: 15.9 % — ABNORMAL HIGH (ref 11.5–15.5)
WBC Count: 5.5 10*3/uL (ref 4.0–10.5)
nRBC: 0 % (ref 0.0–0.2)

## 2021-06-23 LAB — PREALBUMIN: Prealbumin: 5.5 mg/dL — ABNORMAL LOW (ref 18–38)

## 2021-06-23 LAB — CEA (IN HOUSE-CHCC): CEA (CHCC-In House): 75.31 ng/mL — ABNORMAL HIGH (ref 0.00–5.00)

## 2021-06-23 MED ORDER — SODIUM CHLORIDE 0.9 % IV SOLN: Freq: Once | INTRAVENOUS | Status: AC

## 2021-06-23 MED ORDER — SODIUM CHLORIDE 0.9 % IV SOLN
150.0000 mg | Freq: Once | INTRAVENOUS | Status: AC
  Administered 2021-06-23: 150 mg via INTRAVENOUS
  Filled 2021-06-23: qty 150

## 2021-06-23 MED ORDER — DRONABINOL 2.5 MG PO CAPS
2.5000 mg | ORAL_CAPSULE | Freq: Two times a day (BID) | ORAL | 0 refills | Status: AC
Start: 2021-06-23 — End: ?
  Filled 2021-06-23: qty 60, 30d supply, fill #0

## 2021-06-23 MED ORDER — HEPARIN SOD (PORK) LOCK FLUSH 100 UNIT/ML IV SOLN
500.0000 [IU] | Freq: Once | INTRAVENOUS | Status: AC
  Administered 2021-06-23: 500 [IU] via INTRAVENOUS

## 2021-06-23 MED ORDER — HYDROMORPHONE HCL 4 MG/ML IJ SOLN
4.0000 mg | Freq: Once | INTRAMUSCULAR | Status: AC
Start: 2021-06-23 — End: 2021-06-23
  Administered 2021-06-23: 4 mg via INTRAVENOUS
  Filled 2021-06-23: qty 1

## 2021-06-23 MED ORDER — DIPHENOXYLATE-ATROPINE 2.5-0.025 MG PO TABS
2.0000 | ORAL_TABLET | Freq: Four times a day (QID) | ORAL | 2 refills | Status: AC | PRN
  Filled 2021-06-23: qty 120, 15d supply, fill #0

## 2021-06-23 MED ORDER — HYDROMORPHONE HCL 4 MG PO TABS
4.0000 mg | ORAL_TABLET | ORAL | 0 refills | Status: AC | PRN
  Filled 2021-06-23: qty 120, 20d supply, fill #0

## 2021-06-23 MED ORDER — SODIUM CHLORIDE 0.9 % IV SOLN
40.0000 mg | Freq: Once | INTRAVENOUS | Status: AC
  Administered 2021-06-23: 40 mg via INTRAVENOUS
  Filled 2021-06-23: qty 4

## 2021-06-23 MED ORDER — SODIUM CHLORIDE 0.9% FLUSH
10.0000 mL | Freq: Once | INTRAVENOUS | Status: AC
  Administered 2021-06-23: 10 mL via INTRAVENOUS

## 2021-06-23 NOTE — Progress Notes (Signed)
Oncology Nurse Navigator Documentation  Oncology Nurse Navigator Flowsheets 06/23/2021  Confirmed Diagnosis Date -  Diagnosis Status -  Planned Course of Treatment -  Phase of Treatment -  Chemotherapy Pending- Reason: -  Chemotherapy Actual Start Date: -  Radiation Actual Start Date: -  Navigator Follow Up Date: 07/09/2021  Navigator Follow Up Reason: Follow-up Appointment  Navigator Location [facility]  Referral Date to RadOnc/MedOnc -  Navigator Encounter Type Appt/Treatment Plan Review  Telephone -  Treatment Initiated Date -  Patient Visit Type MedOnc  Treatment Phase Active Tx  Barriers/Navigation Needs Coordination of Care;Cultural Needs;Family Concerns;Language/Communication;Education  Education -  Interventions None Required  Acuity Level 2-Minimal Needs (1-2 Barriers Identified)  Referrals -  Coordination of Care -  Education Method -  Support Groups/Services Friends and Family  Time Spent with Patient 15

## 2021-06-23 NOTE — Progress Notes (Signed)
Hematology and Oncology Follow Up Visit  Todd Argabright [id number] 09-12-1966 55 y.o. 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 is here today with her daughter.  Unfortunately, the decline continues.  She is having diarrhea.  This happened after she had the radiation.  She stopped radiation 2 weeks ago.  Hopefully, the diarrhea will improve over time.  I told her that she can take Lomotil 2 pills every 4 hours if needed.  Her tumor levels keep going up.  Her CA125 is now 1340.  The CEA is 80.  She will see the oncologist at [facility] next week to see if there is any clinical trial that she may qualify for.  Hopefully, she will.  I think is going to be very tenuous if she does.  She is having occasional vomiting.  She is having more pain.  Dilaudid seems to help with this.  She is having no problems urinating..  There is no cough or shortness of breath.  She has had problems with a pleural effusion in the past.  She is on Xarelto.  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  Currently, I would have said that her performance status is probably ECOG 2.    Medications:  Allergies as of 06/23/2021       Reactions   Ivp Dye [iodinated Diagnostic Agents] Itching, Rash   Morphine And Related Hives   Penicillins Rash        Medication List        Accurate as of June 23, 2021 11:25 AM. If you have any  questions, ask your nurse or doctor.          albuterol 108 (90 Base) MCG/ACT inhaler Commonly known as: VENTOLIN HFA Inhale 2 puffs into the lungs every 4 (four) hours as needed for wheezing or shortness of breath.   amLODipine 5 MG tablet Commonly known as: NORVASC Take 5 mg by mouth daily.   belladonna-PHENObarbital 16.2 MG/5ML Elix Commonly known as: Donnatal Take 5 mLs (16.2 mg total) by mouth 4 (four) times daily.   cetirizine 10 MG tablet Commonly known as: ZYRTEC Take 1 tablet (10 mg total) by mouth daily. What changed:  when to take this reasons to take this   dexamethasone 4 MG tablet Commonly known as: DECADRON Take 1 tablet (4 mg total) by mouth 2 (two) times daily with a meal. For three (3) days following chemotherapy.   dicyclomine 10 MG capsule Commonly known as: BENTYL Take 10 mg by mouth 4 (four) times daily.   diphenoxylate-atropine 2.5-0.025 MG tablet Commonly known as: Lomotil Take 1 tablet by mouth 4 (four) times daily as needed for diarrhea or loose stools.   dronabinol 2.5 MG capsule Commonly known as: MARINOL Take 1 capsule (2.5 mg total) by mouth 2 (two) times daily before lunch and supper.   ergocalciferol 1.25 MG (50000  UT) capsule Commonly known as: VITAMIN D2 Take 1 capsule (50,000 Units total) by mouth once a week.   fentaNYL 75 MCG/HR Commonly known as: [place] 1 patch onto the skin every other day.   gabapentin 300 MG capsule Commonly known as: NEURONTIN TAKE 2 CAPSULES BY MOUTH IN THE DAYTIME AND 2 CAPSULES AT BEDTIME   glipiZIDE 10 MG tablet Commonly known as: GLUCOTROL Take 1 tablet (10 mg total) by mouth 2 (two) times daily.   granisetron 3.1 MG/24HR Commonly known as: SANCUSO Apply to skin starting 24 hours before chemotherapy. Remove after 7 days.   Hycodan 5-1.5 MG/5ML syrup Generic drug: HYDROcodone bit-homatropine Take 5 mLs by mouth every 6 (six) hours as needed for cough.   Lactulose 20 GM/30ML  Soln Take 30 mLs (20 g total) by mouth 3 (three) times daily as needed.   lidocaine-prilocaine cream Commonly known as: EMLA APPLY TO PORT 1 HOUR BEFORE ACCESS.   lisinopril 10 MG tablet Commonly known as: ZESTRIL Take 10 mg by mouth daily.   LORazepam 0.5 MG tablet Commonly known as: ATIVAN Take 0.5 mg every six hours as needed for nausea.   metFORMIN 500 MG 24 hr tablet Commonly known as: Glucophage XR Take 1 tablet (500 mg total) by mouth daily with breakfast.   OLANZapine 10 MG tablet Commonly known as: ZYPREXA Take 1 tablet (10 mg total) by mouth at bedtime. Take every day for nausea/vomiting   omeprazole 40 MG capsule Commonly known as: PRILOSEC Take 40 mg by mouth daily as needed (acid reflux).   ondansetron 4 MG tablet Commonly known as: ZOFRAN Take 1 tablet (4 mg total) by mouth every 6 (six) hours as needed for nausea.   oxyCODONE 5 MG immediate release tablet Commonly known as: Oxy IR/ROXICODONE Take 1 tablet (5 mg total) by mouth every 4 (four) hours as needed for severe pain.   polyethylene glycol 17 g packet Commonly known as: MIRALAX / GLYCOLAX Take 17 g by mouth daily as needed.   prochlorperazine 10 MG tablet Commonly known as: COMPAZINE Take 1 tablet (10 mg total) by mouth every 6 (six) hours as needed for nausea or vomiting.   rivaroxaban 20 MG Tabs tablet Commonly known as: XARELTO Take 1 tablet (20 mg total) by mouth daily with supper.        Allergies:  Allergies  Allergen Reactions   Ivp Dye [Iodinated Diagnostic Agents] Itching and Rash   Morphine And Related Hives   Penicillins Rash    Past Medical History, Surgical history, Social history, and Family History were reviewed and updated.  Review of Systems: Review of Systems  Constitutional:  Positive for malaise/fatigue.  HENT: Negative.    Eyes: Negative.   Respiratory: Negative.    Cardiovascular: Negative.   Gastrointestinal:  Positive for abdominal pain.  Genitourinary:  Negative.   Musculoskeletal:  Positive for myalgias.  Skin: Negative.   Neurological: Negative.   Endo/Heme/Allergies: Negative.   Psychiatric/Behavioral: Negative.      Physical Exam:  weight is 203 lb (92.1 kg).   Wt Readings from Last 3 Encounters:  06/23/21 203 lb (92.1 kg)  06/02/21 206 lb (93.4 kg)  05/20/21 208 lb 4 oz (94.5 kg)   Physical Exam Vitals reviewed.  HENT:     Head: Normocephalic and atraumatic.  Eyes:     Pupils: Pupils are equal, round, and reactive to light.  Cardiovascular:     Rate and Rhythm: Normal rate and regular rhythm.     Heart sounds:  Normal heart sounds.  Pulmonary:     Effort: Pulmonary effort is normal.     Breath sounds: Normal breath sounds.  Abdominal:     General: Bowel sounds are normal.     Palpations: Abdomen is soft.  Musculoskeletal:        General: No tenderness or deformity. Normal range of motion.     Cervical back: Normal range of motion.  Lymphadenopathy:     Cervical: No cervical adenopathy.  Skin:    General: Skin is warm and dry.     Findings: No erythema or rash.  Neurological:     Mental Status: She is alert and oriented to person, place, and time.  Psychiatric:        Behavior: Behavior normal.        Thought Content: Thought content normal.        Judgment: Judgment normal.     Lab Results  Component Value Date   WBC 5.5 06/23/2021   HGB 11.2 (L) 06/23/2021   HCT 35.2 (L) 06/23/2021   MCV 86.7 06/23/2021   PLT 214 06/23/2021   Lab Results  Component Value Date   FERRITIN 284 06/15/2020   IRON 55 06/15/2020   TIBC 323 06/15/2020   UIBC 268 06/15/2020   IRONPCTSAT 17 (L) 06/15/2020   Lab Results  Component Value Date   RETICCTPCT 5.7 (H) 06/15/2020   RBC 4.06 06/23/2021   No results found for: KPAFRELGTCHN, LAMBDASER, KAPLAMBRATIO No results found for: IGGSERUM, IGA, IGMSERUM No results found for: Odetta Pink, SPEI   Chemistry       Component Value Date/Time   NA 130 (L) 06/23/2021 0937   NA 136 01/07/2020 1256   K 4.0 06/23/2021 0937   CL 96 (L) 06/23/2021 0937   CO2 26 06/23/2021 0937   BUN 7 06/23/2021 0937   BUN 10 01/07/2020 1256   CREATININE 0.42 (L) 06/23/2021 0937      Component Value Date/Time   CALCIUM 8.3 (L) 06/23/2021 0937   ALKPHOS 52 06/23/2021 0937   AST 10 (L) 06/23/2021 0937   ALT 6 06/23/2021 0937   BILITOT 0.4 06/23/2021 0937       Impression and Plan: [name] is a very pleasant 55 yo [name] female with metastatic pancreatic cancer.   Our goal at this point is to make sure that she has some decent quality of life.  Pain control is critical.  She is on a fentanyl patch.  We will give her the Dilaudid which does seem to help.  IV Dilaudid it definitely helps.  It will be interesting to see what, if any, clinical trial there might be for her.  If there is not a clinical trial, I would certainly understand.  If there is no treatment recommendation by [facility], I would also would understand.  Her prealbumin is dropping.  The last time we checked it was 12.1.  Hopefully her diarrhea will improve.  We will plan to get her back to see [place] in another 3 weeks.  She may need to come in weekly for fluids if need be.   Volanda Napoleon, MD 10/5/202211:25 AM

## 2021-06-23 NOTE — Patient Instructions (Signed)

## 2021-06-23 NOTE — Patient Instructions (Signed)

## 2021-06-24 ENCOUNTER — Other Ambulatory Visit (HOSPITAL_BASED_OUTPATIENT_CLINIC_OR_DEPARTMENT_OTHER): Payer: Self-pay

## 2021-06-24 LAB — CA 125: Cancer Antigen (CA) 125: 1797 U/mL — ABNORMAL HIGH (ref 0.0–38.1)

## 2021-06-25 ENCOUNTER — Inpatient Hospital Stay: Payer: 59

## 2021-06-25 ENCOUNTER — Encounter: Payer: Self-pay | Admitting: *Deleted

## 2021-06-25 ENCOUNTER — Other Ambulatory Visit: Payer: Self-pay | Admitting: Hematology & Oncology

## 2021-06-25 MED ORDER — LORAZEPAM 0.5 MG PO TABS: ORAL_TABLET | ORAL | 0 refills | Status: AC

## 2021-06-30 ENCOUNTER — Other Ambulatory Visit: Payer: Self-pay

## 2021-06-30 ENCOUNTER — Telehealth: Payer: Self-pay

## 2021-06-30 ENCOUNTER — Other Ambulatory Visit: Payer: Self-pay | Admitting: *Deleted

## 2021-06-30 ENCOUNTER — Inpatient Hospital Stay: Payer: 59

## 2021-06-30 ENCOUNTER — Other Ambulatory Visit (HOSPITAL_BASED_OUTPATIENT_CLINIC_OR_DEPARTMENT_OTHER): Payer: Self-pay

## 2021-06-30 DIAGNOSIS — C253 Malignant neoplasm of pancreatic duct: Secondary | ICD-10-CM

## 2021-06-30 DIAGNOSIS — Z5111 Encounter for antineoplastic chemotherapy: Secondary | ICD-10-CM | POA: Diagnosis not present

## 2021-06-30 DIAGNOSIS — Z95828 Presence of other vascular implants and grafts: Secondary | ICD-10-CM

## 2021-06-30 DIAGNOSIS — C251 Malignant neoplasm of body of pancreas: Secondary | ICD-10-CM

## 2021-06-30 DIAGNOSIS — K8689 Other specified diseases of pancreas: Secondary | ICD-10-CM

## 2021-06-30 LAB — CMP (CANCER CENTER ONLY)
ALT: 6 U/L (ref 0–44)
AST: 12 U/L — ABNORMAL LOW (ref 15–41)
Albumin: 2.7 g/dL — ABNORMAL LOW (ref 3.5–5.0)
Alkaline Phosphatase: 56 U/L (ref 38–126)
Anion gap: 7 (ref 5–15)
BUN: 8 mg/dL (ref 6–20)
CO2: 26 mmol/L (ref 22–32)
Calcium: 8.6 mg/dL — ABNORMAL LOW (ref 8.9–10.3)
Chloride: 95 mmol/L — ABNORMAL LOW (ref 98–111)
Creatinine: 0.41 mg/dL — ABNORMAL LOW (ref 0.44–1.00)
GFR, Estimated: >60 mL/min (ref >60)
Glucose, Bld: 196 mg/dL — ABNORMAL HIGH (ref 70–99)
Potassium: 4 mmol/L (ref 3.5–5.1)
Sodium: 128 mmol/L — ABNORMAL LOW (ref 135–145)
Total Bilirubin: 0.4 mg/dL (ref 0.3–1.2)
Total Protein: 6.1 g/dL — ABNORMAL LOW (ref 6.5–8.1)

## 2021-06-30 LAB — CBC WITH DIFFERENTIAL (CANCER CENTER ONLY)
Abs Immature Granulocytes: 0.1 10*3/uL — ABNORMAL HIGH (ref 0.00–0.07)
Basophils Absolute: 0 10*3/uL (ref 0.0–0.1)
Basophils Relative: 0 %
Eosinophils Absolute: 0.1 10*3/uL (ref 0.0–0.5)
Eosinophils Relative: 1 %
HCT: 36.5 % (ref 36.0–46.0)
Hemoglobin: 11.7 g/dL — ABNORMAL LOW (ref 12.0–15.0)
Immature Granulocytes: 1 %
Lymphocytes Relative: 19 %
Lymphs Abs: 1.4 10*3/uL (ref 0.7–4.0)
MCH: 27.5 pg (ref 26.0–34.0)
MCHC: 32.1 g/dL (ref 30.0–36.0)
MCV: 85.7 fL (ref 80.0–100.0)
Monocytes Absolute: 0.6 10*3/uL (ref 0.1–1.0)
Monocytes Relative: 8 %
Neutro Abs: 5.2 10*3/uL (ref 1.7–7.7)
Neutrophils Relative %: 71 %
Platelet Count: 201 10*3/uL (ref 150–400)
RBC: 4.26 MIL/uL (ref 3.87–5.11)
RDW: 16.3 % — ABNORMAL HIGH (ref 11.5–15.5)
WBC Count: 7.4 10*3/uL (ref 4.0–10.5)
nRBC: 0 % (ref 0.0–0.2)

## 2021-06-30 LAB — PREALBUMIN: Prealbumin: <5 mg/dL — ABNORMAL LOW (ref 18–38)

## 2021-06-30 LAB — CEA (IN HOUSE-CHCC): CEA (CHCC-In House): 80.37 ng/mL — ABNORMAL HIGH (ref 0.00–5.00)

## 2021-06-30 MED ORDER — HEPARIN SOD (PORK) LOCK FLUSH 100 UNIT/ML IV SOLN
500.0000 [IU] | Freq: Once | INTRAVENOUS | Status: AC
  Administered 2021-06-30: 500 [IU] via INTRAVENOUS

## 2021-06-30 MED ORDER — SODIUM CHLORIDE 0.9 % IV SOLN: INTRAVENOUS | Status: DC

## 2021-06-30 MED ORDER — ONDANSETRON HCL 4 MG PO TABS
4.0000 mg | ORAL_TABLET | Freq: Four times a day (QID) | ORAL | 0 refills | Status: DC | PRN
  Filled 2021-06-30: qty 20, 5d supply, fill #0

## 2021-06-30 MED ORDER — HYDROMORPHONE HCL 4 MG/ML IJ SOLN
4.0000 mg | Freq: Once | INTRAMUSCULAR | Status: AC
  Administered 2021-06-30: 4 mg via INTRAVENOUS
  Filled 2021-06-30: qty 1

## 2021-06-30 MED ORDER — SODIUM CHLORIDE 0.9% FLUSH
10.0000 mL | INTRAVENOUS | Status: DC | PRN
  Administered 2021-06-30: 10 mL via INTRAVENOUS

## 2021-06-30 MED ORDER — SODIUM CHLORIDE 0.9 % IV SOLN
150.0000 mg | Freq: Once | INTRAVENOUS | Status: AC
  Administered 2021-06-30: 150 mg via INTRAVENOUS
  Filled 2021-06-30: qty 150

## 2021-06-30 MED ORDER — SODIUM CHLORIDE 0.9 % IV SOLN
40.0000 mg | Freq: Once | INTRAVENOUS | Status: AC
  Administered 2021-06-30: 40 mg via INTRAVENOUS
  Filled 2021-06-30: qty 4

## 2021-06-30 NOTE — Progress Notes (Signed)
Pt complaining of continued abdominal pain, nausea, vomiting and diarrhea through interpretation of pt.'s son in law.  Dr. Marin Olp notified.  Order received for pt to have Pepcid, Emend and Dilaudid today per order of Dr. Marin Olp.  Pt does state that nausea and pain have decreased at time of discharge.  Anti-emetics reviewed with pt.'s son in law.  Refill for Zofran sent in for pt.

## 2021-06-30 NOTE — Patient Instructions (Signed)
[facility]  Discharge Instructions: Thank you for choosing [facility] to provide your oncology and hematology care.   If you have a lab appointment with the [facility], please go directly to the [facility] and check in at the registration area.  Wear comfortable clothing and clothing appropriate for easy access to any Portacath or PICC line.   We strive to give you quality time with your provider. You may need to reschedule your appointment if you arrive late (15 or more minutes).  Arriving late affects you and other patients whose appointments are after yours.  Also, if you miss three or more appointments without notifying the office, you may be dismissed from the clinic at the provider's discretion.      For prescription refill requests, have your pharmacy contact our office and allow 72 hours for refills to be completed.    Today you received the following chemotherapy and/or immunotherapy agents Leucovorin, 5FU      To help prevent nausea and vomiting after your treatment, we encourage you to take your nausea medication as directed.  BELOW ARE SYMPTOMS THAT SHOULD BE REPORTED IMMEDIATELY: *FEVER GREATER THAN 100.4 F (38 C) OR HIGHER *CHILLS OR SWEATING *NAUSEA AND VOMITING THAT IS NOT CONTROLLED WITH YOUR NAUSEA MEDICATION *UNUSUAL SHORTNESS OF BREATH *UNUSUAL BRUISING OR BLEEDING *URINARY PROBLEMS (pain or burning when urinating, or frequent urination) *BOWEL PROBLEMS (unusual diarrhea, constipation, pain near the anus) TENDERNESS IN MOUTH AND THROAT WITH OR WITHOUT PRESENCE OF ULCERS (sore throat, sores in mouth, or a toothache) UNUSUAL RASH, SWELLING OR PAIN  UNUSUAL VAGINAL DISCHARGE OR ITCHING   Items with * indicate a potential emergency and should be followed up as soon as possible or go to the Emergency Department if any problems should occur.  Please show the CHEMOTHERAPY ALERT CARD or IMMUNOTHERAPY ALERT CARD at check-in to  the Emergency Department and triage nurse. Should you have questions after your visit or need to cancel or reschedule your appointment, please contact [facility]  [phone] and follow the prompts.  Office hours are 8:00 a.m. to 4:30 p.m. Monday - Friday. Please note that voicemails left after 4:00 p.m. may not be returned until the following business day.  We are closed weekends and major holidays. You have access to a nurse at all times for urgent questions. Please call the main number to the clinic [phone] and follow the prompts.  For any non-urgent questions, you may also contact your provider using MyChart. We now offer e-Visits for anyone 53 and older to request care online for non-urgent symptoms. For details visit [email or web address].   Also download the MyChart app! Go to the app store, search "MyChart", open the app, select [facility], and log in with your MyChart username and password.  Due to Covid, a mask is required upon entering the hospital/clinic. If you do not have a mask, one will be given to you upon arrival. For doctor visits, patients may have 1 support person aged 42 or older with them. For treatment visits, patients cannot have anyone with them due to current Covid guidelines and our immunocompromised population.

## 2021-06-30 NOTE — Patient Instructions (Signed)
Implanted Port Home Guide An implanted port is a device that is placed under the skin. It is usually placed in the chest. The device can be used to give IV medicine, to take blood, or for dialysis. You may have an implanted port if: You need IV medicine that would be irritating to the small veins in your hands or arms. You need IV medicines, such as antibiotics, for a long period of time. You need IV nutrition for a long period of time. You need dialysis. When you have a port, your health care provider can choose to use the port instead of veins in your arms for these procedures. You may have fewer limitations when using a port than you would if you used other types of long-term IVs, and you will likely be able to return to normal activities after your incision heals. An implanted port has two main parts: Reservoir. The reservoir is the part where a needle is inserted to give medicines or draw blood. The reservoir is round. After it is placed, it appears as a small, raised area under your skin. Catheter. The catheter is a thin, flexible tube that connects the reservoir to a vein. Medicine that is inserted into the reservoir goes into the catheter and then into the vein. How is my port accessed? To access your port: A numbing cream may be placed on the skin over the port site. Your health care provider will put on a mask and sterile gloves. The skin over your port will be cleaned carefully with a germ-killing soap and allowed to dry. Your health care provider will gently pinch the port and insert a needle into it. Your health care provider will check for a blood return to make sure the port is in the vein and is not clogged. If your port needs to remain accessed to get medicine continuously (constant infusion), your health care provider will place a clear bandage (dressing) over the needle site. The dressing and needle will need to be changed every week, or as told by your health care provider. What  is flushing? Flushing helps keep the port from getting clogged. Follow instructions from your health care provider about how and when to flush the port. Ports are usually flushed with saline solution or a medicine called heparin. The need for flushing will depend on how the port is used: If the port is only used from time to time to give medicines or draw blood, the port may need to be flushed: Before and after medicines have been given. Before and after blood has been drawn. As part of routine maintenance. Flushing may be recommended every 4-6 weeks. If a constant infusion is running, the port may not need to be flushed. Throw away any syringes in a disposal container that is meant for sharp items (sharps container). You can buy a sharps container from a pharmacy, or you can make one by using an empty hard plastic bottle with a cover. How long will my port stay implanted? The port can stay in for as long as your health care provider thinks it is needed. When it is time for the port to come out, a surgery will be done to remove it. The surgery will be similar to the procedure that was done to put the port in. Follow these instructions at home:  Flush your port as told by your health care provider. If you need an infusion over several days, follow instructions from your health care provider about how   to take care of your port site. Make sure you: Wash your hands with soap and water before you change your dressing. If soap and water are not available, use alcohol-based hand sanitizer. Change your dressing as told by your health care provider. Place any used dressings or infusion bags into a plastic bag. Throw that bag in the trash. Keep the dressing that covers the needle clean and dry. Do not get it wet. Do not use scissors or sharp objects near the tube. Keep the tube clamped, unless it is being used. Check your port site every day for signs of infection. Check for: Redness, swelling, or  pain. Fluid or blood. Pus or a bad smell. Protect the skin around the port site. Avoid wearing bra straps that rub or irritate the site. Protect the skin around your port from seat belts. Place a soft pad over your chest if needed. Bathe or shower as told by your health care provider. The site may get wet as long as you are not actively receiving an infusion. Return to your normal activities as told by your health care provider. Ask your health care provider what activities are safe for you. Carry a medical alert card or wear a medical alert bracelet at all times. This will let health care providers know that you have an implanted port in case of an emergency. Get help right away if: You have redness, swelling, or pain at the port site. You have fluid or blood coming from your port site. You have pus or a bad smell coming from the port site. You have a fever. Summary Implanted ports are usually placed in the chest for long-term IV access. Follow instructions from your health care provider about flushing the port and changing bandages (dressings). Take care of the area around your port by avoiding clothing that puts pressure on the area, and by watching for signs of infection. Protect the skin around your port from seat belts. Place a soft pad over your chest if needed. Get help right away if you have a fever or you have redness, swelling, pain, drainage, or a bad smell at the port site. This information is not intended to replace advice given to you by your health care provider. Make sure you discuss any questions you have with your health care provider. Document Revised: 11/25/2020 Document Reviewed: 01/20/2020 Elsevier Patient Education  2022 [employer]  

## 2021-06-30 NOTE — Telephone Encounter (Signed)
Received call from Elmo Putt at [facility] requesting liquid biopsy results. Printed and faxed to requested number [phone]. Confirmed fax was sent.

## 2021-07-01 ENCOUNTER — Encounter: Payer: Self-pay | Admitting: Hematology & Oncology

## 2021-07-01 LAB — CA 125: Cancer Antigen (CA) 125: 2057 U/mL — ABNORMAL HIGH (ref 0.0–38.1)

## 2021-07-02 ENCOUNTER — Inpatient Hospital Stay: Payer: 59

## 2021-07-02 ENCOUNTER — Encounter: Payer: Self-pay | Admitting: *Deleted

## 2021-07-02 ENCOUNTER — Other Ambulatory Visit: Payer: Self-pay | Admitting: Pharmacist

## 2021-07-02 DIAGNOSIS — C252 Malignant neoplasm of tail of pancreas: Secondary | ICD-10-CM

## 2021-07-02 NOTE — Progress Notes (Signed)
Patient was seen at [facility] and their recommendation is 5FU/Onivyde. This is a regimen that Dr Marin Olp had previously ordered, but was unable to get approved. He will reorder the regimen to see if approval can be obtained. Notified insurance specialist that a treatment plan would be reordered and to re attempt authorization. Request to make the request urgent made.   Family is also interested in some assistance with nutrition. We will place a referral to the [facility]. There was a question about enzymes and an offer of Creon was made, however the family stated they already had some at home. She previously didn't tolerate the Creon, but they will try again.   Oncology Nurse Navigator Documentation  Oncology Nurse Navigator Flowsheets 07/02/2021  Confirmed Diagnosis Date -  Diagnosis Status -  Planned Course of Treatment -  Phase of Treatment -  Chemotherapy Pending- Reason: -  Chemotherapy Actual Start Date: -  Radiation Actual Start Date: -  Navigator Follow Up Date: 07/09/2021  Navigator Follow Up Reason: Follow-up Appointment  Navigator Location [facility]  Referral Date to RadOnc/MedOnc -  Navigator Encounter Type MyChart;Education;Appt/Treatment Plan Review  Telephone -  Treatment Initiated Date -  Patient Visit Type MedOnc  Treatment Phase Active Tx  Barriers/Navigation Needs Coordination of Care;Cultural Needs;Family Concerns;Language/Communication;Education  Education Other  Interventions Coordination of Care;Education;Medication Assistance;Psycho-Social Support;Referrals  Acuity Level 2-Minimal Needs (1-2 Barriers Identified)  Referrals Nutrition/dietician  Coordination of Care Other  Education Method Written  Support Groups/Services Friends and Family  Time Spent with Patient 32

## 2021-07-05 ENCOUNTER — Inpatient Hospital Stay: Payer: 59

## 2021-07-05 ENCOUNTER — Inpatient Hospital Stay (HOSPITAL_BASED_OUTPATIENT_CLINIC_OR_DEPARTMENT_OTHER): Payer: 59 | Admitting: Hematology & Oncology

## 2021-07-05 ENCOUNTER — Encounter: Payer: Self-pay | Admitting: Hematology & Oncology

## 2021-07-05 ENCOUNTER — Encounter: Payer: Self-pay | Admitting: *Deleted

## 2021-07-05 ENCOUNTER — Other Ambulatory Visit (HOSPITAL_BASED_OUTPATIENT_CLINIC_OR_DEPARTMENT_OTHER): Payer: Self-pay

## 2021-07-05 ENCOUNTER — Other Ambulatory Visit: Payer: Self-pay

## 2021-07-05 ENCOUNTER — Telehealth: Payer: Self-pay | Admitting: Hematology & Oncology

## 2021-07-05 VITALS — BP 139/92 | HR 116 | Temp 98.4°F | Resp 20

## 2021-07-05 DIAGNOSIS — C251 Malignant neoplasm of body of pancreas: Secondary | ICD-10-CM | POA: Diagnosis not present

## 2021-07-05 DIAGNOSIS — C252 Malignant neoplasm of tail of pancreas: Secondary | ICD-10-CM

## 2021-07-05 DIAGNOSIS — Z5111 Encounter for antineoplastic chemotherapy: Secondary | ICD-10-CM | POA: Diagnosis not present

## 2021-07-05 MED ORDER — HYDROMORPHONE HCL 4 MG/ML IJ SOLN
4.0000 mg | Freq: Once | INTRAMUSCULAR | Status: AC
  Administered 2021-07-05: 4 mg via INTRAVENOUS
  Filled 2021-07-05: qty 1

## 2021-07-05 MED ORDER — MORPHINE SULFATE (CONCENTRATE) 10 MG /0.5 ML PO SOLN: 20.0000 mg | ORAL | 0 refills | Status: DC | PRN

## 2021-07-05 MED ORDER — MORPHINE SULFATE 20 MG RE SUPP
20.0000 mg | RECTAL | 0 refills | Status: DC | PRN
  Filled 2021-07-05: qty 30, 5d supply, fill #0

## 2021-07-05 MED ORDER — SODIUM CHLORIDE 0.9 % IV SOLN: Freq: Once | INTRAVENOUS | Status: AC

## 2021-07-05 MED ORDER — SODIUM CHLORIDE 0.9 % IV SOLN
1000.0000 mL | INTRAVENOUS | Status: AC
  Administered 2021-07-05: 1000 mL via INTRAVENOUS

## 2021-07-05 MED ORDER — LORAZEPAM 2 MG/ML IJ SOLN
1.0000 mg | Freq: Once | INTRAMUSCULAR | Status: AC
  Administered 2021-07-05: 1 mg via INTRAVENOUS
  Filled 2021-07-05: qty 1

## 2021-07-05 MED ORDER — PROCHLORPERAZINE 25 MG RE SUPP
25.0000 mg | Freq: Three times a day (TID) | RECTAL | 2 refills | Status: AC | PRN
  Filled 2021-07-05: qty 30, 10d supply, fill #0

## 2021-07-05 MED ORDER — HEPARIN SOD (PORK) LOCK FLUSH 100 UNIT/ML IV SOLN
500.0000 [IU] | Freq: Once | INTRAVENOUS | Status: AC
  Administered 2021-07-05: 500 [IU] via INTRAVENOUS

## 2021-07-05 MED ORDER — SODIUM CHLORIDE 0.9% FLUSH
10.0000 mL | Freq: Once | INTRAVENOUS | Status: AC
  Administered 2021-07-05: 10 mL via INTRAVENOUS

## 2021-07-05 MED ORDER — FENTANYL 75 MCG/HR TD PT72
2.0000 | MEDICATED_PATCH | TRANSDERMAL | 0 refills | Status: AC
  Filled 2021-07-05: qty 5, 5d supply, fill #0
  Filled 2021-07-09: qty 25, 25d supply, fill #0

## 2021-07-05 NOTE — Progress Notes (Signed)
Hematology and Oncology Follow Up Visit  Angela Wilson [id number] Aug 23, 1966 55 y.o. 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 is here today with her son-in-law.  Unfortunately, the decline continues.  I am really not surprised by this.  Her prealbumin is only less than 5.  I think this is incredibly predictive.  I told her son-in-law that I really did not think that she was going to make it through November.  She saw Dr. Fanny Skates at [facility] for consultation.  As always, Dr. Fanny Skates was very helpful.  It looks like she recommended 5-FU/liposomal irinotecan.  I know we have tried to get her on liposomal irinotecan before.  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  Maybe they will allow [place] to use this time.  Her main problem continues to be pain.  I do not know how else we can try to manage this.  I had believe this is all from her malignancy.  She did have radiation therapy.  I suppose this might be contributing to the discomfort.  It might be contributing to the nausea.  It might be contributing to the diarrhea.  She is on a fentanyl patch at 75 mcg every 2 days.  I told her to double the the patch to 150 mcg every 2 days.  We will try her on Roxanol elixir (20 mg under the tongue  every 3 hours as needed) for any kind of breakthrough pain.  We will try her on a Compazine suppository for vomiting.  It is hard to say what she is actually taking at home.  I just feel bad that she is now doing well.  We really have tried as much as I can think of to help her.  I know her family is not willing to give up yet.  However, I would think that we are headed toward Hospice soon.  I would hate to see [name] admitted and then have things done that artificially will keep her alive.  I know that she would like to be back in [place].  I do not know if we will ever be able to get her back there.  Her last tumor markers just a week or so ago showed a CA-125 was 2057 and her CEA was 80.  Currently, I would say her performance status is probably ECOG 3.     Medications:  Allergies as of 07/05/2021       Reactions   Ivp Dye [iodinated Diagnostic Agents] Itching, Rash   Morphine And Related Hives   Penicillins Rash        Medication List        Accurate as of July 05, 2021  5:17 PM. If you have any questions, ask your nurse  or doctor.          albuterol 108 (90 Base) MCG/ACT inhaler Commonly known as: VENTOLIN HFA Inhale 2 puffs into the lungs every 4 (four) hours as needed for wheezing or shortness of breath.   amLODipine 5 MG tablet Commonly known as: NORVASC Take 5 mg by mouth daily.   cetirizine 10 MG tablet Commonly known as: ZYRTEC Take 1 tablet (10 mg total) by mouth daily.   dexamethasone 4 MG tablet Commonly known as: DECADRON Take 1 tablet (4 mg total) by mouth 2 (two) times daily with a meal. For three (3) days following chemotherapy.   dicyclomine 10 MG capsule Commonly known as: BENTYL Take 10 mg by mouth 4 (four) times daily.   diphenoxylate-atropine 2.5-0.025 MG tablet Commonly known as: Lomotil Take 2 tablets by mouth 4 (four) times daily as needed for diarrhea or loose stools.   dronabinol 2.5 MG capsule Commonly known as:  MARINOL Take 1 capsule (2.5 mg total) by mouth 2 (two) times daily before lunch and supper.   ergocalciferol 1.25 MG (50000 UT) capsule Commonly known as: VITAMIN D2 Take 1 capsule (50,000 Units total) by mouth once a week.   fentaNYL 75 MCG/HR Commonly known as: [place] 2 patches onto the skin every other day. What changed: how much to take Changed by: Volanda Napoleon, MD   gabapentin 300 MG capsule Commonly known as: NEURONTIN TAKE 2 CAPSULES BY MOUTH IN THE DAYTIME AND 2 CAPSULES AT BEDTIME   Hycodan 5-1.5 MG/5ML syrup Generic drug: HYDROcodone bit-homatropine Take 5 mLs by mouth every 6 (six) hours as needed for cough.   HYDROmorphone 4 MG tablet Commonly known as: Dilaudid Take 1 tablet (4 mg total) by mouth every 4 (four) hours as needed for severe pain.   Lactulose 20 GM/30ML Soln Take 30 mLs (20 g total) by mouth 3 (three) times daily as needed.   lidocaine-prilocaine cream Commonly known as: EMLA APPLY TO PORT 1 HOUR BEFORE ACCESS.   lisinopril 10 MG tablet Commonly known as: ZESTRIL Take by mouth daily.   LORazepam 0.5 MG tablet Commonly known as: ATIVAN Take 0.5 mg every six hours as needed for nausea.   morphine CONCENTRATE 10 mg / 0.5 ml concentrated solution Take 1 mL (20 mg total) by mouth every 3 (three) hours as needed for severe pain. Started by: Tildon Husky, RN   OLANZapine 10 MG tablet Commonly known as: ZYPREXA Take 1 tablet (10 mg total) by mouth at bedtime. Take every day for nausea/vomiting   omeprazole 40 MG capsule Commonly known as: PRILOSEC Take 40 mg by mouth daily as needed (acid reflux).   ondansetron 4 MG tablet Commonly known as: ZOFRAN Take 1 tablet (4 mg total) by mouth every 6 (six) hours as needed for nausea.   polyethylene glycol 17 g packet Commonly known as: MIRALAX / GLYCOLAX Take 17 g by mouth daily as needed.   prochlorperazine 10 MG tablet Commonly known as: COMPAZINE Take 1 tablet (10 mg total) by  mouth every 6 (six) hours as needed for nausea or vomiting.   prochlorperazine 25 MG suppository Commonly known as: COMPAZINE Unwrap and place 1 suppository (25 mg total) rectally every 8 (eight) hours as needed for nausea or vomiting. Started by: Volanda Napoleon, MD   rivaroxaban 20 MG Tabs tablet Commonly known as: XARELTO Take 1 tablet (20 mg total) by mouth daily with supper.        Allergies:  Allergies  Allergen Reactions  Ivp Dye [Iodinated Diagnostic Agents] Itching and Rash   Morphine And Related Hives   Penicillins Rash    Past Medical History, Surgical history, Social history, and Family History were reviewed and updated.  Review of Systems: Review of Systems  Constitutional:  Positive for malaise/fatigue.  HENT: Negative.    Eyes: Negative.   Respiratory: Negative.    Cardiovascular: Negative.   Gastrointestinal:  Positive for abdominal pain.  Genitourinary: Negative.   Musculoskeletal:  Positive for myalgias.  Skin: Negative.   Neurological: Negative.   Endo/Heme/Allergies: Negative.   Psychiatric/Behavioral: Negative.      Physical Exam:  vitals were not taken for this visit.   Wt Readings from Last 3 Encounters:  06/30/21 207 lb (93.9 kg)  06/23/21 203 lb (92.1 kg)  06/02/21 206 lb (93.4 kg)   Physical Exam Vitals reviewed.  HENT:     Head: Normocephalic and atraumatic.  Eyes:     Pupils: Pupils are equal, round, and reactive to light.  Cardiovascular:     Rate and Rhythm: Normal rate and regular rhythm.     Heart sounds: Normal heart sounds.  Pulmonary:     Effort: Pulmonary effort is normal.     Breath sounds: Normal breath sounds.  Abdominal:     General: Bowel sounds are normal.     Palpations: Abdomen is soft.  Musculoskeletal:        General: No tenderness or deformity. Normal range of motion.     Cervical back: Normal range of motion.  Lymphadenopathy:     Cervical: No cervical adenopathy.  Skin:    General: Skin is warm and  dry.     Findings: No erythema or rash.  Neurological:     Mental Status: She is alert and oriented to person, place, and time.  Psychiatric:        Behavior: Behavior normal.        Thought Content: Thought content normal.        Judgment: Judgment normal.     Lab Results  Component Value Date   WBC 7.4 06/30/2021   HGB 11.7 (L) 06/30/2021   HCT 36.5 06/30/2021   MCV 85.7 06/30/2021   PLT 201 06/30/2021   Lab Results  Component Value Date   FERRITIN 284 06/15/2020   IRON 55 06/15/2020   TIBC 323 06/15/2020   UIBC 268 06/15/2020   IRONPCTSAT 17 (L) 06/15/2020   Lab Results  Component Value Date   RETICCTPCT 5.7 (H) 06/15/2020   RBC 4.26 06/30/2021   No results found for: KPAFRELGTCHN, LAMBDASER, KAPLAMBRATIO No results found for: IGGSERUM, IGA, IGMSERUM No results found for: TOTALPROTELP, ALBUMINELP, A1GS, A2GS, Arnaldo Natal, GAMS, MSPIKE, SPEI   Chemistry      Component Value Date/Time   NA 128 (L) 06/30/2021 1035   NA 136 01/07/2020 1256   K 4.0 06/30/2021 1035   CL 95 (L) 06/30/2021 1035   CO2 26 06/30/2021 1035   BUN 8 06/30/2021 1035   BUN 10 01/07/2020 1256   CREATININE 0.41 (L) 06/30/2021 1035      Component Value Date/Time   CALCIUM 8.6 (L) 06/30/2021 1035   ALKPHOS 56 06/30/2021 1035   AST 12 (L) 06/30/2021 1035   ALT 6 06/30/2021 1035   BILITOT 0.4 06/30/2021 1035       Impression and Plan: [name] is a very pleasant 55 yo [name] female with metastatic pancreatic cancer.   My goal at this point is to make sure that she has  some decent quality of life.  Pain control is critical.  She is on a fentanyl patch.  Again, we will double the fentanyl patch.  We will try her on a Roxanol liquid.  I am sure this will help.  Maybe, we will get the 5-FU/liposomal irinotecan protocol started this week.  I am unsure if we we will be able to.  This will all depend on insurance.  Again, it would not surprise me at all if she ends up in the hospital.   I just think that her decline is going to be relatively quick.  Again, with a prealbumin less than 5, there is not much that she is can be able to tolerate.  I know this is very difficult for the family.  I know they have been incredibly supportive.  They have tried everything possible to try to help Ms.Cespedes.  We will keep her appointment as scheduled.     Volanda Napoleon, MD 10/17/20225:18 PM

## 2021-07-05 NOTE — Telephone Encounter (Signed)
Scheduled appt per 10/14 referral. Pt's daughter is aware of appt date and time. She is also aware this will be a phone consultation.

## 2021-07-06 ENCOUNTER — Ambulatory Visit: Payer: 59 | Admitting: Hematology & Oncology

## 2021-07-06 ENCOUNTER — Encounter: Payer: Self-pay | Admitting: Hematology & Oncology

## 2021-07-06 ENCOUNTER — Inpatient Hospital Stay: Payer: 59 | Attending: Hematology & Oncology

## 2021-07-06 ENCOUNTER — Other Ambulatory Visit (HOSPITAL_BASED_OUTPATIENT_CLINIC_OR_DEPARTMENT_OTHER): Payer: Self-pay

## 2021-07-06 ENCOUNTER — Encounter: Payer: Self-pay | Admitting: *Deleted

## 2021-07-06 DIAGNOSIS — C252 Malignant neoplasm of tail of pancreas: Secondary | ICD-10-CM | POA: Insufficient documentation

## 2021-07-06 DIAGNOSIS — Z5111 Encounter for antineoplastic chemotherapy: Secondary | ICD-10-CM | POA: Insufficient documentation

## 2021-07-06 NOTE — Progress Notes (Signed)
See MyChart communication and Letters created on 07/06/2021  Oncology Nurse Navigator Documentation  Oncology Nurse Navigator Flowsheets 07/06/2021  Confirmed Diagnosis Date -  Diagnosis Status -  Planned Course of Treatment -  Phase of Treatment -  Chemotherapy Pending- Reason: -  Chemotherapy Actual Start Date: -  Radiation Actual Start Date: -  Navigator Follow Up Date: 07/09/2021  Navigator Follow Up Reason: Follow-up Appointment  Navigator Location [facility]  Referral Date to RadOnc/MedOnc -  Navigator Encounter Type MyChart;Letter/Fax/Email  Telephone -  Treatment Initiated Date -  Patient Visit Type MedOnc  Treatment Phase Active Tx  Barriers/Navigation Needs Coordination of Care;Cultural Needs;Family Concerns;Language/Communication;Education  Education Other  Interventions Other  Acuity Level 2-Minimal Needs (1-2 Barriers Identified)  Referrals -  Coordination of Care Other  Education Method Written  Support Groups/Services Friends and Family  Time Spent with Patient 78

## 2021-07-06 NOTE — Progress Notes (Signed)
Nutrition Assessment   Reason for Assessment:  Poor appetite, nausea   ASSESSMENT:  55 year old female with metastatic adenocarcinoma of pancreas.  Patient was on chemotherapy until 9/15 with progression and started radiation.  Hoping to start 5-FU/liposomal irintoecan if approved by insurance.    Spoke with daughter Rehab via phone.  Daughter reports that patient diarrhea has improved and nausea is a little bit better.  Noted compazine changed to suppository yesterday and pain medication adjusted.  Daughter reports that she is drinking glucerna shake (180 calories and 15 g protein), likes the chocolate.  Also eating yogurt, some vegetables, fish, oatmeal all in small amounts. Daughter said that she has been receiving IV fluids 1 time a week, planning to go back on Friday for more fluids this week.    Medications: zofran, compazine, zyprexa, marinol   Labs: glucose 196   Anthropometrics:   Height: 66 inches Weight: 207 lb Per chart stable weight BMI: 33   NUTRITION DIAGNOSIS: Inadequate oral intake related to cancer and cancer treatment side effects as evidenced by poor po intake   INTERVENTION:  Encouraged medication for nausea to help control symptoms and utilizing IV hydration in clinic.  Discussed small frequent meals, bland foods with nausea.  Email handout to daughter Encouraged glucerna shake multiple per day if patient willing to drink, 3+.   Reviewed foods high in protein with daughter and encouraged frequently during the day.  Contact information given   MONITORING, EVALUATION, GOAL: weight trend, intake   Next Visit: as needed  Demaya Hardge B. Zenia Resides, [place], [place] Registered Dietitian [phone] (mobile)

## 2021-07-07 ENCOUNTER — Other Ambulatory Visit (HOSPITAL_BASED_OUTPATIENT_CLINIC_OR_DEPARTMENT_OTHER): Payer: Self-pay

## 2021-07-08 ENCOUNTER — Other Ambulatory Visit: Payer: Self-pay | Admitting: *Deleted

## 2021-07-08 DIAGNOSIS — K8689 Other specified diseases of pancreas: Secondary | ICD-10-CM

## 2021-07-08 DIAGNOSIS — Z95828 Presence of other vascular implants and grafts: Secondary | ICD-10-CM

## 2021-07-09 ENCOUNTER — Other Ambulatory Visit: Payer: Self-pay

## 2021-07-09 ENCOUNTER — Inpatient Hospital Stay (HOSPITAL_BASED_OUTPATIENT_CLINIC_OR_DEPARTMENT_OTHER): Payer: 59 | Admitting: Hematology & Oncology

## 2021-07-09 ENCOUNTER — Ambulatory Visit (HOSPITAL_COMMUNITY)
Admission: RE | Admit: 2021-07-09 | Discharge: 2021-07-09 | Disposition: A | Discharge status: Home / Self Care | Payer: 59 | Source: Ambulatory Visit | Attending: Hematology & Oncology | Admitting: Hematology & Oncology

## 2021-07-09 ENCOUNTER — Encounter: Payer: Self-pay | Admitting: Hematology & Oncology

## 2021-07-09 ENCOUNTER — Inpatient Hospital Stay: Payer: 59

## 2021-07-09 ENCOUNTER — Other Ambulatory Visit: Payer: Self-pay | Admitting: *Deleted

## 2021-07-09 ENCOUNTER — Other Ambulatory Visit (HOSPITAL_BASED_OUTPATIENT_CLINIC_OR_DEPARTMENT_OTHER): Payer: Self-pay

## 2021-07-09 ENCOUNTER — Encounter: Payer: Self-pay | Admitting: *Deleted

## 2021-07-09 VITALS — BP 135/81 | HR 132 | Temp 98.6°F | Resp 20 | Wt 222.0 lb

## 2021-07-09 DIAGNOSIS — C252 Malignant neoplasm of tail of pancreas: Secondary | ICD-10-CM | POA: Insufficient documentation

## 2021-07-09 DIAGNOSIS — K8689 Other specified diseases of pancreas: Secondary | ICD-10-CM

## 2021-07-09 DIAGNOSIS — Z95828 Presence of other vascular implants and grafts: Secondary | ICD-10-CM

## 2021-07-09 LAB — CBC WITH DIFFERENTIAL (CANCER CENTER ONLY)
Abs Immature Granulocytes: 0.12 10*3/uL — ABNORMAL HIGH (ref 0.00–0.07)
Basophils Absolute: 0 10*3/uL (ref 0.0–0.1)
Basophils Relative: 0 %
Eosinophils Absolute: 0 10*3/uL (ref 0.0–0.5)
Eosinophils Relative: 1 %
HCT: 37.2 % (ref 36.0–46.0)
Hemoglobin: 11.8 g/dL — ABNORMAL LOW (ref 12.0–15.0)
Immature Granulocytes: 1 %
Lymphocytes Relative: 22 %
Lymphs Abs: 1.9 10*3/uL (ref 0.7–4.0)
MCH: 26.8 pg (ref 26.0–34.0)
MCHC: 31.7 g/dL (ref 30.0–36.0)
MCV: 84.4 fL (ref 80.0–100.0)
Monocytes Absolute: 0.6 10*3/uL (ref 0.1–1.0)
Monocytes Relative: 6 %
Neutro Abs: 6.1 10*3/uL (ref 1.7–7.7)
Neutrophils Relative %: 70 %
Platelet Count: 237 10*3/uL (ref 150–400)
RBC: 4.41 MIL/uL (ref 3.87–5.11)
RDW: 17 % — ABNORMAL HIGH (ref 11.5–15.5)
WBC Count: 8.7 10*3/uL (ref 4.0–10.5)
nRBC: 0 % (ref 0.0–0.2)

## 2021-07-09 LAB — CMP (CANCER CENTER ONLY)
ALT: 8 U/L (ref 0–44)
AST: 14 U/L — ABNORMAL LOW (ref 15–41)
Albumin: 2.6 g/dL — ABNORMAL LOW (ref 3.5–5.0)
Alkaline Phosphatase: 65 U/L (ref 38–126)
Anion gap: 7 (ref 5–15)
BUN: 9 mg/dL (ref 6–20)
CO2: 25 mmol/L (ref 22–32)
Calcium: 8.7 mg/dL — ABNORMAL LOW (ref 8.9–10.3)
Chloride: 94 mmol/L — ABNORMAL LOW (ref 98–111)
Creatinine: 0.43 mg/dL — ABNORMAL LOW (ref 0.44–1.00)
GFR, Estimated: >60 mL/min (ref >60)
Glucose, Bld: 178 mg/dL — ABNORMAL HIGH (ref 70–99)
Potassium: 4.4 mmol/L (ref 3.5–5.1)
Sodium: 126 mmol/L — ABNORMAL LOW (ref 135–145)
Total Bilirubin: 0.5 mg/dL (ref 0.3–1.2)
Total Protein: 6.5 g/dL (ref 6.5–8.1)

## 2021-07-09 MED ORDER — HYDROMORPHONE HCL 4 MG/ML IJ SOLN: 4.0000 mg | Freq: Once | INTRAMUSCULAR | Status: DC

## 2021-07-09 MED ORDER — MORPHINE SULFATE (CONCENTRATE) 20 MG/ML PO SOLN
20.0000 mg | ORAL | 0 refills | Status: AC | PRN
  Filled 2021-07-09: qty 120, 15d supply, fill #0

## 2021-07-09 MED ORDER — SODIUM CHLORIDE 0.9% FLUSH
10.0000 mL | INTRAVENOUS | Status: DC | PRN
  Administered 2021-07-09: 10 mL via INTRAVENOUS

## 2021-07-09 MED ORDER — LIDOCAINE HCL 1 % IJ SOLN
INTRAMUSCULAR | Status: AC
  Administered 2021-07-09: 5 mL
  Filled 2021-07-09: qty 20

## 2021-07-09 MED ORDER — HYDROMORPHONE HCL 4 MG/ML IJ SOLN
4.0000 mg | Freq: Once | INTRAMUSCULAR | Status: AC
Start: 2021-07-09 — End: 2021-07-09
  Administered 2021-07-09: 4 mg via INTRAVENOUS
  Filled 2021-07-09: qty 1

## 2021-07-09 MED ORDER — SODIUM CHLORIDE 0.9 % IV SOLN
150.0000 mg | Freq: Once | INTRAVENOUS | Status: AC
  Administered 2021-07-09: 150 mg via INTRAVENOUS
  Filled 2021-07-09: qty 150

## 2021-07-09 MED ORDER — HEPARIN SOD (PORK) LOCK FLUSH 100 UNIT/ML IV SOLN
500.0000 [IU] | Freq: Once | INTRAVENOUS | Status: AC
  Administered 2021-07-09: 500 [IU] via INTRAVENOUS

## 2021-07-09 MED ORDER — SODIUM CHLORIDE 0.9 % IV SOLN: Freq: Once | INTRAVENOUS | Status: DC

## 2021-07-09 MED ORDER — SODIUM CHLORIDE 0.9 % IV SOLN: Freq: Once | INTRAVENOUS | Status: AC

## 2021-07-09 NOTE — Progress Notes (Signed)
Patient here for symptom management. Her new regimen has been approved and she will start at the discretion of Dr Marin Olp. Today she requires a paracentesis which the desk nurse successfully scheduled for this afternoon.   Plan to start new regimen on 07/13/2021.   Oncology Nurse Navigator Documentation  Oncology Nurse Navigator Flowsheets 07/09/2021  Confirmed Diagnosis Date -  Diagnosis Status -  Planned Course of Treatment -  Phase of Treatment -  Chemotherapy Pending- Reason: -  Chemotherapy Actual Start Date: -  Radiation Actual Start Date: -  Navigator Follow Up Date: 07/28/2021  Navigator Follow Up Reason: Follow-up Appointment  Navigator Location [facility]  Referral Date to RadOnc/MedOnc -  Navigator Encounter Type Treatment;Appt/Treatment Plan Review  Telephone -  Treatment Initiated Date -  Patient Visit Type MedOnc  Treatment Phase Active Tx  Barriers/Navigation Needs Coordination of Care;Cultural Needs;Family Concerns;Language/Communication;Education  Education -  Interventions None Required  Acuity Level 2-Minimal Needs (1-2 Barriers Identified)  Referrals -  Coordination of Care -  Education Method -  Support Groups/Services Friends and Family  Time Spent with Patient 19

## 2021-07-09 NOTE — Patient Instructions (Signed)
Implanted Port Home Guide An implanted port is a device that is placed under the skin. It is usually placed in the chest. The device can be used to give IV medicine, to take blood, or for dialysis. You may have an implanted port if: You need IV medicine that would be irritating to the small veins in your hands or arms. You need IV medicines, such as antibiotics, for a long period of time. You need IV nutrition for a long period of time. You need dialysis. When you have a port, your health care provider can choose to use the port instead of veins in your arms for these procedures. You may have fewer limitations when using a port than you would if you used other types of long-term IVs, and you will likely be able to return to normal activities after your incision heals. An implanted port has two main parts: Reservoir. The reservoir is the part where a needle is inserted to give medicines or draw blood. The reservoir is round. After it is placed, it appears as a small, raised area under your skin. Catheter. The catheter is a thin, flexible tube that connects the reservoir to a vein. Medicine that is inserted into the reservoir goes into the catheter and then into the vein. How is my port accessed? To access your port: A numbing cream may be placed on the skin over the port site. Your health care provider will put on a mask and sterile gloves. The skin over your port will be cleaned carefully with a germ-killing soap and allowed to dry. Your health care provider will gently pinch the port and insert a needle into it. Your health care provider will check for a blood return to make sure the port is in the vein and is not clogged. If your port needs to remain accessed to get medicine continuously (constant infusion), your health care provider will place a clear bandage (dressing) over the needle site. The dressing and needle will need to be changed every week, or as told by your health care provider. What  is flushing? Flushing helps keep the port from getting clogged. Follow instructions from your health care provider about how and when to flush the port. Ports are usually flushed with saline solution or a medicine called heparin. The need for flushing will depend on how the port is used: If the port is only used from time to time to give medicines or draw blood, the port may need to be flushed: Before and after medicines have been given. Before and after blood has been drawn. As part of routine maintenance. Flushing may be recommended every 4-6 weeks. If a constant infusion is running, the port may not need to be flushed. Throw away any syringes in a disposal container that is meant for sharp items (sharps container). You can buy a sharps container from a pharmacy, or you can make one by using an empty hard plastic bottle with a cover. How long will my port stay implanted? The port can stay in for as long as your health care provider thinks it is needed. When it is time for the port to come out, a surgery will be done to remove it. The surgery will be similar to the procedure that was done to put the port in. Follow these instructions at home:  Flush your port as told by your health care provider. If you need an infusion over several days, follow instructions from your health care provider about how   to take care of your port site. Make sure you: Wash your hands with soap and water before you change your dressing. If soap and water are not available, use alcohol-based hand sanitizer. Change your dressing as told by your health care provider. Place any used dressings or infusion bags into a plastic bag. Throw that bag in the trash. Keep the dressing that covers the needle clean and dry. Do not get it wet. Do not use scissors or sharp objects near the tube. Keep the tube clamped, unless it is being used. Check your port site every day for signs of infection. Check for: Redness, swelling, or  pain. Fluid or blood. Pus or a bad smell. Protect the skin around the port site. Avoid wearing bra straps that rub or irritate the site. Protect the skin around your port from seat belts. Place a soft pad over your chest if needed. Bathe or shower as told by your health care provider. The site may get wet as long as you are not actively receiving an infusion. Return to your normal activities as told by your health care provider. Ask your health care provider what activities are safe for you. Carry a medical alert card or wear a medical alert bracelet at all times. This will let health care providers know that you have an implanted port in case of an emergency. Get help right away if: You have redness, swelling, or pain at the port site. You have fluid or blood coming from your port site. You have pus or a bad smell coming from the port site. You have a fever. Summary Implanted ports are usually placed in the chest for long-term IV access. Follow instructions from your health care provider about flushing the port and changing bandages (dressings). Take care of the area around your port by avoiding clothing that puts pressure on the area, and by watching for signs of infection. Protect the skin around your port from seat belts. Place a soft pad over your chest if needed. Get help right away if you have a fever or you have redness, swelling, pain, drainage, or a bad smell at the port site. This information is not intended to replace advice given to you by your health care provider. Make sure you discuss any questions you have with your health care provider. Document Revised: 11/25/2020 Document Reviewed: 01/20/2020 Elsevier Patient Education  2022 [employer]  

## 2021-07-09 NOTE — Procedures (Signed)
PROCEDURE SUMMARY:  Moderate ascites, partially loculated. Successful US guided paracentesis from LLQ.  Yielded 2.8 L of clear yellow fluid.  No immediate complications.  Pt tolerated well.   Specimen was not sent for labs.  EBL < 43mL  Ascencion Dike PA-C 07/09/2021 3:24 PM

## 2021-07-09 NOTE — Progress Notes (Signed)
Hematology and Oncology Follow Up Visit  Angela Wilson [id number] [birth year] 55 y.o. 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 is here today with her daughter.  Again, her decline is no surprise.  She is having more pain.  I just feel bad that she is having this pain.  So far the not been able to get the Duragesic patches or the Roxanol.  She does have the Compazine suppositories which does help with her nausea.  She has more abdominal distention.  I have to believe that she has ascites.  We will try to get her a paracentesis today.  We finally got the approval for chemotherapy protocol.  I really am not sure how much this is going to help her or how much she will be able to tolerate.  I was told that we can start this on 07/13/2021.  I know her daughter is just worried that she is not being treated.  I told her daughter that we cannot treat her since she was being seen elsewhere and that if she was to qualify for a medical trial, they would want her off therapy for about a month.  She has been to [facility].  They do not have any protocol for her.  But they did recommend the 5-FU/liposomal irinotecan protocol.  Again, it is her opinion that I wanted to make sure we try to help mostly.  This is really causing the  most of her problems.  Hopefully, we can help with the pain with the adjustment of her patches in the Roxanol.  Her prealbumin is incredibly low.  I told her son-in-law earlier this week that I thought this was incredibly prognostic and then I thought this was likely indicated that she would not survive more than 6-8 weeks.  [name] wants to go back to [place].  She wants to die in [place].  We will try our best to get her there.  If she can have some type of improvement with treatment, it may be she will be strong enough to fly back.  She is not eating all that much.  She is having some diarrhea.  She has not having vomiting.  Her last tumor markers showed a CA-125 of 2057 and a CEA of 80.4.  Overall, I would say her performance status is ECOG 3.     Medications:  Allergies as of 07/09/2021       Reactions   Ivp Dye [iodinated Diagnostic Agents] Itching, Rash   Morphine And Related Hives   Penicillins Rash        Medication List        Accurate as of July 09, 2021 10:49 AM. If you have any questions, ask your nurse or doctor.          albuterol  108 (90 Base) MCG/ACT inhaler Commonly known as: VENTOLIN HFA Inhale 2 puffs into the lungs every 4 (four) hours as needed for wheezing or shortness of breath.   amLODipine 5 MG tablet Commonly known as: NORVASC Take 5 mg by mouth daily.   cetirizine 10 MG tablet Commonly known as: ZYRTEC Take 1 tablet (10 mg total) by mouth daily.   Compro 25 MG suppository Generic drug: prochlorperazine Unwrap and place 1 suppository (25 mg total) rectally every 8 (eight) hours as needed for nausea or vomiting.   dexamethasone 4 MG tablet Commonly known as: DECADRON Take 1 tablet (4 mg total) by mouth 2 (two) times daily with a meal. For three (3) days following chemotherapy.   dicyclomine 10 MG capsule Commonly known as: BENTYL Take 10 mg by mouth 4 (four) times daily.   diphenoxylate-atropine 2.5-0.025 MG tablet Commonly  known as: Lomotil Take 2 tablets by mouth 4 (four) times daily as needed for diarrhea or loose stools.   dronabinol 2.5 MG capsule Commonly known as: MARINOL Take 1 capsule (2.5 mg total) by mouth 2 (two) times daily before lunch and supper.   ergocalciferol 1.25 MG (50000 UT) capsule Commonly known as: VITAMIN D2 Take 1 capsule (50,000 Units total) by mouth once a week.   fentaNYL 75 MCG/HR Commonly known as: DURAGESIC Place 2 patches onto the skin every other day.   gabapentin 300 MG capsule Commonly known as: NEURONTIN TAKE 2 CAPSULES BY MOUTH IN THE DAYTIME AND 2 CAPSULES AT BEDTIME   Hycodan 5-1.5 MG/5ML syrup Generic drug: HYDROcodone bit-homatropine Take 5 mLs by mouth every 6 (six) hours as needed for cough.   HYDROmorphone 4 MG tablet Commonly known as: Dilaudid Take 1 tablet (4 mg total) by mouth every 4 (four) hours as needed for severe pain.   Lactulose 20 GM/30ML Soln Take 30 mLs (20 g total) by mouth 3 (three) times daily as needed.   lidocaine-prilocaine cream Commonly known as: EMLA APPLY TO PORT 1 HOUR BEFORE ACCESS.   lisinopril 10 MG tablet Commonly known as: ZESTRIL Take by mouth daily.   LORazepam 0.5 MG tablet Commonly known as: ATIVAN Take 0.5 mg every six hours as needed for nausea.   morphine CONCENTRATE 10 mg / 0.5 ml concentrated solution Take 1 mL (20 mg total) by mouth every 3 (three) hours as needed for severe pain.   OLANZapine 10 MG tablet Commonly known as: ZYPREXA Take 1 tablet (10 mg total) by mouth at bedtime. Take every day for nausea/vomiting   omeprazole 40 MG capsule Commonly known as: PRILOSEC Take 40 mg by mouth daily as needed (acid reflux).   ondansetron 4 MG tablet Commonly known as: ZOFRAN Take 1 tablet (4 mg total) by mouth every 6 (six) hours as needed for nausea.   polyethylene glycol 17 g packet Commonly known as: MIRALAX / GLYCOLAX Take 17 g by mouth daily as needed.   prochlorperazine 10 MG  tablet Commonly known as: COMPAZINE Take 1 tablet (10 mg total) by mouth every 6 (six) hours as needed for nausea or vomiting.   rivaroxaban 20 MG Tabs tablet Commonly known as: XARELTO Take 1 tablet (20 mg total) by mouth daily with supper.        Allergies:  Allergies  Allergen Reactions   Ivp Dye [Iodinated Diagnostic Agents] Itching and Rash   Morphine And Related Hives   Penicillins Rash    Past Medical History, Surgical history, Social history, and Family History were reviewed and updated.  Review of Systems: Review of Systems  Constitutional:  Positive for malaise/fatigue.  HENT: Negative.    Eyes: Negative.   Respiratory: Negative.    Cardiovascular: Negative.   Gastrointestinal:  Positive for abdominal pain.  Genitourinary: Negative.   Musculoskeletal:  Positive for myalgias.  Skin: Negative.   Neurological: Negative.   Endo/Heme/Allergies: Negative.   Psychiatric/Behavioral: Negative.      Physical Exam:  weight is 222 lb (100.7 kg). Her oral temperature is 98.6 F (37 C). Her blood pressure is 135/81 and her pulse is 132 (abnormal). Her respiration is 20 and oxygen saturation is 97%.   Wt Readings from Last 3 Encounters:  07/09/21 222 lb (100.7 kg)  06/30/21 207 lb (93.9 kg)  06/23/21 203 lb (92.1 kg)   Physical Exam Vitals reviewed.  HENT:     Head: Normocephalic and atraumatic.  Eyes:     Pupils: Pupils are equal, round, and reactive to light.  Cardiovascular:     Rate and Rhythm: Normal rate and regular rhythm.     Heart sounds: Normal heart sounds.  Pulmonary:     Effort: Pulmonary effort is normal.     Breath sounds: Normal breath sounds.  Abdominal:     General: Bowel sounds are normal.     Palpations: Abdomen is soft.  Musculoskeletal:        General: No tenderness or deformity. Normal range of motion.     Cervical back: Normal range of motion.  Lymphadenopathy:     Cervical: No cervical adenopathy.  Skin:    General: Skin is warm  and dry.     Findings: No erythema or rash.  Neurological:     Mental Status: She is alert and oriented to person, place, and time.  Psychiatric:        Behavior: Behavior normal.        Thought Content: Thought content normal.        Judgment: Judgment normal.     Lab Results  Component Value Date   WBC 8.7 07/09/2021   HGB 11.8 (L) 07/09/2021   HCT 37.2 07/09/2021   MCV 84.4 07/09/2021   PLT 237 07/09/2021   Lab Results  Component Value Date   FERRITIN 284 06/15/2020   IRON 55 06/15/2020   TIBC 323 06/15/2020   UIBC 268 06/15/2020   IRONPCTSAT 17 (L) 06/15/2020   Lab Results  Component Value Date   RETICCTPCT 5.7 (H) 06/15/2020   RBC 4.41 07/09/2021   No results found for: KPAFRELGTCHN, LAMBDASER, KAPLAMBRATIO No results found for: IGGSERUM, IGA, IGMSERUM No results found for: TOTALPROTELP, ALBUMINELP, A1GS, A2GS, Arnaldo Natal, GAMS, MSPIKE, SPEI   Chemistry      Component Value Date/Time   NA 128 (L) 06/30/2021 1035   NA 136 01/07/2020 1256   K 4.0 06/30/2021 1035   CL 95 (L) 06/30/2021 1035   CO2 26 06/30/2021 1035   BUN 8 06/30/2021 1035   BUN 10 01/07/2020 1256   CREATININE 0.41 (L) 06/30/2021 1035      Component Value Date/Time   CALCIUM 8.6 (L) 06/30/2021 1035   ALKPHOS 56 06/30/2021 1035   AST 12 (L) 06/30/2021 1035   ALT 6 06/30/2021 1035   BILITOT 0.4 06/30/2021 1035       Impression and Plan: [name] is a very pleasant 55 yo [name] female with metastatic pancreatic cancer.   We did go ahead and get a paracentesis on her.  I think about two-point liters of fluid were removed.  Hopefully this will make her feel a little bit better.  We will give her the IV fluid that she needs.  She is little dehydrated.  We will plan for treatment next week.  Her family really wants her to have this.  I am just not sure how effective this will be.  We are clearly going to have to dose reduce therapy for her to tolerate it.  Again I just want her  quality of life to be the focus.  Hopefully, we can get her back to [place].  We will, I am sure, be seeing her on a regular basis for symptom management.    I will plan to see her back for her second cycle of treatment.   Volanda Napoleon, MD 10/21/202210:49 AM    Pt accompanied by daughter who interprets for her.

## 2021-07-09 NOTE — Patient Instructions (Signed)
?????? ??? ???????? Dehydration, Adult ?????? ????? ????? ???? ????? ?? ???? ???? ????? ?? ??????? ?? ????? ?? ?????. ????? ??? ????? ???? ???????? ???? ????? ???? ?? ?????? ???? ????????. ???????? ???????? ??? ???????? ??????? ??????? ?? ?????? ????? ??? ???? ????? ????? ?? ???????. ????? ?? ???? ?? ??? ??????? ?? ????? ??? ??????? ???????. ??????? ???? ?? ???? ?????? ?? ??????? ?? ??????. ???? ????? ?????? ??????? ?? ????? ??? ???? ?????. ?? ????? ??? ??????? ?? ???? ??????? ??????? ?????: ??????? ???? ???? ??? ????? ?? ??????? ?????? ??? ??????? ?? ????? ?? ?????? ?? ?????? ??????. ??? ??? ?? ???? ?? ???????? ??????? ?? ???? ?????? ?? ?????? ????? ????? ?????? ?? ??????. ??? ???????? ???????? ??????? ??? ????? ?? ??????. ????? ????? ?????? ??? ???????? ???? ???? ????? ?? ??????? ??????? (????? ?????). ??? ???? ???? ??? ?????. ??? ?????? ??? ?????? ??? ?? ???? ?? ????? ???????. ?? ????? ????? ?????? ???? ????? ??????? ??????? ?? ?????? ??????? ???? ??????: ??????? ???? ??? ????? ?????? (????)? ??? ?????? ?? ????? ????? ?? ??? ?????? ???? ????? ??? ?????. ???? ?? 65 ?????. ??????? ??????. ????? ?? ???? ????? ?? ??? ??????? ??? ?????? ???? ??? ????? ????? ?????? ??? ????? ?? ????? ????? ????? ???????. ?????? ?????? ???? ???? ????? ????? (?????? ??????). ?? ?????? ??? ?????? ?? ???????? ????? ????? ?????? ??????? ????. ?????? ?????? ?? ??????? ?????. ???? ?????? ?? ????. ?????? ?? ??????? ??????? ??? ?????? ?? ??? ??????. ???? ???????. ????? ??????. ?? ???? ????? ???? ?????. ??? ????? ????? ?? ?????? ?? ???????. ????. ?????? ?????? ?????? ?? ?????. ??? ????? ???? ?????? ?? ??????? ?????? ?? ?? ??? ?? ??????. ??? ??? ?? ?? ???? ??? ??? ??? ???? ??? ???? ????. ??? ???? ???? ?? ??? ?? ??? ?? ??? ???. ?????? ?? ???????? ???????? ??? ???? ??????? ??????? ??? ????. ??? ???? ????? ?????? ?? ?? ???? ?? 100 ???? ?? ??????? ?? ????? ??????? ??? ????. ??????? ????? ???: ?????? ?????  ????. ??????? ?????????. ????? ?????? ????????. ?????? ??????. ?????? ??????? ?????? (??????) ?? ????? ?? ????????? ?? ?????. ????? ????? ????? ?????. ??? ?????. ??? ????? ??? ??????? ????? ??? ?????? ?????? ??? ??????? ???? ?????? ?????? ??????. ?? ????? ?? ?????? ?? ???? ???????? ?? ????? ???????. ??? ?????? ??? ??????? ????? ???? ??? ?????? ??? ??? ?????. ??? ??? ??? ??? ?????? ??? ?????. ?? ????? ??? ???? ?????? ??????. ??????? ?????? ???? ?? ????? ??????? ?????? ??????? ?? ????????. ???? ???? ?????? ??????? ?? ??????? ?? ??????. ??? ????? ???: ??? ????? ???? ?? ????????. ??? ????? ??????? ?????? (ORS). ???? ????? ????? ??? ??????? ???? ??????? ???????? ???????? ?? ???? (??????????? ?? ???????) ??? ?????? ???????. ???? ???? ?????? ?????? ?????????? ???????: ????? ????? ???????. ???? ?????? ???? ??????? ??? ??????? ??? ??????. ????? ??? ?????? ?????? ??????? ??? ????? ????? ??? ?? ????? ??? ??? ??? ?????? (????? ???? ????? ?? ????? NG). ?????? ??????? ??????? ???????. ???? ????????? ??????? ?? ??????: ????? ?????? ?????? ??? ???? ???? ????? ??????? ??????? ??? ????? ????? ??????? ?????? ??????? ?? ????: ????? ????? ?????? ?????? ????????? ???????? ??? ??????. ?? ???? ????? ?????? ?????  ??? (120 ??) ?? 5-10 ?????. ??? ????? ???? ??????? ???? ??????? ???? ??? ?????? ??? ?????? ???? ???? ??? ???? ??????? ??????. ????? ??????     ???? ???? ????? ?? ??????? ??????? ?????? ??? ??? ????? ???? ??????. ???? ??? ???? ??????? ?????? ?? ????? ???? ????? ?????? ??????? ????? ???? ??????? ??? ????? ?? ???? ?? ????? ??????? ??????? ?????? ????. ???? ?????? ???: ?????. ?? ???? ????? ???. ???? ???? ?? ???? ??? ??? ???????? ?? ????. ????? ??????? ????? ?????. ???? ??????? ??????? ?????? (???? ????? ???? ??????). ??????? ??????? ?????? ??????? ????????. ????? ??????? ???? ????? ??? ????? ??? ?? ??????? ??? ????? ????????? ???????? ???????? ????????. ?? ?????? ????????? ????????. ???? ??  ???: ????????? ???? ????? ??? ???? ?? ??????. ???? ????? ??????? ?????? ????? ??????? ???????? ????? ??????? ??? ?????? ???????. ????????. ??????? ??????? ?? ???? ????? ??? ?????? ?? ?????? ?? ?????. ??????? ???? ????? ??????? ???? ????? ????? ???? ?? ??? ???? ???? ??? ?? ????? ?? ???? ??????? ??????. ?? ?????? ????? ????????. ???? ???? ?? ???? ??? ?????? ???? ???????? ?? ????? (??? ?????? ????). ???? ?????? ?????? ??????? ??? ??????? ??????? ??????? ??????. ?????? ??????? ???? ??????? ?????? ???? ??????? ?????? ??. ????? ????? ?????? ???????? ??? ??????? ???? ??????? ??????. ???? ??? ???. ???? ????? ??????? ?????? ?? ??????? ???????: ?????? ??????? ????? ?? ??? ?? ???? ???? ???????? ???: ??? ?? ????? ?? ????? ???? ?? ????? ?? ???? ????. ???? ??????. ???? ??? ????. ?????? ??????? ???? ?? ???????. ????? ???????? ?? ????? ?? ????????? ???? ?? ???????. ????? ??????? ?? ?????. ????? ?????? ??????. ???? ???????? ??? ????? ?? ????: ???????? ?? ??? ????? ?????? ??????. ????? ????? ??????? ???: ??? ?????? ??? ?????? ?? ????? ??? ????. ????? ??? ????? ?? ??? ?????. ???? ?? ?? ???? ????? (??????? ???????) ?? ?????. ????? ????? ????? ??? ??????. ??????? ??????. ???? ????. ?????? ?? ??????? ?? ??????? ???: ????? ????? ??? ?? ????? ??? ????. ?? ?? ?????? (??????). ?? ????? ??? ?? ???? ?????? ???? ?????????. ??? ?????? ??? ????? ????? ?? ????? ?????? ???? ?? ??? ?????? ??????? ????  6-8 ?????. ????? ??????. ???? ???? ??? ??????? ????? ????? ????? ???? ???? ?????. ?? ????? ?? ??? ???? ??????? ????? ?? ??. ??? ???? ??? ???????? ?????? ??? ?????. ???? ?????? ??????? ??????? (911 ?? ???????? ??????? ?????????). ?? ??? ??????? ????? ??? ????????. ???? ?????? ????? ????? ???? ????? ?? ???? ???? ????? ?? ??????? ?? ????? ?? ?????. ????? ??? ????? ???? ???????? ???? ????? ???? ?? ?????? ???? ????????. ????? ???? ??? ?????? ??? ??? ?????. ??? ??? ??? ??? ?????? ??? ?????. ?? ????? ??? ???? ??????  ??????. ???? ???? ????? ?? ??????? ??????? ?????? ??? ??? ????? ???? ??????. ??? ??? ???? ??????? ?????? ?? ??? ?? ??? ????? ?????? ??????? ????? ??????? ??? ????? ?? ???? ?? ????? ???????? ??????? ?????? ????. ????? ??????? ???? ????? ????? ???? ?? ??? ???? ???? ??? ?? ????? ?? ???? ??????? ??????. ???? ???????? ??? ????? ??? ???? ???? ????? ?????? ??????. ??? ????? ?? ??? ????????? ?? ???? ?????? ????????? ???? ?????? ???? ??????? ??????. ???? ?? ?????? ??? ????? ???? ?? ???? ?? ???? ??????? ??????.? Document Revised: 05/22/2019 Document Reviewed: 05/22/2019 Elsevier Patient Education  [place].

## 2021-07-12 ENCOUNTER — Ambulatory Visit: Payer: Self-pay | Admitting: Radiation Oncology

## 2021-07-12 ENCOUNTER — Encounter: Payer: Self-pay | Admitting: Hematology & Oncology

## 2021-07-12 ENCOUNTER — Telehealth: Payer: Self-pay | Admitting: *Deleted

## 2021-07-12 ENCOUNTER — Other Ambulatory Visit (HOSPITAL_BASED_OUTPATIENT_CLINIC_OR_DEPARTMENT_OTHER): Payer: Self-pay

## 2021-07-12 NOTE — Progress Notes (Signed)
Pharmacist Chemotherapy Monitoring - Initial Assessment    Anticipated start date: 07/13/21   The following has been reviewed per standard work regarding the patient's treatment regimen: The patient's diagnosis, treatment plan and drug doses, and organ/hematologic function Lab orders and baseline tests specific to treatment regimen  The treatment plan start date, drug sequencing, and pre-medications Prior authorization status  Patient's documented medication list, including drug-drug interaction screen and prescriptions for anti-emetics and supportive care specific to the treatment regimen The drug concentrations, fluid compatibility, administration routes, and timing of the medications to be used The patient's access for treatment and lifetime cumulative dose history, if applicable  The patient's medication allergies and previous infusion related reactions, if applicable   Changes made to treatment plan:  treatment plan date  Follow up needed:  N/A   Eria Lozoya, Jacqlyn Larsen, [facility], 07/12/2021  11:04 AM

## 2021-07-12 NOTE — Telephone Encounter (Signed)
Per 07/09/21 los - called and lvm of  upcoming appointments - requested call back to confirm

## 2021-07-13 ENCOUNTER — Encounter: Payer: Self-pay | Admitting: *Deleted

## 2021-07-13 ENCOUNTER — Inpatient Hospital Stay: Payer: 59

## 2021-07-13 ENCOUNTER — Other Ambulatory Visit: Payer: Self-pay

## 2021-07-13 ENCOUNTER — Other Ambulatory Visit: Payer: Self-pay | Admitting: *Deleted

## 2021-07-13 ENCOUNTER — Other Ambulatory Visit (HOSPITAL_BASED_OUTPATIENT_CLINIC_OR_DEPARTMENT_OTHER): Payer: Self-pay

## 2021-07-13 DIAGNOSIS — Z79899 Other long term (current) drug therapy: Secondary | ICD-10-CM

## 2021-07-13 DIAGNOSIS — R310 Gross hematuria: Secondary | ICD-10-CM | POA: Diagnosis not present

## 2021-07-13 DIAGNOSIS — Z87442 Personal history of urinary calculi: Secondary | ICD-10-CM

## 2021-07-13 DIAGNOSIS — R54 Age-related physical debility: Secondary | ICD-10-CM | POA: Diagnosis present

## 2021-07-13 DIAGNOSIS — Z20822 Contact with and (suspected) exposure to covid-19: Secondary | ICD-10-CM | POA: Diagnosis present

## 2021-07-13 DIAGNOSIS — Z833 Family history of diabetes mellitus: Secondary | ICD-10-CM

## 2021-07-13 DIAGNOSIS — C252 Malignant neoplasm of tail of pancreas: Secondary | ICD-10-CM

## 2021-07-13 DIAGNOSIS — I10 Essential (primary) hypertension: Secondary | ICD-10-CM | POA: Diagnosis present

## 2021-07-13 DIAGNOSIS — J9601 Acute respiratory failure with hypoxia: Secondary | ICD-10-CM | POA: Diagnosis present

## 2021-07-13 DIAGNOSIS — K8689 Other specified diseases of pancreas: Secondary | ICD-10-CM

## 2021-07-13 DIAGNOSIS — Z800 Family history of malignant neoplasm of digestive organs: Secondary | ICD-10-CM

## 2021-07-13 DIAGNOSIS — C259 Malignant neoplasm of pancreas, unspecified: Secondary | ICD-10-CM | POA: Diagnosis present

## 2021-07-13 DIAGNOSIS — C782 Secondary malignant neoplasm of pleura: Secondary | ICD-10-CM | POA: Diagnosis present

## 2021-07-13 DIAGNOSIS — R110 Nausea: Secondary | ICD-10-CM

## 2021-07-13 DIAGNOSIS — Z7401 Bed confinement status: Secondary | ICD-10-CM

## 2021-07-13 DIAGNOSIS — C787 Secondary malignant neoplasm of liver and intrahepatic bile duct: Secondary | ICD-10-CM | POA: Diagnosis present

## 2021-07-13 DIAGNOSIS — Z923 Personal history of irradiation: Secondary | ICD-10-CM

## 2021-07-13 DIAGNOSIS — Z885 Allergy status to narcotic agent status: Secondary | ICD-10-CM

## 2021-07-13 DIAGNOSIS — R578 Other shock: Secondary | ICD-10-CM | POA: Diagnosis not present

## 2021-07-13 DIAGNOSIS — Z7901 Long term (current) use of anticoagulants: Secondary | ICD-10-CM

## 2021-07-13 DIAGNOSIS — Z5111 Encounter for antineoplastic chemotherapy: Secondary | ICD-10-CM | POA: Diagnosis present

## 2021-07-13 DIAGNOSIS — E876 Hypokalemia: Secondary | ICD-10-CM | POA: Diagnosis not present

## 2021-07-13 DIAGNOSIS — E869 Volume depletion, unspecified: Secondary | ICD-10-CM | POA: Diagnosis not present

## 2021-07-13 DIAGNOSIS — Z8249 Family history of ischemic heart disease and other diseases of the circulatory system: Secondary | ICD-10-CM

## 2021-07-13 DIAGNOSIS — R180 Malignant ascites: Secondary | ICD-10-CM | POA: Diagnosis present

## 2021-07-13 DIAGNOSIS — J9811 Atelectasis: Secondary | ICD-10-CM | POA: Diagnosis present

## 2021-07-13 DIAGNOSIS — Z66 Do not resuscitate: Secondary | ICD-10-CM | POA: Diagnosis not present

## 2021-07-13 DIAGNOSIS — I82C21 Chronic embolism and thrombosis of right internal jugular vein: Secondary | ICD-10-CM | POA: Diagnosis present

## 2021-07-13 DIAGNOSIS — E1165 Type 2 diabetes mellitus with hyperglycemia: Secondary | ICD-10-CM | POA: Diagnosis present

## 2021-07-13 DIAGNOSIS — C772 Secondary and unspecified malignant neoplasm of intra-abdominal lymph nodes: Secondary | ICD-10-CM | POA: Diagnosis present

## 2021-07-13 DIAGNOSIS — E871 Hypo-osmolality and hyponatremia: Secondary | ICD-10-CM | POA: Diagnosis present

## 2021-07-13 DIAGNOSIS — E875 Hyperkalemia: Secondary | ICD-10-CM | POA: Diagnosis not present

## 2021-07-13 DIAGNOSIS — R627 Adult failure to thrive: Secondary | ICD-10-CM | POA: Diagnosis present

## 2021-07-13 DIAGNOSIS — G928 Other toxic encephalopathy: Secondary | ICD-10-CM | POA: Diagnosis not present

## 2021-07-13 DIAGNOSIS — D638 Anemia in other chronic diseases classified elsewhere: Secondary | ICD-10-CM | POA: Diagnosis present

## 2021-07-13 DIAGNOSIS — Z539 Procedure and treatment not carried out, unspecified reason: Secondary | ICD-10-CM | POA: Diagnosis not present

## 2021-07-13 DIAGNOSIS — C786 Secondary malignant neoplasm of retroperitoneum and peritoneum: Principal | ICD-10-CM | POA: Diagnosis present

## 2021-07-13 DIAGNOSIS — G893 Neoplasm related pain (acute) (chronic): Secondary | ICD-10-CM | POA: Diagnosis present

## 2021-07-13 DIAGNOSIS — J910 Malignant pleural effusion: Secondary | ICD-10-CM | POA: Diagnosis present

## 2021-07-13 DIAGNOSIS — K56609 Unspecified intestinal obstruction, unspecified as to partial versus complete obstruction: Secondary | ICD-10-CM | POA: Diagnosis present

## 2021-07-13 DIAGNOSIS — Z8619 Personal history of other infectious and parasitic diseases: Secondary | ICD-10-CM

## 2021-07-13 DIAGNOSIS — Z6832 Body mass index (BMI) 32.0-32.9, adult: Secondary | ICD-10-CM

## 2021-07-13 DIAGNOSIS — K567 Ileus, unspecified: Secondary | ICD-10-CM | POA: Diagnosis present

## 2021-07-13 DIAGNOSIS — E43 Unspecified severe protein-calorie malnutrition: Secondary | ICD-10-CM | POA: Diagnosis present

## 2021-07-13 DIAGNOSIS — E785 Hyperlipidemia, unspecified: Secondary | ICD-10-CM | POA: Diagnosis present

## 2021-07-13 DIAGNOSIS — Z91041 Radiographic dye allergy status: Secondary | ICD-10-CM

## 2021-07-13 DIAGNOSIS — E8770 Fluid overload, unspecified: Secondary | ICD-10-CM | POA: Diagnosis present

## 2021-07-13 DIAGNOSIS — Z880 Allergy status to penicillin: Secondary | ICD-10-CM

## 2021-07-13 DIAGNOSIS — K219 Gastro-esophageal reflux disease without esophagitis: Secondary | ICD-10-CM | POA: Diagnosis present

## 2021-07-13 DIAGNOSIS — R34 Anuria and oliguria: Secondary | ICD-10-CM | POA: Diagnosis not present

## 2021-07-13 DIAGNOSIS — K921 Melena: Secondary | ICD-10-CM | POA: Diagnosis not present

## 2021-07-13 LAB — CMP (CANCER CENTER ONLY)
ALT: 11 U/L (ref 0–44)
AST: 15 U/L (ref 15–41)
Albumin: 2.5 g/dL — ABNORMAL LOW (ref 3.5–5.0)
Alkaline Phosphatase: 61 U/L (ref 38–126)
Anion gap: 7 (ref 5–15)
BUN: 9 mg/dL (ref 6–20)
CO2: 28 mmol/L (ref 22–32)
Calcium: 8.7 mg/dL — ABNORMAL LOW (ref 8.9–10.3)
Chloride: 95 mmol/L — ABNORMAL LOW (ref 98–111)
Creatinine: 0.46 mg/dL (ref 0.44–1.00)
GFR, Estimated: >60 mL/min (ref >60)
Glucose, Bld: 179 mg/dL — ABNORMAL HIGH (ref 70–99)
Potassium: 3.9 mmol/L (ref 3.5–5.1)
Sodium: 130 mmol/L — ABNORMAL LOW (ref 135–145)
Total Bilirubin: 0.5 mg/dL (ref 0.3–1.2)
Total Protein: 6.2 g/dL — ABNORMAL LOW (ref 6.5–8.1)

## 2021-07-13 LAB — CBC WITH DIFFERENTIAL (CANCER CENTER ONLY)
Abs Immature Granulocytes: 0.06 10*3/uL (ref 0.00–0.07)
Basophils Absolute: 0 10*3/uL (ref 0.0–0.1)
Basophils Relative: 0 %
Eosinophils Absolute: 0 10*3/uL (ref 0.0–0.5)
Eosinophils Relative: 0 %
HCT: 37.4 % (ref 36.0–46.0)
Hemoglobin: 11.9 g/dL — ABNORMAL LOW (ref 12.0–15.0)
Immature Granulocytes: 1 %
Lymphocytes Relative: 31 %
Lymphs Abs: 2.4 10*3/uL (ref 0.7–4.0)
MCH: 26.6 pg (ref 26.0–34.0)
MCHC: 31.8 g/dL (ref 30.0–36.0)
MCV: 83.7 fL (ref 80.0–100.0)
Monocytes Absolute: 0.7 10*3/uL (ref 0.1–1.0)
Monocytes Relative: 9 %
Neutro Abs: 4.4 10*3/uL (ref 1.7–7.7)
Neutrophils Relative %: 59 %
Platelet Count: 201 10*3/uL (ref 150–400)
RBC: 4.47 MIL/uL (ref 3.87–5.11)
RDW: 17.3 % — ABNORMAL HIGH (ref 11.5–15.5)
WBC Count: 7.5 10*3/uL (ref 4.0–10.5)
nRBC: 0 % (ref 0.0–0.2)

## 2021-07-13 MED ORDER — ONDANSETRON HCL 4 MG PO TABS
4.0000 mg | ORAL_TABLET | Freq: Four times a day (QID) | ORAL | 1 refills | Status: AC | PRN
  Filled 2021-07-13: qty 30, 8d supply, fill #0

## 2021-07-13 MED ORDER — SODIUM CHLORIDE 0.9 % IV SOLN
400.0000 mg/m2 | Freq: Once | INTRAVENOUS | Status: AC
  Administered 2021-07-13: 836 mg via INTRAVENOUS
  Filled 2021-07-13: qty 41.8

## 2021-07-13 MED ORDER — SODIUM CHLORIDE 0.9 % IV SOLN: 8.0000 mg | Freq: Once | INTRAVENOUS | Status: DC

## 2021-07-13 MED ORDER — SODIUM CHLORIDE 0.9 % IV SOLN: Freq: Once | INTRAVENOUS | Status: DC

## 2021-07-13 MED ORDER — PALONOSETRON HCL INJECTION 0.25 MG/5ML
0.2500 mg | Freq: Once | INTRAVENOUS | Status: AC
  Administered 2021-07-13: 0.25 mg via INTRAVENOUS
  Filled 2021-07-13: qty 5

## 2021-07-13 MED ORDER — PALONOSETRON HCL INJECTION 0.25 MG/5ML: 0.2500 mg | Freq: Once | INTRAVENOUS | Status: DC

## 2021-07-13 MED ORDER — SODIUM CHLORIDE 0.9 % IV SOLN
1800.0000 mg/m2 | INTRAVENOUS | Status: DC
  Administered 2021-07-13: 3750 mg via INTRAVENOUS
  Filled 2021-07-13: qty 75

## 2021-07-13 MED ORDER — SODIUM CHLORIDE 0.9 % IV SOLN
8.0000 mg | Freq: Once | INTRAVENOUS | Status: AC
  Filled 2021-07-13: qty 4

## 2021-07-13 MED ORDER — SODIUM CHLORIDE 0.9 % IV SOLN
10.0000 mg | Freq: Once | INTRAVENOUS | Status: AC
  Administered 2021-07-13: 10 mg via INTRAVENOUS
  Filled 2021-07-13: qty 10

## 2021-07-13 MED ORDER — SODIUM CHLORIDE 0.9 % IV SOLN
52.5000 mg/m2 | Freq: Once | INTRAVENOUS | Status: AC
  Administered 2021-07-13: 111.8 mg via INTRAVENOUS
  Filled 2021-07-13: qty 26

## 2021-07-13 MED ORDER — SODIUM CHLORIDE 0.9 % IV SOLN: Freq: Once | INTRAVENOUS | Status: AC

## 2021-07-13 MED ORDER — LORAZEPAM 1 MG PO TABS
0.5000 mg | ORAL_TABLET | Freq: Once | ORAL | Status: AC
  Administered 2021-07-13: 0.5 mg via ORAL
  Filled 2021-07-13: qty 1

## 2021-07-13 NOTE — Patient Instructions (Signed)
[facility]  Discharge Instructions: Thank you for choosing [facility] to provide your oncology and hematology care.   If you have a lab appointment with the [facility], please go directly to the Industry and check in at the registration area.  Wear comfortable clothing and clothing appropriate for easy access to any Portacath or PICC line.   We strive to give you quality time with your provider. You may need to reschedule your appointment if you arrive late (15 or more minutes).  Arriving late affects you and other patients whose appointments are after yours.  Also, if you miss three or more appointments without notifying the office, you may be dismissed from the clinic at the provider's discretion.      For prescription refill requests, have your pharmacy contact our office and allow 72 hours for refills to be completed.    Today you received the following chemotherapy and/or immunotherapy agents Liposomal Irinotecan, Leucovorin and Adrucil.   To help prevent nausea and vomiting after your treatment, we encourage you to take your nausea medication as directed.  BELOW ARE SYMPTOMS THAT SHOULD BE REPORTED IMMEDIATELY: *FEVER GREATER THAN 100.4 F (38 C) OR HIGHER *CHILLS OR SWEATING *NAUSEA AND VOMITING THAT IS NOT CONTROLLED WITH YOUR NAUSEA MEDICATION *UNUSUAL SHORTNESS OF BREATH *UNUSUAL BRUISING OR BLEEDING *URINARY PROBLEMS (pain or burning when urinating, or frequent urination) *BOWEL PROBLEMS (unusual diarrhea, constipation, pain near the anus) TENDERNESS IN MOUTH AND THROAT WITH OR WITHOUT PRESENCE OF ULCERS (sore throat, sores in mouth, or a toothache) UNUSUAL RASH, SWELLING OR PAIN  UNUSUAL VAGINAL DISCHARGE OR ITCHING   Items with * indicate a potential emergency and should be followed up as soon as possible or go to the Emergency Department if any problems should occur.  Please show the CHEMOTHERAPY ALERT CARD or  IMMUNOTHERAPY ALERT CARD at check-in to the Emergency Department and triage nurse. Should you have questions after your visit or need to cancel or reschedule your appointment, please contact [facility]  [phone] and follow the prompts.  Office hours are 8:00 a.m. to 4:30 p.m. Monday - Friday. Please note that voicemails left after 4:00 p.m. may not be returned until the following business day.  We are closed weekends and major holidays. You have access to a nurse at all times for urgent questions. Please call the main number to the clinic [phone] and follow the prompts.  For any non-urgent questions, you may also contact your provider using MyChart. We now offer e-Visits for anyone 53 and older to request care online for non-urgent symptoms. For details visit [email or web address].   Also download the MyChart app! Go to the app store, search "MyChart", open the app, select [facility], and log in with your MyChart username and password.  Due to Covid, a mask is required upon entering the hospital/clinic. If you do not have a mask, one will be given to you upon arrival. For doctor visits, patients may have 1 support person aged 53 or older with them. For treatment visits, patients cannot have anyone with them due to current Covid guidelines and our immunocompromised population.

## 2021-07-13 NOTE — Progress Notes (Signed)
Okay to treat with increased HR. Patient consistently has rapid HR per Dr. Marin Olp.

## 2021-07-14 ENCOUNTER — Encounter: Payer: Self-pay | Admitting: Hematology & Oncology

## 2021-07-15 ENCOUNTER — Inpatient Hospital Stay (HOSPITAL_BASED_OUTPATIENT_CLINIC_OR_DEPARTMENT_OTHER)
Admission: EM | Admit: 2021-07-15 | Discharge: 2021-08-19 | DRG: 374 | Disposition: E | Payer: 59 | Attending: Pulmonary Disease | Admitting: Pulmonary Disease

## 2021-07-15 ENCOUNTER — Encounter (HOSPITAL_BASED_OUTPATIENT_CLINIC_OR_DEPARTMENT_OTHER): Payer: Self-pay | Admitting: *Deleted

## 2021-07-15 ENCOUNTER — Other Ambulatory Visit: Payer: Self-pay

## 2021-07-15 ENCOUNTER — Emergency Department (HOSPITAL_BASED_OUTPATIENT_CLINIC_OR_DEPARTMENT_OTHER): Payer: 59

## 2021-07-15 ENCOUNTER — Inpatient Hospital Stay: Payer: 59

## 2021-07-15 ENCOUNTER — Encounter: Payer: Self-pay | Admitting: *Deleted

## 2021-07-15 ENCOUNTER — Telehealth: Payer: Self-pay | Admitting: *Deleted

## 2021-07-15 DIAGNOSIS — I82C21 Chronic embolism and thrombosis of right internal jugular vein: Secondary | ICD-10-CM | POA: Diagnosis present

## 2021-07-15 DIAGNOSIS — J9811 Atelectasis: Secondary | ICD-10-CM | POA: Diagnosis present

## 2021-07-15 DIAGNOSIS — K56609 Unspecified intestinal obstruction, unspecified as to partial versus complete obstruction: Secondary | ICD-10-CM | POA: Diagnosis present

## 2021-07-15 DIAGNOSIS — E861 Hypovolemia: Secondary | ICD-10-CM

## 2021-07-15 DIAGNOSIS — R112 Nausea with vomiting, unspecified: Secondary | ICD-10-CM

## 2021-07-15 DIAGNOSIS — J90 Pleural effusion, not elsewhere classified: Secondary | ICD-10-CM | POA: Diagnosis not present

## 2021-07-15 DIAGNOSIS — R9431 Abnormal electrocardiogram [ECG] [EKG]: Secondary | ICD-10-CM | POA: Diagnosis not present

## 2021-07-15 DIAGNOSIS — R180 Malignant ascites: Secondary | ICD-10-CM | POA: Diagnosis present

## 2021-07-15 DIAGNOSIS — E1165 Type 2 diabetes mellitus with hyperglycemia: Secondary | ICD-10-CM | POA: Diagnosis present

## 2021-07-15 DIAGNOSIS — R0602 Shortness of breath: Secondary | ICD-10-CM

## 2021-07-15 DIAGNOSIS — I8290 Acute embolism and thrombosis of unspecified vein: Secondary | ICD-10-CM | POA: Diagnosis not present

## 2021-07-15 DIAGNOSIS — E440 Moderate protein-calorie malnutrition: Secondary | ICD-10-CM | POA: Diagnosis present

## 2021-07-15 DIAGNOSIS — E871 Hypo-osmolality and hyponatremia: Secondary | ICD-10-CM | POA: Diagnosis present

## 2021-07-15 DIAGNOSIS — R531 Weakness: Secondary | ICD-10-CM | POA: Diagnosis not present

## 2021-07-15 DIAGNOSIS — Z7189 Other specified counseling: Secondary | ICD-10-CM

## 2021-07-15 DIAGNOSIS — R1110 Vomiting, unspecified: Secondary | ICD-10-CM

## 2021-07-15 DIAGNOSIS — C252 Malignant neoplasm of tail of pancreas: Secondary | ICD-10-CM

## 2021-07-15 DIAGNOSIS — K567 Ileus, unspecified: Secondary | ICD-10-CM

## 2021-07-15 DIAGNOSIS — C259 Malignant neoplasm of pancreas, unspecified: Secondary | ICD-10-CM | POA: Diagnosis present

## 2021-07-15 DIAGNOSIS — J9601 Acute respiratory failure with hypoxia: Secondary | ICD-10-CM | POA: Diagnosis present

## 2021-07-15 DIAGNOSIS — C772 Secondary and unspecified malignant neoplasm of intra-abdominal lymph nodes: Secondary | ICD-10-CM | POA: Diagnosis present

## 2021-07-15 DIAGNOSIS — D649 Anemia, unspecified: Secondary | ICD-10-CM | POA: Diagnosis present

## 2021-07-15 DIAGNOSIS — C799 Secondary malignant neoplasm of unspecified site: Secondary | ICD-10-CM | POA: Diagnosis present

## 2021-07-15 DIAGNOSIS — E119 Type 2 diabetes mellitus without complications: Secondary | ICD-10-CM

## 2021-07-15 DIAGNOSIS — I10 Essential (primary) hypertension: Secondary | ICD-10-CM | POA: Diagnosis present

## 2021-07-15 DIAGNOSIS — E43 Unspecified severe protein-calorie malnutrition: Secondary | ICD-10-CM | POA: Diagnosis present

## 2021-07-15 DIAGNOSIS — R140 Abdominal distension (gaseous): Secondary | ICD-10-CM | POA: Diagnosis not present

## 2021-07-15 DIAGNOSIS — M7989 Other specified soft tissue disorders: Secondary | ICD-10-CM

## 2021-07-15 DIAGNOSIS — G928 Other toxic encephalopathy: Secondary | ICD-10-CM | POA: Diagnosis not present

## 2021-07-15 DIAGNOSIS — R188 Other ascites: Secondary | ICD-10-CM

## 2021-07-15 DIAGNOSIS — R34 Anuria and oliguria: Secondary | ICD-10-CM | POA: Diagnosis not present

## 2021-07-15 DIAGNOSIS — R579 Shock, unspecified: Secondary | ICD-10-CM

## 2021-07-15 DIAGNOSIS — Z515 Encounter for palliative care: Secondary | ICD-10-CM | POA: Diagnosis not present

## 2021-07-15 DIAGNOSIS — I82409 Acute embolism and thrombosis of unspecified deep veins of unspecified lower extremity: Secondary | ICD-10-CM

## 2021-07-15 DIAGNOSIS — D638 Anemia in other chronic diseases classified elsewhere: Secondary | ICD-10-CM | POA: Diagnosis present

## 2021-07-15 DIAGNOSIS — C250 Malignant neoplasm of head of pancreas: Secondary | ICD-10-CM

## 2021-07-15 DIAGNOSIS — I959 Hypotension, unspecified: Secondary | ICD-10-CM

## 2021-07-15 DIAGNOSIS — G893 Neoplasm related pain (acute) (chronic): Secondary | ICD-10-CM

## 2021-07-15 DIAGNOSIS — C782 Secondary malignant neoplasm of pleura: Secondary | ICD-10-CM | POA: Diagnosis present

## 2021-07-15 DIAGNOSIS — C787 Secondary malignant neoplasm of liver and intrahepatic bile duct: Secondary | ICD-10-CM | POA: Diagnosis present

## 2021-07-15 DIAGNOSIS — M79601 Pain in right arm: Secondary | ICD-10-CM | POA: Diagnosis not present

## 2021-07-15 DIAGNOSIS — Z789 Other specified health status: Secondary | ICD-10-CM

## 2021-07-15 DIAGNOSIS — E876 Hypokalemia: Secondary | ICD-10-CM | POA: Diagnosis not present

## 2021-07-15 DIAGNOSIS — R578 Other shock: Secondary | ICD-10-CM | POA: Diagnosis not present

## 2021-07-15 DIAGNOSIS — Z6832 Body mass index (BMI) 32.0-32.9, adult: Secondary | ICD-10-CM | POA: Diagnosis not present

## 2021-07-15 DIAGNOSIS — K921 Melena: Secondary | ICD-10-CM | POA: Diagnosis not present

## 2021-07-15 DIAGNOSIS — Z4659 Encounter for fitting and adjustment of other gastrointestinal appliance and device: Secondary | ICD-10-CM

## 2021-07-15 DIAGNOSIS — C786 Secondary malignant neoplasm of retroperitoneum and peritoneum: Secondary | ICD-10-CM | POA: Diagnosis present

## 2021-07-15 DIAGNOSIS — Z66 Do not resuscitate: Secondary | ICD-10-CM | POA: Diagnosis not present

## 2021-07-15 DIAGNOSIS — J910 Malignant pleural effusion: Secondary | ICD-10-CM | POA: Diagnosis present

## 2021-07-15 DIAGNOSIS — K8689 Other specified diseases of pancreas: Secondary | ICD-10-CM

## 2021-07-15 DIAGNOSIS — Z20822 Contact with and (suspected) exposure to covid-19: Secondary | ICD-10-CM | POA: Diagnosis present

## 2021-07-15 LAB — GLUCOSE, CAPILLARY: Glucose-Capillary: 176 mg/dL — ABNORMAL HIGH (ref 70–99)

## 2021-07-15 LAB — PROTIME-INR
INR: 1.8 — ABNORMAL HIGH (ref 0.8–1.2)
Prothrombin Time: 20.8 seconds — ABNORMAL HIGH (ref 11.4–15.2)

## 2021-07-15 LAB — CBC WITH DIFFERENTIAL/PLATELET
Abs Immature Granulocytes: 0.03 10*3/uL (ref 0.00–0.07)
Basophils Absolute: 0 10*3/uL (ref 0.0–0.1)
Basophils Relative: 0 %
Eosinophils Absolute: 0 10*3/uL (ref 0.0–0.5)
Eosinophils Relative: 0 %
HCT: 36 % (ref 36.0–46.0)
Hemoglobin: 11.2 g/dL — ABNORMAL LOW (ref 12.0–15.0)
Immature Granulocytes: 0 %
Lymphocytes Relative: 11 %
Lymphs Abs: 0.8 10*3/uL (ref 0.7–4.0)
MCH: 26.4 pg (ref 26.0–34.0)
MCHC: 31.1 g/dL (ref 30.0–36.0)
MCV: 84.7 fL (ref 80.0–100.0)
Monocytes Absolute: 0.2 10*3/uL (ref 0.1–1.0)
Monocytes Relative: 3 %
Neutro Abs: 5.9 10*3/uL (ref 1.7–7.7)
Neutrophils Relative %: 86 %
Platelets: 176 10*3/uL (ref 150–400)
RBC: 4.25 MIL/uL (ref 3.87–5.11)
RDW: 17.8 % — ABNORMAL HIGH (ref 11.5–15.5)
WBC: 6.9 10*3/uL (ref 4.0–10.5)
nRBC: 0 % (ref 0.0–0.2)

## 2021-07-15 LAB — COMPREHENSIVE METABOLIC PANEL
ALT: 17 U/L (ref 0–44)
AST: 21 U/L (ref 15–41)
Albumin: 2 g/dL — ABNORMAL LOW (ref 3.5–5.0)
Alkaline Phosphatase: 60 U/L (ref 38–126)
Anion gap: 8 (ref 5–15)
BUN: 13 mg/dL (ref 6–20)
CO2: 25 mmol/L (ref 22–32)
Calcium: 8 mg/dL — ABNORMAL LOW (ref 8.9–10.3)
Chloride: 96 mmol/L — ABNORMAL LOW (ref 98–111)
Creatinine, Ser: 0.61 mg/dL (ref 0.44–1.00)
GFR, Estimated: >60 mL/min (ref >60)
Glucose, Bld: 232 mg/dL — ABNORMAL HIGH (ref 70–99)
Potassium: 4.1 mmol/L (ref 3.5–5.1)
Sodium: 129 mmol/L — ABNORMAL LOW (ref 135–145)
Total Bilirubin: 0.6 mg/dL (ref 0.3–1.2)
Total Protein: 5.8 g/dL — ABNORMAL LOW (ref 6.5–8.1)

## 2021-07-15 LAB — RESP PANEL BY RT-PCR (FLU A&B, COVID) ARPGX2
Influenza A by PCR: NEGATIVE
Influenza B by PCR: NEGATIVE
SARS Coronavirus 2 by RT PCR: NEGATIVE

## 2021-07-15 LAB — LACTIC ACID, PLASMA: Lactic Acid, Venous: 0.9 mmol/L (ref 0.5–1.9)

## 2021-07-15 MED ORDER — FUROSEMIDE 10 MG/ML IJ SOLN
20.0000 mg | Freq: Once | INTRAMUSCULAR | Status: AC
  Administered 2021-07-15: 20 mg via INTRAVENOUS
  Filled 2021-07-15: qty 2

## 2021-07-15 MED ORDER — SODIUM CHLORIDE 0.9% FLUSH
10.0000 mL | INTRAVENOUS | Status: DC | PRN
  Administered 2021-07-15: 10 mL

## 2021-07-15 MED ORDER — ONDANSETRON HCL 4 MG/2ML IJ SOLN
4.0000 mg | Freq: Four times a day (QID) | INTRAMUSCULAR | Status: DC | PRN
  Administered 2021-07-16 – 2021-07-27 (×9): 4 mg via INTRAVENOUS
  Filled 2021-07-15 (×9): qty 2

## 2021-07-15 MED ORDER — ORAL CARE MOUTH RINSE
15.0000 mL | Freq: Two times a day (BID) | OROMUCOSAL | Status: DC
  Administered 2021-07-15 – 2021-07-22 (×12): 15 mL via OROMUCOSAL

## 2021-07-15 MED ORDER — DRONABINOL 2.5 MG PO CAPS
2.5000 mg | ORAL_CAPSULE | Freq: Two times a day (BID) | ORAL | Status: DC
  Administered 2021-07-16 – 2021-07-23 (×15): 2.5 mg via ORAL
  Filled 2021-07-15 (×17): qty 1

## 2021-07-15 MED ORDER — ACETAMINOPHEN 500 MG PO TABS
1000.0000 mg | ORAL_TABLET | Freq: Four times a day (QID) | ORAL | Status: DC | PRN
  Administered 2021-07-17 – 2021-07-23 (×5): 1000 mg via ORAL
  Filled 2021-07-15 (×6): qty 2

## 2021-07-15 MED ORDER — ONDANSETRON HCL 4 MG PO TABS: 4.0000 mg | ORAL_TABLET | Freq: Four times a day (QID) | ORAL | Status: DC | PRN

## 2021-07-15 MED ORDER — HYDROMORPHONE HCL 2 MG PO TABS
4.0000 mg | ORAL_TABLET | ORAL | Status: DC | PRN
  Administered 2021-07-16 – 2021-07-21 (×4): 4 mg via ORAL
  Filled 2021-07-15 (×4): qty 2

## 2021-07-15 MED ORDER — RIVAROXABAN 20 MG PO TABS
20.0000 mg | ORAL_TABLET | Freq: Every day | ORAL | Status: DC
  Administered 2021-07-16 – 2021-07-20 (×5): 20 mg via ORAL
  Filled 2021-07-15 (×6): qty 1

## 2021-07-15 MED ORDER — PROCHLORPERAZINE EDISYLATE 10 MG/2ML IJ SOLN
10.0000 mg | Freq: Four times a day (QID) | INTRAMUSCULAR | Status: DC | PRN
  Administered 2021-07-15 – 2021-07-24 (×6): 10 mg via INTRAVENOUS
  Filled 2021-07-15 (×6): qty 2

## 2021-07-15 MED ORDER — INSULIN ASPART 100 UNIT/ML IJ SOLN
0.0000 [IU] | Freq: Three times a day (TID) | INTRAMUSCULAR | Status: DC
  Administered 2021-07-16 (×2): 3 [IU] via SUBCUTANEOUS
  Administered 2021-07-16 – 2021-07-17 (×4): 2 [IU] via SUBCUTANEOUS
  Administered 2021-07-18: 3 [IU] via SUBCUTANEOUS
  Administered 2021-07-18: 2 [IU] via SUBCUTANEOUS
  Administered 2021-07-18 – 2021-07-19 (×3): 3 [IU] via SUBCUTANEOUS
  Administered 2021-07-19 – 2021-07-20 (×2): 2 [IU] via SUBCUTANEOUS
  Administered 2021-07-20 (×2): 3 [IU] via SUBCUTANEOUS
  Administered 2021-07-21: 2 [IU] via SUBCUTANEOUS
  Administered 2021-07-21 – 2021-07-22 (×3): 3 [IU] via SUBCUTANEOUS
  Administered 2021-07-22: 2 [IU] via SUBCUTANEOUS
  Administered 2021-07-22: 3 [IU] via SUBCUTANEOUS
  Administered 2021-07-23: 5 [IU] via SUBCUTANEOUS
  Administered 2021-07-23: 3 [IU] via SUBCUTANEOUS
  Administered 2021-07-23 – 2021-07-24 (×2): 2 [IU] via SUBCUTANEOUS
  Administered 2021-07-24: 5 [IU] via SUBCUTANEOUS
  Administered 2021-07-24: 7 [IU] via SUBCUTANEOUS
  Administered 2021-07-25: 3 [IU] via SUBCUTANEOUS
  Administered 2021-07-25: 2 [IU] via SUBCUTANEOUS
  Administered 2021-07-25: 3 [IU] via SUBCUTANEOUS
  Administered 2021-07-26: 2 [IU] via SUBCUTANEOUS
  Administered 2021-07-26 – 2021-07-27 (×3): 3 [IU] via SUBCUTANEOUS

## 2021-07-15 MED ORDER — DICYCLOMINE HCL 10 MG PO CAPS
10.0000 mg | ORAL_CAPSULE | Freq: Four times a day (QID) | ORAL | Status: DC
  Administered 2021-07-16 – 2021-07-18 (×8): 10 mg via ORAL
  Filled 2021-07-15 (×10): qty 1

## 2021-07-15 MED ORDER — LORAZEPAM 0.5 MG PO TABS
0.5000 mg | ORAL_TABLET | Freq: Four times a day (QID) | ORAL | Status: DC | PRN
  Administered 2021-07-17 – 2021-07-19 (×3): 0.5 mg via ORAL
  Filled 2021-07-15 (×3): qty 1

## 2021-07-15 MED ORDER — OLANZAPINE 5 MG PO TABS
10.0000 mg | ORAL_TABLET | Freq: Every day | ORAL | Status: DC
  Administered 2021-07-15 – 2021-07-22 (×7): 10 mg via ORAL
  Filled 2021-07-15 (×9): qty 2
  Filled 2021-07-15: qty 1

## 2021-07-15 MED ORDER — SODIUM CHLORIDE 0.9% FLUSH
3.0000 mL | Freq: Two times a day (BID) | INTRAVENOUS | Status: DC
  Administered 2021-07-15 – 2021-08-04 (×32): 3 mL via INTRAVENOUS

## 2021-07-15 MED ORDER — HEPARIN SOD (PORK) LOCK FLUSH 100 UNIT/ML IV SOLN
500.0000 [IU] | Freq: Once | INTRAVENOUS | Status: AC | PRN
  Administered 2021-07-15: 500 [IU]

## 2021-07-15 MED ORDER — ACETAMINOPHEN 650 MG RE SUPP
650.0000 mg | Freq: Four times a day (QID) | RECTAL | Status: DC | PRN
  Administered 2021-08-03: 650 mg via RECTAL
  Filled 2021-07-15: qty 1

## 2021-07-15 MED ORDER — SENNOSIDES-DOCUSATE SODIUM 8.6-50 MG PO TABS: 1.0000 | ORAL_TABLET | Freq: Every evening | ORAL | Status: DC | PRN

## 2021-07-15 NOTE — Progress Notes (Signed)
   06/25/2021 1830  Assess: MEWS Score  Temp 99 F (37.2 C)  BP (!) 89/78  Pulse Rate (!) 124  Resp 14  SpO2 99 %  O2 Device Nasal Cannula  O2 Flow Rate (L/min) 2 L/min  Assess: MEWS Score  MEWS Temp 0  MEWS Systolic 1  MEWS Pulse 2  MEWS RR 0  MEWS LOC 0  MEWS Score 3  MEWS Score Color Yellow  Assess: if the MEWS score is Yellow or Red  Were vital signs taken at a resting state? Yes  Focused Assessment No change from prior assessment  Does the patient meet 2 or more of the SIRS criteria? No  MEWS guidelines implemented *See Row Information* Yes  Treat  MEWS Interventions Escalated (See documentation below)  Pain Scale 0-10  Pain Score 7  Pain Type Acute pain  Pain Location Abdomen  Pain Orientation Mid  Pain Descriptors / Indicators Discomfort  Pain Frequency Constant  Pain Onset On-going  Take Vital Signs  Increase Vital Sign Frequency  Yellow: Q 2hr X 2 then Q 4hr X 2, if remains yellow, continue Q 4hrs  Escalate  MEWS: Escalate Yellow: discuss with charge nurse/RN and consider discussing with provider and RRT  Notify: Charge Nurse/RN  Name of Charge Nurse/RN Notified Wayna Chalet, RN  Date Charge Nurse/RN Notified 06/20/2021  Time Charge Nurse/RN Notified [place]  Notify: Provider  Provider Name/Title Admitting Hospitalist  Date Provider Notified 07/04/2021  Time Provider Notified [id number]  Notification Type Page  Notification Reason Other (Comment) (Yellow MEW. New admission.)  Provider response Other (Comment) (Awaiting response)  Date of Provider Response 07/08/2021  Document  Patient Outcome Other (Comment) (Awaiting MD response. MD paged.)  Progress note created (see row info) Yes  Assess: SIRS CRITERIA  SIRS Temperature  0  SIRS Pulse 1  SIRS Respirations  0  SIRS WBC 0  SIRS Score Sum  1  New admission. Pt in yellow MEWS. Agricultural consultant notified. Admitting MD notified via page. Yellow MEWS protocol implemented.

## 2021-07-15 NOTE — Telephone Encounter (Signed)
I returned daughters phone call regarding leg swelling. She stated,"mom's legs are swelling and she can't lift them off the bed to the floor." I instructed her to come in now so we can disconnect the 5FU (chemotherapy) pump and then she needs to go to the ER to be seen. I reinforced that this has been an ongoing problem with her legs. Also, I told her Wednesday that Dr. Marin Olp is out of the office this week. She verbalized understanding.

## 2021-07-15 NOTE — ED Triage Notes (Signed)
She was seen by Dr Marin Olp today for legs swelling for a week and vomiting. She was told to come here for further evaluation.

## 2021-07-15 NOTE — ED Notes (Signed)
Report given to [facility]. 

## 2021-07-15 NOTE — ED Provider Notes (Signed)
[facility] Provider Note   CSN: [id number] Arrival date & time: 07/11/2021  1244     History Chief Complaint  Patient presents with   Emesis   Leg Swelling    Angela Wilson is a 55 y.o. female with PMH metastatic pancreatic cancer currently on palliative chemotherapy who presents to the emergency department for evaluation of fatigue, vomiting, lower extremity swelling.  Patient had a discussion with her oncology team today about worsening lower extremity swelling and vomiting and was told to come to the emergency department for further evaluation.  On initial presentation, history is obtained by the patient's son-in-law who states that for the last 10 days she has been bedbound and has had worsening vomiting since starting a new chemotherapeutic regimen.  Her swelling in her lower extremities has worsened and the son-in-law would like the patient evaluated for lower extremity DVTs.  On initial presentation, patient tachycardic into the 120s, appears fatigued and somnolent with noted abdominal distention.   Emesis Associated symptoms: no abdominal pain, no arthralgias, no chills, no cough, no fever and no sore throat       Past Medical History:  Diagnosis Date   Diabetes mellitus without complication ([place])    Family history of brain cancer    Family history of breast cancer    Family history of kidney cancer    Family history of ovarian cancer    GERD (gastroesophageal reflux disease)    Hyperlipidemia    Hypertension    Kidney stone    Pancreatic cancer ([place]) 02/03/2020   Shingles    Type 2 diabetes mellitus ([place])     Patient Active Problem List   Diagnosis Date Noted   Acute respiratory failure with hypoxia ([place]) 06/26/2021   Genetic testing 05/12/2020   Family history of breast cancer    Family history of ovarian cancer    Family history of kidney cancer    Family history of brain cancer    Essential hypertension, benign 04/06/2020    Pancreatic cancer ([place]) 02/14/2020   Cancer associated pain 02/13/2020   Pancreatic cancer ([place]) 02/03/2020   Screening for colon cancer 01/07/2020   Encounter for screening mammogram for malignant neoplasm of breast 01/07/2020   Need for Tdap vaccination 01/07/2020   Pancreatic mass 12/30/2019   Nephrolithiasis 12/20/2012   GERD (gastroesophageal reflux disease) 12/20/2012   Type 2 diabetes mellitus ([place]) 11/09/2012    Past Surgical History:  Procedure Laterality Date   CESAREAN SECTION     x 4   IR IMAGING GUIDED PORT INSERTION  02/18/2020   KIDNEY STONE SURGERY       OB History   No obstetric history on file.     Family History  Problem Relation Age of Onset   Heart attack Maternal Uncle    Breast cancer Paternal Aunt        dx. in her 60s   Brain cancer Paternal Uncle 63   Kidney cancer Cousin        dx. <50   Ovarian cancer Cousin    Breast cancer Cousin        dx. "young"   Ovarian cancer Cousin    Cancer Cousin        Cancer on the back, dx. in her 77s   Lung cancer Cousin     Social History   Tobacco Use   Smoking status: Never   Smokeless tobacco: Never  Vaping Use   Vaping Use: Never used  Substance  Use Topics   Alcohol use: No   Drug use: Never    Home Medications Prior to Admission medications   Medication Sig Start Date End Date Taking? Authorizing Provider  albuterol (PROVENTIL HFA;VENTOLIN HFA) 108 (90 Base) MCG/ACT inhaler Inhale 2 puffs into the lungs every 4 (four) hours as needed for wheezing or shortness of breath. Patient not taking: Reported on 07/05/2021 04/01/16   Janne Napoleon, NP  amLODipine (NORVASC) 5 MG tablet Take 5 mg by mouth daily. 06/17/21   [provider]  cetirizine (ZYRTEC) 10 MG tablet Take 1 tablet (10 mg total) by mouth daily. Patient not taking: Reported on 07/05/2021 04/06/20   Jacelyn Pi, Lilia Argue, MD  dexamethasone (DECADRON) 4 MG tablet Take 1 tablet (4 mg total) by mouth 2 (two) times daily with a meal.  For three (3) days following chemotherapy. Patient not taking: Reported on 07/05/2021 03/26/21   Volanda Napoleon, MD  dicyclomine (BENTYL) 10 MG capsule Take 10 mg by mouth 4 (four) times daily. 06/03/21   [provider]  diphenoxylate-atropine (LOMOTIL) 2.5-0.025 MG tablet Take 2 tablets by mouth 4 (four) times daily as needed for diarrhea or loose stools. 06/23/21   Volanda Napoleon, MD  dronabinol (MARINOL) 2.5 MG capsule Take 1 capsule (2.5 mg total) by mouth 2 (two) times daily before lunch and supper. 06/23/21   Volanda Napoleon, MD  ergocalciferol (VITAMIN D2) 1.25 MG (50000 UT) capsule Take 1 capsule (50,000 Units total) by mouth once a week. 10/07/20   Just, Laurita Quint, FNP  fentaNYL (DURAGESIC) 75 MCG/HR Place 2 patches onto the skin every other day. 07/05/21   Volanda Napoleon, MD  gabapentin (NEURONTIN) 300 MG capsule TAKE 2 CAPSULES BY MOUTH IN THE DAYTIME AND 2 CAPSULES AT BEDTIME 12/08/20 12/08/21  Volanda Napoleon, MD  HYDROcodone bit-homatropine (HYCODAN) 5-1.5 MG/5ML syrup Take 5 mLs by mouth every 6 (six) hours as needed for cough. Patient not taking: Reported on 07/05/2021 03/04/21   Volanda Napoleon, MD  HYDROmorphone (DILAUDID) 4 MG tablet Take 1 tablet (4 mg total) by mouth every 4 (four) hours as needed for severe pain. 06/23/21   Volanda Napoleon, MD  Lactulose 20 GM/30ML SOLN Take 30 mLs (20 g total) by mouth 3 (three) times daily as needed. Patient not taking: Reported on 07/05/2021 05/07/21   Volanda Napoleon, MD  lidocaine-prilocaine (EMLA) cream APPLY TO PORT 1 HOUR BEFORE ACCESS. 02/17/21 02/17/22  Volanda Napoleon, MD  lisinopril (ZESTRIL) 10 MG tablet Take by mouth daily. 06/17/21   [provider]  LORazepam (ATIVAN) 0.5 MG tablet Take 0.5 mg every six hours as needed for nausea. 06/25/21   Volanda Napoleon, MD  morphine (ROXANOL) 20 MG/ML concentrated solution Take 1 mL (20 mg total) by mouth every 3 (three) hours as needed for severe pain. 07/09/21   Volanda Napoleon, MD  OLANZapine (ZYPREXA) 10 MG tablet Take 1 tablet (10 mg total) by mouth at bedtime. Take every day for nausea/vomiting 06/07/21   Volanda Napoleon, MD  omeprazole (PRILOSEC) 40 MG capsule Take 40 mg by mouth daily as needed (acid reflux). 04/22/21   [provider]  ondansetron (ZOFRAN) 4 MG tablet Take 1 tablet (4 mg total) by mouth every 6 (six) hours as needed for nausea. 07/13/21   Celso Amy, NP  polyethylene glycol (MIRALAX / GLYCOLAX) 17 g packet Take 17 g by mouth daily as needed. Patient not taking: Reported on 07/05/2021 02/18/20  Alma Friendly, MD  prochlorperazine (COMPAZINE) 10 MG tablet Take 1 tablet (10 mg total) by mouth every 6 (six) hours as needed for nausea or vomiting. 05/27/21   Volanda Napoleon, MD  prochlorperazine (COMPAZINE) 25 MG suppository Unwrap and place 1 suppository (25 mg total) rectally every 8 (eight) hours as needed for nausea or vomiting. 07/05/21   Volanda Napoleon, MD  rivaroxaban (XARELTO) 20 MG TABS tablet Take 1 tablet (20 mg total) by mouth daily with supper. 05/12/21   Volanda Napoleon, MD  morphine 20 MG SUPP Place 1 suppository (20 mg total) rectally every 4 (four) hours as needed. 07/05/21   Volanda Napoleon, MD    Allergies    Ivp dye [iodinated diagnostic agents], Morphine and related, and Penicillins  Review of Systems   Review of Systems  Constitutional:  Positive for fatigue. Negative for chills and fever.  HENT:  Negative for ear pain and sore throat.   Eyes:  Negative for pain and visual disturbance.  Respiratory:  Negative for cough and shortness of breath.   Cardiovascular:  Positive for leg swelling. Negative for chest pain and palpitations.  Gastrointestinal:  Positive for abdominal distention, nausea and vomiting. Negative for abdominal pain.  Genitourinary:  Negative for dysuria and hematuria.  Musculoskeletal:  Negative for arthralgias and back pain.  Skin:  Negative for color change and rash.   Neurological:  Negative for seizures and syncope.  All other systems reviewed and are negative.  Physical Exam Updated Vital Signs BP 138/88 (BP Location: Right Arm)   Pulse (!) 127   Temp 98.5 F (36.9 C) (Oral)   Resp (!) 24   Ht 5\' 6"  (1.676 m)   Wt 100.7 kg   SpO2 98%   BMI 35.83 kg/m   Physical Exam Vitals and nursing note reviewed.  Constitutional:      General: She is not in acute distress.    Appearance: She is well-developed. She is ill-appearing.  HENT:     Head: Normocephalic and atraumatic.  Eyes:     Conjunctiva/sclera: Conjunctivae normal.  Cardiovascular:     Rate and Rhythm: Regular rhythm. Tachycardia present.     Heart sounds: No murmur heard. Pulmonary:     Effort: Pulmonary effort is normal. No respiratory distress.     Breath sounds: Normal breath sounds.  Abdominal:     General: There is distension.     Palpations: Abdomen is soft.     Tenderness: There is no abdominal tenderness.  Musculoskeletal:     Cervical back: Neck supple.     Right lower leg: Edema present.     Left lower leg: Edema present.  Skin:    General: Skin is warm and dry.  Neurological:     Mental Status: She is alert.    ED Results / Procedures / Treatments   Labs (all labs ordered are listed, but only abnormal results are displayed) Labs Reviewed  COMPREHENSIVE METABOLIC PANEL - Abnormal; Notable for the following components:      Result Value   Sodium 129 (*)    Chloride 96 (*)    Glucose, Bld 232 (*)    Calcium 8.0 (*)    Total Protein 5.8 (*)    Albumin 2.0 (*)    All other components within normal limits  CBC WITH DIFFERENTIAL/PLATELET - Abnormal; Notable for the following components:   Hemoglobin 11.2 (*)    RDW 17.8 (*)    All other components within normal limits  PROTIME-INR - Abnormal; Notable for the following components:   Prothrombin Time 20.8 (*)    INR 1.8 (*)    All other components within normal limits  CULTURE, BLOOD (ROUTINE X 2)  CULTURE,  BLOOD (ROUTINE X 2)  RESP PANEL BY RT-PCR (FLU A&B, COVID) ARPGX2  LACTIC ACID, PLASMA  URINALYSIS, ROUTINE W REFLEX MICROSCOPIC    EKG None  Radiology CT ABDOMEN PELVIS WO CONTRAST  Result Date: 07/12/2021 CLINICAL DATA:  History of pancreatic cancer.  Abdominal distention. EXAM: CT ABDOMEN AND PELVIS WITHOUT CONTRAST TECHNIQUE: Multidetector CT imaging of the abdomen and pelvis was performed following the standard protocol without IV contrast. COMPARISON:  April 21, 2021. FINDINGS: Lower chest: Increased right pleural effusion is noted with associated atelectasis of the right lower lobe. Hepatobiliary: Heterogeneous appearance of hepatic parenchyma is noted suggesting diffuse metastatic disease as noted on prior exam. Some degree of hepatic steatosis may be present as well. Status post cholecystectomy. No definite biliary dilatation is noted. Pancreas: Solid mass is seen involving pancreatic tail measuring 4.8 x 2.6 cm consistent with history of pancreatic adenocarcinoma. No definite ductal dilatation is noted. Spleen: Normal in size without focal abnormality. Adrenals/Urinary Tract: Adrenal glands appear normal. Nonobstructive left nephrolithiasis is noted. No hydronephrosis or renal obstruction is noted. Urinary bladder is decompressed. Stomach/Bowel: The stomach appears normal. There is no evidence of bowel obstruction or inflammation. Vascular/Lymphatic: No significant vascular findings are present. No enlarged abdominal or pelvic lymph nodes. Reproductive: Uterus is unremarkable. 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 These are significantly enlarged compared to prior exam. Overall they measure 15.5 x 11.0 cm, and it is difficult to distinguish right from left. Other: There is interval development of mild to moderate ascites in the pelvis and around the liver and spleen. 7.9 x 5.0 cm mass is seen along the  anterior abdominal wall in the region of the umbilicus which is enlarged compared to prior exam and consistent with worsening peritoneal implant. Musculoskeletal: Grade 1 anterolisthesis of L4-5 is noted secondary to bilateral L5 spondylolysis. No acute osseous abnormality is noted. IMPRESSION: 4.8 x 2.6 cm solid discrete mass is seen involving pancreatic tail consistent with history of pancreatic adenocarcinoma. Mild to moderate ascites is noted both in the pelvis and around the liver and spleen in the upper abdomen. 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 This is significantly enlarged compared to prior exam. Significantly enlarged peritoneal implant is seen along the anterior abdominal wall in the periumbilical region consistent with peritoneal carcinomatosis. Heterogeneous appearance of hepatic parenchyma is noted consistent with multiple metastatic lesions. Some degree of hepatic steatosis may be present as well. Increased right pleural effusion is noted with associated atelectasis of the right lower lobe. Electronically Signed   By: Marijo Conception M.D.   On: 06/26/2021 14:56   DG Chest 2 View  Result Date: 07/08/2021 CLINICAL DATA:  Suspected sepsis EXAM: CHEST - 2 VIEW COMPARISON:  04/08/2020 FINDINGS: Low volume examination. Small right pleural effusion associated atelectasis or consolidation. The left lung is normally aerated. Cardiomegaly. Right chest port catheter. IMPRESSION: Low volume examination. Small right pleural effusion and associated atelectasis or consolidation. Electronically Signed   By: Delanna Ahmadi M.D.   On: 07/04/2021 14:37   US Venous Img Lower Bilateral (DVT)  Result Date: 07/18/2021 CLINICAL DATA:  Bilateral lower extremity edema and history  of pancreatic cancer. EXAM: BILATERAL LOWER EXTREMITY VENOUS DOPPLER ULTRASOUND TECHNIQUE: Gray-scale sonography with graded compression, as  well as color Doppler and duplex ultrasound were performed to evaluate the lower extremity deep venous systems from the level of the common femoral vein and including the common femoral, femoral, profunda femoral, popliteal and calf veins including the posterior tibial, peroneal and gastrocnemius veins when visible. The superficial great saphenous vein was also interrogated. Spectral Doppler was utilized to evaluate flow at rest and with distal augmentation maneuvers in the common femoral, femoral and popliteal veins. COMPARISON:  None. FINDINGS: RIGHT LOWER EXTREMITY Common Femoral Vein: No evidence of thrombus. Normal compressibility, respiratory phasicity and response to augmentation. Saphenofemoral Junction: No evidence of thrombus. Normal compressibility and flow on color Doppler imaging. Profunda Femoral Vein: No evidence of thrombus. Normal compressibility and flow on color Doppler imaging. Femoral Vein: No evidence of thrombus. Normal compressibility, respiratory phasicity and response to augmentation. Popliteal Vein: No evidence of thrombus. Normal compressibility, respiratory phasicity and response to augmentation. Calf Veins: No evidence of thrombus. Normal compressibility and flow on color Doppler imaging. Superficial Great Saphenous Vein: No evidence of thrombus. Normal compressibility. Venous Reflux:  None. Other Findings: Baker's cyst within the popliteal fossa measures 3.8 by 1.1 x 2.3 cm. LEFT LOWER EXTREMITY Common Femoral Vein: No evidence of thrombus. Normal compressibility, respiratory phasicity and response to augmentation. Saphenofemoral Junction: No evidence of thrombus. Normal compressibility and flow on color Doppler imaging. Profunda Femoral Vein: No evidence of thrombus. Normal compressibility and flow on color Doppler imaging. Femoral Vein: No evidence of thrombus. Normal compressibility, respiratory phasicity and response to augmentation. Popliteal Vein: No evidence of thrombus. Normal  compressibility, respiratory phasicity and response to augmentation. Calf Veins: No evidence of thrombus. Normal compressibility and flow on color Doppler imaging. Superficial Great Saphenous Vein: No evidence of thrombus. Normal compressibility. Venous Reflux:  None. Other Findings: Complex fluid collection within the popliteal fossa measures 3.4 x 1.2 x 1.7 cm containing diffuse internal echoes and areas of calcification. IMPRESSION: 1. No evidence of deep venous thrombosis in either lower extremity. 2. Right-sided Baker's cyst. 3. Complex fluid collection within the left popliteal fossa containing internal echoes and calcification. Favor complex Baker's cyst with internal debris and calcification. Electronically Signed   By: Kerby Moors M.D.   On: 07/14/2021 15:04    Procedures .Critical Care Performed by: Teressa Lower, MD Authorized by: Teressa Lower, MD   Critical care provider statement:    Critical care time (minutes):  45   Critical care was necessary to treat or prevent imminent or life-threatening deterioration of the following conditions:  Respiratory failure   Critical care was time spent personally by me on the following activities:  Ordering and performing treatments and interventions, ordering and review of laboratory studies, ordering and review of radiographic studies, pulse oximetry, evaluation of patient's response to treatment, examination of patient and review of old charts   Medications Ordered in ED Medications - No data to display  ED Course  I have reviewed the triage vital signs and the nursing notes.  Pertinent labs & imaging results that were available during my care of the patient were reviewed by me and considered in my medical decision making (see chart for details).    MDM Rules/Calculators/A&P                           Patient seen emergency department for evaluation of leg swelling and persistent vomiting.  Physical exam reveals an ill-appearing  patient with a regular tachycardia, significant abdominal distention and lower extremity pitting edema.  Patient arrives with hypoxia into the high 80s and was placed on 2 L nasal cannula.  Laboratory evaluation with hyponatremia 129, hypochloremia to 96, hypoalbuminemia to 2.0, INR 1.8, hemoglobin 11.2 but is otherwise unremarkable.  Initial lactate 0.9.  Chest x-ray with a small right pleural effusion.  CT abdomen pelvis with a 4.8 x 2.6 solid discrete mass in the pancreatic tail, mild to moderate ascites, large complex multicystic mass in the pelvis representing ovarian metastatic disease significantly enlarged compared to prior exam, significantly enlarged peritoneal carcinomatosis as well as an increased right pleural effusion.  Patient will require admission for paracentesis and diuresis in the setting of a new oxygen requirement.  I clarified patient's goals of care with patient's son-in-law and the patient is still a full code.  Patient then admitted to the hospitalist team. Final Clinical Impression(s) / ED Diagnoses Final diagnoses:  Malignant ascites  Malignant neoplasm of pancreas, unspecified location of malignancy [facility])    Rx / [place] Orders ED Discharge Orders     None        Lavonne Cass, Debe Coder, MD 06/27/2021 1622

## 2021-07-15 NOTE — ED Notes (Signed)
Patient reports bilateral lower extremity edema, vomiting.  Patient has abdominal distension and 2+ edema to bilateral lower extremities.

## 2021-07-15 NOTE — H&P (Signed)
Consent History and Physical    Angela Wilson GGY:[id number] DOB: 11/10/1965 DOA: 06/19/2021  PCP: Just, Laurita Quint, FNP (Inactive)  Patient coming from: [place] ED  I have personally briefly reviewed patient's old medical records in [place]  Chief Complaint: Leg swelling, vomiting  HPI: Angela Wilson is an Arabic speaking 55 y.o. female with medical history significant for metastatic pancreatic adenocarcinoma with malignant right pleural effusion and lymph node involvement on active chemotherapy, thrombus of the right IJ vein, type 2 diabetes, hypertension who presented to the ED for evaluation of leg swelling and vomiting.  Communication facilitated via patient's daughter who interprets and declined use of formal interpretive services.  Patient with known metastatic pancreatic adenocarcinoma.  Follows with oncology, Dr. Marin Olp who last saw her in clinic 10/21.  He was concerned about her low prealbumin suggesting poor prognosis and that he felt this likely indicated that she would not survive more than 6-8 weeks.  Daughter states that patient has been on several courses of chemotherapy.  She underwent radiation treatment last month.  Since then patient has had significant nausea and vomiting and has been unable to maintain any adequate oral intake.  She has been having uncontrolled abdominal pain as well as increased swelling in her abdomen and legs.  She last underwent paracentesis 07/09/21 with removal of 2.8 L fluid.  Patient was brought to the ED due to recurrent/worsening swelling to her legs and abdomen.  [facility] ED Course:  Initial vitals showed BP 126/90, pulse 134, RR 20, temp 98.5 F, SPO2 95% on room air.  Labs show WBC 6.9, hemoglobin 11.2, platelets 176,000, sodium 129, potassium 4.1, bicarb 25, BUN 13, creatinine 0.61, serum glucose 232, AST 21, ALT 17, alkaline phosphatase 60, total bilirubin 0.6, lactic acid 0.9, INR 1.8.  Blood  cultures collected and pending.  SARS-CoV-2 and influenza PCR's are negative.  2 view chest x-ray showed small right pleural effusion and associated atelectasis.  Right chest port catheter seen in place.  CT abdomen/pelvis without contrast showed 4.8 x 2.6 cm discrete mass involving pancreatic tail consistent with history of pancreatic adenocarcinoma.  Mild to moderate ascites noted in the pelvis and around the liver and spleen.  Large complex multicystic mass noted on pelvis likely representing ovarian metastatic disease or Krukenberg tumors per radiology read.  Significantly enlarged.  21 point is seen along the anterior abdominal wall consistent with peritoneal carcinomatosis.  Multiple metastatic lesions of hepatic parenchyma seen.  Increased right pleural effusion also noted with atelectasis of the right lower lobe.  Bilateral lower extremity venous ultrasound are negative for evidence of DVT.  Right-sided Baker's cyst and suspected complex left-sided Baker's cyst with internal debris and calcification noted.  The Hospitalist service was consulted to admit for further evaluation and management.  Review of Systems: All systems reviewed and are negative except as documented in history of present illness above.   Past Medical History:  Diagnosis Date   Diabetes mellitus without complication ([place])    Family history of brain cancer    Family history of breast cancer    Family history of kidney cancer    Family history of ovarian cancer    GERD (gastroesophageal reflux disease)    Hyperlipidemia    Hypertension    Kidney stone    Pancreatic cancer ([place]) 02/03/2020   Shingles    Type 2 diabetes mellitus ([place])     Past Surgical History:  Procedure Laterality Date   CESAREAN  SECTION     x 4   IR IMAGING GUIDED PORT INSERTION  02/18/2020   KIDNEY STONE SURGERY      Social History:  reports that she has never smoked. She has never used smokeless tobacco. She reports that she does not  drink alcohol and does not use drugs.  Allergies  Allergen Reactions   Ivp Dye [Iodinated Diagnostic Agents] Itching and Rash   Morphine And Related Hives   Penicillins Rash    Family History  Problem Relation Age of Onset   Heart attack Maternal Uncle    Breast cancer Paternal Aunt        dx. in her 79s   Brain cancer Paternal Uncle 33   Kidney cancer Cousin        dx. <50   Ovarian cancer Cousin    Breast cancer Cousin        dx. "young"   Ovarian cancer Cousin    Cancer Cousin        Cancer on the back, dx. in her 55s   Lung cancer Cousin      Prior to Admission medications   Medication Sig Start Date End Date Taking? Authorizing Provider  albuterol (PROVENTIL HFA;VENTOLIN HFA) 108 (90 Base) MCG/ACT inhaler Inhale 2 puffs into the lungs every 4 (four) hours as needed for wheezing or shortness of breath. Patient not taking: Reported on 07/05/2021 04/01/16   Janne Napoleon, NP  amLODipine (NORVASC) 5 MG tablet Take 5 mg by mouth daily. 06/17/21   [provider]  cetirizine (ZYRTEC) 10 MG tablet Take 1 tablet (10 mg total) by mouth daily. Patient not taking: Reported on 07/05/2021 04/06/20   Jacelyn Pi, Lilia Argue, MD  dexamethasone (DECADRON) 4 MG tablet Take 1 tablet (4 mg total) by mouth 2 (two) times daily with a meal. For three (3) days following chemotherapy. Patient not taking: Reported on 07/05/2021 03/26/21   Volanda Napoleon, MD  dicyclomine (BENTYL) 10 MG capsule Take 10 mg by mouth 4 (four) times daily. 06/03/21   [provider]  diphenoxylate-atropine (LOMOTIL) 2.5-0.025 MG tablet Take 2 tablets by mouth 4 (four) times daily as needed for diarrhea or loose stools. 06/23/21   Volanda Napoleon, MD  dronabinol (MARINOL) 2.5 MG capsule Take 1 capsule (2.5 mg total) by mouth 2 (two) times daily before lunch and supper. 06/23/21   Volanda Napoleon, MD  ergocalciferol (VITAMIN D2) 1.25 MG (50000 UT) capsule Take 1 capsule (50,000 Units total) by mouth once a week.  10/07/20   Just, Laurita Quint, FNP  fentaNYL (DURAGESIC) 75 MCG/HR Place 2 patches onto the skin every other day. 07/05/21   Volanda Napoleon, MD  gabapentin (NEURONTIN) 300 MG capsule TAKE 2 CAPSULES BY MOUTH IN THE DAYTIME AND 2 CAPSULES AT BEDTIME 12/08/20 12/08/21  Volanda Napoleon, MD  HYDROcodone bit-homatropine (HYCODAN) 5-1.5 MG/5ML syrup Take 5 mLs by mouth every 6 (six) hours as needed for cough. Patient not taking: Reported on 07/05/2021 03/04/21   Volanda Napoleon, MD  HYDROmorphone (DILAUDID) 4 MG tablet Take 1 tablet (4 mg total) by mouth every 4 (four) hours as needed for severe pain. 06/23/21   Volanda Napoleon, MD  Lactulose 20 GM/30ML SOLN Take 30 mLs (20 g total) by mouth 3 (three) times daily as needed. Patient not taking: Reported on 07/05/2021 05/07/21   Volanda Napoleon, MD  lidocaine-prilocaine (EMLA) cream APPLY TO PORT 1 HOUR BEFORE ACCESS. 02/17/21 02/17/22  Volanda Napoleon, MD  lisinopril (ZESTRIL) 10 MG tablet Take by mouth daily. 06/17/21   [provider]  LORazepam (ATIVAN) 0.5 MG tablet Take 0.5 mg every six hours as needed for nausea. 06/25/21   Volanda Napoleon, MD  morphine (ROXANOL) 20 MG/ML concentrated solution Take 1 mL (20 mg total) by mouth every 3 (three) hours as needed for severe pain. 07/09/21   Volanda Napoleon, MD  OLANZapine (ZYPREXA) 10 MG tablet Take 1 tablet (10 mg total) by mouth at bedtime. Take every day for nausea/vomiting 06/07/21   Volanda Napoleon, MD  omeprazole (PRILOSEC) 40 MG capsule Take 40 mg by mouth daily as needed (acid reflux). 04/22/21   [provider]  ondansetron (ZOFRAN) 4 MG tablet Take 1 tablet (4 mg total) by mouth every 6 (six) hours as needed for nausea. 07/13/21   Celso Amy, NP  polyethylene glycol (MIRALAX / GLYCOLAX) 17 g packet Take 17 g by mouth daily as needed. Patient not taking: Reported on 07/05/2021 02/18/20   Alma Friendly, MD  prochlorperazine (COMPAZINE) 10 MG tablet Take 1 tablet (10 mg total) by  mouth every 6 (six) hours as needed for nausea or vomiting. 05/27/21   Volanda Napoleon, MD  prochlorperazine (COMPAZINE) 25 MG suppository Unwrap and place 1 suppository (25 mg total) rectally every 8 (eight) hours as needed for nausea or vomiting. 07/05/21   Volanda Napoleon, MD  rivaroxaban (XARELTO) 20 MG TABS tablet Take 1 tablet (20 mg total) by mouth daily with supper. 05/12/21   Volanda Napoleon, MD  morphine 20 MG SUPP Place 1 suppository (20 mg total) rectally every 4 (four) hours as needed. 07/05/21   Volanda Napoleon, MD    Physical Exam: Vitals:   07/08/2021 1545 06/23/2021 1615 07/04/2021 1715 06/26/2021 1830  BP: 138/88 (!) 143/77 (!) 149/77 (!) 89/78  Pulse: (!) 127 (!) 123 (!) 123 (!) 124  Resp: (!) 24 (!) 24 (!) 23 14  Temp:    99 F (37.2 C)  TempSrc:    Oral  SpO2: 98% 97% 98% 99%  Weight:      Height:       Constitutional: Resting supine in bed, NAD, calm, comfortable Eyes: PERRL, lids and conjunctivae normal ENMT: Mucous membranes are very dry. Posterior pharynx clear of any exudate or lesions.Normal dentition.  Neck: normal, supple, no masses. Respiratory: Diminished breath sounds lung bases. Normal respiratory effort while on 2 L O2 via [place]. No accessory muscle use.  Cardiovascular: Regular rate and rhythm, no murmurs / rubs / gallops.  +3 bilateral lower extremity edema. Abdomen: Distended abdomen, generalized abdominal tenderness to palpation. Musculoskeletal: no clubbing / cyanosis. No joint deformity upper and lower extremities. Good ROM, no contractures. Normal muscle tone.  Skin: Bruise left anterior lower leg.  No rashes, lesions, ulcers. No induration Neurologic: CN 2-12 grossly intact. Sensation intact. Strength 5/5 in all 4.  Psychiatric: Alert and oriented x 3. Normal mood.   Labs on Admission: I have personally reviewed following labs and imaging studies  CBC: Recent Labs  Lab 07/09/21 1030 07/13/21 1011 07/05/2021 1340  WBC 8.7 7.5 6.9  NEUTROABS 6.1 4.4  5.9  HGB 11.8* 11.9* 11.2*  HCT 37.2 37.4 36.0  MCV 84.4 83.7 84.7  PLT 237 201 376   Basic Metabolic Panel: Recent Labs  Lab 07/09/21 1030 07/13/21 1011 07/02/2021 1340  NA 126* 130* 129*  K 4.4 3.9 4.1  CL 94* 95* 96*  CO2 25 28 25   GLUCOSE  178* 179* 232*  BUN 9 9 13   CREATININE 0.43* 0.46 0.61  CALCIUM 8.7* 8.7* 8.0*   GFR: Estimated Creatinine Clearance: 97.4 mL/min (by C-G formula based on SCr of 0.61 mg/dL). Liver Function Tests: Recent Labs  Lab 07/09/21 1030 07/13/21 1011 06/28/2021 1340  AST 14* 15 21  ALT 8 11 17   ALKPHOS 65 61 60  BILITOT 0.5 0.5 0.6  PROT 6.5 6.2* 5.8*  ALBUMIN 2.6* 2.5* 2.0*   No results for input(s): LIPASE, AMYLASE in the last 168 hours. No results for input(s): AMMONIA in the last 168 hours. Coagulation Profile: Recent Labs  Lab 06/22/2021 1340  INR 1.8*   Cardiac Enzymes: No results for input(s): CKTOTAL, CKMB, CKMBINDEX, TROPONINI in the last 168 hours. BNP (last 3 results) No results for input(s): PROBNP in the last 8760 hours. HbA1C: No results for input(s): HGBA1C in the last 72 hours. CBG: No results for input(s): GLUCAP in the last 168 hours. Lipid Profile: No results for input(s): CHOL, HDL, LDLCALC, TRIG, CHOLHDL, LDLDIRECT in the last 72 hours. Thyroid Function Tests: No results for input(s): TSH, T4TOTAL, FREET4, T3FREE, THYROIDAB in the last 72 hours. Anemia Panel: No results for input(s): VITAMINB12, FOLATE, FERRITIN, TIBC, IRON, RETICCTPCT in the last 72 hours. Urine analysis:    Component Value Date/Time   COLORURINE AMBER (A) 06/02/2021 1122   APPEARANCEUR CLOUDY (A) 06/02/2021 1122   LABSPEC 1.030 06/02/2021 1122   PHURINE 6.0 06/02/2021 1122   GLUCOSEU NEGATIVE 06/02/2021 1122   HGBUR NEGATIVE 06/02/2021 [place] 06/02/2021 1122   [place] 06/02/2021 1122   PROTEINUR NEGATIVE 06/02/2021 1122   UROBILINOGEN 1.0 11/07/2012 1050   NITRITE NEGATIVE 06/02/2021 1122    LEUKOCYTESUR NEGATIVE 06/02/2021 1122    Radiological Exams on Admission: CT ABDOMEN PELVIS WO CONTRAST  Result Date: 07/08/2021 CLINICAL DATA:  History of pancreatic cancer.  Abdominal distention. EXAM: CT ABDOMEN AND PELVIS WITHOUT CONTRAST TECHNIQUE: Multidetector CT imaging of the abdomen and pelvis was performed following the standard protocol without IV contrast. COMPARISON:  April 21, 2021. FINDINGS: Lower chest: Increased right pleural effusion is noted with associated atelectasis of the right lower lobe. Hepatobiliary: Heterogeneous appearance of hepatic parenchyma is noted suggesting diffuse metastatic disease as noted on prior exam. Some degree of hepatic steatosis may be present as well. Status post cholecystectomy. No definite biliary dilatation is noted. Pancreas: Solid mass is seen involving pancreatic tail measuring 4.8 x 2.6 cm consistent with history of pancreatic adenocarcinoma. No definite ductal dilatation is noted. Spleen: Normal in size without focal abnormality. Adrenals/Urinary Tract: Adrenal glands appear normal. Nonobstructive left nephrolithiasis is noted. No hydronephrosis or renal obstruction is noted. Urinary bladder is decompressed. Stomach/Bowel: The stomach appears normal. There is no evidence of bowel obstruction or inflammation. Vascular/Lymphatic: No significant vascular findings are present. No enlarged abdominal or pelvic lymph nodes. Reproductive: Uterus is unremarkable. 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 These are significantly enlarged compared to prior exam. Overall they measure 15.5 x 11.0 cm, and it is difficult to distinguish right from left. Other: There is interval development of mild to moderate ascites in the pelvis and around the liver and spleen. 7.9 x 5.0 cm mass is seen along the anterior abdominal wall in the region of the umbilicus which is enlarged compared  to prior exam and consistent with worsening peritoneal implant. Musculoskeletal: Grade 1 anterolisthesis of L4-5 is noted secondary to bilateral L5  spondylolysis. No acute osseous abnormality is noted. IMPRESSION: 4.8 x 2.6 cm solid discrete mass is seen involving pancreatic tail consistent with history of pancreatic adenocarcinoma. Mild to moderate ascites is noted both in the pelvis and around the liver and spleen in the upper abdomen. 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 This is significantly enlarged compared to prior exam. Significantly enlarged peritoneal implant is seen along the anterior abdominal wall in the periumbilical region consistent with peritoneal carcinomatosis. Heterogeneous appearance of hepatic parenchyma is noted consistent with multiple metastatic lesions. Some degree of hepatic steatosis may be present as well. Increased right pleural effusion is noted with associated atelectasis of the right lower lobe. Electronically Signed   By: Marijo Conception M.D.   On: 07/08/2021 14:56   DG Chest 2 View  Result Date: 06/19/2021 CLINICAL DATA:  Suspected sepsis EXAM: CHEST - 2 VIEW COMPARISON:  04/08/2020 FINDINGS: Low volume examination. Small right pleural effusion associated atelectasis or consolidation. The left lung is normally aerated. Cardiomegaly. Right chest port catheter. IMPRESSION: Low volume examination. Small right pleural effusion and associated atelectasis or consolidation. Electronically Signed   By: Delanna Ahmadi M.D.   On: 07/01/2021 14:37   US Venous Img Lower Bilateral (DVT)  Result Date: 07/12/2021 CLINICAL DATA:  Bilateral lower extremity edema and history of pancreatic cancer. EXAM: BILATERAL LOWER EXTREMITY VENOUS DOPPLER ULTRASOUND TECHNIQUE: Gray-scale sonography with graded compression, as well as color Doppler and duplex ultrasound were performed to evaluate the lower  extremity deep venous systems from the level of the common femoral vein and including the common femoral, femoral, profunda femoral, popliteal and calf veins including the posterior tibial, peroneal and gastrocnemius veins when visible. The superficial great saphenous vein was also interrogated. Spectral Doppler was utilized to evaluate flow at rest and with distal augmentation maneuvers in the common femoral, femoral and popliteal veins. COMPARISON:  None. FINDINGS: RIGHT LOWER EXTREMITY Common Femoral Vein: No evidence of thrombus. Normal compressibility, respiratory phasicity and response to augmentation. Saphenofemoral Junction: No evidence of thrombus. Normal compressibility and flow on color Doppler imaging. Profunda Femoral Vein: No evidence of thrombus. Normal compressibility and flow on color Doppler imaging. Femoral Vein: No evidence of thrombus. Normal compressibility, respiratory phasicity and response to augmentation. Popliteal Vein: No evidence of thrombus. Normal compressibility, respiratory phasicity and response to augmentation. Calf Veins: No evidence of thrombus. Normal compressibility and flow on color Doppler imaging. Superficial Great Saphenous Vein: No evidence of thrombus. Normal compressibility. Venous Reflux:  None. Other Findings: Baker's cyst within the popliteal fossa measures 3.8 by 1.1 x 2.3 cm. LEFT LOWER EXTREMITY Common Femoral Vein: No evidence of thrombus. Normal compressibility, respiratory phasicity and response to augmentation. Saphenofemoral Junction: No evidence of thrombus. Normal compressibility and flow on color Doppler imaging. Profunda Femoral Vein: No evidence of thrombus. Normal compressibility and flow on color Doppler imaging. Femoral Vein: No evidence of thrombus. Normal compressibility, respiratory phasicity and response to augmentation. Popliteal Vein: No evidence of thrombus. Normal compressibility, respiratory phasicity and response to augmentation. Calf Veins:  No evidence of thrombus. Normal compressibility and flow on color Doppler imaging. Superficial Great Saphenous Vein: No evidence of thrombus. Normal compressibility. Venous Reflux:  None. Other Findings: Complex fluid collection within the popliteal fossa measures 3.4 x 1.2 x 1.7 cm containing diffuse internal echoes and areas of calcification. IMPRESSION: 1. No evidence of deep venous thrombosis in either lower extremity. 2. Right-sided Baker's cyst. 3.  Complex fluid collection within the left popliteal fossa containing internal echoes and calcification. Favor complex Baker's cyst with internal debris and calcification. Electronically Signed   By: Kerby Moors M.D.   On: 06/29/2021 15:04    EKG: Not performed.  Assessment/Plan Principal Problem:   Acute respiratory failure with hypoxia (HCC) Active Problems:   Type 2 diabetes mellitus (HCC)   Pancreatic cancer (HCC)   Abdominal ascites   Pleural effusion on right   Hyponatremia   Angela Wilson is an Arabic speaking 55 y.o. female with medical history significant for metastatic pancreatic adenocarcinoma with malignant right pleural effusion and lymph node involvement on active chemotherapy, thrombus of the right IJ vein, type 2 diabetes, hypertension who is admitted for acute respiratory failure with hypoxia in setting of malignant pleural effusion and abdominal ascites.  Acute respiratory failure with hypoxia Right pleural effusion Abdominal ascites: Hypoxia secondary to right pleural effusion and diminished respiratory capacity from abdominal ascites.  Recent paracentesis 10/21 removed 2.8 L fluid. -IV Lasix 20 mg once tonight if BP allows -US paracentesis ordered for a.m. -Continue supplemental oxygen as needed  Metastatic pancreatic adenocarcinoma: Complicated by malignant pleural effusion, ascites, peritoneal carcinomatosis, hepatic metastases all of which are seen again on CT abdomen/pelvis in the ED.  Follows with oncology, Dr.  Marin Olp.  Thrombus right IJ vein: Continue Xarelto.  Hyponatremia: Chronic and secondary to hypervolemia, slightly decreased from baseline.  Continue to monitor.  Type 2 diabetes: Placed on sensitive SSI.  Hypertension: Hold antihypertensives due to low blood pressure on arrival.  Nausea/appetite/pain: Continue IV Zofran, Compazine, Marinol, Zyprexa, Ativan as needed.  Dilaudid as needed for pain with hold parameters.  Goals of care: I had a long discussion with patient's daughter at bedside at time of admission regarding patient's advanced metastatic pancreatic cancer and poor prognosis as indicated on Dr. Antonieta Pert last office note.  Daughter states that her mother's ultimate goal is to return to [place] to see her family before she dies.  At this time, patient is full code.  Daughter wants to discuss further with her family.  Palliative care consultation recommended.  DVT prophylaxis: Xarelto Code Status: Full code Family Communication: Discussed with patient's daughter at bedside Disposition Plan: From home, dispo pending clinical progress Consults called: None Level of care: Med-Surg Admission status:  Status is: Inpatient  Remains inpatient appropriate because: Continue management of acute respiratory failure with hypoxia in setting of advanced malignant pancreatic cancer complicated by malignant pleural effusion, abdominal ascites, peritoneal carcinomatosis, hepatic metastases with ongoing nausea and inability maintain adequate oral intake and uncontrolled cancer associated pain.  Patient is currently unsafe for discharge.  Zada Finders MD [facility]  If 7PM-7AM, please contact night-coverage [email or web address]  06/23/2021, 7:49 PM

## 2021-07-15 NOTE — Patient Instructions (Signed)
Tunneled Central Venous Catheter Flushing Guide It is important to flush your tunneled central venous catheter each time you use it, both before and after you use it. Flushing your catheter will help prevent it from clogging. What are the risks? Risks may include: Infection. Air getting into the catheter and bloodstream. Supplies needed: A clean pair of gloves. A disinfecting wipe. Use an alcohol wipe, chlorhexidine wipe, or iodine wipe as told by your health care provider. A 10 mL syringe that has been prefilled with saline solution. An empty 10 mL syringe, if a substance called heparin was injected into your catheter. How to flush your catheter When you flush your catheter, make sure you follow any specific instructions from your health care provider or the manufacturer. These are general guidelines. Flushing your catheter before use If there is heparin in your catheter: Wash your hands with soap and water. Put on gloves. Scrub the injection cap for a minimum of 15 seconds with a disinfecting wipe. Unclamp the catheter. Attach the empty syringe to the injection cap. Pull the syringe plunger back and withdraw 10 mL of blood. Place the syringe into an appropriate waste container. Scrub the injection cap for 15 seconds with a disinfecting wipe. Attach the prefilled syringe to the injection cap. Flush the catheter by pushing the plunger forward until all the liquid from the syringe is in the catheter. Remove the syringe from the injection cap. Clamp the catheter. If there is no heparin in your catheter: Wash your hands with soap and water. Put on gloves. Scrub the injection cap for 15 seconds with a disinfecting wipe. Unclamp the catheter. Attach the prefilled syringe to the injection cap. Flush the catheter by pushing the plunger forward until 5 mL of the liquid from the syringe is in the catheter. Pull back on the syringe until you see blood in the catheter. If you have been asked  to collect any blood, follow your health care provider's instructions. Otherwise, flush the catheter with the rest of the solution from the syringe. Remove the syringe from the injection cap. Clamp the catheter.  Flushing your catheter after use Wash your hands with soap and water. Put on gloves. Scrub the injection cap for 15 seconds with a disinfecting wipe. Unclamp the catheter. Attach the prefilled syringe to the injection cap. Flush the catheter by pushing the plunger forward until all of the liquid from the syringe is in the catheter. Remove the syringe from the injection cap. Clamp the catheter. Problems and solutions If blood cannot be completely cleared from the injection cap, you may need to have the injection cap replaced. If the catheter is difficult to flush, use the pulsing method. The pulsing method involves pushing only a few milliliters of solution into the catheter at a time and pausing between pushes. If you do not see blood in the catheter when you pull back on the syringe, change your body position, such as by raising your arms above your head. Take a deep breath and cough. Then, pull back on the syringe. If you still do not see blood, flush the catheter with a small amount of solution. Then, change positions again and take a breath or cough. Pull back on the syringe again. If you still do not see blood, finish flushing the catheter and contact your health care provider. Do not use your catheter until your health care provider says it is okay. General tips Have someone help you flush your catheter, if possible. Do not force fluid   through your catheter. Do not use a syringe that is larger or smaller than 10 mL. Using a smaller syringe can make the catheter burst. Do not use your catheter without flushing it first if it has heparin in it. Contact a health care provider if: You cannot see any blood in the catheter when you flush it before using it. Your catheter is difficult  to flush. Get help right away if: You cannot flush the catheter. The catheter leaks when you flush it or when there is fluid in it. There are cracks or breaks in the catheter. Summary It is important to flush your tunneled central venous catheter each time you use it, both before and after you use it. Scrub the injection cap for 15 seconds with a disinfecting wipe before and after you flush it. When you flush your catheter, make sure you follow any specific instructions from your health care provider or the manufacturer. Get help right away if you cannot flush the catheter. This information is not intended to replace advice given to you by your health care provider. Make sure you discuss any questions you have with your health care provider. Document Revised: 11/14/2019 Document Reviewed: 11/21/2018 Elsevier Patient Education  2022 [employer]  

## 2021-07-16 ENCOUNTER — Inpatient Hospital Stay (HOSPITAL_COMMUNITY): Payer: 59

## 2021-07-16 DIAGNOSIS — R180 Malignant ascites: Secondary | ICD-10-CM

## 2021-07-16 DIAGNOSIS — R531 Weakness: Secondary | ICD-10-CM | POA: Diagnosis not present

## 2021-07-16 DIAGNOSIS — J9601 Acute respiratory failure with hypoxia: Secondary | ICD-10-CM | POA: Diagnosis not present

## 2021-07-16 DIAGNOSIS — Z515 Encounter for palliative care: Secondary | ICD-10-CM

## 2021-07-16 DIAGNOSIS — Z7189 Other specified counseling: Secondary | ICD-10-CM

## 2021-07-16 DIAGNOSIS — I82C21 Chronic embolism and thrombosis of right internal jugular vein: Secondary | ICD-10-CM

## 2021-07-16 DIAGNOSIS — J90 Pleural effusion, not elsewhere classified: Secondary | ICD-10-CM | POA: Diagnosis not present

## 2021-07-16 LAB — COMPREHENSIVE METABOLIC PANEL
ALT: 16 U/L (ref 0–44)
AST: 18 U/L (ref 15–41)
Albumin: 2.1 g/dL — ABNORMAL LOW (ref 3.5–5.0)
Alkaline Phosphatase: 56 U/L (ref 38–126)
Anion gap: 5 (ref 5–15)
BUN: 13 mg/dL (ref 6–20)
CO2: 28 mmol/L (ref 22–32)
Calcium: 8 mg/dL — ABNORMAL LOW (ref 8.9–10.3)
Chloride: 99 mmol/L (ref 98–111)
Creatinine, Ser: 0.49 mg/dL (ref 0.44–1.00)
GFR, Estimated: >60 mL/min (ref >60)
Glucose, Bld: 235 mg/dL — ABNORMAL HIGH (ref 70–99)
Potassium: 3.9 mmol/L (ref 3.5–5.1)
Sodium: 132 mmol/L — ABNORMAL LOW (ref 135–145)
Total Bilirubin: 0.9 mg/dL (ref 0.3–1.2)
Total Protein: 6 g/dL — ABNORMAL LOW (ref 6.5–8.1)

## 2021-07-16 LAB — CBC
HCT: 37.7 % (ref 36.0–46.0)
Hemoglobin: 11.5 g/dL — ABNORMAL LOW (ref 12.0–15.0)
MCH: 26.4 pg (ref 26.0–34.0)
MCHC: 30.5 g/dL (ref 30.0–36.0)
MCV: 86.5 fL (ref 80.0–100.0)
Platelets: 195 10*3/uL (ref 150–400)
RBC: 4.36 MIL/uL (ref 3.87–5.11)
RDW: 17.6 % — ABNORMAL HIGH (ref 11.5–15.5)
WBC: 8.6 10*3/uL (ref 4.0–10.5)
nRBC: 0 % (ref 0.0–0.2)

## 2021-07-16 LAB — GLUCOSE, CAPILLARY
Glucose-Capillary: 236 mg/dL — ABNORMAL HIGH (ref 70–99)
Glucose-Capillary: 206 mg/dL — ABNORMAL HIGH (ref 70–99)
Glucose-Capillary: 178 mg/dL — ABNORMAL HIGH (ref 70–99)
Glucose-Capillary: 172 mg/dL — ABNORMAL HIGH (ref 70–99)

## 2021-07-16 LAB — HIV ANTIBODY (ROUTINE TESTING W REFLEX): HIV Screen 4th Generation wRfx: NONREACTIVE

## 2021-07-16 MED ORDER — SODIUM CHLORIDE 0.9% FLUSH: 10.0000 mL | INTRAVENOUS | Status: DC | PRN

## 2021-07-16 MED ORDER — LIDOCAINE HCL 1 % IJ SOLN
INTRAMUSCULAR | Status: AC
  Filled 2021-07-16: qty 20

## 2021-07-16 MED ORDER — CHLORHEXIDINE GLUCONATE CLOTH 2 % EX PADS
6.0000 | MEDICATED_PAD | Freq: Every day | CUTANEOUS | Status: DC
  Administered 2021-07-16 – 2021-08-03 (×17): 6 via TOPICAL

## 2021-07-16 NOTE — Assessment & Plan Note (Signed)
Continue Xarelto 

## 2021-07-16 NOTE — Assessment & Plan Note (Addendum)
-   Patient and family have now had multiple [place] discussions notably on admission with the admitting provider followed by myself today as well as with oncology and palliative care.  Unfortunately due to the patient's religious beliefs, they do not wish for any de-escalation of care at this time and wish to continue remaining full code.  I have directly informed her daughter that the patient's prognosis is poor and she may not survive hospitalization.   -Greatly appreciate palliative care assistance as well with the management of this patient - Daughter has spoke with Dr. Marin Olp on 07/19/2021, no further chemo was offered and prognosis was reiterated to be poor.  Main focus at this time is still centered around keeping patient comfortable as she is still actively approaching end-of-life.

## 2021-07-16 NOTE — Assessment & Plan Note (Addendum)
-   due to peritoneal carcinomatosis from pancreatic cancer - last paracentesis performed on 10/21 with 2.8 L removed - now back with recurrent abdominal pain/distension from fluid re-accumulation -Patient underwent repeat paracentesis on 07/16/2021 which removed 3.4 L fluid -I have informed daughter that this will also again continue to reaccumulate - abdomen already distended again on 11/1 with more discomfort on exam; likely would need repeat paracentesis in next 2-3 days

## 2021-07-16 NOTE — Assessment & Plan Note (Signed)
-   likely due to underlying cancer and/or poor intake - hold off in IVF due to high risk of 3rd spacing and further worsening of respiratory status and ascites

## 2021-07-16 NOTE — Progress Notes (Signed)
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 She has undergone extensive treatment since initial diagnosis in May 2021.  She is followed outpatient by Dr. Marin Olp. Despite treatment, her cancer seems to have progressed and lately she does not appear to be tolerating treatment from a physical ability standpoint.  Her appetite has decreased and she has developed generalized edema with recurrent abdominal ascites and now with enlarging right pleural effusion seen on imaging.  Due to religious beliefs, patient and family continue to want full scope of care without consideration of scaling back treatment in efforts to focus on comfort.   Assessment and Plan * Acute respiratory failure with hypoxia (HCC) - due to large R pleural effusion wish is presumed malignant as well - continue O2 - hold off on thoracentesis for now as patient not in any respiratory distress and fluid likely to re-accumulate; I have also informed daughter of this  Abdominal ascites - due to peritoneal carcinomatosis from pancreatic cancer - last paracentesis performed on 10/21 with 2.8 L removed - now back with recurrent abdominal pain/distension from fluid re-accumulation - I have tried explaining to daughter that further aggressive use of lasix will only start to cause further organ damage probably to kidneys and she most definitely would not be an HD candidate  -Paracentesis ordered on admission.  Other considered treatment could be Pleurx catheter placement in the abdomen but this would also be something more in line with a palliative care/hospice approach which family is currently against  Stage IV  adenocarcinoma of pancreas ([place]) - diagnosed 02/14/20 - s/p multiple chemo regimens and also saw oncology, Dr. Fanny Skates, at [facility] for 2nd opinion; liposomal irinotecan was proposed but according to oncology notes this has been denied by insurance previously - furthermore, her functional and physical status is so poor, I highly doubt she is a good candidate for any further treatment other than focusing on comfort at this time; family wishes to continue discussing this with Dr. Marin Olp - palliative care consulted given patient's large decline; due to religious beliefs they wish to continue full code at this time. I've directly informed patient's daughter that the patient is imminently approaching end of life  Goals of care, counseling/discussion - Patient and family have now had multiple [place] discussions notably on admission with the admitting provider followed by myself today as well as with oncology and palliative care.  Unfortunately due to the patient's religious beliefs, they do not wish for any de-escalation of care at this time and wish to continue remaining full code.  I have directly informed her daughter that the patient's prognosis is poor and she may not survive hospitalization.  I do perceive some denial on family's part however in accordance with her wishes, we will continue current scope in place. -Follow-up oncology recommendations and palliative care consult  Pleural effusion on right - See respiratory failure - Continue oxygen  Hyponatremia - likely due to underlying cancer and/or poor intake - hold off in IVF due to high risk of 3rd spacing and further worsening of respiratory status and ascites   Internal jugular (IJ) vein thromboembolism, chronic, right (HCC) - Continue Xarelto  Essential hypertension, benign - BP meds  on hold on admission due to low/normal blood pressure - Continue holding especially if further diuretic use and/or with further volume being removed on  paracentesis  Type 2 diabetes mellitus ([place]) - continue SSI and CBG monitoring      Subjective:  Long discussion bedside with her daughter this morning. Patient is very clearly not thriving well and doing poorly recently especially as recent chemo. Despite me trying to point this out to her daughter, the focus was on other topics such as "continuing chemo", "finding a new treatment", "give more lasix", "fix nutrition", "drain the fluid". I tried pointing out the "big picture" but it was not perceived as such.  The patient was otherwise very weak and lethargic in bed. Complaining of abdominal pain/distension. Had no appetite still and otherwise was laying there while her daughter was mostly the means of communication on her part.   Objective Vitals:   06/29/2021 2040 06/19/2021 2230 07/16/21 0223 07/16/21 0633  BP: 134/84 (!) 143/84 137/83 138/80  Pulse: (!) 120 (!) 125 (!) 122 (!) 126  Resp: 20 19 17 19   Temp: 98.1 F (36.7 C) 98 F (36.7 C) 98.8 F (37.1 C) 98.3 F (36.8 C)  TempSrc: Oral Oral Oral Oral  SpO2: 98% 95% 98% 97%  Weight:      Height:       100.7 kg  Vital signs were reviewed and unremarkable.   Exam Physical Exam Constitutional:      Comments: Chronically ill, fatigued, frail woman laying in bed appearing uncomfortable  HENT:     Head: Normocephalic and atraumatic.     Comments: O2 [place] in place    Mouth/Throat:     Mouth: Mucous membranes are dry.  Eyes:     Extraocular Movements: Extraocular movements intact.  Cardiovascular:     Rate and Rhythm: Normal rate and regular rhythm.  Pulmonary:     Effort: Pulmonary effort is normal. No respiratory distress.     Breath sounds: No wheezing.  Abdominal:     Comments: Distended, obese, soft, nonspecific TTP.   Musculoskeletal:     Cervical back: Normal range of motion and neck supple.     Comments: 2-3+ B/L LE pitting edema  Skin:    General: Skin is warm and dry.  Neurological:     General: No focal  deficit present.  Psychiatric:        Behavior: Behavior normal.     Labs / Other Information My review of labs, imaging, notes and other tests is significant for large R pleural effusion, large abdominal ascites, multiple lab abnormalities      Disposition Plan: Status is: Inpatient  Remains inpatient appropriate because: treatment of above    Time spent: Greater than 50% of the 35 minute visit was spent in counseling/coordination of care for the patient as laid out in the A&P.  Dwyane Dee, MD [facility] 07/16/2021, 2:06 PM

## 2021-07-16 NOTE — Procedures (Signed)
Ultrasound-guided therapeutic paracentesis performed yielding 3.4 liters of yellow  fluid. No immediate complications. EBL none.   

## 2021-07-16 NOTE — Assessment & Plan Note (Signed)
-   See respiratory failure - Continue oxygen

## 2021-07-16 NOTE — Assessment & Plan Note (Signed)
-   BP meds on hold on admission due to low/normal blood pressure - Continue holding especially if further diuretic use and/or with further volume being removed on paracentesis

## 2021-07-16 NOTE — Consult Note (Signed)
Consultation Note Date: 07/16/2021   Patient Name: Angela Wilson  DOB: Aug 17, 1966  MRN: [id number]  Age / Sex: 55 y.o., female  PCP: Just, Laurita Quint, FNP (Inactive) Referring Physician: Dwyane Dee, MD  Reason for Consultation: Establishing goals of care  HPI/Patient Profile: 55 y.o. female   admitted on 06/22/2021 with  .  Angela Wilson is an Arabic speaking 55 y.o. female with medical history significant for metastatic pancreatic adenocarcinoma with malignant right pleural effusion and lymph node involvement on active chemotherapy, thrombus of the right IJ vein, type 2 diabetes, hypertension who presented to the ED for evaluation of leg swelling and vomiting. Clinical Assessment and Goals of Care:  Patient designates her daughter to be her spokesperson and interpretor. A palliative consult has been requested for goals of care discussions.   Patient seen and examined. Discussed with daughter at bedside. I introduced myself and palliative care as follows: Palliative medicine is specialized medical care for people living with serious illness. It focuses on providing relief from the symptoms and stress of a serious illness. The goal is to improve quality of life for both the patient and the family. Goals of care: Broad aims of medical therapy in relation to the patient's values and preferences. Our aim is to provide medical care aimed at enabling patients to achieve the goals that matter most to them, given the circumstances of their particular medical situation and their constraints.   Patient's daughter recalls her cancer history. She states that the patient is divorced from her husband, is originally from [place], has 4 children.  Patient and daughter wish to continue with full code status. They decline DNR DNI at this time, it it not consistent with their faith and wishes.   See below.   NEXT OF  KIN Daughter Angela Wilson.    SUMMARY OF RECOMMENDATIONS    FULL CODE, FULL SCOPE.  Patient designates her daughter Angela Wilson to be her HCPOA agent and her spokesperson. Angela Wilson is the patient's primary caregiver, patient lives with her daughter.  Continue current mode of care.  Patient and family wish to continue any further chemotherapy options if offered, they were discussing this with Dr Angela Wilson last week in office.  Patient and family wish to continue efforts at improving nutrition status as much as possible.  We spent some time talking about her serious life limiting illness and markedly limited prognosis and high risk for ongoing decline, decompensation and death. Patient and family are well aware, remains full code for now.  Will provide letter to patient's daughter for the rest of her children to be able to come see her in the [place] as her condition continues to decline.  Thank you for the consult.  Code Status/Advance Care Planning: Full code   Symptom Management:     Palliative Prophylaxis:  Delirium Protocol  Additional Recommendations (Limitations, Scope, Preferences): Full Scope Treatment  Psycho-social/Spiritual:  Desire for further Chaplaincy support:yes Additional Recommendations: Education on Hospice  Prognosis:  Unable to determine  Discharge Planning: To Be Determined  Primary Diagnoses: Present on Admission:  Acute respiratory failure with hypoxia (HCC)  Pancreatic cancer (HCC)  Abdominal ascites  Pleural effusion on right  Hyponatremia   I have reviewed the medical record, interviewed the patient and family, and examined the patient. The following aspects are pertinent.  Past Medical History:  Diagnosis Date   Diabetes mellitus without complication ([place])    Family history of brain cancer    Family history of breast cancer    Family history of kidney cancer    Family history of ovarian cancer    GERD (gastroesophageal reflux disease)    Hyperlipidemia     Hypertension    Kidney stone    Pancreatic cancer ([place]) 02/03/2020   Shingles    Type 2 diabetes mellitus ([place])    Social History   Socioeconomic History   Marital status: Divorced    Spouse name: Not on file   Number of children: Not on file   Years of education: Not on file   Highest education level: Not on file  Occupational History   Not on file  Tobacco Use   Smoking status: Never   Smokeless tobacco: Never  Vaping Use   Vaping Use: Never used  Substance and Sexual Activity   Alcohol use: No   Drug use: Never   Sexual activity: Not on file  Other Topics Concern   Not on file  Social History Narrative   ** Merged History Encounter **       Social Determinants of Health   Financial Resource Strain: Not on file  Food Insecurity: Not on file  Transportation Needs: Not on file  Physical Activity: Not on file  Stress: Not on file  Social Connections: Not on file   Family History  Problem Relation Age of Onset   Heart attack Maternal Uncle    Breast cancer Paternal Aunt        dx. in her 74s   Brain cancer Paternal Uncle 81   Kidney cancer Cousin        dx. <50   Ovarian cancer Cousin    Breast cancer Cousin        dx. "young"   Ovarian cancer Cousin    Cancer Cousin        Cancer on the back, dx. in her 40s   Lung cancer Cousin    Scheduled Meds:  Chlorhexidine Gluconate Cloth  6 each Topical Daily   dicyclomine  10 mg Oral QID   dronabinol  2.5 mg Oral BID AC   insulin aspart  0-9 Units Subcutaneous TID WC   mouth rinse  15 mL Mouth Rinse BID   OLANZapine  10 mg Oral QHS   rivaroxaban  20 mg Oral Q supper   sodium chloride flush  3 mL Intravenous Q12H   Continuous Infusions: PRN Meds:.acetaminophen **OR** acetaminophen, HYDROmorphone, LORazepam, ondansetron **OR** ondansetron (ZOFRAN) IV, prochlorperazine, senna-docusate, sodium chloride flush Medications Prior to Admission:  Prior to Admission medications   Medication Sig Start Date End Date  Taking? Authorizing Provider  albuterol (PROVENTIL HFA;VENTOLIN HFA) 108 (90 Base) MCG/ACT inhaler Inhale 2 puffs into the lungs every 4 (four) hours as needed for wheezing or shortness of breath. 04/01/16  Yes Mabe, Shanon Brow, NP  dicyclomine (BENTYL) 10 MG capsule Take 10 mg by mouth daily as needed for spasms. 06/03/21  Yes [provider]  diphenoxylate-atropine (LOMOTIL) 2.5-0.025 MG tablet Take 2 tablets by mouth 4 (four) times daily as needed for diarrhea or loose stools.  06/23/21  Yes Volanda Napoleon, MD  fentaNYL (DURAGESIC) 75 MCG/HR Place 2 patches onto the skin every other day. Patient taking differently: Place 1-2 patches onto the skin every other day. 07/05/21  Yes Ennever, Rudell Cobb, MD  gabapentin (NEURONTIN) 300 MG capsule TAKE 2 CAPSULES BY MOUTH IN THE DAYTIME AND 2 CAPSULES AT BEDTIME Patient taking differently: Take 600 mg by mouth 3 (three) times daily. 12/08/20 12/08/21 Yes Ennever, Rudell Cobb, MD  HYDROcodone bit-homatropine (HYCODAN) 5-1.5 MG/5ML syrup Take 5 mLs by mouth every 6 (six) hours as needed for cough. 03/04/21  Yes Ennever, Rudell Cobb, MD  HYDROmorphone (DILAUDID) 4 MG tablet Take 1 tablet (4 mg total) by mouth every 4 (four) hours as needed for severe pain. 06/23/21  Yes Volanda Napoleon, MD  Lactulose 20 GM/30ML SOLN Take 30 mLs (20 g total) by mouth 3 (three) times daily as needed. Patient taking differently: Take 30 mLs by mouth 3 (three) times daily as needed (for constipation). 05/07/21  Yes Volanda Napoleon, MD  lidocaine-prilocaine (EMLA) cream APPLY TO PORT 1 HOUR BEFORE ACCESS. Patient taking differently: Apply 1 application topically See admin instructions. Apply to port 1 hour before access 02/17/21 02/17/22 Yes Ennever, Rudell Cobb, MD  LORazepam (ATIVAN) 0.5 MG tablet Take 0.5 mg every six hours as needed for nausea. 06/25/21  Yes Ennever, Rudell Cobb, MD  OLANZapine (ZYPREXA) 10 MG tablet Take 1 tablet (10 mg total) by mouth at bedtime. Take every day for nausea/vomiting  06/07/21  Yes Ennever, Rudell Cobb, MD  omeprazole (PRILOSEC) 40 MG capsule Take 40 mg by mouth daily as needed (acid reflux). 04/22/21  Yes [provider]  ondansetron (ZOFRAN) 4 MG tablet Take 1 tablet (4 mg total) by mouth every 6 (six) hours as needed for nausea. 07/13/21  Yes Celso Amy, NP  rivaroxaban (XARELTO) 20 MG TABS tablet Take 1 tablet (20 mg total) by mouth daily with supper. 05/12/21  Yes Volanda Napoleon, MD  amLODipine (NORVASC) 5 MG tablet Take 5 mg by mouth daily. Patient not taking: No sig reported 06/17/21   [provider]  cetirizine (ZYRTEC) 10 MG tablet Take 1 tablet (10 mg total) by mouth daily. Patient not taking: Reported on 06/24/2021 04/06/20   Jacelyn Pi, Lilia Argue, MD  dexamethasone (DECADRON) 4 MG tablet Take 1 tablet (4 mg total) by mouth 2 (two) times daily with a meal. For three (3) days following chemotherapy. 03/26/21   Volanda Napoleon, MD  dronabinol (MARINOL) 2.5 MG capsule Take 1 capsule (2.5 mg total) by mouth 2 (two) times daily before lunch and supper. 06/23/21   Volanda Napoleon, MD  ergocalciferol (VITAMIN D2) 1.25 MG (50000 UT) capsule Take 1 capsule (50,000 Units total) by mouth once a week. Patient not taking: Reported on 07/16/2021 10/07/20   Just, Laurita Quint, FNP  lisinopril (ZESTRIL) 10 MG tablet Take 10 mg by mouth daily. Patient not taking: Reported on 07/14/2021 06/17/21   [provider]  morphine (ROXANOL) 20 MG/ML concentrated solution Take 1 mL (20 mg total) by mouth every 3 (three) hours as needed for severe pain. 07/09/21   Volanda Napoleon, MD  polyethylene glycol (MIRALAX / GLYCOLAX) 17 g packet Take 17 g by mouth daily as needed. Patient not taking: No sig reported 02/18/20   Alma Friendly, MD  prochlorperazine (COMPAZINE) 10 MG tablet Take 1 tablet (10 mg total) by mouth every 6 (six) hours as needed for nausea or vomiting. 05/27/21  Volanda Napoleon, MD  prochlorperazine (COMPAZINE) 25 MG suppository Unwrap and  place 1 suppository (25 mg total) rectally every 8 (eight) hours as needed for nausea or vomiting. 07/05/21   Volanda Napoleon, MD  morphine 20 MG SUPP Place 1 suppository (20 mg total) rectally every 4 (four) hours as needed. 07/05/21   Volanda Napoleon, MD   Allergies  Allergen Reactions   Ivp Dye [Iodinated Diagnostic Agents] Itching and Rash   Morphine And Related Hives   Penicillins Rash   Review of Systems +abdominal discomfort +swelling in both legs  Physical Exam Appears weak, resting in chair Abdomen distended Regular work of breathing Has edema both LE Awake alert  Vital Signs: BP 138/80 (BP Location: Left Arm)   Pulse (!) 126   Temp 98.3 F (36.8 C) (Oral)   Resp 19   Ht 5\' 6"  (1.676 m)   Wt 100.7 kg   SpO2 97%   BMI 35.83 kg/m  Pain Scale: 0-10   Pain Score: 0-No pain   SpO2: SpO2: 97 % O2 Device:SpO2: 97 % O2 Flow Rate: .O2 Flow Rate (L/min): 2 L/min  IO: Intake/output summary:  Intake/Output Summary (Last 24 hours) at 07/16/2021 1047 Last data filed at 07/16/2021 0200 Gross per 24 hour  Intake 120 ml  Output 650 ml  Net -530 ml    LBM: Last BM Date:  (PTA) Baseline Weight: Weight: 100.7 kg Most recent weight: Weight: 100.7 kg     Palliative Assessment/Data:   PPS 40%  Time In:  9.30 Time Out:  10.30 Time Total:  60  Greater than 50%  of this time was spent counseling and coordinating care related to the above assessment and plan.  Signed by: Loistine Chance, MD   Please contact Palliative Medicine Team phone at [phone] for questions and concerns.  For individual provider: See Shea Evans

## 2021-07-16 NOTE — Progress Notes (Addendum)
HEMATOLOGY-ONCOLOGY PROGRESS NOTE  SUBJECTIVE: The patient is followed by Dr. Marin Olp for metastatic pancreatic adenocarcinoma.  She has received several lines of chemotherapy and most recently has been on 5-FU/liposomal Irinotecan.  The patient was admitted overnight with lower extremity edema, abdominal pain, vomiting.  The patient's daughter is at the bedside and assists with translation.  She tells me that the patient has had significant swelling in her legs but feels that she responded well to receiving Lasix in the emergency department.  Her daughter also tells me that she has had a very poor appetite and has had vomiting since receiving radiation.  The patient has abdominal distention this morning.  Awaiting paracentesis.  Oncology History  Pancreatic cancer ([place])  02/14/2020 Initial Diagnosis   Pancreatic cancer ([place])   02/25/2020 - 11/10/2020 Chemotherapy          03/24/2020 Genetic Testing   Negative genetic testing:  No pathogenic variants detected on the [facility] Common Hereditary Cancers Panel + Pancreatic Cancer Panel + Pancreatitis Genes. Two variants of uncertain significance (VUS) were detected - one in the AXIN2 gene called c.1855G>A and a second in the MUTYH gene called c.932G>A. The report date is 03/24/2020.  The Common Hereditary Cancers Panel offered by [facility] includes sequencing and/or deletion duplication testing of the following 48 genes: APC, ATM, AXIN2, BARD1, BMPR1A, BRCA1, BRCA2, BRIP1, CDH1, CDK4, CDKN2A (p14ARF), CDKN2A (p16INK4a), CHEK2, CTNNA1, DICER1, EPCAM (Deletion/duplication testing only), GREM1 (promoter region deletion/duplication testing only), KIT, MEN1, MLH1, MSH2, MSH3, MSH6, MUTYH, NBN, NF1, NTHL1, PALB2, PDGFRA, PMS2, POLD1, POLE, PTEN, RAD50, RAD51C, RAD51D, RNF43, SDHB, SDHC, SDHD, SMAD4, SMARCA4. STK11, TP53, TSC1, TSC2, and VHL.  The following genes were evaluated for sequence changes only: SDHA and HOXB13 c.251G>A variant only.  The [facility] Pancreatic  Cancer Panel with Chronic Pancreatitis Genes includes sequencing and/or deletion duplication analysis of the following 26 genes: APC, ATM, BMPR1A, BRCA1, BRCA2, CDKN2A, EPCAM, MEN1, MLH1, MSH2, MSH6, NF1, PALB2, PMS2, SMAD4, STK11, TP53, TSC1, TSC2, VHL, CASR, CFTR, CPA1, CTRC, PRSS1, and SPINK1. The [facility] and PALLD genes were also analyzed.   11/25/2020 - 01/27/2021 Chemotherapy          02/17/2021 - 02/17/2021 Chemotherapy          02/17/2021 - 02/19/2021 Chemotherapy          03/04/2021 - 04/15/2021 Chemotherapy          04/27/2021 - 06/02/2021 Chemotherapy   Patient is on Treatment Plan : PANCREAS 5-FU X5,1,70,01,74,94 q56d     07/13/2021 -  Chemotherapy   Patient is on Treatment Plan : PANCREAS Liposomal Irinotecan / Leucovorin / 5-FU  q14d        REVIEW OF SYSTEMS:   Constitutional: Denies fevers, chills  Eyes: Denies blurriness of vision Ears, nose, mouth, throat, and face: Denies mucositis or sore throat Respiratory: She has some shortness of due to increased abdominal distention Cardiovascular: Denies palpitation, chest discomfort Gastrointestinal: The patient's abdomen is distended, she is having nausea and vomiting Skin: Denies abnormal skin rashes Lymphatics: Denies new lymphadenopathy or easy bruising Neurological:Denies numbness, tingling or new weaknesses Behavioral/Psych: Mood is stable, no new changes  Extremities: Has increased bilateral lower extremity edema, right greater than left All other systems were reviewed with the patient and are negative.  I have reviewed the past medical history, past surgical history, social history and family history with the patient and they are unchanged from previous note.   PHYSICAL EXAMINATION: ECOG PERFORMANCE STATUS: 3 - Symptomatic, >50% confined to bed  Vitals:   07/16/21 0223 07/16/21 0633  BP: 137/83 138/80  Pulse: (!) 122 (!) 126  Resp: 17 19  Temp: 98.8 F (37.1 C) 98.3 F (36.8 C)  SpO2: 98% 97%   Filed  Weights   06/24/2021 1304  Weight: 100.7 kg    Intake/Output from previous day: 10/27 0701 - 10/28 0700 In: 120 [P.O.:120] Out: 650 [Urine:650]  GENERAL: Chronically ill-appearing female, sitting in recliner SKIN: skin color, texture, turgor are normal, no rashes or significant lesions EYES: normal, Conjunctiva are pink and non-injected, sclera clear OROPHARYNX:no exudate, no erythema and lips, buccal mucosa, and tongue normal  LUNGS: clear to auscultation and percussion with normal breathing effort HEART: regular rate & rhythm and no murmurs, pitting edema in the bilateral lower extremities, right greater than left ABDOMEN: Abdomen distended with ascites present NEURO: alert & oriented x 3 with fluent speech, no focal motor/sensory deficits  LABORATORY DATA:  I have reviewed the data as listed CMP Latest Ref Rng & Units 07/16/2021 07/06/2021 07/13/2021  Glucose 70 - 99 mg/dL 235(H) 232(H) 179(H)  BUN 6 - 20 mg/dL _0 Creatinine 0.44 - 1.00 mg/dL 0.49 0.61 0.46  Sodium 135 - 145 mmol/L 132(L) 129(L) 130(L)  Potassium 3.5 - 5.1 mmol/L 3.9 4.1 3.9  Chloride 98 - 111 mmol/L 99 96(L) 95(L)  CO2 22 - 32 mmol/L _1 Calcium 8.9 - 10.3 mg/dL 8.0(L) 8.0(L) 8.7(L)  Total Protein 6.5 - 8.1 g/dL 6.0(L) 5.8(L) 6.2(L)  Total Bilirubin 0.3 - 1.2 mg/dL 0.9 0.6 0.5  Alkaline Phos 38 - 126 U/L 56 60 61  AST 15 - 41 U/L _2 ALT 0 - 44 U/L _3 Lab Results  Component Value Date   WBC 8.6 07/16/2021   HGB 11.5 (L) 07/16/2021   HCT 37.7 07/16/2021   MCV 86.5 07/16/2021   PLT 195 07/16/2021   NEUTROABS 5.9 07/05/2021    CT ABDOMEN PELVIS WO CONTRAST  Result Date: 06/20/2021 CLINICAL DATA:  History of pancreatic cancer.  Abdominal distention. EXAM: CT ABDOMEN AND PELVIS WITHOUT CONTRAST TECHNIQUE: Multidetector CT imaging of the abdomen and pelvis was performed following the standard protocol without IV contrast. COMPARISON:  April 21, 2021. FINDINGS: Lower chest:  Increased right pleural effusion is noted with associated atelectasis of the right lower lobe. Hepatobiliary: Heterogeneous appearance of hepatic parenchyma is noted suggesting diffuse metastatic disease as noted on prior exam. Some degree of hepatic steatosis may be present as well. Status post cholecystectomy. No definite biliary dilatation is noted. Pancreas: Solid mass is seen involving pancreatic tail measuring 4.8 x 2.6 cm consistent with history of pancreatic adenocarcinoma. No definite ductal dilatation is noted. Spleen: Normal in size without focal abnormality. Adrenals/Urinary Tract: Adrenal glands appear normal. Nonobstructive left nephrolithiasis is noted. No hydronephrosis or renal obstruction is noted. Urinary bladder is decompressed. Stomach/Bowel: The stomach appears normal. There is no evidence of bowel obstruction or inflammation. Vascular/Lymphatic: No significant vascular findings are present. No enlarged abdominal or pelvic lymph nodes. Reproductive: Uterus is unremarkable. 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 These are significantly enlarged compared to prior exam. Overall they measure 15.5 x 11.0 cm, and it is difficult to distinguish right from left. Other: There is interval development of mild to moderate ascites in the pelvis and around the liver and spleen. 7.9 x 5.0 cm mass is seen along the anterior abdominal  wall in the region of the umbilicus which is enlarged compared to prior exam and consistent with worsening peritoneal implant. Musculoskeletal: Grade 1 anterolisthesis of L4-5 is noted secondary to bilateral L5 spondylolysis. No acute osseous abnormality is noted. IMPRESSION: 4.8 x 2.6 cm solid discrete mass is seen involving pancreatic tail consistent with history of pancreatic adenocarcinoma. Mild to moderate ascites is noted both in the pelvis and around the liver and spleen in the upper  abdomen. 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 This is significantly enlarged compared to prior exam. Significantly enlarged peritoneal implant is seen along the anterior abdominal wall in the periumbilical region consistent with peritoneal carcinomatosis. Heterogeneous appearance of hepatic parenchyma is noted consistent with multiple metastatic lesions. Some degree of hepatic steatosis may be present as well. Increased right pleural effusion is noted with associated atelectasis of the right lower lobe. Electronically Signed   By: Marijo Conception M.D.   On: 07/10/2021 14:56   DG Chest 2 View  Result Date: 06/19/2021 CLINICAL DATA:  Suspected sepsis EXAM: CHEST - 2 VIEW COMPARISON:  04/08/2020 FINDINGS: Low volume examination. Small right pleural effusion associated atelectasis or consolidation. The left lung is normally aerated. Cardiomegaly. Right chest port catheter. IMPRESSION: Low volume examination. Small right pleural effusion and associated atelectasis or consolidation. Electronically Signed   By: Delanna Ahmadi M.D.   On: 07/16/2021 14:37   US Venous Img Lower Bilateral (DVT)  Result Date: 07/14/2021 CLINICAL DATA:  Bilateral lower extremity edema and history of pancreatic cancer. EXAM: BILATERAL LOWER EXTREMITY VENOUS DOPPLER ULTRASOUND TECHNIQUE: Gray-scale sonography with graded compression, as well as color Doppler and duplex ultrasound were performed to evaluate the lower extremity deep venous systems from the level of the common femoral vein and including the common femoral, femoral, profunda femoral, popliteal and calf veins including the posterior tibial, peroneal and gastrocnemius veins when visible. The superficial great saphenous vein was also interrogated. Spectral Doppler was utilized to evaluate flow at rest and with distal augmentation maneuvers in the common femoral, femoral and popliteal  veins. COMPARISON:  None. FINDINGS: RIGHT LOWER EXTREMITY Common Femoral Vein: No evidence of thrombus. Normal compressibility, respiratory phasicity and response to augmentation. Saphenofemoral Junction: No evidence of thrombus. Normal compressibility and flow on color Doppler imaging. Profunda Femoral Vein: No evidence of thrombus. Normal compressibility and flow on color Doppler imaging. Femoral Vein: No evidence of thrombus. Normal compressibility, respiratory phasicity and response to augmentation. Popliteal Vein: No evidence of thrombus. Normal compressibility, respiratory phasicity and response to augmentation. Calf Veins: No evidence of thrombus. Normal compressibility and flow on color Doppler imaging. Superficial Great Saphenous Vein: No evidence of thrombus. Normal compressibility. Venous Reflux:  None. Other Findings: Baker's cyst within the popliteal fossa measures 3.8 by 1.1 x 2.3 cm. LEFT LOWER EXTREMITY Common Femoral Vein: No evidence of thrombus. Normal compressibility, respiratory phasicity and response to augmentation. Saphenofemoral Junction: No evidence of thrombus. Normal compressibility and flow on color Doppler imaging. Profunda Femoral Vein: No evidence of thrombus. Normal compressibility and flow on color Doppler imaging. Femoral Vein: No evidence of thrombus. Normal compressibility, respiratory phasicity and response to augmentation. Popliteal Vein: No evidence of thrombus. Normal compressibility, respiratory phasicity and response to augmentation. Calf Veins: No evidence of thrombus. Normal compressibility and flow on color Doppler imaging. Superficial Great Saphenous Vein: No evidence of thrombus. Normal compressibility. Venous Reflux:  None. Other Findings: Complex fluid collection within the popliteal fossa  measures 3.4 x 1.2 x 1.7 cm containing diffuse internal echoes and areas of calcification. IMPRESSION: 1. No evidence of deep venous thrombosis in either lower extremity. 2.  Right-sided Baker's cyst. 3. Complex fluid collection within the left popliteal fossa containing internal echoes and calcification. Favor complex Baker's cyst with internal debris and calcification. Electronically Signed   By: Kerby Moors M.D.   On: 06/19/2021 15:04   US Paracentesis  Result Date: 07/09/2021 INDICATION: History of pancreatic cancer. Abdominal discomfort with ascites. Request for therapeutic paracentesis. EXAM: ULTRASOUND GUIDED LEFT LOWER QUADRANT PARACENTESIS MEDICATIONS: 1% plain lidocaine, 5 mL COMPLICATIONS: None immediate. PROCEDURE: Informed written consent was obtained from the patient after a discussion of the risks, benefits and alternatives to treatment. A timeout was performed prior to the initiation of the procedure. Initial ultrasound scanning demonstrates a moderate amount of ascites within the left lower abdominal quadrant. Ascites is noted to be partially loculated. The left lower abdomen was prepped and draped in the usual sterile fashion. 1% lidocaine was used for local anesthesia. Following this, a 19 gauge, 10-cm, Yueh catheter was introduced. An ultrasound image was saved for documentation purposes. The paracentesis was performed. The catheter was removed and a dressing was applied. The patient tolerated the procedure well without immediate post procedural complication. FINDINGS: A total of approximately 2.8 L of clear yellow fluid was removed. IMPRESSION: Successful ultrasound-guided paracentesis yielding 2.8 liters of peritoneal fluid. Read by: Ascencion Dike PA-C Electronically Signed   By: Sandi Mariscal M.D.   On: 07/09/2021 16:09    ASSESSMENT AND PLAN: 1.  Metastatic pancreatic adenocarcinoma 2.  Ascites, nausea, vomiting, abdominal pain 3.  Lower extremity edema 4.  Acute respiratory failure with hypoxia 5.  Right pleural effusion 6.  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  Hyponatremia 8.  Type 2 diabetes mellitus 9.  Hypertension  -Discussed CT  scan results with the patient's daughter.  The pancreatic mass is again seen on CT.  There was not direct comparison to the prior CT in terms of size.  She does have an increase in size in the peritoneal implant as well as large complex multicystic mass in the pelvis that may represent ovarian metastatic disease. -Daughter feels that disease has progressed because she has been off chemotherapy for a period of time.  She remains invested in continuing systemic chemotherapy with hopes of getting control of the disease and helping to control symptoms. -The patient will have a paracentesis later today.  She received some Lasix in the emergency department, but unclear how much this is going to help.  Will defer to hospitalist regarding additional Lasix. -The patient has already met with the palliative care team earlier today.  I have also discussed goals of care and CODE STATUS with the patient's daughter.  Based on the religious beliefs, the patient and family want her to remain a full code and want to continue on systemic chemotherapy.  Future Appointments  Date Time Provider [place]  07/28/2021  9:15 AM [facility] LAB [facility] None  07/28/2021  9:30 AM [facility] INJ NURSE [facility] None  07/28/2021  9:45 AM [place], Rudell Cobb, MD [facility] None  07/28/2021 10:00 AM [facility] B4 [facility] None  07/30/2021  1:00 PM [facility] A3 [facility] None      LOS: 1 day   Mikey Bussing, DNP, AGPCNP-BC, AOCNP 07/16/21  Attending Note  I personally saw the patient, reviewed the chart and examined the patient. The plan of care was discussed with the  patient and the admitting team. I agree with the assessment and plan as documented above. Thank you very much for the consultation. Metastatic pancreatic cancer, relapsed refractory disease: Progressed on multiple prior treatments.  Admitted with ascites.  Dr. Marin Olp has been following the patient. his notes indicate that she has poor prognosis. I agree that the patient has  poor prognosis as well.  Unfortunately patient's family is not ready to accept hospice care.  They wanted to discuss with Dr. Marin Olp to make the final decision. I agree that additional chemotherapy would only and toxicity without any major clinical benefit.

## 2021-07-16 NOTE — Assessment & Plan Note (Addendum)
-   diagnosed 02/14/20 - s/p multiple chemo regimens and also saw oncology, Dr. 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 Marin Olp 10/31, who was also in agreement with foregoing further chemo - palliative care consulted given patient's large decline; due to religious beliefs they wish to continue full code at this time. I've directly informed patient's daughter that the patient is imminently approaching end of life

## 2021-07-16 NOTE — Hospital Course (Signed)
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 She has undergone extensive treatment since initial diagnosis in May 2021.  She is followed outpatient by Dr. Marin Olp. Despite treatment, her cancer seems to have progressed and lately she does not appear to be tolerating treatment from a physical ability standpoint.  Her appetite has decreased and she has developed generalized edema with recurrent abdominal ascites and now with enlarging right pleural effusion seen on imaging.  Due to religious beliefs, patient and family continue to want full scope of care without consideration of scaling back treatment in efforts to focus on comfort.

## 2021-07-16 NOTE — Plan of Care (Signed)
  Problem: Education: Goal: Knowledge of General Education information will improve Description: Including pain rating scale, medication(s)/side effects and non-pharmacologic comfort measures Outcome: Progressing   Problem: Safety: Goal: Ability to remain free from injury will improve Outcome: Progressing   Problem: Skin Integrity: Goal: Risk for impaired skin integrity will decrease Outcome: Progressing   Problem: Education: Goal: Knowledge of General Education information will improve Description: Including pain rating scale, medication(s)/side effects and non-pharmacologic comfort measures Outcome: Progressing   Problem: Safety: Goal: Ability to remain free from injury will improve Outcome: Progressing   Problem: Skin Integrity: Goal: Risk for impaired skin integrity will decrease Outcome: Progressing

## 2021-07-16 NOTE — Assessment & Plan Note (Signed)
-   continue SSI and CBG monitoring  

## 2021-07-16 NOTE — Assessment & Plan Note (Addendum)
-   due to large R pleural effusion which is presumed malignant as well - continue O2 - hold off on thoracentesis for now as patient not in any respiratory distress and fluid likely to re-accumulate; I have also informed daughter of this

## 2021-07-17 DIAGNOSIS — J90 Pleural effusion, not elsewhere classified: Secondary | ICD-10-CM | POA: Diagnosis not present

## 2021-07-17 DIAGNOSIS — Z7189 Other specified counseling: Secondary | ICD-10-CM | POA: Diagnosis not present

## 2021-07-17 DIAGNOSIS — R180 Malignant ascites: Secondary | ICD-10-CM | POA: Diagnosis not present

## 2021-07-17 DIAGNOSIS — J9601 Acute respiratory failure with hypoxia: Secondary | ICD-10-CM | POA: Diagnosis not present

## 2021-07-17 LAB — CBC WITH DIFFERENTIAL/PLATELET
Abs Immature Granulocytes: 0.04 10*3/uL (ref 0.00–0.07)
Basophils Absolute: 0 10*3/uL (ref 0.0–0.1)
Basophils Relative: 0 %
Eosinophils Absolute: 0.1 10*3/uL (ref 0.0–0.5)
Eosinophils Relative: 2 %
HCT: 35.2 % — ABNORMAL LOW (ref 36.0–46.0)
Hemoglobin: 10.6 g/dL — ABNORMAL LOW (ref 12.0–15.0)
Immature Granulocytes: 1 %
Lymphocytes Relative: 17 %
Lymphs Abs: 0.8 10*3/uL (ref 0.7–4.0)
MCH: 26.2 pg (ref 26.0–34.0)
MCHC: 30.1 g/dL (ref 30.0–36.0)
MCV: 86.9 fL (ref 80.0–100.0)
Monocytes Absolute: 0.1 10*3/uL (ref 0.1–1.0)
Monocytes Relative: 2 %
Neutro Abs: 3.9 10*3/uL (ref 1.7–7.7)
Neutrophils Relative %: 78 %
Platelets: 163 10*3/uL (ref 150–400)
RBC: 4.05 MIL/uL (ref 3.87–5.11)
RDW: 17.4 % — ABNORMAL HIGH (ref 11.5–15.5)
WBC: 5 10*3/uL (ref 4.0–10.5)
nRBC: 0 % (ref 0.0–0.2)

## 2021-07-17 LAB — COMPREHENSIVE METABOLIC PANEL
ALT: 13 U/L (ref 0–44)
AST: 14 U/L — ABNORMAL LOW (ref 15–41)
Albumin: 2 g/dL — ABNORMAL LOW (ref 3.5–5.0)
Alkaline Phosphatase: 47 U/L (ref 38–126)
Anion gap: 5 (ref 5–15)
BUN: 14 mg/dL (ref 6–20)
CO2: 28 mmol/L (ref 22–32)
Calcium: 7.8 mg/dL — ABNORMAL LOW (ref 8.9–10.3)
Chloride: 97 mmol/L — ABNORMAL LOW (ref 98–111)
Creatinine, Ser: 0.49 mg/dL (ref 0.44–1.00)
GFR, Estimated: >60 mL/min (ref >60)
Glucose, Bld: 225 mg/dL — ABNORMAL HIGH (ref 70–99)
Potassium: 4 mmol/L (ref 3.5–5.1)
Sodium: 130 mmol/L — ABNORMAL LOW (ref 135–145)
Total Bilirubin: 0.9 mg/dL (ref 0.3–1.2)
Total Protein: 5.5 g/dL — ABNORMAL LOW (ref 6.5–8.1)

## 2021-07-17 LAB — MAGNESIUM: Magnesium: 2 mg/dL (ref 1.7–2.4)

## 2021-07-17 LAB — GLUCOSE, CAPILLARY
Glucose-Capillary: 217 mg/dL — ABNORMAL HIGH (ref 70–99)
Glucose-Capillary: 185 mg/dL — ABNORMAL HIGH (ref 70–99)
Glucose-Capillary: 190 mg/dL — ABNORMAL HIGH (ref 70–99)
Glucose-Capillary: 215 mg/dL — ABNORMAL HIGH (ref 70–99)

## 2021-07-17 NOTE — Progress Notes (Addendum)
HOSPITAL MEDICINE OVERNIGHT EVENT NOTE    Notified by nursing that patient has not had any urine output since 4 PM.  Bladder scan performed revealing 443 cc despite patient's  multiple attempts to urinate.  In and out catheterization ordered.  As needed bladder scan orders additionally placed if patient is unable to urinate again for greater than 6 hours.  Will monitor for recurrence.  Vernelle Emerald  MD [facility]   OVERNIGHT EVENT (10/29 3:35PM)  Nursing unable to insert catheter despite multiple attempts.  There is no cath team available here for difficult insertions and in the absence of any discomfort/pain contacting Urology is not warranted at this point.  Nursing will continue to coax patient to urinate spontaneously and get assistance on day shift.   Sherryll Burger Subrina Vecchiarelli

## 2021-07-17 NOTE — Progress Notes (Signed)
Progress Note    Angela Wilson   CXK:[id number]  DOB: 04-Dec-1965  DOA: 07/05/2021     2 Date of Service: 07/17/2021   Clinical Course 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 She has undergone extensive treatment since initial diagnosis in May 2021.  She is followed outpatient by Dr. Marin Olp. Despite treatment, her cancer seems to have progressed and lately she does not appear to be tolerating treatment from a physical ability standpoint.  Her appetite has decreased and she has developed generalized edema with recurrent abdominal ascites and now with enlarging right pleural effusion seen on imaging.  Due to religious beliefs, patient and family continue to want full scope of care without consideration of scaling back treatment in efforts to focus on comfort.  Assessment and Plan * Acute respiratory failure with hypoxia (HCC) - due to large R pleural effusion wish is presumed malignant as well - continue O2 - hold off on thoracentesis for now as patient not in any respiratory distress and fluid likely to re-accumulate; I have also informed daughter of this  Abdominal ascites - due to peritoneal carcinomatosis from pancreatic cancer - last paracentesis performed on 10/21 with 2.8 L removed - now back with recurrent abdominal pain/distension from fluid re-accumulation -Patient underwent repeat paracentesis on 07/16/2021 which removed 3.4 L fluid -I have informed daughter that this will also again continue to reaccumulate  Stage IV adenocarcinoma of pancreas [facility]) - diagnosed 02/14/20 - s/p multiple chemo regimens and also saw oncology, Dr. Fanny Skates, at [facility] for 2nd opinion; liposomal irinotecan was proposed but according to oncology notes this has been denied by insurance previously - furthermore, her  functional and physical status is so poor, I highly doubt she is a good candidate for any further treatment other than focusing on comfort at this time; family wishes to continue discussing this with Dr. Marin Olp - palliative care consulted given patient's large decline; due to religious beliefs they wish to continue full code at this time. I've directly informed patient's daughter that the patient is imminently approaching end of life  Goals of care, counseling/discussion - Patient and family have now had multiple [place] discussions notably on admission with the admitting provider followed by myself today as well as with oncology and palliative care.  Unfortunately due to the patient's religious beliefs, they do not wish for any de-escalation of care at this time and wish to continue remaining full code.  I have directly informed her daughter that the patient's prognosis is poor and she may not survive hospitalization.   -Greatly appreciate palliative care assistance as well with the management of this patient - Daughter continuing to wish to talk with Dr. Marin Olp when able   Pleural effusion on right - See respiratory failure - Continue oxygen  Hyponatremia - likely due to underlying cancer and/or poor intake - hold off in IVF due to high risk of 3rd spacing and further worsening of respiratory status and ascites   Internal jugular (IJ) vein thromboembolism, chronic, right (HCC) - Continue Xarelto  Essential hypertension, benign - BP meds on hold on admission due to low/normal blood pressure - Continue holding especially if further diuretic use and/or with further volume being removed on paracentesis  Type 2 diabetes mellitus ([place]) - continue SSI and CBG monitoring     Subjective:  Underwent paracentesis yesterday.  Daughter present  this morning.  No change in GOC/code status due to religious beliefs.  I again informed daughter patient not thriving well and with her ongoing vomiting and  poor intake that she will continue to decline.   Objective Vitals:   07/17/21 0438 07/17/21 0438 07/17/21 0533 07/17/21 1253  BP: 136/78   (!) 151/73  Pulse: 87   89  Resp: 18   16  Temp:  98.5 F (36.9 C)  98.9 F (37.2 C)  TempSrc:  Oral  Oral  SpO2: 99%   100%  Weight:   101 kg   Height:       101 kg  Vital signs were reviewed and unremarkable.   Exam Physical Exam Constitutional:      Comments: Chronically ill, fatigued, frail woman laying in bed appearing more fatigued and is less responsive than yesterday  HENT:     Head: Normocephalic and atraumatic.     Comments: O2 [place] in place    Mouth/Throat:     Mouth: Mucous membranes are dry.  Eyes:     Extraocular Movements: Extraocular movements intact.  Cardiovascular:     Rate and Rhythm: Normal rate and regular rhythm.  Pulmonary:     Effort: Pulmonary effort is normal. No respiratory distress.     Breath sounds: No wheezing.  Abdominal:     Comments: Less distended, obese, soft, nonspecific TTP.   Musculoskeletal:     Cervical back: Normal range of motion and neck supple.     Comments: 2-3+ B/L LE pitting edema  Skin:    General: Skin is warm and dry.  Neurological:     Comments: Able to move all 4 extremities  Psychiatric:        Behavior: Behavior normal.     Labs / Other Information My review of labs, imaging, notes and other tests shows no new significant findings.    Disposition Plan: Status is: Inpatient  Remains inpatient appropriate because: Ongoing treatment of above    Time spent: Greater than 50% of the 35 minute visit was spent in counseling/coordination of care for the patient as laid out in the A&P.  Dwyane Dee, MD [facility] 07/17/2021, 2:00 PM

## 2021-07-17 NOTE — Plan of Care (Signed)
  Problem: Coping: Goal: Level of anxiety will decrease Outcome: Progressing   Problem: Pain Managment: Goal: General experience of comfort will improve Outcome: Progressing   Problem: Skin Integrity: Goal: Risk for impaired skin integrity will decrease Outcome: Progressing   

## 2021-07-17 NOTE — Progress Notes (Signed)
   07/17/21 0112  Provider Notification  Provider Name/Title Dr. Cyd Silence  Date Provider Notified 07/17/21  Time Provider Notified [phone]  Notification Type Page  Notification Reason Other (Comment) (inability to void)  Provider response See new orders  Date of Provider Response 07/17/21  Time of Provider Response 0119   Tried in and cath 4x with Charge RN assistance, unsuccessful. 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 Patient is drowsy due to meds taken, will not try bedside commode again for now.  Patient not complaining of pain on her bladder. Dr. Cyd Silence was made aware and advised to try in the AM.

## 2021-07-17 NOTE — Progress Notes (Signed)
   07/17/21 4320  Urine Characteristics  Bladder Scan Volume (mL) 297 mL   No urine output all night. Will continue to monitor.

## 2021-07-18 DIAGNOSIS — E861 Hypovolemia: Secondary | ICD-10-CM

## 2021-07-18 DIAGNOSIS — C259 Malignant neoplasm of pancreas, unspecified: Secondary | ICD-10-CM

## 2021-07-18 DIAGNOSIS — J9601 Acute respiratory failure with hypoxia: Secondary | ICD-10-CM | POA: Diagnosis not present

## 2021-07-18 DIAGNOSIS — R180 Malignant ascites: Secondary | ICD-10-CM | POA: Diagnosis not present

## 2021-07-18 DIAGNOSIS — Z7189 Other specified counseling: Secondary | ICD-10-CM | POA: Diagnosis not present

## 2021-07-18 LAB — COMPREHENSIVE METABOLIC PANEL
ALT: 14 U/L (ref 0–44)
AST: 16 U/L (ref 15–41)
Albumin: 2.1 g/dL — ABNORMAL LOW (ref 3.5–5.0)
Alkaline Phosphatase: 53 U/L (ref 38–126)
Anion gap: 9 (ref 5–15)
BUN: 13 mg/dL (ref 6–20)
CO2: 28 mmol/L (ref 22–32)
Calcium: 7.9 mg/dL — ABNORMAL LOW (ref 8.9–10.3)
Chloride: 95 mmol/L — ABNORMAL LOW (ref 98–111)
Creatinine, Ser: 0.35 mg/dL — ABNORMAL LOW (ref 0.44–1.00)
GFR, Estimated: >60 mL/min (ref >60)
Glucose, Bld: 232 mg/dL — ABNORMAL HIGH (ref 70–99)
Potassium: 4.1 mmol/L (ref 3.5–5.1)
Sodium: 132 mmol/L — ABNORMAL LOW (ref 135–145)
Total Bilirubin: 0.8 mg/dL (ref 0.3–1.2)
Total Protein: 5.8 g/dL — ABNORMAL LOW (ref 6.5–8.1)

## 2021-07-18 LAB — GLUCOSE, CAPILLARY
Glucose-Capillary: 229 mg/dL — ABNORMAL HIGH (ref 70–99)
Glucose-Capillary: 231 mg/dL — ABNORMAL HIGH (ref 70–99)
Glucose-Capillary: 200 mg/dL — ABNORMAL HIGH (ref 70–99)
Glucose-Capillary: 207 mg/dL — ABNORMAL HIGH (ref 70–99)

## 2021-07-18 LAB — CBC WITH DIFFERENTIAL/PLATELET
Abs Immature Granulocytes: 0.03 10*3/uL (ref 0.00–0.07)
Basophils Absolute: 0 10*3/uL (ref 0.0–0.1)
Basophils Relative: 0 %
Eosinophils Absolute: 0 10*3/uL (ref 0.0–0.5)
Eosinophils Relative: 1 %
HCT: 35.4 % — ABNORMAL LOW (ref 36.0–46.0)
Hemoglobin: 11 g/dL — ABNORMAL LOW (ref 12.0–15.0)
Immature Granulocytes: 1 %
Lymphocytes Relative: 24 %
Lymphs Abs: 0.9 10*3/uL (ref 0.7–4.0)
MCH: 26.6 pg (ref 26.0–34.0)
MCHC: 31.1 g/dL (ref 30.0–36.0)
MCV: 85.7 fL (ref 80.0–100.0)
Monocytes Absolute: 0.1 10*3/uL (ref 0.1–1.0)
Monocytes Relative: 3 %
Neutro Abs: 2.7 10*3/uL (ref 1.7–7.7)
Neutrophils Relative %: 71 %
Platelets: 155 10*3/uL (ref 150–400)
RBC: 4.13 MIL/uL (ref 3.87–5.11)
RDW: 17.2 % — ABNORMAL HIGH (ref 11.5–15.5)
WBC: 3.8 10*3/uL — ABNORMAL LOW (ref 4.0–10.5)
nRBC: 0 % (ref 0.0–0.2)

## 2021-07-18 LAB — SODIUM, URINE, RANDOM: Sodium, Ur: 16 mmol/L

## 2021-07-18 LAB — URINALYSIS, ROUTINE W REFLEX MICROSCOPIC
Bilirubin Urine: NEGATIVE
Glucose, UA: NEGATIVE mg/dL
Hgb urine dipstick: NEGATIVE
Ketones, ur: 5 mg/dL — AB
Leukocytes,Ua: NEGATIVE
Nitrite: NEGATIVE
Protein, ur: NEGATIVE mg/dL
Specific Gravity, Urine: 1.027 (ref 1.005–1.030)
pH: 5 (ref 5.0–8.0)

## 2021-07-18 LAB — CREATININE, URINE, RANDOM: Creatinine, Urine: 167.84 mg/dL

## 2021-07-18 LAB — MAGNESIUM: Magnesium: 2 mg/dL (ref 1.7–2.4)

## 2021-07-18 MED ORDER — LIP MEDEX EX OINT: TOPICAL_OINTMENT | CUTANEOUS | Status: DC | PRN

## 2021-07-18 MED ORDER — SODIUM CHLORIDE 0.9 % IV SOLN
12.5000 mg | Freq: Four times a day (QID) | INTRAVENOUS | Status: DC | PRN
  Administered 2021-07-18 – 2021-07-24 (×6): 12.5 mg via INTRAVENOUS
  Filled 2021-07-18: qty 12.5
  Filled 2021-07-18: qty 0.5
  Filled 2021-07-18 (×2): qty 12.5
  Filled 2021-07-18: qty 0.5
  Filled 2021-07-18 (×3): qty 12.5

## 2021-07-18 MED ORDER — GLYCOPYRROLATE 0.2 MG/ML IJ SOLN: 0.2000 mg | Freq: Four times a day (QID) | INTRAMUSCULAR | Status: DC | PRN

## 2021-07-18 MED ORDER — HYDROMORPHONE HCL 1 MG/ML IJ SOLN
0.5000 mg | INTRAMUSCULAR | Status: DC | PRN
  Administered 2021-07-18 – 2021-07-29 (×18): 0.5 mg via INTRAVENOUS
  Filled 2021-07-18 (×2): qty 0.5
  Filled 2021-07-18: qty 1
  Filled 2021-07-18 (×2): qty 0.5
  Filled 2021-07-18 (×4): qty 1
  Filled 2021-07-18: qty 0.5
  Filled 2021-07-18 (×3): qty 1
  Filled 2021-07-18 (×3): qty 0.5
  Filled 2021-07-18 (×3): qty 1

## 2021-07-18 NOTE — Assessment & Plan Note (Signed)
-   poor PO intake and ongoing N/V - poor PEG candidate and not likely to affect underlying prognosis  - FeNA is 0%, albumin 2.1; IVF likely to 3rd space and albumin not likely to make a long term difference (e.g. fluids will worsen respiratory status and abdominal ascites) - aggressive medical care is considered futile notably further chemo given such poor functional status

## 2021-07-18 NOTE — Progress Notes (Signed)
Daily Progress Note   Patient Name: Angela Wilson       Date: 07/18/2021 DOB: 03/27/1966  Age: 55 y.o. MRN#: [id number] Attending Physician: Dwyane Dee, MD Primary Care Physician: Just, Laurita Quint, FNP (Inactive) Admit Date: 07/01/2021  Reason for Consultation/Follow-up: Establishing goals of care  Subjective:  Resting in bed, not awake at the moment, family at bedside, playing Holy Quran in the room, patient does not appear to be in distress.   Length of Stay: 3  Current Medications: Scheduled Meds:   Chlorhexidine Gluconate Cloth  6 each Topical Daily   dicyclomine  10 mg Oral QID   dronabinol  2.5 mg Oral BID AC   insulin aspart  0-9 Units Subcutaneous TID WC   mouth rinse  15 mL Mouth Rinse BID   OLANZapine  10 mg Oral QHS   rivaroxaban  20 mg Oral Q supper   sodium chloride flush  3 mL Intravenous Q12H    Continuous Infusions:  promethazine (PHENERGAN) injection (IM or IVPB)      PRN Meds: acetaminophen **OR** acetaminophen, glycopyrrolate, HYDROmorphone (DILAUDID) injection, HYDROmorphone, LORazepam, ondansetron **OR** ondansetron (ZOFRAN) IV, prochlorperazine, promethazine (PHENERGAN) injection (IM or IVPB), senna-docusate, sodium chloride flush  Physical Exam         Appears chronically ill fatigued Regular work of breathing Has O2 [place] Abdomen distended Has edema  Vital Signs: BP (!) 160/83 (BP Location: Right Arm)   Pulse (!) 129   Temp 99.9 F (37.7 C) (Oral)   Resp 20   Ht 5\' 6"  (1.676 m)   Wt 95.4 kg   SpO2 98%   BMI 33.95 kg/m  SpO2: SpO2: 98 % O2 Device: O2 Device: Nasal Cannula O2 Flow Rate: O2 Flow Rate (L/min): 2 L/min  Intake/output summary:  Intake/Output Summary (Last 24 hours) at 07/18/2021 1042 Last data filed at 07/18/2021  0800 Gross per 24 hour  Intake 360 ml  Output 250 ml  Net 110 ml   LBM: Last BM Date: 07/16/21 Baseline Weight: Weight: 100.7 kg Most recent weight: Weight: 95.4 kg PPS 30%      Palliative Assessment/Data:      Patient Active Problem List   Diagnosis Date Noted   Internal jugular (IJ) vein thromboembolism, chronic, right ([place]) 07/16/2021   Goals of care, counseling/discussion 07/16/2021   Acute respiratory failure  with hypoxia ([place]) 07/10/2021   Abdominal ascites 07/14/2021   Pleural effusion on right 07/03/2021   Hyponatremia 07/01/2021   Genetic testing 05/12/2020   Family history of breast cancer    Family history of ovarian cancer    Family history of kidney cancer    Family history of brain cancer    Essential hypertension, benign 04/06/2020   Pancreatic cancer ([place]) 02/14/2020   Cancer associated pain 02/13/2020   Stage IV adenocarcinoma of pancreas ([place]) 02/03/2020   Screening for colon cancer 01/07/2020   Encounter for screening mammogram for malignant neoplasm of breast 01/07/2020   Need for Tdap vaccination 01/07/2020   Pancreatic mass 12/30/2019   Nephrolithiasis 12/20/2012   GERD (gastroesophageal reflux disease) 12/20/2012   Type 2 diabetes mellitus ([place]) 11/09/2012    Palliative Care Assessment & Plan   Patient Profile:    Assessment:  55 yo lady with life limiting illness of pancreatic cancer, ascites, abdominal pain, LE edema.   Recommendations/Plan:  Continue current mode of care. Will add IV Dilaudid PRN pain/shortness of breath as well as IV Robinul PRN secretions.  Anticipated hospital death Prognosis appears markedly limited to hours to some very limited number of days. Discussed with Dr Sabino Gasser.   Goals of Care and Additional Recommendations: Limitations on Scope of Treatment: Full Scope Treatment  Code Status:    Code Status Orders  (From admission, onward)           Start     Ordered   07/13/2021 2022  Full code  Continuous         07/14/2021 2024           Code Status History     Date Active Date Inactive Code Status Order ID Comments User Context   02/14/2020 0213 02/18/2020 2347 Full Code [id number]  Clance Boll, MD Inpatient       Prognosis:  Hours - Days  Discharge Planning: Anticipated Hospital Death  Care plan was discussed with IDT.    Thank you for allowing the Palliative Medicine Team to assist in the care of this patient.   Time In: 9 Time Out: 9.25 Total Time 25 Prolonged Time Billed  no       Greater than 50%  of this time was spent counseling and coordinating care related to the above assessment and plan.  Loistine Chance, MD  Please contact Palliative Medicine Team phone at [phone] for questions and concerns.

## 2021-07-18 NOTE — Progress Notes (Signed)
Assumed care of pt from Osvaldo Human., RN. Agree with his assessment of the pt and Pt resting in bed with daughter at the bedside, Will continue plan of care.

## 2021-07-18 NOTE — Progress Notes (Signed)
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 She has undergone extensive treatment since initial diagnosis in May 2021.  She is followed outpatient by Dr. Marin Olp. Despite treatment, her cancer seems to have progressed and lately she does not appear to be tolerating treatment from a physical ability standpoint.  Her appetite has decreased and she has developed generalized edema with recurrent abdominal ascites and now with enlarging right pleural effusion seen on imaging.  Due to religious beliefs, patient and family continue to want full scope of care without consideration of scaling back treatment in efforts to focus on comfort.  Assessment and Plan * Acute respiratory failure with hypoxia (HCC) - due to large R pleural effusion which is presumed malignant as well - continue O2 - hold off on thoracentesis for now as patient not in any respiratory distress and fluid likely to re-accumulate; I have also informed daughter of this  Abdominal ascites - due to peritoneal carcinomatosis from pancreatic cancer - last paracentesis performed on 10/21 with 2.8 L removed - now back with recurrent abdominal pain/distension from fluid re-accumulation -Patient underwent repeat paracentesis on 07/16/2021 which removed 3.4 L fluid -I have informed daughter that this will also again continue to reaccumulate  Stage IV adenocarcinoma of pancreas [facility]) - diagnosed 02/14/20 - s/p multiple chemo regimens and also saw oncology, Dr. Fanny Skates, at [facility] for 2nd opinion; liposomal irinotecan was proposed but according to oncology notes this has been denied by insurance previously - now has peritoneal  carcinomatosis, right pleural effusion (?malignant as well), RIJ DVT - furthermore, her functional and physical status is so poor, I highly doubt she is a good candidate for any further treatment other than focusing on comfort at this time; family wishes to continue discussing this with Dr. Marin Olp - palliative care consulted given patient's large decline; due to religious beliefs they wish to continue full code at this time. I've directly informed patient's daughter that the patient is imminently approaching end of life  Goals of care, counseling/discussion - Patient and family have now had multiple [place] discussions notably on admission with the admitting provider followed by myself today as well as with oncology and palliative care.  Unfortunately due to the patient's religious beliefs, they do not wish for any de-escalation of care at this time and wish to continue remaining full code.  I have directly informed her daughter that the patient's prognosis is poor and she may not survive hospitalization.   -Greatly appreciate palliative care assistance as well with the management of this patient - Daughter continuing to wish to talk with Dr. Marin Olp when able   Pleural effusion on right - See respiratory failure - Continue oxygen  Intravascular volume depletion - poor PO intake and ongoing N/V - poor PEG candidate and not likely to affect underlying prognosis  - FeNA is 0%, albumin 2.1; IVF likely to 3rd space and albumin not likely to make a long term difference (e.g. fluids will worsen respiratory status and abdominal ascites) - aggressive medical care is considered futile notably further chemo given such poor functional status   Hyponatremia - likely due to underlying cancer and/or poor intake - hold off in IVF due to  high risk of 3rd spacing and further worsening of respiratory status and ascites   Internal jugular (IJ) vein thromboembolism, chronic, right (HCC) - Continue  Xarelto  Essential hypertension, benign - BP meds on hold on admission due to low/normal blood pressure - Continue holding especially if further diuretic use and/or with further volume being removed on paracentesis  Type 2 diabetes mellitus ([place]) - continue SSI and CBG monitoring      Subjective:  No events overnight.  Still has ongoing vomiting per her daughter whom is bedside this morning.  Patient not very responsive while I am in the room but daughter states she has been interactive with her. Main conversation with daughter this morning was centered around trying to control the nausea. When I again brought up talking about code status, her daughter became frustrated as she is still expecting to give her mom more chemo and all the current symptoms are "from the recent chemo".   Objective Vitals:   07/17/21 2125 07/18/21 0407 07/18/21 0412 07/18/21 1222  BP: 138/65 (!) 160/83  131/67  Pulse: (!) 122 (!) 129  (!) 129  Resp: 18 20  20   Temp: (!) 101.4 F (38.6 C) 99.9 F (37.7 C)  99.1 F (37.3 C)  TempSrc: Oral Oral  Oral  SpO2: 100% 98%  95%  Weight:   95.4 kg   Height:       95.4 kg  Vital signs were reviewed and unremarkable.   Exam Physical Exam Constitutional:      Comments: Chronically ill, fatigued, frail woman laying in bed appearing more fatigued and is less responsive than yesterday  HENT:     Head: Normocephalic and atraumatic.     Comments: O2 [place] in place    Mouth/Throat:     Mouth: Mucous membranes are dry.  Eyes:     Extraocular Movements: Extraocular movements intact.  Cardiovascular:     Rate and Rhythm: Normal rate and regular rhythm.  Pulmonary:     Effort: Pulmonary effort is normal. No respiratory distress.     Breath sounds: No wheezing.  Abdominal:     Comments: Less distended, obese, soft, nonspecific TTP.   Musculoskeletal:     Cervical back: Normal range of motion and neck supple.     Comments: 2-3+ B/L LE pitting edema  Skin:     General: Skin is warm and dry.  Neurological:     Comments: Able to move all 4 extremities  Psychiatric:        Behavior: Behavior normal.     Labs / Other Information My review of labs, imaging, notes and other tests shows no new significant findings.    Disposition Plan: Status is: Inpatient  Remains inpatient appropriate because: ongoing treatment of above     Time spent: Greater than 50% of the 35 minute visit was spent in counseling/coordination of care for the patient as laid out in the A&P.  Dwyane Dee, MD [facility] 07/18/2021, 1:56 PM

## 2021-07-18 NOTE — Progress Notes (Addendum)
Pt's Sinus Tachy HR 130-145 asymptomatic increases with activity  non-sustained. MD Girguis notified. Will re-evaluate and continue assessing.     Daughter at bedside reported her mom is having neck pain similar to her past history of blood clots in the neck. MD Dwyane Dee notified. See new orders.

## 2021-07-18 NOTE — Plan of Care (Signed)
  Problem: Education: Goal: Knowledge of General Education information will improve Description: Including pain rating scale, medication(s)/side effects and non-pharmacologic comfort measures Outcome: Not Progressing   Problem: Activity: Goal: Risk for activity intolerance will decrease Outcome: Not Progressing   Problem: Coping: Goal: Level of anxiety will decrease Outcome: Not Progressing   Problem: Education: Goal: Knowledge of General Education information will improve Description: Including pain rating scale, medication(s)/side effects and non-pharmacologic comfort measures Outcome: Not Progressing   Problem: Activity: Goal: Risk for activity intolerance will decrease Outcome: Not Progressing   Problem: Coping: Goal: Level of anxiety will decrease Outcome: Not Progressing

## 2021-07-19 DIAGNOSIS — Z7189 Other specified counseling: Secondary | ICD-10-CM | POA: Diagnosis not present

## 2021-07-19 DIAGNOSIS — R180 Malignant ascites: Secondary | ICD-10-CM | POA: Diagnosis not present

## 2021-07-19 DIAGNOSIS — C259 Malignant neoplasm of pancreas, unspecified: Secondary | ICD-10-CM | POA: Diagnosis not present

## 2021-07-19 DIAGNOSIS — J9601 Acute respiratory failure with hypoxia: Secondary | ICD-10-CM | POA: Diagnosis not present

## 2021-07-19 LAB — CBC WITH DIFFERENTIAL/PLATELET
Abs Immature Granulocytes: 0.04 10*3/uL (ref 0.00–0.07)
Basophils Absolute: 0 10*3/uL (ref 0.0–0.1)
Basophils Relative: 0 %
Eosinophils Absolute: 0 10*3/uL (ref 0.0–0.5)
Eosinophils Relative: 1 %
HCT: 33.1 % — ABNORMAL LOW (ref 36.0–46.0)
Hemoglobin: 10.2 g/dL — ABNORMAL LOW (ref 12.0–15.0)
Immature Granulocytes: 1 %
Lymphocytes Relative: 23 %
Lymphs Abs: 0.9 10*3/uL (ref 0.7–4.0)
MCH: 26.5 pg (ref 26.0–34.0)
MCHC: 30.8 g/dL (ref 30.0–36.0)
MCV: 86 fL (ref 80.0–100.0)
Monocytes Absolute: 0.2 10*3/uL (ref 0.1–1.0)
Monocytes Relative: 4 %
Neutro Abs: 2.7 10*3/uL (ref 1.7–7.7)
Neutrophils Relative %: 71 %
Platelets: 145 10*3/uL — ABNORMAL LOW (ref 150–400)
RBC: 3.85 MIL/uL — ABNORMAL LOW (ref 3.87–5.11)
RDW: 17.2 % — ABNORMAL HIGH (ref 11.5–15.5)
WBC: 3.8 10*3/uL — ABNORMAL LOW (ref 4.0–10.5)
nRBC: 0 % (ref 0.0–0.2)

## 2021-07-19 LAB — GLUCOSE, CAPILLARY
Glucose-Capillary: 222 mg/dL — ABNORMAL HIGH (ref 70–99)
Glucose-Capillary: 198 mg/dL — ABNORMAL HIGH (ref 70–99)
Glucose-Capillary: 218 mg/dL — ABNORMAL HIGH (ref 70–99)
Glucose-Capillary: 221 mg/dL — ABNORMAL HIGH (ref 70–99)

## 2021-07-19 LAB — COMPREHENSIVE METABOLIC PANEL
ALT: 16 U/L (ref 0–44)
AST: 18 U/L (ref 15–41)
Albumin: 1.8 g/dL — ABNORMAL LOW (ref 3.5–5.0)
Alkaline Phosphatase: 43 U/L (ref 38–126)
Anion gap: 7 (ref 5–15)
BUN: 14 mg/dL (ref 6–20)
CO2: 27 mmol/L (ref 22–32)
Calcium: 7.5 mg/dL — ABNORMAL LOW (ref 8.9–10.3)
Chloride: 97 mmol/L — ABNORMAL LOW (ref 98–111)
Creatinine, Ser: 0.46 mg/dL (ref 0.44–1.00)
GFR, Estimated: >60 mL/min (ref >60)
Glucose, Bld: 239 mg/dL — ABNORMAL HIGH (ref 70–99)
Potassium: 3.8 mmol/L (ref 3.5–5.1)
Sodium: 131 mmol/L — ABNORMAL LOW (ref 135–145)
Total Bilirubin: 0.7 mg/dL (ref 0.3–1.2)
Total Protein: 5.3 g/dL — ABNORMAL LOW (ref 6.5–8.1)

## 2021-07-19 LAB — MAGNESIUM: Magnesium: 1.8 mg/dL (ref 1.7–2.4)

## 2021-07-19 LAB — UREA NITROGEN, URINE: Urea Nitrogen, Ur: 1274 mg/dL

## 2021-07-19 MED ORDER — SODIUM CHLORIDE 0.9 % IV SOLN
150.0000 mg | Freq: Once | INTRAVENOUS | Status: AC
Start: 2021-07-19 — End: 2021-07-19
  Administered 2021-07-19: 150 mg via INTRAVENOUS
  Filled 2021-07-19: qty 5

## 2021-07-19 MED ORDER — MIRTAZAPINE 15 MG PO TABS
30.0000 mg | ORAL_TABLET | Freq: Every day | ORAL | Status: DC
  Administered 2021-07-19 – 2021-07-22 (×3): 30 mg via ORAL
  Administered 2021-07-23: 15 mg via ORAL
  Filled 2021-07-19 (×5): qty 2

## 2021-07-19 NOTE — Progress Notes (Signed)
Progress Note    Sueanne Maniaci   WGN:[id number]  DOB: 25-Jul-1966  DOA: 06/25/2021     4 Date of Service: 07/19/2021   Clinical Course 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 She has undergone extensive treatment since initial diagnosis in May 2021.  She is followed outpatient by Dr. Marin Olp. Despite treatment, her cancer seems to have progressed and lately she does not appear to be tolerating treatment from a physical ability standpoint.  Her appetite has decreased and she has developed generalized edema with recurrent abdominal ascites and now with enlarging right pleural effusion seen on imaging.  Due to religious beliefs, patient and family continue to want full scope of care without consideration of scaling back treatment in efforts to focus on comfort.  Assessment and Plan * Acute respiratory failure with hypoxia (HCC) - due to large R pleural effusion which is presumed malignant as well - continue O2 - hold off on thoracentesis for now as patient not in any respiratory distress and fluid likely to re-accumulate; I have also informed daughter of this  Abdominal ascites - due to peritoneal carcinomatosis from pancreatic cancer - last paracentesis performed on 10/21 with 2.8 L removed - now back with recurrent abdominal pain/distension from fluid re-accumulation -Patient underwent repeat paracentesis on 07/16/2021 which removed 3.4 L fluid -I have informed daughter that this will also again continue to reaccumulate  Stage IV adenocarcinoma of pancreas [facility]) - diagnosed 02/14/20 - s/p multiple chemo regimens and also saw oncology, Dr. Fanny Skates, at [facility] for 2nd opinion; liposomal irinotecan was proposed but according to oncology notes this has been denied by insurance previously - now has peritoneal  carcinomatosis, right pleural effusion (?malignant as well), RIJ DVT - furthermore, her functional and physical status is so poor, I highly doubt she is a good candidate for any further treatment other than focusing on comfort at this time; she was seen by Dr. Marin Olp 10/31, who was also in agreement with foregoing further chemo - palliative care consulted given patient's large decline; due to religious beliefs they wish to continue full code at this time. I've directly informed patient's daughter that the patient is imminently approaching end of life  Goals of care, counseling/discussion - Patient and family have now had multiple [place] discussions notably on admission with the admitting provider followed by myself today as well as with oncology and palliative care.  Unfortunately due to the patient's religious beliefs, they do not wish for any de-escalation of care at this time and wish to continue remaining full code.  I have directly informed her daughter that the patient's prognosis is poor and she may not survive hospitalization.   -Greatly appreciate palliative care assistance as well with the management of this patient - Daughter has spoke with Dr. Marin Olp on 07/19/2021, no further chemo was offered and prognosis was reiterated to be poor.  Main focus at this time is still centered around keeping patient comfortable as she is still actively approaching end-of-life.  Pleural effusion on right - See respiratory failure - Continue oxygen  Intravascular volume depletion - poor PO intake and ongoing N/V - poor PEG candidate and not likely to affect underlying prognosis  - FeNA is 0%, albumin 2.1; IVF likely to 3rd space and albumin not likely to make a long term difference (e.g. fluids will worsen respiratory status and abdominal  ascites) - aggressive medical care is considered futile notably further chemo given such poor functional status   Hyponatremia - likely due to underlying cancer and/or  poor intake - hold off in IVF due to high risk of 3rd spacing and further worsening of respiratory status and ascites   Internal jugular (IJ) vein thromboembolism, chronic, right (HCC) - Continue Xarelto  Essential hypertension, benign - BP meds on hold on admission due to low/normal blood pressure - Continue holding especially if further diuretic use and/or with further volume being removed on paracentesis  Type 2 diabetes mellitus ([place]) - continue SSI and CBG monitoring      Subjective:  No events overnight.  Daughter was able to speak with Dr. Marin Olp this morning.  She states that the extra nausea medicine started yesterday also helped some.  Patient otherwise resting comfortably in bed but did have some abdominal discomfort with palpation on exam.  Otherwise daughter wished to continue current plan in place.  Objective Vitals:   07/18/21 2000 07/19/21 0447 07/19/21 0530 07/19/21 1121  BP: (!) 151/81 (!) 145/82  (!) 144/78  Pulse: (!) 131 (!) 130  (!) 129  Resp: 14   (!) 24  Temp: 100.2 F (37.9 C) 99.8 F (37.7 C)  99.2 F (37.3 C)  TempSrc: Oral Oral  Oral  SpO2: 96% 95%  96%  Weight:   94.3 kg   Height:       94.3 kg  Vital signs were reviewed and unremarkable.   Exam Physical Exam Constitutional:      Comments: Chronically ill, fatigued, frail woman laying in bed appearing fatigued/lethargic but in no distress  HENT:     Head: Normocephalic and atraumatic.     Comments: O2 [place] in place    Mouth/Throat:     Mouth: Mucous membranes are dry.  Eyes:     Extraocular Movements: Extraocular movements intact.  Cardiovascular:     Rate and Rhythm: Normal rate and regular rhythm.  Pulmonary:     Effort: Pulmonary effort is normal. No respiratory distress.     Breath sounds: No wheezing.  Abdominal:     Comments: Less distended, obese, soft, nonspecific TTP.   Musculoskeletal:     Cervical back: Normal range of motion and neck supple.     Comments: 2-3+ B/L LE  pitting edema  Skin:    General: Skin is warm and dry.  Neurological:     Comments: Able to move all 4 extremities  Psychiatric:        Behavior: Behavior normal.     Labs / Other Information My review of labs, imaging, notes and other tests shows no new significant findings.    Disposition Plan: Status is: Inpatient  Remains inpatient appropriate because: ongoing treatment of above     Time spent: Greater than 50% of the 35 minute visit was spent in counseling/coordination of care for the patient as laid out in the A&P.  Dwyane Dee, MD [facility] 07/19/2021, 12:34 PM

## 2021-07-19 NOTE — Progress Notes (Signed)
I do appreciate everybody's help with Angela Wilson while I was gone.  I am not surprised at all that she is in the hospital.  I just hate that she is in the hospital.  Her decline is not a surprise to me.  Her daughter still is not willing to focus on her comfort.  We did give her 1 dose of chemotherapy last week.  I am just not convinced that she is going to be able to take any further chemotherapy given her status.  She has had fluid taken off her abdomen.  Sure this is all malignant fluid.  She has carcinomatosis.  The leg swelling seems to be doing better.  This is given her daughter some encouragement.  She is not eating.  I told her daughter that putting a feeding tube in her is not going to help the situation.  If she is going to eat, she is going to eat.  If not, there is not much that we can do otherwise.  She is on Marinol.  Maybe will try some Remeron to see if this may help a little bit.  She is having some neck pain.  I am not sure how this can be evaluated.  It will be hard to get her downstairs for a x-ray or CT scan.  She does have the abdominal distention.  Again I am sure this is from malignancy and from fluid.  She is having no problems with cough or shortness of breath.  Her labs today show white count 3.8.  Hemoglobin 10.2.  Platelet count 145,000.  Her blood sugar is 239.  BUN is 14 creatinine 0.46.  Calcium 7.5 with an albumin of 1.8.  I know that a few weeks ago, I did check a prealbumin on her and this was incredibly low at I think 7.  I told her son-in-law that given that fact, I did not think that she would make it through November.  I am still going with this.  Again I know her daughter is trying her best.  I just am not sure if we will be able to give her mom any more treatment.  I think she would have to get a lot stronger if we were able to give her treatment.  Hospice is not an option right now for the family.  Trying to get a CODE STATUS on her is also very  difficult.  I will try to keep working on this.  I do appreciate Palliative Care coming by to help out.  Again, I am not surprised by the fact that Angela Wilson is in the hospital.  I does think that her body is not able to withstand this cancer.  I know that the staff on [address] are doing a fantastic job with her.  I know that they are trying their best to make sure she is comfortable.  I do appreciate their compassion and there professional care.  Angela Haw, MD

## 2021-07-19 NOTE — TOC Initial Note (Signed)
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 She has undergone extensive treatment since initial diagnosis in May 2021.  She is followed outpatient by Dr. Marin Olp. Despite treatment, her cancer seems to have progressed and lately she does not appear to be tolerating treatment from a physical ability standpoint.  Her appetite has decreased and she has developed generalized edema with recurrent abdominal ascites and now with enlarging right pleural effusion seen on imaging.  Due to religious beliefs, patient and family continue to want full scope of care without consideration of scaling back treatment in efforts to focus on comfort.   Assessment and Plan * Acute respiratory failure with hypoxia (HCC) - due to large R pleural effusion which is presumed malignant as well - continue O2 - hold off on thoracentesis for now as patient not in any respiratory distress and fluid likely to re-accumulate; I have also informed daughter of this   Abdominal ascites - due to peritoneal carcinomatosis from pancreatic cancer - last paracentesis performed on 10/21 with 2.8 L removed - now back with recurrent abdominal pain/distension from fluid re-accumulation -Patient underwent repeat paracentesis on 07/16/2021 which removed 3.4 L fluid -I have informed daughter that this will also again continue to reaccumulate   Stage IV adenocarcinoma of pancreas [facility]) - diagnosed 02/14/20 - s/p multiple chemo regimens and also saw oncology, Dr. Fanny Skates, at [facility] for 2nd opinion; liposomal  irinotecan was proposed but according to oncology notes this has been denied by insurance previously - now has peritoneal carcinomatosis, right pleural effusion (?malignant as well), RIJ DVT - furthermore, her functional and physical status is so poor, I highly doubt she is a good candidate for any further treatment other than focusing on comfort at this time; family wishes to continue discussing this with Dr. Marin Olp - palliative care consulted given patient's large decline; due to religious beliefs they wish to continue full code at this time. I've directly informed patient's daughter that the patient is imminently approaching end of life   Goals of care, counseling/discussion - Patient and family have now had multiple [place] discussions notably on admission with the admitting provider followed by myself today as well as with oncology and palliative care.  Unfortunately due to the patient's religious beliefs, they do not wish for any de-escalation of care at this time and wish to continue remaining full code.  I have directly informed her daughter that the patient's prognosis is poor and she may not survive hospitalization.   -Greatly appreciate palliative care assistance as well with the management of this patient - Daughter continuing to wish to talk with Dr. Marin Olp when able    Pleural effusion on right - See respiratory failure - Continue oxygen   Intravascular volume depletion - poor PO intake and ongoing N/V - poor PEG candidate and not likely to affect underlying prognosis  - FeNA is 0%, albumin 2.1; IVF likely to 3rd space and albumin not likely to make a long term difference (e.g. fluids will worsen respiratory status and abdominal ascites) -  aggressive medical care is considered futile notably further chemo given such poor functional status    Hyponatremia - likely due to underlying cancer and/or poor intake - hold off in IVF due to high risk of 3rd spacing and further worsening of  respiratory status and ascites    Internal jugular (IJ) vein thromboembolism, chronic, right (HCC) - Continue Xarelto   Essential hypertension, benign - BP meds on hold on admission due to low/normal blood pressure - Continue holding especially if further diuretic use and/or with further volume being removed on paracentesis   Type 2 diabetes mellitus ([place]) - continue SSI and CBG monitoring    TOC PLAN OF CARE: following for toc needs and progression. O2 at 2lmin [place], bld culturex2x4days neg,wbc-3.8,platelets-145, bld glucose -239 on insulin  Expected Discharge Plan: Home/Self Care Barriers to Discharge: Continued Medical Work up   Patient Goals and CMS Choice Patient states their goals for this hospitalization and ongoing recovery are:: to go home CMS Medicare.gov Compare Post Acute Care list provided to:: Patient    Expected Discharge Plan and Services Expected Discharge Plan: Home/Self Care   Discharge Planning Services: CM Consult   Living arrangements for the past 2 months: Single Family Home                                      Prior Living Arrangements/Services Living arrangements for the past 2 months: Single Family Home Lives with:: Self Patient language and need for interpreter reviewed:: Yes Do you feel safe going back to the place where you live?: Yes            Criminal Activity/Legal Involvement Pertinent to Current Situation/Hospitalization: No - Comment as needed  Activities of Daily Living Home Assistive Devices/Equipment: None ADL Screening (condition at time of admission) Patient's cognitive ability adequate to safely complete daily activities?: Yes Is the patient deaf or have difficulty hearing?: No Does the patient have difficulty seeing, even when wearing glasses/contacts?: No Does the patient have difficulty concentrating, remembering, or making decisions?: No Patient able to express need for assistance with ADLs?: Yes Does the patient  have difficulty dressing or bathing?: Yes Independently performs ADLs?: No Communication: Independent Dressing (OT): Needs assistance Is this a change from baseline?: Change from baseline, expected to last >3 days Grooming: Independent Feeding: Independent Bathing: Needs assistance Is this a change from baseline?: Change from baseline, expected to last >3 days Toileting: Needs assistance Is this a change from baseline?: Change from baseline, expected to last >3days In/Out Bed: Needs assistance Is this a change from baseline?: Change from baseline, expected to last >3 days Walks in Home: Needs assistance Is this a change from baseline?: Change from baseline, expected to last >3 days Does the patient have difficulty walking or climbing stairs?: Yes Weakness of Legs: Both Weakness of Arms/Hands: Both  Permission Sought/Granted                  Emotional Assessment Appearance:: Appears stated age     Orientation: : Oriented to Self, Oriented to Place, Oriented to  Time, Oriented to Situation Alcohol / Substance Use: Not Applicable Psych Involvement: No (comment)  Admission diagnosis:  Leg swelling [M79.89] Malignant ascites [R18.0] Acute respiratory failure with hypoxia ([place]) [J96.01] Malignant neoplasm of pancreas, unspecified location of malignancy [facility]) [C25.9] Patient Active Problem List   Diagnosis Date Noted   Intravascular volume depletion 07/18/2021   Internal jugular (  IJ) vein thromboembolism, chronic, right ([place]) 07/16/2021   Goals of care, counseling/discussion 07/16/2021   Acute respiratory failure with hypoxia ([place]) 07/06/2021   Abdominal ascites 06/24/2021   Pleural effusion on right 07/19/2021   Hyponatremia 06/28/2021   Genetic testing 05/12/2020   Family history of breast cancer    Family history of ovarian cancer    Family history of kidney cancer    Family history of brain cancer    Essential hypertension, benign 04/06/2020   Pancreatic cancer  ([place]) 02/14/2020   Cancer associated pain 02/13/2020   Stage IV adenocarcinoma of pancreas ([place]) 02/03/2020   Screening for colon cancer 01/07/2020   Encounter for screening mammogram for malignant neoplasm of breast 01/07/2020   Need for Tdap vaccination 01/07/2020   Pancreatic mass 12/30/2019   Nephrolithiasis 12/20/2012   GERD (gastroesophageal reflux disease) 12/20/2012   Type 2 diabetes mellitus ([place]) 11/09/2012   PCP:  Just, Laurita Quint, FNP (Inactive) Pharmacy:   [facility], [address] [place] [address] Phone: [phone] Fax: [facility] [address] [place] [address] Phone: [phone] Fax: [phone]  [facility], [place] - [address] [place] [address] Phone: [phone] Fax: [phone]     Social Determinants of Health ([place]) Interventions    Readmission Risk Interventions No flowsheet data found.

## 2021-07-19 NOTE — Plan of Care (Signed)
  Problem: Education: Goal: Knowledge of General Education information will improve Description: Including pain rating scale, medication(s)/side effects and non-pharmacologic comfort measures Outcome: Progressing   Problem: Clinical Measurements: Goal: Diagnostic test results will improve Outcome: Progressing   

## 2021-07-19 NOTE — Progress Notes (Signed)
Daily Progress Note   Patient Name: Angela Wilson       Date: 07/19/2021 DOB: [birth year]  Age: 55 y.o. MRN#: [id number] Attending Physician: Dwyane Dee, MD Primary Care Physician: Just, Laurita Quint, FNP (Inactive) Admit Date: 07/03/2021  Reason for Consultation/Follow-up: Establishing goals of care  Subjective:  Resting in bed, awakens some, discussed with daughter at bedside, they appreciated Dr Marin Olp checking in on them earlier this morning. Not in acute distress, spoke with the daughter about the patient's current medications from a palliative standpoint, we discussed about judicious use of opioids.  All of her questions answered to the best of my ability.  Additionally, patient's daughter also had questions about documentation of the severity of the patient's illness, patient's 2 other children are trying to come in to [place] to see her.  Length of Stay: 4  Current Medications: Scheduled Meds:   Chlorhexidine Gluconate Cloth  6 each Topical Daily   dronabinol  2.5 mg Oral BID AC   insulin aspart  0-9 Units Subcutaneous TID WC   mouth rinse  15 mL Mouth Rinse BID   mirtazapine  30 mg Oral QHS   OLANZapine  10 mg Oral QHS   rivaroxaban  20 mg Oral Q supper   sodium chloride flush  3 mL Intravenous Q12H    Continuous Infusions:  promethazine (PHENERGAN) injection (IM or IVPB) 12.5 mg (07/18/21 1211)    PRN Meds: acetaminophen **OR** acetaminophen, glycopyrrolate, HYDROmorphone (DILAUDID) injection, HYDROmorphone, lip balm, LORazepam, ondansetron **OR** ondansetron (ZOFRAN) IV, prochlorperazine, promethazine (PHENERGAN) injection (IM or IVPB), senna-docusate, sodium chloride flush  Physical Exam         Appears chronically ill fatigued Regular work of breathing Has O2  [place] Abdomen distended Has edema  Vital Signs: BP (!) 144/78 (BP Location: Right Arm)   Pulse (!) 129   Temp 99.2 F (37.3 C) (Oral)   Resp (!) 24   Ht 5\' 6"  (1.676 m)   Wt 94.3 kg   SpO2 96%   BMI 33.55 kg/m  SpO2: SpO2: 96 % O2 Device: O2 Device: Nasal Cannula O2 Flow Rate: O2 Flow Rate (L/min): 2 L/min  Intake/output summary:  Intake/Output Summary (Last 24 hours) at 07/19/2021 1224 Last data filed at 07/18/2021 1800 Gross per 24 hour  Intake 140 ml  Output --  Net 140  ml    LBM: Last BM Date: 07/16/21 Baseline Weight: Weight: 100.7 kg Most recent weight: Weight: 94.3 kg PPS 30%      Palliative Assessment/Data:      Patient Active Problem List   Diagnosis Date Noted   Intravascular volume depletion 07/18/2021   Internal jugular (IJ) vein thromboembolism, chronic, right ([place]) 07/16/2021   Goals of care, counseling/discussion 07/16/2021   Acute respiratory failure with hypoxia ([place]) 06/26/2021   Abdominal ascites 07/09/2021   Pleural effusion on right 06/23/2021   Hyponatremia 07/04/2021   Genetic testing 05/12/2020   Family history of breast cancer    Family history of ovarian cancer    Family history of kidney cancer    Family history of brain cancer    Essential hypertension, benign 04/06/2020   Pancreatic cancer ([place]) 02/14/2020   Cancer associated pain 02/13/2020   Stage IV adenocarcinoma of pancreas ([place]) 02/03/2020   Screening for colon cancer 01/07/2020   Encounter for screening mammogram for malignant neoplasm of breast 01/07/2020   Need for Tdap vaccination 01/07/2020   Pancreatic mass 12/30/2019   Nephrolithiasis 12/20/2012   GERD (gastroesophageal reflux disease) 12/20/2012   Type 2 diabetes mellitus ([place]) 11/09/2012    Palliative Care Assessment & Plan   Patient Profile:    Assessment:  55 yo lady with life limiting illness of pancreatic cancer, ascites, abdominal pain, LE edema.   Recommendations/Plan:  Continue current mode of  care. Now on IV Dilaudid PRN pain/shortness of breath as well as IV Robinul PRN secretions.  Anticipated hospital death Prognosis appears markedly limited to hours to some very limited number of days. Discussed with Dr Sabino Gasser.  Discussed with patient's daughter at bedside.  Full code/full scope for now even though patient and family realize the serious nature of her illness.  Letter provided regarding the severity of the patient's illness so that her son and daughter can apply for emergency visa to come over to [place] to see the patient.  Offered active listening and supportive care.  Goals of Care and Additional Recommendations: Limitations on Scope of Treatment: Full Scope Treatment  Code Status:    Code Status Orders  (From admission, onward)           Start     Ordered   06/26/2021 2022  Full code  Continuous        07/17/2021 2024           Code Status History     Date Active Date Inactive Code Status Order ID Comments User Context   02/14/2020 0213 02/18/2020 2347 Full Code [id number]  Clance Boll, MD Inpatient       Prognosis:  Hours - Days  Discharge Planning: Anticipated Hospital Death  Care plan was discussed with IDT.    Thank you for allowing the Palliative Medicine Team to assist in the care of this patient.   Time In: 10 Time Out: 10.25 Total Time 25 Prolonged Time Billed  no       Greater than 50%  of this time was spent counseling and coordinating care related to the above assessment and plan.  Loistine Chance, MD  Please contact Palliative Medicine Team phone at [phone] for questions and concerns.

## 2021-07-20 ENCOUNTER — Encounter: Payer: Self-pay | Admitting: *Deleted

## 2021-07-20 ENCOUNTER — Inpatient Hospital Stay (HOSPITAL_COMMUNITY): Payer: 59

## 2021-07-20 DIAGNOSIS — M79601 Pain in right arm: Secondary | ICD-10-CM

## 2021-07-20 DIAGNOSIS — J9601 Acute respiratory failure with hypoxia: Secondary | ICD-10-CM | POA: Diagnosis not present

## 2021-07-20 DIAGNOSIS — Z515 Encounter for palliative care: Secondary | ICD-10-CM | POA: Diagnosis not present

## 2021-07-20 DIAGNOSIS — R180 Malignant ascites: Secondary | ICD-10-CM | POA: Diagnosis not present

## 2021-07-20 DIAGNOSIS — C259 Malignant neoplasm of pancreas, unspecified: Secondary | ICD-10-CM | POA: Diagnosis not present

## 2021-07-20 DIAGNOSIS — Z7189 Other specified counseling: Secondary | ICD-10-CM | POA: Diagnosis not present

## 2021-07-20 LAB — CULTURE, BLOOD (ROUTINE X 2)
Culture: NO GROWTH
Culture: NO GROWTH
Special Requests: ADEQUATE
Special Requests: ADEQUATE

## 2021-07-20 LAB — CBC WITH DIFFERENTIAL/PLATELET
Abs Immature Granulocytes: 0.02 10*3/uL (ref 0.00–0.07)
Basophils Absolute: 0 10*3/uL (ref 0.0–0.1)
Basophils Relative: 0 %
Eosinophils Absolute: 0.1 10*3/uL (ref 0.0–0.5)
Eosinophils Relative: 2 %
HCT: 33.4 % — ABNORMAL LOW (ref 36.0–46.0)
Hemoglobin: 10.2 g/dL — ABNORMAL LOW (ref 12.0–15.0)
Immature Granulocytes: 1 %
Lymphocytes Relative: 23 %
Lymphs Abs: 0.8 10*3/uL (ref 0.7–4.0)
MCH: 27 pg (ref 26.0–34.0)
MCHC: 30.5 g/dL (ref 30.0–36.0)
MCV: 88.4 fL (ref 80.0–100.0)
Monocytes Absolute: 0.2 10*3/uL (ref 0.1–1.0)
Monocytes Relative: 7 %
Neutro Abs: 2.3 10*3/uL (ref 1.7–7.7)
Neutrophils Relative %: 67 %
Platelets: 144 10*3/uL — ABNORMAL LOW (ref 150–400)
RBC: 3.78 MIL/uL — ABNORMAL LOW (ref 3.87–5.11)
RDW: 17 % — ABNORMAL HIGH (ref 11.5–15.5)
WBC: 3.5 10*3/uL — ABNORMAL LOW (ref 4.0–10.5)
nRBC: 0.6 % — ABNORMAL HIGH (ref 0.0–0.2)

## 2021-07-20 LAB — COMPREHENSIVE METABOLIC PANEL
ALT: 22 U/L (ref 0–44)
AST: 27 U/L (ref 15–41)
Albumin: 2 g/dL — ABNORMAL LOW (ref 3.5–5.0)
Alkaline Phosphatase: 45 U/L (ref 38–126)
Anion gap: 6 (ref 5–15)
BUN: 14 mg/dL (ref 6–20)
CO2: 29 mmol/L (ref 22–32)
Calcium: 7.7 mg/dL — ABNORMAL LOW (ref 8.9–10.3)
Chloride: 98 mmol/L (ref 98–111)
Creatinine, Ser: 0.37 mg/dL — ABNORMAL LOW (ref 0.44–1.00)
GFR, Estimated: >60 mL/min (ref >60)
Glucose, Bld: 241 mg/dL — ABNORMAL HIGH (ref 70–99)
Potassium: 3.8 mmol/L (ref 3.5–5.1)
Sodium: 133 mmol/L — ABNORMAL LOW (ref 135–145)
Total Bilirubin: 0.8 mg/dL (ref 0.3–1.2)
Total Protein: 5.4 g/dL — ABNORMAL LOW (ref 6.5–8.1)

## 2021-07-20 LAB — GLUCOSE, CAPILLARY
Glucose-Capillary: 231 mg/dL — ABNORMAL HIGH (ref 70–99)
Glucose-Capillary: 230 mg/dL — ABNORMAL HIGH (ref 70–99)
Glucose-Capillary: 195 mg/dL — ABNORMAL HIGH (ref 70–99)
Glucose-Capillary: 217 mg/dL — ABNORMAL HIGH (ref 70–99)

## 2021-07-20 LAB — MAGNESIUM: Magnesium: 1.9 mg/dL (ref 1.7–2.4)

## 2021-07-20 MED ORDER — SODIUM CHLORIDE 0.9 % IV SOLN
150.0000 mg | Freq: Once | INTRAVENOUS | Status: AC
  Administered 2021-07-21: 150 mg via INTRAVENOUS
  Filled 2021-07-20: qty 5

## 2021-07-20 NOTE — Progress Notes (Signed)
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 She has undergone extensive treatment since initial diagnosis in May 2021.  She is followed outpatient by Dr. Marin Olp. Despite treatment, her cancer seems to have progressed and lately she does not appear to be tolerating treatment from a physical ability standpoint.  Her appetite has decreased and she has developed generalized edema with recurrent abdominal ascites and now with enlarging right pleural effusion seen on imaging.  Due to religious beliefs, patient and family continue to want full scope of care without consideration of scaling back treatment in efforts to focus on comfort.  Assessment and Plan * Acute respiratory failure with hypoxia (HCC) - due to large R pleural effusion which is presumed malignant as well - continue O2 - hold off on thoracentesis for now as patient not in any respiratory distress and fluid likely to re-accumulate; I have also informed daughter of this  Abdominal ascites - due to peritoneal carcinomatosis from pancreatic cancer - last paracentesis performed on 10/21 with 2.8 L removed - now back with recurrent abdominal pain/distension from fluid re-accumulation -Patient underwent repeat paracentesis on 07/16/2021 which removed 3.4 L fluid -I have informed daughter that this will also again continue to reaccumulate - abdomen already distended again on 11/1 with more discomfort on exam; likely would need repeat paracentesis in next 2-3 days  Stage IV adenocarcinoma of pancreas ([place]) - diagnosed 02/14/20 - s/p multiple chemo regimens and also saw oncology, Dr. Fanny Skates, at [facility] for 2nd opinion;  liposomal irinotecan was proposed but according to oncology notes this has been denied by insurance previously - now has peritoneal carcinomatosis, right pleural effusion (?malignant as well), RIJ DVT - furthermore, her functional and physical status is so poor, I highly doubt she is a good candidate for any further treatment other than focusing on comfort at this time; she was seen by Dr. Marin Olp 10/31, who was also in agreement with foregoing further chemo - palliative care consulted given patient's large decline; due to religious beliefs they wish to continue full code at this time. I've directly informed patient's daughter that the patient is imminently approaching end of life  Goals of care, counseling/discussion - Patient and family have now had multiple [place] discussions notably on admission with the admitting provider followed by myself today as well as with oncology and palliative care.  Unfortunately due to the patient's religious beliefs, they do not wish for any de-escalation of care at this time and wish to continue remaining full code.  I have directly informed her daughter that the patient's prognosis is poor and she may not survive hospitalization.   -Greatly appreciate palliative care assistance as well with the management of this patient - Daughter has spoke with Dr. Marin Olp on 07/19/2021, no further chemo was offered and prognosis was reiterated to be poor.  Main focus at this time is still centered around keeping patient comfortable as she is still actively approaching end-of-life.  Pleural effusion on right - See respiratory failure - Continue oxygen  Intravascular volume depletion - poor PO intake and ongoing N/V - poor PEG candidate and not likely to affect underlying prognosis  - FeNA is 0%, albumin 2.1; IVF likely  to 3rd space and albumin not likely to make a long term difference (e.g. fluids will worsen respiratory status and abdominal ascites) - aggressive medical care is  considered futile notably further chemo given such poor functional status   Hyponatremia - likely due to underlying cancer and/or poor intake - hold off in IVF due to high risk of 3rd spacing and further worsening of respiratory status and ascites   Internal jugular (IJ) vein thromboembolism, chronic, right (HCC) - Continue Xarelto  Essential hypertension, benign - BP meds on hold on admission due to low/normal blood pressure - Continue holding especially if further diuretic use and/or with further volume being removed on paracentesis  Type 2 diabetes mellitus ([place]) - continue SSI and CBG monitoring      Subjective:  Daughter and more visitors present bedside this morning.  No major changes since yesterday.  She continues to be very fatigued/lethargic with minimal interaction although daughter states that she wakes up for her and even eats some. Regardless, prognosis remains the same.   Objective Vitals:   07/20/21 0919 07/20/21 1000 07/20/21 1058 07/20/21 1200  BP: 130/75 139/85 (!) 143/90 (!) 145/87  Pulse: (!) 132 (!) 134 (!) 131 (!) 131  Resp: (!) 26 (!) 25 (!) 25 (!) 25  Temp: 98 F (36.7 C) 98.4 F (36.9 C) 98.2 F (36.8 C) 98.6 F (37 C)  TempSrc: Oral Axillary Axillary Axillary  SpO2: 99% 96% 95% 96%  Weight:      Height:       94.6 kg  Vital signs were reviewed and unremarkable.   Exam Physical Exam Constitutional:      Comments: Chronically ill, fatigued, frail woman laying in bed appearing fatigued/lethargic but in no distress  HENT:     Head: Normocephalic and atraumatic.     Comments: O2 [place] in place    Mouth/Throat:     Mouth: Mucous membranes are dry.  Eyes:     Extraocular Movements: Extraocular movements intact.  Cardiovascular:     Rate and Rhythm: Normal rate and regular rhythm.  Pulmonary:     Effort: Pulmonary effort is normal. No respiratory distress.     Breath sounds: No wheezing.  Abdominal:     Comments: More distended with  hypoactive bowel sounds and more discomfort nonspecifically on palpation  Musculoskeletal:     Cervical back: Normal range of motion and neck supple.     Comments: 2-3+ B/L LE pitting edema  Skin:    General: Skin is warm and dry.  Neurological:     Comments: Able to move all 4 extremities  Psychiatric:        Behavior: Behavior normal.     Labs / Other Information My review of labs, imaging, notes and other tests shows no new significant findings.    Disposition Plan: Status is: Inpatient  Remains inpatient appropriate because: ongoing treatment of above     Time spent: Greater than 50% of the 35 minute visit was spent in counseling/coordination of care for the patient as laid out in the A&P.  Dwyane Dee, MD [facility] 07/20/2021, 12:54 PM

## 2021-07-20 NOTE — Progress Notes (Signed)
Right upper extremity venous duplex has been completed. Preliminary results can be found in CV Proc through chart review.  Results were given to Sheria Lang at Dr. Antonieta Pert office.  07/20/21 2:05 PM Angela Wilson RVT

## 2021-07-20 NOTE — Progress Notes (Signed)
Daily Progress Note   Patient Name: Angela Wilson       Date: 07/20/2021 DOB: 1965-12-03  Age: 55 y.o. MRN#: [id number] Attending Physician: Dwyane Dee, MD Primary Care Physician: Just, Laurita Quint, FNP (Inactive) Admit Date: 07/08/2021  Reason for Consultation/Follow-up: Establishing goals of care  Subjective: I saw and examined Ms. Konecny this morning.  Her daughter was present at the bedside and was working on feeding her breakfast.  Her daughter reports that she has had been having a good morning and when questioned she denies pain.  I reviewed with her daughter regarding her clinical course and continued concern about her progression of disease and continued decline in her nutrition and functional status.  Her daughter reports understanding concerns of physicians and that multiple clinicians have told her that her mother's prognosis is limited at this point in time.  At the same time, she is hopeful that her mother will continue to improve through God's intervention.  We discussed their faith and the comfort and support they receive through God.  Her daughter expressed that family is invested in plan to continue with current interventions despite understanding concerns from her care team that she is continuing to decline.  With this in mind, however, she wants to work to have patient's son and daughter be able to come for compassionate visit to visit with her.  We discussed their application for visitation and she requested further documentation of illness and request for medical support for requested visas.  Length of Stay: 5  Current Medications: Scheduled Meds:   Chlorhexidine Gluconate Cloth  6 each Topical Daily   dronabinol  2.5 mg Oral BID AC   insulin aspart  0-9 Units  Subcutaneous TID WC   mouth rinse  15 mL Mouth Rinse BID   mirtazapine  30 mg Oral QHS   OLANZapine  10 mg Oral QHS   rivaroxaban  20 mg Oral Q supper   sodium chloride flush  3 mL Intravenous Q12H    Continuous Infusions:  [START ON 07/21/2021] fosaprepitant (EMEND) IV infusion 150 mg     promethazine (PHENERGAN) injection (IM or IVPB) 12.5 mg (07/20/21 1727)    PRN Meds: acetaminophen **OR** acetaminophen, glycopyrrolate, HYDROmorphone (DILAUDID) injection, HYDROmorphone, lip balm, LORazepam, ondansetron **OR** ondansetron (ZOFRAN) IV, prochlorperazine, promethazine (PHENERGAN) injection (IM or IVPB), senna-docusate, sodium  chloride flush  Physical Exam         Appears chronically ill fatigued Regular work of breathing Has O2 [place] Abdomen distended Has edema  Vital Signs: BP (!) 144/97 (BP Location: Right Arm)   Pulse (!) 137   Temp (!) 100.7 F (38.2 C) (Axillary)   Resp (!) 21   Ht 5\' 6"  (1.676 m)   Wt 94.6 kg   SpO2 96%   BMI 33.66 kg/m  SpO2: SpO2: 96 % O2 Device: O2 Device: Nasal Cannula O2 Flow Rate: O2 Flow Rate (L/min): 2 L/min  Intake/output summary:  Intake/Output Summary (Last 24 hours) at 07/20/2021 1738 Last data filed at 07/20/2021 1200 Gross per 24 hour  Intake 240 ml  Output 850 ml  Net -610 ml    LBM: Last BM Date: 07/16/21 Baseline Weight: Weight: 100.7 kg Most recent weight: Weight: 94.6 kg PPS 30%      Palliative Assessment/Data:      Patient Active Problem List   Diagnosis Date Noted   Intravascular volume depletion 07/18/2021   Internal jugular (IJ) vein thromboembolism, chronic, right ([place]) 07/16/2021   Goals of care, counseling/discussion 07/16/2021   Acute respiratory failure with hypoxia (HCC) 07/05/2021   Abdominal ascites 07/02/2021   Pleural effusion on right 06/23/2021   Hyponatremia 07/16/2021   Genetic testing 05/12/2020   Family history of breast cancer    Family history of ovarian cancer    Family history of kidney  cancer    Family history of brain cancer    Essential hypertension, benign 04/06/2020   Pancreatic cancer ([place]) 02/14/2020   Cancer associated pain 02/13/2020   Stage IV adenocarcinoma of pancreas ([place]) 02/03/2020   Screening for colon cancer 01/07/2020   Encounter for screening mammogram for malignant neoplasm of breast 01/07/2020   Need for Tdap vaccination 01/07/2020   Pancreatic mass 12/30/2019   Nephrolithiasis 12/20/2012   GERD (gastroesophageal reflux disease) 12/20/2012   Type 2 diabetes mellitus ([place]) 11/09/2012    Palliative Care Assessment & Plan   Patient Profile:    Assessment:  55 yo lady with life limiting illness of pancreatic cancer, ascites, abdominal pain, LE edema.   Recommendations/Plan: Continue current care.  Discussed with patient's daughter continue concerned that she is declining.  Family understands but have faith that God will intervene on her behalf. Now on IV Dilaudid PRN pain/shortness of breath as well as IV Robinul PRN secretions.  I agree with other providers that her prognosis is limited at this point in time.  She is not a candidate for further disease modifying therapy at this point due to overall deconditioning, weakness, and other medical conditions.   I completed documentation outlining her medical illness to support her son and daughter's applications for visitation visa.   Goals of Care and Additional Recommendations: Limitations on Scope of Treatment: Full Scope Treatment  Code Status:    Code Status Orders  (From admission, onward)           Start     Ordered   07/14/2021 2022  Full code  Continuous        07/05/2021 2024           Code Status History     Date Active Date Inactive Code Status Order ID Comments User Context   02/14/2020 0213 02/18/2020 2347 Full Code [id number]  Clance Boll, MD Inpatient       Prognosis: Likely weeks at best, she is at high risk for acute decompensation.  Discharge  Planning: To  Be Determined although there is a real possibility this will be a terminal admission.  Care plan was discussed with IDT.    Thank you for allowing the Palliative Medicine Team to assist in the care of this patient.   Total Time 40 Prolonged Time Billed  no    Greater than 50%  of this time was spent counseling and coordinating care related to the above assessment and plan.  Micheline Rough, MD  Please contact Palliative Medicine Team phone at [phone] for questions and concerns.

## 2021-07-20 NOTE — Progress Notes (Signed)
Overall, I am not sure there really is any change in her status.  Her daughter still has optimism that she is going to get better.  She does not seem to be having too much in the way of pain.  How much are much she really is able to eat.  They are apparently still neck pain.  I see about getting an ultrasound of her neck to see if the thrombus in the right jugular vein is still present.  She is not getting out of bed at all.  She is having some temperature the nighttime.  Last night, the temperature was 101 degrees.  I am not sure where this could be coming from although it would not surprise me this is from the malignancy itself.  However, I would probably check a urine culture on her.  Her labs show white cell count 3.5.  Hemoglobin 10.2.  Platelet count 144,000.  Her BUN is 14 creatinine 0.37.  Her glucose is 241.  I am really not surprised by this.  Overall, I just do not see much improvement.  Again, I would be shocked if we were able to even treat her again.  I know that her daughter would want this to be done.  I just am not sure what had she is going to be up for her anymore treatment.  I just want to make sure that her quality of life is our focus.  Apparently, she has family over in [place] who want to come over to be with her.  We will try to help out if we can with this.  I know that she is getting wonderful care from all staff up on [address].  I really do appreciate all of the efforts that are being made.  Lattie Haw, MD  1 Thessalonians 5:16-18

## 2021-07-20 NOTE — Progress Notes (Signed)
   07/20/21 0919  Assess: MEWS Score  Temp 98 F (36.7 C)  BP 130/75  Pulse Rate (!) 132  Resp (!) 26  SpO2 99 %  O2 Device Nasal Cannula  O2 Flow Rate (L/min) 2 L/min  Assess: MEWS Score  MEWS Temp 0  MEWS Systolic 0  MEWS Pulse 3  MEWS RR 2  MEWS LOC 0  MEWS Score 5  MEWS Score Color Red  Assess: if the MEWS score is Yellow or Red  Were vital signs taken at a resting state? Yes (Simultaneous filing. User may not have seen previous data.)  Focused Assessment No change from prior assessment (Simultaneous filing. User may not have seen previous data.)  Does the patient meet 2 or more of the SIRS criteria? Yes (Simultaneous filing. User may not have seen previous data.)  Does the patient have a confirmed or suspected source of infection? Yes (Simultaneous filing. User may not have seen previous data.)  Provider and Rapid Response Notified? Yes (Simultaneous filing. User may not have seen previous data.)  MEWS guidelines implemented *See Row Information* Yes (Simultaneous filing. User may not have seen previous data.)  Treat  MEWS Interventions Escalated (See documentation below)  Pain Scale 0-10  Pain Score Asleep  Take Vital Signs  Increase Vital Sign Frequency  Red: Q 1hr X 4 then Q 4hr X 4, if remains red, continue Q 4hrs (Simultaneous filing. User may not have seen previous data.)  Escalate  MEWS: Escalate Red: discuss with charge nurse/RN and provider, consider discussing with RRT (Simultaneous filing. User may not have seen previous data.)  Notify: Charge Nurse/RN  Name of Charge Nurse/RN Notified Tara, Therapist, sports (Simultaneous filing. User may not have seen previous data.)  Date Charge Nurse/RN Notified 07/20/21 (Simultaneous filing. User may not have seen previous data.)  Time Charge Nurse/RN Notified 0957 (Simultaneous filing. User may not have seen previous data.)  Notify: Provider  Provider Name/Title Gir[facility]is  Date Provider Notified 07/20/21  Time Provider Notified  [id number]  Notify: Rapid Response  Name of Rapid Response RN Notified RRT  Date Rapid Response Notified 07/20/21  Time Rapid Response Notified 0957  Document  Progress note created (see row info) Yes  Assess: SIRS CRITERIA  SIRS Temperature  0  SIRS Pulse 1  SIRS Respirations  1  SIRS WBC 0  SIRS Score Sum  2

## 2021-07-20 NOTE — Plan of Care (Signed)
  Problem: Education: Goal: Knowledge of General Education information will improve Description: Including pain rating scale, medication(s)/side effects and non-pharmacologic comfort measures Outcome: Progressing   Problem: Clinical Measurements: Goal: Diagnostic test results will improve Outcome: Progressing   Problem: Activity: Goal: Risk for activity intolerance will decrease Outcome: Progressing   

## 2021-07-20 DEATH — deceased

## 2021-07-21 ENCOUNTER — Inpatient Hospital Stay (HOSPITAL_COMMUNITY): Payer: 59

## 2021-07-21 DIAGNOSIS — Z515 Encounter for palliative care: Secondary | ICD-10-CM | POA: Diagnosis not present

## 2021-07-21 DIAGNOSIS — C252 Malignant neoplasm of tail of pancreas: Secondary | ICD-10-CM

## 2021-07-21 DIAGNOSIS — Z7189 Other specified counseling: Secondary | ICD-10-CM | POA: Diagnosis not present

## 2021-07-21 DIAGNOSIS — J9601 Acute respiratory failure with hypoxia: Secondary | ICD-10-CM | POA: Diagnosis not present

## 2021-07-21 LAB — CBC WITH DIFFERENTIAL/PLATELET
Abs Immature Granulocytes: 0.02 10*3/uL (ref 0.00–0.07)
Basophils Absolute: 0 10*3/uL (ref 0.0–0.1)
Basophils Relative: 0 %
Eosinophils Absolute: 0.2 10*3/uL (ref 0.0–0.5)
Eosinophils Relative: 4 %
HCT: 34.3 % — ABNORMAL LOW (ref 36.0–46.0)
Hemoglobin: 10.2 g/dL — ABNORMAL LOW (ref 12.0–15.0)
Immature Granulocytes: 1 %
Lymphocytes Relative: 22 %
Lymphs Abs: 0.9 10*3/uL (ref 0.7–4.0)
MCH: 26 pg (ref 26.0–34.0)
MCHC: 29.7 g/dL — ABNORMAL LOW (ref 30.0–36.0)
MCV: 87.5 fL (ref 80.0–100.0)
Monocytes Absolute: 0.3 10*3/uL (ref 0.1–1.0)
Monocytes Relative: 6 %
Neutro Abs: 2.7 10*3/uL (ref 1.7–7.7)
Neutrophils Relative %: 67 %
Platelets: 182 10*3/uL (ref 150–400)
RBC: 3.92 MIL/uL (ref 3.87–5.11)
RDW: 17.3 % — ABNORMAL HIGH (ref 11.5–15.5)
WBC: 4 10*3/uL (ref 4.0–10.5)
nRBC: 0 % (ref 0.0–0.2)

## 2021-07-21 LAB — GLUCOSE, CAPILLARY
Glucose-Capillary: 243 mg/dL — ABNORMAL HIGH (ref 70–99)
Glucose-Capillary: 209 mg/dL — ABNORMAL HIGH (ref 70–99)
Glucose-Capillary: 193 mg/dL — ABNORMAL HIGH (ref 70–99)
Glucose-Capillary: 153 mg/dL — ABNORMAL HIGH (ref 70–99)

## 2021-07-21 LAB — COMPREHENSIVE METABOLIC PANEL
ALT: 27 U/L (ref 0–44)
AST: 27 U/L (ref 15–41)
Albumin: 2 g/dL — ABNORMAL LOW (ref 3.5–5.0)
Alkaline Phosphatase: 47 U/L (ref 38–126)
Anion gap: 5 (ref 5–15)
BUN: 17 mg/dL (ref 6–20)
CO2: 29 mmol/L (ref 22–32)
Calcium: 7.5 mg/dL — ABNORMAL LOW (ref 8.9–10.3)
Chloride: 98 mmol/L (ref 98–111)
Creatinine, Ser: 0.41 mg/dL — ABNORMAL LOW (ref 0.44–1.00)
GFR, Estimated: >60 mL/min (ref >60)
Glucose, Bld: 261 mg/dL — ABNORMAL HIGH (ref 70–99)
Potassium: 3.8 mmol/L (ref 3.5–5.1)
Sodium: 132 mmol/L — ABNORMAL LOW (ref 135–145)
Total Bilirubin: 0.8 mg/dL (ref 0.3–1.2)
Total Protein: 5.4 g/dL — ABNORMAL LOW (ref 6.5–8.1)

## 2021-07-21 LAB — URINE CULTURE: Culture: <10000 — AB

## 2021-07-21 LAB — MAGNESIUM: Magnesium: 1.9 mg/dL (ref 1.7–2.4)

## 2021-07-21 MED ORDER — METOPROLOL TARTRATE 25 MG PO TABS
12.5000 mg | ORAL_TABLET | Freq: Two times a day (BID) | ORAL | Status: DC
  Administered 2021-07-21 – 2021-07-23 (×5): 12.5 mg via ORAL
  Filled 2021-07-21 (×6): qty 1

## 2021-07-21 MED ORDER — RIVAROXABAN 10 MG PO TABS
10.0000 mg | ORAL_TABLET | Freq: Every day | ORAL | Status: DC
  Administered 2021-07-21 – 2021-07-23 (×3): 10 mg via ORAL
  Filled 2021-07-21 (×4): qty 1

## 2021-07-21 MED ORDER — KETOROLAC TROMETHAMINE 15 MG/ML IJ SOLN
15.0000 mg | Freq: Three times a day (TID) | INTRAMUSCULAR | Status: AC
  Administered 2021-07-21 – 2021-07-22 (×5): 15 mg via INTRAVENOUS
  Filled 2021-07-21 (×5): qty 1

## 2021-07-21 MED ORDER — POLYETHYLENE GLYCOL 3350 17 G PO PACK
17.0000 g | PACK | Freq: Every day | ORAL | Status: DC
  Administered 2021-07-21 – 2021-07-23 (×3): 17 g via ORAL
  Filled 2021-07-21 (×3): qty 1

## 2021-07-21 MED ORDER — INDOMETHACIN 25 MG PO CAPS: 25.0000 mg | ORAL_CAPSULE | Freq: Two times a day (BID) | ORAL | Status: DC

## 2021-07-21 MED ORDER — BISACODYL 5 MG PO TBEC
10.0000 mg | DELAYED_RELEASE_TABLET | Freq: Every day | ORAL | Status: DC
  Administered 2021-07-21 – 2021-07-23 (×3): 10 mg via ORAL
  Filled 2021-07-21 (×3): qty 2

## 2021-07-21 MED ORDER — SODIUM CHLORIDE 0.9 % IV SOLN: 150.0000 mg | Freq: Once | INTRAVENOUS | Status: DC

## 2021-07-21 NOTE — Progress Notes (Signed)
Everything really is about the same with Angela Wilson.  She had a Doppler done.  This showed the persistent chronic thrombus in her right internal jugular vein.  I do not think this is really causing her neck pain.  We will see about getting some plain films of her neck.  She seems to be eating little bit better.  Her daughter says she ate 3 meals yesterday.  Not sure when she really is eating.  We did start her on Remeron.  I am unsure if this might be helping.  Seems like her pain is under decent control.  She has little bit more abdominal distention.  We might want to think about doing a paracentesis to see if this will help..  She has had no vomiting.  I do not know if the Emend that were given her is helping her.  She still has a very weak status.  I do not think she is really getting out of bed.  I am not sure  how ambulatory she really is.  Her labs today show white cell count of 4.  Hemoglobin 10.2.  Platelet count 182,000.  Her albumin is 2.0.  Her calcium is 7.5.  Her blood sugar is quite high at 261.  I still am very doubtful that we are going to be able to treat her.  I just do not think that she is going to be strong enough to take any more treatment.  Her daughter is still thinking that she is going to have treatment.  I know this is quite difficult for her.  I know that we have the been aggressive.  Sometimes, we just are not able to do therapy because the patient is not strong enough.  She still having temperatures.  Her temperature yesterday was 101.2.  Her heart rate is 130.  I do suspect that this is all from her malignancy.  We did send off a urine culture on her.  We will see if this is positive.  I will try her on some Indocin or possibly some Toradol to see if this might help.  Typically, for malignancy associated fevers, a nonsteroidal can help with the temperature spikes.  We will continue to follow her along.  Again, I know this is a very difficult for her family.  I  know they are with her and they are trying their best to get her better.  I am just not sure that we are going to be able to see that much improvement.  I know the staff on [address] are doing a fantastic job with her.  I appreciate their help in their compassion.  Angela Haw, MD  Revelation 21:4

## 2021-07-21 NOTE — Progress Notes (Signed)
Patient ID: Angela Wilson, female   DOB: 1966/01/24, 55 y.o.   MRN: [id number] Pt came down to [place] today for possible paracentesis. On limited US abd in all four quadrants there is small amount of ascites noted. Close proximity of bowel loops preclude safe needle placement at this time. Procedure cancelled. TRH updated.

## 2021-07-21 NOTE — Progress Notes (Signed)
PROGRESS NOTE    Perla Echavarria  PXT:[id number] DOB: [birth year] DOA: 06/20/2021 PCP: Just, Laurita Quint, FNP (Inactive)   Brief Narrative: 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 She has undergone extensive treatment since initial diagnosis in May 2021.  She is followed outpatient by Dr. Marin Olp. Despite treatment, her cancer seems to have progressed and lately she does not appear to be tolerating treatment from a physical ability standpoint.  Her appetite has decreased and she has developed generalized edema with recurrent abdominal ascites and now with enlarging right pleural effusion seen on imaging.  Due to religious beliefs, patient and family continue to want full scope of care without consideration of scaling back treatment in efforts to focus on comfort.  Assessment & Plan:   Principal Problem:   Acute respiratory failure with hypoxia (HCC) Active Problems:   Type 2 diabetes mellitus (HCC)   Stage IV adenocarcinoma of pancreas (HCC)   Essential hypertension, benign   Abdominal ascites   Pleural effusion on right   Hyponatremia   Internal jugular (IJ) vein thromboembolism, chronic, right (HCC)   Goals of care, counseling/discussion   Intravascular volume depletion  #1 stage IV adenocarcinoma of the pancreas diagnosed in May 2021 with mets to the peritoneum -patient has had multiple rounds of chemotherapy.  She is not a candidate for any chemotherapy at this time.  She was seen by Dr. Marin Olp.  Palliative care was consulted however due to religious believes patient's daughter believes that she is going to be healed.  We will continue discussions with the patient's daughter.  #2 acute hypoxic respiratory failure due to large right pleural effusion patient appears comfortable without any respiratory  distress at this time.  Thoracentesis was not done due to this reason. She is on oxygen at 1 L saturating 97%  #3 ascites patient was taken down to IR for possible paracentesis however she had very minimal amount of ascites fluid which was in close proximity to bowels so this was not attempted.  #4 goals of care patient remains a full code with very poor prognosis.  #5 tachycardia-multifactorial I have started her on Lopressor today.  #6 right IJ thrombus on Xarelto  #7 tumor fever we will try NSAIDs  Estimated body mass index is 33.8 kg/m as calculated from the following:   Height as of this encounter: 5\' 6"  (1.676 m).   Weight as of this encounter: 95 kg.  DVT prophylaxis: Xarelto  code Status: Full code Family Communication: Discussed with daughter at bedside disposition Plan:  Status is: Inpatient  Remains inpatient appropriate because: Tachycardic with stage IV pancreatic CA with mets  Consultants:  Oncology  Procedures: Paracentesis Antimicrobials: None  Subjective: Daughter by the bedside She stays 24/7 in the hospital with her mother X-rays of the neck shows no acute findings Patient does have known right IJ thrombus Daughter complains that patient has complaints of right-sided neck pain  Objective: Vitals:   07/21/21 0531 07/21/21 0631 07/21/21 1003 07/21/21 1523  BP:   137/76 (!) 160/95  Pulse:    (!) 127  Resp:    (!) 24  Temp: (!) 100.5 F (38.1 C)  98.1 F (36.7 C) 98 F (36.7 C)  TempSrc: Oral   Axillary  SpO2:      Weight:  95 kg    Height:  Intake/Output Summary (Last 24 hours) at 07/21/2021 1614 Last data filed at 07/21/2021 0650 Gross per 24 hour  Intake 240 ml  Output 1000 ml  Net -760 ml   Filed Weights   07/19/21 0530 07/20/21 0500 07/21/21 0631  Weight: 94.3 kg 94.6 kg 95 kg    Examination: Tachycardic  General exam: Appears uncomfortable and ill appearing Respiratory system: Diminished the bases to auscultation.  Respiratory effort normal. Cardiovascular system: S1 & S2 heard, RRR. No JVD, murmurs, rubs, gallops or clicks.  Gastrointestinal system: Abdomen is distended, soft and nontender. No organomegaly or masses felt. Normal bowel sounds heard. Central nervous system: Alert and oriented. No focal neurological deficits. Extremities: Symmetric 5 x 5 power. Skin: No rashes, lesions or ulcers Psychiatry: Judgement and insight appear normal. Mood & affect appropriate.     Data Reviewed: I have personally reviewed following labs and imaging studies  CBC: Recent Labs  Lab 07/17/21 0523 07/18/21 0353 07/19/21 0408 07/20/21 0334 07/21/21 0514  WBC 5.0 3.8* 3.8* 3.5* 4.0  NEUTROABS 3.9 2.7 2.7 2.3 2.7  HGB 10.6* 11.0* 10.2* 10.2* 10.2*  HCT 35.2* 35.4* 33.1* 33.4* 34.3*  MCV 86.9 85.7 86.0 88.4 87.5  PLT 163 155 145* 144* 716   Basic Metabolic Panel: Recent Labs  Lab 07/17/21 0523 07/18/21 0353 07/19/21 0408 07/20/21 0334 07/21/21 0514  NA 130* 132* 131* 133* 132*  K 4.0 4.1 3.8 3.8 3.8  CL 97* 95* 97* 98 98  CO2 28 28 27 29 29   GLUCOSE 225* 232* 239* 241* 261*  BUN 14 13 14 14 17   CREATININE 0.49 0.35* 0.46 0.37* 0.41*  CALCIUM 7.8* 7.9* 7.5* 7.7* 7.5*  MG 2.0 2.0 1.8 1.9 1.9   GFR: Estimated Creatinine Clearance: 94.5 mL/min (A) (by C-G formula based on SCr of 0.41 mg/dL (L)). Liver Function Tests: Recent Labs  Lab 07/17/21 0523 07/18/21 0353 07/19/21 0408 07/20/21 0334 07/21/21 0514  AST 14* 16 18 27 27   ALT 13 14 16 22 27   ALKPHOS 47 53 43 45 47  BILITOT 0.9 0.8 0.7 0.8 0.8  PROT 5.5* 5.8* 5.3* 5.4* 5.4*  ALBUMIN 2.0* 2.1* 1.8* 2.0* 2.0*   No results for input(s): LIPASE, AMYLASE in the last 168 hours. No results for input(s): AMMONIA in the last 168 hours. Coagulation Profile: Recent Labs  Lab 07/10/2021 1340  INR 1.8*   Cardiac Enzymes: No results for input(s): CKTOTAL, CKMB, CKMBINDEX, TROPONINI in the last 168 hours. BNP (last 3 results) No results for  input(s): PROBNP in the last 8760 hours. HbA1C: No results for input(s): HGBA1C in the last 72 hours. CBG: Recent Labs  Lab 07/20/21 1124 07/20/21 1650 07/20/21 2159 07/21/21 0814 07/21/21 1143  GLUCAP 230* 195* 217* 243* 209*   Lipid Profile: No results for input(s): CHOL, HDL, LDLCALC, TRIG, CHOLHDL, LDLDIRECT in the last 72 hours. Thyroid Function Tests: No results for input(s): TSH, T4TOTAL, FREET4, T3FREE, THYROIDAB in the last 72 hours. Anemia Panel: No results for input(s): VITAMINB12, FOLATE, FERRITIN, TIBC, IRON, RETICCTPCT in the last 72 hours. Sepsis Labs: Recent Labs  Lab 07/08/2021 1340  LATICACIDVEN 0.9    Recent Results (from the past 240 hour(s))  Culture, blood (Routine x 2)     Status: None   Collection Time: 07/17/2021  1:45 PM   Specimen: Chest; Blood  Result Value Ref Range Status   Specimen Description   Final    CHEST RIGHT PORTA CATH Performed at [facility], [address] [place], [place] [address]    Special Requests   Final    BOTTLES DRAWN AEROBIC AND ANAEROBIC Blood Culture adequate volume Performed at [facility], [address] [place], [place] [address]    Culture   Final    NO GROWTH 5 DAYS Performed at [facility], [place] [address], [place], [place] [address]    Report Status 07/20/2021 FINAL  Final  Culture, blood (Routine x 2)     Status: None   Collection Time: 07/03/2021  2:00 PM   Specimen: BLOOD RIGHT HAND  Result Value Ref Range Status   Specimen Description   Final    BLOOD RIGHT HAND Performed at [facility], [address] [place], [place] [address]    Special Requests   Final    BOTTLES DRAWN AEROBIC AND ANAEROBIC Blood Culture adequate volume Performed at [facility], [address] [place], [place] [address]    Culture   Final    NO GROWTH 5 DAYS Performed at [facility], [place] [address], [place], [place] [address]    Report Status 07/20/2021 FINAL   Final  Resp Panel by RT-PCR (Flu A&B, Covid) Nasopharyngeal Swab     Status: None   Collection Time: 07/04/2021  3:44 PM   Specimen: Nasopharyngeal Swab; Nasopharyngeal(NP) swabs in vial transport medium  Result Value Ref Range Status   SARS Coronavirus 2 by RT PCR NEGATIVE NEGATIVE Final    Comment: (NOTE) SARS-CoV-2 target nucleic acids are NOT DETECTED.  The SARS-CoV-2 RNA is generally detectable in upper respiratory specimens during the acute phase of infection. The lowest concentration of SARS-CoV-2 viral copies this assay can detect is 138 copies/mL. A negative result does not preclude SARS-Cov-2 infection and should not be used as the sole basis for treatment or other patient management decisions. A negative result may occur with  improper specimen collection/handling, submission of specimen other than nasopharyngeal swab, presence of viral mutation(s) within the areas targeted by this assay, and inadequate number of viral copies(<138 copies/mL). A negative result must be combined with clinical observations, patient history, and epidemiological information. The expected result is Negative.  Fact Sheet for Patients:  EntrepreneurPulse.com.au  Fact Sheet for Healthcare Providers:  IncredibleEmployment.be  This test is no t yet approved or cleared by the [place] FDA and  has been authorized for detection and/or diagnosis of SARS-CoV-2 by FDA under an Emergency Use Authorization (EUA). This EUA will remain  in effect (meaning this test can be used) for the duration of the COVID-19 declaration under Section 564(b)(1) of the Act, 21 U.S.C.section 360bbb-3(b)(1), unless the authorization is terminated  or revoked sooner.       Influenza A by PCR NEGATIVE NEGATIVE Final   Influenza B by PCR NEGATIVE NEGATIVE Final    Comment: (NOTE) The Xpert Xpress SARS-CoV-2/FLU/RSV plus assay is intended as an aid in the diagnosis of influenza from  Nasopharyngeal swab specimens and should not be used as a sole basis for treatment. Nasal washings and aspirates are unacceptable for Xpert Xpress SARS-CoV-2/FLU/RSV testing.  Fact Sheet for Patients: EntrepreneurPulse.com.au  Fact Sheet for Healthcare Providers: IncredibleEmployment.be  This test is not yet approved or cleared by the [place] FDA and has been authorized for detection and/or diagnosis of SARS-CoV-2 by FDA under an Emergency Use Authorization (EUA). This EUA will remain in effect (meaning this test can be used) for the duration of the COVID-19 declaration under Section 564(b)(1)  of the Act, 21 U.S.C. section 360bbb-3(b)(1), unless the authorization is terminated or revoked.  Performed at [facility], [address], [place], [place] [address]   Urine Culture     Status: Abnormal   Collection Time: 07/20/21 12:29 PM   Specimen: Urine, Catheterized  Result Value Ref Range Status   Specimen Description   Final    URINE, CATHETERIZED Performed at [facility] [address] [place], [place] [address]    Special Requests   Final    Immunocompromised Performed at [facility], [place] [address], [place], [place] [address]    Culture (A)  Final    <10,000 COLONIES/mL INSIGNIFICANT GROWTH Performed at [facility] [address] [place],  [address]    Report Status 07/21/2021 FINAL  Final         Radiology Studies: [place] ASCITES (ABDOMEN LIMITED)  Result Date: 07/21/2021 CLINICAL DATA:  55 year old female with history of pancreatic cancer and ascites. EXAM: LIMITED ABDOMEN ULTRASOUND FOR ASCITES TECHNIQUE: Limited ultrasound survey for ascites was performed in all four abdominal quadrants. COMPARISON:  07/16/2021 FINDINGS: Trace ascites in the right upper, left upper, and right lower quadrants. No significant ascites in the left lower quadrant. IMPRESSION: Trace  ascites.  No safe window with for paracentesis at this time. Ruthann Cancer, MD [facility] Radiology Electronically Signed   By: Ruthann Cancer M.D.   On: 07/21/2021 16:10   VAS [place] UPPER EXTREMITY VENOUS DUPLEX  Result Date: 07/20/2021 UPPER VENOUS STUDY  Patient Name:  CHARL WELLEN  Date of Exam:   07/20/2021 Medical Rec #: [id number]          Accession #:    [id number] Date of Birth: 11/30/1965           Patient Gender: F Patient Age:   5 years Exam [facility]  [facility] Procedure:      VAS [place] UPPER EXTREMITY VENOUS DUPLEX Referring Phys: Burney Gauze --------------------------------------------------------------------------------  Indications: Pain Risk Factors: Cancer. Comparison Study: 04/20/2021 - R IJV, and Innominate vein DVT Performing Technologist: Oliver Hum RVT  Examination Guidelines: A complete evaluation includes B-mode imaging, spectral Doppler, color Doppler, and power Doppler as needed of all accessible portions of each vessel. Bilateral testing is considered an integral part of a complete examination. Limited examinations for reoccurring indications may be performed as noted.  Right Findings: +----------+------------+---------+-----------+----------+-------+ RIGHT     CompressiblePhasicitySpontaneousPropertiesSummary +----------+------------+---------+-----------+----------+-------+ IJV           None       No        No               Chronic +----------+------------+---------+-----------+----------+-------+ Subclavian    Full       Yes       Yes                      +----------+------------+---------+-----------+----------+-------+ Axillary      Full       Yes       Yes                      +----------+------------+---------+-----------+----------+-------+ Brachial      Full       Yes       Yes                      +----------+------------+---------+-----------+----------+-------+ Radial  Full                                          +----------+------------+---------+-----------+----------+-------+ Ulnar         Full                                          +----------+------------+---------+-----------+----------+-------+ Cephalic      Full                                          +----------+------------+---------+-----------+----------+-------+ Basilic       Full                                          +----------+------------+---------+-----------+----------+-------+  Left Findings: +----------+------------+---------+-----------+----------+-------+ LEFT      CompressiblePhasicitySpontaneousPropertiesSummary +----------+------------+---------+-----------+----------+-------+ Subclavian    Full       Yes       Yes                      +----------+------------+---------+-----------+----------+-------+  Summary:  Right: No evidence of superficial vein thrombosis in the upper extremity. 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  Left: No evidence of thrombosis in the subclavian.  *See table(s) above for measurements and observations.  Diagnosing physician: Deitra Mayo MD Electronically signed by Deitra Mayo MD on 07/20/2021 at 5:08:16 PM.    Final         Scheduled Meds:  Chlorhexidine Gluconate Cloth  6 each Topical Daily   dronabinol  2.5 mg Oral BID AC   insulin aspart  0-9 Units Subcutaneous TID WC   ketorolac  15 mg Intravenous Q8H   mouth rinse  15 mL Mouth Rinse BID   metoprolol tartrate  12.5 mg Oral BID   mirtazapine  30 mg Oral QHS   OLANZapine  10 mg Oral QHS   rivaroxaban  10 mg Oral Q supper   sodium chloride flush  3 mL Intravenous Q12H   Continuous Infusions:  promethazine (PHENERGAN) injection (IM or IVPB) Stopped (07/20/21 1745)     LOS: 6 days    Georgette Shell, MD 07/21/2021, 4:14 PM

## 2021-07-22 DIAGNOSIS — J9601 Acute respiratory failure with hypoxia: Secondary | ICD-10-CM | POA: Diagnosis not present

## 2021-07-22 LAB — GLUCOSE, CAPILLARY
Glucose-Capillary: 214 mg/dL — ABNORMAL HIGH (ref 70–99)
Glucose-Capillary: 205 mg/dL — ABNORMAL HIGH (ref 70–99)
Glucose-Capillary: 191 mg/dL — ABNORMAL HIGH (ref 70–99)
Glucose-Capillary: 182 mg/dL — ABNORMAL HIGH (ref 70–99)

## 2021-07-22 MED ORDER — LACTULOSE 10 GM/15ML PO SOLN
20.0000 g | Freq: Four times a day (QID) | ORAL | Status: AC
  Administered 2021-07-22 (×2): 20 g via ORAL
  Filled 2021-07-22 (×2): qty 30

## 2021-07-22 MED ORDER — ENSURE ENLIVE PO LIQD
237.0000 mL | Freq: Two times a day (BID) | ORAL | Status: DC
  Administered 2021-07-23: 237 mL via ORAL

## 2021-07-22 NOTE — Progress Notes (Signed)
Pt requested to get up to [facility] and to wash up. Pt assisted to BSC, max assist (pt able to help less than 10%). Pt voided approximately 81ml and had a very small smear of BM. Pt sat up about 10 minutes for bath, unable to support her head. Lifted back to bed. Bladder scan performed, 126ml visualized. Dr. Zigmund Daniel aware. Skin in excellent condition with no signs of pressure or shear. Family very eager to assist. Will continue to monitor.  Coolidge Breeze, RN 07/22/2021

## 2021-07-22 NOTE — Progress Notes (Addendum)
11/2 2100 Daughter has been at bedside continuously since admission. Daughter concerned that patient has not voided since this morning when primary nurse assisted patient to [facility]. No documented output noted for day shift. Bladder scan completed showing 206 cc in bladder- no action taken.  11/3 0530 Patient has not voided overnight. Denies abd pain/distention/need to void. Bladder scan performed and it revealed volume > 450cc. In and out catherization performed per protocol. 600 cc tea colored urine retrieved.   Clarene Essex NP notified on interventions. Primary RN will alert oncoming RN to overnight events.

## 2021-07-22 NOTE — Progress Notes (Signed)
Everything is about the same for [name] there was none of ascites to be taken off yesterday.  She did have the neck x-ray yesterday.  The cervical spine x-rays were unremarkable.  Patient had abdominal x-ray.  This did not show any obstruction.  Urine culture shows less than 10,000 colonies.  We will have to see if anything grows out.  She still has some small temperature spikes.  I do believe this is from underlying malignancy.  We will see what the urine culture shows.  She is eating.  It is hard to say how much she is eating.  Her daughter seems to be encouraged by what she is taking in.  We really need to try to get her out of bed.  We need to try to get her to sit in a chair.  If we are going to have any chance of treating her again, we will going to have to get her more active.  She is still tachycardic.  However she is not hypotensive.  Her T-max yesterday was 100.7 degrees.  Currently, temperature is 99.  Pulse 122.  Blood pressure 141/78.  Oxygen saturation is 93% on room air.  Her lungs are clear bilaterally.  She has good air movement bilaterally.  Cardiac exam tachycardic but regular.  Abdomen is somewhat obese.  Abdomen is soft.  There is no obvious fluid wave.  There may be a little bit of firmness over on the left side of the abdomen.  I cannot palpate her liver or spleen tip.  Extremity shows no clubbing, cyanosis or edema.  Again, the real question is when she will be able to go home.  I just do not think she is able to go home right now.  She is very weak.  She really needs some physical therapy if possible.  We will have to see what the urine culture shows.  I would hold off on antibiotics unless the urine culture is positive.  It be nice to have some lab work on her.  We will see about getting some tomorrow.  I know her daughter is with her all the time.  Her daughter is doing a great job trying to help out her mom.  I do appreciate the staff up on [address] with all  their outstanding care and compassion.  Lattie Haw, MD  2 Chronicles 20:15

## 2021-07-22 NOTE — Progress Notes (Addendum)
PROGRESS NOTE    Reeta Kuk  BPZ:[id number] DOB: 11-16-1965 DOA: 07/05/2021 PCP: Just, Laurita Quint, FNP (Inactive)   Brief Narrative: 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 She has undergone extensive treatment since initial diagnosis in May 2021.  She is followed outpatient by Dr. Marin Olp. Despite treatment, her cancer seems to have progressed and lately she does not appear to be tolerating treatment from a physical ability standpoint.  Her appetite has decreased and she has developed generalized edema with recurrent abdominal ascites and now with enlarging right pleural effusion seen on imaging.  Due to religious beliefs, patient and family continue to want full scope of care without consideration of scaling back treatment in efforts to focus on comfort.  Assessment & Plan:   Principal Problem:   Acute respiratory failure with hypoxia (HCC) Active Problems:   Type 2 diabetes mellitus (HCC)   Stage IV adenocarcinoma of pancreas (HCC)   Essential hypertension, benign   Abdominal ascites   Pleural effusion on right   Hyponatremia   Internal jugular (IJ) vein thromboembolism, chronic, right (HCC)   Goals of care, counseling/discussion   Intravascular volume depletion  #1 stage IV adenocarcinoma of the pancreas diagnosed in May 2021 with mets to the peritoneum -patient has had multiple rounds of chemotherapy.  She is not a candidate for any chemotherapy at this time.  She was seen by Dr. Marin Olp.  Palliative care was consulted however due to religious believes patient's daughter believes that she is going to be healed.  We will continue discussions with the patient's daughter.  #2 acute hypoxic respiratory failure due to large right pleural effusion patient appears comfortable without any respiratory  distress at this time.  Thoracentesis was not done due to this reason. She is on oxygen at 1 L saturating 97%  #3 ascites patient was taken down to IR for possible paracentesis however she had very minimal amount of ascites fluid which was in close proximity to bowels so this was not attempted.  #4 goals of care patient remains a full code with very poor prognosis.  #5 tachycardia-multifactorial I have started her on Lopressor today.  #6 right IJ thrombus on Xarelto  #7  Fever temperature curve coming down no evidence of infection noted so far.  This is likely tumor fever.  Continue Toradol. Estimated body mass index is 33.73 kg/m as calculated from the following:   Height as of this encounter: 5\' 6"  (1.676 m).   Weight as of this encounter: 94.8 kg.  DVT prophylaxis: Xarelto  code Status: Full code Family Communication: Discussed with daughter at bedside disposition Plan:  Status is: Inpatient  Remains inpatient appropriate because: Tachycardic with stage IV pancreatic CA with mets  Consultants:  Oncology  Procedures: Paracentesis Antimicrobials: None  Subjective:  She is resting in bed overnight had in and out catheter placed due to  more than 300 cc of urine by bladder scan Daughter by the bedside her son visited her yesterday she is concerned about her brothers who live in [place] who cannot come and visit her mother. She denies shortness of breath No bowel movement for over 2 days Eating a little better per daughter she likes to drink the shakes Her activities are limited by tachycardia She is extremely weak and bedridden It takes a lot to get out of bed however with  any movement she became more tachycardic and short of breath Her heart rate resting is at 130  Objective: Vitals:   07/22/21 0500 07/22/21 0600 07/22/21 0947 07/22/21 1338  BP:   131/72 135/77  Pulse:   (!) 132 (!) 128  Resp:   (!) 25 (!) 26  Temp:   99 F (37.2 C) 98 F (36.7 C)  TempSrc:    Axillary Oral  SpO2:   96% 95%  Weight: 97.2 kg 94.8 kg    Height:        Intake/Output Summary (Last 24 hours) at 07/22/2021 1400 Last data filed at 07/22/2021 1300 Gross per 24 hour  Intake 350 ml  Output 600 ml  Net -250 ml    Filed Weights   07/21/21 0631 07/22/21 0500 07/22/21 0600  Weight: 95 kg 97.2 kg 94.8 kg    Examination: Tachycardic  General exam: Appears uncomfortable and ill appearing Respiratory system: Diminished the bases to auscultation. Respiratory effort normal. Cardiovascular system: S1 & S2 heard, RRR. No JVD, murmurs, rubs, gallops or clicks.  Gastrointestinal system: Abdomen is distended, soft and nontender. No organomegaly or masses felt. Normal bowel sounds heard. Central nervous system: Alert and oriented. No focal neurological deficits. Extremities: Symmetric 5 x 5 power. Skin: No rashes, lesions or ulcers Psychiatry: Judgement and insight appear normal. Mood & affect appropriate.     Data Reviewed: I have personally reviewed following labs and imaging studies  CBC: Recent Labs  Lab 07/17/21 0523 07/18/21 0353 07/19/21 0408 07/20/21 0334 07/21/21 0514  WBC 5.0 3.8* 3.8* 3.5* 4.0  NEUTROABS 3.9 2.7 2.7 2.3 2.7  HGB 10.6* 11.0* 10.2* 10.2* 10.2*  HCT 35.2* 35.4* 33.1* 33.4* 34.3*  MCV 86.9 85.7 86.0 88.4 87.5  PLT 163 155 145* 144* 983    Basic Metabolic Panel: Recent Labs  Lab 07/17/21 0523 07/18/21 0353 07/19/21 0408 07/20/21 0334 07/21/21 0514  NA 130* 132* 131* 133* 132*  K 4.0 4.1 3.8 3.8 3.8  CL 97* 95* 97* 98 98  CO2 28 28 27 29 29   GLUCOSE 225* 232* 239* 241* 261*  BUN 14 13 14 14 17   CREATININE 0.49 0.35* 0.46 0.37* 0.41*  CALCIUM 7.8* 7.9* 7.5* 7.7* 7.5*  MG 2.0 2.0 1.8 1.9 1.9    GFR: Estimated Creatinine Clearance: 94.4 mL/min (A) (by C-G formula based on SCr of 0.41 mg/dL (L)). Liver Function Tests: Recent Labs  Lab 07/17/21 0523 07/18/21 0353 07/19/21 0408 07/20/21 0334 07/21/21 0514  AST 14* 16 18 27  27   ALT 13 14 16 22 27   ALKPHOS 47 53 43 45 47  BILITOT 0.9 0.8 0.7 0.8 0.8  PROT 5.5* 5.8* 5.3* 5.4* 5.4*  ALBUMIN 2.0* 2.1* 1.8* 2.0* 2.0*    No results for input(s): LIPASE, AMYLASE in the last 168 hours. No results for input(s): AMMONIA in the last 168 hours. Coagulation Profile: No results for input(s): INR, PROTIME in the last 168 hours.  Cardiac Enzymes: No results for input(s): CKTOTAL, CKMB, CKMBINDEX, TROPONINI in the last 168 hours. BNP (last 3 results) No results for input(s): PROBNP in the last 8760 hours. HbA1C: No results for input(s): HGBA1C in the last 72 hours. CBG: Recent Labs  Lab 07/21/21 1143 07/21/21 1719 07/21/21 2039 07/22/21 0746 07/22/21 1210  GLUCAP 209* 193* 153* 214* 205*    Lipid Profile: No results for input(s): CHOL, HDL, LDLCALC, TRIG, CHOLHDL, LDLDIRECT in the last 72 hours. Thyroid Function Tests: No results for input(s): TSH, T4TOTAL, FREET4, T3FREE, THYROIDAB  in the last 72 hours. Anemia Panel: No results for input(s): VITAMINB12, FOLATE, FERRITIN, TIBC, IRON, RETICCTPCT in the last 72 hours. Sepsis Labs: No results for input(s): PROCALCITON, LATICACIDVEN in the last 168 hours.   Recent Results (from the past 240 hour(s))  Culture, blood (Routine x 2)     Status: None   Collection Time: 07/09/2021  1:45 PM   Specimen: Chest; Blood  Result Value Ref Range Status   Specimen Description   Final    CHEST RIGHT PORTA CATH Performed at [facility], [address] [place], [place] [address]    Special Requests   Final    BOTTLES DRAWN AEROBIC AND ANAEROBIC Blood Culture adequate volume Performed at [facility], [address] [place], [place] [address]    Culture   Final    NO GROWTH 5 DAYS Performed at [facility], [place] [address] [place], [place] [address]    Report Status 07/20/2021 FINAL  Final  Culture, blood (Routine x 2)     Status: None   Collection Time: 07/02/2021  2:00 PM   Specimen:  BLOOD RIGHT HAND  Result Value Ref Range Status   Specimen Description   Final    BLOOD RIGHT HAND Performed at [facility], [address] [place], [place] [address]    Special Requests   Final    BOTTLES DRAWN AEROBIC AND ANAEROBIC Blood Culture adequate volume Performed at [facility], [address] [place], [place] [address]    Culture   Final    NO GROWTH 5 DAYS Performed at [facility], [place] [address] [place], [place] [address]    Report Status 07/20/2021 FINAL  Final  Resp Panel by RT-PCR (Flu A&B, Covid) Nasopharyngeal Swab     Status: None   Collection Time: 07/09/2021  3:44 PM   Specimen: Nasopharyngeal Swab; Nasopharyngeal(NP) swabs in vial transport medium  Result Value Ref Range Status   SARS Coronavirus 2 by RT PCR NEGATIVE NEGATIVE Final    Comment: (NOTE) SARS-CoV-2 target nucleic acids are NOT DETECTED.  The SARS-CoV-2 RNA is generally detectable in upper respiratory specimens during the acute phase of infection. The lowest concentration of SARS-CoV-2 viral copies this assay can detect is 138 copies/mL. A negative result does not preclude SARS-Cov-2 infection and should not be used as the sole basis for treatment or other patient management decisions. A negative result may occur with  improper specimen collection/handling, submission of specimen other than nasopharyngeal swab, presence of viral mutation(s) within the areas targeted by this assay, and inadequate number of viral copies(<138 copies/mL). A negative result must be combined with clinical observations, patient history, and epidemiological information. The expected result is Negative.  Fact Sheet for Patients:  EntrepreneurPulse.com.au  Fact Sheet for Healthcare Providers:  IncredibleEmployment.be  This test is no t yet approved or cleared by the [place] FDA and  has been authorized for detection and/or diagnosis of  SARS-CoV-2 by FDA under an Emergency Use Authorization (EUA). This EUA will remain  in effect (meaning this test can be used) for the duration of the COVID-19 declaration under Section 564(b)(1) of the Act, 21 U.S.C.section 360bbb-3(b)(1), unless the authorization is terminated  or revoked sooner.       Influenza A by PCR NEGATIVE NEGATIVE Final   Influenza B by PCR NEGATIVE NEGATIVE Final    Comment: (NOTE) The Xpert Xpress SARS-CoV-2/FLU/RSV plus assay is intended as  an aid in the diagnosis of influenza from Nasopharyngeal swab specimens and should not be used as a sole basis for treatment. Nasal washings and aspirates are unacceptable for Xpert Xpress SARS-CoV-2/FLU/RSV testing.  Fact Sheet for Patients: EntrepreneurPulse.com.au  Fact Sheet for Healthcare Providers: IncredibleEmployment.be  This test is not yet approved or cleared by the [place] FDA and has been authorized for detection and/or diagnosis of SARS-CoV-2 by FDA under an Emergency Use Authorization (EUA). This EUA will remain in effect (meaning this test can be used) for the duration of the COVID-19 declaration under Section 564(b)(1) of the Act, 21 U.S.C. section 360bbb-3(b)(1), unless the authorization is terminated or revoked.  Performed at [facility], [address] [place], [place] [address]   Urine Culture     Status: Abnormal   Collection Time: 07/20/21 12:29 PM   Specimen: Urine, Catheterized  Result Value Ref Range Status   Specimen Description   Final    URINE, CATHETERIZED Performed at [facility] [address], [place], [place] [address]    Special Requests   Final    Immunocompromised Performed at [facility], [place] [address], [place], [place] [address]    Culture (A)  Final    <10,000 COLONIES/mL INSIGNIFICANT GROWTH Performed at [facility] [address] [place], [place] [address]     Report Status 07/21/2021 FINAL  Final          Radiology Studies: DG Cervical Spine 2 or 3 views  Result Date: 07/21/2021 CLINICAL DATA:  Pancreatic cancer with metastases. EXAM: CERVICAL SPINE - 2-3 VIEW COMPARISON:  None. FINDINGS: Lateral view includes skull base through mid C6. There is straightening of normal cervical lordosis. There is no evidence for subluxation. Disc spaces are maintained. There is no acute fracture. No focal osseous lesions are identified. There is no prevertebral soft tissue swelling. Catheter is partially visualized overlying the right chest. IMPRESSION: Negative cervical spine radiographs. Electronically Signed   By: Ronney Asters M.D.   On: 07/21/2021 16:18   DG Abd 1 View  Result Date: 07/21/2021 CLINICAL DATA:  Distended abdomen. EXAM: ABDOMEN - 1 VIEW COMPARISON:  None. FINDINGS: The bowel gas pattern is normal. There is a calcification in the left hemipelvis, likely a phlebolith. No acute fractures are seen. IMPRESSION: Negative. Electronically Signed   By: Ronney Asters M.D.   On: 07/21/2021 16:58   [place] ASCITES (ABDOMEN LIMITED)  Result Date: 07/21/2021 CLINICAL DATA:  55 year old female with history of pancreatic cancer and ascites. EXAM: LIMITED ABDOMEN ULTRASOUND FOR ASCITES TECHNIQUE: Limited ultrasound survey for ascites was performed in all four abdominal quadrants. COMPARISON:  07/16/2021 FINDINGS: Trace ascites in the right upper, left upper, and right lower quadrants. No significant ascites in the left lower quadrant. IMPRESSION: Trace ascites.  No safe window with for paracentesis at this time. Ruthann Cancer, MD [facility] Radiology Electronically Signed   By: Ruthann Cancer M.D.   On: 07/21/2021 16:10   VAS [place] UPPER EXTREMITY VENOUS DUPLEX  Result Date: 07/20/2021 UPPER VENOUS STUDY  Patient Name:  SEYMONE FORLENZA  Date of Exam:   07/20/2021 Medical Rec #: [id number]          Accession #:     [id number] Date of Birth: 1966-07-22           Patient Gender: F Patient Age:   62 years Exam Location:  [facility] Procedure:  VAS [place] UPPER EXTREMITY VENOUS DUPLEX Referring Phys: Collier [place] ENNEVER --------------------------------------------------------------------------------  Indications: Pain Risk Factors: Cancer. Comparison Study: 04/20/2021 - R IJV, and Innominate vein DVT Performing Technologist: Oliver Hum RVT  Examination Guidelines: A complete evaluation includes B-mode imaging, spectral Doppler, color Doppler, and power Doppler as needed of all accessible portions of each vessel. Bilateral testing is considered an integral part of a complete examination. Limited examinations for reoccurring indications may be performed as noted.  Right Findings: +----------+------------+---------+-----------+----------+-------+ RIGHT     CompressiblePhasicitySpontaneousPropertiesSummary +----------+------------+---------+-----------+----------+-------+ IJV           None       No        No               Chronic +----------+------------+---------+-----------+----------+-------+ Subclavian    Full       Yes       Yes                      +----------+------------+---------+-----------+----------+-------+ Axillary      Full       Yes       Yes                      +----------+------------+---------+-----------+----------+-------+ Brachial      Full       Yes       Yes                      +----------+------------+---------+-----------+----------+-------+ Radial        Full                                          +----------+------------+---------+-----------+----------+-------+ Ulnar         Full                                          +----------+------------+---------+-----------+----------+-------+ Cephalic      Full                                          +----------+------------+---------+-----------+----------+-------+ Basilic       Full                                           +----------+------------+---------+-----------+----------+-------+  Left Findings: +----------+------------+---------+-----------+----------+-------+ LEFT      CompressiblePhasicitySpontaneousPropertiesSummary +----------+------------+---------+-----------+----------+-------+ Subclavian    Full       Yes       Yes                      +----------+------------+---------+-----------+----------+-------+  Summary:  Right: No evidence of superficial vein thrombosis in the upper extremity. 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  Left: No evidence of thrombosis in the subclavian.  *See table(s) above for measurements and observations.  Diagnosing physician: Deitra Mayo MD Electronically signed by Deitra Mayo MD on 07/20/2021 at 5:08:16 PM.    Final         Scheduled Meds:  bisacodyl  10 mg Oral Daily   Chlorhexidine  Gluconate Cloth  6 each Topical Daily   dronabinol  2.5 mg Oral BID AC   insulin aspart  0-9 Units Subcutaneous TID WC   ketorolac  15 mg Intravenous Q8H   lactulose  20 g Oral Q6H   mouth rinse  15 mL Mouth Rinse BID   metoprolol tartrate  12.5 mg Oral BID   mirtazapine  30 mg Oral QHS   OLANZapine  10 mg Oral QHS   polyethylene glycol  17 g Oral Daily   rivaroxaban  10 mg Oral Q supper   sodium chloride flush  3 mL Intravenous Q12H   Continuous Infusions:  promethazine (PHENERGAN) injection (IM or IVPB) Stopped (07/20/21 1745)     LOS: 7 days    Georgette Shell, MD 07/22/2021, 2:00 PM

## 2021-07-22 NOTE — Progress Notes (Signed)
Daily Progress Note   Patient Name: Angela Wilson       Date: 07/22/2021 DOB: Nov 18, 1965  Age: 55 y.o. MRN#: [id number] Attending Physician: Georgette Shell, MD Primary Care Physician: Just, Laurita Quint, FNP (Inactive) Admit Date: 06/19/2021  Reason for Consultation/Follow-up: Establishing goals of care  Subjective: I saw and examined Angela Wilson this afternoon.  Multiple family members present at the bedside.  Angela Wilson reports that she continues to try and encourage Angela to eat and Angela intake remains low but she is consistently getting in some food.  Plan is for repeat paracentesis today.  Angela Wilson remains hopeful for some stabilization and that she would be a candidate for further disease modifying therapy.  Dr. Marin Olp has been very consistent in discussing with Angela that it is not likely she will ever reach a point of being able to tolerate further disease modifying therapy.    We also discussed Angela concerns about getting visitation visas for family.  I updated and provided letters outlining Angela medical condition to family earlier this week.  Length of Stay: 7  Current Medications: Scheduled Meds:   bisacodyl  10 mg Oral Daily   Chlorhexidine Gluconate Cloth  6 each Topical Daily   dronabinol  2.5 mg Oral BID AC   insulin aspart  0-9 Units Subcutaneous TID WC   ketorolac  15 mg Intravenous Q8H   mouth rinse  15 mL Mouth Rinse BID   metoprolol tartrate  12.5 mg Oral BID   mirtazapine  30 mg Oral QHS   OLANZapine  10 mg Oral QHS   polyethylene glycol  17 g Oral Daily   rivaroxaban  10 mg Oral Q supper   sodium chloride flush  3 mL Intravenous Q12H    Continuous Infusions:  promethazine (PHENERGAN) injection (IM or IVPB) Stopped (07/20/21 1745)    PRN  Meds: acetaminophen **OR** acetaminophen, glycopyrrolate, HYDROmorphone (DILAUDID) injection, HYDROmorphone, lip balm, LORazepam, ondansetron **OR** ondansetron (ZOFRAN) IV, prochlorperazine, promethazine (PHENERGAN) injection (IM or IVPB), senna-docusate, sodium chloride flush  Physical Exam         Appears chronically ill fatigued Regular work of breathing Has O2 [place] Abdomen distended Has edema  Vital Signs: BP (!) 141/78 (BP Location: Right Arm)   Pulse (!) 122   Temp 99 F (37.2 C) (Axillary)  Resp (!) 24   Ht 5\' 6"  (1.676 m)   Wt 94.8 kg   SpO2 93%   BMI 33.73 kg/m  SpO2: SpO2: 93 % O2 Device: O2 Device: Room Air O2 Flow Rate: O2 Flow Rate (L/min): 0 L/min  Intake/output summary:  Intake/Output Summary (Last 24 hours) at 07/22/2021 2376 Last data filed at 07/22/2021 0600 Gross per 24 hour  Intake 300 ml  Output 600 ml  Net -300 ml    LBM: Last BM Date: 07/21/21 Baseline Weight: Weight: 100.7 kg Most recent weight: Weight: 94.8 kg PPS 30%      Palliative Assessment/Data:      Patient Active Problem List   Diagnosis Date Noted   Intravascular volume depletion 07/18/2021   Internal jugular (IJ) vein thromboembolism, chronic, right ([place]) 07/16/2021   Goals of care, counseling/discussion 07/16/2021   Acute respiratory failure with hypoxia ([place]) 07/14/2021   Abdominal ascites 07/18/2021   Pleural effusion on right 06/25/2021   Hyponatremia 07/14/2021   Genetic testing 05/12/2020   Family history of breast cancer    Family history of ovarian cancer    Family history of kidney cancer    Family history of brain cancer    Essential hypertension, benign 04/06/2020   Pancreatic cancer ([place]) 02/14/2020   Cancer associated pain 02/13/2020   Stage IV adenocarcinoma of pancreas ([place]) 02/03/2020   Screening for colon cancer 01/07/2020   Encounter for screening mammogram for malignant neoplasm of breast 01/07/2020   Need for Tdap vaccination 01/07/2020   Pancreatic  mass 12/30/2019   Nephrolithiasis 12/20/2012   GERD (gastroesophageal reflux disease) 12/20/2012   Type 2 diabetes mellitus ([place]) 11/09/2012    Palliative Care Assessment & Plan   Patient Profile:    Assessment:  55 yo lady with life limiting illness of pancreatic cancer, ascites, abdominal pain, LE edema.   Recommendations/Plan: Continue current care.  While she is not likely to ever reach a point of being able to have further disease modifying therapy for Angela underlying cancer, family is clear in desire for continuation of aggressive interventions.  She remains full code/full scope. I completed documentation outlining Angela medical illness to support Angela son and Wilson's applications for visitation visa earlier this week.   Goals of Care and Additional Recommendations: Limitations on Scope of Treatment: Full Scope Treatment  Code Status:    Code Status Orders  (From admission, onward)           Start     Ordered   06/27/2021 2022  Full code  Continuous        07/11/2021 2024           Code Status History     Date Active Date Inactive Code Status Order ID Comments User Context   02/14/2020 0213 02/18/2020 2347 Full Code [id number]  Clance Boll, MD Inpatient       Prognosis: Likely weeks at best, she is at high risk for acute decompensation.  Discharge Planning: To Be Determined although there is a real possibility this will be a terminal admission.  Care plan was discussed with IDT.    Thank you for allowing the Palliative Medicine Team to assist in the care of this patient.   Total Time 30 Prolonged Time Billed  no   Greater than 50%  of this time was spent counseling and coordinating care related to the above assessment and plan.  Micheline Rough, MD  Please contact Palliative Medicine Team phone at [phone] for questions and concerns.

## 2021-07-23 ENCOUNTER — Other Ambulatory Visit: Payer: Self-pay

## 2021-07-23 ENCOUNTER — Inpatient Hospital Stay (HOSPITAL_COMMUNITY): Payer: 59

## 2021-07-23 DIAGNOSIS — R9431 Abnormal electrocardiogram [ECG] [EKG]: Secondary | ICD-10-CM

## 2021-07-23 DIAGNOSIS — J9601 Acute respiratory failure with hypoxia: Secondary | ICD-10-CM | POA: Diagnosis not present

## 2021-07-23 LAB — COMPREHENSIVE METABOLIC PANEL
ALT: 29 U/L (ref 0–44)
AST: 24 U/L (ref 15–41)
Albumin: 2 g/dL — ABNORMAL LOW (ref 3.5–5.0)
Alkaline Phosphatase: 57 U/L (ref 38–126)
Anion gap: 10 (ref 5–15)
BUN: 31 mg/dL — ABNORMAL HIGH (ref 6–20)
CO2: 25 mmol/L (ref 22–32)
Calcium: 7.9 mg/dL — ABNORMAL LOW (ref 8.9–10.3)
Chloride: 98 mmol/L (ref 98–111)
Creatinine, Ser: 0.46 mg/dL (ref 0.44–1.00)
GFR, Estimated: >60 mL/min (ref >60)
Glucose, Bld: 246 mg/dL — ABNORMAL HIGH (ref 70–99)
Potassium: 3.9 mmol/L (ref 3.5–5.1)
Sodium: 133 mmol/L — ABNORMAL LOW (ref 135–145)
Total Bilirubin: 0.7 mg/dL (ref 0.3–1.2)
Total Protein: 5.6 g/dL — ABNORMAL LOW (ref 6.5–8.1)

## 2021-07-23 LAB — CBC WITH DIFFERENTIAL/PLATELET
Abs Immature Granulocytes: 0.02 10*3/uL (ref 0.00–0.07)
Basophils Absolute: 0 10*3/uL (ref 0.0–0.1)
Basophils Relative: 0 %
Eosinophils Absolute: 0.1 10*3/uL (ref 0.0–0.5)
Eosinophils Relative: 3 %
HCT: 34.7 % — ABNORMAL LOW (ref 36.0–46.0)
Hemoglobin: 10.7 g/dL — ABNORMAL LOW (ref 12.0–15.0)
Immature Granulocytes: 1 %
Lymphocytes Relative: 25 %
Lymphs Abs: 0.8 10*3/uL (ref 0.7–4.0)
MCH: 26.6 pg (ref 26.0–34.0)
MCHC: 30.8 g/dL (ref 30.0–36.0)
MCV: 86.1 fL (ref 80.0–100.0)
Monocytes Absolute: 0.2 10*3/uL (ref 0.1–1.0)
Monocytes Relative: 7 %
Neutro Abs: 2.1 10*3/uL (ref 1.7–7.7)
Neutrophils Relative %: 64 %
Platelets: 252 10*3/uL (ref 150–400)
RBC: 4.03 MIL/uL (ref 3.87–5.11)
RDW: 17.9 % — ABNORMAL HIGH (ref 11.5–15.5)
WBC: 3.2 10*3/uL — ABNORMAL LOW (ref 4.0–10.5)
nRBC: 0 % (ref 0.0–0.2)

## 2021-07-23 LAB — ECHOCARDIOGRAM LIMITED
Height: 66 in
Weight: 3375.68 oz

## 2021-07-23 LAB — GLUCOSE, CAPILLARY
Glucose-Capillary: 249 mg/dL — ABNORMAL HIGH (ref 70–99)
Glucose-Capillary: 262 mg/dL — ABNORMAL HIGH (ref 70–99)
Glucose-Capillary: 192 mg/dL — ABNORMAL HIGH (ref 70–99)

## 2021-07-23 LAB — PREALBUMIN: Prealbumin: 9 mg/dL — ABNORMAL LOW (ref 18–38)

## 2021-07-23 MED ORDER — LACTULOSE 10 GM/15ML PO SOLN
20.0000 g | Freq: Every day | ORAL | Status: DC
  Administered 2021-07-23: 20 g via ORAL
  Filled 2021-07-23: qty 30

## 2021-07-23 MED ORDER — ENSURE ENLIVE PO LIQD: 237.0000 mL | Freq: Two times a day (BID) | ORAL | Status: DC

## 2021-07-23 NOTE — Progress Notes (Signed)
Initial Nutrition Assessment  DOCUMENTATION CODES:   Obesity unspecified  INTERVENTION:  - continue Ensure Enlive BID, each supplement provides 350 kcal and 20 grams of protein. - will order 1 tablet multivitamin with minerals/day. - complete NFPE when feasible.    NUTRITION DIAGNOSIS:   Increased nutrient needs related to cancer and cancer related treatments, catabolic illness, chronic illness as evidenced by estimated needs.  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 She presented to the ED with abdominal pain and N/V. Patient was diagnosed with pancreatic cancer in 01/2020, followed by Dr. Marin Olp, and has undergone extensive treatment. Patient with decreased appetite, generalized edema, and recurrent ascites.  Tech providing patient care at the time of RD visit. Able to talk with patient's daughter outside of patient's room. She shares that family has been bringing in foods that patient enjoys that they have prepared or that has been given to them by friends for the patient. Confirmed with daughter that patient can have anything that is appealing to her, no restrictions in place. Discussed rationale for elevated CBGs and that CBGs are not a concern in limiting what patient is allowed to eat and drink.   Daughter appreciative and does not have any additional nutrition-related questions or concerns at this time.   She has mainly ben consuming 0-15% at meals since 10/30. Noted to have eaten 100% of breakfast this AM.   Ensure was ordered BID starting this AM and patient accepted the bottle offered to her this AM.   Marinol was started on  10/28.   Weight today is 211 lb and weight on 10/30 was 210 lb. Weight fairly stable since 03/26/21-06/30/21 (201-208 lb). Mild pitting edema to BLE documented in the edema section of flow sheet.   Per notes: - patient is not a candidate for further chemo treatment - large R pleural effusion; thoracentesis not done as patient does not show distress  - ascites--attempted paracentesis with minimal amount of fluid aspirated  - poor prognosis to remain Full Code    Labs reviewed; CBGs: 249 and 262 mg/dl, Na: 133 mmol/l, BUN: 31 mg/dl, Ca: 7.9 mg/dl.   Medications reviewed; 2.5 mg oral marinol BID, sliding scale novolog, 20 g lactulose/day, 17 g miralax/day.    NUTRITION - FOCUSED PHYSICAL EXAM:  Unable to complete at this time.   Diet Order:   Diet Order             Diet regular Room service appropriate? Yes; Fluid consistency: Thin  Diet effective now                   EDUCATION NEEDS:   Education needs have been addressed  Skin:  Skin Assessment: Reviewed RN Assessment  Last BM:  11/4 (type 6 x1 and type 7 x1)  Height:   Ht Readings from Last 1 Encounters:  07/06/2021 5\' 6"  (1.676 m)    Weight:   Wt Readings from Last 1 Encounters:  07/23/21 95.7 kg     Estimated Nutritional Needs:  Kcal:  2100-2300 kcal Protein:  110-125 grams Fluid:  >/= 1.6 L/day      Angela Matin, MS, RD, LDN, CNSC  Inpatient Clinical Dietitian RD pager # available in [place]  After hours/weekend pager # available in [facility]

## 2021-07-23 NOTE — Progress Notes (Addendum)
PROGRESS NOTE    Angela Wilson  ZOX:[id number] DOB: February 20, 1966 DOA: 07/09/2021 PCP: Just, Laurita Quint, FNP (Inactive)   Brief Narrative: 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 She has undergone extensive treatment since initial diagnosis in May 2021.  She is followed outpatient by Dr. Marin Olp. Despite treatment, her cancer seems to have progressed and lately she does not appear to be tolerating treatment from a physical ability standpoint.  Her appetite has decreased and she has developed generalized edema with recurrent abdominal ascites and now with enlarging right pleural effusion seen on imaging.  Due to religious beliefs, patient and family continue to want full scope of care without consideration of scaling back treatment in efforts to focus on comfort.  Assessment & Plan:   Principal Problem:   Acute respiratory failure with hypoxia (HCC) Active Problems:   Type 2 diabetes mellitus (HCC)   Stage IV adenocarcinoma of pancreas (HCC)   Essential hypertension, benign   Abdominal ascites   Pleural effusion on right   Hyponatremia   Internal jugular (IJ) vein thromboembolism, chronic, right (HCC)   Goals of care, counseling/discussion   Intravascular volume depletion  #1 stage IV adenocarcinoma of the pancreas diagnosed in May 2021 with mets to the peritoneum -patient has had multiple rounds of chemotherapy.  She is not a candidate for any chemotherapy at this time.  She was seen by Dr. Marin Olp.  Palliative care was consulted however due to religious believes patient's daughter believes that she is going to be healed.  We will continue discussions with the patient's daughter.  #2 acute hypoxic respiratory failure due to large right pleural effusion patient appears comfortable without any respiratory  distress at this time.  Thoracentesis was not done due to this reason. She is 93% on room air. Chest x-ray 07/24/2021 with bilateral small to moderate pleural effusion.  May need thoracentesis in the near future.  #3 ascites patient was taken down to IR for possible paracentesis however she had very minimal amount of ascites fluid which was in close proximity to bowels so this was not attempted.  #4 goals of care patient remains a full code with very poor prognosis.  #5 tachycardia-multifactorial I have started her on low-dose Lopressor.  Blood pressure stable.  Echo with normal ejection fraction.  #6 right IJ thrombus on Xarelto  #7  Fever temperature curve coming down no evidence of infection noted so far.  This is likely tumor fever.  Continue Toradol.  #8 colonic ileus-I will make her n.p.o. place her on normal saline at 75 cc an hour.  Try to minimize narcotics. I have stopped most of her medications that can cause her sleepy/drowsy.  I stopped the Zyprexa Remeron I have DC'd her Marinol as she was not taking it p.o. and has having nausea and vomiting I have changed her Ativan to IV hoping this will help her nausea and vomiting She is also getting Zofran, Compazine, Emend  Follow-up KUB in a.m. Unfortunately she is not able to ambulate or move much due to generalized deconditioning weakness and limitation due to tachycardia Estimated body mass index is 34.05 kg/m as calculated from the following:   Height as of this encounter: 5\' 6"  (1.676 m).   Weight as of this encounter: 95.7 kg.  DVT prophylaxis: Xarelto  code Status: Full code Family Communication: Discussed with daughter  at bedside disposition Plan:  Status is: Inpatient  Remains inpatient appropriate because: Tachycardic with stage IV pancreatic CA with mets  Consultants:  Oncology  Procedures: Paracentesis Antimicrobials: None  Subjective: Daughter by the bedside patient had a bad night due to diarrhea nausea and  vomiting. Stat KUB shows colonic ileus Chest x-ray shows small to moderate bilateral pleural effusion Albumin 2.0  Objective: Vitals:   07/23/21 0000 07/23/21 0400 07/23/21 0447 07/23/21 0748  BP: 132/86 129/74  127/83  Pulse: (!) 129 (!) 133  (!) 135  Resp: (!) 28 (!) 32  (!) 34  Temp:   (!) 100.5 F (38.1 C) 99.1 F (37.3 C)  TempSrc:   Axillary   SpO2: 96% 95%  93%  Weight:   95.7 kg   Height:        Intake/Output Summary (Last 24 hours) at 07/23/2021 1109 Last data filed at 07/23/2021 1050 Gross per 24 hour  Intake 240 ml  Output 20 ml  Net 220 ml    Filed Weights   07/22/21 0500 07/22/21 0600 07/23/21 0447  Weight: 97.2 kg 94.8 kg 95.7 kg    Examination: Tachycardic  General exam: Appears uncomfortable and ill appearing Respiratory system: Diminished the bases to auscultation. Respiratory effort normal. Cardiovascular system: S1 & S2 heard, RRR. No JVD, murmurs, rubs, gallops or clicks.  Gastrointestinal system: Abdomen is distended, nontender. No organomegaly or masses felt. No bowel sounds heard. Central nervous system: Alert and oriented. No focal neurological deficits. Extremities: Symmetric 5 x 5 power. Skin: No rashes, lesions or ulcers Psychiatry: Unable to assess  Data Reviewed: I have personally reviewed following labs and imaging studies  CBC: Recent Labs  Lab 07/18/21 0353 07/19/21 0408 07/20/21 0334 07/21/21 0514 07/23/21 0345  WBC 3.8* 3.8* 3.5* 4.0 3.2*  NEUTROABS 2.7 2.7 2.3 2.7 2.1  HGB 11.0* 10.2* 10.2* 10.2* 10.7*  HCT 35.4* 33.1* 33.4* 34.3* 34.7*  MCV 85.7 86.0 88.4 87.5 86.1  PLT 155 145* 144* 182 852    Basic Metabolic Panel: Recent Labs  Lab 07/17/21 0523 07/18/21 0353 07/19/21 0408 07/20/21 0334 07/21/21 0514 07/23/21 0345  NA 130* 132* 131* 133* 132* 133*  K 4.0 4.1 3.8 3.8 3.8 3.9  CL 97* 95* 97* 98 98 98  CO2 28 28 27 29 29 25   GLUCOSE 225* 232* 239* 241* 261* 246*  BUN 14 13 14 14 17  31*  CREATININE 0.49 0.35*  0.46 0.37* 0.41* 0.46  CALCIUM 7.8* 7.9* 7.5* 7.7* 7.5* 7.9*  MG 2.0 2.0 1.8 1.9 1.9  --     GFR: Estimated Creatinine Clearance: 94.9 mL/min (by C-G formula based on SCr of 0.46 mg/dL). Liver Function Tests: Recent Labs  Lab 07/18/21 0353 07/19/21 0408 07/20/21 0334 07/21/21 0514 07/23/21 0345  AST 16 18 27 27 24   ALT 14 16 22 27 29   ALKPHOS 53 43 45 47 57  BILITOT 0.8 0.7 0.8 0.8 0.7  PROT 5.8* 5.3* 5.4* 5.4* 5.6*  ALBUMIN 2.1* 1.8* 2.0* 2.0* 2.0*    No results for input(s): LIPASE, AMYLASE in the last 168 hours. No results for input(s): AMMONIA in the last 168 hours. Coagulation Profile: No results for input(s): INR, PROTIME in the last 168 hours.  Cardiac Enzymes: No results for input(s): CKTOTAL, CKMB, CKMBINDEX, TROPONINI in the last 168 hours. BNP (last 3 results) No results for input(s): PROBNP in the last 8760 hours. HbA1C: No results for input(s): HGBA1C in the last 72 hours. CBG: Recent Labs  Lab 07/22/21  9563 07/22/21 1210 07/22/21 1737 07/22/21 2045 07/23/21 0750  GLUCAP 214* 205* 191* 182* 249*    Lipid Profile: No results for input(s): CHOL, HDL, LDLCALC, TRIG, CHOLHDL, LDLDIRECT in the last 72 hours. Thyroid Function Tests: No results for input(s): TSH, T4TOTAL, FREET4, T3FREE, THYROIDAB in the last 72 hours. Anemia Panel: No results for input(s): VITAMINB12, FOLATE, FERRITIN, TIBC, IRON, RETICCTPCT in the last 72 hours. Sepsis Labs: No results for input(s): PROCALCITON, LATICACIDVEN in the last 168 hours.   Recent Results (from the past 240 hour(s))  Culture, blood (Routine x 2)     Status: None   Collection Time: 07/07/2021  1:45 PM   Specimen: Chest; Blood  Result Value Ref Range Status   Specimen Description   Final    CHEST RIGHT PORTA CATH Performed at [facility], [address] [place], [place] [address]    Special Requests   Final    BOTTLES DRAWN AEROBIC AND ANAEROBIC Blood Culture adequate volume Performed at [facility], [address] [place], [place] [address]    Culture   Final    NO GROWTH 5 DAYS Performed at [facility], [place] [address], [place], [place] [address]    Report Status 07/20/2021 FINAL  Final  Culture, blood (Routine x 2)     Status: None   Collection Time: 07/04/2021  2:00 PM   Specimen: BLOOD RIGHT HAND  Result Value Ref Range Status   Specimen Description   Final    BLOOD RIGHT HAND Performed at [facility], [address] [place], [place] [address]    Special Requests   Final    BOTTLES DRAWN AEROBIC AND ANAEROBIC Blood Culture adequate volume Performed at [facility], [address] [place], [place] [address]    Culture   Final    NO GROWTH 5 DAYS Performed at [facility], [place] [address], [place], [place] [address]    Report Status 07/20/2021 FINAL  Final  Resp Panel by RT-PCR (Flu A&B, Covid) Nasopharyngeal Swab     Status: None   Collection Time: 06/27/2021  3:44 PM   Specimen: Nasopharyngeal Swab; Nasopharyngeal(NP) swabs in vial transport medium  Result Value Ref Range Status   SARS Coronavirus 2 by RT PCR NEGATIVE NEGATIVE Final    Comment: (NOTE) SARS-CoV-2 target nucleic acids are NOT DETECTED.  The SARS-CoV-2 RNA is generally detectable in upper respiratory specimens during the acute phase of infection. The lowest concentration of SARS-CoV-2 viral copies this assay can detect is 138 copies/mL. A negative result does not preclude SARS-Cov-2 infection and should not be used as the sole basis for treatment or other patient management decisions. A negative result may occur with  improper specimen collection/handling, submission of specimen other than nasopharyngeal swab, presence of viral mutation(s) within the areas targeted by this assay, and inadequate number of viral copies(<138 copies/mL). A negative result must be combined with clinical observations, patient history, and epidemiological information. The  expected result is Negative.  Fact Sheet for Patients:  EntrepreneurPulse.com.au  Fact Sheet for Healthcare Providers:  IncredibleEmployment.be  This test is no t yet approved or cleared by the [place] FDA and  has been authorized for detection and/or diagnosis of SARS-CoV-2 by FDA under an Emergency Use Authorization (EUA). This EUA will remain  in effect (meaning this test can be used) for the duration of the COVID-19 declaration under Section 564(b)(1) of the Act,  21 U.S.C.section 360bbb-3(b)(1), unless the authorization is terminated  or revoked sooner.       Influenza A by PCR NEGATIVE NEGATIVE Final   Influenza B by PCR NEGATIVE NEGATIVE Final    Comment: (NOTE) The Xpert Xpress SARS-CoV-2/FLU/RSV plus assay is intended as an aid in the diagnosis of influenza from Nasopharyngeal swab specimens and should not be used as a sole basis for treatment. Nasal washings and aspirates are unacceptable for Xpert Xpress SARS-CoV-2/FLU/RSV testing.  Fact Sheet for Patients: EntrepreneurPulse.com.au  Fact Sheet for Healthcare Providers: IncredibleEmployment.be  This test is not yet approved or cleared by the [place] FDA and has been authorized for detection and/or diagnosis of SARS-CoV-2 by FDA under an Emergency Use Authorization (EUA). This EUA will remain in effect (meaning this test can be used) for the duration of the COVID-19 declaration under Section 564(b)(1) of the Act, 21 U.S.C. section 360bbb-3(b)(1), unless the authorization is terminated or revoked.  Performed at [facility], [address] [place], [place] [address]   Urine Culture     Status: Abnormal   Collection Time: 07/20/21 12:29 PM   Specimen: Urine, Catheterized  Result Value Ref Range Status   Specimen Description   Final    URINE, CATHETERIZED Performed at [facility] [address], [place], [place] [address]    Special Requests   Final    Immunocompromised Performed at [facility], [place] [address] [place], [place] [address]    Culture (A)  Final    <10,000 COLONIES/mL INSIGNIFICANT GROWTH Performed at [facility] [address], [place], [place] [address]    Report Status 07/21/2021 FINAL  Final          Radiology Studies: DG Cervical Spine 2 or 3 views  Result Date: 07/21/2021 CLINICAL DATA:  Pancreatic cancer with metastases. EXAM: CERVICAL SPINE - 2-3 VIEW COMPARISON:  None. FINDINGS: Lateral view includes skull base through mid C6. There is straightening of normal cervical lordosis. There is no evidence for subluxation. Disc spaces are maintained. There is no acute fracture. No focal osseous lesions are identified. There is no prevertebral soft tissue swelling. Catheter is partially visualized overlying the right chest. IMPRESSION: Negative cervical spine radiographs. Electronically Signed   By: Ronney Asters M.D.   On: 07/21/2021 16:18   DG Abd 1 View  Result Date: 07/21/2021 CLINICAL DATA:  Distended abdomen. EXAM: ABDOMEN - 1 VIEW COMPARISON:  None. FINDINGS: The bowel gas pattern is normal. There is a calcification in the left hemipelvis, likely a phlebolith. No acute fractures are seen. IMPRESSION: Negative. Electronically Signed   By: Ronney Asters M.D.   On: 07/21/2021 16:58   [place] ASCITES (ABDOMEN LIMITED)  Result Date: 07/21/2021 CLINICAL DATA:  55 year old female with history of pancreatic cancer and ascites. EXAM: LIMITED ABDOMEN ULTRASOUND FOR ASCITES TECHNIQUE: Limited ultrasound survey for ascites was performed in all four abdominal quadrants. COMPARISON:  07/16/2021 FINDINGS: Trace ascites in the right upper, left upper, and right lower quadrants. No significant ascites in the left lower quadrant. IMPRESSION: Trace ascites.  No safe window with for paracentesis at this time. Ruthann Cancer, MD [facility] Electronically Signed   By: Ruthann Cancer M.D.   On: 07/21/2021 16:10        Scheduled Meds:  bisacodyl  10 mg Oral Daily   Chlorhexidine Gluconate Cloth  6 each Topical Daily   dronabinol  2.5 mg Oral BID  AC   feeding supplement  237 mL Oral BID BM   insulin aspart  0-9 Units Subcutaneous TID WC   metoprolol tartrate  12.5 mg Oral BID   mirtazapine  30 mg Oral QHS   OLANZapine  10 mg Oral QHS   polyethylene glycol  17 g Oral Daily   rivaroxaban  10 mg Oral Q supper   sodium chloride flush  3 mL Intravenous Q12H   Continuous Infusions:  promethazine (PHENERGAN) injection (IM or IVPB) Stopped (07/20/21 1745)     LOS: 8 days    Georgette Shell, MD 07/23/2021, 11:09 AM

## 2021-07-23 NOTE — Progress Notes (Signed)
Overall, there really has not been much change with Angela Wilson.  She is sleeping most of the time.  I really do not think she is eating much.  She is having some water.  It looks like she try to get out of bed yesterday.  We took everybody to really help her.  I just have my doubts that she will ever have treatment again.  I told her daughter that we would certainly consider treatment if she can get stronger.  I just do not know if she could get stronger.  She is still tachycardic.  I think this is all reactionary.  Her blood pressure is doing fine.  I told her daughter that I would not recommend any medicine to decrease her heart rate as this would decrease her blood pressure.  She is not hurting for I can tell.  There is no nausea or vomiting.  Again, she really has not eating.  The urine culture is negative..  She is still having some temperature spikes.  I think this is all malignancy related.  Her labs show white cell count 3.2.  Hemoglobin 10.7.  Platelet count 252,000.  Her prealbumin is 9.  Her BUN is 31 creatinine 0.46.  Her glucose is 246 but I am not too worried about this.  She has had no obvious shortness of breath.  Her vital signs show temperature of 100.5.  Pulse is 133.  Blood pressure 129/74.  Her cardiac exam is tachycardic but regular.  Lungs sound pretty clear bilaterally.  Abdomen is soft.  She is somewhat distended.  Bowel sounds are decreased.  Again, I am not sure what our "end game" will be here.  I talked to her daughter about end-of-life issues.  Because of religious believes, everything has to be done for her mom.  I respect this.  I told her daughter that if Angela Wilson does go on life support, she would never come off it and at some point it would be up to her to take her off.  I think she does understand this.  I do realize that there are religious undertones with her care.  I again have to respect this.  I know the staff is doing a great job with [name].  I know this can be challenging.  I do appreciate all their hard work and the compassion that they show.  Lattie Haw, MD John 1:17

## 2021-07-23 NOTE — Evaluation (Signed)
Physical Therapy Evaluation Patient Details Name: Angela Wilson MRN: [id number] DOB: [birth year] Today's Date: 07/23/2021  History of Present Illness  Pt is a 55 y.o. female who presented with abdominal pain and nausea/vomiting. 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 Her appetite has decreased and she has developed generalized edema with recurrent abdominal ascites and now with enlarging right pleural effusion seen on imaging.   Clinical Impression  Pt requiring MAX A+2 for supine to/from sit transfers, limited with further mobility today due to abdominal discomfort. Hopeful that pt may perform better/ demonstrate improved activity tolerance in furture sessions when having less discomfort. Recommend SNF at this time due to current mobility status based on today's evaluation. However, if able to get pt closer to MIN A level, pt has good support system at home from family who can provide 24/7 supervision/assist. Pending pt's progress and if d/c home vs. SNF may need to further educate pt and family on how to enter home at w/c level, pt has 3 stairs to enter home. Pt will benefit from continued skilled PT to increase their independence and maximize safety with mobility.      Recommendations for follow up therapy are one component of a multi-disciplinary discharge planning process, led by the attending physician.  Recommendations may be updated based on patient status, additional functional criteria and insurance authorization.  Follow Up Recommendations Skilled nursing-short term rehab (<3 hours/day)    Assistance Recommended at Discharge Frequent or constant Supervision/Assistance (with mobility)  Functional Status Assessment Patient has had a recent decline in their functional status and/or demonstrates limited ability to make significant improvements in function in a reasonable and predictable  amount of time  Equipment Recommendations  Rolling walker (2 wheels);Wheelchair (measurements PT) (w/c due to pt's prognosis)    Recommendations for Other Services       Precautions / Restrictions Precautions Precautions: Fall Restrictions Weight Bearing Restrictions: No      Mobility  Bed Mobility Overal bed mobility: Needs Assistance Bed Mobility: Supine to Sit;Sit to Supine     Supine to sit: Max assist;+2 for physical assistance;HOB elevated Sit to supine: Max assist;+2 for physical assistance;HOB elevated   General bed mobility comments: Cues for sequencing and proper hand placement to perform. Pt able to initiate LEs to EOB with increased time, requesting assist with R LE from therapist to complete. MOD A to maintain upright seated balance, pt with posterior and slight Lt lateral lean. cues for use of UEs to assist with independence in sititng.  Pt limited with further mobility due to request to return to supine due to abdominal discomfort.    Transfers                        Ambulation/Gait                Stairs            Wheelchair Mobility    Modified Rankin (Stroke Patients Only)       Balance Overall balance assessment: Needs assistance Sitting-balance support: Feet supported;Bilateral upper extremity supported Sitting balance-Leahy Scale: Poor                                       Pertinent Vitals/Pain Pain Assessment: Faces Faces Pain Scale: Hurts little more Pain Location: abdomen Pain Descriptors /  Indicators: Aching;Discomfort Pain Intervention(s): Limited activity within patient's tolerance;Monitored during session;Repositioned    Home Living Family/patient expects to be discharged to:: Private residence Living Arrangements: Children;Other relatives Available Help at Discharge: Family;Available 24 hours/day Type of Home: House Home Access: Stairs to enter Entrance Stairs-Rails: None Entrance  Stairs-Number of Steps: 3 Alternate Level Stairs-Number of Steps: 14 Home Layout: Two level;Able to live on main level with bedroom/bathroom Home Equipment: None Additional Comments: Pt was mobile, very quick decline in past ~5 days . denies falls at home.    Prior Function Prior Level of Function : Independent/Modified Independent             Mobility Comments: pt does not do any driving/going out shopping but independent around the home.       Hand Dominance        Extremity/Trunk Assessment   Upper Extremity Assessment Upper Extremity Assessment: Overall WFL for tasks assessed    Lower Extremity Assessment Lower Extremity Assessment: Generalized weakness    Cervical / Trunk Assessment Cervical / Trunk Assessment: Normal  Communication   Communication: Prefers language other than [place] (daughter present and providing translation throughout session)  Cognition Arousal/Alertness: Awake/alert Behavior During Therapy: WFL for tasks assessed/performed Overall Cognitive Status: Within Functional Limits for tasks assessed                                          General Comments General comments (skin integrity, edema, etc.): Pt with HR MAX of 127bpm and RR of up to 40 with mobility during session    Exercises     Assessment/Plan    PT Assessment Patient needs continued PT services  PT Problem List Decreased strength;Decreased activity tolerance;Decreased balance;Decreased mobility       PT Treatment Interventions DME instruction;Gait training;Stair training;Functional mobility training;Therapeutic activities;Therapeutic exercise;Balance training;Patient/family education    PT Goals (Current goals can be found in the Care Plan section)  Acute Rehab PT Goals Patient Stated Goal: Continue working with therapy PT Goal Formulation: With patient/family Time For Goal Achievement: 08/06/21 Potential to Achieve Goals: Fair    Frequency Min  2X/week   Barriers to discharge        Co-evaluation               AM-PAC PT "6 Clicks" Mobility  Outcome Measure Help needed turning from your back to your side while in a flat bed without using bedrails?: Total Help needed moving from lying on your back to sitting on the side of a flat bed without using bedrails?: Total Help needed moving to and from a bed to a chair (including a wheelchair)?: Total Help needed standing up from a chair using your arms (e.g., wheelchair or bedside chair)?: Total Help needed to walk in hospital room?: Total Help needed climbing 3-5 steps with a railing? : Total 6 Click Score: 6    End of Session   Activity Tolerance: Patient limited by pain (abdominal discomfort) Patient left: in bed;with call bell/phone within reach;with bed alarm set;with family/visitor present Nurse Communication: Mobility status PT Visit Diagnosis: Other abnormalities of gait and mobility (R26.89);Difficulty in walking, not elsewhere classified (R26.2)    Time: 0962-8366 PT Time Calculation (min) (ACUTE ONLY): 18 min   Charges:   PT Evaluation $PT Eval Moderate Complexity: 1 Mod         Festus Barren PT, DPT  Acute Rehabilitation Services  Office [phone]   07/23/2021, 2:52 PM

## 2021-07-23 NOTE — TOC Progression Note (Addendum)
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 She has undergone extensive treatment since initial diagnosis in May 2021.  She is followed outpatient by Dr. Marin Olp. Despite treatment, her cancer seems to have progressed and lately she does not appear to be tolerating treatment from a physical ability standpoint.  Her appetite has decreased and she has developed generalized edema with recurrent abdominal ascites and now with enlarging right pleural effusion seen on imaging.  Due to religious beliefs, patient and family continue to want full scope of care without consideration of scaling back treatment in efforts to focus on comfort.  toc plan of care: following for toc needs and progression. Patient information for snf placement faxed to all area snf's. At 1237.  Expected Discharge Plan: Home/Self Care Barriers to Discharge: Continued Medical Work up  Expected Discharge Plan and Services Expected Discharge Plan: Home/Self Care   Discharge Planning Services: CM Consult   Living arrangements for the past 2 months: Single Family Home                                       Social Determinants of Health (SDOH) Interventions    Readmission Risk Interventions No flowsheet data found.

## 2021-07-23 NOTE — NC FL2 (Signed)
[place] LEVEL OF CARE SCREENING TOOL     IDENTIFICATION  Patient Name: Angela Wilson Birthdate: 09/17/1966 Sex: female Admission Date (Current Location): 07/14/2021  [facility] and [place] Number:  Herbalist and Address:  [facility],  [address] [place], [place]      Provider Number: [id number]  Attending Physician Name and Address:  Georgette Shell, MD  Relative Name and Phone Number:       Current Level of Care: Hospital Recommended Level of Care: [place] Prior Approval Number:    Date Approved/Denied:   PASRR Number: [id number]  Discharge Plan: SNF    Current Diagnoses: Patient Active Problem List   Diagnosis Date Noted   Intravascular volume depletion 07/18/2021   Internal jugular (IJ) vein thromboembolism, chronic, right ([place]) 07/16/2021   Goals of care, counseling/discussion 07/16/2021   Acute respiratory failure with hypoxia ([place]) 06/25/2021   Abdominal ascites 07/05/2021   Pleural effusion on right 06/30/2021   Hyponatremia 07/13/2021   Genetic testing 05/12/2020   Family history of breast cancer    Family history of ovarian cancer    Family history of kidney cancer    Family history of brain cancer    Essential hypertension, benign 04/06/2020   Pancreatic cancer ([place]) 02/14/2020   Cancer associated pain 02/13/2020   Stage IV adenocarcinoma of pancreas ([place]) 02/03/2020   Screening for colon cancer 01/07/2020   Encounter for screening mammogram for malignant neoplasm of breast 01/07/2020   Need for Tdap vaccination 01/07/2020   Pancreatic mass 12/30/2019   Nephrolithiasis 12/20/2012   GERD (gastroesophageal reflux disease) 12/20/2012   Type 2 diabetes mellitus ([place]) 11/09/2012    Orientation RESPIRATION BLADDER Height & Weight     Self, Time, Situation, Place  Normal Continent Weight: 95.7 kg Height:  5\' 6"  (167.6 cm)  BEHAVIORAL SYMPTOMS/MOOD NEUROLOGICAL BOWEL NUTRITION  STATUS      Continent Diet (regular)  AMBULATORY STATUS COMMUNICATION OF NEEDS Skin   Extensive Assist Verbally Normal                       Personal Care Assistance Level of Assistance  Bathing, Dressing, Feeding Bathing Assistance: Limited assistance Feeding assistance: Limited assistance Dressing Assistance: Limited assistance     Functional Limitations Info  Sight, Hearing, Speech Sight Info: Adequate Hearing Info: Adequate Speech Info: Adequate    SPECIAL CARE FACTORS FREQUENCY  PT (By licensed PT), OT (By licensed OT)     PT Frequency: 5 x weekly OT Frequency: 5 x weekly            Contractures Contractures Info: Not present    Additional Factors Info  Code Status Code Status Info: full             Current Medications (07/23/2021):  This is the current hospital active medication list Current Facility-Administered Medications  Medication Dose Route Frequency Provider Last Rate Last Admin   acetaminophen (TYLENOL) tablet 1,000 mg  1,000 mg Oral Q6H PRN Zada Finders R, MD   1,000 mg at 07/23/21 7412   Or   acetaminophen (TYLENOL) suppository 650 mg  650 mg Rectal Q6H PRN Lenore Cordia, MD       bisacodyl (DULCOLAX) EC tablet 10 mg  10 mg Oral Daily Georgette Shell, MD   10 mg at 07/23/21 0849   Chlorhexidine Gluconate Cloth 2 % PADS 6 each  6 each Topical Daily Lenore Cordia, MD   6  each at 07/23/21 0849   dronabinol (MARINOL) capsule 2.5 mg  2.5 mg Oral BID AC Zada Finders R, MD   2.5 mg at 07/22/21 1807   feeding supplement (ENSURE ENLIVE / ENSURE PLUS) liquid 237 mL  237 mL Oral BID BM Georgette Shell, MD       glycopyrrolate (ROBINUL) injection 0.2 mg  0.2 mg Intravenous Q6H PRN Loistine Chance, MD       HYDROmorphone (DILAUDID) injection 0.5 mg  0.5 mg Intravenous Q3H PRN Loistine Chance, MD   0.5 mg at 07/20/21 1329   HYDROmorphone (DILAUDID) tablet 4 mg  4 mg Oral Q4H PRN Lenore Cordia, MD   4 mg at 07/21/21 0657   insulin aspart (novoLOG)  injection 0-9 Units  0-9 Units Subcutaneous TID WC Lenore Cordia, MD   3 Units at 07/23/21 0850   lactulose (CHRONULAC) 10 GM/15ML solution 20 g  20 g Oral Daily Georgette Shell, MD       lip balm (CARMEX) ointment   Topical PRN Dwyane Dee, MD       LORazepam (ATIVAN) tablet 0.5 mg  0.5 mg Oral Q6H PRN Lenore Cordia, MD   0.5 mg at 07/19/21 2057   metoprolol tartrate (LOPRESSOR) tablet 12.5 mg  12.5 mg Oral BID Georgette Shell, MD   12.5 mg at 07/23/21 0849   mirtazapine (REMERON) tablet 30 mg  30 mg Oral QHS Volanda Napoleon, MD   30 mg at 07/22/21 2105   OLANZapine (ZYPREXA) tablet 10 mg  10 mg Oral QHS Georgette Shell, MD   10 mg at 07/22/21 2105   ondansetron (ZOFRAN) tablet 4 mg  4 mg Oral Q6H PRN Lenore Cordia, MD       Or   ondansetron [facility]) injection 4 mg  4 mg Intravenous Q6H PRN Lenore Cordia, MD   4 mg at 07/23/21 0508   polyethylene glycol (MIRALAX / GLYCOLAX) packet 17 g  17 g Oral Daily Georgette Shell, MD   17 g at 07/22/21 2035   prochlorperazine (COMPAZINE) injection 10 mg  10 mg Intravenous Q6H PRN Lenore Cordia, MD   10 mg at 07/21/21 0815   promethazine (PHENERGAN) 12.5 mg in sodium chloride 0.9 % 50 mL IVPB  12.5 mg Intravenous Q6H PRN Dwyane Dee, MD   Stopping Infusion hung by another clincian at 07/20/21 1745   rivaroxaban (XARELTO) tablet 10 mg  10 mg Oral Q supper Volanda Napoleon, MD   10 mg at 07/22/21 1813   senna-docusate (Senokot-S) tablet 1 tablet  1 tablet Oral QHS PRN Lenore Cordia, MD       sodium chloride flush (NS) 0.9 % injection 10-40 mL  10-40 mL Intracatheter PRN Zada Finders R, MD       sodium chloride flush (NS) 0.9 % injection 3 mL  3 mL Intravenous Q12H Lenore Cordia, MD   3 mL at 07/22/21 2103     Discharge Medications: Please see discharge summary for a list of discharge medications.  Relevant Imaging Results:  Relevant Lab Results:   Additional Information ssn:[id number]  Leeroy Cha,  RN

## 2021-07-23 NOTE — Progress Notes (Signed)
Echocardiogram 2D Echocardiogram has been performed.  Oneal Deputy Vinayak Bobier RDCS 07/23/2021, 12:51 PM

## 2021-07-23 NOTE — Progress Notes (Signed)
Inpatient Diabetes Program Recommendations  AACE/ADA: New Consensus Statement on Inpatient Glycemic Control (2015)  Target Ranges:  Prepandial:   less than 140 mg/dL      Peak postprandial:   less than 180 mg/dL (1-2 hours)      Critically ill patients:  140 - 180 mg/dL   Lab Results  Component Value Date   GLUCAP 262 (H) 07/23/2021   HGBA1C 8.6 (H) 02/16/2020    Review of Glycemic Control  Diabetes history: DM2 Outpatient Diabetes medications: None Current orders for Inpatient glycemic control: Novolog 0-9 units TID with meals  HgbA1C - 8.6% Blood sugars in 200s.  Inpatient Diabetes Program Recommendations:    Increase Novolog to 0-15 units TID with meals, if appropriate.  Thank you. Lorenda Peck, RD, LDN, CDE Inpatient Diabetes Coordinator [phone]

## 2021-07-24 ENCOUNTER — Inpatient Hospital Stay (HOSPITAL_COMMUNITY): Payer: 59

## 2021-07-24 DIAGNOSIS — J9601 Acute respiratory failure with hypoxia: Secondary | ICD-10-CM | POA: Diagnosis not present

## 2021-07-24 LAB — GLUCOSE, CAPILLARY
Glucose-Capillary: 275 mg/dL — ABNORMAL HIGH (ref 70–99)
Glucose-Capillary: 224 mg/dL — ABNORMAL HIGH (ref 70–99)
Glucose-Capillary: 181 mg/dL — ABNORMAL HIGH (ref 70–99)
Glucose-Capillary: 198 mg/dL — ABNORMAL HIGH (ref 70–99)
Glucose-Capillary: 95 mg/dL (ref 70–99)

## 2021-07-24 LAB — CBC WITH DIFFERENTIAL/PLATELET
Band Neutrophils: 3 %
Basophils Relative: 0 %
Blasts: NONE SEEN %
Eosinophils Relative: 3 %
HCT: 36.9 % (ref 36.0–46.0)
Hemoglobin: 11.4 g/dL — ABNORMAL LOW (ref 12.0–15.0)
Lymphocytes Relative: 24 %
MCH: 26.2 pg (ref 26.0–34.0)
MCHC: 30.9 g/dL (ref 30.0–36.0)
MCV: 84.8 fL (ref 80.0–100.0)
Metamyelocytes Relative: NONE SEEN %
Monocytes Relative: 15 %
Myelocytes: NONE SEEN %
Neutrophils Relative %: 55 %
Platelets: 311 10*3/uL (ref 150–400)
Promyelocytes Relative: NONE SEEN %
RBC Morphology: NORMAL
RBC: 4.35 MIL/uL (ref 3.87–5.11)
RDW: 18.7 % — ABNORMAL HIGH (ref 11.5–15.5)
WBC Morphology: NORMAL
WBC: 2 10*3/uL — ABNORMAL LOW (ref 4.0–10.5)
nRBC: 0 % (ref 0.0–0.2)
nRBC: 2 /100 WBC — ABNORMAL HIGH

## 2021-07-24 LAB — COMPREHENSIVE METABOLIC PANEL
ALT: 20 U/L (ref 0–44)
AST: 14 U/L — ABNORMAL LOW (ref 15–41)
Albumin: 2 g/dL — ABNORMAL LOW (ref 3.5–5.0)
Alkaline Phosphatase: 65 U/L (ref 38–126)
Anion gap: 11 (ref 5–15)
BUN: 44 mg/dL — ABNORMAL HIGH (ref 6–20)
CO2: 22 mmol/L (ref 22–32)
Calcium: 8 mg/dL — ABNORMAL LOW (ref 8.9–10.3)
Chloride: 97 mmol/L — ABNORMAL LOW (ref 98–111)
Creatinine, Ser: 0.66 mg/dL (ref 0.44–1.00)
GFR, Estimated: >60 mL/min (ref >60)
Glucose, Bld: 302 mg/dL — ABNORMAL HIGH (ref 70–99)
Potassium: 4.1 mmol/L (ref 3.5–5.1)
Sodium: 130 mmol/L — ABNORMAL LOW (ref 135–145)
Total Bilirubin: 0.9 mg/dL (ref 0.3–1.2)
Total Protein: 5.9 g/dL — ABNORMAL LOW (ref 6.5–8.1)

## 2021-07-24 LAB — LACTIC ACID, PLASMA: Lactic Acid, Venous: 1.7 mmol/L (ref 0.5–1.9)

## 2021-07-24 LAB — APTT: aPTT: 27 seconds (ref 24–36)

## 2021-07-24 LAB — CA 125: Cancer Antigen (CA) 125: 2213 U/mL — ABNORMAL HIGH (ref 0.0–38.1)

## 2021-07-24 LAB — MRSA NEXT GEN BY PCR, NASAL: MRSA by PCR Next Gen: DETECTED — AB

## 2021-07-24 LAB — HEPARIN LEVEL (UNFRACTIONATED): Heparin Unfractionated: 0.61 IU/mL (ref 0.30–0.70)

## 2021-07-24 MED ORDER — OLANZAPINE 5 MG PO TABS: 5.0000 mg | ORAL_TABLET | Freq: Every day | ORAL | Status: DC

## 2021-07-24 MED ORDER — CHLORHEXIDINE GLUCONATE 0.12 % MT SOLN
15.0000 mL | Freq: Two times a day (BID) | OROMUCOSAL | Status: DC
  Administered 2021-07-24 – 2021-08-03 (×19): 15 mL via OROMUCOSAL
  Filled 2021-07-24 (×19): qty 15

## 2021-07-24 MED ORDER — LORAZEPAM 2 MG/ML IJ SOLN: 1.0000 mg | Freq: Four times a day (QID) | INTRAMUSCULAR | Status: DC | PRN

## 2021-07-24 MED ORDER — METOPROLOL TARTRATE 5 MG/5ML IV SOLN
2.5000 mg | Freq: Four times a day (QID) | INTRAVENOUS | Status: DC | PRN
  Administered 2021-07-24 – 2021-08-03 (×4): 2.5 mg via INTRAVENOUS
  Filled 2021-07-24 (×4): qty 5

## 2021-07-24 MED ORDER — PANTOPRAZOLE SODIUM 40 MG PO TBEC: 40.0000 mg | DELAYED_RELEASE_TABLET | Freq: Two times a day (BID) | ORAL | Status: DC

## 2021-07-24 MED ORDER — PANTOPRAZOLE SODIUM 40 MG IV SOLR
40.0000 mg | Freq: Two times a day (BID) | INTRAVENOUS | Status: DC
  Administered 2021-07-24 – 2021-08-04 (×24): 40 mg via INTRAVENOUS
  Filled 2021-07-24 (×22): qty 40

## 2021-07-24 MED ORDER — MUPIROCIN 2 % EX OINT
1.0000 "application " | TOPICAL_OINTMENT | Freq: Two times a day (BID) | CUTANEOUS | Status: AC
  Administered 2021-07-24 – 2021-07-29 (×10): 1 via NASAL
  Filled 2021-07-24 (×2): qty 22

## 2021-07-24 MED ORDER — SODIUM CHLORIDE 0.9 % IV SOLN: INTRAVENOUS | Status: DC

## 2021-07-24 MED ORDER — METOPROLOL TARTRATE 5 MG/5ML IV SOLN
5.0000 mg | Freq: Once | INTRAVENOUS | Status: DC
Start: 2021-07-24 — End: 2021-07-24

## 2021-07-24 MED ORDER — ALBUMIN HUMAN 25 % IV SOLN
25.0000 g | Freq: Four times a day (QID) | INTRAVENOUS | Status: AC
  Administered 2021-07-24 – 2021-07-25 (×6): 25 g via INTRAVENOUS
  Filled 2021-07-24 (×5): qty 100

## 2021-07-24 MED ORDER — SODIUM CHLORIDE 0.9 % IV SOLN
150.0000 mg | Freq: Once | INTRAVENOUS | Status: AC
  Administered 2021-07-24: 150 mg via INTRAVENOUS
  Filled 2021-07-24: qty 5

## 2021-07-24 MED ORDER — LORAZEPAM 2 MG/ML IJ SOLN
0.5000 mg | Freq: Four times a day (QID) | INTRAMUSCULAR | Status: DC | PRN
  Administered 2021-07-28 – 2021-08-02 (×2): 0.5 mg via INTRAVENOUS
  Filled 2021-07-24 (×2): qty 1

## 2021-07-24 MED ORDER — HEPARIN (PORCINE) 25000 UT/250ML-% IV SOLN
1350.0000 [IU]/h | INTRAVENOUS | Status: DC
  Administered 2021-07-24: 1300 [IU]/h via INTRAVENOUS
  Administered 2021-07-25 – 2021-07-26 (×2): 1200 [IU]/h via INTRAVENOUS
  Filled 2021-07-24 (×3): qty 250

## 2021-07-24 MED ORDER — METRONIDAZOLE 500 MG/100ML IV SOLN
500.0000 mg | Freq: Two times a day (BID) | INTRAVENOUS | Status: DC
  Administered 2021-07-24 – 2021-07-27 (×6): 500 mg via INTRAVENOUS
  Filled 2021-07-24 (×6): qty 100

## 2021-07-24 MED ORDER — MIRTAZAPINE 15 MG PO TABS: 7.5000 mg | ORAL_TABLET | Freq: Every day | ORAL | Status: DC

## 2021-07-24 MED ORDER — SODIUM CHLORIDE 0.9 % IV SOLN
2.0000 g | Freq: Three times a day (TID) | INTRAVENOUS | Status: DC
  Administered 2021-07-25 – 2021-07-27 (×8): 2 g via INTRAVENOUS
  Filled 2021-07-24 (×8): qty 2

## 2021-07-24 MED ORDER — SCOPOLAMINE 1 MG/3DAYS TD PT72
1.0000 | MEDICATED_PATCH | TRANSDERMAL | Status: DC
  Administered 2021-07-24 – 2021-08-02 (×4): 1.5 mg via TRANSDERMAL
  Filled 2021-07-24 (×4): qty 1

## 2021-07-24 MED ORDER — ORAL CARE MOUTH RINSE
15.0000 mL | Freq: Two times a day (BID) | OROMUCOSAL | Status: DC
  Administered 2021-07-25 – 2021-08-03 (×16): 15 mL via OROMUCOSAL

## 2021-07-24 NOTE — Consult Note (Signed)
Reason for Consult:Possible bowel ischemia Referring Physician: Leslye Peer, MD  Angela Wilson is an 55 y.o. female.  HPI:  Pt is a 55 yo F dx with pancreatic cancer 01/2020. I saw her and was going to plan surgery, but she was found to have a peritoneal implant on staging scans.  Biopsy was positive for metastatic cancer.  Since that time, she has been seeing Dr. Marin Olp and getting treated.  She has had Gemzar abraxane x 7 cycles, once progression was seen, she was switched to folfirinox x 4 cycles.  This was then chnaged to gemzar/xeloda for 2 cycles, stopped for progression.  She received 5 FU/leucovorin x 4-5 cycles, stopped for progression.  She received XRT to pelvic mass for palliation.  She just started 5-FU and liposomal irinotecan prior to admission.    She has liver mets, peritoneal mets, the largest of which are in the pelvis (cystic mass 15.6 cm in greatest dimension) and a peritoneal implant on the anterior abdominal wall that was not measured on the report, but appears around 5-6 cm.  She also has had a DVT in the right IJ (port associated) and is on xarelto.   She was admitted 06/23/2021 with worsened abdominal pain, respiratory failure, ascites, hyponatremia, n/v.  These have been treated symptomatically.  She received paracentesis x 2.  Her daughter is understandably upset that it seems like she hasn't been mobilized and is being kept too sleepy.  She was able to get to the bathroom with assistance prior to coming in.  Her daughter is very intelligent and knows the timeline of her cancer care very well.    She has been on fentanyl patches as an outpatient and hadn't been stooling well.  She was placed on miralax and had some diarrhea.  There is a flexiseal in place now.  She got a repeat CT today due to concerns of significant distention.  Her colon is very dilated without significant stool burden.  She has no evidence of obstruction.  Numerous spots of nodularity were seen on the  mesentery and omentum in addition to the larger mets.  There was a question of a tiny bit of intravenous gas and the possibility of ischemic bowel was raised.  The patient is not able to participate in the interview due to sleepiness.    Past Medical History:  Diagnosis Date   Diabetes mellitus without complication ([place])    Family history of brain cancer    Family history of breast cancer    Family history of kidney cancer    Family history of ovarian cancer    GERD (gastroesophageal reflux disease)    Hyperlipidemia    Hypertension    Kidney stone    Pancreatic cancer ([place]) 02/03/2020   Shingles    Type 2 diabetes mellitus ([place])     Past Surgical History:  Procedure Laterality Date   CESAREAN SECTION     x 4   IR IMAGING GUIDED PORT INSERTION  02/18/2020   KIDNEY STONE SURGERY      Family History  Problem Relation Age of Onset   Heart attack Maternal Uncle    Breast cancer Paternal Aunt        dx. in her 9s   Brain cancer Paternal Uncle 52   Kidney cancer Cousin        dx. <50   Ovarian cancer Cousin    Breast cancer Cousin        dx. "young"   Ovarian cancer Cousin  Cancer Cousin        Cancer on the back, dx. in her 75s   Lung cancer Cousin     Social History:  reports that she has never smoked. She has never used smokeless tobacco. She reports that she does not drink alcohol and does not use drugs.  Allergies:  Allergies  Allergen Reactions   Ivp Dye [Iodinated Diagnostic Agents] Itching and Rash   Morphine And Related Hives   Penicillins Rash    Medications:  Current Facility-Administered Medications for the 07/02/2021 encounter [facility] Encounter)  Medication   ondansetron (ZOFRAN) 8 mg in sodium chloride 0.9 % 50 mL IVPB   Current Meds  Medication Sig   albuterol (PROVENTIL HFA;VENTOLIN HFA) 108 (90 Base) MCG/ACT inhaler Inhale 2 puffs into the lungs every 4 (four) hours as needed for wheezing or shortness of breath.   dicyclomine (BENTYL) 10 MG  capsule Take 10 mg by mouth daily as needed for spasms.   diphenoxylate-atropine (LOMOTIL) 2.5-0.025 MG tablet Take 2 tablets by mouth 4 (four) times daily as needed for diarrhea or loose stools.   fentaNYL (DURAGESIC) 75 MCG/HR Place 2 patches onto the skin every other day. (Patient taking differently: Place 1-2 patches onto the skin every other day.)   gabapentin (NEURONTIN) 300 MG capsule TAKE 2 CAPSULES BY MOUTH IN THE DAYTIME AND 2 CAPSULES AT BEDTIME (Patient taking differently: Take 600 mg by mouth 3 (three) times daily.)   HYDROcodone bit-homatropine (HYCODAN) 5-1.5 MG/5ML syrup Take 5 mLs by mouth every 6 (six) hours as needed for cough.   HYDROmorphone (DILAUDID) 4 MG tablet Take 1 tablet (4 mg total) by mouth every 4 (four) hours as needed for severe pain.   Lactulose 20 GM/30ML SOLN Take 30 mLs (20 g total) by mouth 3 (three) times daily as needed. (Patient taking differently: Take 30 mLs by mouth 3 (three) times daily as needed (for constipation).)   lidocaine-prilocaine (EMLA) cream APPLY TO PORT 1 HOUR BEFORE ACCESS. (Patient taking differently: Apply 1 application topically See admin instructions. Apply to port 1 hour before access)   LORazepam (ATIVAN) 0.5 MG tablet Take 0.5 mg every six hours as needed for nausea.   OLANZapine (ZYPREXA) 10 MG tablet Take 1 tablet (10 mg total) by mouth at bedtime. Take every day for nausea/vomiting   omeprazole (PRILOSEC) 40 MG capsule Take 40 mg by mouth daily as needed (acid reflux).   ondansetron (ZOFRAN) 4 MG tablet Take 1 tablet (4 mg total) by mouth every 6 (six) hours as needed for nausea.   rivaroxaban (XARELTO) 20 MG TABS tablet Take 1 tablet (20 mg total) by mouth daily with supper.     Results for orders placed or performed during the hospital encounter of 07/09/2021 (from the past 48 hour(s))  CBC with Differential/Platelet     Status: Abnormal   Collection Time: 07/23/21  3:45 AM  Result Value Ref Range   WBC 3.2 (L) 4.0 - 10.5 K/uL    RBC 4.03 3.87 - 5.11 MIL/uL   Hemoglobin 10.7 (L) 12.0 - 15.0 g/dL   HCT 34.7 (L) 36.0 - 46.0 %   MCV 86.1 80.0 - 100.0 fL   MCH 26.6 26.0 - 34.0 pg   MCHC 30.8 30.0 - 36.0 g/dL   RDW 17.9 (H) 11.5 - 15.5 %   Platelets 252 150 - 400 K/uL   nRBC 0.0 0.0 - 0.2 %   Neutrophils Relative % 64 %   Neutro Abs 2.1 1.7 - 7.7 K/uL  Lymphocytes Relative 25 %   Lymphs Abs 0.8 0.7 - 4.0 K/uL   Monocytes Relative 7 %   Monocytes Absolute 0.2 0.1 - 1.0 K/uL   Eosinophils Relative 3 %   Eosinophils Absolute 0.1 0.0 - 0.5 K/uL   Basophils Relative 0 %   Basophils Absolute 0.0 0.0 - 0.1 K/uL   Immature Granulocytes 1 %   Abs Immature Granulocytes 0.02 0.00 - 0.07 K/uL    Comment: Performed at [facility], [place] [address], [place], [place] [address]  Comprehensive metabolic panel     Status: Abnormal   Collection Time: 07/23/21  3:45 AM  Result Value Ref Range   Sodium 133 (L) 135 - 145 mmol/L   Potassium 3.9 3.5 - 5.1 mmol/L   Chloride 98 98 - 111 mmol/L   CO2 25 22 - 32 mmol/L   Glucose, Bld 246 (H) 70 - 99 mg/dL    Comment: Glucose reference range applies only to samples taken after fasting for at least 8 hours.   BUN 31 (H) 6 - 20 mg/dL   Creatinine, Ser 0.46 0.44 - 1.00 mg/dL   Calcium 7.9 (L) 8.9 - 10.3 mg/dL   Total Protein 5.6 (L) 6.5 - 8.1 g/dL   Albumin 2.0 (L) 3.5 - 5.0 g/dL   AST 24 15 - 41 U/L   ALT 29 0 - 44 U/L   Alkaline Phosphatase 57 38 - 126 U/L   Total Bilirubin 0.7 0.3 - 1.2 mg/dL   GFR, Estimated >60 >60 mL/min    Comment: (NOTE) Calculated using the CKD-EPI Creatinine Equation (2021)    Anion gap 10 5 - 15    Comment: Performed at [facility], [place] [address], [place], [place] [address]  CA 125     Status: Abnormal   Collection Time: 07/23/21  3:45 AM  Result Value Ref Range   Cancer Antigen (CA) 125 2,213.0 (H) 0.0 - 38.1 U/mL    Comment: (NOTE) Roche [employer] Electrochemiluminescence Immunoassay (ECLIA) Values  obtained with different assay methods or kits cannot be used interchangeably.  Results cannot be interpreted as absolute evidence of the presence or absence of malignant disease. Performed At: [facility] [place], [place] [id number] Rush Farmer MD JJ:[id number]   Prealbumin     Status: Abnormal   Collection Time: 07/23/21  3:45 AM  Result Value Ref Range   Prealbumin 9.0 (L) 18 - 38 mg/dL    Comment: Performed at [facility], [place] [address] [place],  [address]  Glucose, capillary     Status: Abnormal   Collection Time: 07/23/21  7:50 AM  Result Value Ref Range   Glucose-Capillary 249 (H) 70 - 99 mg/dL    Comment: Glucose reference range applies only to samples taken after fasting for at least 8 hours.  Glucose, capillary     Status: Abnormal   Collection Time: 07/23/21 11:25 AM  Result Value Ref Range   Glucose-Capillary 262 (H) 70 - 99 mg/dL    Comment: Glucose reference range applies only to samples taken after fasting for at least 8 hours.  Glucose, capillary     Status: Abnormal   Collection Time: 07/23/21  4:24 PM  Result Value Ref Range   Glucose-Capillary 192 (H) 70 - 99 mg/dL    Comment: Glucose reference range applies only to samples taken after fasting for at least 8 hours.  CBC with Differential/Platelet     Status: Abnormal   Collection Time:  07/24/21  4:59 AM  Result Value Ref Range   WBC 2.0 (L) 4.0 - 10.5 K/uL   RBC 4.35 3.87 - 5.11 MIL/uL   Hemoglobin 11.4 (L) 12.0 - 15.0 g/dL   HCT 36.9 36.0 - 46.0 %   MCV 84.8 80.0 - 100.0 fL   MCH 26.2 26.0 - 34.0 pg   MCHC 30.9 30.0 - 36.0 g/dL   RDW 18.7 (H) 11.5 - 15.5 %   Platelets 311 150 - 400 K/uL   nRBC 0.0 0.0 - 0.2 %   Neutrophils Relative % 55 %   Band Neutrophils 3 %   Lymphocytes Relative 24 %   Monocytes Relative 15 %   Eosinophils Relative 3 %   Basophils Relative 0 %   WBC Morphology NORMAL    RBC Morphology NORMAL    Smear Review DONE    nRBC 2  (H) 0 /100 WBC   Metamyelocytes Relative NONE SEEN %   Myelocytes NONE SEEN %   Promyelocytes Relative NONE SEEN %   Blasts NONE SEEN %    Comment: Performed at [facility], [place] [address] [place], [place] [address]  Glucose, capillary     Status: Abnormal   Collection Time: 07/24/21  7:24 AM  Result Value Ref Range   Glucose-Capillary 275 (H) 70 - 99 mg/dL    Comment: Glucose reference range applies only to samples taken after fasting for at least 8 hours.  Comprehensive metabolic panel     Status: Abnormal   Collection Time: 07/24/21  9:48 AM  Result Value Ref Range   Sodium 130 (L) 135 - 145 mmol/L   Potassium 4.1 3.5 - 5.1 mmol/L   Chloride 97 (L) 98 - 111 mmol/L   CO2 22 22 - 32 mmol/L   Glucose, Bld 302 (H) 70 - 99 mg/dL    Comment: Glucose reference range applies only to samples taken after fasting for at least 8 hours.   BUN 44 (H) 6 - 20 mg/dL   Creatinine, Ser 0.66 0.44 - 1.00 mg/dL   Calcium 8.0 (L) 8.9 - 10.3 mg/dL   Total Protein 5.9 (L) 6.5 - 8.1 g/dL   Albumin 2.0 (L) 3.5 - 5.0 g/dL   AST 14 (L) 15 - 41 U/L   ALT 20 0 - 44 U/L   Alkaline Phosphatase 65 38 - 126 U/L   Total Bilirubin 0.9 0.3 - 1.2 mg/dL   GFR, Estimated >60 >60 mL/min    Comment: (NOTE) Calculated using the CKD-EPI Creatinine Equation (2021)    Anion gap 11 5 - 15    Comment: Performed at [facility], [place] [address], [place],  [address]  Glucose, capillary     Status: Abnormal   Collection Time: 07/24/21 12:09 PM  Result Value Ref Range   Glucose-Capillary 224 (H) 70 - 99 mg/dL    Comment: Glucose reference range applies only to samples taken after fasting for at least 8 hours.  Glucose, capillary     Status: Abnormal   Collection Time: 07/24/21  4:28 PM  Result Value Ref Range   Glucose-Capillary 181 (H) 70 - 99 mg/dL    Comment: Glucose reference range applies only to samples taken after fasting for at least 8 hours.  Glucose, capillary      Status: Abnormal   Collection Time: 07/24/21  6:20 PM  Result Value Ref Range   Glucose-Capillary 198 (H) 70 - 99 mg/dL    Comment: Glucose reference range applies  only to samples taken after fasting for at least 8 hours.  Lactic acid, plasma     Status: None   Collection Time: 07/24/21  7:49 PM  Result Value Ref Range   Lactic Acid, Venous 1.7 0.5 - 1.9 mmol/L    Comment: Performed at [facility], [place] [address] [place], [place] [address]  Glucose, capillary     Status: None   Collection Time: 07/24/21  9:21 PM  Result Value Ref Range   Glucose-Capillary 95 70 - 99 mg/dL    Comment: Glucose reference range applies only to samples taken after fasting for at least 8 hours.    CT ABDOMEN PELVIS WO CONTRAST  Result Date: 07/24/2021 CLINICAL DATA:  Abdominal distension. Metastatic pancreatic carcinoma. IV contrast allergy. Vomiting and diarrhea. EXAM: CT ABDOMEN AND PELVIS WITHOUT CONTRAST TECHNIQUE: Multidetector CT imaging of the abdomen and pelvis was performed following the standard protocol without IV contrast. COMPARISON:  06/22/2021 FINDINGS: Lower chest: Atelectasis in the left lower lobe, right middle lobe, and right lower lobe. Large right pleural effusion. Small stable pericardial effusion. Central catheter tip terminates in the right atrium. Anterior pericardial node 0.6 cm in short axis on image 8 series 2. Hepatobiliary: Heterogeneity in the liver probably reflecting metastatic lesions. Nonvisualization of the gallbladder, presumed surgically absent. Pancreas: Solid mass of the pancreatic tail 4.6 by 2.9 cm, roughly stable. Spleen: Unremarkable Adrenals/Urinary Tract: Punctate bilateral nonobstructive renal calculi. Adrenal glands unremarkable. Stomach/Bowel: Frothy fluid levels in the distal colon and rectum with diarrheal process. Dilated colon down to the level of the sigmoid, there is some adjacent nodularity but a definite cause for the transition is not  observed no substantially dilated small bowel currently. Vascular/Lymphatic: Patency not assessed due to lack of IV contrast. No periaortic adenopathy is identified. There appears to be a trace amount of gas in a likely venous structure tracking along the anterior omentum on image 41 series 2, this could be related to gas from an IV as I do not demonstrate other classic hallmarks of portal venous gas along more characteristic locations for example in the mesenteric bowel. Reproductive: Uterus appears mildly flattened anteriorly, with large conglomerate pelvic cystic mass involving both adnexa measuring about 15.6 by 11.6 cm on image 66 of series 2, roughly similar to prior. Other: Substantial ascites similar to prior with mesenteric edema and extensive scattered nodularity along the mesentery and omentum compatible peritoneal tumor. 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 IMPRESSION: 1. Trace gas in tiny venous structures to the left of the stomach and also along the anterior omentum. Although not a typical distribution for portal venous gas, there is also dilated colon up to the level of the sigmoid colon (without visible pneumatosis). Correlate with lactate levels in assessing for the possibility of ischemic bowel. 2. Generally similar appearance of large right effusion, substantial ascites, extensive peritoneal metastatic disease, large cystic pelvic masses, pancreatic tail tumor, and some heterogeneity in the liver suspicious for hepatic metastatic lesions. 3. Bilateral punctate nonobstructive nephrolithiasis. 4. Atelectasis in the left lower lobe, right middle lobe, and right lower lobe. 5. Stable small pericardial effusion. Critical Value/emergent results were called by telephone at the time of interpretation on 07/24/2021 at 6:45 pm to provider Dr. Verlee Monte, who verbally acknowledged these results. Electronically Signed   By: Van Clines M.D.   On:  07/24/2021 18:46   DG Chest 1 View  Result Date: 07/24/2021 CLINICAL DATA:  Abdominal pain, nausea  and vomiting, pancreatic cancer EXAM: CHEST  1 VIEW COMPARISON:  06/29/2021 chest radiograph. FINDINGS: Right internal jugular Port-A-Cath terminates over the right atrium, stable. Stable cardiomediastinal silhouette with normal heart size. No pneumothorax. Small to moderate right and small left pleural effusions, slightly increased. No overt pulmonary edema. Hazy bibasilar lung opacities, mildly worsened, favor atelectasis. IMPRESSION: Small to moderate right and small left pleural effusions, slightly increased. Hazy bibasilar lung opacities, mildly worsened, favor atelectasis. Electronically Signed   By: Ilona Sorrel M.D.   On: 07/24/2021 11:26   DG Abd 1 View  Result Date: 07/24/2021 CLINICAL DATA:  Abdominal pain, nausea and vomiting EXAM: ABDOMEN - 1 VIEW COMPARISON:  07/21/2021 abdominal radiographs FINDINGS: No disproportionately dilated small bowel loops. Prominent gaseous distension of the colon, most prominent in the transverse colon, worsened. No evidence of pneumatosis or pneumoperitoneum. No radiopaque nephrolithiasis. IMPRESSION: Prominent gaseous distension of the colon, most prominent in the transverse colon, worsened. Findings are suggestive of colonic ileus. No disproportionately dilated small bowel loops to suggest small bowel obstruction. Electronically Signed   By: Ilona Sorrel M.D.   On: 07/24/2021 11:25   ECHOCARDIOGRAM LIMITED  Result Date: 07/23/2021    ECHOCARDIOGRAM LIMITED REPORT   Patient Name:   SHANTAE VANTOL Date of Exam: 07/23/2021 Medical Rec #:  [id number]         Height:       66.0 in Accession #:    [id number]        Weight:       211.0 lb Date of Birth:  04/27/1966          BSA:          2.046 m Patient Age:    65 years          BP:           127/83 mmHg Patient Gender: F                 HR:           113 bpm. Exam Location:  Inpatient Procedure: Limited Echo, Color  Doppler and Cardiac Doppler Indications:    R94.31 Abnormal EKG  History:        Patient has no prior history of Echocardiogram examinations.                 Risk Factors:Hypertension, Diabetes, Dyslipidemia and Stage IV                 Pancreatic Cancer.  Sonographer:    Raquel Sarna Senior RDCS Referring Phys: [id number] Noland Fordyce MATHEWS  Sonographer Comments: Technically difficult study due to poor echo windows, suboptimal parasternal window, suboptimal apical window and suboptimal subcostal window. IMPRESSIONS  1. Limited study with very difficult windows for TTE. Grossly LV function appears preserved 60-65% but cannot assess for WMA. RV not well visualized.  2. Left ventricular ejection fraction, by estimation, is 60 to 65%. The left ventricle has normal function. Left ventricular endocardial border not optimally defined to evaluate regional wall motion.  3. Right ventricular systolic function was not well visualized. The right ventricular size is not well visualized. There is normal pulmonary artery systolic pressure.  4. The mitral valve was not well visualized.  5. The aortic valve was not well visualized.  6. The inferior vena cava is normal in size with <50% respiratory variability, suggesting right atrial pressure of 8 mmHg. FINDINGS  Left Ventricle: Left ventricular ejection fraction, by estimation, is 60 to 65%. The left ventricle  has normal function. Left ventricular endocardial border not optimally defined to evaluate regional wall motion. Right Ventricle: The right ventricular size is not well visualized. Right vetricular wall thickness was not well visualized. Right ventricular systolic function was not well visualized. There is normal pulmonary artery systolic pressure. The tricuspid regurgitant velocity is 2.57 m/s, and with an assumed right atrial pressure of 8 mmHg, the estimated right ventricular systolic pressure is 33.2 mmHg. Left Atrium: Left atrial size was not well visualized. Right Atrium: Right  atrial size was not well visualized. Pericardium: There is no evidence of pericardial effusion. Presence of pericardial fat pad. Mitral Valve: The mitral valve was not well visualized. Tricuspid Valve: The tricuspid valve is grossly normal. Tricuspid valve regurgitation is mild . No evidence of tricuspid stenosis. Aortic Valve: The aortic valve was not well visualized. Pulmonic Valve: The pulmonic valve was not well visualized. Pulmonic valve regurgitation is not visualized. No evidence of pulmonic stenosis. Aorta: The aortic root is normal in size and structure. Venous: The inferior vena cava is normal in size with less than 50% respiratory variability, suggesting right atrial pressure of 8 mmHg. AORTIC VALVE LVOT Vmax:   87.40 cm/s LVOT Vmean:  57.400 cm/s LVOT VTI:    0.127 m TRICUSPID VALVE TR Peak grad:   26.4 mmHg TR Vmax:        257.00 cm/s  SHUNTS Systemic VTI: 0.13 m Eleonore Chiquito MD Electronically signed by Eleonore Chiquito MD Signature Date/Time: 07/23/2021/1:48:15 PM    Final     Review of Systems  Unable to perform ROS: Other  very sleepy.   Blood pressure 133/79, pulse (!) 136, temperature 98.4 F (36.9 C), temperature source Axillary, resp. rate (!) 28, height 5\' 6"  (1.676 m), weight 95.7 kg, SpO2 95 %. Physical Exam Constitutional:      General: She is in acute distress.     Appearance: She is ill-appearing. She is not toxic-appearing or diaphoretic.     Comments: A bit restless with exam, but no grimacing with abdominal exam.  HENT:     Head: Normocephalic.     Nose: Nose normal.     Mouth/Throat:     Mouth: Mucous membranes are dry.  Neck:     Comments: Right neck sl swollen Cardiovascular:     Rate and Rhythm: Regular rhythm. Tachycardia present.     Pulses: Normal pulses.     Heart sounds: Normal heart sounds. No murmur heard. Pulmonary:     Effort: Respiratory distress (mild) present.     Breath sounds: No wheezing or rhonchi.     Comments: Decreased breath sounds bilateral  lower chest, right worse than left.   Abdominal:     General: There is distension.     Palpations: Abdomen is soft. There is mass (palpable firm mass in periumbilical region).     Tenderness: There is no abdominal tenderness. There is no guarding or rebound.  Musculoskeletal:        General: Swelling present.     Cervical back: Neck supple.  Skin:    General: Skin is warm and dry.     Capillary Refill: Capillary refill takes 2 to 3 seconds.     Coloration: Skin is pale. Skin is not jaundiced.     Findings: Bruising (left calf) present. No erythema, lesion or rash.  Neurological:     Mental Status: She is lethargic.     Comments: Unable to assess due to mental status  Psychiatric:     Comments:  Unable to assess due to mental status.      Assessment/Plan: Metastatic pancreatic cancer Abdominal distention Abnormal CT VTE Hypoxia Protein caloric malnutrition  I do not think she has ischemic bowel. She does not appear tender.  I think unfortunately, the bowels reflect the global issues.  She is not constipated or obstructed.  Lactate is normal.    Even if she did have ischemic bowel, she is not a candidate for surgery given the numerous peritoneal implants and ascites.  She appears to be nearing the end of life as she has progressed through many chemotherapeutic regimens and is now unable to maintain nutrition to have any more therapy.  I concur with palliative care.    Stark Klein 07/24/2021, 9:26 PM

## 2021-07-24 NOTE — Progress Notes (Signed)
Patient continues to have liquid stools and episodes of vomiting, small to moderate in amount of both. Abdomen is still distended with more firmness noted. Md notified and orders received.Eulas Post, RN

## 2021-07-24 NOTE — Progress Notes (Signed)
Pharmacy Antibiotic Note  Angela Wilson is a 55 y.o. female admitted on 07/14/2021 with  IAI .  Pharmacy has been consulted for cefepime dosing.  Plan: Metronidazole 500 mg IV q12h per MD Cefepime 2 gr IV q8h  Monitor clinical course, renal function, cultures as available   Height: 5\' 6"  (167.6 cm) Weight: 95.7 kg (210 lb 15.7 oz) IBW/kg (Calculated) : 59.3  Temp (24hrs), Avg:99.4 F (37.4 C), Min:97.8 F (36.6 C), Max:100.4 F (38 C)  Recent Labs  Lab 07/19/21 0408 07/20/21 0334 07/21/21 0514 07/23/21 0345 07/24/21 0459 07/24/21 0948  WBC 3.8* 3.5* 4.0 3.2* 2.0*  --   CREATININE 0.46 0.37* 0.41* 0.46  --  0.66    Estimated Creatinine Clearance: 94.9 mL/min (by C-G formula based on SCr of 0.66 mg/dL).    Allergies  Allergen Reactions   Ivp Dye [Iodinated Diagnostic Agents] Itching and Rash   Morphine And Related Hives   Penicillins Rash    Antimicrobials this admission: 11/5 metronidazole >>  11/5 cefepime >>   Dose adjustments this admission:    Microbiology results: 10/27 bcx x2: NGF  11/5: BCx:  11/5 MRSA PCR: IP   Thank you for allowing pharmacy to be a part of this patient's care.   Royetta Asal, PharmD, BCPS 07/24/2021 7:45 PM

## 2021-07-24 NOTE — Progress Notes (Signed)
[name] is really no better.  She had diarrhea yesterday.  This probably was from laxatives.  She had vomiting.  We will see about a abdominal x-ray to see if there is any obstruction.  It would not surprise me at this was the case.  She just is not eating that much.  Her daughter said that she ate a little bit better yesterday.  We really not making much progress.  Unfortunately, there is not much else we can really do for her.  She is still full code.  She is still quite tachycardic.  She had a EKG yesterday which showed sinus tachycardia.  An echocardiogram was done which showed a ejection fraction of 60-65%.  She does not appear to be hurting right now.  From what the daughter says, there was some blood with the diarrhea.  Her labs show white count 2000.  Hemoglobin 11.4.  Platelet count 3 11,000.  The white cell count is headed down secondary to the chemotherapy that she had.  She has not neutropenic.  We will have to watch this.  Her metabolic panel is not back yet.  Her vital signs show temperature of 97.8.  Pulse 140.  Blood pressure 130/77.  Her lungs still sound relatively clear.  She has good air movement.  Cardiac exam is tachycardic but regular.  Abdomen is soft.  I really cannot hear much away bowel sounds.  Again, it is hard to say if she is really ever going to improve.  My sense is that she just is not strong enough to improve.  Hopefully, she is not obstructed.  I cannot hear any bowel sounds I will know if she may have been ileus.  We will see if the Emend can help with the vomiting.  I do appreciate everybody's help with her.  I know this is quite challenging.  Lattie Haw, MD  Psalm 23:4

## 2021-07-24 NOTE — Consult Note (Signed)
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 Her cancer has unfortunately progressed despite treatment. She was found to have ileus, recurrent MPE, some ascites. On 11/2 IR evaluated ascites and there was too small a pocket in proximity to distended bowel loops. With progressive abdominal distension, nausea, and increased work of breathing CT A/P obtained on 11/5 revealing portal venous gas and remarkably distended colon, ?borderline transition point per radiology and PCCM consulted.  Pertinent  Medical History  Stage IV pancreatic cancer MPE  Peritoneal carcinomatosis RIJ thrombus DM2 HTN  Significant Hospital Events: Including procedures, antibiotic start and stop dates in addition to other pertinent events   10/27 -11/5 admitted undergoing workup of n/v, bowel distension  Interim History / Subjective:  N/a  Objective   Blood pressure (!) 134/91, pulse (!) 147, temperature 100.1 F (37.8 C), temperature source Oral, resp. rate (!) 41, height 5\' 6"  (1.676 m), weight 95.7 kg, SpO2 95 %.        Intake/Output Summary (Last 24 hours) at 07/24/2021 1946 Last data filed at 07/24/2021 1815 Gross per 24 hour  Intake 851.04 ml  Output 10 ml  Net 841.04 ml   Filed Weights   07/22/21 0500 07/22/21 0600 07/23/21 0447  Weight: 97.2 kg 94.8 kg 95.7 kg    Examination: General appearance: 55 y.o., female, drowsy after pain medication Eyes: PERRL, pinpoitn HENT: NCAT; dry MM Lungs: diminished on R, no crackles, no wheeze, with mildly incraesed WOB CV: tachycardic, RR, no MRGs  Abdomen: Soft,grimaces with ; non-distended, BS present  Extremities:  2+ ble edema/anasarcic, legs are cool Neuro: inattentive/drowsy, grossly nonfocal   Resolved Hospital Problem list     Assessment & Plan:   # Acute hypoxic respiratory failure: Likely predominantly related to poor diaphragmatic excursion with massive abdominal distension, right MPE to lesser extent. - f/u response to decompression - could consider thoracentesis but she has been on xarelto   # Suspected bowel ischemia  # Colonic distension with possible obstruction She has evidently been having quite a bit of diarrhea on bowel regimen. - hold laxatives, if continues to have liquid stools may need to consider checking c diff - NGT for decompression - check lactic  - surgery consulted  - blood cultures, start cefepime/flagyl  # Acute toxic metabolic encephalopathy: - reassess after opioid washes out  # stage IV adenocarcinoma # peritoneal carcinomatosis # right MPE - onc following, do not foresee being candidate for further treatment - ongoing GoC discussion with palliative  # RIJ thrombus: - stop xarelto, start hep gtt  # Moderate to severe protein calorie malnutrition   Best Practice (right click and "Reselect all SmartList Selections" daily)   Per [facility]  Labs   CBC: Recent Labs  Lab 07/18/21 0353 07/19/21 0408 07/20/21 0334 07/21/21 0514 07/23/21 0345 07/24/21 0459  WBC 3.8* 3.8* 3.5* 4.0 3.2* 2.0*  NEUTROABS 2.7 2.7 2.3 2.7 2.1  --   HGB 11.0* 10.2* 10.2* 10.2* 10.7* 11.4*  HCT 35.4* 33.1* 33.4* 34.3* 34.7* 36.9  MCV 85.7 86.0 88.4 87.5 86.1 84.8  PLT 155 145* 144* 182 252 967    Basic Metabolic Panel: Recent Labs  Lab 07/18/21  6712 07/19/21 0408 07/20/21 0334 07/21/21 0514 07/23/21 0345 07/24/21 0948  NA 132* 131* 133* 132* 133* 130*  K 4.1 3.8 3.8 3.8 3.9 4.1  CL 95* 97* 98 98 98 97*  CO2 28 27 29 29 25 22   GLUCOSE 232* 239* 241* 261* 246* 302*  BUN 13 14 14 17  31* 44*  CREATININE 0.35* 0.46 0.37* 0.41* 0.46 0.66  CALCIUM 7.9* 7.5* 7.7*  7.5* 7.9* 8.0*  MG 2.0 1.8 1.9 1.9  --   --    GFR: Estimated Creatinine Clearance: 94.9 mL/min (by C-G formula based on SCr of 0.66 mg/dL). Recent Labs  Lab 07/20/21 0334 07/21/21 0514 07/23/21 0345 07/24/21 0459  WBC 3.5* 4.0 3.2* 2.0*    Liver Function Tests: Recent Labs  Lab 07/19/21 0408 07/20/21 0334 07/21/21 0514 07/23/21 0345 07/24/21 0948  AST 18 27 27 24  14*  ALT 16 22 27 29 20   ALKPHOS 43 45 47 57 65  BILITOT 0.7 0.8 0.8 0.7 0.9  PROT 5.3* 5.4* 5.4* 5.6* 5.9*  ALBUMIN 1.8* 2.0* 2.0* 2.0* 2.0*   No results for input(s): LIPASE, AMYLASE in the last 168 hours. No results for input(s): AMMONIA in the last 168 hours.  ABG No results found for: PHART, PCO2ART, PO2ART, HCO3, TCO2, ACIDBASEDEF, O2SAT   Coagulation Profile: No results for input(s): INR, PROTIME in the last 168 hours.  Cardiac Enzymes: No results for input(s): CKTOTAL, CKMB, CKMBINDEX, TROPONINI in the last 168 hours.  HbA1C: Hemoglobin A1C  Date/Time Value Ref Range Status  01/07/2020 12:04 PM 9.6 (A) 4.0 - 5.6 % Final   Hgb A1c MFr Bld  Date/Time Value Ref Range Status  02/16/2020 04:06 AM 8.6 (H) 4.8 - 5.6 % Final    Comment:    (NOTE) Pre diabetes:          5.7%-6.4% Diabetes:              >6.4% Glycemic control for   <7.0% adults with diabetes   11/07/2012 10:40 AM 7.4 (H) <5.7 % Final    Comment:    (NOTE)                                                                       According to the ADA Clinical Practice Recommendations for 2011, when HbA1c is used as a screening test:  >=6.5%   Diagnostic of Diabetes Mellitus           (if abnormal result is confirmed) 5.7-6.4%   Increased risk of developing Diabetes Mellitus References:Diagnosis and Classification of Diabetes Mellitus,Diabetes WPYK,9983,38(SNKNL 1):S62-S69 and Standards of Medical Care in         Diabetes - 2011,Diabetes Care,2011,34 (Suppl 1):S11-S61.    CBG: Recent Labs  Lab 07/23/21 1624 07/24/21 0724  07/24/21 1209 07/24/21 1628 07/24/21 1820  GLUCAP 192* 275* 224* 181* 198*    Review of Systems:   Unable to obtain in setting of her encephalopathy  Past Medical History:  She,  has a past medical history of Diabetes mellitus without complication ([place]), Family history of brain cancer, Family history of breast cancer, Family history of kidney cancer, Family history of ovarian cancer, GERD (gastroesophageal reflux disease), Hyperlipidemia, Hypertension, Kidney stone, Pancreatic cancer ([place]) (02/03/2020), Shingles, and Type  2 diabetes mellitus ([place]).   Surgical History:   Past Surgical History:  Procedure Laterality Date   CESAREAN SECTION     x 4   IR IMAGING GUIDED PORT INSERTION  02/18/2020   KIDNEY STONE SURGERY       Social History:   reports that she has never smoked. She has never used smokeless tobacco. She reports that she does not drink alcohol and does not use drugs.   Family History:  Her family history includes Brain cancer (age of onset: 72) in her paternal uncle; Breast cancer in her cousin and paternal aunt; Cancer in her cousin; Heart attack in her maternal uncle; Kidney cancer in her cousin; Lung cancer in her cousin; Ovarian cancer in her cousin and cousin.   Allergies Allergies  Allergen Reactions   Ivp Dye [Iodinated Diagnostic Agents] Itching and Rash   Morphine And Related Hives   Penicillins Rash     Home Medications  Prior to Admission medications   Medication Sig Start Date End Date Taking? Authorizing Provider  albuterol (PROVENTIL HFA;VENTOLIN HFA) 108 (90 Base) MCG/ACT inhaler Inhale 2 puffs into the lungs every 4 (four) hours as needed for wheezing or shortness of breath. 04/01/16  Yes Mabe, Shanon Brow, NP  dicyclomine (BENTYL) 10 MG capsule Take 10 mg by mouth daily as needed for spasms. 06/03/21  Yes [provider]  diphenoxylate-atropine (LOMOTIL) 2.5-0.025 MG tablet Take 2 tablets by mouth 4 (four) times daily as needed for diarrhea or loose  stools. 06/23/21  Yes Volanda Napoleon, MD  fentaNYL (DURAGESIC) 75 MCG/HR Place 2 patches onto the skin every other day. Patient taking differently: Place 1-2 patches onto the skin every other day. 07/05/21  Yes Ennever, Rudell Cobb, MD  gabapentin (NEURONTIN) 300 MG capsule TAKE 2 CAPSULES BY MOUTH IN THE DAYTIME AND 2 CAPSULES AT BEDTIME Patient taking differently: Take 600 mg by mouth 3 (three) times daily. 12/08/20 12/08/21 Yes Ennever, Rudell Cobb, MD  HYDROcodone bit-homatropine (HYCODAN) 5-1.5 MG/5ML syrup Take 5 mLs by mouth every 6 (six) hours as needed for cough. 03/04/21  Yes Ennever, Rudell Cobb, MD  HYDROmorphone (DILAUDID) 4 MG tablet Take 1 tablet (4 mg total) by mouth every 4 (four) hours as needed for severe pain. 06/23/21  Yes Volanda Napoleon, MD  Lactulose 20 GM/30ML SOLN Take 30 mLs (20 g total) by mouth 3 (three) times daily as needed. Patient taking differently: Take 30 mLs by mouth 3 (three) times daily as needed (for constipation). 05/07/21  Yes Volanda Napoleon, MD  lidocaine-prilocaine (EMLA) cream APPLY TO PORT 1 HOUR BEFORE ACCESS. Patient taking differently: Apply 1 application topically See admin instructions. Apply to port 1 hour before access 02/17/21 02/17/22 Yes Ennever, Rudell Cobb, MD  LORazepam (ATIVAN) 0.5 MG tablet Take 0.5 mg every six hours as needed for nausea. 06/25/21  Yes Ennever, Rudell Cobb, MD  OLANZapine (ZYPREXA) 10 MG tablet Take 1 tablet (10 mg total) by mouth at bedtime. Take every day for nausea/vomiting 06/07/21  Yes Ennever, Rudell Cobb, MD  omeprazole (PRILOSEC) 40 MG capsule Take 40 mg by mouth daily as needed (acid reflux). 04/22/21  Yes [provider]  ondansetron (ZOFRAN) 4 MG tablet Take 1 tablet (4 mg total) by mouth every 6 (six) hours as needed for nausea. 07/13/21  Yes Celso Amy, NP  rivaroxaban (XARELTO) 20 MG TABS tablet Take 1 tablet (20 mg total) by mouth daily with supper. 05/12/21  Yes Volanda Napoleon, MD  amLODipine ([place])  5 MG tablet Take 5 mg  by mouth daily. Patient not taking: No sig reported 06/17/21   [provider]  cetirizine (ZYRTEC) 10 MG tablet Take 1 tablet (10 mg total) by mouth daily. Patient not taking: Reported on 06/24/2021 04/06/20   Jacelyn Pi, Lilia Argue, MD  dexamethasone (DECADRON) 4 MG tablet Take 1 tablet (4 mg total) by mouth 2 (two) times daily with a meal. For three (3) days following chemotherapy. 03/26/21   Volanda Napoleon, MD  dronabinol (MARINOL) 2.5 MG capsule Take 1 capsule (2.5 mg total) by mouth 2 (two) times daily before lunch and supper. 06/23/21   Volanda Napoleon, MD  ergocalciferol (VITAMIN D2) 1.25 MG (50000 UT) capsule Take 1 capsule (50,000 Units total) by mouth once a week. Patient not taking: Reported on 07/18/2021 10/07/20   Just, Laurita Quint, FNP  lisinopril (ZESTRIL) 10 MG tablet Take 10 mg by mouth daily. Patient not taking: Reported on 07/14/2021 06/17/21   [provider]  morphine (ROXANOL) 20 MG/ML concentrated solution Take 1 mL (20 mg total) by mouth every 3 (three) hours as needed for severe pain. 07/09/21   Volanda Napoleon, MD  polyethylene glycol (MIRALAX / GLYCOLAX) 17 g packet Take 17 g by mouth daily as needed. Patient not taking: No sig reported 02/18/20   Alma Friendly, MD  prochlorperazine (COMPAZINE) 10 MG tablet Take 1 tablet (10 mg total) by mouth every 6 (six) hours as needed for nausea or vomiting. 05/27/21   Volanda Napoleon, MD  prochlorperazine (COMPAZINE) 25 MG suppository Unwrap and place 1 suppository (25 mg total) rectally every 8 (eight) hours as needed for nausea or vomiting. 07/05/21   Volanda Napoleon, MD  morphine 20 MG SUPP Place 1 suppository (20 mg total) rectally every 4 (four) hours as needed. 07/05/21   Volanda Napoleon, MD     Critical care time: 40 minutes

## 2021-07-24 NOTE — Progress Notes (Signed)
ANTICOAGULATION CONSULT NOTE - Initial Consult  Pharmacy Consult for IV heparin Indication: right IJ thrombus   Allergies  Allergen Reactions   Ivp Dye [Iodinated Diagnostic Agents] Itching and Rash   Morphine And Related Hives   Penicillins Rash    Patient Measurements: Height: 5\' 6"  (167.6 cm) Weight: 95.7 kg (210 lb 15.7 oz) IBW/kg (Calculated) : 59.3 Heparin Dosing Weight: 80.6 kg  Vital Signs: Temp: 100.1 F (37.8 C) (11/05 1800) Temp Source: Oral (11/05 1800) BP: 134/91 (11/05 1800) Pulse Rate: 147 (11/05 1800)  Labs: Recent Labs    07/23/21 0345 07/24/21 0459 07/24/21 0948  HGB 10.7* 11.4*  --   HCT 34.7* 36.9  --   PLT 252 311  --   CREATININE 0.46  --  0.66    Estimated Creatinine Clearance: 94.9 mL/min (by C-G formula based on SCr of 0.66 mg/dL).   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 CT A/P today revealed portal venous gas, distended colon, concern for bowel ischemia. Patient made NPO. Pharmacy consulted for IV heparin dosing while Xarelto on hold. Patient had previously been on Xarelto 20mg  PO daily, but had recently been reduced to 10mg  PO daily on 07/21/21 by Oncology. Last dose of Xarelto 10mg  PO was 07/23/21 at 1639. Note that recent Xarelto use can falsely elevate heparin level.    Goal of Therapy:  Heparin level 0.3-0.7 units/ml aPTT 66-102 seconds Monitor platelets by anticoagulation protocol: Yes    Plan:  Check baseline heparin level and aPTT Start heparin infusion at 1300 units/hr (no initial bolus due to recent Xarelto) Likely will need to monitor/titrate heparin infusion using aPTT for now given recent Xarelto use until heparin level and aPTT correlate and thus effects of DOAC have diminished aPTT 6 hours after initiation Daily CBC, heparin level, aPTT Monitor closely for s/sx of bleeding    Lindell Spar, PharmD, BCPS Clinical Pharmacist  07/24/2021,8:05 PM

## 2021-07-25 DIAGNOSIS — C259 Malignant neoplasm of pancreas, unspecified: Secondary | ICD-10-CM | POA: Diagnosis not present

## 2021-07-25 DIAGNOSIS — Z7189 Other specified counseling: Secondary | ICD-10-CM | POA: Diagnosis not present

## 2021-07-25 DIAGNOSIS — J9601 Acute respiratory failure with hypoxia: Secondary | ICD-10-CM | POA: Diagnosis not present

## 2021-07-25 DIAGNOSIS — Z515 Encounter for palliative care: Secondary | ICD-10-CM | POA: Diagnosis not present

## 2021-07-25 LAB — CBC
HCT: 31 % — ABNORMAL LOW (ref 36.0–46.0)
Hemoglobin: 9.6 g/dL — ABNORMAL LOW (ref 12.0–15.0)
MCH: 26.3 pg (ref 26.0–34.0)
MCHC: 31 g/dL (ref 30.0–36.0)
MCV: 84.9 fL (ref 80.0–100.0)
Platelets: 283 10*3/uL (ref 150–400)
RBC: 3.65 MIL/uL — ABNORMAL LOW (ref 3.87–5.11)
RDW: 18.8 % — ABNORMAL HIGH (ref 11.5–15.5)
WBC: 2.1 10*3/uL — ABNORMAL LOW (ref 4.0–10.5)
nRBC: 0 % (ref 0.0–0.2)

## 2021-07-25 LAB — LACTIC ACID, PLASMA
Lactic Acid, Venous: 1.4 mmol/L (ref 0.5–1.9)
Lactic Acid, Venous: 1.4 mmol/L (ref 0.5–1.9)

## 2021-07-25 LAB — COMPREHENSIVE METABOLIC PANEL
ALT: 16 U/L (ref 0–44)
AST: 15 U/L (ref 15–41)
Albumin: 2.2 g/dL — ABNORMAL LOW (ref 3.5–5.0)
Alkaline Phosphatase: 63 U/L (ref 38–126)
Anion gap: 12 (ref 5–15)
BUN: 49 mg/dL — ABNORMAL HIGH (ref 6–20)
CO2: 19 mmol/L — ABNORMAL LOW (ref 22–32)
Calcium: 7.8 mg/dL — ABNORMAL LOW (ref 8.9–10.3)
Chloride: 100 mmol/L (ref 98–111)
Creatinine, Ser: 0.61 mg/dL (ref 0.44–1.00)
GFR, Estimated: >60 mL/min (ref >60)
Glucose, Bld: 260 mg/dL — ABNORMAL HIGH (ref 70–99)
Potassium: 3.7 mmol/L (ref 3.5–5.1)
Sodium: 131 mmol/L — ABNORMAL LOW (ref 135–145)
Total Bilirubin: 1.2 mg/dL (ref 0.3–1.2)
Total Protein: 5.7 g/dL — ABNORMAL LOW (ref 6.5–8.1)

## 2021-07-25 LAB — HEPARIN LEVEL (UNFRACTIONATED)
Heparin Unfractionated: 0.77 IU/mL — ABNORMAL HIGH (ref 0.30–0.70)
Heparin Unfractionated: 0.47 IU/mL (ref 0.30–0.70)

## 2021-07-25 LAB — APTT: aPTT: 108 seconds — ABNORMAL HIGH (ref 24–36)

## 2021-07-25 LAB — GLUCOSE, CAPILLARY
Glucose-Capillary: 241 mg/dL — ABNORMAL HIGH (ref 70–99)
Glucose-Capillary: 209 mg/dL — ABNORMAL HIGH (ref 70–99)
Glucose-Capillary: 198 mg/dL — ABNORMAL HIGH (ref 70–99)
Glucose-Capillary: 188 mg/dL — ABNORMAL HIGH (ref 70–99)
Glucose-Capillary: 216 mg/dL — ABNORMAL HIGH (ref 70–99)

## 2021-07-25 LAB — URINALYSIS, ROUTINE W REFLEX MICROSCOPIC
Glucose, UA: NEGATIVE mg/dL
Hgb urine dipstick: NEGATIVE
Ketones, ur: 5 mg/dL — AB
Nitrite: NEGATIVE
Protein, ur: NEGATIVE mg/dL
Specific Gravity, Urine: 1.028 (ref 1.005–1.030)
pH: 5 (ref 5.0–8.0)

## 2021-07-25 MED ORDER — FENTANYL 75 MCG/HR TD PT72
1.0000 | MEDICATED_PATCH | TRANSDERMAL | Status: DC
  Administered 2021-07-25 – 2021-08-03 (×4): 1 via TRANSDERMAL
  Filled 2021-07-25 (×4): qty 1

## 2021-07-25 NOTE — Progress Notes (Signed)
Unfortunately, things are going the wrong way.  She is now down in the stepdown unit.  I had a long talk with her daughter this morning.  I have tried to explain to the daughter that this is all from her cancer and not from medications.  Her intestine is starting to shut down because of the cancer.  Even if the CT scan does not show progressive cancer, the fact that she has the cancer there is progression, I am convinced, and that the intestinal nervous system is being affected.  There is nothing that we can do for this.  I hate that she has an NG tube in.  The fact that she is not moving around again I suspect is all from her malignancy and that her body is wearing out.  I remember a note a few days ago that the nurses try to get her up and she was total assistance.  Again I think this is all indicative of the cancer starting to negatively affect her body and that her body is just not able to withstand the cancer.  I just want Angela Wilson to be comfortable.  If her daughter does not want to have pain medication, that is certainly her prerogative and I am okay with this.  I know this is cultural in many ways.  Again, this is just what pancreatic cancer does.  It just continues to progress even if we do not see it on the scans.  The fact that there is the peritoneal disease, there could certainly be liver metastasis now.  She has a large pleural effusion which she has had before.  She has a Foley catheter in.  Her bladder is not emptying.  She is on a heparin infusion now.  She got a dose of albumin.  There are no labs that are back yet.  I just hate that we are seeing this happen to Angela Wilson.  I doubt that she will leave the hospital.  I hope that she is not going to have a Code and then end up on a ventilator.  I think the ileus is just indicative of her body beginning to set itself down.  I really would not favor any type of TPN.  I just do not see how this is going to benefit  her.  I have known Angela Wilson in her family for a year and a half.  We have tried every chemotherapy protocol that we have.  She is not able and not a candidate for any additional chemotherapy.  I know her daughter has faith.  I respect that.  However, I just have to try to be realistic about what we are seeing and what is going to happen.  We will try our best to improve the current status quo.  I just have not aware that we have any medication or intervention to do this.  I know that she is aware she needs to be right now as the ICU/stepdown staff are really good and will really do their best.  Lattie Haw, MD  2 Chronicles 20:12

## 2021-07-25 NOTE — Progress Notes (Signed)
PROGRESS NOTE    Angela Wilson  XHB:[id number] DOB: 08/26/1966 DOA: 06/29/2021 PCP: Just, Laurita Quint, FNP (Inactive)   Brief Narrative: 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 She has undergone extensive treatment since initial diagnosis in May 2021.  She is followed outpatient by Dr. Marin Olp. Despite treatment, her cancer seems to have progressed and lately she does not appear to be tolerating treatment from a physical ability standpoint.  Her appetite has decreased and she has developed generalized edema with recurrent abdominal ascites and now with enlarging right pleural effusion seen on imaging.  Due to religious beliefs, patient and family continue to want full scope of care without consideration of scaling back treatment in efforts to focus on comfort.  Assessment & Plan:   Principal Problem:   Acute respiratory failure with hypoxia (HCC) Active Problems:   Type 2 diabetes mellitus (HCC)   Stage IV adenocarcinoma of pancreas (HCC)   Essential hypertension, benign   Abdominal ascites   Pleural effusion on right   Hyponatremia   Internal jugular (IJ) vein thromboembolism, chronic, right (HCC)   Goals of care, counseling/discussion   Intravascular volume depletion  #1 stage IV adenocarcinoma of the pancreas diagnosed in May 2021 with mets to the peritoneum -patient has had multiple rounds of chemotherapy.  She is not a candidate for any chemotherapy at this time.  She was seen by Dr. Marin Olp.  Palliative care was consulted however due to religious believes patient's daughter believes that she is going to be healed.  We will continue discussions with the patient's daughter.  #2 acute hypoxic respiratory failure due to large right pleural effusion patient appears comfortable without any respiratory  distress at this time.  Thoracentesis was not done due to this reason. She is 93% on room air. Chest x-ray 07/24/2021 with bilateral small to moderate pleural effusion.  May need thoracentesis in the near future.  #3 ascites patient was taken down to IR for possible paracentesis however she had very minimal amount of ascites fluid which was in close proximity to bowels so this was not attempted.  #4 goals of care patient remains a full code with very poor prognosis.  #5 tachycardia-multifactorial I have started her on low-dose Lopressor.  Blood pressure stable.  Echo with normal ejection fraction.  #6 right IJ thrombus on Xarelto  #7  Fever temperature curve coming down no evidence of infection noted so far.  This is likely tumor fever.  Continue Toradol.  #8 colonic ileus-I will make her n.p.o. place her on normal saline at 75 cc an hour.  Try to minimize narcotics. I have stopped most of her medications that can cause her sleepy/drowsy.  I stopped the Zyprexa Remeron I have DC'd her Marinol as she was not taking it p.o. and has having nausea and vomiting I have changed her Ativan to IV hoping this will help her nausea and vomiting She is also getting Zofran, Compazine, Emend  Follow-up KUB in a.m. Unfortunately she is not able to ambulate or move much due to generalized deconditioning weakness and limitation due to tachycardia Estimated body mass index is 33.23 kg/m as calculated from the following:   Height as of this encounter: 5\' 6"  (1.676 m).   Weight as of this encounter: 93.4 kg.  DVT prophylaxis: Xarelto  code Status: Full code Family Communication: Discussed with daughter  at bedside disposition Plan:  Status is: Inpatient  Remains inpatient appropriate because: Tachycardic with stage IV pancreatic CA with mets  Consultants:  Oncology  Procedures: Paracentesis Antimicrobials: None  Subjective: Daughter by the bedside patient had a bad night due to diarrhea nausea and  vomiting. Stat KUB shows colonic ileus Chest x-ray shows small to moderate bilateral pleural effusion Albumin 2.0  Objective: Vitals:   07/25/21 0349 07/25/21 0400 07/25/21 0500 07/25/21 0600  BP:  (!) 148/74 119/66 (!) 154/65  Pulse:  (!) 138 (!) 139 (!) 141  Resp:  (!) 31 (!) 30 (!) 32  Temp: 98.6 F (37 C)     TempSrc: Axillary     SpO2:  96% 95% 96%  Weight:   93.4 kg   Height:        Intake/Output Summary (Last 24 hours) at 07/25/2021 0651 Last data filed at 07/25/2021 0500 Gross per 24 hour  Intake 1480.97 ml  Output 562 ml  Net 918.97 ml    Filed Weights   07/22/21 0600 07/23/21 0447 07/25/21 0500  Weight: 94.8 kg 95.7 kg 93.4 kg    Examination: Tachycardic  General exam: Appears uncomfortable and ill appearing Respiratory system: Diminished the bases to auscultation. Respiratory effort normal. Cardiovascular system: S1 & S2 heard, RRR. No JVD, murmurs, rubs, gallops or clicks.  Gastrointestinal system: Abdomen is distended, nontender. No organomegaly or masses felt. No bowel sounds heard. Central nervous system: Alert and oriented. No focal neurological deficits. Extremities: Symmetric 5 x 5 power. Skin: No rashes, lesions or ulcers Psychiatry: Unable to assess  Data Reviewed: I have personally reviewed following labs and imaging studies  CBC: Recent Labs  Lab 07/19/21 0408 07/20/21 0334 07/21/21 0514 07/23/21 0345 07/24/21 0459  WBC 3.8* 3.5* 4.0 3.2* 2.0*  NEUTROABS 2.7 2.3 2.7 2.1  --   HGB 10.2* 10.2* 10.2* 10.7* 11.4*  HCT 33.1* 33.4* 34.3* 34.7* 36.9  MCV 86.0 88.4 87.5 86.1 84.8  PLT 145* 144* 182 252 294    Basic Metabolic Panel: Recent Labs  Lab 07/19/21 0408 07/20/21 0334 07/21/21 0514 07/23/21 0345 07/24/21 0948  NA 131* 133* 132* 133* 130*  K 3.8 3.8 3.8 3.9 4.1  CL 97* 98 98 98 97*  CO2 27 29 29 25 22   GLUCOSE 239* 241* 261* 246* 302*  BUN 14 14 17  31* 44*  CREATININE 0.46 0.37* 0.41* 0.46 0.66  CALCIUM 7.5* 7.7* 7.5* 7.9*  8.0*  MG 1.8 1.9 1.9  --   --     GFR: Estimated Creatinine Clearance: 93.6 mL/min (by C-G formula based on SCr of 0.66 mg/dL). Liver Function Tests: Recent Labs  Lab 07/19/21 0408 07/20/21 0334 07/21/21 0514 07/23/21 0345 07/24/21 0948  AST 18 27 27 24  14*  ALT 16 22 27 29 20   ALKPHOS 43 45 47 57 65  BILITOT 0.7 0.8 0.8 0.7 0.9  PROT 5.3* 5.4* 5.4* 5.6* 5.9*  ALBUMIN 1.8* 2.0* 2.0* 2.0* 2.0*    No results for input(s): LIPASE, AMYLASE in the last 168 hours. No results for input(s): AMMONIA in the last 168 hours. Coagulation Profile: No results for input(s): INR, PROTIME in the last 168 hours.  Cardiac Enzymes: No results for input(s): CKTOTAL, CKMB, CKMBINDEX, TROPONINI in the last 168 hours. BNP (last 3 results) No results for input(s): PROBNP in the last 8760 hours. HbA1C: No results for input(s): HGBA1C in the last 72 hours. CBG: Recent Labs  Lab 07/24/21 0724 07/24/21 1209 07/24/21 1628 07/24/21 1820 07/24/21 2121  GLUCAP 275* 224* 181* 198* 95    Lipid Profile: No results for input(s): CHOL, HDL, LDLCALC, TRIG, CHOLHDL, LDLDIRECT in the last 72 hours. Thyroid Function Tests: No results for input(s): TSH, T4TOTAL, FREET4, T3FREE, THYROIDAB in the last 72 hours. Anemia Panel: No results for input(s): VITAMINB12, FOLATE, FERRITIN, TIBC, IRON, RETICCTPCT in the last 72 hours. Sepsis Labs: Recent Labs  Lab 07/24/21 1949 07/24/21 2300 07/25/21 0200  LATICACIDVEN 1.7 1.4 1.4     Recent Results (from the past 240 hour(s))  Culture, blood (Routine x 2)     Status: None   Collection Time: 07/13/2021  1:45 PM   Specimen: Chest; Blood  Result Value Ref Range Status   Specimen Description   Final    CHEST RIGHT PORTA CATH Performed at [facility], [address] [place], [place] [address]    Special Requests   Final    BOTTLES DRAWN AEROBIC AND ANAEROBIC Blood Culture adequate volume Performed at [facility], [address]  [place], [place] [address]    Culture   Final    NO GROWTH 5 DAYS Performed at [facility], [place] [address], [place], [place] [address]    Report Status 07/20/2021 FINAL  Final  Culture, blood (Routine x 2)     Status: None   Collection Time: 07/02/2021  2:00 PM   Specimen: BLOOD RIGHT HAND  Result Value Ref Range Status   Specimen Description   Final    BLOOD RIGHT HAND Performed at [facility], [address] [place], [place] [address]    Special Requests   Final    BOTTLES DRAWN AEROBIC AND ANAEROBIC Blood Culture adequate volume Performed at [facility], [address] [place], [place] [address]    Culture   Final    NO GROWTH 5 DAYS Performed at [facility], [place] [address], [place], [place] [address]    Report Status 07/20/2021 FINAL  Final  Resp Panel by RT-PCR (Flu A&B, Covid) Nasopharyngeal Swab     Status: None   Collection Time: 07/07/2021  3:44 PM   Specimen: Nasopharyngeal Swab; Nasopharyngeal(NP) swabs in vial transport medium  Result Value Ref Range Status   SARS Coronavirus 2 by RT PCR NEGATIVE NEGATIVE Final    Comment: (NOTE) SARS-CoV-2 target nucleic acids are NOT DETECTED.  The SARS-CoV-2 RNA is generally detectable in upper respiratory specimens during the acute phase of infection. The lowest concentration of SARS-CoV-2 viral copies this assay can detect is 138 copies/mL. A negative result does not preclude SARS-Cov-2 infection and should not be used as the sole basis for treatment or other patient management decisions. A negative result may occur with  improper specimen collection/handling, submission of specimen other than nasopharyngeal swab, presence of viral mutation(s) within the areas targeted by this assay, and inadequate number of viral copies(<138 copies/mL). A negative result must be combined with clinical observations, patient history, and epidemiological information. The expected result is Negative.  Fact Sheet  for Patients:  EntrepreneurPulse.com.au  Fact Sheet for Healthcare Providers:  IncredibleEmployment.be  This test is no t yet approved or cleared by the [place] FDA and  has been authorized for detection and/or diagnosis of SARS-CoV-2 by FDA under an Emergency Use Authorization (EUA). This EUA will remain  in effect (meaning this test can be used) for the duration of the COVID-19 declaration under Section 564(b)(1) of the Act, 21 U.S.C.section 360bbb-3(b)(1), unless  the authorization is terminated  or revoked sooner.       Influenza A by PCR NEGATIVE NEGATIVE Final   Influenza B by PCR NEGATIVE NEGATIVE Final    Comment: (NOTE) The Xpert Xpress SARS-CoV-2/FLU/RSV plus assay is intended as an aid in the diagnosis of influenza from Nasopharyngeal swab specimens and should not be used as a sole basis for treatment. Nasal washings and aspirates are unacceptable for Xpert Xpress SARS-CoV-2/FLU/RSV testing.  Fact Sheet for Patients: EntrepreneurPulse.com.au  Fact Sheet for Healthcare Providers: IncredibleEmployment.be  This test is not yet approved or cleared by the [place] FDA and has been authorized for detection and/or diagnosis of SARS-CoV-2 by FDA under an Emergency Use Authorization (EUA). This EUA will remain in effect (meaning this test can be used) for the duration of the COVID-19 declaration under Section 564(b)(1) of the Act, 21 U.S.C. section 360bbb-3(b)(1), unless the authorization is terminated or revoked.  Performed at [facility], [address], [place], [place] [address]   Urine Culture     Status: Abnormal   Collection Time: 07/20/21 12:29 PM   Specimen: Urine, Catheterized  Result Value Ref Range Status   Specimen Description   Final    URINE, CATHETERIZED Performed at [facility] [address] [place], [place] [address]    Special  Requests   Final    Immunocompromised Performed at [facility], [place] [address], [place], [place] [address]    Culture (A)  Final    <10,000 COLONIES/mL INSIGNIFICANT GROWTH Performed at [place] [address] [place],  [address]    Report Status 07/21/2021 FINAL  Final  MRSA Next Gen by PCR, Nasal     Status: Abnormal   Collection Time: 07/24/21  5:58 PM   Specimen: Nasal Mucosa; Nasal Swab  Result Value Ref Range Status   MRSA by PCR Next Gen DETECTED (A) NOT DETECTED Final    Comment: RESULT CALLED TO, READ BACK BY AND VERIFIED WITH: CALDERON,E RN @2141  ON 07/24/21 JACKSON,K (NOTE) The GeneXpert MRSA Assay (FDA approved for NASAL specimens only), is one component of a comprehensive MRSA colonization surveillance program. It is not intended to diagnose MRSA infection nor to guide or monitor treatment for MRSA infections. Test performance is not FDA approved in patients less than 8 years old. Performed at [facility], [place] [address], [place],  [address]           Radiology Studies: CT ABDOMEN PELVIS WO CONTRAST  Result Date: 07/24/2021 CLINICAL DATA:  Abdominal distension. Metastatic pancreatic carcinoma. IV contrast allergy. Vomiting and diarrhea. EXAM: CT ABDOMEN AND PELVIS WITHOUT CONTRAST TECHNIQUE: Multidetector CT imaging of the abdomen and pelvis was performed following the standard protocol without IV contrast. COMPARISON:  06/28/2021 FINDINGS: Lower chest: Atelectasis in the left lower lobe, right middle lobe, and right lower lobe. Large right pleural effusion. Small stable pericardial effusion. Central catheter tip terminates in the right atrium. Anterior pericardial node 0.6 cm in short axis on image 8 series 2. Hepatobiliary: Heterogeneity in the liver probably reflecting metastatic lesions. Nonvisualization of the gallbladder, presumed surgically absent. Pancreas: Solid mass of the pancreatic tail 4.6  by 2.9 cm, roughly stable. Spleen: Unremarkable Adrenals/Urinary Tract: Punctate bilateral nonobstructive renal calculi. Adrenal glands unremarkable. Stomach/Bowel: Frothy fluid levels in the distal colon and rectum with diarrheal process. Dilated colon down to the level of the sigmoid, there is some adjacent nodularity but a definite cause for the  transition is not observed no substantially dilated small bowel currently. Vascular/Lymphatic: Patency not assessed due to lack of IV contrast. No periaortic adenopathy is identified. There appears to be a trace amount of gas in a likely venous structure tracking along the anterior omentum on image 41 series 2, this could be related to gas from an IV as I do not demonstrate other classic hallmarks of portal venous gas along more characteristic locations for example in the mesenteric bowel. Reproductive: Uterus appears mildly flattened anteriorly, with large conglomerate pelvic cystic mass involving both adnexa measuring about 15.6 by 11.6 cm on image 66 of series 2, roughly similar to prior. Other: Substantial ascites similar to prior with mesenteric edema and extensive scattered nodularity along the mesentery and omentum compatible peritoneal tumor. 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 IMPRESSION: 1. Trace gas in tiny venous structures to the left of the stomach and also along the anterior omentum. Although not a typical distribution for portal venous gas, there is also dilated colon up to the level of the sigmoid colon (without visible pneumatosis). Correlate with lactate levels in assessing for the possibility of ischemic bowel. 2. Generally similar appearance of large right effusion, substantial ascites, extensive peritoneal metastatic disease, large cystic pelvic masses, pancreatic tail tumor, and some heterogeneity in the liver suspicious for hepatic metastatic lesions. 3. Bilateral punctate nonobstructive  nephrolithiasis. 4. Atelectasis in the left lower lobe, right middle lobe, and right lower lobe. 5. Stable small pericardial effusion. Critical Value/emergent results were called by telephone at the time of interpretation on 07/24/2021 at 6:45 pm to provider Dr. Verlee Monte, who verbally acknowledged these results. Electronically Signed   By: Van Clines M.D.   On: 07/24/2021 18:46   DG Chest 1 View  Result Date: 07/24/2021 CLINICAL DATA:  Abdominal pain, nausea and vomiting, pancreatic cancer EXAM: CHEST  1 VIEW COMPARISON:  06/20/2021 chest radiograph. FINDINGS: Right internal jugular Port-A-Cath terminates over the right atrium, stable. Stable cardiomediastinal silhouette with normal heart size. No pneumothorax. Small to moderate right and small left pleural effusions, slightly increased. No overt pulmonary edema. Hazy bibasilar lung opacities, mildly worsened, favor atelectasis. IMPRESSION: Small to moderate right and small left pleural effusions, slightly increased. Hazy bibasilar lung opacities, mildly worsened, favor atelectasis. Electronically Signed   By: Ilona Sorrel M.D.   On: 07/24/2021 11:26   DG Abd 1 View  Result Date: 07/25/2021 CLINICAL DATA:  Check gastric catheter placement EXAM: ABDOMEN - 1 VIEW COMPARISON:  None. FINDINGS: Gastric catheter is noted within the stomach. Scattered large and small bowel gas is noted similar to that seen on the prior exam. No free air is seen. IMPRESSION: Gastric catheter in the stomach. Findings suggestive of ileus. Electronically Signed   By: Inez Catalina M.D.   On: 07/25/2021 00:00   DG Abd 1 View  Result Date: 07/24/2021 CLINICAL DATA:  Abdominal pain, nausea and vomiting EXAM: ABDOMEN - 1 VIEW COMPARISON:  07/21/2021 abdominal radiographs FINDINGS: No disproportionately dilated small bowel loops. Prominent gaseous distension of the colon, most prominent in the transverse colon, worsened. No evidence of pneumatosis or pneumoperitoneum. No radiopaque  nephrolithiasis. IMPRESSION: Prominent gaseous distension of the colon, most prominent in the transverse colon, worsened. Findings are suggestive of colonic ileus. No disproportionately dilated small bowel loops to suggest small bowel obstruction. Electronically Signed   By: Ilona Sorrel M.D.   On: 07/24/2021 11:25   ECHOCARDIOGRAM LIMITED  Result Date: 07/23/2021    ECHOCARDIOGRAM  LIMITED REPORT   Patient Name:   REINA WILTON Date of Exam: 07/23/2021 Medical Rec #:  [id number]         Height:       66.0 in Accession #:    [id number]        Weight:       211.0 lb Date of Birth:  1966-05-27          BSA:          2.046 m Patient Age:    11 years          BP:           127/83 mmHg Patient Gender: F                 HR:           113 bpm. Exam Location:  Inpatient Procedure: Limited Echo, Color Doppler and Cardiac Doppler Indications:    R94.31 Abnormal EKG  History:        Patient has no prior history of Echocardiogram examinations.                 Risk Factors:Hypertension, Diabetes, Dyslipidemia and Stage IV                 Pancreatic Cancer.  Sonographer:    Raquel Sarna Senior RDCS Referring Phys: [id number] Noland Fordyce Elyanna Wallick  Sonographer Comments: Technically difficult study due to poor echo windows, suboptimal parasternal window, suboptimal apical window and suboptimal subcostal window. IMPRESSIONS  1. Limited study with very difficult windows for TTE. Grossly LV function appears preserved 60-65% but cannot assess for WMA. RV not well visualized.  2. Left ventricular ejection fraction, by estimation, is 60 to 65%. The left ventricle has normal function. Left ventricular endocardial border not optimally defined to evaluate regional wall motion.  3. Right ventricular systolic function was not well visualized. The right ventricular size is not well visualized. There is normal pulmonary artery systolic pressure.  4. The mitral valve was not well visualized.  5. The aortic valve was not well visualized.  6. The inferior  vena cava is normal in size with <50% respiratory variability, suggesting right atrial pressure of 8 mmHg. FINDINGS  Left Ventricle: Left ventricular ejection fraction, by estimation, is 60 to 65%. The left ventricle has normal function. Left ventricular endocardial border not optimally defined to evaluate regional wall motion. Right Ventricle: The right ventricular size is not well visualized. Right vetricular wall thickness was not well visualized. Right ventricular systolic function was not well visualized. There is normal pulmonary artery systolic pressure. The tricuspid regurgitant velocity is 2.57 m/s, and with an assumed right atrial pressure of 8 mmHg, the estimated right ventricular systolic pressure is 30.1 mmHg. Left Atrium: Left atrial size was not well visualized. Right Atrium: Right atrial size was not well visualized. Pericardium: There is no evidence of pericardial effusion. Presence of pericardial fat pad. Mitral Valve: The mitral valve was not well visualized. Tricuspid Valve: The tricuspid valve is grossly normal. Tricuspid valve regurgitation is mild . No evidence of tricuspid stenosis. Aortic Valve: The aortic valve was not well visualized. Pulmonic Valve: The pulmonic valve was not well visualized. Pulmonic valve regurgitation is not visualized. No evidence of pulmonic stenosis. Aorta: The aortic root is normal in size and structure. Venous: The inferior vena cava is normal in size with less than 50% respiratory variability, suggesting right atrial pressure of 8 mmHg. AORTIC VALVE LVOT Vmax:   87.40 cm/s LVOT Vmean:  57.400 cm/s LVOT VTI:    0.127 m TRICUSPID VALVE TR Peak grad:   26.4 mmHg TR Vmax:        257.00 cm/s  SHUNTS Systemic VTI: 0.13 m Eleonore Chiquito MD Electronically signed by Eleonore Chiquito MD Signature Date/Time: 07/23/2021/1:48:15 PM    Final         Scheduled Meds:  chlorhexidine  15 mL Mouth Rinse BID   Chlorhexidine Gluconate Cloth  6 each Topical Daily   feeding  supplement  237 mL Oral BID BM   insulin aspart  0-9 Units Subcutaneous TID WC   mouth rinse  15 mL Mouth Rinse q12n4p   mupirocin ointment  1 application Nasal BID   pantoprazole (PROTONIX) IV  40 mg Intravenous Q12H   scopolamine  1 patch Transdermal Q72H   sodium chloride flush  3 mL Intravenous Q12H   Continuous Infusions:  albumin human 25 g (07/25/21 0349)   ceFEPime (MAXIPIME) IV 200 mL/hr at 07/25/21 0242   heparin 1,300 Units/hr (07/25/21 0242)   metronidazole Stopped (07/24/21 2230)   promethazine (PHENERGAN) injection (IM or IVPB) 12.5 mg (07/24/21 1511)     LOS: 10 days    Georgette Shell, MD 07/25/2021, 6:51 AM

## 2021-07-25 NOTE — Progress Notes (Signed)
[facility] for IV heparin Indication: right IJ thrombus   Allergies  Allergen Reactions   Ivp Dye [Iodinated Diagnostic Agents] Itching and Rash   Morphine And Related Hives   Penicillins Rash    Patient Measurements: Height: 5\' 6"  (167.6 cm) Weight: 93.4 kg (205 lb 14.6 oz) IBW/kg (Calculated) : 59.3 Heparin Dosing Weight: 80.6 kg  Vital Signs: Temp: 100.2 F (37.9 C) (11/06 0700) Temp Source: Axillary (11/06 0349) BP: 138/61 (11/06 0700) Pulse Rate: 140 (11/06 0700)  Labs: Recent Labs    07/23/21 0345 07/24/21 0459 07/24/21 0948 07/24/21 2130 07/25/21 0440  HGB 10.7* 11.4*  --   --   --   HCT 34.7* 36.9  --   --   --   PLT 252 311  --   --   --   APTT  --   --   --  27  --   HEPARINUNFRC  --   --   --  0.61 0.77*  CREATININE 0.46  --  0.66  --   --     Estimated Creatinine Clearance: 93.6 mL/min (by C-G formula based on SCr of 0.66 mg/dL).   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 CT A/P today revealed portal venous gas, distended colon, concern for bowel ischemia. Patient made NPO. Pharmacy consulted for IV heparin dosing while Xarelto on hold. Patient had previously been on Xarelto 20mg  PO daily, but had recently been reduced to 10mg  PO daily on 07/21/21 by Oncology. Last dose of Xarelto 10mg  PO was 07/23/21 at 1639. Note that recent Xarelto use can falsely elevate heparin level.    11/5: Baseline Heparin level 0.61, aPTT 27 sec Started heparin infusion at 1300 units/hr (no initial bolus due to recent Xarelto) Likely will need to monitor/titrate heparin infusion using aPTT for now given recent Xarelto use until heparin level and aPTT correlate and thus effects of DOAC have diminished   Goal of Therapy:  Heparin level 0.3-0.7 units/ml aPTT 66-102 seconds Monitor platelets by anticoagulation protocol: Yes  Today, 07/25/2021 0440 Hep level 0.77, aPTT 108 sec, levels correlate, will use only Heparin levels for monitoring   Plan:  Reduce Heparin infusion to 1200 units/hr Check 8 hr Heparin level Daily CBC, heparin level Monitor closely for s/sx of bleeding  Minda Ditto PharmD [facility] [phone] 07/25/2021, 7:14 AM

## 2021-07-25 NOTE — Progress Notes (Signed)
ANTICOAGULATION CONSULT NOTE   Pharmacy Consult for IV heparin Indication: right IJ thrombus   Allergies  Allergen Reactions   Ivp Dye [Iodinated Diagnostic Agents] Itching and Rash   Morphine And Related Hives   Penicillins Rash    Patient Measurements: Height: 5\' 6"  (167.6 cm) Weight: 93.4 kg (205 lb 14.6 oz) IBW/kg (Calculated) : 59.3 Heparin Dosing Weight: 80.6 kg  Vital Signs: Temp: 99.5 F (37.5 C) (11/06 1807) Temp Source: Core (Comment) (11/06 0800) BP: 131/69 (11/06 1807) Pulse Rate: 137 (11/06 1807)  Labs: Recent Labs    07/23/21 0345 07/24/21 0459 07/24/21 0948 07/24/21 2130 07/25/21 0400 07/25/21 0440 07/25/21 0832 07/25/21 1706  HGB 10.7* 11.4*  --   --   --   --  9.6*  --   HCT 34.7* 36.9  --   --   --   --  31.0*  --   PLT 252 311  --   --   --   --  283  --   APTT  --   --   --  27 108*  --   --   --   HEPARINUNFRC  --   --   --  0.61  --  0.77*  --  0.47  CREATININE 0.46  --  0.66  --   --   --  0.61  --     Estimated Creatinine Clearance: 93.6 mL/min (by C-G formula based on SCr of 0.61 mg/dL).   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 CT A/P today revealed portal venous gas, distended colon, concern for bowel ischemia. Patient made NPO. Pharmacy consulted for IV heparin dosing while Xarelto on hold. Patient had previously been on Xarelto 20mg  PO daily, but had recently been reduced to 10mg  PO daily on  07/21/21 by Oncology. Last dose of Xarelto 10mg  PO was 07/23/21 at 1639. Note that recent Xarelto use can falsely elevate heparin level.   11/5: Baseline Heparin level 0.61, aPTT 27 sec Started heparin infusion at 1300 units/hr (no initial bolus due to recent Xarelto) Likely will need to monitor/titrate heparin infusion using aPTT for now given recent Xarelto use until heparin level and aPTT correlate and thus effects of DOAC have diminished   Goal of Therapy:  Heparin level 0.3-0.7 units/ml aPTT 66-102 seconds Monitor platelets by anticoagulation protocol: Yes  Today, 07/25/2021 HL is 0.47 therapeutic  Using HL now as aPTT and HL are correlating  No line or bleeding issues per RN      Plan:  Continue  Heparin infusion to 1200 units/hr Heparin level with AM labs  Daily CBC, heparin level Monitor closely for s/sx of bleeding  Royetta Asal, PharmD, BCPS 07/25/2021 7:24 PM

## 2021-07-25 NOTE — Progress Notes (Signed)
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 She has undergone extensive treatment since initial diagnosis in May 2021.  She is followed outpatient by Dr. Marin Olp. Despite treatment, her cancer seems to have progressed and lately she does not appear to be tolerating treatment from a physical ability standpoint.  Her appetite has decreased and she has developed generalized edema with recurrent abdominal ascites and now with enlarging right pleural effusion seen on imaging.  Due to religious beliefs, patient and family continue to want full scope of care without consideration of scaling back treatment in efforts to focus on comfort.  Assessment & Plan:   Principal Problem:   Acute respiratory failure with hypoxia (HCC) Active Problems:   Type 2 diabetes mellitus (HCC)   Stage IV adenocarcinoma of pancreas (HCC)   Essential hypertension, benign   Abdominal ascites   Pleural effusion on right   Hyponatremia   Internal jugular (IJ) vein thromboembolism, chronic, right (HCC)   Goals of care, counseling/discussion   Intravascular volume depletion  #1 stage IV adenocarcinoma of the pancreas diagnosed in May 2021 with mets to the peritoneum -patient has had multiple rounds of chemotherapy.  She is not a candidate for any chemotherapy at this time.  She was seen by Dr. Marin Olp.  Palliative care was consulted however due to religious believes patient's daughter believes that she is going to be healed.  We will continue discussions with the patient's daughter. CT abdomen 07/24/2021-large right effusion substantial ascites extensive peritoneal metastatic disease large cystic pelvic masses  pancreatic tail tumor and heterogenicity in the liver suspicious for hepatic metastatic lesions atelectasis in the left lower lobe right middle lobe and right lower lobe stable small pericardial effusion.  #2 acute hypoxic respiratory failure due to large right pleural effusion we will consult IR for thoracentesis and paracentesis tomorrow.  #3 ascites patient was taken down to IR for possible paracentesis however she had very minimal amount of ascites fluid which was in close proximity to bowels so this was not attempted. Repeat scan shows increasing ascites and abdominal distention will consult IR tomorrow.  #4 goals of care patient remains a full code with very poor prognosis.  #5 tachycardia-multifactorial I have started her on low-dose Lopressor.  Blood pressure stable.  Echo with normal ejection fraction.  #6 right IJ thrombus on Xarelto  #7  Fever no evidence of infection noted so far.  This is likely tumor fever.  Continue Toradol.  On Flagyl for possible intra-abdominal infection.  #8 colonic ileus-NG tube placed overnight continue n.p.o. status and continue to minimize narcotics.  Daughter request fentanyl patch to be restarted.  I will restarted at 75 MCG every 3 days.   I have stopped most of her medications that can cause her sleepy/drowsy.  I stopped the Zyprexa Remeron I have changed her Ativan to IV hoping this will help her nausea and vomiting She is also getting Zofran, Compazine, Emend  Follow-up KUB in a.m. Unfortunately she is not able to ambulate or move much due to generalized deconditioning weakness and limitation due to tachycardia Estimated body mass index is 33.23 kg/m as calculated from the following:   Height as of this encounter: 5\' 6"  (1.676 m).   Weight  as of this encounter: 93.4 kg.  DVT prophylaxis: Xarelto  code Status: Full code Family Communication: Discussed with daughter at bedside disposition Plan:  Status is: Inpatient  Remains inpatient  appropriate because: Tachycardic with stage IV pancreatic CA with mets  Consultants:  Oncology  Procedures: Paracentesis Antimicrobials: None  Subjective: Patient transferred to stepdown unit as her clinical status deteriorated with abdominal distention colonic ileus  Stat KUB shows colonic ileus Chest x-ray shows small to moderate bilateral pleural effusion Albumin 2.0  Objective: Vitals:   07/25/21 0900 07/25/21 1000 07/25/21 1100 07/25/21 1200  BP: 134/71 135/64 134/87 136/81  Pulse: (!) 137 (!) 132 (!) 133 (!) 133  Resp: (!) 28 (!) 24 (!) 25 (!) 30  Temp: 99.9 F (37.7 C) 99.3 F (37.4 C) 99 F (37.2 C) 99.1 F (37.3 C)  TempSrc:      SpO2: 95% 94% 95% 96%  Weight:      Height:        Intake/Output Summary (Last 24 hours) at 07/25/2021 1305 Last data filed at 07/25/2021 1049 Gross per 24 hour  Intake 1903.72 ml  Output 1486 ml  Net 417.72 ml    Filed Weights   07/22/21 0600 07/23/21 0447 07/25/21 0500  Weight: 94.8 kg 95.7 kg 93.4 kg    Examination: Tachycardic  General exam: Appears uncomfortable and ill appearing Respiratory system: Diminished the bases to auscultation. Respiratory effort normal. Cardiovascular system: S1 & S2 heard, RRR. No JVD, murmurs, rubs, gallops or clicks.  Gastrointestinal system: Abdomen is distended, tender.  Hard palpable area around the umbilicus  No bowel sounds heard. Central nervous system: Drowsy  extremities: 1+ pitting edema  skin: No rashes, lesions or ulcers Psychiatry: Unable to assess  Data Reviewed: I have personally reviewed following labs and imaging studies  CBC: Recent Labs  Lab 07/19/21 0408 07/20/21 0334 07/21/21 0514 07/23/21 0345 07/24/21 0459 07/25/21 0832  WBC 3.8* 3.5* 4.0 3.2* 2.0* 2.1*  NEUTROABS 2.7 2.3 2.7 2.1  --   --   HGB 10.2* 10.2* 10.2* 10.7* 11.4* 9.6*  HCT 33.1* 33.4* 34.3* 34.7* 36.9 31.0*  MCV 86.0 88.4 87.5 86.1 84.8 84.9  PLT 145* 144* 182 252 311 299    Basic Metabolic  Panel: Recent Labs  Lab 07/19/21 0408 07/20/21 0334 07/21/21 0514 07/23/21 0345 07/24/21 0948 07/25/21 0832  NA 131* 133* 132* 133* 130* 131*  K 3.8 3.8 3.8 3.9 4.1 3.7  CL 97* 98 98 98 97* 100  CO2 27 29 29 25 22  19*  GLUCOSE 239* 241* 261* 246* 302* 260*  BUN 14 14 17  31* 44* 49*  CREATININE 0.46 0.37* 0.41* 0.46 0.66 0.61  CALCIUM 7.5* 7.7* 7.5* 7.9* 8.0* 7.8*  MG 1.8 1.9 1.9  --   --   --     GFR: Estimated Creatinine Clearance: 93.6 mL/min (by C-G formula based on SCr of 0.61 mg/dL). Liver Function Tests: Recent Labs  Lab 07/20/21 0334 07/21/21 0514 07/23/21 0345 07/24/21 0948 07/25/21 0832  AST 27 27 24  14* 15  ALT 22 27 29 20 16   ALKPHOS 45 47 57 65 63  BILITOT 0.8 0.8 0.7 0.9 1.2  PROT 5.4* 5.4* 5.6* 5.9* 5.7*  ALBUMIN 2.0* 2.0* 2.0* 2.0* 2.2*    No results for input(s): LIPASE, AMYLASE in the last 168 hours. No results for input(s): AMMONIA in the last 168 hours. Coagulation Profile: No results for input(s): INR, PROTIME in the last 168 hours.  Cardiac Enzymes: No results for input(s):  CKTOTAL, CKMB, CKMBINDEX, TROPONINI in the last 168 hours. BNP (last 3 results) No results for input(s): PROBNP in the last 8760 hours. HbA1C: No results for input(s): HGBA1C in the last 72 hours. CBG: Recent Labs  Lab 07/24/21 1628 07/24/21 1820 07/24/21 2121 07/25/21 0741 07/25/21 1140  GLUCAP 181* 198* 95 241* 209*    Lipid Profile: No results for input(s): CHOL, HDL, LDLCALC, TRIG, CHOLHDL, LDLDIRECT in the last 72 hours. Thyroid Function Tests: No results for input(s): TSH, T4TOTAL, FREET4, T3FREE, THYROIDAB in the last 72 hours. Anemia Panel: No results for input(s): VITAMINB12, FOLATE, FERRITIN, TIBC, IRON, RETICCTPCT in the last 72 hours. Sepsis Labs: Recent Labs  Lab 07/24/21 1949 07/24/21 2300 07/25/21 0200  LATICACIDVEN 1.7 1.4 1.4     Recent Results (from the past 240 hour(s))  Resp Panel by RT-PCR (Flu A&B, Covid) Nasopharyngeal Swab      Status: None   Collection Time: 06/19/2021  3:44 PM   Specimen: Nasopharyngeal Swab; Nasopharyngeal(NP) swabs in vial transport medium  Result Value Ref Range Status   SARS Coronavirus 2 by RT PCR NEGATIVE NEGATIVE Final    Comment: (NOTE) SARS-CoV-2 target nucleic acids are NOT DETECTED.  The SARS-CoV-2 RNA is generally detectable in upper respiratory specimens during the acute phase of infection. The lowest concentration of SARS-CoV-2 viral copies this assay can detect is 138 copies/mL. A negative result does not preclude SARS-Cov-2 infection and should not be used as the sole basis for treatment or other patient management decisions. A negative result may occur with  improper specimen collection/handling, submission of specimen other than nasopharyngeal swab, presence of viral mutation(s) within the areas targeted by this assay, and inadequate number of viral copies(<138 copies/mL). A negative result must be combined with clinical observations, patient history, and epidemiological information. The expected result is Negative.  Fact Sheet for Patients:  EntrepreneurPulse.com.au  Fact Sheet for Healthcare Providers:  IncredibleEmployment.be  This test is no t yet approved or cleared by the [place] FDA and  has been authorized for detection and/or diagnosis of SARS-CoV-2 by FDA under an Emergency Use Authorization (EUA). This EUA will remain  in effect (meaning this test can be used) for the duration of the COVID-19 declaration under Section 564(b)(1) of the Act, 21 U.S.C.section 360bbb-3(b)(1), unless the authorization is terminated  or revoked sooner.       Influenza A by PCR NEGATIVE NEGATIVE Final   Influenza B by PCR NEGATIVE NEGATIVE Final    Comment: (NOTE) The Xpert Xpress SARS-CoV-2/FLU/RSV plus assay is intended as an aid in the diagnosis of influenza from Nasopharyngeal swab specimens and should not be used as a sole basis  for treatment. Nasal washings and aspirates are unacceptable for Xpert Xpress SARS-CoV-2/FLU/RSV testing.  Fact Sheet for Patients: EntrepreneurPulse.com.au  Fact Sheet for Healthcare Providers: IncredibleEmployment.be  This test is not yet approved or cleared by the [place] FDA and has been authorized for detection and/or diagnosis of SARS-CoV-2 by FDA under an Emergency Use Authorization (EUA). This EUA will remain in effect (meaning this test can be used) for the duration of the COVID-19 declaration under Section 564(b)(1) of the Act, 21 U.S.C. section 360bbb-3(b)(1), unless the authorization is terminated or revoked.  Performed at [facility], [address], [place], [place] [address]   Urine Culture     Status: Abnormal   Collection Time: 07/20/21 12:29 PM   Specimen: Urine, Catheterized  Result Value Ref Range Status   Specimen Description   Final  URINE, CATHETERIZED Performed at [facility], [place] [address], [place], [place] [address]    Special Requests   Final    Immunocompromised Performed at [facility], [place] [address], [place], [place] [address]    Culture (A)  Final    <10,000 COLONIES/mL INSIGNIFICANT GROWTH Performed at [facility] [address], [place], [place] [address]    Report Status 07/21/2021 FINAL  Final  MRSA Next Gen by PCR, Nasal     Status: Abnormal   Collection Time: 07/24/21  5:58 PM   Specimen: Nasal Mucosa; Nasal Swab  Result Value Ref Range Status   MRSA by PCR Next Gen DETECTED (A) NOT DETECTED Final    Comment: RESULT CALLED TO, READ BACK BY AND VERIFIED WITH: CALDERON,E RN @2141  ON 07/24/21 JACKSON,K (NOTE) The GeneXpert MRSA Assay (FDA approved for NASAL specimens only), is one component of a comprehensive MRSA colonization surveillance program. It is not intended to diagnose MRSA infection nor to guide or monitor treatment for  MRSA infections. Test performance is not FDA approved in patients less than 63 years old. Performed at [facility], [place] [address], [place], [place] [address]   Culture, blood (routine x 2)     Status: None (Preliminary result)   Collection Time: 07/24/21  7:57 PM   Specimen: BLOOD  Result Value Ref Range Status   Specimen Description   Final    BLOOD BLOOD LEFT HAND Performed at [facility] [address], [place], [place] [address]    Special Requests   Final    BOTTLES DRAWN AEROBIC ONLY Blood Culture adequate volume Performed at [facility] [address], [place], [place] [address]    Culture   Final    NO GROWTH < 12 HOURS Performed at [facility] [address], [place], [place] [address]    Report Status PENDING  Incomplete  Culture, blood (routine x 2)     Status: None (Preliminary result)   Collection Time: 07/24/21  7:57 PM   Specimen: BLOOD  Result Value Ref Range Status   Specimen Description   Final    BLOOD LEFT WRIST Performed at [facility] [address] [place], [place] [address]    Special Requests   Final    BOTTLES DRAWN AEROBIC ONLY Blood Culture adequate volume Performed at [facility] [address], [place], [place] [address]    Culture   Final    NO GROWTH < 12 HOURS Performed at [facility] [address], [place], [place] [address]    Report Status PENDING  Incomplete          Radiology Studies: CT ABDOMEN PELVIS WO CONTRAST  Result Date: 07/24/2021 CLINICAL DATA:  Abdominal distension. Metastatic pancreatic carcinoma. IV contrast allergy. Vomiting and diarrhea. EXAM: CT ABDOMEN AND PELVIS WITHOUT CONTRAST TECHNIQUE: Multidetector CT imaging of the abdomen and pelvis was performed following the standard protocol without IV contrast. COMPARISON:  06/24/2021 FINDINGS: Lower chest: Atelectasis in the left lower lobe, right  middle lobe, and right lower lobe. Large right pleural effusion. Small stable pericardial effusion. Central catheter tip terminates in the right atrium. Anterior pericardial node 0.6 cm in short axis on image 8 series 2. Hepatobiliary: Heterogeneity in the liver probably reflecting metastatic lesions. Nonvisualization of the gallbladder, presumed surgically absent. Pancreas: Solid mass of the pancreatic tail 4.6 by 2.9 cm, roughly stable. Spleen: Unremarkable Adrenals/Urinary  Tract: Punctate bilateral nonobstructive renal calculi. Adrenal glands unremarkable. Stomach/Bowel: Frothy fluid levels in the distal colon and rectum with diarrheal process. Dilated colon down to the level of the sigmoid, there is some adjacent nodularity but a definite cause for the transition is not observed no substantially dilated small bowel currently. Vascular/Lymphatic: Patency not assessed due to lack of IV contrast. No periaortic adenopathy is identified. There appears to be a trace amount of gas in a likely venous structure tracking along the anterior omentum on image 41 series 2, this could be related to gas from an IV as I do not demonstrate other classic hallmarks of portal venous gas along more characteristic locations for example in the mesenteric bowel. Reproductive: Uterus appears mildly flattened anteriorly, with large conglomerate pelvic cystic mass involving both adnexa measuring about 15.6 by 11.6 cm on image 66 of series 2, roughly similar to prior. Other: Substantial ascites similar to prior with mesenteric edema and extensive scattered nodularity along the mesentery and omentum compatible peritoneal tumor. 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 IMPRESSION: 1. Trace gas in tiny venous structures to the left of the stomach and also along the anterior omentum. Although not a typical distribution for portal venous gas, there is also dilated colon up to the level of  the sigmoid colon (without visible pneumatosis). Correlate with lactate levels in assessing for the possibility of ischemic bowel. 2. Generally similar appearance of large right effusion, substantial ascites, extensive peritoneal metastatic disease, large cystic pelvic masses, pancreatic tail tumor, and some heterogeneity in the liver suspicious for hepatic metastatic lesions. 3. Bilateral punctate nonobstructive nephrolithiasis. 4. Atelectasis in the left lower lobe, right middle lobe, and right lower lobe. 5. Stable small pericardial effusion. Critical Value/emergent results were called by telephone at the time of interpretation on 07/24/2021 at 6:45 pm to provider Dr. Verlee Monte, who verbally acknowledged these results. Electronically Signed   By: Van Clines M.D.   On: 07/24/2021 18:46   DG Chest 1 View  Result Date: 07/24/2021 CLINICAL DATA:  Abdominal pain, nausea and vomiting, pancreatic cancer EXAM: CHEST  1 VIEW COMPARISON:  06/23/2021 chest radiograph. FINDINGS: Right internal jugular Port-A-Cath terminates over the right atrium, stable. Stable cardiomediastinal silhouette with normal heart size. No pneumothorax. Small to moderate right and small left pleural effusions, slightly increased. No overt pulmonary edema. Hazy bibasilar lung opacities, mildly worsened, favor atelectasis. IMPRESSION: Small to moderate right and small left pleural effusions, slightly increased. Hazy bibasilar lung opacities, mildly worsened, favor atelectasis. Electronically Signed   By: Ilona Sorrel M.D.   On: 07/24/2021 11:26   DG Abd 1 View  Result Date: 07/25/2021 CLINICAL DATA:  Check gastric catheter placement EXAM: ABDOMEN - 1 VIEW COMPARISON:  None. FINDINGS: Gastric catheter is noted within the stomach. Scattered large and small bowel gas is noted similar to that seen on the prior exam. No free air is seen. IMPRESSION: Gastric catheter in the stomach. Findings suggestive of ileus. Electronically Signed   By: Inez Catalina M.D.   On: 07/25/2021 00:00   DG Abd 1 View  Result Date: 07/24/2021 CLINICAL DATA:  Abdominal pain, nausea and vomiting EXAM: ABDOMEN - 1 VIEW COMPARISON:  07/21/2021 abdominal radiographs FINDINGS: No disproportionately dilated small bowel loops. Prominent gaseous distension of the colon, most prominent in the transverse colon, worsened. No evidence of pneumatosis or pneumoperitoneum. No radiopaque nephrolithiasis. IMPRESSION: Prominent gaseous distension of the colon, most prominent in the transverse colon, worsened. Findings  are suggestive of colonic ileus. No disproportionately dilated small bowel loops to suggest small bowel obstruction. Electronically Signed   By: Ilona Sorrel M.D.   On: 07/24/2021 11:25        Scheduled Meds:  chlorhexidine  15 mL Mouth Rinse BID   Chlorhexidine Gluconate Cloth  6 each Topical Daily   feeding supplement  237 mL Oral BID BM   fentaNYL  1 patch Transdermal Q72H   insulin aspart  0-9 Units Subcutaneous TID WC   mouth rinse  15 mL Mouth Rinse q12n4p   mupirocin ointment  1 application Nasal BID   pantoprazole (PROTONIX) IV  40 mg Intravenous Q12H   scopolamine  1 patch Transdermal Q72H   sodium chloride flush  3 mL Intravenous Q12H   Continuous Infusions:  albumin human 25 g (07/25/21 1208)   ceFEPime (MAXIPIME) IV 200 mL/hr at 07/25/21 1049   heparin 1,200 Units/hr (07/25/21 1049)   metronidazole Stopped (07/25/21 0932)   promethazine (PHENERGAN) injection (IM or IVPB) 12.5 mg (07/24/21 1511)     LOS: 10 days    Georgette Shell, MD 07/25/2021, 1:05 PM

## 2021-07-25 NOTE — Progress Notes (Signed)
Daily Progress Note   Patient Name: Angela Wilson       Date: 07/25/2021 DOB: 1965-11-01  Age: 55 y.o. MRN#: [id number] Attending Physician: Georgette Shell, MD Primary Care Physician: Just, Laurita Quint, FNP (Inactive) Admit Date: 07/10/2021  Reason for Consultation/Follow-up: Establishing goals of care  Subjective: I saw and examined Angela Wilson t this morning and discussed with Angela daughter, Rehab, at the bedside.  Rehab and I discussed Angela clinical course and understanding of Angela Wilson's situation.  She understands that she is critically ill but also feels that there are times where some of the changes we have been seeing may be related to side effects from interventions as much as they are related to disease progression.  For example, she reports that Angela Wilson had been getting regular doses of Dilaudid and this can contribute to slow gut motility, makes Angela sleepy which prevents Angela from being more active, and  resulted in Angela getting to a point where she cannot participate with physical therapy.  She certainly wants Angela Wilson to receive pain medication when she is hurting, however, she does not want Angela to receive it " just to do it" if she is not in pain and requesting it at that time.  We also discussed Angela primary concern at this time being to have Angela brother and sister come from [place] to see their Wilson.  Angela brother has an appointment with the consulate today and Angela sister is applying for emergency visa as well.  Per Rehab's request, I have sent an email on Angela behalf requesting priority review of Angela case due to an emergency situation.  We then discussed that one of the most important things to Angela Wilson and Angela family is their faith as a Muslim family.  I  asked for Rehab to help me understand their religious views on aggressive care in light of Angela Wilson's continued clinical decline.  Rehab tells me that Angela Wilson and Angela family are placing their faith in God to provide healing.  I affirmed that Angela Wilson's clinical course is very concerning as she has been receiving maximal medical care and still declining.  I shared that I also believed that the only way she was going to get better was through God's intervention rather than through further aggressive medical care.  Rehab also explained to me that their religion interprets that any interventions that may prolong life must be pursued.  We discussed heroic interventions at the end of life including use of life support (mechanical ventilation) and CPR.  We discussed that CPR occurs at the point in time where one's heart stops and many view this stopping of the heart as being a point of natural death that would not indicate a need to pursue further interventions.  Rehab is open to conversation and we discussed that Angela family's interpretation of their faith includes the belief that CPR would be required intervention in order to fulfill their religious few that all interventions must be pursued.  We discussed desire for continuation of full code/full scope care in light of this.  I expressed appreciation for Angela helping me to better understand.  Length of Stay: 10  Current Medications: Scheduled Meds:   chlorhexidine  15 mL Mouth Rinse BID   Chlorhexidine Gluconate Cloth  6 each Topical Daily   feeding supplement  237 mL Oral BID BM   fentaNYL  1 patch Transdermal Q72H   insulin aspart  0-9 Units Subcutaneous TID WC   mouth rinse  15 mL Mouth Rinse q12n4p   mupirocin ointment  1 application Nasal BID   pantoprazole (PROTONIX) IV  40 mg Intravenous Q12H   scopolamine  1 patch Transdermal Q72H   sodium chloride flush  3 mL Intravenous Q12H    Continuous Infusions:  albumin human 25 g  (07/25/21 1208)   ceFEPime (MAXIPIME) IV 200 mL/hr at 07/25/21 1049   heparin 1,200 Units/hr (07/25/21 1049)   metronidazole Stopped (07/25/21 0932)   promethazine (PHENERGAN) injection (IM or IVPB) 12.5 mg (07/24/21 1511)    PRN Meds: acetaminophen **OR** acetaminophen, HYDROmorphone (DILAUDID) injection, lip balm, LORazepam, metoprolol tartrate, ondansetron **OR** ondansetron (ZOFRAN) IV, prochlorperazine, promethazine (PHENERGAN) injection (IM or IVPB), sodium chloride flush  Physical Exam         Appears chronically ill fatigued Regular work of breathing Has O2 Warrior Abdomen distended Has edema  Vital Signs: BP 136/81   Pulse (!) 133   Temp 99.1 F (37.3 C)   Resp (!) 30   Ht 5\' 6"  (1.676 m)   Wt 93.4 kg   SpO2 96%   BMI 33.23 kg/m  SpO2: SpO2: 96 % O2 Device: O2 Device: Room Air O2 Flow Rate: O2 Flow Rate (L/min): 0 L/min  Intake/output summary:  Intake/Output Summary (Last 24 hours) at 07/25/2021 1234 Last data filed at 07/25/2021 1049 Gross per 24 hour  Intake 1903.72 ml  Output 1486 ml  Net 417.72 ml    LBM: Last BM Date: 07/25/21 Baseline Weight: Weight: 100.7 kg Most recent weight: Weight: 93.4 kg PPS 30%      Palliative Assessment/Data:      Patient Active Problem List   Diagnosis Date Noted   Intravascular volume depletion 07/18/2021   Internal jugular (IJ) vein thromboembolism, chronic, right ([place]) 07/16/2021   Goals of care, counseling/discussion 07/16/2021   Acute respiratory failure with hypoxia ([place]) 07/10/2021   Abdominal ascites 07/18/2021   Pleural effusion on right 06/19/2021   Hyponatremia 07/04/2021   Genetic testing 05/12/2020   Family history of breast cancer    Family history of ovarian cancer    Family history of kidney cancer    Family history of brain cancer    Essential hypertension, benign 04/06/2020   Pancreatic cancer ([place]) 02/14/2020   Cancer associated pain 02/13/2020   Stage IV adenocarcinoma of  pancreas ([place])  02/03/2020   Screening for colon cancer 01/07/2020   Encounter for screening mammogram for malignant neoplasm of breast 01/07/2020   Need for Tdap vaccination 01/07/2020   Pancreatic mass 12/30/2019   Nephrolithiasis 12/20/2012   GERD (gastroesophageal reflux disease) 12/20/2012   Type 2 diabetes mellitus ([place]) 11/09/2012    Palliative Care Assessment & Plan   Patient Profile:    Assessment:  55 yo lady with life limiting illness of pancreatic cancer, ascites, abdominal pain, LE edema.   Recommendations/Plan: Continue current care.  I again discussed with Angela daughter regarding aggressive interventions in light of continued clinical decline.  We discussed the reasons for desire for continued aggressive interventions due to Angela Wilson's beliefs as noted above. I completed documentation outlining Angela medical illness to support Angela son and daughter's applications for visitation visa earlier this week.  I sent an email to [facility] today requesting priority appointment for Angela sister.  Angela brother has an appointment to discuss visa with consulate today.   Goals of Care and Additional Recommendations: Limitations on Scope of Treatment: Full Scope Treatment  Code Status:    Code Status Orders  (From admission, onward)           Start     Ordered   06/24/2021 2022  Full code  Continuous        06/26/2021 2024           Code Status History     Date Active Date Inactive Code Status Order ID Comments User Context   02/14/2020 0213 02/18/2020 2347 Full Code [id number]  Clance Boll, MD Inpatient       Prognosis: Likely weeks at best, she continues to have decline overall and is at high risk for acute decompensation.  Discharge Planning: To Be Determined although I fear this will be a terminal admission.  Care plan was discussed with IDT.    Thank you for allowing the Palliative Medicine Team to assist in the care of this patient.   Total Time 50 Prolonged Time  Billed  no   Greater than 50%  of this time was spent counseling and coordinating care related to the above assessment and plan.  Micheline Rough, MD  Please contact Palliative Medicine Team phone at [phone] for questions and concerns.

## 2021-07-25 NOTE — Progress Notes (Signed)
Bladder scan showed 477 CC of urine, patient denies any pain. I/O performed per protocol.

## 2021-07-26 ENCOUNTER — Inpatient Hospital Stay (HOSPITAL_COMMUNITY): Payer: 59

## 2021-07-26 DIAGNOSIS — J9601 Acute respiratory failure with hypoxia: Secondary | ICD-10-CM | POA: Diagnosis not present

## 2021-07-26 LAB — COMPREHENSIVE METABOLIC PANEL
ALT: 16 U/L (ref 0–44)
AST: 13 U/L — ABNORMAL LOW (ref 15–41)
Albumin: 2.9 g/dL — ABNORMAL LOW (ref 3.5–5.0)
Alkaline Phosphatase: 54 U/L (ref 38–126)
Anion gap: 16 — ABNORMAL HIGH (ref 5–15)
BUN: 38 mg/dL — ABNORMAL HIGH (ref 6–20)
CO2: 20 mmol/L — ABNORMAL LOW (ref 22–32)
Calcium: 8.3 mg/dL — ABNORMAL LOW (ref 8.9–10.3)
Chloride: 100 mmol/L (ref 98–111)
Creatinine, Ser: 0.47 mg/dL (ref 0.44–1.00)
GFR, Estimated: >60 mL/min (ref >60)
Glucose, Bld: 245 mg/dL — ABNORMAL HIGH (ref 70–99)
Potassium: 3.3 mmol/L — ABNORMAL LOW (ref 3.5–5.1)
Sodium: 136 mmol/L (ref 135–145)
Total Bilirubin: 1.5 mg/dL — ABNORMAL HIGH (ref 0.3–1.2)
Total Protein: 5.9 g/dL — ABNORMAL LOW (ref 6.5–8.1)

## 2021-07-26 LAB — MAGNESIUM: Magnesium: 2.2 mg/dL (ref 1.7–2.4)

## 2021-07-26 LAB — URINE CULTURE

## 2021-07-26 LAB — CBC
HCT: 27.7 % — ABNORMAL LOW (ref 36.0–46.0)
Hemoglobin: 8.6 g/dL — ABNORMAL LOW (ref 12.0–15.0)
MCH: 26.5 pg (ref 26.0–34.0)
MCHC: 31 g/dL (ref 30.0–36.0)
MCV: 85.2 fL (ref 80.0–100.0)
Platelets: 275 10*3/uL (ref 150–400)
RBC: 3.25 MIL/uL — ABNORMAL LOW (ref 3.87–5.11)
RDW: 18.7 % — ABNORMAL HIGH (ref 11.5–15.5)
WBC: 2.6 10*3/uL — ABNORMAL LOW (ref 4.0–10.5)
nRBC: 0 % (ref 0.0–0.2)

## 2021-07-26 LAB — GLUCOSE, CAPILLARY
Glucose-Capillary: 223 mg/dL — ABNORMAL HIGH (ref 70–99)
Glucose-Capillary: 207 mg/dL — ABNORMAL HIGH (ref 70–99)
Glucose-Capillary: 231 mg/dL — ABNORMAL HIGH (ref 70–99)
Glucose-Capillary: 190 mg/dL — ABNORMAL HIGH (ref 70–99)
Glucose-Capillary: 213 mg/dL — ABNORMAL HIGH (ref 70–99)
Glucose-Capillary: 177 mg/dL — ABNORMAL HIGH (ref 70–99)

## 2021-07-26 LAB — LACTATE DEHYDROGENASE, PLEURAL OR PERITONEAL FLUID: LD, Fluid: 98 U/L — ABNORMAL HIGH (ref 3–23)

## 2021-07-26 LAB — HEPARIN LEVEL (UNFRACTIONATED): Heparin Unfractionated: 0.3 IU/mL (ref 0.30–0.70)

## 2021-07-26 LAB — GLUCOSE, PLEURAL OR PERITONEAL FLUID: Glucose, Fluid: 204 mg/dL

## 2021-07-26 MED ORDER — PHENOL 1.4 % MT LIQD
1.0000 | OROMUCOSAL | Status: DC | PRN
  Filled 2021-07-26: qty 177

## 2021-07-26 MED ORDER — POTASSIUM CHLORIDE 10 MEQ/50ML IV SOLN
10.0000 meq | INTRAVENOUS | Status: AC
  Administered 2021-07-26 (×3): 10 meq via INTRAVENOUS
  Filled 2021-07-26 (×3): qty 50

## 2021-07-26 NOTE — Progress Notes (Signed)
Patient's daughter called out to notify RN that patient had pulled out her NG tube while she had stepped out of the room. MD rounded on patient shortly after. Per MD, okay to hold off on placing NG tube back in (per patient's daughter's request), however, if patient vomits or has increased nausea, NG tube will need to be replaced.

## 2021-07-26 NOTE — Progress Notes (Signed)
Palliative care brief progress note  I checked in briefly on Angela Wilson today.  Rehab tells me that her brother did get visa approval to come and visit with her.  They are still waiting for her sister get an appointment for approval as well.  I let her know that I will be going off service but Dr. Rowe Pavy will be returning if they need anything.  She expressed thanks and has our contact information for any immediate concerns.  Micheline Rough, MD [place] Team [phone]  No charge note

## 2021-07-26 NOTE — Progress Notes (Signed)
ANTICOAGULATION CONSULT NOTE   Pharmacy Consult for IV heparin Indication: right IJ thrombus   Allergies  Allergen Reactions   Ivp Dye [Iodinated Diagnostic Agents] Itching and Rash   Morphine And Related Hives   Penicillins Rash    Patient Measurements: Height: 5\' 6"  (167.6 cm) Weight: 93.8 kg (206 lb 12.7 oz) IBW/kg (Calculated) : 59.3 Heparin Dosing Weight: 80.6 kg  Vital Signs: Temp: 99 F (37.2 C) (11/07 0726) BP: 135/69 (11/07 0600) Pulse Rate: 126 (11/07 0726)  Labs: Recent Labs    07/24/21 0459 07/24/21 0948 07/24/21 2130 07/24/21 2130 07/25/21 0400 07/25/21 0440 07/25/21 0832 07/25/21 1706 07/26/21 0500  HGB 11.4*  --   --   --   --   --  9.6*  --  8.6*  HCT 36.9  --   --   --   --   --  31.0*  --  27.7*  PLT 311  --   --   --   --   --  283  --  275  APTT  --   --  27  --  108*  --   --   --   --   HEPARINUNFRC  --   --  0.61   < >  --  0.77*  --  0.47 0.30  CREATININE  --  0.66  --   --   --   --  0.61  --  0.47   < > = values in this interval not displayed.    Estimated Creatinine Clearance: 93.8 mL/min (by C-G formula based on SCr of 0.47 mg/dL).  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 CT A/P today revealed portal venous gas, distended colon, concern for bowel ischemia. Patient made NPO. Pharmacy consulted for IV heparin dosing while Xarelto on hold. Patient had previously been on Xarelto 20mg  PO daily, but had recently been reduced to 10mg  PO daily on 07/21/21 by Oncology. Last dose of Xarelto 10mg  PO was 07/23/21 at 1639. Note that recent Xarelto use can falsely elevate heparin level.    Today. 07/26/2021: Heparin level 0.3, low, therapeutic on heparin 1200 units/hr CBC:  Hgb decreased further to 8.6.  Per Dr. Marin Olp, may be related to recent chemotherapy, but not bleeding reported.     Goal of Therapy:  Heparin level 0.3-0.7 units/ml aPTT 66-102 seconds Monitor platelets by anticoagulation protocol: Yes    Plan:  Continue Heparin infusion 1200 units/hr Daily CBC, heparin level Monitor closely for s/sx of bleeding   Gretta Arab PharmD, BCPS Clinical Pharmacist [facility] main pharmacy [phone] 07/26/2021 8:03 AM

## 2021-07-26 NOTE — Progress Notes (Signed)
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  She has undergone extensive treatment since May 2021.  Her cancer seems to have progressed since then.   Events in the last 7 days-patient continues to be tachycardic with a resting heart rate in the 130s to 140s with severe deconditioning and increasing in heart rate with any activity.  Her nausea and vomiting had subsided initially however she had not had any bowel movement so she was given MiraLAX Dulcolax and lactulose for 1 day.  She started having diarrhea and had large bowel movements and a rectal tube was placed at that point.  All the laxatives were stopped.  She received laxatives only 1 day.  Then she started to have intractable nausea vomiting NG tube was placed and she was moved to stepdown unit.  Stat CT of the abdomen and pelvis without contrast(she has severe contrast allergy) possible ischemic bowel and colonic ileus.  PCCM and general surgery was consulted. When she was moved to stepdown unit her respiratory rate was in the 40s and her heart rate was in the 150s. Dr. Barry Dienes saw the patient she did not think patient has ischemic bowel and even if she did have ischemic bowel she was not a candidate for surgery due to peritoneal carcinomatosis.  Her lactic acid was normal. PCCM followed the patient over the weekend and signed off on 07/25/2021. Patient also developed increasing right pleural effusion and increasing ascites. She received albumin infusion for 6 doses. Xarelto was stopped and heparin was started as there was some blood-tinged urine as well as NG tube drainage.  And she was n.p.o. due to colonic ileus and nausea vomiting with NG  tube in place. Her medications were narrowed down on 07/23/2021 to include only the ones she really needs it and was able to be converted to IV.  Daughter involved very involved with patient's care and patient remains full code in spite of multiple consultants PCCM oncology palliative care General surgery and hospitalist team explaining to her that her prognosis is extremely poor prognosis. Assessment & Plan:   Principal Problem:   Acute respiratory failure with hypoxia (HCC) Active Problems:   Type 2 diabetes mellitus (HCC)   Stage IV adenocarcinoma of pancreas (HCC)   Essential hypertension, benign   Abdominal ascites   Pleural effusion on right   Hyponatremia   Internal jugular (IJ) vein thromboembolism, chronic, right (HCC)   Goals of care, counseling/discussion   Intravascular volume depletion  #1 stage IV adenocarcinoma of the pancreas diagnosed in May 2021 with mets to the peritoneum -patient has had multiple rounds of chemotherapy.  She is not a candidate for any chemotherapy at this time.  She was seen by Dr. Marin Olp.  Palliative care was consulted however due to religious believes patient's daughter believes that she is going to be healed.  We will continue discussions with the patient's daughter. CT abdomen 07/24/2021-large right effusion substantial ascites extensive peritoneal metastatic disease large cystic pelvic masses pancreatic tail tumor and heterogenicity in the liver suspicious for hepatic metastatic lesions atelectasis in the left lower lobe right middle lobe and right lower lobe stable small pericardial effusion. Will consult IR for thoracentesis and paracentesis.  Patient pulled out her NG tube 07/26/2021. KUB 07/26/2021 with  worsening colonic ileus.  Daughter prefers not to put the NG tube back in.  However I have told her if she vomits we will have to put it back again.  #2 acute hypoxic respiratory failure due to large right pleural effusion we will consult IR for  thoracentesis and paracentesis   #3 ascites patient was taken down to IR for possible paracentesis last week however she had very minimal amount of ascites fluid which was in close proximity to bowels so this was not attempted. Repeat scan shows increasing ascites and abdominal distention will consult IR today  #4 goals of care patient remains a full code with very poor prognosis.  #5 tachycardia-multifactorial I have started her on low-dose Lopressor.  Blood pressure stable.  Echo with normal ejection fraction.  #6 right IJ thrombus on heparin  #7  Fever no evidence of infection noted so far.  This is likely tumor fever.  Continue Toradol.  On Flagyl for possible intra-abdominal infection.  Was started on Flagyl over the weekend when she was moved to stepdown unit for possible intra-abdominal infection.  #8 colonic ileus-NG tube pulled out by the patient on 07/26/2021.  KUB shows worsening colonic ileus.   Place NG tube back if she starts vomiting. She is also getting Zofran, Compazine, Emend  Follow-up KUB in a.m. Unfortunately she is not able to ambulate or move much due to generalized deconditioning weakness and limitation due to tachycardia  #9 hypokalemia potassium 3.3 being repleted.  Her magnesium is 2.2.  Estimated body mass index is 33.38 kg/m as calculated from the following:   Height as of this encounter: 5\' 6"  (1.676 m).   Weight as of this encounter: 93.8 kg.  DVT prophylaxis: Heparin code Status: Full code Family Communication: Discussed with daughter at bedside disposition Plan:  Status is: Inpatient  Remains inpatient appropriate because: Tachycardic with stage IV pancreatic CA with mets  Consultants:  Oncology  Procedures: Paracentesis Antimicrobials: None  Subjective:   She is more awake today requesting food Daughter by the bedside She pulled out her NG tube few minutes ago complains of abdominal pain Objective: Vitals:   07/26/21 0726 07/26/21 0800  07/26/21 0900 07/26/21 1000  BP:  (!) 111/59 125/74 127/61  Pulse: (!) 126 (!) 128 (!) 130   Resp: (!) 26 (!) 26 (!) 21 (!) 26  Temp: 99 F (37.2 C) 99.1 F (37.3 C) 99.1 F (37.3 C) 99.3 F (37.4 C)  TempSrc:      SpO2: 97% 96% 96%   Weight:      Height:        Intake/Output Summary (Last 24 hours) at 07/26/2021 1046 Last data filed at 07/26/2021 1000 Gross per 24 hour  Intake 641.31 ml  Output 1075 ml  Net -433.69 ml    Filed Weights   07/23/21 0447 07/25/21 0500 07/26/21 0500  Weight: 95.7 kg 93.4 kg 93.8 kg    Examination: Tachycardic  General exam: Appears uncomfortable and ill appearing Respiratory system: Diminished the bases to auscultation. Respiratory effort normal. Cardiovascular system: S1 & S2 heard, RRR. No JVD, murmurs, rubs, gallops or clicks.  Gastrointestinal system: Abdomen is distended, tender.  Hard palpable area around the umbilicus  No bowel sounds heard. Central nervous system: Drowsy  extremities: 1+ pitting edema  skin: No rashes, lesions or ulcers Psychiatry: Unable to assess  Data Reviewed: I have personally reviewed following labs and imaging studies  CBC: Recent Labs  Lab 07/20/21 0334 07/21/21 0514 07/23/21 0345 07/24/21 0459  07/25/21 0832 07/26/21 0500  WBC 3.5* 4.0 3.2* 2.0* 2.1* 2.6*  NEUTROABS 2.3 2.7 2.1  --   --   --   HGB 10.2* 10.2* 10.7* 11.4* 9.6* 8.6*  HCT 33.4* 34.3* 34.7* 36.9 31.0* 27.7*  MCV 88.4 87.5 86.1 84.8 84.9 85.2  PLT 144* 182 252 311 283 299    Basic Metabolic Panel: Recent Labs  Lab 07/20/21 0334 07/21/21 0514 07/23/21 0345 07/24/21 0948 07/25/21 0832 07/26/21 0500  NA 133* 132* 133* 130* 131* 136  K 3.8 3.8 3.9 4.1 3.7 3.3*  CL 98 98 98 97* 100 100  CO2 29 29 25 22  19* 20*  GLUCOSE 241* 261* 246* 302* 260* 245*  BUN 14 17 31* 44* 49* 38*  CREATININE 0.37* 0.41* 0.46 0.66 0.61 0.47  CALCIUM 7.7* 7.5* 7.9* 8.0* 7.8* 8.3*  MG 1.9 1.9  --   --   --  2.2    GFR: Estimated Creatinine  Clearance: 93.8 mL/min (by C-G formula based on SCr of 0.47 mg/dL). Liver Function Tests: Recent Labs  Lab 07/21/21 0514 07/23/21 0345 07/24/21 0948 07/25/21 0832 07/26/21 0500  AST 27 24 14* 15 13*  ALT 27 29 20 16 16   ALKPHOS 47 57 65 63 54  BILITOT 0.8 0.7 0.9 1.2 1.5*  PROT 5.4* 5.6* 5.9* 5.7* 5.9*  ALBUMIN 2.0* 2.0* 2.0* 2.2* 2.9*    No results for input(s): LIPASE, AMYLASE in the last 168 hours. No results for input(s): AMMONIA in the last 168 hours. Coagulation Profile: No results for input(s): INR, PROTIME in the last 168 hours.  Cardiac Enzymes: No results for input(s): CKTOTAL, CKMB, CKMBINDEX, TROPONINI in the last 168 hours. BNP (last 3 results) No results for input(s): PROBNP in the last 8760 hours. HbA1C: No results for input(s): HGBA1C in the last 72 hours. CBG: Recent Labs  Lab 07/25/21 1608 07/25/21 1939 07/25/21 2321 07/26/21 0414 07/26/21 0738  GLUCAP 198* 188* 216* 207* 231*    Lipid Profile: No results for input(s): CHOL, HDL, LDLCALC, TRIG, CHOLHDL, LDLDIRECT in the last 72 hours. Thyroid Function Tests: No results for input(s): TSH, T4TOTAL, FREET4, T3FREE, THYROIDAB in the last 72 hours. Anemia Panel: No results for input(s): VITAMINB12, FOLATE, FERRITIN, TIBC, IRON, RETICCTPCT in the last 72 hours. Sepsis Labs: Recent Labs  Lab 07/24/21 1949 07/24/21 2300 07/25/21 0200  LATICACIDVEN 1.7 1.4 1.4     Recent Results (from the past 240 hour(s))  Urine Culture     Status: Abnormal   Collection Time: 07/20/21 12:29 PM   Specimen: Urine, Catheterized  Result Value Ref Range Status   Specimen Description   Final    URINE, CATHETERIZED Performed at [facility] [address], [place], [place] [address]    Special Requests   Final    Immunocompromised Performed at [facility], [place] [address], [place], [place] [address]    Culture (A)  Final    <10,000 COLONIES/mL INSIGNIFICANT  GROWTH Performed at [facility] [address] [place], [place] [address]    Report Status 07/21/2021 FINAL  Final  MRSA Next Gen by PCR, Nasal     Status: Abnormal   Collection Time: 07/24/21  5:58 PM   Specimen: Nasal Mucosa; Nasal Swab  Result Value Ref Range Status   MRSA by PCR Next Gen DETECTED (A) NOT DETECTED Final    Comment: RESULT CALLED TO, READ BACK BY AND VERIFIED WITH: CALDERON,E RN @2141  ON 07/24/21 JACKSON,K (NOTE) The GeneXpert  MRSA Assay (FDA approved for NASAL specimens only), is one component of a comprehensive MRSA colonization surveillance program. It is not intended to diagnose MRSA infection nor to guide or monitor treatment for MRSA infections. Test performance is not FDA approved in patients less than 63 years old. Performed at [facility], [place] [address] [place], [place] [address]   Culture, blood (routine x 2)     Status: None (Preliminary result)   Collection Time: 07/24/21  7:57 PM   Specimen: BLOOD  Result Value Ref Range Status   Specimen Description   Final    BLOOD BLOOD LEFT HAND Performed at [facility] [address], [place], [place] [address]    Special Requests   Final    BOTTLES DRAWN AEROBIC ONLY Blood Culture adequate volume Performed at [facility] [address], [place], [place] [address]    Culture   Final    NO GROWTH 2 DAYS Performed at [facility] [address], [place], [place] [address]    Report Status PENDING  Incomplete  Culture, blood (routine x 2)     Status: None (Preliminary result)   Collection Time: 07/24/21  7:57 PM   Specimen: BLOOD  Result Value Ref Range Status   Specimen Description   Final    BLOOD LEFT WRIST Performed at [facility] [address] [place], [place] [address]    Special Requests   Final    BOTTLES DRAWN AEROBIC ONLY Blood Culture adequate volume Performed at [facility] [address] [place], [place] [address]    Culture   Final    NO GROWTH 2 DAYS Performed at [facility] [address], [place], [place] [address]    Report Status PENDING  Incomplete  Urine Culture     Status: Abnormal   Collection Time: 07/25/21  1:36 AM   Specimen: In/Out Cath Urine  Result Value Ref Range Status   Specimen Description   Final    IN/OUT CATH URINE Performed at [facility] [address] [place], [place] [address]    Special Requests   Final    NONE Performed at [facility], [place] [address], [place],  [address]    Culture MULTIPLE SPECIES PRESENT, SUGGEST RECOLLECTION (A)  Final   Report Status 07/26/2021 FINAL  Final          Radiology Studies: CT ABDOMEN PELVIS WO CONTRAST  Result Date: 07/24/2021 CLINICAL DATA:  Abdominal distension. Metastatic pancreatic carcinoma. IV contrast allergy. Vomiting and diarrhea. EXAM: CT ABDOMEN AND PELVIS WITHOUT CONTRAST TECHNIQUE: Multidetector CT imaging of the abdomen and pelvis was performed following the standard protocol without IV contrast. COMPARISON:  07/02/2021 FINDINGS: Lower chest: Atelectasis in the left lower lobe, right middle lobe, and right lower lobe. Large right pleural effusion. Small stable pericardial effusion. Central catheter tip terminates in the right atrium. Anterior pericardial node 0.6 cm in short axis on image 8 series 2. Hepatobiliary: Heterogeneity in the liver probably reflecting metastatic lesions. Nonvisualization of the gallbladder, presumed surgically absent. Pancreas: Solid mass of the pancreatic tail 4.6 by 2.9 cm, roughly stable. Spleen: Unremarkable Adrenals/Urinary Tract: Punctate bilateral nonobstructive renal calculi. Adrenal glands unremarkable. Stomach/Bowel: Frothy fluid levels in the distal colon and rectum with diarrheal process. Dilated colon down to the level of the sigmoid, there is some adjacent nodularity but  a definite cause for the transition is not observed  no substantially dilated small bowel currently. Vascular/Lymphatic: Patency not assessed due to lack of IV contrast. No periaortic adenopathy is identified. There appears to be a trace amount of gas in a likely venous structure tracking along the anterior omentum on image 41 series 2, this could be related to gas from an IV as I do not demonstrate other classic hallmarks of portal venous gas along more characteristic locations for example in the mesenteric bowel. Reproductive: Uterus appears mildly flattened anteriorly, with large conglomerate pelvic cystic mass involving both adnexa measuring about 15.6 by 11.6 cm on image 66 of series 2, roughly similar to prior. Other: Substantial ascites similar to prior with mesenteric edema and extensive scattered nodularity along the mesentery and omentum compatible peritoneal tumor. 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 IMPRESSION: 1. Trace gas in tiny venous structures to the left of the stomach and also along the anterior omentum. Although not a typical distribution for portal venous gas, there is also dilated colon up to the level of the sigmoid colon (without visible pneumatosis). Correlate with lactate levels in assessing for the possibility of ischemic bowel. 2. Generally similar appearance of large right effusion, substantial ascites, extensive peritoneal metastatic disease, large cystic pelvic masses, pancreatic tail tumor, and some heterogeneity in the liver suspicious for hepatic metastatic lesions. 3. Bilateral punctate nonobstructive nephrolithiasis. 4. Atelectasis in the left lower lobe, right middle lobe, and right lower lobe. 5. Stable small pericardial effusion. Critical Value/emergent results were called by telephone at the time of interpretation on 07/24/2021 at 6:45 pm to provider Dr. Verlee Monte, who verbally acknowledged these results. Electronically  Signed   By: Van Clines M.D.   On: 07/24/2021 18:46   DG Chest 1 View  Result Date: 07/24/2021 CLINICAL DATA:  Abdominal pain, nausea and vomiting, pancreatic cancer EXAM: CHEST  1 VIEW COMPARISON:  07/10/2021 chest radiograph. FINDINGS: Right internal jugular Port-A-Cath terminates over the right atrium, stable. Stable cardiomediastinal silhouette with normal heart size. No pneumothorax. Small to moderate right and small left pleural effusions, slightly increased. No overt pulmonary edema. Hazy bibasilar lung opacities, mildly worsened, favor atelectasis. IMPRESSION: Small to moderate right and small left pleural effusions, slightly increased. Hazy bibasilar lung opacities, mildly worsened, favor atelectasis. Electronically Signed   By: Ilona Sorrel M.D.   On: 07/24/2021 11:26   DG Abd 1 View  Result Date: 07/25/2021 CLINICAL DATA:  Check gastric catheter placement EXAM: ABDOMEN - 1 VIEW COMPARISON:  None. FINDINGS: Gastric catheter is noted within the stomach. Scattered large and small bowel gas is noted similar to that seen on the prior exam. No free air is seen. IMPRESSION: Gastric catheter in the stomach. Findings suggestive of ileus. Electronically Signed   By: Inez Catalina M.D.   On: 07/25/2021 00:00   DG Abd 1 View  Result Date: 07/24/2021 CLINICAL DATA:  Abdominal pain, nausea and vomiting EXAM: ABDOMEN - 1 VIEW COMPARISON:  07/21/2021 abdominal radiographs FINDINGS: No disproportionately dilated small bowel loops. Prominent gaseous distension of the colon, most prominent in the transverse colon, worsened. No evidence of pneumatosis or pneumoperitoneum. No radiopaque nephrolithiasis. IMPRESSION: Prominent gaseous distension of the colon, most prominent in the transverse colon, worsened. Findings are suggestive of colonic ileus. No disproportionately dilated small bowel loops to suggest small bowel obstruction. Electronically Signed   By: Ilona Sorrel M.D.   On: 07/24/2021 11:25   DG  Abd Portable 1V  Result Date: 07/26/2021 CLINICAL DATA:  Follow-up ileus EXAM:  PORTABLE ABDOMEN - 1 VIEW COMPARISON:  Abdominal x-ray 07/24/2021 FINDINGS: Enteric tube tip is in the stomach. Redemonstration of abnormally distended gas-filled loops of bowel mostly in the mid abdomen which measure up to 8.4 cm in diameter and appear mildly increased diameter since previous study. Paucity of bowel gas in the pelvis. No suspicious calcifications visualized. IMPRESSION: Mildly increased distension of abnormal gas-filled loops of bowel in the mid abdomen since previous study. May represent obstruction or ileus. Electronically Signed   By: Ofilia Neas M.D.   On: 07/26/2021 08:30        Scheduled Meds:  chlorhexidine  15 mL Mouth Rinse BID   Chlorhexidine Gluconate Cloth  6 each Topical Daily   feeding supplement  237 mL Oral BID BM   fentaNYL  1 patch Transdermal Q72H   insulin aspart  0-9 Units Subcutaneous TID WC   mouth rinse  15 mL Mouth Rinse q12n4p   mupirocin ointment  1 application Nasal BID   pantoprazole (PROTONIX) IV  40 mg Intravenous Q12H   scopolamine  1 patch Transdermal Q72H   sodium chloride flush  3 mL Intravenous Q12H   Continuous Infusions:  ceFEPime (MAXIPIME) IV 2 g (07/26/21 1025)   heparin 1,200 Units/hr (07/26/21 1000)   metronidazole Stopped (07/26/21 0904)   potassium chloride 50 mL/hr at 07/26/21 1000   promethazine (PHENERGAN) injection (IM or IVPB) 12.5 mg (07/24/21 1511)     LOS: 11 days    Georgette Shell, MD 07/26/2021, 10:46 AM

## 2021-07-26 NOTE — Progress Notes (Signed)
Overall, this Dim[place] is about the same.  She has NG tube in place.  There is still some abdominal distention.  She is on the heparin infusion for the internal jugular thrombus.  May be, we can switch her over to Lovenox.  Her potassium is a little bit low 3.3.  I would replace the potassium and see this may help with the colon and ileus.  There is no bleeding.  Her hemoglobin is down to 8.6.  This might be from the chemotherapy that she had taken previously.  Her white cell count is 2.6.  Hemoglobin 8.6.  Platelet count 275,000.  Her BUN is 38 creatinine 0.47.  She does not appear to be in any obvious pain.  She might be a little bit more alert.  The daughter wants to know how she is going to be fed.  I think the only way to do this is with TPN.  I really doubt TPN will help the overall "picture."  However, I think the daughter is going to insist that her mother have some type of nutrition.  I know she has a Port-A-Cath in.  However, I would think that if TPN is going be used, she might need to have some other central line.  Cultures so far have not been positive.  Her vital signs show temperature of 99.  Pulse 123.  Blood pressure 135/69.  Her lungs sound clear.  Cardiac exam tachycardic but regular.  Abdomen is distended.  Bowel sounds are absent.  She has no guarding or rebound tenderness.  Extremity shows some mild chronic edema in her legs.  Neurological exam is nonfocal.  Again, I realize that continuing to be aggressive with her is going to have a very, very low likely hood of improvement.  However, based on the patient's cultural believe, we have to continue to pursue aggressive therapy.  She is not a candidate for any chemotherapy.  I do appreciate the outstanding care that she is getting from the wonderful staff down the ICU.  Lattie Haw, MD  Psalm 91:1-2

## 2021-07-26 NOTE — Procedures (Signed)
PROCEDURE SUMMARY:  Successful image-guided paracentesis from the left lower abdomen.  Yielded 2.9 liters of hazy yellow fluid.  No immediate complications.  EBL < 1 mL Patient tolerated well.   Specimen was sent for labs.  Please see imaging section of Epic for full dictation.  Joaquim Nam PA-C 07/26/2021 2:22 PM

## 2021-07-27 ENCOUNTER — Inpatient Hospital Stay (HOSPITAL_COMMUNITY): Payer: 59

## 2021-07-27 DIAGNOSIS — J9601 Acute respiratory failure with hypoxia: Secondary | ICD-10-CM | POA: Diagnosis not present

## 2021-07-27 LAB — COMPREHENSIVE METABOLIC PANEL
ALT: 13 U/L (ref 0–44)
AST: 13 U/L — ABNORMAL LOW (ref 15–41)
Albumin: 2.4 g/dL — ABNORMAL LOW (ref 3.5–5.0)
Alkaline Phosphatase: 53 U/L (ref 38–126)
Anion gap: 10 (ref 5–15)
BUN: 27 mg/dL — ABNORMAL HIGH (ref 6–20)
CO2: 20 mmol/L — ABNORMAL LOW (ref 22–32)
Calcium: 7.8 mg/dL — ABNORMAL LOW (ref 8.9–10.3)
Chloride: 107 mmol/L (ref 98–111)
Creatinine, Ser: 0.44 mg/dL (ref 0.44–1.00)
GFR, Estimated: >60 mL/min (ref >60)
Glucose, Bld: 242 mg/dL — ABNORMAL HIGH (ref 70–99)
Potassium: 3.3 mmol/L — ABNORMAL LOW (ref 3.5–5.1)
Sodium: 137 mmol/L (ref 135–145)
Total Bilirubin: 1.4 mg/dL — ABNORMAL HIGH (ref 0.3–1.2)
Total Protein: 5.4 g/dL — ABNORMAL LOW (ref 6.5–8.1)

## 2021-07-27 LAB — GLUCOSE, PLEURAL OR PERITONEAL FLUID: Glucose, Fluid: 219 mg/dL

## 2021-07-27 LAB — GLUCOSE, CAPILLARY
Glucose-Capillary: 196 mg/dL — ABNORMAL HIGH (ref 70–99)
Glucose-Capillary: 228 mg/dL — ABNORMAL HIGH (ref 70–99)
Glucose-Capillary: 231 mg/dL — ABNORMAL HIGH (ref 70–99)
Glucose-Capillary: 203 mg/dL — ABNORMAL HIGH (ref 70–99)
Glucose-Capillary: 169 mg/dL — ABNORMAL HIGH (ref 70–99)
Glucose-Capillary: 243 mg/dL — ABNORMAL HIGH (ref 70–99)
Glucose-Capillary: 230 mg/dL — ABNORMAL HIGH (ref 70–99)

## 2021-07-27 LAB — PROTEIN, PLEURAL OR PERITONEAL FLUID: Total protein, fluid: 3.3 g/dL

## 2021-07-27 LAB — CBC
HCT: 28.1 % — ABNORMAL LOW (ref 36.0–46.0)
Hemoglobin: 8.8 g/dL — ABNORMAL LOW (ref 12.0–15.0)
MCH: 26.6 pg (ref 26.0–34.0)
MCHC: 31.3 g/dL (ref 30.0–36.0)
MCV: 84.9 fL (ref 80.0–100.0)
Platelets: 276 10*3/uL (ref 150–400)
RBC: 3.31 MIL/uL — ABNORMAL LOW (ref 3.87–5.11)
RDW: 19 % — ABNORMAL HIGH (ref 11.5–15.5)
WBC: 4.6 10*3/uL (ref 4.0–10.5)
nRBC: 0.4 % — ABNORMAL HIGH (ref 0.0–0.2)

## 2021-07-27 LAB — PHOSPHORUS: Phosphorus: 1.1 mg/dL — ABNORMAL LOW (ref 2.5–4.6)

## 2021-07-27 LAB — HEPARIN LEVEL (UNFRACTIONATED): Heparin Unfractionated: 0.22 IU/mL — ABNORMAL LOW (ref 0.30–0.70)

## 2021-07-27 LAB — MAGNESIUM: Magnesium: 2.2 mg/dL (ref 1.7–2.4)

## 2021-07-27 LAB — LACTATE DEHYDROGENASE, PLEURAL OR PERITONEAL FLUID: LD, Fluid: 86 U/L — ABNORMAL HIGH (ref 3–23)

## 2021-07-27 LAB — CYTOLOGY - NON PAP

## 2021-07-27 MED ORDER — POTASSIUM PHOSPHATES 15 MMOLE/5ML IV SOLN
45.0000 mmol | Freq: Once | INTRAVENOUS | Status: AC
  Administered 2021-07-27: 45 mmol via INTRAVENOUS
  Filled 2021-07-27: qty 15

## 2021-07-27 MED ORDER — SODIUM CHLORIDE 0.9 % IV SOLN: INTRAVENOUS | Status: DC | PRN

## 2021-07-27 MED ORDER — LIDOCAINE HCL 1 % IJ SOLN
INTRAMUSCULAR | Status: AC
  Administered 2021-07-27: 15 mL
  Filled 2021-07-27: qty 20

## 2021-07-27 MED ORDER — SODIUM CHLORIDE 0.9 % IV SOLN: INTRAVENOUS | Status: DC

## 2021-07-27 MED ORDER — TRAVASOL 10 % IV SOLN
INTRAVENOUS | Status: AC
  Filled 2021-07-27: qty 537.6

## 2021-07-27 MED ORDER — SODIUM CHLORIDE 0.9 % IV SOLN: INTRAVENOUS | Status: AC

## 2021-07-27 MED ORDER — POTASSIUM CHLORIDE 10 MEQ/100ML IV SOLN
10.0000 meq | INTRAVENOUS | Status: AC
  Administered 2021-07-27 (×3): 10 meq via INTRAVENOUS
  Filled 2021-07-27 (×3): qty 100

## 2021-07-27 MED ORDER — ENOXAPARIN SODIUM 150 MG/ML IJ SOSY
150.0000 mg | PREFILLED_SYRINGE | Freq: Every day | INTRAMUSCULAR | Status: DC
  Administered 2021-07-27 – 2021-08-02 (×7): 150 mg via SUBCUTANEOUS
  Filled 2021-07-27 (×8): qty 1

## 2021-07-27 MED ORDER — INSULIN ASPART 100 UNIT/ML IJ SOLN
0.0000 [IU] | INTRAMUSCULAR | Status: DC
  Administered 2021-07-27: 3 [IU] via SUBCUTANEOUS
  Administered 2021-07-27 – 2021-07-28 (×3): 5 [IU] via SUBCUTANEOUS
  Administered 2021-07-28: 8 [IU] via SUBCUTANEOUS
  Administered 2021-07-28: 5 [IU] via SUBCUTANEOUS

## 2021-07-27 MED ORDER — ENOXAPARIN SODIUM 150 MG/ML IJ SOSY
150.0000 mg | PREFILLED_SYRINGE | INTRAMUSCULAR | Status: DC
  Filled 2021-07-27: qty 1

## 2021-07-27 NOTE — Procedures (Signed)
PROCEDURE SUMMARY:  Successful image-guided right thoracentesis. Yielded 600 cc of hazy amber fluid. Pt tolerated procedure well. No immediate complications. EBL = trace   Specimen was sent for labs. CXR ordered.  Please see imaging section of Epic for full dictation.  Iain Sawchuk H Vadis Slabach PA-C 07/27/2021 1:16 PM

## 2021-07-27 NOTE — Progress Notes (Signed)
Daily Progress Note   Patient Name: Angela Wilson       Date: 07/27/2021 DOB: 08-27-1966  Age: 55 y.o. MRN#: [id number] Attending Physician: Georgette Shell, MD Primary Care Physician: Just, Laurita Quint, FNP (Inactive) Admit Date: 07/04/2021  Reason for Consultation/Follow-up: Establishing goals of care  Subjective: Radiology is at bedside,Angela Wilson is a to undergo bedside thoracenteses, NG tube has been placed, daughter rehab at bedside.  We briefly reviewed the patient's clinical course and current condition.  Patient's son has received his visa approval however they are still waiting on patient's daughter to get an appointment as well.    Length of Stay: 12  Current Medications: Scheduled Meds:   chlorhexidine  15 mL Mouth Rinse BID   Chlorhexidine Gluconate Cloth  6 each Topical Daily   enoxaparin (LOVENOX) injection  150 mg Subcutaneous Daily   fentaNYL  1 patch Transdermal Q72H   insulin aspart  0-15 Units Subcutaneous Q4H   lidocaine       mouth rinse  15 mL Mouth Rinse q12n4p   mupirocin ointment  1 application Nasal BID   pantoprazole (PROTONIX) IV  40 mg Intravenous Q12H   scopolamine  1 patch Transdermal Q72H   sodium chloride flush  3 mL Intravenous Q12H    Continuous Infusions:  sodium chloride 10 mL/hr at 07/27/21 1200   ceFEPime (MAXIPIME) IV Stopped (07/27/21 1112)   metronidazole Stopped (07/27/21 0932)   potassium PHOSPHATE IVPB (in mmol) 64.4 mL/hr at 07/27/21 1200   promethazine (PHENERGAN) injection (IM or IVPB) 12.5 mg (07/24/21 1511)   TPN ADULT (ION)      PRN Meds: sodium chloride, acetaminophen **OR** acetaminophen, HYDROmorphone (DILAUDID) injection, lip balm, LORazepam, metoprolol tartrate, ondansetron **OR** ondansetron (ZOFRAN) IV, phenol,  prochlorperazine, promethazine (PHENERGAN) injection (IM or IVPB), sodium chloride flush  Physical Exam         Appears chronically ill fatigued Regular work of breathing Has O2 [place] Abdomen distended Has edema Now has NG tube Vital Signs: BP (!) 129/58   Pulse (!) 126   Temp 100 F (37.8 C)   Resp (!) 27   Ht 5\' 6"  (1.676 m)   Wt 94.1 kg   SpO2 99%   BMI 33.48 kg/m  SpO2: SpO2: 99 % O2 Device: O2 Device: Room Air O2 Flow Rate: O2 Flow  Rate (L/min): 0 L/min  Intake/output summary:  Intake/Output Summary (Last 24 hours) at 07/27/2021 1310 Last data filed at 07/27/2021 1200 Gross per 24 hour  Intake 1187.5 ml  Output 865 ml  Net 322.5 ml    LBM: Last BM Date: 07/27/21 Baseline Weight: Weight: 100.7 kg Most recent weight: Weight: 94.1 kg PPS 30%      Palliative Assessment/Data:      Patient Active Problem List   Diagnosis Date Noted   Intravascular volume depletion 07/18/2021   Internal jugular (IJ) vein thromboembolism, chronic, right ([place]) 07/16/2021   Goals of care, counseling/discussion 07/16/2021   Acute respiratory failure with hypoxia ([place]) 07/10/2021   Abdominal ascites 06/22/2021   Pleural effusion on right 06/26/2021   Hyponatremia 07/18/2021   Genetic testing 05/12/2020   Family history of breast cancer    Family history of ovarian cancer    Family history of kidney cancer    Family history of brain cancer    Essential hypertension, benign 04/06/2020   Pancreatic cancer ([place]) 02/14/2020   Cancer associated pain 02/13/2020   Stage IV adenocarcinoma of pancreas ([place]) 02/03/2020   Screening for colon cancer 01/07/2020   Encounter for screening mammogram for malignant neoplasm of breast 01/07/2020   Need for Tdap vaccination 01/07/2020   Pancreatic mass 12/30/2019   Nephrolithiasis 12/20/2012   GERD (gastroesophageal reflux disease) 12/20/2012   Type 2 diabetes mellitus ([place]) 11/09/2012    Palliative Care Assessment & Plan   Patient Profile:     Assessment:  55 yo lady with life limiting illness of pancreatic cancer, ascites, abdominal pain, LE edema.   Recommendations/Plan: Continue current care.  Remains full code/full scope  Continue efforts to assist with documentation outlining her medical illness to support her son and daughter's applications for visitation visa.   Goals of Care and Additional Recommendations: Limitations on Scope of Treatment: Full Scope Treatment  Code Status:    Code Status Orders  (From admission, onward)           Start     Ordered   07/07/2021 2022  Full code  Continuous        07/05/2021 2024           Code Status History     Date Active Date Inactive Code Status Order ID Comments User Context   02/14/2020 0213 02/18/2020 2347 Full Code [id number]  Clance Boll, MD Inpatient       Prognosis: Likely weeks at best, she continues to have decline overall and is at high risk for acute decompensation.  Discharge Planning: To Be Determined although I fear this will be a terminal admission.  Care plan was discussed with IDT.    Thank you for allowing the Palliative Medicine Team to assist in the care of this patient.   Total Time 25 Prolonged Time Billed  no   Greater than 50%  of this time was spent counseling and coordinating care related to the above assessment and plan.  Loistine Chance, MD  Please contact Palliative Medicine Team phone at [phone] for questions and concerns.

## 2021-07-27 NOTE — Progress Notes (Addendum)
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  She has undergone extensive treatment since May 2021.  Her cancer seems to have progressed since then.   Events in the last 7 days-patient continues to be tachycardic with a resting heart rate in the 130s to 140s with severe deconditioning and increasing in heart rate with any activity.  Her nausea and vomiting had subsided initially however she had not had any bowel movement so she was given MiraLAX Dulcolax and lactulose for 1 day.  She started having diarrhea and had large bowel movements and a rectal tube was placed at that point.  All the laxatives were stopped.  She received laxatives only 1 day.  Then she started to have intractable nausea vomiting NG tube was placed and she was moved to stepdown unit.  Stat CT of the abdomen and pelvis without contrast(she has severe contrast allergy) possible ischemic bowel and colonic ileus.  PCCM and general surgery was consulted. When she was moved to stepdown unit her respiratory rate was in the 40s and her heart rate was in the 150s. Dr. Barry Dienes saw the patient she did not think patient has ischemic bowel and even if she did have ischemic bowel she was not a candidate for surgery due to peritoneal carcinomatosis.  Her lactic acid was normal. PCCM followed the patient over the weekend and signed off on 07/25/2021. Patient also developed increasing right pleural effusion and increasing ascites. She received albumin infusion for 6 doses. Xarelto was stopped and heparin was started as there was some blood-tinged urine as well as NG tube drainage.  And she was n.p.o. due to colonic ileus and nausea vomiting with NG  tube in place. Her medications were narrowed down on 07/23/2021 to include only the ones she really needs it and was able to be converted to IV.  07/26/2021 2.9 L of fluid taken out paracentesis 07/27/2021 600 cc of hazy amber-colored right thoracentesis fluid sent for studies Patient started on TPN Heparin stopped Lovenox started Patient pulled out NG tube on 07/26/2021 this was replaced on 07/27/2021 as she again started with bilious vomiting  Daughter involved very involved with patient's care and patient remains full code in spite of multiple consultants PCCM oncology palliative care General surgery and hospitalist team explaining to her that her prognosis is extremely poor prognosis. Assessment & Plan:   Principal Problem:   Acute respiratory failure with hypoxia ([facility]) Active Problems:   Type 2 diabetes mellitus ([facility])   Stage IV adenocarcinoma of pancreas ([facility])   Essential hypertension, benign   Abdominal ascites   Pleural effusion on right   Hyponatremia   Internal jugular (IJ) vein thromboembolism, chronic, right ([facility])   Goals of care, counseling/discussion   Intravascular volume depletion  #1 stage IV adenocarcinoma of the pancreas diagnosed in May 2021 with mets to the peritoneum -patient has had multiple rounds of chemotherapy.  She is not a candidate for any chemotherapy at this time.  She was seen by Dr. Marin Olp.  Palliative care was consulted however due to religious believes patient's daughter believes that she is going to be healed.  We will continue discussions with the patient's daughter. CT abdomen 07/24/2021-large right effusion substantial ascites extensive peritoneal metastatic disease large cystic pelvic masses  pancreatic tail tumor and heterogenicity in the liver suspicious for hepatic metastatic lesions atelectasis in the left lower lobe right middle lobe and right lower lobe stable small pericardial effusion. Will consult IR for thoracentesis and paracentesis.  Patient  pulled out her NG tube 07/26/2021. KUB 07/26/2021 with worsening colonic ileus.  Daughter prefers not to put the NG tube back in.  However I have told her if she vomits we will have to put it back again.  #2 acute hypoxic respiratory failure due to large right pleural effusion status post right thoracentesis 07/27/2021.    #3 ascites status post paracentesis 11/7/202022 -2.9 later taken out.    #4 goals of care patient remains a full code with very poor prognosis.  #5 tachycardia-multifactorial I have started her on low-dose Lopressor.  Blood pressure stable.  Echo with normal ejection fraction.  #6 right IJ thrombus on Lovenox  #7  Fever no evidence of infection noted so far.  This is likely tumor fever.  Continue Toradol.  She was started on cefepime and Flagyl she got this for 5 days this is being stopped 07/27/2021.    #8 colonic ileus-NG tube pulled out by the patient on 07/26/2021.  KUB shows worsening colonic ileus.   NG tube placed back on 07/27/2021. Follow-up KUB in a.m. Unfortunately she is not able to ambulate or move much due to generalized deconditioning weakness and limitation due to tachycardia  #9 hypokalemia potassium 3.3 being repleted.  Her magnesium is 2.4.  Estimated body mass index is 33.48 kg/m as calculated from the following:   Height as of this encounter: 5\' 6"  (1.676 m).   Weight as of this encounter: 94.1 kg.  DVT prophylaxis: Heparin code Status: Full code Family Communication: Discussed with daughter at bedside disposition Plan:  Status is: Inpatient  Remains inpatient appropriate because: Tachycardic with stage IV pancreatic CA with mets  Consultants:  Oncology  Procedures: Paracentesis Antimicrobials: None  Subjective:  Daughter by the bedside.  Patient resting in bed still has a rectal tube with loose stools abdomen distended started to have some nausea since I left the room NG tube placed back Objective: Vitals:   07/27/21 1000 07/27/21 1100  07/27/21 1200 07/27/21 1300  BP: (!) 113/55  (!) 129/58 129/83  Pulse: (!) 147 (!) 131 (!) 126 (!) 135  Resp: (!) 24 (!) 25 (!) 27 19  Temp: 100.2 F (37.9 C) 100.2 F (37.9 C) 100 F (37.8 C) 99.9 F (37.7 C)  TempSrc:      SpO2: 100% 98% 99% 99%  Weight:      Height:        Intake/Output Summary (Last 24 hours) at 07/27/2021 1416 Last data filed at 07/27/2021 1200 Gross per 24 hour  Intake 1187.5 ml  Output 465 ml  Net 722.5 ml    Filed Weights   07/25/21 0500 07/26/21 0500 07/27/21 0500  Weight: 93.4 kg 93.8 kg 94.1 kg    Examination: Tachycardic  General exam: Appears uncomfortable and ill appearing Respiratory system: Diminished the bases to auscultation. Respiratory effort normal. Cardiovascular system: S1 & S2 heard, RRR. No JVD, murmurs, rubs, gallops or clicks.  Gastrointestinal system: Abdomen is distended, tender.  Hard palpable area around the umbilicus  No bowel sounds heard. Central nervous system: Drowsy  extremities: 1+ pitting edema  skin: No rashes, lesions or ulcers Psychiatry: Unable to assess  Data Reviewed: I have personally reviewed following labs and imaging studies  CBC: Recent Labs  Lab 07/21/21 0514  07/23/21 0345 07/24/21 0459 07/25/21 0832 07/26/21 0500 07/27/21 0431  WBC 4.0 3.2* 2.0* 2.1* 2.6* 4.6  NEUTROABS 2.7 2.1  --   --   --   --   HGB 10.2* 10.7* 11.4* 9.6* 8.6* 8.8*  HCT 34.3* 34.7* 36.9 31.0* 27.7* 28.1*  MCV 87.5 86.1 84.8 84.9 85.2 84.9  PLT 182 252 311 283 275 941    Basic Metabolic Panel: Recent Labs  Lab 07/21/21 0514 07/23/21 0345 07/24/21 0948 07/25/21 0832 07/26/21 0500 07/27/21 0431  NA 132* 133* 130* 131* 136 137  K 3.8 3.9 4.1 3.7 3.3* 3.3*  CL 98 98 97* 100 100 107  CO2 29 25 22  19* 20* 20*  GLUCOSE 261* 246* 302* 260* 245* 242*  BUN 17 31* 44* 49* 38* 27*  CREATININE 0.41* 0.46 0.66 0.61 0.47 0.44  CALCIUM 7.5* 7.9* 8.0* 7.8* 8.3* 7.8*  MG 1.9  --   --   --  2.2 2.2  PHOS  --   --   --   --    --  1.1*    GFR: Estimated Creatinine Clearance: 94 mL/min (by C-G formula based on SCr of 0.44 mg/dL). Liver Function Tests: Recent Labs  Lab 07/23/21 0345 07/24/21 0948 07/25/21 0832 07/26/21 0500 07/27/21 0431  AST 24 14* 15 13* 13*  ALT 29 20 16 16 13   ALKPHOS 57 65 63 54 53  BILITOT 0.7 0.9 1.2 1.5* 1.4*  PROT 5.6* 5.9* 5.7* 5.9* 5.4*  ALBUMIN 2.0* 2.0* 2.2* 2.9* 2.4*    No results for input(s): LIPASE, AMYLASE in the last 168 hours. No results for input(s): AMMONIA in the last 168 hours. Coagulation Profile: No results for input(s): INR, PROTIME in the last 168 hours.  Cardiac Enzymes: No results for input(s): CKTOTAL, CKMB, CKMBINDEX, TROPONINI in the last 168 hours. BNP (last 3 results) No results for input(s): PROBNP in the last 8760 hours. HbA1C: No results for input(s): HGBA1C in the last 72 hours. CBG: Recent Labs  Lab 07/26/21 2059 07/27/21 0018 07/27/21 0423 07/27/21 0801 07/27/21 1137  GLUCAP 177* 196* 228* 231* 203*    Lipid Profile: No results for input(s): CHOL, HDL, LDLCALC, TRIG, CHOLHDL, LDLDIRECT in the last 72 hours. Thyroid Function Tests: No results for input(s): TSH, T4TOTAL, FREET4, T3FREE, THYROIDAB in the last 72 hours. Anemia Panel: No results for input(s): VITAMINB12, FOLATE, FERRITIN, TIBC, IRON, RETICCTPCT in the last 72 hours. Sepsis Labs: Recent Labs  Lab 07/24/21 1949 07/24/21 2300 07/25/21 0200  LATICACIDVEN 1.7 1.4 1.4     Recent Results (from the past 240 hour(s))  Urine Culture     Status: Abnormal   Collection Time: 07/20/21 12:29 PM   Specimen: Urine, Catheterized  Result Value Ref Range Status   Specimen Description   Final    URINE, CATHETERIZED Performed at [facility] [address], [place], [place] [address]    Special Requests   Final    Immunocompromised Performed at [facility], [place] [address] [place], [place] [address]    Culture (A)  Final    <10,000  COLONIES/mL INSIGNIFICANT GROWTH Performed at [facility] [address], [place],  [address]    Report Status 07/21/2021 FINAL  Final  MRSA Next Gen by PCR, Nasal     Status: Abnormal   Collection Time: 07/24/21  5:58 PM   Specimen: Nasal Mucosa; Nasal Swab  Result Value Ref Range Status   MRSA by PCR Next Gen DETECTED (  A) NOT DETECTED Final    Comment: RESULT CALLED TO, READ BACK BY AND VERIFIED WITH: CALDERON,E RN @2141  ON 07/24/21 JACKSON,K (NOTE) The GeneXpert MRSA Assay (FDA approved for NASAL specimens only), is one component of a comprehensive MRSA colonization surveillance program. It is not intended to diagnose MRSA infection nor to guide or monitor treatment for MRSA infections. Test performance is not FDA approved in patients less than 56 years old. Performed at [facility], [place] [address], [place], [place] [address]   Culture, blood (routine x 2)     Status: None (Preliminary result)   Collection Time: 07/24/21  7:57 PM   Specimen: BLOOD  Result Value Ref Range Status   Specimen Description   Final    BLOOD BLOOD LEFT HAND Performed at [facility] [address], [place], [place] [address]    Special Requests   Final    BOTTLES DRAWN AEROBIC ONLY Blood Culture adequate volume Performed at [facility] [address] [place], [place] [address]    Culture   Final    NO GROWTH 3 DAYS Performed at [facility], [place] [address], [place], [place] [address]    Report Status PENDING  Incomplete  Culture, blood (routine x 2)     Status: None (Preliminary result)   Collection Time: 07/24/21  7:57 PM   Specimen: BLOOD  Result Value Ref Range Status   Specimen Description   Final    BLOOD LEFT WRIST Performed at [facility] [address] [place], [place] [address]    Special Requests   Final    BOTTLES DRAWN AEROBIC ONLY Blood Culture adequate volume Performed at  [facility] [address], [place], [place] [address]    Culture   Final    NO GROWTH 3 DAYS Performed at [facility], [place] [address] [place], [place] [address]    Report Status PENDING  Incomplete  Urine Culture     Status: Abnormal   Collection Time: 07/25/21  1:36 AM   Specimen: In/Out Cath Urine  Result Value Ref Range Status   Specimen Description   Final    IN/OUT CATH URINE Performed at [facility] [address], [place], [place] [address]    Special Requests   Final    NONE Performed at [facility], [place] [address] [place], [place] [address]    Culture MULTIPLE SPECIES PRESENT, SUGGEST RECOLLECTION (A)  Final   Report Status 07/26/2021 FINAL  Final  Culture, body fluid w Gram Stain-bottle     Status: None (Preliminary result)   Collection Time: 07/26/21  2:29 PM   Specimen: Fluid  Result Value Ref Range Status   Specimen Description FLUID PERITONEAL  Final   Special Requests BOTTLES DRAWN AEROBIC AND ANAEROBIC  Final   Culture   Final    NO GROWTH < 12 HOURS Performed at [facility], [place] [address] [place],  [address]    Report Status PENDING  Incomplete  Gram stain     Status: None (Preliminary result)   Collection Time: 07/26/21  2:29 PM   Specimen: Fluid  Result Value Ref Range Status   Specimen Description FLUID PERITONEAL  Final   Special Requests NONE  Final   Gram Stain   Final    NO SQUAMOUS EPITHELIAL CELLS SEEN MODERATE WBC SEEN NO ORGANISMS SEEN Performed at [facility], [address], [place], [place] [address]    Report Status PENDING  Incomplete          Radiology Studies: DG Abd 1 View  Result Date: 07/27/2021 CLINICAL DATA:  NG tube placement EXAM: ABDOMEN - 1 VIEW COMPARISON:  None. FINDINGS: Nasogastric tube with the tip projecting over the stomach. Gaseous distension the small bowel and colon. No evidence of pneumoperitoneum, portal venous  gas or pneumatosis. No pathologic calcifications along the expected course of the ureters. No acute osseous abnormality. IMPRESSION: Nasogastric tube with the tip projecting over the stomach. Electronically Signed   By: Kathreen Devoid M.D.   On: 07/27/2021 11:22   DG Abd 1 View  Result Date: 07/27/2021 CLINICAL DATA:  Abdominal distention, vomiting EXAM: ABDOMEN - 1 VIEW COMPARISON:  07/26/2021 FINDINGS: There are dilated bowel loops in the mid abdomen measuring up to 11.5 cm in maximum diameter. This finding is not changed significantly. There is haziness in the abdomen and pelvis, possibly suggesting ascites. IMPRESSION: There is dilation of bowel loops in mid abdomen suggesting ileus or partial obstruction. No significant interval changes are noted. Electronically Signed   By: Elmer Picker M.D.   On: 07/27/2021 08:06   US Paracentesis  Result Date: 07/26/2021 INDICATION: Patient with history of pancreatic cancer, recurrent ascites. Request to IR for diagnostic and therapeutic paracentesis. EXAM: ULTRASOUND GUIDED DIAGNOSTIC AND THERAPEUTIC PARACENTESIS MEDICATIONS: 10 mL 1% lidocaine COMPLICATIONS: None immediate. PROCEDURE: Informed written consent was obtained from the patient after a discussion of the risks, benefits and alternatives to treatment. A timeout was performed prior to the initiation of the procedure. Initial ultrasound scanning demonstrates a moderate amount of ascites within the left lower abdominal quadrant. The left lower abdomen was prepped and draped in the usual sterile fashion. 1% lidocaine was used for local anesthesia. Following this, a 19 gauge, 7-cm, Yueh catheter was introduced. An ultrasound image was saved for documentation purposes. The paracentesis was performed. The catheter was removed and a dressing was applied. The patient tolerated the procedure well without immediate post procedural complication. FINDINGS: A total of approximately 2.9 L of hazy yellow fluid was  removed. Samples were sent to the laboratory as requested by the clinical team. IMPRESSION: Successful ultrasound-guided paracentesis yielding 2.9 liters of peritoneal fluid. Read by Candiss Norse, PA-C Electronically Signed   By: Michaelle Birks M.D.   On: 07/26/2021 18:32   DG CHEST PORT 1 VIEW  Result Date: 07/27/2021 CLINICAL DATA:  Pleural effusion. Additional history provided: Pleural effusions status post thoracentesis. EXAM: PORTABLE CHEST 1 VIEW COMPARISON:  Prior chest radiographs 07/24/2021 and earlier. FINDINGS: Right chest infusion port catheter with tip projecting at the level of the superior cavoatrial junction. An enteric tube is present, extending below the level of the left hemidiaphragm with tip excluded from the field of view. The cardiomediastinal silhouette is unchanged. No significant right pleural effusion is appreciated status post thoracentesis. A small left pleural effusion is unchanged. Mild bibasilar atelectasis. No evidence of pneumothorax. No acute bony abnormality identified. IMPRESSION: No significant residual right pleural effusion is appreciated status post thoracentesis. No evidence of pneumothorax. Persistent small left pleural effusion. Mild bibasilar atelectasis. Electronically Signed   By: Kellie Simmering D.O.   On: 07/27/2021 13:54   DG Abd Portable 1V  Result Date: 07/26/2021 CLINICAL DATA:  Follow-up ileus EXAM: PORTABLE ABDOMEN - 1 VIEW COMPARISON:  Abdominal x-ray 07/24/2021 FINDINGS: Enteric tube tip is in the stomach. Redemonstration of abnormally distended gas-filled loops of bowel mostly in the  mid abdomen which measure up to 8.4 cm in diameter and appear mildly increased diameter since previous study. Paucity of bowel gas in the pelvis. No suspicious calcifications visualized. IMPRESSION: Mildly increased distension of abnormal gas-filled loops of bowel in the mid abdomen since previous study. May represent obstruction or ileus. Electronically Signed   By:  Ofilia Neas M.D.   On: 07/26/2021 08:30        Scheduled Meds:  chlorhexidine  15 mL Mouth Rinse BID   Chlorhexidine Gluconate Cloth  6 each Topical Daily   enoxaparin (LOVENOX) injection  150 mg Subcutaneous Daily   fentaNYL  1 patch Transdermal Q72H   insulin aspart  0-15 Units Subcutaneous Q4H   mouth rinse  15 mL Mouth Rinse q12n4p   mupirocin ointment  1 application Nasal BID   pantoprazole (PROTONIX) IV  40 mg Intravenous Q12H   scopolamine  1 patch Transdermal Q72H   sodium chloride flush  3 mL Intravenous Q12H   Continuous Infusions:  sodium chloride 10 mL/hr at 07/27/21 1200   potassium PHOSPHATE IVPB (in mmol) 64.4 mL/hr at 07/27/21 1200   promethazine (PHENERGAN) injection (IM or IVPB) 12.5 mg (07/24/21 1511)   TPN ADULT (ION)       LOS: 12 days    Georgette Shell, MD 07/27/2021, 2:16 PM

## 2021-07-27 NOTE — Progress Notes (Signed)
Overall, he is really not changed for Ms. Merle.  She did have a paracentesis done yesterday.  2.9 L of fluid taken off.  She had abdominal x-rays taken.  There is no change in the ileus.  I told her daughter that we could certainly try to give her some IV nutrition to see if this may help.  I really not sure how much this is really going to do but at least it is certainly something that we could try.  I think this would be amenable to their religious believes.  She is on heparin.  We will switch her over to Lovenox.  Her labs show white count of 4.6.  Hemoglobin 8.8.  Platelet count 276,000.  Her BUN is 27 creatinine 0.44.  Her potassium is 3.3.  Her calcium is 7.8 with an albumin of 2.4.  She has had negative cultures..  She still having some diarrhea.  Overall, I am not sure how much more we are really gaining with her.  Again, I understand this is being driven by her religious believes.  I have to respect this.  Her vital signs show temperature of 99.9.  Pulse 124.  Blood pressure 114/51.  Her lungs still sound relatively clear bilaterally.  There may be some decrease over on the right side.  Cardiac exam tachycardic but regular.  Abdomen is somewhat distended.  I still cannot hear any bowel sounds.  There is no guarding.  Extremity shows no clubbing, cyanosis or edema.  For right now, will see about starting some TPN on her.  Again, I am not sure if this really is going to make a difference.  However, I do want to try to respect their wishes and there customs.  I know the ICU staff are doing a fantastic job with her.  I really would appreciate all their compassion.  Lattie Haw, MD  Lurena Joiner 1:37

## 2021-07-27 NOTE — Progress Notes (Signed)
Nutrition Follow-up  DOCUMENTATION CODES:   Non-severe (moderate) malnutrition in context of chronic illness, Obesity unspecified  INTERVENTION:  - TPN initiation and advancement per Pharmacist. - diet advancement as medically feasible.  - recommend 100 mg IV thiamine/day for patient at risk of refeeding with TPN initiation.   Monitor magnesium, potassium, and phosphorus BID for at least 3 days, MD to replete as needed, as pt is at risk for refeeding syndrome given moderate malnutrition, overall poor PO intake for >/= 2 weeks, current hypokalemia and hypophosphatemia.    NUTRITION DIAGNOSIS:   Moderate Malnutrition related to chronic illness, cancer and cancer related treatments as evidenced by mild fat depletion, mild muscle depletion. -revised, ongoing  GOAL:   Patient will meet greater than or equal to 90% of their needs -unmet, unable to meet at this time.   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 She presented to the ED with abdominal pain and N/V. Patient was diagnosed with pancreatic cancer in 01/2020, followed by Dr. Marin Olp, and has undergone extensive treatment. Patient with decreased appetite, generalized edema, and recurrent ascites.  She has been NPO since 11/5. Paracentesis yesterday with 2.9 L hazy yellow fluid removed. NGT placed today at 1100 d/t nausea and had some output in the tubing at the time of RD visit; nothing in canister yet.  Patient's daughter was at bedside. Bedside thoracentesis about to begin when RD left patient's room (now completed and 600 ml hazy amber fluid removed).   She has an implanted R chest port from 02/18/20 which can be utilized for TPN. Order in place for TPN to start at 40  ml/hr today at 1800.   Weight has been stable over the past 9 days. Deep pitting edema noted during NFPE.    Labs reviewed; CBGs: 196, 228, 231, 203 mg/dl, BUN: 27 mg/dl, Ca: 7.8 mg/dl, K: 3.3 mmol/l, Phos: 1.1 mg/dl, Mg WDL at 2.2 mg/dl.   Medications reviewed; sliding scale novolog, 40 mg IV protonix BID, 10 mEq IV KCl x3 runs 11/7 and x3 runs 11/8, 45 ml IV KPhos x1 run 11/8.     NUTRITION - FOCUSED PHYSICAL EXAM:  Flowsheet Row Most Recent Value  Orbital Region Mild depletion  Upper Arm Region Mild depletion  Thoracic and Lumbar Region Unable to assess  Buccal Region Mild depletion  Temple Region Mild depletion  Clavicle Bone Region Mild depletion  Clavicle and Acromion Bone Region Mild depletion  Scapular Bone Region Unable to assess  Dorsal Hand Unable to assess  [mittens]  Patellar Region No depletion  Anterior Thigh Region No depletion  Posterior Calf Region No depletion  Edema (RD Assessment) Severe  [BLE and RUE]  Hair Unable to assess  Eyes Reviewed  Mouth Unable to assess  Skin Reviewed  Nails Unable to assess       Diet Order:   Diet Order             Diet NPO time specified  Diet effective now                   EDUCATION NEEDS:   Education needs have been addressed  Skin:  Skin Assessment: Reviewed RN Assessment  Last BM:  11/7 (type 7)  Height:  Ht Readings from Last 1 Encounters:  07/12/2021 5\' 6"  (1.676 m)    Weight:   Wt Readings from Last 1 Encounters:  07/27/21 94.1 kg     Estimated Nutritional Needs:  Kcal:  2100-2300 kcal Protein:  110-125 grams Fluid:  >/= 1.6 L/day     Angela Matin, MS, RD, LDN, CNSC Inpatient Clinical Dietitian RD pager # available in [facility]  After hours/weekend pager # available in [facility]

## 2021-07-27 NOTE — Progress Notes (Signed)
[place] for IV heparin Indication: right IJ thrombus   Allergies  Allergen Reactions   Ivp Dye [Iodinated Diagnostic Agents] Itching and Rash   Morphine And Related Hives   Penicillins Rash    Patient Measurements: Height: 5\' 6"  (167.6 cm) Weight: 94.1 kg (207 lb 7.3 oz) IBW/kg (Calculated) : 59.3 Heparin Dosing Weight: 80.6 kg  Vital Signs: Temp: 99.9 F (37.7 C) (11/08 0600) Temp Source: Core (Comment) (11/08 0400) BP: 114/51 (11/08 0400) Pulse Rate: 124 (11/08 0600)  Labs: Recent Labs    07/24/21 2130 07/25/21 0400 07/25/21 0440 07/25/21 0832 07/25/21 1706 07/26/21 0500 07/27/21 0431  HGB  --   --    < > 9.6*  --  8.6* 8.8*  HCT  --   --   --  31.0*  --  27.7* 28.1*  PLT  --   --   --  283  --  275 276  APTT 27 108*  --   --   --   --   --   HEPARINUNFRC 0.61  --    < >  --  0.47 0.30 0.22*  CREATININE  --   --   --  0.61  --  0.47 0.44   < > = values in this interval not displayed.    Estimated Creatinine Clearance: 94 mL/min (by C-G formula based on SCr of 0.44 mg/dL).  Medications:   ceFEPime (MAXIPIME) IV Stopped (07/27/21 0330)   metronidazole Stopped (07/26/21 2206)   potassium chloride 100 mL/hr at 07/27/21 8527   promethazine (PHENERGAN) injection (IM or IVPB) 12.5 mg (07/24/21 1511)      Assessment: 65 y/oF with PMH of stage IV pancreatic cancer with peritoneal carcinomatosis, right IJ thrombus on Xarelto who presented with abdominal pain and n/v. CT revealed portal venous gas, distended colon, concern for bowel ischemia. Patient was made NPO and IV heparin was initiated while Xarelto on hold.  Dr Marin Olp is now changing from Heparin to Lovenox.    Today. 07/27/2021: Heparin level 0.22 (subtherapeutic) on heparin 1200 units/hr CBC:  Hgb 8.8 Pltc wnl .  Per Dr. Marin Olp, may be related to recent chemotherapy, no bleeding reported.   SCr remains low, 0.44 with CrCl ~ 94 ml/min.    Goal of Therapy:  Anti-Xa  level 0.6-1 units/ml 4hrs after LMWH dose given Monitor platelets by anticoagulation protocol: Yes    Plan:  Lovenox 1.5 mg/kg ordered, dose rounded to 150mg  for available syringe size. Pharmacy to sign off heparin protocol.   Gretta Arab PharmD, BCPS Clinical Pharmacist [facility] main pharmacy [phone] 07/27/2021 7:11 AM

## 2021-07-27 NOTE — Progress Notes (Signed)
ANTICOAGULATION CONSULT NOTE   Pharmacy Consult for IV heparin Indication: right IJ thrombus   Allergies  Allergen Reactions   Ivp Dye [Iodinated Diagnostic Agents] Itching and Rash   Morphine And Related Hives   Penicillins Rash    Patient Measurements: Height: 5\' 6"  (167.6 cm) Weight: 93.8 kg (206 lb 12.7 oz) IBW/kg (Calculated) : 59.3 Heparin Dosing Weight: 80.6 kg  Vital Signs: Temp: 99.5 F (37.5 C) (11/08 0300) BP: 91/55 (11/08 0300) Pulse Rate: 116 (11/08 0300)  Labs: Recent Labs    07/24/21 2130 07/25/21 0400 07/25/21 0440 07/25/21 0832 07/25/21 1706 07/26/21 0500 07/27/21 0431  HGB  --   --    < > 9.6*  --  8.6* 8.8*  HCT  --   --   --  31.0*  --  27.7* 28.1*  PLT  --   --   --  283  --  275 276  APTT 27 108*  --   --   --   --   --   HEPARINUNFRC 0.61  --    < >  --  0.47 0.30 0.22*  CREATININE  --   --   --  0.61  --  0.47 0.44   < > = values in this interval not displayed.    Estimated Creatinine Clearance: 93.8 mL/min (by C-G formula based on SCr of 0.44 mg/dL).  Medications:   ceFEPime (MAXIPIME) IV Stopped (07/27/21 0330)   heparin 1,200 Units/hr (07/27/21 5859)   metronidazole Stopped (07/26/21 2206)   potassium chloride     promethazine (PHENERGAN) injection (IM or IVPB) 12.5 mg (07/24/21 1511)      Assessment: 89 y/oF with PMH of stage IV pancreatic cancer with peritoneal carcinomatosis, right IJ thrombus on Xarelto who presented with abdominal pain and n/v. CT A/P today revealed portal venous gas, distended colon, concern for bowel ischemia. Patient made NPO. Pharmacy consulted for IV heparin dosing while Xarelto on hold. Patient had previously been on Xarelto 20mg  PO daily, but had recently been reduced to 10mg  PO daily on 07/21/21 by Oncology. Last dose of Xarelto 10mg  PO was 07/23/21 at 1639. Note that recent Xarelto use can falsely elevate heparin level.   Today. 07/27/2021: Heparin level 0.22 (subtherapeutic) on heparin 1200  units/hr CBC:  Hgb 8.8 Pltc wnl .  Per Dr. Marin Olp, may be related to recent chemotherapy, but not bleeding reported.   No interruptions in therapy, no line issues, and no bleeding complications noted by RN   Goal of Therapy:  Heparin level 0.3-0.7 units/ml aPTT 66-102 seconds Monitor platelets by anticoagulation protocol: Yes    Plan:  Increase Heparin infusion to 1350 units/hr Check heparin level 6 hr after rate increase Daily CBC, heparin level Monitor closely for s/sx of bleeding   Leone Haven, PharmD 07/27/2021 5:45 AM

## 2021-07-27 NOTE — Progress Notes (Signed)
PHARMACY - TOTAL PARENTERAL NUTRITION CONSULT NOTE   Indication: Prolonged ileus  Patient Measurements: Height: 5\' 6"  (167.6 cm) Weight: 94.1 kg (207 lb 7.3 oz) IBW/kg (Calculated) : 59.3 TPN AdjBW (KG): 69.6 Body mass index is 33.48 kg/m. Usual Weight:   Assessment: 38 yoF admitted on 10/27 with N/V, abdominal pain.  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  She has had poor PO intake, NPO since 11/5 d/t colonic ileus.  NG tube removed by pt on 11/7, replaced on 11/8.  She is high risk for re-feeding.  GOC:  Due to religious beliefs, patient and family continue to want full scope of care without consideration of scaling back treatment in efforts to focus on comfort.  Glucose / Insulin: Hx of diabetes, not on any medications PTA.  Most recent A1c 8.6% (01/2020) - CBGs elevated 177-231 (No recent steroids; dexamethasone 10mg  IV given 07/13/21) - SSI:  8 units/ 24 hrs Electrolytes: HypoK, HypoPhos, CO2 low.  Others WNL including CorrCa 9.08 Renal: SCr low/stable.  BUN decreased to 27 Hepatic: AST/ALT low/stable, Tbili 1.4 Intake / Output; MIVF: none.  UOP 715 ml; Fecal management system in place. GI Imaging:  11/5 CT: extensive peritoneal metastatic disease, large cystic pelvic masses, pancreatic tail tumor, and some heterogeneity in the liver suspicious for hepatic metastatic lesions.  Central access: Port TPN start date: 11/8  Nutritional Goals: Goal TPN rate is 90 mL/hr (provides 121 g of protein and 2160 kcals per day)  RD Assessment:  (11/8) Estimated Needs Total Energy Estimated Needs: 2100-2300 kcal Total Protein Estimated Needs: 110-125 grams Total Fluid Estimated Needs: >/= 1.6 L/day  Current Nutrition:  NPO  Plan:  Now:  KCl 10 mEq IV x3 runs per Elink - KPhos 45 mmol IV x1 (to provide additional K 66 mEq)  Start TPN at 40 mL/hr  at 1800 Electrolytes in TPN: Na 33mEq/L, K 87mEq/L, Ca 7mEq/L, Mg 70mEq/L, and Phos 65mmol/L. Cl:Ac - Max Acetate Standard MVI and trace elements in TPN Moderate SSI q4h and adjust as needed  IVF per MD (none) Monitor TPN labs on Mon/Thurs.   Gretta Arab PharmD, BCPS Clinical Pharmacist [facility] main pharmacy [phone] 07/27/2021 10:21 AM

## 2021-07-28 ENCOUNTER — Inpatient Hospital Stay: Payer: 59

## 2021-07-28 ENCOUNTER — Inpatient Hospital Stay: Payer: 59 | Admitting: Hematology & Oncology

## 2021-07-28 DIAGNOSIS — E440 Moderate protein-calorie malnutrition: Secondary | ICD-10-CM | POA: Diagnosis present

## 2021-07-28 DIAGNOSIS — I82C21 Chronic embolism and thrombosis of right internal jugular vein: Secondary | ICD-10-CM

## 2021-07-28 DIAGNOSIS — J9601 Acute respiratory failure with hypoxia: Secondary | ICD-10-CM | POA: Diagnosis not present

## 2021-07-28 DIAGNOSIS — E876 Hypokalemia: Secondary | ICD-10-CM

## 2021-07-28 DIAGNOSIS — C250 Malignant neoplasm of head of pancreas: Secondary | ICD-10-CM

## 2021-07-28 DIAGNOSIS — K567 Ileus, unspecified: Secondary | ICD-10-CM | POA: Diagnosis present

## 2021-07-28 LAB — COMPREHENSIVE METABOLIC PANEL
ALT: 12 U/L (ref 0–44)
AST: 11 U/L — ABNORMAL LOW (ref 15–41)
Albumin: 2.2 g/dL — ABNORMAL LOW (ref 3.5–5.0)
Alkaline Phosphatase: 52 U/L (ref 38–126)
Anion gap: 6 (ref 5–15)
BUN: 23 mg/dL — ABNORMAL HIGH (ref 6–20)
CO2: 24 mmol/L (ref 22–32)
Calcium: 7.7 mg/dL — ABNORMAL LOW (ref 8.9–10.3)
Chloride: 109 mmol/L (ref 98–111)
Creatinine, Ser: <0.3 mg/dL — ABNORMAL LOW (ref 0.44–1.00)
Glucose, Bld: 289 mg/dL — ABNORMAL HIGH (ref 70–99)
Potassium: 3.8 mmol/L (ref 3.5–5.1)
Sodium: 139 mmol/L (ref 135–145)
Total Bilirubin: 0.7 mg/dL (ref 0.3–1.2)
Total Protein: 5.4 g/dL — ABNORMAL LOW (ref 6.5–8.1)

## 2021-07-28 LAB — CBC
HCT: 29.1 % — ABNORMAL LOW (ref 36.0–46.0)
Hemoglobin: 9 g/dL — ABNORMAL LOW (ref 12.0–15.0)
MCH: 26.5 pg (ref 26.0–34.0)
MCHC: 30.9 g/dL (ref 30.0–36.0)
MCV: 85.6 fL (ref 80.0–100.0)
Platelets: 288 10*3/uL (ref 150–400)
RBC: 3.4 MIL/uL — ABNORMAL LOW (ref 3.87–5.11)
RDW: 19.7 % — ABNORMAL HIGH (ref 11.5–15.5)
WBC: 6.3 10*3/uL (ref 4.0–10.5)
nRBC: 1.3 % — ABNORMAL HIGH (ref 0.0–0.2)

## 2021-07-28 LAB — GLUCOSE, CAPILLARY
Glucose-Capillary: 217 mg/dL — ABNORMAL HIGH (ref 70–99)
Glucose-Capillary: 257 mg/dL — ABNORMAL HIGH (ref 70–99)
Glucose-Capillary: 241 mg/dL — ABNORMAL HIGH (ref 70–99)
Glucose-Capillary: 206 mg/dL — ABNORMAL HIGH (ref 70–99)
Glucose-Capillary: 174 mg/dL — ABNORMAL HIGH (ref 70–99)
Glucose-Capillary: 157 mg/dL — ABNORMAL HIGH (ref 70–99)

## 2021-07-28 LAB — PHOSPHORUS: Phosphorus: 1.7 mg/dL — ABNORMAL LOW (ref 2.5–4.6)

## 2021-07-28 LAB — TRIGLYCERIDES: Triglycerides: 127 mg/dL (ref <150)

## 2021-07-28 LAB — MAGNESIUM: Magnesium: 2.1 mg/dL (ref 1.7–2.4)

## 2021-07-28 LAB — GRAM STAIN

## 2021-07-28 LAB — CYTOLOGY - NON PAP

## 2021-07-28 MED ORDER — THIAMINE HCL 100 MG/ML IJ SOLN
100.0000 mg | Freq: Once | INTRAMUSCULAR | Status: AC
  Administered 2021-07-28: 100 mg via INTRAVENOUS
  Filled 2021-07-28: qty 2

## 2021-07-28 MED ORDER — INSULIN ASPART 100 UNIT/ML IJ SOLN
0.0000 [IU] | INTRAMUSCULAR | Status: DC
  Administered 2021-07-28: 4 [IU] via SUBCUTANEOUS
  Administered 2021-07-28 – 2021-07-29 (×3): 7 [IU] via SUBCUTANEOUS
  Administered 2021-07-29: 4 [IU] via SUBCUTANEOUS
  Administered 2021-07-29: 15 [IU] via SUBCUTANEOUS
  Administered 2021-07-29: 7 [IU] via SUBCUTANEOUS
  Administered 2021-07-29: 4 [IU] via SUBCUTANEOUS
  Administered 2021-07-29: 11 [IU] via SUBCUTANEOUS
  Administered 2021-07-29 – 2021-07-30 (×2): 7 [IU] via SUBCUTANEOUS
  Administered 2021-07-30: 3 [IU] via SUBCUTANEOUS
  Administered 2021-07-30: 4 [IU] via SUBCUTANEOUS
  Administered 2021-07-30: 7 [IU] via SUBCUTANEOUS
  Administered 2021-07-31 – 2021-08-03 (×7): 3 [IU] via SUBCUTANEOUS
  Administered 2021-08-03 (×2): 4 [IU] via SUBCUTANEOUS
  Administered 2021-08-04: 7 [IU] via SUBCUTANEOUS

## 2021-07-28 MED ORDER — METOPROLOL TARTRATE 5 MG/5ML IV SOLN
5.0000 mg | Freq: Once | INTRAVENOUS | Status: AC
  Administered 2021-07-28: 5 mg via INTRAVENOUS
  Filled 2021-07-28: qty 5

## 2021-07-28 MED ORDER — METOPROLOL TARTRATE 5 MG/5ML IV SOLN
2.5000 mg | Freq: Once | INTRAVENOUS | Status: AC
  Administered 2021-07-28: 2.5 mg via INTRAVENOUS
  Filled 2021-07-28: qty 5

## 2021-07-28 MED ORDER — POTASSIUM PHOSPHATES 15 MMOLE/5ML IV SOLN
30.0000 mmol | Freq: Once | INTRAVENOUS | Status: AC
  Administered 2021-07-28: 30 mmol via INTRAVENOUS
  Filled 2021-07-28: qty 10

## 2021-07-28 MED ORDER — TRAVASOL 10 % IV SOLN
INTRAVENOUS | Status: AC
  Filled 2021-07-28: qty 806.4

## 2021-07-28 MED ORDER — INSULIN GLARGINE-YFGN 100 UNIT/ML ~~LOC~~ SOLN
8.0000 [IU] | Freq: Every day | SUBCUTANEOUS | Status: DC
  Filled 2021-07-28: qty 0.08

## 2021-07-28 MED ORDER — INSULIN ASPART 100 UNIT/ML IJ SOLN
3.0000 [IU] | INTRAMUSCULAR | Status: AC
  Administered 2021-07-28 (×3): 3 [IU] via SUBCUTANEOUS

## 2021-07-28 NOTE — Progress Notes (Signed)
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  She has undergone extensive treatment since May 2021.  Her cancer seems to have progressed since then.    Events in the last 7 days-patient continues to be tachycardic with a resting heart rate in the 130s to 140s with severe deconditioning and increasing in heart rate with any activity.  Her nausea and vomiting had subsided initially however she had not had any bowel movement so she was given MiraLAX Dulcolax and lactulose for 1 day.  She started having diarrhea and had large bowel movements and a rectal tube was placed at that point.  All the laxatives were stopped.  She received laxatives only 1 day.  Then she started to have intractable nausea vomiting NG tube was placed and she was moved to stepdown unit.  Stat CT of the abdomen and pelvis without contrast(she has severe contrast allergy) possible ischemic bowel and colonic ileus.  PCCM and general surgery was consulted. When she was moved to stepdown unit her respiratory rate was in the 40s and her heart rate was in the 150s. Dr. Barry Dienes saw the patient she did not think patient has ischemic bowel and even if she did have ischemic bowel she was not a candidate for surgery due to peritoneal carcinomatosis.  Her lactic acid was normal. PCCM followed the patient over the weekend and signed off on 07/25/2021. Patient also developed increasing right pleural effusion and increasing ascites. She received albumin infusion for 6 doses. Xarelto was stopped and heparin was started as there was some blood-tinged urine as well as NG tube  drainage.  And she was n.p.o. due to colonic ileus and nausea vomiting with NG tube in place. Her medications were narrowed down on 07/23/2021 to include only the ones she really needs it and was able to be converted to IV.   07/26/2021 2.9 L of fluid taken out paracentesis 07/27/2021 600 cc of hazy amber-colored right thoracentesis fluid sent for studies Patient started on TPN Heparin stopped Lovenox started Patient pulled out NG tube on 07/26/2021 this was replaced on 07/27/2021 as she again started with bilious vomiting   Daughter involved very involved with patient's care and patient remains full code in spite of multiple consultants PCCM oncology palliative care General surgery and hospitalist team explaining to her that her prognosis is extremely poor prognosis.   Assessment & Plan:   Principal Problem:   Acute respiratory failure with hypoxia (HCC) Active Problems:   Type 2 diabetes mellitus (HCC)   Stage IV adenocarcinoma of pancreas (HCC)   Essential hypertension, benign   Abdominal ascites   Pleural effusion on right   Hyponatremia   Internal jugular (IJ) vein thromboembolism, chronic, right (HCC)   Goals of care, counseling/discussion   Intravascular volume depletion   Malnutrition of moderate degree  #1 acute hypoxemic respiratory failure secondary to large right pleural effusion status postthoracentesis 07/27/2021 -Improved.  Also in part secondary to poor diaphragmatic excursion with massive abdominal distention. -NG tube placed, status post thoracenteses. -Currently with sats of 100% on room air.  2.  Stage IV adenocarcinoma of the pancreas diagnosed 2021 with mets to  the peritoneum, liver. -Patient undergone multiple rounds of chemotherapy and radiation. -Patient with progression of disease. -Patient being followed by oncology Dr. Marin Olp and patient deemed not a candidate for any chemotherapy at this time. -CT abdomen 07/24/2021-large right effusion substantial ascites  extensive peritoneal metastatic disease large cystic pelvic masses pancreatic tail tumor and heterogenicity in the liver suspicious for hepatic metastatic lesions atelectasis in the left lower lobe right middle lobe and right lower lobe stable small pericardial effusion. -Patient status post paracentesis and thoracentesis. -Patient noted to have pulled out NG tube 07/26/2021 and KUB done with worsening colonic ileus and as such NG tube placed back in as patient did have ongoing nausea and emesis. -Palliative care consulted however due to religious believes patient's daughter believes patient is going to be healed and recommended ongoing aggressive treatment at this time.  3.  Malignant ascites -Status post paracentesis 07/26/2021 with cytology consistent with malignant cells consistent with metastatic adenocarcinoma. -2.9 L of fluid removed. -Oncology following  4.  Sinus tachycardia -Likely multifocal secondary to acute illnesses including progressive stage IV adenocarcinoma of the pancreas, colonic ileus, pleural effusion. -2D echo done with normal EF. -Continue IV Lopressor as needed. -May need to place on scheduled Lopressor however will monitor and follow.  5.  Right IJ thrombus -Continue full dose Lovenox.  6.  Fever -Likely tumor fever. -Status post course of cefepime and Flagyl.  7.  Colonic ileus -Likely secondary to stage IV adenocarcinoma of the pancreas with peritoneal mets in the setting of opioid pain medication. -Patient noted to have pulled out NG tube 07/26/2021 with KUB showing worsening colonic ileus. -NG tube placed back in 07/27/2021 with a output of 200 cc over the past 24 hours. -If output continues to decrease may consider clamping trial. -Patient with rectal tube with loose stools noted. -Patient unable to ambulate or move much due to generalized deconditioning/weakness and limitation due to tachycardia. -PT/OT.  8.   Hypokalemia/hypophosphatemia/hypomagnesemia -Patient on TPN, being repleted per pharmacy.  9.  Moderate protein calorie malnutrition -On TPN. -Thiamine been added to TPN per RD recommendations due to concern for refeeding syndrome.  10.  Hyperglycemia -Likely secondary to TPN. -Being managed per pharmacy.   DVT prophylaxis: Lovenox Code Status: Full Family Communication: Updated daughter at bedside. Disposition:   Status is: Inpatient  Remains inpatient appropriate because: Current ongoing inpatient care.  Patient with ileus versus small bowel obstruction currently with NG tube placement, currently on TPN, not stable for discharge.       Consultants:  Palliative care: Dr. Rowe Pavy 07/16/2021 PCCM: Dr.Meier 07/24/2021 General surgery: Dr. Barry Dienes 07/24/2021  Procedures:  Ultrasound-guided thoracentesis 07/27/2021 per Dr. Serafina Royals, IR Upper extremity Dopplers 07/20/2021 Lower extremity Dopplers 06/21/2021 2D echo 07/23/2021 Ultrasound-guided paracentesis: Dr. Laurence Ferrari IR 07/17/2021--3.4 L of yellow fluid removed Ultrasound-guided paracentesis 07/26/2021 2.9 L of hazy fluid removed per Dr.Mugweru, IR CT abdomen and pelvis 07/01/2021, 07/24/2021  Antimicrobials:  IV cefepime 07/24/2021>>>> 07/27/2021 IV Flagyl 07/24/2021>>>>> 07/27/2021   Subjective: Patient sleeping, somewhat drowsy.  Daughter at bedside who states patient was alert and talking to her early on this morning.  IV team at bedside trying to get a ultrasound IV placed.  NG tube intact.  Rectal tube with brown loose stool.  Objective: Vitals:   07/28/21 0700 07/28/21 0800 07/28/21 0900 07/28/21 1000  BP: (!) 105/56 (!) 130/96 (!) 111/50 126/80  Pulse: (!) 125 (!) 124 (!) 125 (!) 131  Resp: (!) 23 (!) 26 (!) 24 (!) 23  Temp: 100  F (37.8 C) 100 F (37.8 C) 100.2 F (37.9 C) 100.2 F (37.9 C)  TempSrc:      SpO2: 97% 99% 100% 100%  Weight:      Height:        Intake/Output Summary (Last 24 hours) at 07/28/2021  1046 Last data filed at 07/28/2021 1012 Gross per 24 hour  Intake 2325.38 ml  Output 1161 ml  Net 1164.38 ml   Filed Weights   07/26/21 0500 07/27/21 0500 07/28/21 0500  Weight: 93.8 kg 94.1 kg 94.1 kg    Examination:  General exam: NG tube in place.  Drowsy. Respiratory system: Clear to auscultation anterior lung fields.Marland Kitchen Respiratory effort normal. Cardiovascular system: Tachycardia.  No murmurs rubs or gallops.  No JVD.  1-2+ bilateral lower extremity edema.  Gastrointestinal system: Abdomen is distended, mildly soft, nontender to palpation, hypoactive bowel sounds.  Central nervous system: Drowsy. No focal neurological deficits. Extremities: Symmetric 5 x 5 power. Skin: No rashes, lesions or ulcers Psychiatry: Judgement and insight unable to assess.. Mood & affect appropriate.     Data Reviewed: I have personally reviewed following labs and imaging studies  CBC: Recent Labs  Lab 07/23/21 0345 07/24/21 0459 07/25/21 0832 07/26/21 0500 07/27/21 0431  WBC 3.2* 2.0* 2.1* 2.6* 4.6  NEUTROABS 2.1  --   --   --   --   HGB 10.7* 11.4* 9.6* 8.6* 8.8*  HCT 34.7* 36.9 31.0* 27.7* 28.1*  MCV 86.1 84.8 84.9 85.2 84.9  PLT 252 311 283 275 426    Basic Metabolic Panel: Recent Labs  Lab 07/24/21 0948 07/25/21 0832 07/26/21 0500 07/27/21 0431 07/28/21 0637  NA 130* 131* 136 137 139  K 4.1 3.7 3.3* 3.3* 3.8  CL 97* 100 100 107 109  CO2 22 19* 20* 20* 24  GLUCOSE 302* 260* 245* 242* 289*  BUN 44* 49* 38* 27* 23*  CREATININE 0.66 0.61 0.47 0.44 <0.30*  CALCIUM 8.0* 7.8* 8.3* 7.8* 7.7*  MG  --   --  2.2 2.2 2.1  PHOS  --   --   --  1.1* 1.7*    GFR: CrCl cannot be calculated (This lab value cannot be used to calculate CrCl because it is not a number: <0.30).  Liver Function Tests: Recent Labs  Lab 07/24/21 0948 07/25/21 0832 07/26/21 0500 07/27/21 0431 07/28/21 0637  AST 14* 15 13* 13* 11*  ALT 20 16 16 13 12   ALKPHOS 65 63 54 53 52  BILITOT 0.9 1.2 1.5* 1.4*  0.7  PROT 5.9* 5.7* 5.9* 5.4* 5.4*  ALBUMIN 2.0* 2.2* 2.9* 2.4* 2.2*    CBG: Recent Labs  Lab 07/27/21 1541 07/27/21 2024 07/27/21 2314 07/28/21 0304 07/28/21 0755  GLUCAP 169* 243* 230* 217* 257*     Recent Results (from the past 240 hour(s))  Urine Culture     Status: Abnormal   Collection Time: 07/20/21 12:29 PM   Specimen: Urine, Catheterized  Result Value Ref Range Status   Specimen Description   Final    URINE, CATHETERIZED Performed at [facility], [place] [address], [place], [place] [address]    Special Requests   Final    Immunocompromised Performed at [facility], [place] [address] [place], [place] [address]    Culture (A)  Final    <10,000 COLONIES/mL INSIGNIFICANT GROWTH Performed at [facility] [address] [place], [place] [address]    Report Status 07/21/2021 FINAL  Final  MRSA Next Gen  by PCR, Nasal     Status: Abnormal   Collection Time: 07/24/21  5:58 PM   Specimen: Nasal Mucosa; Nasal Swab  Result Value Ref Range Status   MRSA by PCR Next Gen DETECTED (A) NOT DETECTED Final    Comment: RESULT CALLED TO, READ BACK BY AND VERIFIED WITH: CALDERON,E RN @2141  ON 07/24/21 JACKSON,K (NOTE) The GeneXpert MRSA Assay (FDA approved for NASAL specimens only), is one component of a comprehensive MRSA colonization surveillance program. It is not intended to diagnose MRSA infection nor to guide or monitor treatment for MRSA infections. Test performance is not FDA approved in patients less than 5 years old. Performed at [facility], [place] [address], [place], [place] [address]   Culture, blood (routine x 2)     Status: None (Preliminary result)   Collection Time: 07/24/21  7:57 PM   Specimen: BLOOD  Result Value Ref Range Status   Specimen Description   Final    BLOOD BLOOD LEFT HAND Performed at [facility] [address] [place], [place] [address]    Special Requests    Final    BOTTLES DRAWN AEROBIC ONLY Blood Culture adequate volume Performed at [facility] [address], [place], [place] [address]    Culture   Final    NO GROWTH 4 DAYS Performed at [facility], [place] [address] [place], [place] [address]    Report Status PENDING  Incomplete  Culture, blood (routine x 2)     Status: None (Preliminary result)   Collection Time: 07/24/21  7:57 PM   Specimen: BLOOD  Result Value Ref Range Status   Specimen Description   Final    BLOOD LEFT WRIST Performed at [facility] [address] [place], [place] [address]    Special Requests   Final    BOTTLES DRAWN AEROBIC ONLY Blood Culture adequate volume Performed at [facility] [address] [place], [place] [address]    Culture   Final    NO GROWTH 4 DAYS Performed at [facility], [place] [address] [place], [place] [address]    Report Status PENDING  Incomplete  Urine Culture     Status: Abnormal   Collection Time: 07/25/21  1:36 AM   Specimen: In/Out Cath Urine  Result Value Ref Range Status   Specimen Description   Final    IN/OUT CATH URINE Performed at [facility] [address], [place], [place] [address]    Special Requests   Final    NONE Performed at [facility], [place] [address], [place], [place] [address]    Culture MULTIPLE SPECIES PRESENT, SUGGEST RECOLLECTION (A)  Final   Report Status 07/26/2021 FINAL  Final  Culture, body fluid w Gram Stain-bottle     Status: None (Preliminary result)   Collection Time: 07/26/21  2:29 PM   Specimen: Fluid  Result Value Ref Range Status   Specimen Description FLUID PERITONEAL  Final   Special Requests BOTTLES DRAWN AEROBIC AND ANAEROBIC  Final   Culture   Final    NO GROWTH 2 DAYS Performed at [facility], [place] [address], [place], [place] [address]    Report Status PENDING  Incomplete  Gram stain     Status: None  (Preliminary result)   Collection Time: 07/26/21  2:29 PM   Specimen: Fluid  Result Value Ref Range Status   Specimen Description FLUID PERITONEAL  Final   Special Requests NONE  Final   Gram Stain   Final    NO SQUAMOUS EPITHELIAL CELLS SEEN MODERATE WBC SEEN NO ORGANISMS SEEN Performed at [facility], [address], [place], [place] [address]    Report Status PENDING  Incomplete  Culture, body fluid w Gram Stain-bottle     Status: None (Preliminary result)   Collection Time: 07/27/21  1:15 PM   Specimen: Fluid  Result Value Ref Range Status   Specimen Description FLUID PLEURAL  Final   Special Requests   Final    BOTTLES DRAWN AEROBIC AND ANAEROBIC Blood Culture adequate volume   Culture   Final    NO GROWTH < 12 HOURS Performed at [facility], [place] [address], [place], [place] [address]    Report Status PENDING  Incomplete  Gram stain     Status: None (Preliminary result)   Collection Time: 07/27/21  1:15 PM   Specimen: Fluid  Result Value Ref Range Status   Specimen Description FLUID PLEURAL  Final   Special Requests   Final    BOTTLES DRAWN AEROBIC AND ANAEROBIC Blood Culture adequate volume   Gram Stain   Final    NO SQUAMOUS EPITHELIAL CELLS SEEN NO WBC SEEN NO ORGANISMS SEEN Performed at [facility], [address] [place], [place] [address]    Report Status PENDING  Incomplete         Radiology Studies: DG Abd 1 View  Result Date: 07/27/2021 CLINICAL DATA:  NG tube placement EXAM: ABDOMEN - 1 VIEW COMPARISON:  None. FINDINGS: Nasogastric tube with the tip projecting over the stomach. Gaseous distension the small bowel and colon. No evidence of pneumoperitoneum, portal venous gas or pneumatosis. No pathologic calcifications along the expected course of the ureters. No acute osseous abnormality. IMPRESSION: Nasogastric tube with the tip projecting over the stomach. Electronically Signed   By: Kathreen Devoid M.D.   On: 07/27/2021 11:22   DG  Abd 1 View  Result Date: 07/27/2021 CLINICAL DATA:  Abdominal distention, vomiting EXAM: ABDOMEN - 1 VIEW COMPARISON:  07/26/2021 FINDINGS: There are dilated bowel loops in the mid abdomen measuring up to 11.5 cm in maximum diameter. This finding is not changed significantly. There is haziness in the abdomen and pelvis, possibly suggesting ascites. IMPRESSION: There is dilation of bowel loops in mid abdomen suggesting ileus or partial obstruction. No significant interval changes are noted. Electronically Signed   By: Elmer Picker M.D.   On: 07/27/2021 08:06   US Paracentesis  Result Date: 07/26/2021 INDICATION: Patient with history of pancreatic cancer, recurrent ascites. Request to IR for diagnostic and therapeutic paracentesis. EXAM: ULTRASOUND GUIDED DIAGNOSTIC AND THERAPEUTIC PARACENTESIS MEDICATIONS: 10 mL 1% lidocaine COMPLICATIONS: None immediate. PROCEDURE: Informed written consent was obtained from the patient after a discussion of the risks, benefits and alternatives to treatment. A timeout was performed prior to the initiation of the procedure. Initial ultrasound scanning demonstrates a moderate amount of ascites within the left lower abdominal quadrant. The left lower abdomen was prepped and draped in the usual sterile fashion. 1% lidocaine was used for local anesthesia. Following this, a 19 gauge, 7-cm, Yueh catheter was introduced. An ultrasound image was saved for documentation purposes. The paracentesis was performed. The catheter was removed and a dressing was applied. The patient tolerated the procedure well without immediate post procedural complication. FINDINGS: A total of approximately 2.9 L of hazy yellow fluid was removed. Samples were sent to the  laboratory as requested by the clinical team. IMPRESSION: Successful ultrasound-guided paracentesis yielding 2.9 liters of peritoneal fluid. Read by Candiss Norse, PA-C Electronically Signed   By: Michaelle Birks M.D.   On: 07/26/2021  18:32   DG CHEST PORT 1 VIEW  Result Date: 07/27/2021 CLINICAL DATA:  Pleural effusion. Additional history provided: Pleural effusions status post thoracentesis. EXAM: PORTABLE CHEST 1 VIEW COMPARISON:  Prior chest radiographs 07/24/2021 and earlier. FINDINGS: Right chest infusion port catheter with tip projecting at the level of the superior cavoatrial junction. An enteric tube is present, extending below the level of the left hemidiaphragm with tip excluded from the field of view. The cardiomediastinal silhouette is unchanged. No significant right pleural effusion is appreciated status post thoracentesis. A small left pleural effusion is unchanged. Mild bibasilar atelectasis. No evidence of pneumothorax. No acute bony abnormality identified. IMPRESSION: No significant residual right pleural effusion is appreciated status post thoracentesis. No evidence of pneumothorax. Persistent small left pleural effusion. Mild bibasilar atelectasis. Electronically Signed   By: Kellie Simmering D.O.   On: 07/27/2021 13:54   US THORACENTESIS ASP PLEURAL SPACE W/IMG GUIDE  Result Date: 07/27/2021 INDICATION: History of pancreatic cancer, shortness of breast, pleural effusion seen on previous chest x-ray. Request for therapeutic and diagnostic thoracentesis. EXAM: ULTRASOUND GUIDED RIGHT THORACENTESIS MEDICATIONS: 10 mL 1% lidocaine COMPLICATIONS: None immediate. PROCEDURE: An ultrasound guided thoracentesis was thoroughly discussed with the patient and questions answered. The benefits, risks, alternatives and complications were also discussed. The patient understands and wishes to proceed with the procedure. Written consent was obtained. Ultrasound was performed to localize and mark an adequate pocket of fluid in the right chest. The area was then prepped and draped in the normal sterile fashion. 1% Lidocaine was used for local anesthesia. Under ultrasound guidance a 6 Fr Safe-T-Centesis catheter was introduced. Thoracentesis  was performed. The catheter was removed and a dressing applied. FINDINGS: A total of approximately 600 cc of hazy amber fluid was removed. Samples were sent to the laboratory as requested by the clinical team. Post procedure chest X-ray reviewed, negative for pneumothorax. IMPRESSION: Successful ultrasound guided right thoracentesis yielding 600 cc of pleural fluid. Read by: Durenda Guthrie, PA-C Electronically Signed   By: Ruthann Cancer M.D.   On: 07/27/2021 14:52        Scheduled Meds:  chlorhexidine  15 mL Mouth Rinse BID   Chlorhexidine Gluconate Cloth  6 each Topical Daily   enoxaparin (LOVENOX) injection  150 mg Subcutaneous Daily   fentaNYL  1 patch Transdermal Q72H   insulin aspart  0-20 Units Subcutaneous Q4H   insulin aspart  3 Units Subcutaneous Q4H   mouth rinse  15 mL Mouth Rinse q12n4p   mupirocin ointment  1 application Nasal BID   pantoprazole (PROTONIX) IV  40 mg Intravenous Q12H   scopolamine  1 patch Transdermal Q72H   sodium chloride flush  3 mL Intravenous Q12H   Continuous Infusions:  sodium chloride Stopped (07/27/21 1715)   potassium PHOSPHATE IVPB (in mmol)     promethazine (PHENERGAN) injection (IM or IVPB) 12.5 mg (07/24/21 1511)   TPN ADULT (ION) 40 mL/hr at 07/28/21 1012   TPN ADULT (ION)       LOS: 13 days    Time spent: 40 minutes    Irine Seal, MD [facility]   To contact the attending provider between 7A-7P or the covering provider during after hours 7P-7A, please log into the web site [email or web address] and access using universal [place] password  for that web site. If you do not have the password, please call the hospital operator.  07/28/2021, 10:46 AM

## 2021-07-28 NOTE — Progress Notes (Signed)
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  She has undergone extensive treatment since May 2021.  Her cancer seems to have progressed since then    PT Comments    Pt seen in ICU room 1226.  3 family members present.  Pt on RA avg 95% , resting HR 133 and BP 155/94.  General Comments: pt was 95% lethargic, non verbal and <5% eye contact.  Daughter also attempting to arouse pt but unsuccessful.  Assisted pt to EOB to attempt increased arousal was also unsuccessful. General bed mobility comments: pt was 95% lethargic and required + 2 Total Assist to transfer from supine to EOB.  Attempted EOB activity to increase alertness however was unsuccessful.  Pt required Max Assist to maintain upright posture.  Pt was able to support her head partially upright but still present with poor eye tracking.  HR increased to 145.  RA increased to 100% and RR avg 34.  Pt sat EOB x 6 min with full Therapist support.  Returned back to supine and posiitoned with multiple pillows.  Pt remained non verbal and lethargis throughout session.  Daughter and Son in law stayed in room during Therapy.  Very supportive.     Recommendations for follow up therapy are one component of a multi-disciplinary discharge planning process, led by the attending physician.  Recommendations may be updated based on patient status, additional functional criteria and insurance authorization.  Follow Up Recommendations  Skilled nursing-short term rehab (<3 hours/day)     Assistance Recommended at Discharge Frequent or constant Supervision/Assistance  Equipment Recommendations  Rolling walker (2 wheels);Wheelchair  (measurements PT)    Recommendations for Other Services       Precautions / Restrictions Precautions Precautions: Fall Precaution Comments: NG Tube, Rectal Tube     Mobility  Bed Mobility Overal bed mobility: Needs Assistance Bed Mobility: Supine to Sit;Sit to Supine     Supine to sit: +2 for physical assistance;+2 for safety/equipment;Total assist Sit to supine: Total assist;+2 for physical assistance;+2 for safety/equipment   General bed mobility comments: pt was 95% lethargic and required + 2 Total Assist to transfer from supine to EOB.  Attempted EOB activity to increase alertness however was unsuccessful.  Pt required Max Assist to maintain upright posture.  Pt was able to support her head partially upright but still present with poor eye tracking.  HR increased to 145.  RA increased to 100% and RR avg 34.  Pt sat EOB x 6 min with full Therapist support.  Returned back to supine and posiitoned with multiple pillows.  Pt remained non verbal and lethargis throughout session.  Daughter and Son in law stayed in room during Therapy.  Very supportive.    Transfers                   General transfer comment: unable to attempt any OOB activity due to level of cognition.    Ambulation/Gait                   Stairs             Wheelchair Mobility    Modified Rankin (Stroke Patients Only)       Balance  Cognition Arousal/Alertness: Lethargic Behavior During Therapy: Flat affect Overall Cognitive Status: Impaired/Different from baseline                                 General Comments: pt was 95% lethargic, non verbal and <5% eye contact        Exercises      General Comments        Pertinent Vitals/Pain Pain Assessment: No/denies pain    Home Living                          Prior Function            PT Goals (current goals can now be found in the  care plan section) Progress towards PT goals: Progressing toward goals    Frequency    Min 2X/week      PT Plan Current plan remains appropriate    Co-evaluation              AM-PAC PT "6 Clicks" Mobility   Outcome Measure  Help needed turning from your back to your side while in a flat bed without using bedrails?: Total Help needed moving from lying on your back to sitting on the side of a flat bed without using bedrails?: Total Help needed moving to and from a bed to a chair (including a wheelchair)?: Total Help needed standing up from a chair using your arms (e.g., wheelchair or bedside chair)?: Total Help needed to walk in hospital room?: Total Help needed climbing 3-5 steps with a railing? : Total 6 Click Score: 6    End of Session Equipment Utilized During Treatment: Gait belt Activity Tolerance: Patient limited by lethargy Patient left: in bed;with call bell/phone within reach;with bed alarm set;with family/visitor present Nurse Communication: Mobility status PT Visit Diagnosis: Other abnormalities of gait and mobility (R26.89);Difficulty in walking, not elsewhere classified (R26.2)     Time: 8676-1950 PT Time Calculation (min) (ACUTE ONLY): 28 min  Charges:  $Therapeutic Activity: 23-37 mins                    Rica Koyanagi  PTA Acute  Rehabilitation Services Pager      [phone] Office      [phone]

## 2021-07-28 NOTE — Progress Notes (Signed)
Everything really is about the same.  She now is on some TPN.  I am not sure this is really going to help.  However, her daughter is appreciative of the efforts being made trying to help her mother.  There is no lab work back yet today.  She really is about the same overall.  I do not hear any bowel sounds were listened to her abdomen.  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  There is no obvious pain from what I can tell.  She is having no dyspnea.  Apparently has maybe some confusion.  She has some hand protectors on so she does not pull out the NG tube.  She did have a thoracentesis done yesterday.  I think 600 cc of fluid was removed.  Again this is all from her malignancy.  Her vital signs show temperature 100.  Pulse 125.  Blood pressure 105/56.  Her lungs sound pretty clear.  Again there may be some decrease over on the right side.  Cardiac exam tachycardic but regular.  Abdomen is distended.  I cannot hear any bowel sounds.  There is no obvious guarding.  Extremities shows some mild edema in her legs.  Neurological exam shows overall decreased alertness.  I realize how difficult this is.  However, the religious believes of the patient are being honored.  I just hate the fact that she is just existing.  I really do not see that she is going to make much, if any improvement.  I just think her body is just weak and from this malignancy and that she is not able to maintain herself.  I know that all the staff down the ICU are doing a great job with her.  I do appreciate all their compassion.  Lattie Haw, MD  2 Timothy 2:3-4

## 2021-07-28 NOTE — Progress Notes (Signed)
Daily Progress Note   Patient Name: Angela Wilson       Date: 07/28/2021 DOB: 21-May-1966  Age: 55 y.o. MRN#: [id number] Attending Physician: Eugenie Filler, MD Primary Care Physician: Just, Laurita Quint, FNP (Inactive) Admit Date: 07/19/2021  Reason for Consultation/Follow-up: Establishing goals of care  Subjective: Opens eyes when her name is called, daughter and son at bedside. No distress other than patient has been complaining of discomfort in her throat due to the NG tube, discussed about using throat spray.   Length of Stay: 13  Current Medications: Scheduled Meds:   chlorhexidine  15 mL Mouth Rinse BID   Chlorhexidine Gluconate Cloth  6 each Topical Daily   enoxaparin (LOVENOX) injection  150 mg Subcutaneous Daily   fentaNYL  1 patch Transdermal Q72H   insulin aspart  0-20 Units Subcutaneous Q4H   insulin aspart  3 Units Subcutaneous Q4H   mouth rinse  15 mL Mouth Rinse q12n4p   mupirocin ointment  1 application Nasal BID   pantoprazole (PROTONIX) IV  40 mg Intravenous Q12H   scopolamine  1 patch Transdermal Q72H   sodium chloride flush  3 mL Intravenous Q12H    Continuous Infusions:  sodium chloride Stopped (07/27/21 1715)   potassium PHOSPHATE IVPB (in mmol) 85 mL/hr at 07/28/21 1635   promethazine (PHENERGAN) injection (IM or IVPB) 12.5 mg (07/24/21 1511)   TPN ADULT (ION) 40 mL/hr at 07/28/21 1635   TPN ADULT (ION)      PRN Meds: sodium chloride, acetaminophen **OR** acetaminophen, HYDROmorphone (DILAUDID) injection, lip balm, LORazepam, metoprolol tartrate, ondansetron **OR** ondansetron (ZOFRAN) IV, phenol, prochlorperazine, promethazine (PHENERGAN) injection (IM or IVPB), sodium chloride flush  Physical Exam         Appears chronically ill fatigued Regular  work of breathing Has O2 [place] Abdomen distended Has edema  has NG tube Vital Signs: BP 126/71 (BP Location: Left Arm)   Pulse (!) 132   Temp 98.8 F (37.1 C) (Axillary)   Resp (!) 29   Ht 5\' 6"  (1.676 m)   Wt 94.1 kg   SpO2 100%   BMI 33.48 kg/m  SpO2: SpO2: 100 % O2 Device: O2 Device: Room Air O2 Flow Rate: O2 Flow Rate (L/min): 0 L/min  Intake/output summary:  Intake/Output Summary (Last 24 hours) at 07/28/2021 1645 Last data filed  at 07/28/2021 1635 Gross per 24 hour  Intake 2477.56 ml  Output 1486 ml  Net 991.56 ml    LBM: Last BM Date: 07/28/21 Baseline Weight: Weight: 100.7 kg Most recent weight: Weight: 94.1 kg PPS 30%      Palliative Assessment/Data:      Patient Active Problem List   Diagnosis Date Noted   Malnutrition of moderate degree 07/28/2021   Ileus (HCC)    Hypokalemia    Hypophosphatemia    Hypomagnesemia    Intravascular volume depletion 07/18/2021   Internal jugular (IJ) vein thromboembolism, chronic, right ([place]) 07/16/2021   Goals of care, counseling/discussion 07/16/2021   Acute respiratory failure with hypoxia ([place]) 07/12/2021   Abdominal ascites 06/19/2021   Pleural effusion on right 07/11/2021   Hyponatremia 07/19/2021   Genetic testing 05/12/2020   Family history of breast cancer    Family history of ovarian cancer    Family history of kidney cancer    Family history of brain cancer    Essential hypertension, benign 04/06/2020   Pancreatic cancer ([place]) 02/14/2020   Cancer associated pain 02/13/2020   Stage IV adenocarcinoma of pancreas ([place]) 02/03/2020   Screening for colon cancer 01/07/2020   Encounter for screening mammogram for malignant neoplasm of breast 01/07/2020   Need for Tdap vaccination 01/07/2020   Pancreatic mass 12/30/2019   Nephrolithiasis 12/20/2012   GERD (gastroesophageal reflux disease) 12/20/2012   Type 2 diabetes mellitus ([place]) 11/09/2012    Palliative Care Assessment & Plan   Patient Profile:     Assessment:  55 yo lady with life limiting illness of pancreatic cancer, ascites, abdominal pain, LE edema.   Recommendations/Plan: Continue current care.  Remains full code/full scope  Continue efforts to assist with documentation outlining her medical illness to support her son and daughter's applications for visitation visa.   Goals of Care and Additional Recommendations: Limitations on Scope of Treatment: Full Scope Treatment  Code Status:    Code Status Orders  (From admission, onward)           Start     Ordered   07/03/2021 2022  Full code  Continuous        07/19/2021 2024           Code Status History     Date Active Date Inactive Code Status Order ID Comments User Context   02/14/2020 0213 02/18/2020 2347 Full Code [id number]  Clance Boll, MD Inpatient       Prognosis: Likely weeks at best, she continues to have decline overall and is at high risk for acute decompensation.  Discharge Planning: To Be Determined although I fear this will be a terminal admission.  Care plan was discussed with IDT.    Thank you for allowing the Palliative Medicine Team to assist in the care of this patient.   Total Time 15 Prolonged Time Billed  no   Greater than 50%  of this time was spent counseling and coordinating care related to the above assessment and plan.  Loistine Chance, MD  Please contact Palliative Medicine Team phone at [phone] for questions and concerns.

## 2021-07-28 NOTE — Progress Notes (Addendum)
PHARMACY - TOTAL PARENTERAL NUTRITION CONSULT NOTE   Indication: Prolonged ileus  Patient Measurements: Height: 5\' 6"  (167.6 cm) Weight: 94.1 kg (207 lb 7.3 oz) IBW/kg (Calculated) : 59.3 TPN AdjBW (KG): 69.6 Body mass index is 33.48 kg/m. Usual Weight:   Assessment: 66 yoF admitted on 10/27 with N/V, abdominal pain.  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  She has had poor PO intake, NPO since 11/5 d/t colonic ileus.  NG tube removed by pt on 11/7, replaced on 11/8.  She is high risk for re-feeding.  GOC:  Due to religious beliefs, patient and family continue to want full scope of care without consideration of scaling back treatment in efforts to focus on comfort.  Glucose / Insulin: Hx of diabetes, not on any medications PTA.  Most recent A1c 8.6% (01/2020) - No recent steroids; dexamethasone 10mg  IV given 07/13/21 - CBGs elevated 169-257  - mSSI:  26 units/ 24 hrs - Add scheduled Novolog coverage 11/9, then add insulin to next TPN starting at 18:00 Electrolytes: HypoK, HypoPhos, CO2 low.  Others WNL including CorrCa 9.08 Renal: SCr low/stable.  BUN decreased to 27 Hepatic: AST/ALT low/stable, Tbili 1.4 TG:  127 (11/9) Intake / Output; MIVF: IVF x1L yesterday, no ongoing orders.  UOP down to 415 ml; NG output 200 mL; Fecal management system in place. GI Imaging:  11/5 CT: extensive peritoneal metastatic disease, large cystic pelvic masses, pancreatic tail tumor, and some heterogeneity in the liver suspicious for hepatic metastatic lesions.  Central access: Port TPN start date: 11/8  Nutritional Goals: Goal TPN rate is 90 mL/hr (provides 121 g of protein and 2160 kcals per day)  RD Assessment:  (11/8) Estimated Needs Total Energy Estimated Needs: 2100-2300 kcal Total Protein Estimated Needs: 110-125 grams Total Fluid Estimated Needs: >/=  1.6 L/day  Current Nutrition:  NPO  Plan:  Now:  KPhos 30 mmol IV x1  At 18:00 Advance TPN 60 mL/hr at 1800 (slow titration d/t hyperglycemia and risk of refeeding) Electrolytes in TPN: Na 68mEq/L, K 66mEq/L, Ca 58mEq/L, Mg 16mEq/L, and Phos 1mmol/L. Cl:Ac 1:2 - monitor electrolyte needs closely with decreased UOP Standard MVI and trace elements in TPN Increase to resistant SSI q4h and adjust as needed  Add regular insulin to TPN:  10 units Thiamine 100mg  IV today, then add to TPN starting on 11/10.  IVF per MD (none) Monitor TPN labs on Mon/Thurs.   Gretta Arab PharmD, BCPS Clinical Pharmacist [facility] main pharmacy [phone] 07/28/2021 8:12 AM

## 2021-07-28 NOTE — Plan of Care (Signed)
  Problem: Clinical Measurements: Goal: Will remain free from infection Outcome: Progressing Goal: Respiratory complications will improve Outcome: Progressing   Problem: Elimination: Goal: Will not experience complications related to bowel motility Outcome: Progressing   Problem: Education: Goal: Knowledge of General Education information will improve Description: Including pain rating scale, medication(s)/side effects and non-pharmacologic comfort measures Outcome: Not Progressing   Problem: Health Behavior/Discharge Planning: Goal: Ability to manage health-related needs will improve Outcome: Not Progressing   Problem: Clinical Measurements: Goal: Ability to maintain clinical measurements within normal limits will improve Outcome: Not Progressing Goal: Diagnostic test results will improve Outcome: Not Progressing Goal: Cardiovascular complication will be avoided Outcome: Not Progressing   Problem: Activity: Goal: Risk for activity intolerance will decrease Outcome: Not Progressing   Problem: Nutrition: Goal: Adequate nutrition will be maintained Outcome: Not Progressing   Problem: Coping: Goal: Level of anxiety will decrease Outcome: Not Progressing   Problem: Elimination: Goal: Will not experience complications related to urinary retention Outcome: Not Progressing   Problem: Pain Managment: Goal: General experience of comfort will improve Outcome: Not Progressing   Problem: Safety: Goal: Ability to remain free from injury will improve Outcome: Not Progressing   Problem: Skin Integrity: Goal: Risk for impaired skin integrity will decrease Outcome: Not Progressing

## 2021-07-29 ENCOUNTER — Encounter: Payer: Self-pay | Admitting: *Deleted

## 2021-07-29 ENCOUNTER — Inpatient Hospital Stay (HOSPITAL_COMMUNITY): Payer: 59

## 2021-07-29 DIAGNOSIS — J9601 Acute respiratory failure with hypoxia: Secondary | ICD-10-CM | POA: Diagnosis not present

## 2021-07-29 DIAGNOSIS — G893 Neoplasm related pain (acute) (chronic): Secondary | ICD-10-CM

## 2021-07-29 LAB — GRAM STAIN
Gram Stain: NONE SEEN
Special Requests: ADEQUATE

## 2021-07-29 LAB — CULTURE, BLOOD (ROUTINE X 2)
Culture: NO GROWTH
Culture: NO GROWTH
Special Requests: ADEQUATE
Special Requests: ADEQUATE

## 2021-07-29 LAB — GLUCOSE, CAPILLARY
Glucose-Capillary: 222 mg/dL — ABNORMAL HIGH (ref 70–99)
Glucose-Capillary: 231 mg/dL — ABNORMAL HIGH (ref 70–99)
Glucose-Capillary: 328 mg/dL — ABNORMAL HIGH (ref 70–99)
Glucose-Capillary: 230 mg/dL — ABNORMAL HIGH (ref 70–99)
Glucose-Capillary: 264 mg/dL — ABNORMAL HIGH (ref 70–99)
Glucose-Capillary: 197 mg/dL — ABNORMAL HIGH (ref 70–99)
Glucose-Capillary: 201 mg/dL — ABNORMAL HIGH (ref 70–99)

## 2021-07-29 LAB — COMPREHENSIVE METABOLIC PANEL
ALT: 11 U/L (ref 0–44)
AST: 13 U/L — ABNORMAL LOW (ref 15–41)
Albumin: 2.1 g/dL — ABNORMAL LOW (ref 3.5–5.0)
Alkaline Phosphatase: 55 U/L (ref 38–126)
Anion gap: 6 (ref 5–15)
BUN: 28 mg/dL — ABNORMAL HIGH (ref 6–20)
CO2: 24 mmol/L (ref 22–32)
Calcium: 8 mg/dL — ABNORMAL LOW (ref 8.9–10.3)
Chloride: 109 mmol/L (ref 98–111)
Creatinine, Ser: 0.34 mg/dL — ABNORMAL LOW (ref 0.44–1.00)
GFR, Estimated: >60 mL/min (ref >60)
Glucose, Bld: 264 mg/dL — ABNORMAL HIGH (ref 70–99)
Potassium: 3.8 mmol/L (ref 3.5–5.1)
Sodium: 139 mmol/L (ref 135–145)
Total Bilirubin: 0.6 mg/dL (ref 0.3–1.2)
Total Protein: 5.7 g/dL — ABNORMAL LOW (ref 6.5–8.1)

## 2021-07-29 LAB — CBC
HCT: 30.9 % — ABNORMAL LOW (ref 36.0–46.0)
Hemoglobin: 9.6 g/dL — ABNORMAL LOW (ref 12.0–15.0)
MCH: 26.5 pg (ref 26.0–34.0)
MCHC: 31.1 g/dL (ref 30.0–36.0)
MCV: 85.4 fL (ref 80.0–100.0)
Platelets: 259 10*3/uL (ref 150–400)
RBC: 3.62 MIL/uL — ABNORMAL LOW (ref 3.87–5.11)
RDW: 20.1 % — ABNORMAL HIGH (ref 11.5–15.5)
WBC: 8.4 10*3/uL (ref 4.0–10.5)
nRBC: 3.1 % — ABNORMAL HIGH (ref 0.0–0.2)

## 2021-07-29 LAB — MAGNESIUM: Magnesium: 2 mg/dL (ref 1.7–2.4)

## 2021-07-29 LAB — PHOSPHORUS: Phosphorus: 2.2 mg/dL — ABNORMAL LOW (ref 2.5–4.6)

## 2021-07-29 MED ORDER — HYDROMORPHONE HCL 1 MG/ML IJ SOLN
0.5000 mg | Freq: Four times a day (QID) | INTRAMUSCULAR | Status: DC | PRN
  Administered 2021-08-01 – 2021-08-02 (×2): 0.5 mg via INTRAVENOUS
  Filled 2021-07-29 (×2): qty 0.5
  Filled 2021-07-29: qty 1

## 2021-07-29 MED ORDER — METOPROLOL TARTRATE 5 MG/5ML IV SOLN
5.0000 mg | Freq: Four times a day (QID) | INTRAVENOUS | Status: DC
  Administered 2021-07-29 – 2021-07-30 (×5): 5 mg via INTRAVENOUS
  Filled 2021-07-29 (×5): qty 5

## 2021-07-29 MED ORDER — TRAVASOL 10 % IV SOLN
INTRAVENOUS | Status: AC
  Filled 2021-07-29: qty 806.4

## 2021-07-29 MED ORDER — POTASSIUM PHOSPHATES 15 MMOLE/5ML IV SOLN
15.0000 mmol | Freq: Once | INTRAVENOUS | Status: AC
  Administered 2021-07-29: 15 mmol via INTRAVENOUS
  Filled 2021-07-29: qty 5

## 2021-07-29 MED ORDER — INSULIN GLARGINE-YFGN 100 UNIT/ML ~~LOC~~ SOLN
10.0000 [IU] | Freq: Once | SUBCUTANEOUS | Status: AC
  Administered 2021-07-29: 10 [IU] via SUBCUTANEOUS
  Filled 2021-07-29: qty 0.1

## 2021-07-29 NOTE — Progress Notes (Signed)
[name] is really about the same.  She still has a NG tube in.  It is putting out dark liquid.  She is on the TPN..  She is sleeping this morning.  Her abdomen is still distended.  I really cannot hear any bowel sounds when I listened to it this morning.  There is no obvious pain.  I think she is just getting the pain medications as needed.  Her labs show white cell count of 8.4.  Hemoglobin 9.6.  Platelet count 259,000.  Her albumin is 2.1.  Her blood sugars 264.  Her creatinine 0.34.  BUN is 28.  I would probably repeat another abdominal x-ray on her to see if this shows any improvement in the ileus/gaseous distention.  We will see any improvement, sometimes I think, octreotide has been used to help.  I am glad that her hemoglobin is trending upward a little bit.  Overall, I really just do not see how we are going to make significant improvement in her overall status.  I know she is trying hard.  I know her daughter is trying with her.  Her whole family is wanting to be with her during this time.  We are working on trying to get some family members over from [place].  At least, she is not uncomfortable.  I know the NG tube does bother her, according to her daughter.  I think that we do see an improvement in the abdominal gas, and then we can hopefully see about getting the NG tube out.  I know the staff in the ICU are doing a fantastic job with her.  I would very much appreciate all their kindness and there compassion.  Lattie Haw, MD  Psalm 34:8

## 2021-07-29 NOTE — Progress Notes (Signed)
PHARMACY - TOTAL PARENTERAL NUTRITION CONSULT NOTE   Indication: Prolonged ileus  Patient Measurements: Height: 5\' 6"  (167.6 cm) Weight: 94.1 kg (207 lb 7.3 oz) IBW/kg (Calculated) : 59.3 TPN AdjBW (KG): 69.6 Body mass index is 33.48 kg/m. Usual Weight:   Assessment: 41 yoF admitted on 10/27 with N/V, abdominal pain.  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  She has had poor PO intake, NPO since 11/5 d/t colonic ileus.  NG tube removed by pt on 11/7, replaced on 11/8.  She is high risk for re-feeding.  GOC:  Due to religious beliefs, patient and family continue to want full scope of care without consideration of scaling back treatment in efforts to focus on comfort.  Glucose / Insulin: Hx of diabetes, not on any medications PTA.  Most recent A1c 8.6% (01/2020) - No recent steroids; dexamethasone 10mg  IV given 07/13/21 - CBGs elevated 157 - 328  - mSSI:  46 units/ 24 hrs  Electrolytes: K 3.8, Phos 2.2.  Others WNL including CorrCa 9.52 Renal: SCr low/stable.  BUN decreased to 28 Hepatic: AST/ALT low/stable, Tbili WNL TG:  127 (11/9) Intake / Output; MIVF: UOP down to 317 ml; NG output 350 mL;100 ml stool OP;  Fecal management system in place. GI Imaging:  11/5 CT: extensive peritoneal metastatic disease, large cystic pelvic masses, pancreatic tail tumor, and some heterogeneity in the liver suspicious for hepatic metastatic lesions.  Central access: Port TPN start date: 11/8  Nutritional Goals: Goal TPN rate is 90 mL/hr (provides 121 g of protein and 2160 kcals per day)  RD Assessment:  (11/8) Estimated Needs Total Energy Estimated Needs: 2100-2300 kcal Total Protein Estimated Needs: 110-125 grams Total Fluid Estimated Needs: >/= 1.6 L/day  Current Nutrition:  NPO  Plan:  Now:  Kphos 15 mMol (22 meq K)  At 18:00 Continue TPN @ 60 mL/hr  at 1800 will not advance today due to hyperglycemia  Electrolytes in TPN: Na 56mEq/L, K 34mEq/L, Ca 80mEq/L, Mg 36mEq/L, and Phos 33mmol/L. Cl:Ac 1:2 - monitor electrolyte needs closely with decreased UOP Standard MVI and trace elements in TPN continue resistant SSI q4h and adjust as needed  Increase regular insulin in  TPN:  30 units Thiamine in TPN  IVF per MD (none) Monitor TPN labs on Mon/Thurs. BMET, Mg & Phos in AM  Eudelia Bunch, Pharm.D 07/29/2021 8:48 AM

## 2021-07-29 NOTE — Consult Note (Addendum)
Referring Provider: Dr. Irine Seal Primary Care Physician:  Just, Laurita Quint, FNP (Inactive) Primary Gastroenterologist:  Dr. Leonia Corona at [facility].   Reason for Consultation:  colonic ileus and emesis.   HPI: Angela Wilson is a 55 y.o. 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 While here patient remained remains tachycardic, has severe protein calorie malnutrition.  Patient initially presented with nausea vomiting which improved, however patient was not have any bowel movement so she was given MiraLAX, Dulcolax and lactulose, only 1 days worth, proceeded to have diarrhea with large bowel movements, rectal tube placed and laxative stopped. Retractable nausea and vomiting started with placement of NG tube and stepdown unit. CT abdomen and pelvis without contrast due to severe allergy showed possible ischemic bowel versus colonic ileus. Patient was seen by Dr. 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 GI consulted due to colonic ileus and emesis.   07/26/2021 paracentesis Patient started on TPN. NG tube placed 07/27/2021.  Patient sleeping, some response to stimuli but primary history comes from daughter. Daughter states she had has received more pain medication and for the past day has been less interactive.  Patient states pain medications are causing her to be less interactive and she is wishing they were decreased. Daughter states since NG tube placed no nausea or vomiting. 200 cc of bilious dark fluid in NG tube container. per I&O's 800 mL total. Patient has rectal  tube in place, per I&O's 1300 mL of stool output. Prior to coming to the ICU and in the hospital patient states she was moving outpatient and walking, has not had food for 2 to 3 days per daughter so she believes this is causing her weakness and a lot of her symptoms.  ABD XRAY 07/29/2021 1. No significant interval change and multiple distended bowel loops concerning for ileus/obstruction. 2. NG tube with side port and distal tip overlying the gastric body.  CT AB Pelvis 07/24/2021 1. Trace gas in tiny venous structures to the left of the stomach and also along the anterior omentum. Although not a typical distribution for portal venous gas, there is also dilated colon up to the level of the sigmoid colon (without visible pneumatosis). Correlate with lactate levels in assessing for the possibility of ischemic bowel. 2. Generally similar appearance of large right effusion, substantial ascites, extensive peritoneal metastatic disease, large cystic pelvic masses, pancreatic tail tumor, and some heterogeneity in the liver suspicious for hepatic metastatic lesions. 3. Bilateral punctate nonobstructive nephrolithiasis. 4. Atelectasis in the left lower lobe, right middle lobe, and right lower lobe. 5. Stable small pericardial effusion.   07/27/2021 CXR s/p thoracentesis No significant residual right pleural effusion is appreciated status post thoracentesis. No evidence of pneumothorax. Persistent small left pleural effusion. Mild bibasilar atelectasis.    Past Medical History:  Diagnosis Date   Diabetes mellitus without complication ([place])    Family history of brain cancer    Family history of breast cancer    Family history of kidney cancer    Family history of ovarian cancer    GERD (gastroesophageal reflux disease)    Hyperlipidemia    Hypertension    Kidney stone  Pancreatic cancer ([place]) 02/03/2020   Shingles    Type 2 diabetes mellitus ([place])     Past Surgical History:   Procedure Laterality Date   CESAREAN SECTION     x 4   IR IMAGING GUIDED PORT INSERTION  02/18/2020   KIDNEY STONE SURGERY      Prior to Admission medications   Medication Sig Start Date End Date Taking? Authorizing Provider  albuterol (PROVENTIL HFA;VENTOLIN HFA) 108 (90 Base) MCG/ACT inhaler Inhale 2 puffs into the lungs every 4 (four) hours as needed for wheezing or shortness of breath. 04/01/16  Yes Mabe, Shanon Brow, NP  dicyclomine (BENTYL) 10 MG capsule Take 10 mg by mouth daily as needed for spasms. 06/03/21  Yes [provider]  diphenoxylate-atropine (LOMOTIL) 2.5-0.025 MG tablet Take 2 tablets by mouth 4 (four) times daily as needed for diarrhea or loose stools. 06/23/21  Yes Volanda Napoleon, MD  fentaNYL (DURAGESIC) 75 MCG/HR Place 2 patches onto the skin every other day. Patient taking differently: Place 1-2 patches onto the skin every other day. 07/05/21  Yes Ennever, Rudell Cobb, MD  gabapentin (NEURONTIN) 300 MG capsule TAKE 2 CAPSULES BY MOUTH IN THE DAYTIME AND 2 CAPSULES AT BEDTIME Patient taking differently: Take 600 mg by mouth 3 (three) times daily. 12/08/20 12/08/21 Yes Ennever, Rudell Cobb, MD  HYDROcodone bit-homatropine (HYCODAN) 5-1.5 MG/5ML syrup Take 5 mLs by mouth every 6 (six) hours as needed for cough. 03/04/21  Yes Ennever, Rudell Cobb, MD  HYDROmorphone (DILAUDID) 4 MG tablet Take 1 tablet (4 mg total) by mouth every 4 (four) hours as needed for severe pain. 06/23/21  Yes Volanda Napoleon, MD  Lactulose 20 GM/30ML SOLN Take 30 mLs (20 g total) by mouth 3 (three) times daily as needed. Patient taking differently: Take 30 mLs by mouth 3 (three) times daily as needed (for constipation). 05/07/21  Yes Volanda Napoleon, MD  lidocaine-prilocaine (EMLA) cream APPLY TO PORT 1 HOUR BEFORE ACCESS. Patient taking differently: Apply 1 application topically See admin instructions. Apply to port 1 hour before access 02/17/21 02/17/22 Yes Ennever, Rudell Cobb, MD  LORazepam (ATIVAN) 0.5 MG tablet  Take 0.5 mg every six hours as needed for nausea. 06/25/21  Yes Ennever, Rudell Cobb, MD  OLANZapine (ZYPREXA) 10 MG tablet Take 1 tablet (10 mg total) by mouth at bedtime. Take every day for nausea/vomiting 06/07/21  Yes Ennever, Rudell Cobb, MD  omeprazole (PRILOSEC) 40 MG capsule Take 40 mg by mouth daily as needed (acid reflux). 04/22/21  Yes [provider]  ondansetron (ZOFRAN) 4 MG tablet Take 1 tablet (4 mg total) by mouth every 6 (six) hours as needed for nausea. 07/13/21  Yes Celso Amy, NP  rivaroxaban (XARELTO) 20 MG TABS tablet Take 1 tablet (20 mg total) by mouth daily with supper. 05/12/21  Yes Volanda Napoleon, MD  amLODipine (NORVASC) 5 MG tablet Take 5 mg by mouth daily. Patient not taking: No sig reported 06/17/21   [provider]  cetirizine (ZYRTEC) 10 MG tablet Take 1 tablet (10 mg total) by mouth daily. Patient not taking: Reported on 06/29/2021 04/06/20   Jacelyn Pi, Lilia Argue, MD  dexamethasone (DECADRON) 4 MG tablet Take 1 tablet (4 mg total) by mouth 2 (two) times daily with a meal. For three (3) days following chemotherapy. 03/26/21   Volanda Napoleon, MD  dronabinol (MARINOL) 2.5 MG capsule Take 1 capsule (2.5 mg total) by mouth 2 (two) times daily before lunch and supper. 06/23/21  Volanda Napoleon, MD  ergocalciferol (VITAMIN D2) 1.25 MG (50000 UT) capsule Take 1 capsule (50,000 Units total) by mouth once a week. Patient not taking: Reported on 07/04/2021 10/07/20   Just, Laurita Quint, FNP  lisinopril (ZESTRIL) 10 MG tablet Take 10 mg by mouth daily. Patient not taking: Reported on 07/04/2021 06/17/21   [provider]  morphine (ROXANOL) 20 MG/ML concentrated solution Take 1 mL (20 mg total) by mouth every 3 (three) hours as needed for severe pain. 07/09/21   Volanda Napoleon, MD  polyethylene glycol (MIRALAX / GLYCOLAX) 17 g packet Take 17 g by mouth daily as needed. Patient not taking: No sig reported 02/18/20   Alma Friendly, MD  prochlorperazine  (COMPAZINE) 10 MG tablet Take 1 tablet (10 mg total) by mouth every 6 (six) hours as needed for nausea or vomiting. 05/27/21   Volanda Napoleon, MD  prochlorperazine (COMPAZINE) 25 MG suppository Unwrap and place 1 suppository (25 mg total) rectally every 8 (eight) hours as needed for nausea or vomiting. 07/05/21   Volanda Napoleon, MD  morphine 20 MG SUPP Place 1 suppository (20 mg total) rectally every 4 (four) hours as needed. 07/05/21   Volanda Napoleon, MD    Scheduled Meds:  chlorhexidine  15 mL Mouth Rinse BID   Chlorhexidine Gluconate Cloth  6 each Topical Daily   enoxaparin (LOVENOX) injection  150 mg Subcutaneous Daily   fentaNYL  1 patch Transdermal Q72H   insulin aspart  0-20 Units Subcutaneous Q4H   insulin glargine-yfgn  10 Units Subcutaneous Once   mouth rinse  15 mL Mouth Rinse q12n4p   metoprolol tartrate  5 mg Intravenous Q6H   pantoprazole (PROTONIX) IV  40 mg Intravenous Q12H   scopolamine  1 patch Transdermal Q72H   sodium chloride flush  3 mL Intravenous Q12H   Continuous Infusions:  sodium chloride Stopped (07/27/21 1715)   potassium PHOSPHATE IVPB (in mmol) 15 mmol (07/29/21 1000)   promethazine (PHENERGAN) injection (IM or IVPB) 12.5 mg (07/24/21 1511)   TPN ADULT (ION) 60 mL/hr at 07/29/21 0900   TPN ADULT (ION)     PRN Meds:.sodium chloride, acetaminophen **OR** acetaminophen, HYDROmorphone (DILAUDID) injection, lip balm, LORazepam, metoprolol tartrate, ondansetron **OR** ondansetron (ZOFRAN) IV, phenol, prochlorperazine, promethazine (PHENERGAN) injection (IM or IVPB), sodium chloride flush  Allergies as of 07/16/2021 - Review Complete 07/14/2021  Allergen Reaction Noted   Ivp dye [iodinated diagnostic agents] Itching and Rash 02/13/2020   Morphine and related Hives 05/05/2015   Penicillins Rash 08/04/2012    Family History  Problem Relation Age of Onset   Heart attack Maternal Uncle    Breast cancer Paternal Aunt        dx. in her 15s   Brain cancer  Paternal Uncle 64   Kidney cancer Cousin        dx. <50   Ovarian cancer Cousin    Breast cancer Cousin        dx. "young"   Ovarian cancer Cousin    Cancer Cousin        Cancer on the back, dx. in her 43s   Lung cancer Cousin     Social History   Socioeconomic History   Marital status: Divorced    Spouse name: Not on file   Number of children: Not on file   Years of education: Not on file   Highest education level: Not on file  Occupational History   Not on file  Tobacco Use  Smoking status: Never   Smokeless tobacco: Never  Vaping Use   Vaping Use: Never used  Substance and Sexual Activity   Alcohol use: No   Drug use: Never   Sexual activity: Not on file  Other Topics Concern   Not on file  Social History Narrative   ** Merged History Encounter **       Social Determinants of Health   Financial Resource Strain: Not on file  Food Insecurity: Not on file  Transportation Needs: Not on file  Physical Activity: Not on file  Stress: Not on file  Social Connections: Not on file  Intimate Partner Violence: Not on file    Review of Systems:  Review of Systems  Unable to perform ROS: Acuity of condition  ROS from note review and daughter  Physical Exam: Vital signs: Vitals:   07/29/21 0600 07/29/21 0800  BP:  (!) 121/104  Pulse: (!) 140 (!) 127  Resp: (!) 25 20  Temp: 98.6 F (37 C) (!) 100.7 F (38.2 C)  SpO2: 97% 97%   Last BM Date: 07/28/21 General:   Chronically ill appearing female Heart:  Tachycardia, regular rhythm; no murmurs Pulm: Right anterior wheezing, and bilateral lower rhonchi.  Abdomen:  Distended, Obese AB, skin exam normal, Absent bowel sounds. mild tenderness in the lower abdomen. Without guarding and Without rebound Extremities:  With non pitting edema bilateral Neurologic:  Will only open eyes briefly to stimuli;no clonus.  Psych:  Sedated, very limited response to stimuli   GI:  Lab Results: Recent Labs    07/27/21 0431  07/28/21 0637 07/29/21 0333  WBC 4.6 6.3 8.4  HGB 8.8* 9.0* 9.6*  HCT 28.1* 29.1* 30.9*  PLT 276 288 259   BMET Recent Labs    07/27/21 0431 07/28/21 0637 07/29/21 0333  NA 137 139 139  K 3.3* 3.8 3.8  CL 107 109 109  CO2 20* 24 24  GLUCOSE 242* 289* 264*  BUN 27* 23* 28*  CREATININE 0.44 <0.30* 0.34*  CALCIUM 7.8* 7.7* 8.0*   LFT Recent Labs    07/29/21 0333  PROT 5.7*  ALBUMIN 2.1*  AST 13*  ALT 11  ALKPHOS 55  BILITOT 0.6   PT/INR No results for input(s): LABPROT, INR in the last 72 hours.  Studies/Results: DG Abd 1 View  Result Date: 07/29/2021 CLINICAL DATA:  Follow-up ileus EXAM: ABDOMEN - 1 VIEW COMPARISON:  Multiple prior radiographs including most recent dated July 27, 2021 from FINDINGS: NG tube with distal tip overlying the stomach. Gaseous dilated colonic loop in the right abdomen measuring up to 11 cm, unchanged. There also dilated bowel lobes in the mid abdomen and left lower quadrant. IMPRESSION: 1. No significant interval change and multiple distended bowel loops concerning for ileus/obstruction. 2. NG tube with side port and distal tip overlying the gastric body. Electronically Signed   By: Keane Police D.O.   On: 07/29/2021 09:12   DG CHEST PORT 1 VIEW  Result Date: 07/27/2021 CLINICAL DATA:  Pleural effusion. Additional history provided: Pleural effusions status post thoracentesis. EXAM: PORTABLE CHEST 1 VIEW COMPARISON:  Prior chest radiographs 07/24/2021 and earlier. FINDINGS: Right chest infusion port catheter with tip projecting at the level of the superior cavoatrial junction. An enteric tube is present, extending below the level of the left hemidiaphragm with tip excluded from the field of view. The cardiomediastinal silhouette is unchanged. No significant right pleural effusion is appreciated status post thoracentesis. A small left pleural effusion is unchanged. Mild  bibasilar atelectasis. No evidence of pneumothorax. No acute bony abnormality  identified. IMPRESSION: No significant residual right pleural effusion is appreciated status post thoracentesis. No evidence of pneumothorax. Persistent small left pleural effusion. Mild bibasilar atelectasis. Electronically Signed   By: Kellie Simmering D.O.   On: 07/27/2021 13:54   US THORACENTESIS ASP PLEURAL SPACE W/IMG GUIDE  Result Date: 07/27/2021 INDICATION: History of pancreatic cancer, shortness of breast, pleural effusion seen on previous chest x-ray. Request for therapeutic and diagnostic thoracentesis. EXAM: ULTRASOUND GUIDED RIGHT THORACENTESIS MEDICATIONS: 10 mL 1% lidocaine COMPLICATIONS: None immediate. PROCEDURE: An ultrasound guided thoracentesis was thoroughly discussed with the patient and questions answered. The benefits, risks, alternatives and complications were also discussed. The patient understands and wishes to proceed with the procedure. Written consent was obtained. Ultrasound was performed to localize and mark an adequate pocket of fluid in the right chest. The area was then prepped and draped in the normal sterile fashion. 1% Lidocaine was used for local anesthesia. Under ultrasound guidance a 6 Fr Safe-T-Centesis catheter was introduced. Thoracentesis was performed. The catheter was removed and a dressing applied. FINDINGS: A total of approximately 600 cc of hazy amber fluid was removed. Samples were sent to the laboratory as requested by the clinical team. Post procedure chest X-ray reviewed, negative for pneumothorax. IMPRESSION: Successful ultrasound guided right thoracentesis yielding 600 cc of pleural fluid. Read by: Durenda Guthrie, PA-C Electronically Signed   By: Ruthann Cancer M.D.   On: 07/27/2021 14:52    Impression  Colonic ileus WBC 8.4 HGB 9.6 Platelets 259 BUN 28 Cr 0.34  Potassium 3.8  Magnesium 2.0   AB xray 07/29/21 without significant interval change and multiple distended bowel loops .  General surgery consulted, patient not a candidate for surgery due to  peritoneal carcinomatosis Patient appropriately on fentanyl and opioids for metastatic pancreatic cancer, however this worsens colonic ileus.   Stage IV adenocarcinoma of pancreas ([place]) with malignant pleural effusion/ascites   Acute respiratory failure with hypoxia [facility]) Patient continues to have hypoxia and tachycardia, in ICU.   Protein calorie malnutrition    Plan Due to multiple comorbidities and hemodynamic instability with malignant pleural effusion and tachycardia, hypoxia patient's not a candidate for decompression at this time.  Will treat medically and conservatively Continue to maintain magnesium above 2 and potassium at 4-4.5.  Attempt to minimize narcotic use. NG tube as tolerated for decompression Encourage early ambulation.  Will GI will continue to follow, if her condition changes can reconsider decompression.   However at this time patient has very poor prognosis.   LOS: 14 days   Vladimir Crofts  PA-C 07/29/2021, 11:29 AM  Contact #  [phone]

## 2021-07-29 NOTE — Progress Notes (Addendum)
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  She has undergone extensive treatment since May 2021.  Her cancer seems to have progressed since then.    Events in the last 7 days-patient continues to be tachycardic with a resting heart rate in the 130s to 140s with severe deconditioning and increasing in heart rate with any activity.  Her nausea and vomiting had subsided initially however she had not had any bowel movement so she was given MiraLAX Dulcolax and lactulose for 1 day.  She started having diarrhea and had large bowel movements and a rectal tube was placed at that point.  All the laxatives were stopped.  She received laxatives only 1 day.  Then she started to have intractable nausea vomiting NG tube was placed and she was moved to stepdown unit.  Stat CT of the abdomen and pelvis without contrast(she has severe contrast allergy) possible ischemic bowel and colonic ileus.  PCCM and general surgery was consulted. When she was moved to stepdown unit her respiratory rate was in the 40s and her heart rate was in the 150s. Dr. Barry Dienes saw the patient she did not think patient has ischemic bowel and even if she did have ischemic bowel she was not a candidate for surgery due to peritoneal carcinomatosis.  Her lactic acid was normal. PCCM followed the patient over the weekend and signed off on 07/25/2021. Patient also developed increasing right pleural effusion and increasing ascites. She received albumin infusion for 6 doses. Xarelto was stopped and heparin was started as there was some blood-tinged urine as well as NG tube  drainage.  And she was n.p.o. due to colonic ileus and nausea vomiting with NG tube in place. Her medications were narrowed down on 07/23/2021 to include only the ones she really needs it and was able to be converted to IV.   07/26/2021 2.9 L of fluid taken out paracentesis 07/27/2021 600 cc of hazy amber-colored right thoracentesis fluid sent for studies Patient started on TPN Heparin stopped Lovenox started Patient pulled out NG tube on 07/26/2021 this was replaced on 07/27/2021 as she again started with bilious vomiting   Daughter involved very involved with patient's care and patient remains full code in spite of multiple consultants PCCM oncology palliative care General surgery and hospitalist team explaining to her that her prognosis is extremely poor prognosis.   Assessment & Plan:   Principal Problem:   Acute respiratory failure with hypoxia (HCC) Active Problems:   Type 2 diabetes mellitus (HCC)   Stage IV adenocarcinoma of pancreas (HCC)   Essential hypertension, benign   Abdominal ascites   Pleural effusion on right   Hyponatremia   Internal jugular (IJ) vein thromboembolism, chronic, right (HCC)   Goals of care, counseling/discussion   Intravascular volume depletion   Malnutrition of moderate degree   Ileus (HCC)   Hypokalemia   Hypophosphatemia   Hypomagnesemia  #1 acute hypoxemic respiratory failure secondary to large right pleural effusion status postthoracentesis 07/27/2021 -Improved.  Also in part secondary to poor diaphragmatic excursion with massive abdominal distention. -NG tube placed, status post thoracenteses. -Currently with sats of 100% on room air.  2.  Stage IV adenocarcinoma of the pancreas diagnosed 2021 with mets to the peritoneum, liver. -Patient undergone multiple rounds of chemotherapy and radiation. -Patient with progression of disease. -Patient being followed by oncology Dr. Marin Olp and patient deemed not a candidate for any chemotherapy at this  time. -CT abdomen 07/24/2021-large right effusion substantial ascites extensive peritoneal metastatic disease large cystic pelvic masses pancreatic tail tumor and heterogenicity in the liver suspicious for hepatic metastatic lesions atelectasis in the left lower lobe right middle lobe and right lower lobe stable small pericardial effusion. -Patient status post paracentesis and thoracentesis. -Patient noted to have pulled out NG tube 07/26/2021 and KUB done with worsening colonic ileus and as such NG tube placed back in as patient did have ongoing nausea and emesis. -No changes colonic ileus. -Palliative care consulted however due to religious believes patient's daughter believes patient is going to be healed and recommended ongoing aggressive treatment at this time.  3.  Malignant ascites -Status post paracentesis 07/26/2021 with cytology consistent with malignant cells consistent with metastatic adenocarcinoma. -2.9 L of fluid removed. -Oncology following  4.  Sinus tachycardia -Likely multifocal secondary to acute illnesses including progressive stage IV adenocarcinoma of the pancreas, colonic ileus, pleural effusion. -2D echo done with normal EF. -Placed on Lopressor 5 mg IV every 6 hours.   -Follow.  5.  Right IJ thrombus -Continue full dose Lovenox.  6.  Fever -Likely tumor fever. -Status post course of cefepime and Flagyl. -No further antibiotics needed.  7.  Colonic ileus -Likely secondary to stage IV adenocarcinoma of the pancreas with peritoneal mets in the setting of opioid pain medication. -Patient noted to have pulled out NG tube 07/26/2021 with KUB showing worsening colonic ileus. -NG tube placed back in 07/27/2021 with a output of 200 cc over the past 24 hours. -Per NG tube of 350 cc over the past 24 hours.   -Rectal tube with loose brown stool.   -Patient very weak making mobility difficult.   -Repeat KUB done this morning with no significant change with colonic ileus.    -Continue NG tube.   -PT/OT.   -We will consult with GI to see if patient may benefit from a flex sigmoidoscopy.  8.  Hypokalemia/hypophosphatemia/hypomagnesemia -Patient on TPN, being repleted per pharmacy.  9.  Moderate protein calorie malnutrition -On TPN. -Thiamine been added to TPN per RD recommendations due to concern for refeeding syndrome.  10.  Hyperglycemia -Likely secondary to TPN. -Insulin being adjusted in TPN per pharmacy. -We will give a one-time dose of Semglee 10 units x 1. -Per pharmacy.   DVT prophylaxis: Lovenox Code Status: Full Family Communication: Updated daughter at bedside. Disposition:   Status is: Inpatient  Remains inpatient appropriate because: Current ongoing inpatient care.  Patient with ileus versus small bowel obstruction currently with NG tube placement, currently on TPN, not stable for discharge.       Consultants:  Palliative care: Dr. Rowe Pavy 07/16/2021 PCCM: Dr.Meier 07/24/2021 General surgery: Dr. Barry Dienes 07/24/2021  Procedures:  Ultrasound-guided thoracentesis 07/27/2021 per Dr. Serafina Royals, IR Upper extremity Dopplers 07/20/2021 Lower extremity Dopplers 07/19/2021 2D echo 07/23/2021 Ultrasound-guided paracentesis: Dr. Laurence Ferrari IR 07/17/2021--3.4 L of yellow fluid removed Ultrasound-guided paracentesis 07/26/2021 2.9 L of hazy fluid removed per Dr.Mugweru, IR CT abdomen and pelvis 07/07/2021, 07/24/2021 KUB 07/29/2021  Antimicrobials:  IV cefepime 07/24/2021>>>> 07/27/2021 IV Flagyl 07/24/2021>>>>> 07/27/2021   Subjective: Patient laying in bed eyes open.  NG tube in place with bilious drainage.  Patient not really answering question.  Somewhat  drowsy.  Daughter at bedside.  Rectal tube with brown loose stools.  Objective: Vitals:   07/29/21 0400 07/29/21 0500 07/29/21 0600 07/29/21 0800  BP: 122/77   (!) 121/104  Pulse: (!) 139 (!) 138 (!) 140 (!) 127  Resp: (!) 28 (!) 28 (!) 25 20  Temp:   98.6 F (37 C) (!) 100.7 F (38.2 C)   TempSrc:   Axillary Axillary  SpO2: 95% 95% 97% 97%  Weight:      Height:        Intake/Output Summary (Last 24 hours) at 07/29/2021 1017 Last data filed at 07/29/2021 0900 Gross per 24 hour  Intake 1692.75 ml  Output 425 ml  Net 1267.75 ml    Filed Weights   07/26/21 0500 07/27/21 0500 07/28/21 0500  Weight: 93.8 kg 94.1 kg 94.1 kg    Examination:  General exam: NG tube in place, bilious drainage. Respiratory system: CTA B anterior lung fields.  No wheezes, no crackles, no rhonchi.  Normal respiratory effort. Cardiovascular system: Tachycardia.  No murmurs rubs or gallops.  1-2+ bilateral lower extremity edema. Gastrointestinal system: Abdomen distended, mildly soft, some slight tenderness to diffuse palpation, hypoactive bowel sounds.  No rebound.  No guardin. Central nervous system: Drowsy.  Moving extremities spontaneously.  No focal neurological deficits. Extremities: Symmetric 5 x 5 power. Skin: No rashes, lesions or ulcers Psychiatry: Judgement and insight unable to assess.. Mood & affect appropriate.     Data Reviewed: I have personally reviewed following labs and imaging studies  CBC: Recent Labs  Lab 07/23/21 0345 07/24/21 0459 07/25/21 0832 07/26/21 0500 07/27/21 0431 07/28/21 0637 07/29/21 0333  WBC 3.2*   < > 2.1* 2.6* 4.6 6.3 8.4  NEUTROABS 2.1  --   --   --   --   --   --   HGB 10.7*   < > 9.6* 8.6* 8.8* 9.0* 9.6*  HCT 34.7*   < > 31.0* 27.7* 28.1* 29.1* 30.9*  MCV 86.1   < > 84.9 85.2 84.9 85.6 85.4  PLT 252   < > 283 275 276 288 259   < > = values in this interval not displayed.     Basic Metabolic Panel: Recent Labs  Lab 07/25/21 0832 07/26/21 0500 07/27/21 0431 07/28/21 0637 07/29/21 0333  NA 131* 136 137 139 139  K 3.7 3.3* 3.3* 3.8 3.8  CL 100 100 107 109 109  CO2 19* 20* 20* 24 24  GLUCOSE 260* 245* 242* 289* 264*  BUN 49* 38* 27* 23* 28*  CREATININE 0.61 0.47 0.44 <0.30* 0.34*  CALCIUM 7.8* 8.3* 7.8* 7.7* 8.0*  MG  --  2.2  2.2 2.1 2.0  PHOS  --   --  1.1* 1.7* 2.2*     GFR: Estimated Creatinine Clearance: 94 mL/min (A) (by C-G formula based on SCr of 0.34 mg/dL (L)).  Liver Function Tests: Recent Labs  Lab 07/25/21 0832 07/26/21 0500 07/27/21 0431 07/28/21 0637 07/29/21 0333  AST 15 13* 13* 11* 13*  ALT 16 16 13 12 11   ALKPHOS 63 54 53 52 55  BILITOT 1.2 1.5* 1.4* 0.7 0.6  PROT 5.7* 5.9* 5.4* 5.4* 5.7*  ALBUMIN 2.2* 2.9* 2.4* 2.2* 2.1*     CBG: Recent Labs  Lab 07/28/21 1946 07/28/21 2312 07/29/21 0330 07/29/21 0332 07/29/21 0814  GLUCAP 174* 157* 231* 222* 328*      Recent Results (from the past 240 hour(s))  Urine Culture     Status: Abnormal  Collection Time: 07/20/21 12:29 PM   Specimen: Urine, Catheterized  Result Value Ref Range Status   Specimen Description   Final    URINE, CATHETERIZED Performed at [facility] [address], [place], [place] [address]    Special Requests   Final    Immunocompromised Performed at [facility], [place] [address], [place], [place] [address]    Culture (A)  Final    <10,000 COLONIES/mL INSIGNIFICANT GROWTH Performed at [facility] [address], [place], [place] [address]    Report Status 07/21/2021 FINAL  Final  MRSA Next Gen by PCR, Nasal     Status: Abnormal   Collection Time: 07/24/21  5:58 PM   Specimen: Nasal Mucosa; Nasal Swab  Result Value Ref Range Status   MRSA by PCR Next Gen DETECTED (A) NOT DETECTED Final    Comment: RESULT CALLED TO, READ BACK BY AND VERIFIED WITH: CALDERON,E RN @2141  ON 07/24/21 JACKSON,K (NOTE) The GeneXpert MRSA Assay (FDA approved for NASAL specimens only), is one component of a comprehensive MRSA colonization surveillance program. It is not intended to diagnose MRSA infection nor to guide or monitor treatment for MRSA infections. Test performance is not FDA approved in patients less than 15 years old. Performed at [facility],  [place] [address] [place], [place] [address]   Culture, blood (routine x 2)     Status: None   Collection Time: 07/24/21  7:57 PM   Specimen: BLOOD  Result Value Ref Range Status   Specimen Description   Final    BLOOD BLOOD LEFT HAND Performed at [facility] [address] [place], [place] [address]    Special Requests   Final    BOTTLES DRAWN AEROBIC ONLY Blood Culture adequate volume Performed at [facility] [address], [place], [place] [address]    Culture   Final    NO GROWTH 5 DAYS Performed at [facility], [place] [address] [place], [place] [address]    Report Status 07/29/2021 FINAL  Final  Culture, blood (routine x 2)     Status: None   Collection Time: 07/24/21  7:57 PM   Specimen: BLOOD  Result Value Ref Range Status   Specimen Description   Final    BLOOD LEFT WRIST Performed at [facility] [address], [place], [place] [address]    Special Requests   Final    BOTTLES DRAWN AEROBIC ONLY Blood Culture adequate volume Performed at [facility] [address] [place], [place] [address]    Culture   Final    NO GROWTH 5 DAYS Performed at [facility], [place] [address] [place], [place] [address]    Report Status 07/29/2021 FINAL  Final  Urine Culture     Status: Abnormal   Collection Time: 07/25/21  1:36 AM   Specimen: In/Out Cath Urine  Result Value Ref Range Status   Specimen Description   Final    IN/OUT CATH URINE Performed at [facility] [address], [place], [place] [address]    Special Requests   Final    NONE Performed at [facility], [place] [address] [place],  [address]    Culture MULTIPLE SPECIES PRESENT, SUGGEST RECOLLECTION (A)  Final   Report Status 07/26/2021 FINAL  Final  Culture, body fluid w Gram Stain-bottle     Status: None (Preliminary result)   Collection  Time: 07/26/21  2:29 PM    Specimen: Fluid  Result Value Ref Range Status   Specimen Description FLUID PERITONEAL  Final   Special Requests BOTTLES DRAWN AEROBIC AND ANAEROBIC  Final   Culture   Final    NO GROWTH 3 DAYS Performed at [facility], [place] [address] [place], [place] [address]    Report Status PENDING  Incomplete  Gram stain     Status: None   Collection Time: 07/26/21  2:29 PM   Specimen: Fluid  Result Value Ref Range Status   Specimen Description FLUID PERITONEAL  Final   Special Requests NONE  Final   Gram Stain   Final    MODERATE WBC SEEN NO ORGANISMS SEEN Performed at [facility], [address] [place], [place] [address]    Report Status 07/28/2021 FINAL  Final  Culture, body fluid w Gram Stain-bottle     Status: None (Preliminary result)   Collection Time: 07/27/21  1:15 PM   Specimen: Fluid  Result Value Ref Range Status   Specimen Description FLUID PLEURAL  Final   Special Requests   Final    BOTTLES DRAWN AEROBIC AND ANAEROBIC Blood Culture adequate volume   Culture   Final    NO GROWTH 2 DAYS Performed at [facility], [place] [address], [place], [place] [address]    Report Status PENDING  Incomplete  Gram stain     Status: None (Preliminary result)   Collection Time: 07/27/21  1:15 PM   Specimen: Fluid  Result Value Ref Range Status   Specimen Description FLUID PLEURAL  Final   Special Requests   Final    BOTTLES DRAWN AEROBIC AND ANAEROBIC Blood Culture adequate volume   Gram Stain   Final    NO SQUAMOUS EPITHELIAL CELLS SEEN NO WBC SEEN NO ORGANISMS SEEN Performed at [facility], [address] [place], [place] [address]    Report Status PENDING  Incomplete          Radiology Studies: DG Abd 1 View  Result Date: 07/29/2021 CLINICAL DATA:  Follow-up ileus EXAM: ABDOMEN - 1 VIEW COMPARISON:  Multiple prior radiographs including most recent dated July 27, 2021 from FINDINGS: NG tube with distal tip overlying the stomach. Gaseous  dilated colonic loop in the right abdomen measuring up to 11 cm, unchanged. There also dilated bowel lobes in the mid abdomen and left lower quadrant. IMPRESSION: 1. No significant interval change and multiple distended bowel loops concerning for ileus/obstruction. 2. NG tube with side port and distal tip overlying the gastric body. Electronically Signed   By: Keane Police D.O.   On: 07/29/2021 09:12   DG Abd 1 View  Result Date: 07/27/2021 CLINICAL DATA:  NG tube placement EXAM: ABDOMEN - 1 VIEW COMPARISON:  None. FINDINGS: Nasogastric tube with the tip projecting over the stomach. Gaseous distension the small bowel and colon. No evidence of pneumoperitoneum, portal venous gas or pneumatosis. No pathologic calcifications along the expected course of the ureters. No acute osseous abnormality. IMPRESSION: Nasogastric tube with the tip projecting over the stomach. Electronically Signed   By: Kathreen Devoid M.D.   On: 07/27/2021 11:22   DG CHEST PORT 1 VIEW  Result Date: 07/27/2021 CLINICAL DATA:  Pleural effusion. Additional history provided: Pleural effusions status post thoracentesis. EXAM: PORTABLE CHEST 1 VIEW COMPARISON:  Prior chest radiographs 07/24/2021 and earlier. FINDINGS: Right chest infusion port catheter with tip projecting at the level of the  superior cavoatrial junction. An enteric tube is present, extending below the level of the left hemidiaphragm with tip excluded from the field of view. The cardiomediastinal silhouette is unchanged. No significant right pleural effusion is appreciated status post thoracentesis. A small left pleural effusion is unchanged. Mild bibasilar atelectasis. No evidence of pneumothorax. No acute bony abnormality identified. IMPRESSION: No significant residual right pleural effusion is appreciated status post thoracentesis. No evidence of pneumothorax. Persistent small left pleural effusion. Mild bibasilar atelectasis. Electronically Signed   By: Kellie Simmering D.O.   On:  07/27/2021 13:54   US THORACENTESIS ASP PLEURAL SPACE W/IMG GUIDE  Result Date: 07/27/2021 INDICATION: History of pancreatic cancer, shortness of breast, pleural effusion seen on previous chest x-ray. Request for therapeutic and diagnostic thoracentesis. EXAM: ULTRASOUND GUIDED RIGHT THORACENTESIS MEDICATIONS: 10 mL 1% lidocaine COMPLICATIONS: None immediate. PROCEDURE: An ultrasound guided thoracentesis was thoroughly discussed with the patient and questions answered. The benefits, risks, alternatives and complications were also discussed. The patient understands and wishes to proceed with the procedure. Written consent was obtained. Ultrasound was performed to localize and mark an adequate pocket of fluid in the right chest. The area was then prepped and draped in the normal sterile fashion. 1% Lidocaine was used for local anesthesia. Under ultrasound guidance a 6 Fr Safe-T-Centesis catheter was introduced. Thoracentesis was performed. The catheter was removed and a dressing applied. FINDINGS: A total of approximately 600 cc of hazy amber fluid was removed. Samples were sent to the laboratory as requested by the clinical team. Post procedure chest X-ray reviewed, negative for pneumothorax. IMPRESSION: Successful ultrasound guided right thoracentesis yielding 600 cc of pleural fluid. Read by: Durenda Guthrie, PA-C Electronically Signed   By: Ruthann Cancer M.D.   On: 07/27/2021 14:52        Scheduled Meds:  chlorhexidine  15 mL Mouth Rinse BID   Chlorhexidine Gluconate Cloth  6 each Topical Daily   enoxaparin (LOVENOX) injection  150 mg Subcutaneous Daily   fentaNYL  1 patch Transdermal Q72H   insulin aspart  0-20 Units Subcutaneous Q4H   insulin glargine-yfgn  10 Units Subcutaneous Once   mouth rinse  15 mL Mouth Rinse q12n4p   pantoprazole (PROTONIX) IV  40 mg Intravenous Q12H   scopolamine  1 patch Transdermal Q72H   sodium chloride flush  3 mL Intravenous Q12H   Continuous Infusions:  sodium  chloride Stopped (07/27/21 1715)   potassium PHOSPHATE IVPB (in mmol) 15 mmol (07/29/21 1000)   promethazine (PHENERGAN) injection (IM or IVPB) 12.5 mg (07/24/21 1511)   TPN ADULT (ION) 60 mL/hr at 07/29/21 0900   TPN ADULT (ION)       LOS: 14 days    Time spent: 40 minutes    Irine Seal, MD [facility]   To contact the attending provider between 7A-7P or the covering provider during after hours 7P-7A, please log into the web site [email or web address] and access using universal [place] password for that web site. If you do not have the password, please call the hospital operator.  07/29/2021, 10:17 AM

## 2021-07-29 NOTE — Progress Notes (Signed)
Daily Progress Note   Patient Name: Angela Wilson       Date: 07/29/2021 DOB: October 21, 1965  Age: 55 y.o. MRN#: [id number] Attending Physician: Eugenie Filler, MD Primary Care Physician: Just, Laurita Quint, FNP (Inactive) Admit Date: 07/05/2021  Reason for Consultation/Follow-up: Establishing goals of care  Subjective: Opens eyes when her name is called, daughter  at bedside.    Length of Stay: 14  Current Medications: Scheduled Meds:   chlorhexidine  15 mL Mouth Rinse BID   Chlorhexidine Gluconate Cloth  6 each Topical Daily   enoxaparin (LOVENOX) injection  150 mg Subcutaneous Daily   fentaNYL  1 patch Transdermal Q72H   insulin aspart  0-20 Units Subcutaneous Q4H   insulin glargine-yfgn  10 Units Subcutaneous Once   mouth rinse  15 mL Mouth Rinse q12n4p   metoprolol tartrate  5 mg Intravenous Q6H   pantoprazole (PROTONIX) IV  40 mg Intravenous Q12H   scopolamine  1 patch Transdermal Q72H   sodium chloride flush  3 mL Intravenous Q12H    Continuous Infusions:  sodium chloride Stopped (07/27/21 1715)   potassium PHOSPHATE IVPB (in mmol) 15 mmol (07/29/21 1000)   promethazine (PHENERGAN) injection (IM or IVPB) 12.5 mg (07/24/21 1511)   TPN ADULT (ION) 60 mL/hr at 07/29/21 0900   TPN ADULT (ION)      PRN Meds: sodium chloride, acetaminophen **OR** acetaminophen, HYDROmorphone (DILAUDID) injection, lip balm, LORazepam, metoprolol tartrate, ondansetron **OR** ondansetron (ZOFRAN) IV, phenol, prochlorperazine, promethazine (PHENERGAN) injection (IM or IVPB), sodium chloride flush  Physical Exam         Appears chronically ill fatigued Regular work of breathing Has O2 What Cheer Abdomen distended Has edema  has NG tube Vital Signs: BP (!) 121/104 (BP Location: Left Arm)   Pulse  (!) 127   Temp (!) 100.7 F (38.2 C) (Axillary)   Resp 20   Ht 5\' 6"  (1.676 m)   Wt 94.1 kg   SpO2 97%   BMI 33.48 kg/m  SpO2: SpO2: 97 % O2 Device: O2 Device: Room Air O2 Flow Rate: O2 Flow Rate (L/min): 0 L/min  Intake/output summary:  Intake/Output Summary (Last 24 hours) at 07/29/2021 1126 Last data filed at 07/29/2021 0900 Gross per 24 hour  Intake 1692.75 ml  Output 425 ml  Net 1267.75 ml    LBM:  Last BM Date: 07/28/21 Baseline Weight: Weight: 100.7 kg Most recent weight: Weight: 94.1 kg PPS 30%      Palliative Assessment/Data:      Patient Active Problem List   Diagnosis Date Noted   Malnutrition of moderate degree 07/28/2021   Ileus (HCC)    Hypokalemia    Hypophosphatemia    Hypomagnesemia    Intravascular volume depletion 07/18/2021   Internal jugular (IJ) vein thromboembolism, chronic, right ([place]) 07/16/2021   Goals of care, counseling/discussion 07/16/2021   Acute respiratory failure with hypoxia ([place]) 07/06/2021   Abdominal ascites 07/18/2021   Pleural effusion on right 07/14/2021   Hyponatremia 06/19/2021   Genetic testing 05/12/2020   Family history of breast cancer    Family history of ovarian cancer    Family history of kidney cancer    Family history of brain cancer    Essential hypertension, benign 04/06/2020   Pancreatic cancer ([place]) 02/14/2020   Cancer associated pain 02/13/2020   Stage IV adenocarcinoma of pancreas ([place]) 02/03/2020   Screening for colon cancer 01/07/2020   Encounter for screening mammogram for malignant neoplasm of breast 01/07/2020   Need for Tdap vaccination 01/07/2020   Pancreatic mass 12/30/2019   Nephrolithiasis 12/20/2012   GERD (gastroesophageal reflux disease) 12/20/2012   Type 2 diabetes mellitus ([place]) 11/09/2012    Palliative Care Assessment & Plan   Patient Profile:    Assessment:  55 yo lady with life limiting illness of pancreatic cancer, ascites, abdominal pain, LE edema.    Recommendations/Plan: Continue current care.  Remains full code/full scope  Continue efforts to assist with documentation outlining her medical illness to support her son and daughter's applications for visitation visa. Today, Rehab asks for another letter and e mail to be sent to the [facility] for assistance with emergency visa appointment for the patient's daughter.   Goals of Care and Additional Recommendations: Limitations on Scope of Treatment: Full Scope Treatment  Code Status:    Code Status Orders  (From admission, onward)           Start     Ordered   07/17/2021 2022  Full code  Continuous        07/10/2021 2024           Code Status History     Date Active Date Inactive Code Status Order ID Comments User Context   02/14/2020 0213 02/18/2020 2347 Full Code [id number]  Clance Boll, MD Inpatient       Prognosis: Likely weeks at best, she continues to have decline overall and is at high risk for acute decompensation.  Discharge Planning: To Be Determined although I fear this will be a terminal admission.  Care plan was discussed with IDT.    Thank you for allowing the Palliative Medicine Team to assist in the care of this patient.   Total Time 25 Prolonged Time Billed  no   Greater than 50%  of this time was spent counseling and coordinating care related to the above assessment and plan.  Loistine Chance, MD  Please contact Palliative Medicine Team phone at [phone] for questions and concerns.

## 2021-07-29 NOTE — Evaluation (Signed)
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  She has undergone extensive treatment since May 2021.  Her cancer seems to have progressed since then   Clinical Impression   Ms. Verdie Wilms is a 55 year old woman who presents with lethargy, generalized weakness, poor activity tolerance, decreased cardiopulmonary endurance and presumed impaired balance. Patient is able to open her eyes but unable to verbalize. Her cognition is unknown. She is unable to physically follow commands including squeezing therapist fingers. She performed elbow flexion x 1 in each arm with max verbal and tactile cues otherwise all movement was performed passively by therapist. Patient's HR  139 and RR between 30-35. Patient is total assist for bed mobility and total assist for ADLs with presence of Ng tube and rectal tube. Therapist provided education to family in regards to PROM to perform and edema reduction in RUE. Patient is noted to have a poor prognosis by all medical professionals but patient's family wants to continue full scope of care including therapy. Patient will benefit from skilled OT services while in hospital to attempt improve deficits and learn compensatory strategies as needed in order to reduce caregiver burden. Rehab potential is guarded at this time but OT will continue to follow acutely.      Recommendations for follow up therapy are one component of a multi-disciplinary discharge planning process, led by the attending physician.  Recommendations may be updated based on patient status, additional functional criteria and insurance authorization.   Follow Up Recommendations   Skilled nursing-short term rehab (<3 hours/day)    Assistance Recommended at Discharge Frequent or constant Supervision/Assistance  Functional Status Assessment  Patient has had a recent decline in their functional status and/or demonstrates limited ability to make significant improvements in function in a reasonable and predictable amount of time  Equipment Recommendations  Other (comment) (tbd)    Recommendations for Other Services       Precautions / Restrictions Precautions Precautions: Fall Precaution Comments: NG Tube, Rectal Tube Restrictions Weight Bearing Restrictions: No      Mobility Bed Mobility Overal bed mobility: Needs Assistance Bed Mobility: Rolling Rolling: Total assist;+2 for physical assistance;+2 for safety/equipment         General bed mobility comments: Total assist for bed mobility for rolling to initiate cleaning patient up. Nurse tech's in room to takeover task.    Transfers                          Balance                                           ADL either performed or assessed with clinical judgement   ADL                                         General ADL Comments: Patient is total assist for all ADLs including feeding and toileting (NG tube and NPO and rectal tube) Incontinent.     Vision   Additional Comments: unknown. Limited eye contact.  able to look left and right briefly.     Perception     Praxis      Pertinent Vitals/Pain Pain Assessment: PAINAD Breathing: occasional labored breathing, short period of hyperventilation Negative Vocalization: none Facial Expression: smiling or inexpressive Body Language: relaxed Consolability: no need to console PAINAD Score: 1 Pain Location: abdomen     Hand Dominance     Extremity/Trunk Assessment Upper Extremity Assessment Upper Extremity Assessment: RUE deficits/detail;LUE deficits/detail RUE Deficits / Details: PROM in WNL in  all joints - grossly performed elbow flexion twice - edema in upper arm from IV infiltration LUE Deficits / Details: PROM in WNL minimal active movement at elbow   Lower Extremity Assessment Lower Extremity Assessment: Defer to PT evaluation       Communication Communication Communication: Prefers language other than [place] (daughter present and providing translation throughout session)   Cognition Arousal/Alertness: Lethargic Behavior During Therapy: Flat affect Overall Cognitive Status: Difficult to assess                                 General Comments: pt was 95% lethargic, non verbal and <5% eye contact     General Comments       Exercises Other Exercises Other Exercises: educated daughter on how to perform PROM and edema reduction in upper extremities. Demonstrated LE exercises as well.   Shoulder Instructions      Home Living Family/patient expects to be discharged to:: Private residence Living Arrangements: Children;Other relatives Available Help at Discharge: Family;Available 24 hours/day Type of Home: House Home Access: Stairs to enter [employer] of Steps: 3 Entrance Stairs-Rails: None Home Layout: Two level;Able to live on main level with bedroom/bathroom Alternate Level Stairs-Number of Steps: 14 Alternate Level Stairs-Rails: Left Bathroom Shower/Tub: Tub/shower unit;Walk-in shower   Bathroom Toilet: Standard Bathroom Accessibility: Yes   Home Equipment: None   Additional Comments: Pt was mobile, very quick decline in past ~5 days . denies falls at home.      Prior Functioning/Environment Prior Level of Function : Independent/Modified Independent             Mobility Comments: pt does not do any driving/going out shopping but independent around the home.          OT Problem List: Decreased strength;Decreased range of motion;Decreased activity tolerance;Impaired balance (sitting and/or standing);Pain;Increased  edema;Impaired UE functional use;Cardiopulmonary status limiting activity;Decreased cognition;Decreased knowledge of precautions;Impaired tone;Decreased coordination      OT Treatment/Interventions: Self-care/ADL training;Therapeutic exercise;DME and/or AE instruction;Therapeutic activities;Balance training;Patient/family education    OT Goals(Current goals can be found in the care plan section) Acute Rehab OT Goals Patient Stated Goal: improve strength and get up and move OT Goal Formulation: With family Time For Goal Achievement: 08/12/21 Potential to Achieve Goals: Fair  OT Frequency: Min 2X/week   Barriers to D/C:            Co-evaluation              AM-PAC OT "6 Clicks" Daily Activity     Outcome Measure Help from another person eating meals?: Total Help from another person taking care of personal grooming?: Total Help from another person toileting, which includes using toliet, bedpan, or urinal?: Total Help from another person bathing (including washing, rinsing, drying)?: Total Help from another person to put on and taking off regular upper body clothing?: Total Help from another person to put on and taking off regular lower body  clothing?: Total 6 Click Score: 6   End of Session Nurse Communication:  (okay to see)  Activity Tolerance: Patient limited by lethargy Patient left: in bed;with call bell/phone within reach;with nursing/sitter in room;with family/visitor present  OT Visit Diagnosis: Pain;Muscle weakness (generalized) (M62.81)                Time: 2778-2423 OT Time Calculation (min): 24 min Charges:  OT General Charges $OT Visit: 1 Visit OT Evaluation $OT Eval Low Complexity: 1 Low OT Treatments $Therapeutic Exercise: 8-22 mins  Latara Micheli, OTR/L [place]  Office [phone] Pager: [place] 07/29/2021, 4:52 PM

## 2021-07-30 ENCOUNTER — Inpatient Hospital Stay: Payer: 59

## 2021-07-30 ENCOUNTER — Inpatient Hospital Stay (HOSPITAL_COMMUNITY): Payer: 59

## 2021-07-30 DIAGNOSIS — J9601 Acute respiratory failure with hypoxia: Secondary | ICD-10-CM | POA: Diagnosis not present

## 2021-07-30 LAB — CBC WITH DIFFERENTIAL/PLATELET
Abs Immature Granulocytes: 0.65 10*3/uL — ABNORMAL HIGH (ref 0.00–0.07)
Basophils Absolute: 0.1 10*3/uL (ref 0.0–0.1)
Basophils Relative: 1 %
Eosinophils Absolute: 0.1 10*3/uL (ref 0.0–0.5)
Eosinophils Relative: 1 %
HCT: 30.7 % — ABNORMAL LOW (ref 36.0–46.0)
Hemoglobin: 9.3 g/dL — ABNORMAL LOW (ref 12.0–15.0)
Immature Granulocytes: 9 %
Lymphocytes Relative: 19 %
Lymphs Abs: 1.3 10*3/uL (ref 0.7–4.0)
MCH: 26 pg (ref 26.0–34.0)
MCHC: 30.3 g/dL (ref 30.0–36.0)
MCV: 85.8 fL (ref 80.0–100.0)
Monocytes Absolute: 0.3 10*3/uL (ref 0.1–1.0)
Monocytes Relative: 5 %
Neutro Abs: 4.6 10*3/uL (ref 1.7–7.7)
Neutrophils Relative %: 65 %
Platelets: 235 10*3/uL (ref 150–400)
RBC: 3.58 MIL/uL — ABNORMAL LOW (ref 3.87–5.11)
RDW: 20.3 % — ABNORMAL HIGH (ref 11.5–15.5)
WBC: 7 10*3/uL (ref 4.0–10.5)
nRBC: 3.1 % — ABNORMAL HIGH (ref 0.0–0.2)

## 2021-07-30 LAB — COMPREHENSIVE METABOLIC PANEL
ALT: 11 U/L (ref 0–44)
AST: 13 U/L — ABNORMAL LOW (ref 15–41)
Albumin: 2.1 g/dL — ABNORMAL LOW (ref 3.5–5.0)
Alkaline Phosphatase: 53 U/L (ref 38–126)
Anion gap: 7 (ref 5–15)
BUN: 36 mg/dL — ABNORMAL HIGH (ref 6–20)
CO2: 23 mmol/L (ref 22–32)
Calcium: 7.9 mg/dL — ABNORMAL LOW (ref 8.9–10.3)
Chloride: 109 mmol/L (ref 98–111)
Creatinine, Ser: <0.3 mg/dL — ABNORMAL LOW (ref 0.44–1.00)
Glucose, Bld: 231 mg/dL — ABNORMAL HIGH (ref 70–99)
Potassium: 3.3 mmol/L — ABNORMAL LOW (ref 3.5–5.1)
Sodium: 139 mmol/L (ref 135–145)
Total Bilirubin: 0.5 mg/dL (ref 0.3–1.2)
Total Protein: 5.8 g/dL — ABNORMAL LOW (ref 6.5–8.1)

## 2021-07-30 LAB — GLUCOSE, CAPILLARY
Glucose-Capillary: 115 mg/dL — ABNORMAL HIGH (ref 70–99)
Glucose-Capillary: 150 mg/dL — ABNORMAL HIGH (ref 70–99)
Glucose-Capillary: 199 mg/dL — ABNORMAL HIGH (ref 70–99)
Glucose-Capillary: 203 mg/dL — ABNORMAL HIGH (ref 70–99)
Glucose-Capillary: 217 mg/dL — ABNORMAL HIGH (ref 70–99)
Glucose-Capillary: 97 mg/dL (ref 70–99)

## 2021-07-30 LAB — PREALBUMIN: Prealbumin: 7.1 mg/dL — ABNORMAL LOW (ref 18–38)

## 2021-07-30 LAB — PHOSPHORUS: Phosphorus: 2.3 mg/dL — ABNORMAL LOW (ref 2.5–4.6)

## 2021-07-30 LAB — MAGNESIUM: Magnesium: 1.9 mg/dL (ref 1.7–2.4)

## 2021-07-30 MED ORDER — SODIUM CHLORIDE 0.9% FLUSH
10.0000 mL | INTRAVENOUS | Status: DC | PRN
  Administered 2021-08-03: 10 mL

## 2021-07-30 MED ORDER — SODIUM CHLORIDE 0.9 % IV SOLN: INTRAVENOUS | Status: DC

## 2021-07-30 MED ORDER — METOPROLOL TARTRATE 5 MG/5ML IV SOLN
7.5000 mg | Freq: Four times a day (QID) | INTRAVENOUS | Status: DC
  Administered 2021-07-31 – 2021-08-03 (×14): 7.5 mg via INTRAVENOUS
  Filled 2021-07-30 (×16): qty 10

## 2021-07-30 MED ORDER — SODIUM CHLORIDE 0.9 % IV SOLN
30.0000 mmol | Freq: Once | INTRAVENOUS | Status: AC
  Administered 2021-07-30: 30 mmol via INTRAVENOUS
  Filled 2021-07-30: qty 10

## 2021-07-30 MED ORDER — SODIUM CHLORIDE 0.9% FLUSH
10.0000 mL | Freq: Two times a day (BID) | INTRAVENOUS | Status: DC
  Administered 2021-07-30 – 2021-07-31 (×2): 10 mL
  Administered 2021-07-31: 20 mL
  Administered 2021-08-01: 10 mL
  Administered 2021-08-01: 20 mL
  Administered 2021-08-02: 10 mL
  Administered 2021-08-04: 40 mL

## 2021-07-30 MED ORDER — POTASSIUM CHLORIDE 10 MEQ/100ML IV SOLN
10.0000 meq | INTRAVENOUS | Status: AC
  Administered 2021-07-30 (×2): 10 meq via INTRAVENOUS
  Filled 2021-07-30 (×2): qty 100

## 2021-07-30 MED ORDER — TRAVASOL 10 % IV SOLN
INTRAVENOUS | Status: AC
  Filled 2021-07-30: qty 806.4

## 2021-07-30 NOTE — Progress Notes (Signed)
Subjective: Patient was seen and examined at bedside in presence of her daughter in ICU/ Her daughter reports that she is a little more awake than yesterday.  Was able to recognize her son who came to visit yesterday. As per her daughter the patient asked for water. Rectal tube has been removed. NG tube remains connected to wall suction.  Objective: Vital signs in last 24 hours: Temp:  [97.8 F (36.6 C)-100.3 F (37.9 C)] 98.2 F (36.8 C) (11/11 0800) Pulse Rate:  [106-136] 114 (11/11 0700) Resp:  [22-35] 22 (11/11 0700) BP: (115-152)/(60-76) 152/75 (11/11 0400) SpO2:  [97 %-100 %] 99 % (11/11 0700) Weight change:  Last BM Date: 07/28/21  PE: Ill-appearing, lethargic, responds to some commands(squeezes my fingers when instructed) GENERAL: Pale, tachycardic, tachypneic ABDOMEN: Distended, firm upper abdomen, slightly softer lower abdomen, bowel sounds not audible EXTREMITIES: Bipedal pitting edema  Lab Results: Results for orders placed or performed during the hospital encounter of 06/30/2021 (from the past 48 hour(s))  Glucose, capillary     Status: Abnormal   Collection Time: 07/28/21 11:35 AM  Result Value Ref Range   Glucose-Capillary 241 (H) 70 - 99 mg/dL    Comment: Glucose reference range applies only to samples taken after fasting for at least 8 hours.   Comment 1 Notify RN    Comment 2 Document in Chart   Glucose, capillary     Status: Abnormal   Collection Time: 07/28/21  3:20 PM  Result Value Ref Range   Glucose-Capillary 206 (H) 70 - 99 mg/dL    Comment: Glucose reference range applies only to samples taken after fasting for at least 8 hours.  Glucose, capillary     Status: Abnormal   Collection Time: 07/28/21  7:46 PM  Result Value Ref Range   Glucose-Capillary 174 (H) 70 - 99 mg/dL    Comment: Glucose reference range applies only to samples taken after fasting for at least 8 hours.  Glucose, capillary     Status: Abnormal   Collection Time: 07/28/21 11:12 PM   Result Value Ref Range   Glucose-Capillary 157 (H) 70 - 99 mg/dL    Comment: Glucose reference range applies only to samples taken after fasting for at least 8 hours.  Glucose, capillary     Status: Abnormal   Collection Time: 07/29/21  3:30 AM  Result Value Ref Range   Glucose-Capillary 231 (H) 70 - 99 mg/dL    Comment: Glucose reference range applies only to samples taken after fasting for at least 8 hours.  Glucose, capillary     Status: Abnormal   Collection Time: 07/29/21  3:32 AM  Result Value Ref Range   Glucose-Capillary 222 (H) 70 - 99 mg/dL    Comment: Glucose reference range applies only to samples taken after fasting for at least 8 hours.  CBC     Status: Abnormal   Collection Time: 07/29/21  3:33 AM  Result Value Ref Range   WBC 8.4 4.0 - 10.5 K/uL   RBC 3.62 (L) 3.87 - 5.11 MIL/uL   Hemoglobin 9.6 (L) 12.0 - 15.0 g/dL   HCT 30.9 (L) 36.0 - 46.0 %   MCV 85.4 80.0 - 100.0 fL   MCH 26.5 26.0 - 34.0 pg   MCHC 31.1 30.0 - 36.0 g/dL   RDW 20.1 (H) 11.5 - 15.5 %   Platelets 259 150 - 400 K/uL   nRBC 3.1 (H) 0.0 - 0.2 %    Comment: Performed at [facility],  [place] [address], [place], [place] [address]  Comprehensive metabolic panel     Status: Abnormal   Collection Time: 07/29/21  3:33 AM  Result Value Ref Range   Sodium 139 135 - 145 mmol/L   Potassium 3.8 3.5 - 5.1 mmol/L   Chloride 109 98 - 111 mmol/L   CO2 24 22 - 32 mmol/L   Glucose, Bld 264 (H) 70 - 99 mg/dL    Comment: Glucose reference range applies only to samples taken after fasting for at least 8 hours.   BUN 28 (H) 6 - 20 mg/dL   Creatinine, Ser 0.34 (L) 0.44 - 1.00 mg/dL   Calcium 8.0 (L) 8.9 - 10.3 mg/dL   Total Protein 5.7 (L) 6.5 - 8.1 g/dL   Albumin 2.1 (L) 3.5 - 5.0 g/dL   AST 13 (L) 15 - 41 U/L   ALT 11 0 - 44 U/L   Alkaline Phosphatase 55 38 - 126 U/L   Total Bilirubin 0.6 0.3 - 1.2 mg/dL   GFR, Estimated >60 >60 mL/min    Comment: (NOTE) Calculated using the CKD-EPI  Creatinine Equation (2021)    Anion gap 6 5 - 15    Comment: Performed at [facility], [place] [address], [place], [place] [address]  Magnesium     Status: None   Collection Time: 07/29/21  3:33 AM  Result Value Ref Range   Magnesium 2.0 1.7 - 2.4 mg/dL    Comment: Performed at [facility], [place] [address], [place], [place] [address]  Phosphorus     Status: Abnormal   Collection Time: 07/29/21  3:33 AM  Result Value Ref Range   Phosphorus 2.2 (L) 2.5 - 4.6 mg/dL    Comment: Performed at [facility], [place] [address] [place], [place] [address]  Glucose, capillary     Status: Abnormal   Collection Time: 07/29/21  8:14 AM  Result Value Ref Range   Glucose-Capillary 328 (H) 70 - 99 mg/dL    Comment: Glucose reference range applies only to samples taken after fasting for at least 8 hours.  Glucose, capillary     Status: Abnormal   Collection Time: 07/29/21 11:29 AM  Result Value Ref Range   Glucose-Capillary 230 (H) 70 - 99 mg/dL    Comment: Glucose reference range applies only to samples taken after fasting for at least 8 hours.  Glucose, capillary     Status: Abnormal   Collection Time: 07/29/21  3:39 PM  Result Value Ref Range   Glucose-Capillary 264 (H) 70 - 99 mg/dL    Comment: Glucose reference range applies only to samples taken after fasting for at least 8 hours.   Comment 1 Notify RN    Comment 2 Document in Chart   Glucose, capillary     Status: Abnormal   Collection Time: 07/29/21  7:50 PM  Result Value Ref Range   Glucose-Capillary 197 (H) 70 - 99 mg/dL    Comment: Glucose reference range applies only to samples taken after fasting for at least 8 hours.   Comment 1 Notify RN    Comment 2 Document in Chart   Glucose, capillary     Status: Abnormal   Collection Time: 07/29/21 11:22 PM  Result Value Ref Range   Glucose-Capillary 201 (H) 70 - 99 mg/dL    Comment: Glucose reference range applies only to samples taken  after fasting for at least 8 hours.   Comment 1 Notify RN  Comment 2 Document in Chart   Glucose, capillary     Status: Abnormal   Collection Time: 07/30/21  3:30 AM  Result Value Ref Range   Glucose-Capillary 199 (H) 70 - 99 mg/dL    Comment: Glucose reference range applies only to samples taken after fasting for at least 8 hours.   Comment 1 Notify RN    Comment 2 Document in Chart   CBC with Differential/Platelet     Status: Abnormal   Collection Time: 07/30/21  3:54 AM  Result Value Ref Range   WBC 7.0 4.0 - 10.5 K/uL   RBC 3.58 (L) 3.87 - 5.11 MIL/uL   Hemoglobin 9.3 (L) 12.0 - 15.0 g/dL   HCT 30.7 (L) 36.0 - 46.0 %   MCV 85.8 80.0 - 100.0 fL   MCH 26.0 26.0 - 34.0 pg   MCHC 30.3 30.0 - 36.0 g/dL   RDW 20.3 (H) 11.5 - 15.5 %   Platelets 235 150 - 400 K/uL   nRBC 3.1 (H) 0.0 - 0.2 %   Neutrophils Relative % 65 %   Neutro Abs 4.6 1.7 - 7.7 K/uL   Lymphocytes Relative 19 %   Lymphs Abs 1.3 0.7 - 4.0 K/uL   Monocytes Relative 5 %   Monocytes Absolute 0.3 0.1 - 1.0 K/uL   Eosinophils Relative 1 %   Eosinophils Absolute 0.1 0.0 - 0.5 K/uL   Basophils Relative 1 %   Basophils Absolute 0.1 0.0 - 0.1 K/uL   WBC Morphology MILD LEFT SHIFT (1-5% METAS, OCC MYELO, OCC BANDS)     Comment: VACUOLATED NEUTROPHILS   Immature Granulocytes 9 %   Abs Immature Granulocytes 0.65 (H) 0.00 - 0.07 K/uL   Polychromasia PRESENT     Comment: Performed at [facility], [place] [address] [place], [place] [address]  Prealbumin     Status: Abnormal   Collection Time: 07/30/21  3:54 AM  Result Value Ref Range   Prealbumin 7.1 (L) 18 - 38 mg/dL    Comment: Performed at [place] [address], [place], [place] [address]  Comprehensive metabolic panel     Status: Abnormal   Collection Time: 07/30/21  3:54 AM  Result Value Ref Range   Sodium 139 135 - 145 mmol/L   Potassium 3.3 (L) 3.5 - 5.1 mmol/L   Chloride 109 98 - 111 mmol/L   CO2 23 22 - 32 mmol/L   Glucose,  Bld 231 (H) 70 - 99 mg/dL    Comment: Glucose reference range applies only to samples taken after fasting for at least 8 hours.   BUN 36 (H) 6 - 20 mg/dL   Creatinine, Ser <0.30 (L) 0.44 - 1.00 mg/dL   Calcium 7.9 (L) 8.9 - 10.3 mg/dL   Total Protein 5.8 (L) 6.5 - 8.1 g/dL   Albumin 2.1 (L) 3.5 - 5.0 g/dL   AST 13 (L) 15 - 41 U/L   ALT 11 0 - 44 U/L   Alkaline Phosphatase 53 38 - 126 U/L   Total Bilirubin 0.5 0.3 - 1.2 mg/dL   GFR, Estimated NOT CALCULATED >60 mL/min    Comment: (NOTE) Calculated using the CKD-EPI Creatinine Equation (2021)    Anion gap 7 5 - 15    Comment: Performed at [facility], [place] [address], [place], [place] [address]  Magnesium     Status: None   Collection Time: 07/30/21  3:54 AM  Result Value Ref Range   Magnesium 1.9  1.7 - 2.4 mg/dL    Comment: Performed at [facility], [place] [address], [place], [place] [address]  Phosphorus     Status: Abnormal   Collection Time: 07/30/21  3:54 AM  Result Value Ref Range   Phosphorus 2.3 (L) 2.5 - 4.6 mg/dL    Comment: Performed at [facility], [place] [address], [place], [place] [address]  Glucose, capillary     Status: Abnormal   Collection Time: 07/30/21  8:25 AM  Result Value Ref Range   Glucose-Capillary 217 (H) 70 - 99 mg/dL    Comment: Glucose reference range applies only to samples taken after fasting for at least 8 hours.    Studies/Results: DG Abd 1 View  Result Date: 07/30/2021 CLINICAL DATA:  Follow-up ileus EXAM: ABDOMEN - 1 VIEW COMPARISON:  Abdominal x-ray 07/29/2021 FINDINGS: Enteric tube tip is in the stomach. Persistent distended loops of bowel across the mid abdomen which measure up to approximately 9.2 cm in diameter, mildly improved since previous study. No suspicious calcifications visualized. IMPRESSION: Persistent but mildly improved distended loops of bowel. Electronically Signed   By: Ofilia Neas M.D.   On: 07/30/2021 09:11   DG  Abd 1 View  Result Date: 07/29/2021 CLINICAL DATA:  Follow-up ileus EXAM: ABDOMEN - 1 VIEW COMPARISON:  Multiple prior radiographs including most recent dated July 27, 2021 from FINDINGS: NG tube with distal tip overlying the stomach. Gaseous dilated colonic loop in the right abdomen measuring up to 11 cm, unchanged. There also dilated bowel lobes in the mid abdomen and left lower quadrant. IMPRESSION: 1. No significant interval change and multiple distended bowel loops concerning for ileus/obstruction. 2. NG tube with side port and distal tip overlying the gastric body. Electronically Signed   By: Keane Police D.O.   On: 07/29/2021 09:12    Medications: I have reviewed the patient's current medications.  Assessment: Colonic ileus-on narcotics, bedbound, peritoneal carcinomatosis, low potassium 3.3 Metastatic pancreatic adenocarcinoma  Plan: Very poor prognosis Not a candidate for colonoscopy and decompression and anesthesia Not a candidate for neostigmine-peritoneal carcinomatosis, risk of arrhythmia, bradycardia, cardiac arrest, respiratory arrest, respiratory depression  Discussed the same with the patient's daughter at bedside. From GI standpoint recommend keeping potassium at least above 4, magnesium above 2, limit use of narcotics, out of bed to chair if possible. GI will sign off, please recall if needed.  Ronnette Juniper, MD 07/30/2021, 9:26 AM

## 2021-07-30 NOTE — Progress Notes (Addendum)
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  She has undergone extensive treatment since May 2021.  Her cancer seems to have progressed since then.    Events in the last 7 days-patient continues to be tachycardic with a resting heart rate in the 130s to 140s with severe deconditioning and increasing in heart rate with any activity.  Her nausea and vomiting had subsided initially however she had not had any bowel movement so she was given MiraLAX Dulcolax and lactulose for 1 day.  She started having diarrhea and had large bowel movements and a rectal tube was placed at that point.  All the laxatives were stopped.  She received laxatives only 1 day.  Then she started to have intractable nausea vomiting NG tube was placed and she was moved to stepdown unit.  Stat CT of the abdomen and pelvis without contrast(she has severe contrast allergy) possible ischemic bowel and colonic ileus.  PCCM and general surgery was consulted. When she was moved to stepdown unit her respiratory rate was in the 40s and her heart rate was in the 150s. Dr. Barry Dienes saw the patient she did not think patient has ischemic bowel and even if she did have ischemic bowel she was not a candidate for surgery due to peritoneal carcinomatosis.  Her lactic acid was normal. PCCM followed the patient over the weekend and signed off on 07/25/2021. Patient also developed increasing right pleural effusion and increasing ascites. She received albumin infusion for 6 doses. Xarelto was stopped and heparin was started as there was some blood-tinged urine as well as NG tube  drainage.  And she was n.p.o. due to colonic ileus and nausea vomiting with NG tube in place. Her medications were narrowed down on 07/23/2021 to include only the ones she really needs it and was able to be converted to IV.   07/26/2021 2.9 L of fluid taken out paracentesis 07/27/2021 600 cc of hazy amber-colored right thoracentesis fluid sent for studies Patient started on TPN Heparin stopped Lovenox started Patient pulled out NG tube on 07/26/2021 this was replaced on 07/27/2021 as she again started with bilious vomiting   Daughter involved very involved with patient's care and patient remains full code in spite of multiple consultants PCCM oncology palliative care General surgery and hospitalist team explaining to her that her prognosis is extremely poor prognosis.   Assessment & Plan:   Principal Problem:   Acute respiratory failure with hypoxia (HCC) Active Problems:   Type 2 diabetes mellitus (HCC)   Stage IV adenocarcinoma of pancreas (HCC)   Essential hypertension, benign   Abdominal ascites   Pleural effusion on right   Hyponatremia   Internal jugular (IJ) vein thromboembolism, chronic, right (HCC)   Goals of care, counseling/discussion   Intravascular volume depletion   Malnutrition of moderate degree   Ileus (HCC)   Hypokalemia   Hypophosphatemia   Hypomagnesemia  #1 acute hypoxemic respiratory failure secondary to large right pleural effusion status postthoracentesis 07/27/2021 -Improved.  Also in part secondary to poor diaphragmatic excursion with massive abdominal distention. -NG tube placed, status post thoracenteses. -Currently with sats of 100% on room air.  2.  Stage IV adenocarcinoma of the pancreas diagnosed 2021 with mets to the peritoneum, liver. -Patient undergone multiple rounds of chemotherapy and radiation. -Patient with progression of disease. -Patient being followed by oncology Dr. Marin Olp and patient deemed not a candidate for any chemotherapy at this  time. -CT abdomen 07/24/2021-large right effusion substantial ascites extensive peritoneal metastatic disease large cystic pelvic masses pancreatic tail tumor and heterogenicity in the liver suspicious for hepatic metastatic lesions atelectasis in the left lower lobe right middle lobe and right lower lobe stable small pericardial effusion. -Patient status post paracentesis and thoracentesis. -Patient noted to have pulled out NG tube 07/26/2021 and KUB done with worsening colonic ileus and as such NG tube placed back in as patient did have ongoing nausea and emesis. -No significant change with colonic ileus. -Patient with very poor prognosis.   -Palliative care consulted however due to religious believes patient's daughter believes patient is going to be healed and recommended ongoing aggressive treatment at this time.  3.  Malignant ascites -Status post paracentesis 07/26/2021 with cytology consistent with malignant cells consistent with metastatic adenocarcinoma. -2.9 L of fluid removed. -Oncology following  4.  Sinus tachycardia -Likely multifocal secondary to acute illnesses including progressive stage IV adenocarcinoma of the pancreas, colonic ileus, pleural effusion. -2D echo done with normal EF. -Increase Lopressor to 7.5 mg IV every 6 hours.   -Follow.   5.  Right IJ thrombus -Full dose Lovenox.   6.  Fever -Likely tumor fever. -Status post course of cefepime and Flagyl. -No further antibiotics needed.  7.  Colonic ileus -Likely secondary to stage IV adenocarcinoma of the pancreas with peritoneal mets in the setting of opioid pain medication. -Patient noted to have pulled out NG tube 07/26/2021 with KUB showing worsening colonic ileus. -NG tube placed back in 07/27/2021 with a output of 200 cc over the past 24 hours. -Per NG tube with no recorded output noted over the past 24 hours however bilious drainage noted in canister.  -Rectal tube discontinued. -Patient very weak making  mobility difficult.   -Repeat KUB done this morning with persistent but mildly improved distended loops of bowel noted.   -Patient with hypoactive bowel sounds . -Continue NG tube, PT OT, mobilize as able to.   -Patient seen in consultation by GI who feel patient is not a candidate for colonoscopy and decompression and anesthesia, not a candidate for neostigmine -peritoneal carcinomatosis, risk of arrhythmia, bradycardia, cardiac arrest, respiratory arrest, respiratory depression.  Per GI patient with very poor prognosis.   -Minimize opioids/narcotics. -Goal to keep potassium approximately at 4, magnesium approximately at 2. -Continue current supportive care.   8.  Hypokalemia/hypophosphatemia/hypomagnesemia -Patient on TPN, being repleted per pharmacy. -Potassium at 3.3 today, phosphorus at 2.3, magnesium at 1.9. -KCl 10 mEq IV every hour x4 runs. -Goal is to keep potassium approximately 4, magnesium at 2.  9.  Moderate protein calorie malnutrition -On TPN. -Thiamine added to TPN per RD recommendations due to concern for refeeding syndrome.  10.  Hyperglycemia -Likely secondary to TPN. -CBG 199 this morning. -Insulin being adjusted in TPN per pharmacy. -Patient received a dose of Semglee 10 units x 1 on 07/29/2021. -Per pharmacy.   DVT prophylaxis: Lovenox Code Status: Full Family Communication: Updated daughter at bedside. Disposition:   Status is: Inpatient  Remains inpatient appropriate because: Current ongoing inpatient care.  Patient with ileus versus small bowel obstruction currently with NG tube placement, currently on TPN, not stable for discharge.  Consultants:  Palliative care: Dr. Rowe Pavy 07/16/2021 PCCM: Dr.Meier 07/24/2021 General surgery: Dr. Barry Dienes 07/24/2021 Gastroenterology: Dr. Therisa Doyne 07/29/2021  Procedures:  Ultrasound-guided thoracentesis 07/27/2021 per Dr. Serafina Royals, IR Upper extremity Dopplers 07/20/2021 Lower extremity Dopplers 07/07/2021 2D echo  07/23/2021 Ultrasound-guided paracentesis: Dr. Laurence Ferrari IR 07/17/2021--3.4 L of yellow fluid removed Ultrasound-guided paracentesis 07/26/2021 2.9 L of hazy fluid removed per Dr.Mugweru, IR CT abdomen and pelvis 06/26/2021, 07/24/2021 KUB 07/29/2021  Antimicrobials:  IV cefepime 07/24/2021>>>> 07/27/2021 IV Flagyl 07/24/2021>>>>> 07/27/2021   Subjective: Patient laying in bed, slightly more alert today than she was yesterday.  Asking for some water.  Daughter at bedside.  Rectal tube has been removed.  NG tube with bilious drainage.  Daughter asking whether oncology may consider ordering tumor marker and also asking whether patient could receive some albumin.  Objective: Vitals:   07/30/21 0401 07/30/21 0600 07/30/21 0700 07/30/21 0800  BP:      Pulse:  (!) 106 (!) 114   Resp:  (!) 28 (!) 22   Temp: 97.8 F (36.6 C)   98.2 F (36.8 C)  TempSrc: Axillary   Oral  SpO2:  97% 99%   Weight:      Height:        Intake/Output Summary (Last 24 hours) at 07/30/2021 0954 Last data filed at 07/30/2021 0556 Gross per 24 hour  Intake 1477.85 ml  Output 350 ml  Net 1127.85 ml    Filed Weights   07/26/21 0500 07/27/21 0500 07/28/21 0500  Weight: 93.8 kg 94.1 kg 94.1 kg    Examination:  General exam: NG tube in place, bilious drainage.  More alert today. Respiratory system: Lungs clear to auscultation bilaterally anterior lung fields.  No wheezes, no crackles, no rhonchi.  Normal respiratory effort. Cardiovascular system: Tachycardia.  No murmurs rubs or gallops.  1-2+ bilateral lower extremity edema.  Gastrointestinal system: Abdomen distended, mildly soft, nontender to palpation, hypoactive bowel sounds.  No rebound.  No guarding.  Central nervous system: Slightly more alert today.  Moving extremities spontaneously.  No focal neurological deficits.  Extremities: Symmetric 5 x 5 power. Skin: No rashes, lesions or ulcers Psychiatry: Judgement and insight unable to assess.. Mood & affect  appropriate.     Data Reviewed: I have personally reviewed following labs and imaging studies  CBC: Recent Labs  Lab 07/26/21 0500 07/27/21 0431 07/28/21 0637 07/29/21 0333 07/30/21 0354  WBC 2.6* 4.6 6.3 8.4 7.0  NEUTROABS  --   --   --   --  4.6  HGB 8.6* 8.8* 9.0* 9.6* 9.3*  HCT 27.7* 28.1* 29.1* 30.9* 30.7*  MCV 85.2 84.9 85.6 85.4 85.8  PLT 275 276 288 259 235     Basic Metabolic Panel: Recent Labs  Lab 07/26/21 0500 07/27/21 0431 07/28/21 0637 07/29/21 0333 07/30/21 0354  NA 136 137 139 139 139  K 3.3* 3.3* 3.8 3.8 3.3*  CL 100 107 109 109 109  CO2 20* 20* 24 24 23   GLUCOSE 245* 242* 289* 264* 231*  BUN 38* 27* 23* 28* 36*  CREATININE 0.47 0.44 <0.30* 0.34* <0.30*  CALCIUM 8.3* 7.8* 7.7* 8.0* 7.9*  MG 2.2 2.2 2.1 2.0 1.9  PHOS  --  1.1* 1.7* 2.2* 2.3*     GFR: CrCl cannot be calculated (This lab value cannot be used to calculate CrCl because it is not a number: <0.30).  Liver Function Tests: Recent Labs  Lab 07/26/21 0500 07/27/21 0431 07/28/21 0637 07/29/21 0333 07/30/21 0354  AST 13* 13* 11* 13*  13*  ALT 16 13 12 11 11   ALKPHOS 54 53 52 55 53  BILITOT 1.5* 1.4* 0.7 0.6 0.5  PROT 5.9* 5.4* 5.4* 5.7* 5.8*  ALBUMIN 2.9* 2.4* 2.2* 2.1* 2.1*     CBG: Recent Labs  Lab 07/29/21 1539 07/29/21 1950 07/29/21 2322 07/30/21 0330 07/30/21 0825  GLUCAP 264* 197* 201* 199* 217*      Recent Results (from the past 240 hour(s))  Urine Culture     Status: Abnormal   Collection Time: 07/20/21 12:29 PM   Specimen: Urine, Catheterized  Result Value Ref Range Status   Specimen Description   Final    URINE, CATHETERIZED Performed at [facility], [place] [address] [place], [place] [address]    Special Requests   Final    Immunocompromised Performed at [facility] [place] [address] [place], [place] [address]    Culture (A)  Final    <10,000 COLONIES/mL INSIGNIFICANT GROWTH Performed at [place] [address] [place], [place] [address]    Report Status 07/21/2021 FINAL  Final  MRSA Next Gen by PCR, Nasal     Status: Abnormal   Collection Time: 07/24/21  5:58 PM   Specimen: Nasal Mucosa; Nasal Swab  Result Value Ref Range Status   MRSA by PCR Next Gen DETECTED (A) NOT DETECTED Final    Comment: RESULT CALLED TO, READ BACK BY AND VERIFIED WITH: CALDERON,E RN @2141  ON 07/24/21 JACKSON,K (NOTE) The GeneXpert MRSA Assay (FDA approved for NASAL specimens only), is one component of a comprehensive MRSA colonization surveillance program. It is not intended to diagnose MRSA infection nor to guide or monitor treatment for MRSA infections. Test performance is not FDA approved in patients less than 57 years old. Performed at [facility], [place] [address], [place], [place] [address]   Culture, blood (routine x 2)     Status: None   Collection Time: 07/24/21  7:57 PM   Specimen: BLOOD  Result Value Ref Range Status   Specimen Description   Final    BLOOD BLOOD LEFT HAND Performed at [place] [address] [place], [place] [address]    Special Requests   Final    BOTTLES DRAWN AEROBIC ONLY Blood Culture adequate volume Performed at [place] [address] [place], [place] [address]    Culture   Final    NO GROWTH 5 DAYS Performed at [facility], [place] [address], [place], [place] [address]    Report Status 07/29/2021 FINAL  Final  Culture, blood (routine x 2)     Status: None   Collection Time: 07/24/21  7:57 PM   Specimen: BLOOD  Result Value Ref Range Status   Specimen Description   Final    BLOOD LEFT WRIST Performed at [place] [address], [place], [place] [address]    Special Requests   Final    BOTTLES DRAWN AEROBIC ONLY Blood Culture adequate volume Performed at [place] [address] [place], [place] [address]    Culture   Final    NO  GROWTH 5 DAYS Performed at [facility], [place] [address], [place],  [address]    Report Status 07/29/2021 FINAL  Final  Urine Culture     Status: Abnormal   Collection Time: 07/25/21  1:36 AM   Specimen: In/Out Cath Urine  Result Value Ref Range Status   Specimen  Description   Final    IN/OUT CATH URINE Performed at [place] [address] [place], [place] [address]    Special Requests   Final    NONE Performed at [facility], [place] [address], [place], [place] [address]    Culture MULTIPLE SPECIES PRESENT, SUGGEST RECOLLECTION (A)  Final   Report Status 07/26/2021 FINAL  Final  Culture, body fluid w Gram Stain-bottle     Status: None (Preliminary result)   Collection Time: 07/26/21  2:29 PM   Specimen: Fluid  Result Value Ref Range Status   Specimen Description FLUID PERITONEAL  Final   Special Requests BOTTLES DRAWN AEROBIC AND ANAEROBIC  Final   Culture   Final    NO GROWTH 4 DAYS Performed at [facility], [place] [address] [place], [place] [address]    Report Status PENDING  Incomplete  Gram stain     Status: None   Collection Time: 07/26/21  2:29 PM   Specimen: Fluid  Result Value Ref Range Status   Specimen Description FLUID PERITONEAL  Final   Special Requests NONE  Final   Gram Stain   Final    MODERATE WBC SEEN NO ORGANISMS SEEN Performed at [facility], [address] [place], [place] [address]    Report Status 07/28/2021 FINAL  Final  Culture, body fluid w Gram Stain-bottle     Status: None (Preliminary result)   Collection Time: 07/27/21  1:15 PM   Specimen: Fluid  Result Value Ref Range Status   Specimen Description FLUID PLEURAL  Final   Special Requests   Final    BOTTLES DRAWN AEROBIC AND ANAEROBIC Blood Culture adequate volume   Culture   Final    NO GROWTH 3 DAYS Performed at [facility], [place] [address] [place], [place] [address]    Report Status PENDING  Incomplete  Gram  stain     Status: None   Collection Time: 07/27/21  1:15 PM   Specimen: Fluid  Result Value Ref Range Status   Specimen Description FLUID PLEURAL  Final   Special Requests   Final    BOTTLES DRAWN AEROBIC AND ANAEROBIC Blood Culture adequate volume   Gram Stain   Final    NO SQUAMOUS EPITHELIAL CELLS SEEN NO WBC SEEN NO ORGANISMS SEEN Performed at [facility], [address] [place],  [address]    Report Status 07/29/2021 FINAL  Final          Radiology Studies: DG Abd 1 View  Result Date: 07/30/2021 CLINICAL DATA:  Follow-up ileus EXAM: ABDOMEN - 1 VIEW COMPARISON:  Abdominal x-ray 07/29/2021 FINDINGS: Enteric tube tip is in the stomach. Persistent distended loops of bowel across the mid abdomen which measure up to approximately 9.2 cm in diameter, mildly improved since previous study. No suspicious calcifications visualized. IMPRESSION: Persistent but mildly improved distended loops of bowel. Electronically Signed   By: Ofilia Neas M.D.   On: 07/30/2021 09:11   DG Abd 1 View  Result Date: 07/29/2021 CLINICAL DATA:  Follow-up ileus EXAM: ABDOMEN - 1 VIEW COMPARISON:  Multiple prior radiographs including most recent dated July 27, 2021 from FINDINGS: NG tube with distal tip overlying the stomach. Gaseous dilated colonic loop in the right abdomen measuring up to 11 cm, unchanged. There also dilated bowel lobes in the mid abdomen and left lower quadrant. IMPRESSION: 1. No significant interval change and multiple distended bowel loops concerning for  ileus/obstruction. 2. NG tube with side port and distal tip overlying the gastric body. Electronically Signed   By: Keane Police D.O.   On: 07/29/2021 09:12        Scheduled Meds:  chlorhexidine  15 mL Mouth Rinse BID   Chlorhexidine Gluconate Cloth  6 each Topical Daily   enoxaparin (LOVENOX) injection  150 mg Subcutaneous Daily   fentaNYL  1 patch Transdermal Q72H   insulin aspart  0-20 Units Subcutaneous  Q4H   mouth rinse  15 mL Mouth Rinse q12n4p   metoprolol tartrate  5 mg Intravenous Q6H   pantoprazole (PROTONIX) IV  40 mg Intravenous Q12H   scopolamine  1 patch Transdermal Q72H   sodium chloride flush  3 mL Intravenous Q12H   Continuous Infusions:  sodium chloride Stopped (07/27/21 1715)   promethazine (PHENERGAN) injection (IM or IVPB) 12.5 mg (07/24/21 1511)   TPN ADULT (ION) 60 mL/hr at 07/30/21 0526     LOS: 15 days    Time spent: 40 minutes    Irine Seal, MD [facility]   To contact the attending provider between 7A-7P or the covering provider during after hours 7P-7A, please log into the web site [email or web address] and access using universal [place] password for that web site. If you do not have the password, please call the hospital operator.  07/30/2021, 9:54 AM

## 2021-07-30 NOTE — Progress Notes (Signed)
Overall, there really is no improvement in Ms. Rother.  In fact, she just looks "different" this morning.  Her breathing is a little bit different.  She just has that "look" of being tired and not being able to go much longer.  She had her abdominal film yesterday.  This is no better.  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  I cannot hear any bowel sounds when I listen to her abdomen.  She is on the TNA.  Again I am not sure this is really helping but we are trying to accommodate the patient's religious believes.  Her labs today show white cell count of 7.  Hemoglobin 9.3.  Platelet count 235,000.  The prealbumin is 7.1.  Again I am not surprised by this.  Her BUN is 36 creatinine less than 0.3.  Blood sugar 231.  Calcium is 7.9 with an albumin of 2.1.  On her physical exam, her vital signs are temperature 97.8.  Pulse 114.  Blood pressure 152/75.  Her lungs are clear.  Cardiac exam tachycardic but regular.  Abdomen is distended.  Bowel sounds are not present.  There is no guarding.  Extremities shows some edema in the legs.  Again, I do still see that [name] is going to make much progress.  I do still think she has anything left to improve.  I think her prealbumin is highly significant.  Again, I do just do not think that we are going to be able to stop the TNA.  I realize this is a very delicate situation.  We are trying her best to honor her religious wishes.  I know that the ICU staff are doing a tremendous job with her.  Lattie Haw, MD  [address]  2:3-4

## 2021-07-30 NOTE — Progress Notes (Signed)
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  She has undergone extensive treatment since May 2021.  Her cancer seems to have progressed since then.    PT Comments    Pt in bed verbally unresponsive and lethargic 99% with only brief moment of eye opening with response to name.  Daughter at bedside and very attentive and supported.  Had daughter communicate to pt as we rolled her and lifted her OOB.  Pt did not speak or show any active movements other than a slight head roll.  Positioned in recliner with multiple pillows.  HR at rest was 126 and during activity stayed below 130's.     Recommendations for follow up therapy are one component of a multi-disciplinary discharge planning process, led by the attending physician.  Recommendations may be updated based on patient status, additional functional criteria and insurance authorization.  Follow Up Recommendations  Skilled nursing-short term rehab (<3 hours/day)     Assistance Recommended at Discharge Frequent or constant Supervision/Assistance  Equipment Recommendations  Rolling walker (2 wheels);Wheelchair (measurements PT)    Recommendations for Other Services       Precautions / Restrictions Precautions Precautions: Fall Precaution Comments: NG Tube     Mobility  Bed Mobility Overal bed mobility: Needs Assistance Bed Mobility: Rolling Rolling: Total assist;+2 for physical assistance;+2 for safety/equipment         General bed mobility comments: Total Assist side to side rolling to place Shy Maxi Pad under as well as Pad change due to urine incont    Transfers Overall transfer level: Needs assistance                  General transfer comment: Used [employer] to assist from bed to recliner    Ambulation/Gait                   Stairs             Wheelchair Mobility    Modified Rankin (Stroke Patients Only)       Balance                                            Cognition Arousal/Alertness: Lethargic                                     General Comments: pt was 99% lethargic, non verbal and eyes closed with <5% brief opening with responce to name.        Exercises      General Comments        Pertinent Vitals/Pain Pain Assessment: No/denies pain    Home Living                          Prior Function            PT Goals (current goals can now be found in the care plan section) Progress towards PT goals: Progressing toward goals    Frequency    Min 2X/week      PT  Plan Current plan remains appropriate    Co-evaluation              AM-PAC PT "6 Clicks" Mobility   Outcome Measure    Help needed moving from lying on your back to sitting on the side of a flat bed without using bedrails?: Total Help needed moving to and from a bed to a chair (including a wheelchair)?: Total Help needed standing up from a chair using your arms (e.g., wheelchair or bedside chair)?: Total Help needed to walk in hospital room?: Total Help needed climbing 3-5 steps with a railing? : Total 6 Click Score: 5    End of Session Equipment Utilized During Treatment: Gait belt Activity Tolerance: Patient limited by lethargy Patient left: in chair;with call bell/phone within reach Nurse Communication: Mobility status;Need for lift equipment PT Visit Diagnosis: Other abnormalities of gait and mobility (R26.89);Difficulty in walking, not elsewhere classified (R26.2)     Time: 1202-1230 PT Time Calculation (min) (ACUTE ONLY): 28 min  Charges:  $Therapeutic Activity: 23-37 mins                      {Destiny Trickey  PTA Acute  Rehabilitation Services Pager      [phone] Office      [phone]

## 2021-07-30 NOTE — Progress Notes (Signed)
Daily Progress Note   Patient Name: Angela Wilson       Date: 07/30/2021 DOB: 02-Sep-1966  Age: 55 y.o. MRN#: [id number] Attending Physician: Eugenie Filler, MD Primary Care Physician: Just, Laurita Quint, FNP (Inactive) Admit Date: 07/09/2021  Reason for Consultation/Follow-up: Establishing goals of care  Subjective: Patient is more awake and alert this afternoon, she is sitting up in a chair, daughter at bedside.  Patient's son is  due to arrive today.     Length of Stay: 15  Current Medications: Scheduled Meds:   chlorhexidine  15 mL Mouth Rinse BID   Chlorhexidine Gluconate Cloth  6 each Topical Daily   enoxaparin (LOVENOX) injection  150 mg Subcutaneous Daily   fentaNYL  1 patch Transdermal Q72H   insulin aspart  0-20 Units Subcutaneous Q4H   mouth rinse  15 mL Mouth Rinse q12n4p   metoprolol tartrate  5 mg Intravenous Q6H   pantoprazole (PROTONIX) IV  40 mg Intravenous Q12H   scopolamine  1 patch Transdermal Q72H   sodium chloride flush  3 mL Intravenous Q12H    Continuous Infusions:  sodium chloride Stopped (07/27/21 1715)   sodium chloride 100 mL/hr at 07/30/21 1057   potassium chloride Stopped (07/30/21 1224)   potassium PHOSPHATE IVPB (in mmol)     promethazine (PHENERGAN) injection (IM or IVPB) 12.5 mg (07/24/21 1511)   TPN ADULT (ION) 60 mL/hr at 07/30/21 0526   TPN ADULT (ION)      PRN Meds: sodium chloride, acetaminophen **OR** acetaminophen, HYDROmorphone (DILAUDID) injection, lip balm, LORazepam, metoprolol tartrate, ondansetron **OR** ondansetron (ZOFRAN) IV, phenol, prochlorperazine, promethazine (PHENERGAN) injection (IM or IVPB), sodium chloride flush  Physical Exam         Appears chronically ill fatigued Regular work of breathing Has O2 [place] Abdomen  distended Has edema  has NG tube Vital Signs: BP (!) 152/75 (BP Location: Left Arm)   Pulse (!) 114   Temp 99.7 F (37.6 C) (Oral)   Resp (!) 22   Ht 5\' 6"  (1.676 m)   Wt 94.1 kg   SpO2 99%   BMI 33.48 kg/m  SpO2: SpO2: 99 % O2 Device: O2 Device: Room Air O2 Flow Rate: O2 Flow Rate (L/min): 0 L/min  Intake/output summary:  Intake/Output Summary (Last 24 hours) at 07/30/2021 1339 Last data filed at  07/30/2021 0556 Gross per 24 hour  Intake 1477.85 ml  Output 350 ml  Net 1127.85 ml    LBM: Last BM Date: 07/28/21 Baseline Weight: Weight: 100.7 kg Most recent weight: Weight: 94.1 kg PPS 30%      Palliative Assessment/Data:      Patient Active Problem List   Diagnosis Date Noted   Malnutrition of moderate degree 07/28/2021   Ileus (HCC)    Hypokalemia    Hypophosphatemia    Hypomagnesemia    Intravascular volume depletion 07/18/2021   Internal jugular (IJ) vein thromboembolism, chronic, right ([place]) 07/16/2021   Goals of care, counseling/discussion 07/16/2021   Acute respiratory failure with hypoxia ([place]) 07/03/2021   Abdominal ascites 06/25/2021   Pleural effusion on right 06/22/2021   Hyponatremia 06/21/2021   Genetic testing 05/12/2020   Family history of breast cancer    Family history of ovarian cancer    Family history of kidney cancer    Family history of brain cancer    Essential hypertension, benign 04/06/2020   Pancreatic cancer ([place]) 02/14/2020   Cancer associated pain 02/13/2020   Stage IV adenocarcinoma of pancreas ([place]) 02/03/2020   Screening for colon cancer 01/07/2020   Encounter for screening mammogram for malignant neoplasm of breast 01/07/2020   Need for Tdap vaccination 01/07/2020   Pancreatic mass 12/30/2019   Nephrolithiasis 12/20/2012   GERD (gastroesophageal reflux disease) 12/20/2012   Type 2 diabetes mellitus ([place]) 11/09/2012    Palliative Care Assessment & Plan   Patient Profile:    Assessment:  55 yo lady with life  limiting illness of pancreatic cancer, ascites, abdominal pain, LE edema.   Recommendations/Plan: Continue current care.  Remains full code/full scope  Continue efforts to assist with documentation outlining her medical illness to support her son and daughter's applications for visitation visa.   Offered active listening and supportive care.  Patient's daughter wishes to continue current mode of care.  Discussed with TRH MD.  Goals of Care and Additional Recommendations: Limitations on Scope of Treatment: Full Scope Treatment  Code Status:    Code Status Orders  (From admission, onward)           Start     Ordered   06/20/2021 2022  Full code  Continuous        07/01/2021 2024           Code Status History     Date Active Date Inactive Code Status Order ID Comments User Context   02/14/2020 0213 02/18/2020 2347 Full Code [id number]  Clance Boll, MD Inpatient       Prognosis: Likely weeks at best, she continues to have decline overall and remains at high risk for acute decompensation.  Discharge Planning: To Be Determined although I fear this will be a terminal admission.  Care plan was discussed with IDT.    Thank you for allowing the Palliative Medicine Team to assist in the care of this patient.   Total Time 25 Prolonged Time Billed  no   Greater than 50%  of this time was spent counseling and coordinating care related to the above assessment and plan.  Loistine Chance, MD  Please contact Palliative Medicine Team phone at [phone] for questions and concerns from 7 AM-7 PM.  After 7 PM, please call primary service.

## 2021-07-30 NOTE — Progress Notes (Signed)
PHARMACY - TOTAL PARENTERAL NUTRITION CONSULT NOTE   Indication: Prolonged ileus  Patient Measurements: Height: 5\' 6"  (167.6 cm) Weight: 94.1 kg (207 lb 7.3 oz) IBW/kg (Calculated) : 59.3 TPN AdjBW (KG): 69.6 Body mass index is 33.48 kg/m. Usual Weight:   Assessment: 6 yoF admitted on 10/27 with N/V, abdominal pain.  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  She has had poor PO intake, NPO since 11/5 d/t colonic ileus.  NG tube removed by pt on 11/7, replaced on 11/8.  She is high risk for re-feeding.  GOC:  Due to religious beliefs, patient and family continue to want full scope of care without consideration of scaling back treatment in efforts to focus on comfort.  Glucose / Insulin: Hx of diabetes, not on any medications PTA.  Most recent A1c 8.6% (01/2020) - No recent steroids; dexamethasone 10mg  IV given 07/13/21 - CBGs elevated 197-264 - mSSI: 48 units/ 24 hrs - Insulin in TPN: increased to 30 units (11/10) - Glargine 10 units x1 per MD on 11/10  Electrolytes: K 3.3 (goal > 4), Mag 1.9 (goal >2), Phos 2.3.  Others WNL including CorrCa Renal: SCr low/stable.  BUN up to 36.  IVF per MD, adding NS @ 100 Hepatic: AST/ALT low/stable, Tbili WNL TG:  127 (11/9) Intake / Output; MIVF: UOP down to 250 ml; NG output 100 mL; Fecal tube removed GI Imaging:  11/5 CT: extensive peritoneal metastatic disease, large cystic pelvic masses, pancreatic tail tumor, and some heterogeneity in the liver suspicious for hepatic metastatic lesions.  Central access: Port TPN start date: 11/8  Nutritional Goals: Goal TPN rate is 90 mL/hr (provides 121 g of protein and 2160 kcals per day)  RD Assessment:  (11/8) Estimated Needs Total Energy Estimated Needs: 2100-2300 kcal Total Protein Estimated Needs: 110-125 grams Total Fluid Estimated Needs: >/= 1.6  L/day  Current Nutrition:  NPO  Plan:  Now:  Kphos 30 mMol (45 meq K) - administer in NS fluid instead of D5 KCl per MD - 40 mEq IV At 18:00 Continue TPN @ 60 mL/hr at 1800 will not advance today due to hyperglycemia  Electrolytes in TPN: Na 62mEq/L, K 40 mEq/L, Ca 20mEq/L, Mg 31mEq/L, and Phos 20 mmol/L. Cl:Ac 1:2 Standard MVI and trace elements in TPN Continue resistant SSI q4h and adjust as needed  Increase regular insulin in  TPN:  50 units Thiamine in TPN  IVF per MD (NS @ 100 ml/hr) Monitor TPN labs on Mon/Thurs. BMET, Mg & Phos in AM  Gretta Arab PharmD, [place] main pharmacy [phone] 07/30/2021 10:06 AM

## 2021-07-31 LAB — COMPREHENSIVE METABOLIC PANEL
ALT: 11 U/L (ref 0–44)
AST: 18 U/L (ref 15–41)
Albumin: 2 g/dL — ABNORMAL LOW (ref 3.5–5.0)
Alkaline Phosphatase: 52 U/L (ref 38–126)
Anion gap: 8 (ref 5–15)
BUN: 46 mg/dL — ABNORMAL HIGH (ref 6–20)
CO2: 23 mmol/L (ref 22–32)
Calcium: 7.9 mg/dL — ABNORMAL LOW (ref 8.9–10.3)
Chloride: 109 mmol/L (ref 98–111)
Creatinine, Ser: 0.37 mg/dL — ABNORMAL LOW (ref 0.44–1.00)
GFR, Estimated: >60 mL/min (ref >60)
Glucose, Bld: 122 mg/dL — ABNORMAL HIGH (ref 70–99)
Potassium: 3.6 mmol/L (ref 3.5–5.1)
Sodium: 140 mmol/L (ref 135–145)
Total Bilirubin: 0.6 mg/dL (ref 0.3–1.2)
Total Protein: 5.7 g/dL — ABNORMAL LOW (ref 6.5–8.1)

## 2021-07-31 LAB — CBC WITH DIFFERENTIAL/PLATELET
Abs Immature Granulocytes: 0.29 10*3/uL — ABNORMAL HIGH (ref 0.00–0.07)
Basophils Absolute: 0 10*3/uL (ref 0.0–0.1)
Basophils Relative: 1 %
Eosinophils Absolute: 0.1 10*3/uL (ref 0.0–0.5)
Eosinophils Relative: 2 %
HCT: 29.3 % — ABNORMAL LOW (ref 36.0–46.0)
Hemoglobin: 8.7 g/dL — ABNORMAL LOW (ref 12.0–15.0)
Immature Granulocytes: 5 %
Lymphocytes Relative: 17 %
Lymphs Abs: 1 10*3/uL (ref 0.7–4.0)
MCH: 26 pg (ref 26.0–34.0)
MCHC: 29.7 g/dL — ABNORMAL LOW (ref 30.0–36.0)
MCV: 87.7 fL (ref 80.0–100.0)
Monocytes Absolute: 0.3 10*3/uL (ref 0.1–1.0)
Monocytes Relative: 6 %
Neutro Abs: 4.1 10*3/uL (ref 1.7–7.7)
Neutrophils Relative %: 69 %
Platelets: 199 10*3/uL (ref 150–400)
RBC: 3.34 MIL/uL — ABNORMAL LOW (ref 3.87–5.11)
RDW: 20.9 % — ABNORMAL HIGH (ref 11.5–15.5)
Smear Review: NORMAL
WBC: 5.9 10*3/uL (ref 4.0–10.5)
nRBC: 3.1 % — ABNORMAL HIGH (ref 0.0–0.2)

## 2021-07-31 LAB — CULTURE, BODY FLUID W GRAM STAIN -BOTTLE: Culture: NO GROWTH

## 2021-07-31 LAB — GLUCOSE, CAPILLARY
Glucose-Capillary: 101 mg/dL — ABNORMAL HIGH (ref 70–99)
Glucose-Capillary: 101 mg/dL — ABNORMAL HIGH (ref 70–99)
Glucose-Capillary: 106 mg/dL — ABNORMAL HIGH (ref 70–99)
Glucose-Capillary: 114 mg/dL — ABNORMAL HIGH (ref 70–99)
Glucose-Capillary: 115 mg/dL — ABNORMAL HIGH (ref 70–99)
Glucose-Capillary: 123 mg/dL — ABNORMAL HIGH (ref 70–99)

## 2021-07-31 LAB — MAGNESIUM: Magnesium: 2 mg/dL (ref 1.7–2.4)

## 2021-07-31 LAB — PHOSPHORUS: Phosphorus: 3.6 mg/dL (ref 2.5–4.6)

## 2021-07-31 MED ORDER — FUROSEMIDE 10 MG/ML IJ SOLN
20.0000 mg | Freq: Once | INTRAMUSCULAR | Status: AC
  Administered 2021-07-31: 20 mg via INTRAVENOUS
  Filled 2021-07-31: qty 2

## 2021-07-31 MED ORDER — POTASSIUM CHLORIDE 10 MEQ/100ML IV SOLN
10.0000 meq | INTRAVENOUS | Status: AC
  Administered 2021-07-31 (×3): 10 meq via INTRAVENOUS
  Filled 2021-07-31 (×3): qty 100

## 2021-07-31 MED ORDER — POTASSIUM CHLORIDE 10 MEQ/50ML IV SOLN: 10.0000 meq | INTRAVENOUS | Status: DC

## 2021-07-31 MED ORDER — KETOROLAC TROMETHAMINE 30 MG/ML IJ SOLN
30.0000 mg | Freq: Four times a day (QID) | INTRAMUSCULAR | Status: DC | PRN
  Administered 2021-07-31 – 2021-08-03 (×8): 30 mg via INTRAVENOUS
  Filled 2021-07-31 (×9): qty 1

## 2021-07-31 MED ORDER — TRAVASOL 10 % IV SOLN
INTRAVENOUS | Status: AC
  Filled 2021-07-31: qty 1252.8

## 2021-07-31 MED ORDER — ALBUMIN HUMAN 25 % IV SOLN
25.0000 g | Freq: Four times a day (QID) | INTRAVENOUS | Status: AC
  Administered 2021-07-31 – 2021-08-01 (×4): 25 g via INTRAVENOUS
  Filled 2021-07-31 (×4): qty 100

## 2021-07-31 NOTE — Progress Notes (Signed)
Daily Progress Note   Patient Name: Angela Wilson       Date: 07/31/2021 DOB: December 25, 1965  Age: 55 y.o. MRN#: [id number] Attending Physician: Eugenie Filler, MD Primary Care Physician: Just, Laurita Quint, FNP (Inactive) Admit Date: 07/13/2021  Reason for Consultation/Follow-up: Establishing goals of care  Subjective: Patient is resting in bed, she met with her son last night [place], daughter at bedside. Patient appears with ongoing generalized weakness.       Length of Stay: 16  Current Medications: Scheduled Meds:   chlorhexidine  15 mL Mouth Rinse BID   Chlorhexidine Gluconate Cloth  6 each Topical Daily   enoxaparin (LOVENOX) injection  150 mg Subcutaneous Daily   fentaNYL  1 patch Transdermal Q72H   insulin aspart  0-20 Units Subcutaneous Q4H   mouth rinse  15 mL Mouth Rinse q12n4p   metoprolol tartrate  7.5 mg Intravenous Q6H   pantoprazole (PROTONIX) IV  40 mg Intravenous Q12H   scopolamine  1 patch Transdermal Q72H   sodium chloride flush  10-40 mL Intracatheter Q12H   sodium chloride flush  3 mL Intravenous Q12H    Continuous Infusions:  sodium chloride Stopped (07/27/21 1715)   sodium chloride 100 mL/hr at 07/31/21 1000   albumin human 60 mL/hr at 07/31/21 1000   potassium chloride Stopped (07/31/21 1132)   promethazine (PHENERGAN) injection (IM or IVPB) 12.5 mg (07/24/21 1511)   TPN ADULT (ION) 60 mL/hr at 07/31/21 1000   TPN ADULT (ION)      PRN Meds: sodium chloride, acetaminophen **OR** acetaminophen, HYDROmorphone (DILAUDID) injection, lip balm, LORazepam, metoprolol tartrate, ondansetron **OR** ondansetron (ZOFRAN) IV, phenol, prochlorperazine, promethazine (PHENERGAN) injection (IM or IVPB), sodium chloride flush, sodium  chloride flush  Physical Exam         Appears chronically ill fatigued Regular work of breathing Has O2 [place] Abdomen distended Has edema  has NG tube Vital Signs: BP (!) 146/79   Pulse (!) 116   Temp 98.2 F (36.8 C) (Axillary)   Resp (!) 21   Ht _0  (1.676 m)   Wt 92.4 kg   SpO2 100%   BMI 32.88 kg/m  SpO2: SpO2: 100 % O2 Device: O2 Device: Room Air O2 Flow Rate: O2 Flow Rate (L/min): 0 L/min  Intake/output summary:  Intake/Output Summary (Last 24 hours) at 07/31/2021 1143 Last data filed at 07/31/2021 1000 Gross per 24 hour  Intake 2627.3 ml  Output 600 ml  Net 2027.3 ml    LBM: Last BM Date: 07/28/21 Baseline Weight: Weight: 100.7 kg Most recent weight: Weight: 92.4 kg PPS 30%      Palliative Assessment/Data:      Patient Active Problem List   Diagnosis Date Noted   Malnutrition of moderate degree 07/28/2021   Ileus (HCC)    Hypokalemia    Hypophosphatemia    Hypomagnesemia    Intravascular volume depletion 07/18/2021   Internal jugular (IJ) vein thromboembolism, chronic, right ([place]) 07/16/2021   Goals of care, counseling/discussion 07/16/2021   Acute respiratory failure with hypoxia ([place]) 06/22/2021   Abdominal ascites 07/02/2021   Pleural effusion on right 07/07/2021   Hyponatremia 07/13/2021   Genetic testing 05/12/2020   Family history of breast cancer    Family history of ovarian cancer    Family history of kidney cancer    Family history of brain cancer    Essential hypertension, benign 04/06/2020   Pancreatic cancer ([place]) 02/14/2020   Cancer associated pain 02/13/2020   Stage IV adenocarcinoma of pancreas ([place]) 02/03/2020   Screening for colon cancer 01/07/2020   Encounter for screening mammogram for malignant neoplasm of breast 01/07/2020   Need for Tdap vaccination 01/07/2020   Pancreatic mass 12/30/2019   Nephrolithiasis 12/20/2012   GERD (gastroesophageal reflux disease) 12/20/2012   Type 2 diabetes mellitus ([place]) 11/09/2012     Palliative Care Assessment & Plan   Patient Profile:    Assessment:  55 yo lady with life limiting illness of pancreatic cancer, ascites, abdominal pain, LE edema.   Recommendations/Plan: Continue current care.  Remains full code/full scope  Continue efforts to assist with documentation outlining her medical illness to support her daughter's applications for visitation visa.   Offered active listening and supportive care.  Patient's daughter wishes to continue current mode of care.     Goals of Care and Additional Recommendations: Limitations on Scope of Treatment: Full Scope Treatment  Code Status:    Code Status Orders  (From admission, onward)           Start     Ordered   07/16/2021 2022  Full code  Continuous        06/19/2021 2024           Code Status History     Date Active Date Inactive Code Status Order ID Comments User Context   02/14/2020 0213 02/18/2020 2347 Full Code [id number]  Clance Boll, MD Inpatient       Prognosis: Likely weeks at best, she continues to have decline overall and remains at high risk for acute decompensation.  Discharge Planning: To Be Determined although I fear this will be a terminal admission.  Care plan was discussed with IDT.    Thank you for allowing the Palliative Medicine Team to assist in the care of this patient.   Total Time 25 Prolonged Time Billed  no   Greater than 50%  of this time was spent counseling and coordinating care related to the above assessment and plan.  Loistine Chance, MD  Please contact Palliative Medicine Team phone at [phone] for questions and concerns from 7 AM-7 PM.  After 7 PM, please call primary service.

## 2021-07-31 NOTE — Progress Notes (Signed)
PHARMACY - TOTAL PARENTERAL NUTRITION CONSULT NOTE   Indication: Prolonged ileus  Patient Measurements: Height: 5\' 6"  (167.6 cm) Weight: 92.4 kg (203 lb 11.3 oz) IBW/kg (Calculated) : 59.3 TPN AdjBW (KG): 69.6 Body mass index is 32.88 kg/m. Usual Weight:   Assessment: 68 yoF admitted on 10/27 with N/V, abdominal pain.  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  She has had poor PO intake, NPO since 11/5 d/t colonic ileus.  NG tube removed by pt on 11/7, replaced on 11/8.  She is high risk for re-feeding.  GOC:  Due to religious beliefs, patient and family continue to want full scope of care without consideration of scaling back treatment in efforts to focus on comfort.  Glucose / Insulin: Hx of diabetes, not on any medications PTA.  Most recent A1c 8.6% (01/2020) - CBGs improved significantly on 11/11, 97-217 - mSSI: 17 units/ 24 hrs - Glargine 10 units x1 per MD on 11/10 - Insulin in TPN: increased to 50 units (11/11)  Electrolytes: K 3.6 (goal > 4), Mag 2.0 (goal >2), others WNL including CorrCa Renal: SCr low/stable.  BUN up to 46.   Hepatic: WNL TG:  127 (11/9) Intake / Output; MIVF: UOP up 500 ml; NG output 100 mL; Fecal tube removed.   - IVF per MD, adding NS @ 100 on 11/11.  Albumin x4 doses on 11/12 GI Imaging:  11/5 CT: extensive peritoneal metastatic disease, large cystic pelvic masses, pancreatic tail tumor, and some heterogeneity in the liver suspicious for hepatic metastatic lesions.  Central access: Port TPN start date: 11/8  Nutritional Goals: Goal TPN rate is 90 mL/hr (provides 125 g of protein and 2104 kcals per day)  RD Assessment:  (11/8) Estimated Needs Total Energy Estimated Needs: 2100-2300 kcal Total Protein Estimated Needs: 110-125 grams Total Fluid Estimated Needs: >/= 1.6 L/day  Current Nutrition:  NPO  Plan:   Now:  KCl 10 mEq IV x 3 runs  At 18:00 Advance TPN to goal rate 90 mL/hr at 1800  Electrolytes in TPN: Na 59mEq/L, K 50 mEq/L, Ca 77mEq/L, Mg 41mEq/L, and Phos 20 mmol/L. Cl:Ac 1:2  Standard MVI and trace elements in TPN Continue resistant SSI q4h and adjust as needed  Continue regular insulin in TPN:  50 units Thiamine in TPN  IVF per MD  Monitor TPN labs on Mon/Thurs. BMET, Mg & Phos in AM  Gretta Arab PharmD, [place] main pharmacy [phone] 07/31/2021 9:18 AM

## 2021-07-31 NOTE — Progress Notes (Signed)
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  She has undergone extensive treatment since May 2021.  Her cancer seems to have progressed since then.    Events in the last 7 days-patient continues to be tachycardic with a resting heart rate in the 130s to 140s with severe deconditioning and increasing in heart rate with any activity.  Her nausea and vomiting had subsided initially however she had not had any bowel movement so she was given MiraLAX Dulcolax and lactulose for 1 day.  She started having diarrhea and had large bowel movements and a rectal tube was placed at that point.  All the laxatives were stopped.  She received laxatives only 1 day.  Then she started to have intractable nausea vomiting NG tube was placed and she was moved to stepdown unit.  Stat CT of the abdomen and pelvis without contrast(she has severe contrast allergy) possible ischemic bowel and colonic ileus.  PCCM and general surgery was consulted. When she was moved to stepdown unit her respiratory rate was in the 40s and her heart rate was in the 150s. Dr. Barry Dienes saw the patient she did not think patient has ischemic bowel and even if she did have ischemic bowel she was not a candidate for surgery due to peritoneal carcinomatosis.  Her lactic acid was normal. PCCM followed the patient over the weekend and signed off on 07/25/2021. Patient also developed increasing right pleural effusion and increasing ascites. She received albumin infusion for 6 doses. Xarelto was stopped and heparin was started as there was some blood-tinged urine as well as NG tube  drainage.  And she was n.p.o. due to colonic ileus and nausea vomiting with NG tube in place. Her medications were narrowed down on 07/23/2021 to include only the ones she really needs it and was able to be converted to IV.   07/26/2021 2.9 L of fluid taken out paracentesis 07/27/2021 600 cc of hazy amber-colored right thoracentesis fluid sent for studies Patient started on TPN Heparin stopped Lovenox started Patient pulled out NG tube on 07/26/2021 this was replaced on 07/27/2021 as she again started with bilious vomiting   Daughter involved very involved with patient's care and patient remains full code in spite of multiple consultants PCCM oncology palliative care General surgery and hospitalist team explaining to her that her prognosis is extremely poor prognosis.   Assessment & Plan:   Principal Problem:   Acute respiratory failure with hypoxia (HCC) Active Problems:   Type 2 diabetes mellitus (HCC)   Stage IV adenocarcinoma of pancreas (HCC)   Essential hypertension, benign   Abdominal ascites   Pleural effusion on right   Hyponatremia   Internal jugular (IJ) vein thromboembolism, chronic, right (HCC)   Goals of care, counseling/discussion   Intravascular volume depletion   Malnutrition of moderate degree   Ileus (HCC)   Hypokalemia   Hypophosphatemia   Hypomagnesemia  #1 acute hypoxemic respiratory failure secondary to large right pleural effusion status postthoracentesis 07/27/2021 -Improved.  Also in part secondary to poor diaphragmatic excursion with massive abdominal distention. -NG tube placed, status post thoracenteses. -Currently with sats of 100% on room air.  2.  Stage IV adenocarcinoma of the pancreas diagnosed 2021 with mets to the peritoneum, liver. -Patient undergone multiple rounds of chemotherapy and radiation. -Patient with progression of disease. -Patient being followed by oncology Dr. Marin Olp and patient deemed not a candidate for any chemotherapy at this  time. -CT abdomen 07/24/2021-large right effusion substantial ascites extensive peritoneal metastatic disease large cystic pelvic masses pancreatic tail tumor and heterogenicity in the liver suspicious for hepatic metastatic lesions atelectasis in the left lower lobe right middle lobe and right lower lobe stable small pericardial effusion. -Patient status post paracentesis and thoracentesis. -Patient noted to have pulled out NG tube 07/26/2021 and KUB done with worsening colonic ileus and as such NG tube placed back in as patient did have ongoing nausea and emesis. -No significant change with colonic ileus. -Patient with very poor prognosis.   -Palliative care consulted however due to religious believes patient's daughter believes patient is going to be healed and recommended ongoing aggressive treatment at this time.  3.  Malignant ascites -Status post paracentesis 07/26/2021 with cytology consistent with malignant cells consistent with metastatic adenocarcinoma. -2.9 L of fluid removed. -Oncology following  4.  Sinus tachycardia -Likely multifocal secondary to acute illnesses including progressive stage IV adenocarcinoma of the pancreas, colonic ileus, pleural effusion. -2D echo done with normal EF. -Continue Lopressor 7.5 mg IV every 6 hours.  -Follow.  5.  Right IJ thrombus -Continue full dose Lovenox.    6.  Fever -Likely tumor fever. -Fever curve trending down. -Status post course of cefepime and Flagyl. -No further antibiotics needed.  7.  Colonic ileus -Likely secondary to stage IV adenocarcinoma of the pancreas with peritoneal mets in the setting of opioid pain medication. -Patient noted to have pulled out NG tube 07/26/2021 with KUB showing worsening colonic ileus. -NG tube placed back in 07/27/2021 with a output of 100 cc over the past 24 hours. -Rectal tube discontinued. -Patient very weak making mobility difficult.   -Repeat KUB done 07/30/2021, with persistent but mildly  improved distended loops of bowel noted.   -Patient with hypoactive bowel sounds . -Continue NG tube, PT OT, mobilize as able to.   -Minimize narcotics. -Patient seen in consultation by GI who feel patient is not a candidate for colonoscopy and decompression and anesthesia, not a candidate for neostigmine -peritoneal carcinomatosis, risk of arrhythmia, bradycardia, cardiac arrest, respiratory arrest, respiratory depression.  Per GI patient with very poor prognosis.  -Goal to keep potassium approximately at 4, magnesium approximately at 2. -Continue current supportive care.   8.  Hypokalemia/hypophosphatemia/hypomagnesemia -Patient on TPN, being repleted per pharmacy. -Potassium at 3.6 today, phosphorus at 3.6, magnesium at 2.0 -Patient unable to receive KCl IV x4 runs yesterday. -Goal is to keep potassium approximately 4, magnesium at 2. -Electrolytes being managed per pharmacy.  9.  Moderate protein calorie malnutrition -On TPN. -Thiamine added to TPN per RD recommendations due to concern for refeeding syndrome. -IV albumin every 6 hours x1 day.  10.  Hyperglycemia -Likely secondary to TPN. -CBG at 101 this morning.   -Insulin being adjusted in TPN per pharmacy.    DVT prophylaxis: Lovenox Code Status: Full Family Communication: Updated daughter at bedside. Disposition:   Status is: Inpatient  Remains inpatient appropriate because: Current ongoing inpatient care.  Patient with ileus versus small bowel obstruction currently with NG tube placement, currently on TPN, not stable for discharge.       Consultants:  Palliative care: Dr. Rowe Pavy 07/16/2021 PCCM: Dr.Meier 07/24/2021 General surgery: Dr. Barry Dienes 07/24/2021  Gastroenterology: Dr. Therisa Doyne 07/29/2021  Procedures:  Ultrasound-guided thoracentesis 07/27/2021 per Dr. Serafina Royals, IR Upper extremity Dopplers 07/20/2021 Lower extremity Dopplers 07/10/2021 2D echo 07/23/2021 Ultrasound-guided paracentesis: Dr. Laurence Ferrari IR  07/17/2021--3.4 L of yellow fluid removed Ultrasound-guided paracentesis 07/26/2021 2.9 L of hazy fluid removed per Dr.Mugweru, IR CT abdomen and pelvis 07/08/2021, 07/24/2021 KUB 07/29/2021  Antimicrobials:  IV cefepime 07/24/2021>>>> 07/27/2021 IV Flagyl 07/24/2021>>>>> 07/27/2021   Subjective: Patient more alert today.  Laying in bed.  Daughter at bedside.  Daughter stating patient's son came and saw her yesterday and she was more happy about that.  No chest pain.  No shortness of breath.  No BM per RN.  Per RN patient was placed in the chair yesterday.    Objective: Vitals:   07/31/21 0418 07/31/21 0500 07/31/21 0600 07/31/21 0730  BP:  126/73 127/74   Pulse:  (!) 118 (!) 101   Resp:  (!) 22 (!) 23   Temp: 98.2 F (36.8 C)   98.2 F (36.8 C)  TempSrc: Axillary   Axillary  SpO2:  99% 99%   Weight:  92.4 kg    Height:        Intake/Output Summary (Last 24 hours) at 07/31/2021 0857 Last data filed at 07/31/2021 0600 Gross per 24 hour  Intake 1739.99 ml  Output 600 ml  Net 1139.99 ml    Filed Weights   07/27/21 0500 07/28/21 0500 07/31/21 0500  Weight: 94.1 kg 94.1 kg 92.4 kg    Examination:  General exam: NG tube with bilious drainage.  More alert today.   Respiratory system: CTA B anterior lung fields.  No wheezes, no crackles, no rhonchi.  Normal respiratory effort.  Cardiovascular system: Tachycardia.  No murmurs rubs or gallops.  2+ bilateral lower extremity edema.  Gastrointestinal system: Abdomen slightly more distended, slightly tight, hypoactive bowel sounds, nontender to palpation.  No rebound.  No guarding. Central nervous system: More alert today.  Moving extremities spontaneously.  No focal neurological deficits.  Extremities: Symmetric 5 x 5 power. Skin: No rashes, lesions or ulcers Psychiatry: Judgement and insight unable to assess.. Mood & affect appropriate.     Data Reviewed: I have personally reviewed following labs and imaging  studies  CBC: Recent Labs  Lab 07/27/21 0431 07/28/21 0637 07/29/21 0333 07/30/21 0354 07/31/21 0553  WBC 4.6 6.3 8.4 7.0 PENDING  NEUTROABS  --   --   --  4.6 PENDING  HGB 8.8* 9.0* 9.6* 9.3* 8.7*  HCT 28.1* 29.1* 30.9* 30.7* 29.3*  MCV 84.9 85.6 85.4 85.8 87.7  PLT 276 288 259 235 199     Basic Metabolic Panel: Recent Labs  Lab 07/27/21 0431 07/28/21 0637 07/29/21 0333 07/30/21 0354 07/31/21 0553  NA 137 139 139 139 140  K 3.3* 3.8 3.8 3.3* 3.6  CL 107 109 109 109 109  CO2 20* 24 24 23 23   GLUCOSE 242* 289* 264* 231* 122*  BUN 27* 23* 28* 36* 46*  CREATININE 0.44 <0.30* 0.34* <0.30* 0.37*  CALCIUM 7.8* 7.7* 8.0* 7.9* 7.9*  MG 2.2 2.1 2.0 1.9 2.0  PHOS 1.1* 1.7* 2.2* 2.3* 3.6     GFR: Estimated Creatinine Clearance: 93.1 mL/min (A) (by C-G formula based on SCr of 0.37 mg/dL (L)).  Liver Function Tests: Recent Labs  Lab 07/27/21 0431 07/28/21 0637 07/29/21 0333 07/30/21 0354 07/31/21 0553  AST 13* 11* 13* 13* 18  ALT 13 12 11 11 11   ALKPHOS 53 52 55 53 52  BILITOT 1.4* 0.7 0.6  0.5 0.6  PROT 5.4* 5.4* 5.7* 5.8* 5.7*  ALBUMIN 2.4* 2.2* 2.1* 2.1* 2.0*     CBG: Recent Labs  Lab 07/30/21 1604 07/30/21 1958 07/30/21 2345 07/31/21 0325 07/31/21 0738  GLUCAP 150* 115* 97 101* 106*      Recent Results (from the past 240 hour(s))  MRSA Next Gen by PCR, Nasal     Status: Abnormal   Collection Time: 07/24/21  5:58 PM   Specimen: Nasal Mucosa; Nasal Swab  Result Value Ref Range Status   MRSA by PCR Next Gen DETECTED (A) NOT DETECTED Final    Comment: RESULT CALLED TO, READ BACK BY AND VERIFIED WITH: CALDERON,E RN @2141  ON 07/24/21 JACKSON,K (NOTE) The GeneXpert MRSA Assay (FDA approved for NASAL specimens only), is one component of a comprehensive MRSA colonization surveillance program. It is not intended to diagnose MRSA infection nor to guide or monitor treatment for MRSA infections. Test performance is not FDA approved in patients less than 93  years old. Performed at [facility], [place] [address], [place], [place] [address]   Culture, blood (routine x 2)     Status: None   Collection Time: 07/24/21  7:57 PM   Specimen: BLOOD  Result Value Ref Range Status   Specimen Description   Final    BLOOD BLOOD LEFT HAND Performed at [facility] [address], [place], [place] [address]    Special Requests   Final    BOTTLES DRAWN AEROBIC ONLY Blood Culture adequate volume Performed at [facility] [address], [place], [place] [address]    Culture   Final    NO GROWTH 5 DAYS Performed at [facility], [place] [address] [place], [place] [address]    Report Status 07/29/2021 FINAL  Final  Culture, blood (routine x 2)     Status: None   Collection Time: 07/24/21  7:57 PM   Specimen: BLOOD  Result Value Ref Range Status   Specimen Description   Final    BLOOD LEFT WRIST Performed at [facility] [address], [place], [place] [address]    Special Requests   Final    BOTTLES DRAWN AEROBIC ONLY Blood Culture adequate volume Performed at [facility] [address], [place], [place] [address]    Culture   Final    NO GROWTH 5 DAYS Performed at [facility], [place] [address], [place], [place] [address]    Report Status 07/29/2021 FINAL  Final  Urine Culture     Status: Abnormal   Collection Time: 07/25/21  1:36 AM   Specimen: In/Out Cath Urine  Result Value Ref Range Status   Specimen Description   Final    IN/OUT CATH URINE Performed at [facility] [address] [place],  [address]    Special Requests   Final    NONE Performed at [facility], [place] [address] [place],  [address]    Culture MULTIPLE SPECIES PRESENT, SUGGEST RECOLLECTION (A)  Final   Report Status 07/26/2021 FINAL  Final  Culture, body fluid w Gram Stain-bottle     Status: None    Collection Time: 07/26/21  2:29 PM   Specimen: Fluid  Result Value Ref Range Status   Specimen Description FLUID PERITONEAL  Final   Special Requests BOTTLES DRAWN AEROBIC AND ANAEROBIC  Final   Culture   Final    NO GROWTH 5 DAYS Performed at  [facility], [place] [address], [place], [place] [address]    Report Status 07/31/2021 FINAL  Final  Gram stain     Status: None   Collection Time: 07/26/21  2:29 PM   Specimen: Fluid  Result Value Ref Range Status   Specimen Description FLUID PERITONEAL  Final   Special Requests NONE  Final   Gram Stain   Final    MODERATE WBC SEEN NO ORGANISMS SEEN Performed at [facility], [place] [address] [place], [place] [address]    Report Status 07/28/2021 FINAL  Final  Culture, body fluid w Gram Stain-bottle     Status: None (Preliminary result)   Collection Time: 07/27/21  1:15 PM   Specimen: Fluid  Result Value Ref Range Status   Specimen Description FLUID PLEURAL  Final   Special Requests   Final    BOTTLES DRAWN AEROBIC AND ANAEROBIC Blood Culture adequate volume   Culture   Final    NO GROWTH 4 DAYS Performed at [facility], [place] [address], [place], [place] [address]    Report Status PENDING  Incomplete  Gram stain     Status: None   Collection Time: 07/27/21  1:15 PM   Specimen: Fluid  Result Value Ref Range Status   Specimen Description FLUID PLEURAL  Final   Special Requests   Final    BOTTLES DRAWN AEROBIC AND ANAEROBIC Blood Culture adequate volume   Gram Stain   Final    NO SQUAMOUS EPITHELIAL CELLS SEEN NO WBC SEEN NO ORGANISMS SEEN Performed at [facility], [address], [place], [place] [address]    Report Status 07/29/2021 FINAL  Final          Radiology Studies: DG Abd 1 View  Result Date: 07/30/2021 CLINICAL DATA:  Follow-up ileus EXAM: ABDOMEN - 1 VIEW COMPARISON:  Abdominal x-ray 07/29/2021 FINDINGS: Enteric tube tip is in the stomach. Persistent distended loops of bowel across the  mid abdomen which measure up to approximately 9.2 cm in diameter, mildly improved since previous study. No suspicious calcifications visualized. IMPRESSION: Persistent but mildly improved distended loops of bowel. Electronically Signed   By: Ofilia Neas M.D.   On: 07/30/2021 09:11        Scheduled Meds:  chlorhexidine  15 mL Mouth Rinse BID   Chlorhexidine Gluconate Cloth  6 each Topical Daily   enoxaparin (LOVENOX) injection  150 mg Subcutaneous Daily   fentaNYL  1 patch Transdermal Q72H   insulin aspart  0-20 Units Subcutaneous Q4H   mouth rinse  15 mL Mouth Rinse q12n4p   metoprolol tartrate  7.5 mg Intravenous Q6H   pantoprazole (PROTONIX) IV  40 mg Intravenous Q12H   scopolamine  1 patch Transdermal Q72H   sodium chloride flush  10-40 mL Intracatheter Q12H   sodium chloride flush  3 mL Intravenous Q12H   Continuous Infusions:  sodium chloride Stopped (07/27/21 1715)   sodium chloride Stopped (07/30/21 1207)   promethazine (PHENERGAN) injection (IM or IVPB) 12.5 mg (07/24/21 1511)   TPN ADULT (ION) 60 mL/hr at 07/31/21 0000     LOS: 16 days    Time spent: 40 minutes    Irine Seal, MD [facility]   To contact the attending provider between 7A-7P or the covering provider during after hours 7P-7A, please log into the web site [email or web address] and access using universal [place] password for that web site. If you do not have the password, please call  the hospital operator.  07/31/2021, 8:57 AM

## 2021-08-01 ENCOUNTER — Inpatient Hospital Stay (HOSPITAL_COMMUNITY): Payer: 59

## 2021-08-01 DIAGNOSIS — D649 Anemia, unspecified: Secondary | ICD-10-CM | POA: Diagnosis present

## 2021-08-01 DIAGNOSIS — I82409 Acute embolism and thrombosis of unspecified deep veins of unspecified lower extremity: Secondary | ICD-10-CM

## 2021-08-01 DIAGNOSIS — I10 Essential (primary) hypertension: Secondary | ICD-10-CM

## 2021-08-01 DIAGNOSIS — E440 Moderate protein-calorie malnutrition: Secondary | ICD-10-CM

## 2021-08-01 DIAGNOSIS — I8290 Acute embolism and thrombosis of unspecified vein: Secondary | ICD-10-CM

## 2021-08-01 LAB — CBC WITH DIFFERENTIAL/PLATELET
Abs Immature Granulocytes: 0.09 10*3/uL — ABNORMAL HIGH (ref 0.00–0.07)
Basophils Absolute: 0 10*3/uL (ref 0.0–0.1)
Basophils Relative: 1 %
Eosinophils Absolute: 0.1 10*3/uL (ref 0.0–0.5)
Eosinophils Relative: 2 %
HCT: 22.9 % — ABNORMAL LOW (ref 36.0–46.0)
Hemoglobin: 6.7 g/dL — CL (ref 12.0–15.0)
Immature Granulocytes: 2 %
Lymphocytes Relative: 13 %
Lymphs Abs: 0.5 10*3/uL — ABNORMAL LOW (ref 0.7–4.0)
MCH: 26.1 pg (ref 26.0–34.0)
MCHC: 29.3 g/dL — ABNORMAL LOW (ref 30.0–36.0)
MCV: 89.1 fL (ref 80.0–100.0)
Monocytes Absolute: 0.2 10*3/uL (ref 0.1–1.0)
Monocytes Relative: 5 %
Neutro Abs: 3.1 10*3/uL (ref 1.7–7.7)
Neutrophils Relative %: 77 %
Platelets: 164 10*3/uL (ref 150–400)
RBC: 2.57 MIL/uL — ABNORMAL LOW (ref 3.87–5.11)
RDW: 20.6 % — ABNORMAL HIGH (ref 11.5–15.5)
WBC: 4 10*3/uL (ref 4.0–10.5)
nRBC: 2.7 % — ABNORMAL HIGH (ref 0.0–0.2)

## 2021-08-01 LAB — GLUCOSE, CAPILLARY
Glucose-Capillary: 132 mg/dL — ABNORMAL HIGH (ref 70–99)
Glucose-Capillary: 145 mg/dL — ABNORMAL HIGH (ref 70–99)
Glucose-Capillary: 113 mg/dL — ABNORMAL HIGH (ref 70–99)
Glucose-Capillary: 114 mg/dL — ABNORMAL HIGH (ref 70–99)
Glucose-Capillary: 117 mg/dL — ABNORMAL HIGH (ref 70–99)

## 2021-08-01 LAB — COMPREHENSIVE METABOLIC PANEL
ALT: 10 U/L (ref 0–44)
AST: 14 U/L — ABNORMAL LOW (ref 15–41)
Albumin: 3.5 g/dL (ref 3.5–5.0)
Alkaline Phosphatase: 41 U/L (ref 38–126)
Anion gap: 6 (ref 5–15)
BUN: 62 mg/dL — ABNORMAL HIGH (ref 6–20)
CO2: 23 mmol/L (ref 22–32)
Calcium: 8 mg/dL — ABNORMAL LOW (ref 8.9–10.3)
Chloride: 112 mmol/L — ABNORMAL HIGH (ref 98–111)
Creatinine, Ser: 0.45 mg/dL (ref 0.44–1.00)
GFR, Estimated: >60 mL/min (ref >60)
Glucose, Bld: 138 mg/dL — ABNORMAL HIGH (ref 70–99)
Potassium: 3.9 mmol/L (ref 3.5–5.1)
Sodium: 141 mmol/L (ref 135–145)
Total Bilirubin: 1.1 mg/dL (ref 0.3–1.2)
Total Protein: 5.9 g/dL — ABNORMAL LOW (ref 6.5–8.1)

## 2021-08-01 LAB — VITAMIN B12: Vitamin B-12: 5191 pg/mL — ABNORMAL HIGH (ref 180–914)

## 2021-08-01 LAB — PREPARE RBC (CROSSMATCH)

## 2021-08-01 LAB — RETICULOCYTES
Immature Retic Fract: 38.9 % — ABNORMAL HIGH (ref 2.3–15.9)
RBC.: 2.73 MIL/uL — ABNORMAL LOW (ref 3.87–5.11)
Retic Count, Absolute: 103.7 10*3/uL (ref 19.0–186.0)
Retic Ct Pct: 3.8 % — ABNORMAL HIGH (ref 0.4–3.1)

## 2021-08-01 LAB — MAGNESIUM: Magnesium: 1.8 mg/dL (ref 1.7–2.4)

## 2021-08-01 LAB — ABO/RH: ABO/RH(D): A POS

## 2021-08-01 LAB — FOLATE: Folate: 16.8 ng/mL (ref >5.9)

## 2021-08-01 LAB — IRON AND TIBC
Iron: 19 ug/dL — ABNORMAL LOW (ref 28–170)
Saturation Ratios: 19 % (ref 10.4–31.8)
TIBC: 100 ug/dL — ABNORMAL LOW (ref 250–450)
UIBC: 81 ug/dL

## 2021-08-01 LAB — FERRITIN: Ferritin: 115 ng/mL (ref 11–307)

## 2021-08-01 LAB — CULTURE, BODY FLUID W GRAM STAIN -BOTTLE
Culture: NO GROWTH
Special Requests: ADEQUATE

## 2021-08-01 LAB — HEMOGLOBIN AND HEMATOCRIT, BLOOD
HCT: 28.4 % — ABNORMAL LOW (ref 36.0–46.0)
Hemoglobin: 8.7 g/dL — ABNORMAL LOW (ref 12.0–15.0)

## 2021-08-01 LAB — PHOSPHORUS: Phosphorus: 3.9 mg/dL (ref 2.5–4.6)

## 2021-08-01 MED ORDER — FUROSEMIDE 10 MG/ML IJ SOLN
20.0000 mg | Freq: Once | INTRAMUSCULAR | Status: AC
  Administered 2021-08-01: 20 mg via INTRAVENOUS
  Filled 2021-08-01: qty 2

## 2021-08-01 MED ORDER — SODIUM CHLORIDE 0.9% IV SOLUTION: Freq: Once | INTRAVENOUS | Status: AC

## 2021-08-01 MED ORDER — TRAVASOL 10 % IV SOLN
INTRAVENOUS | Status: AC
  Filled 2021-08-01: qty 1252.8

## 2021-08-01 MED ORDER — SIMETHICONE 80 MG PO CHEW
160.0000 mg | CHEWABLE_TABLET | Freq: Four times a day (QID) | ORAL | Status: DC
  Administered 2021-08-01 – 2021-08-03 (×9): 160 mg via ORAL
  Filled 2021-08-01 (×9): qty 2

## 2021-08-01 MED ORDER — MAGNESIUM SULFATE 2 GM/50ML IV SOLN
2.0000 g | Freq: Once | INTRAVENOUS | Status: AC
  Administered 2021-08-01: 2 g via INTRAVENOUS
  Filled 2021-08-01: qty 50

## 2021-08-01 MED ORDER — ACETAMINOPHEN 650 MG RE SUPP
325.0000 mg | Freq: Once | RECTAL | Status: AC
  Administered 2021-08-01: 325 mg via RECTAL
  Filled 2021-08-01: qty 1

## 2021-08-01 NOTE — Progress Notes (Signed)
PHARMACY - TOTAL PARENTERAL NUTRITION CONSULT NOTE   Indication: Prolonged ileus  Patient Measurements: Height: 5\' 6"  (167.6 cm) Weight: 92.5 kg (203 lb 14.8 oz) IBW/kg (Calculated) : 59.3 TPN AdjBW (KG): 69.6 Body mass index is 32.91 kg/m. Usual Weight:   Assessment: 77 yoF admitted on 10/27 with N/V, abdominal pain.  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  She has had poor PO intake, NPO since 11/5 d/t colonic ileus.  NG tube removed by pt on 11/7, replaced on 11/8.  She is high risk for re-feeding.  GOC:  Due to religious beliefs, patient and family continue to want full scope of care without consideration of scaling back treatment in efforts to focus on comfort.  Glucose / Insulin: Hx of diabetes, not on any medications PTA.  Most recent A1c 8.6% (01/2020) - CBGs improved to 101-145 - mSSI: 6 units/ 24 hrs - Insulin in TPN: 50 units (11/11) Electrolytes: K 3.9 (goal > 4), Mag 1.8 (goal >2), Cl elevated, others WNL including CorrCa Renal: SCr low/stable.  BUN up to 62 Hepatic: WNL TG:  127 (11/9) Intake / Output; MIVF: UOP up 950 ml; NG output 200 mL - IVF per MD, adding NS @ 100 on 11/11.  Albumin x4 doses on 11/12.  Transfuse PRBC on 11/13 - Lasix 11/12, 11/13 GI Imaging:  11/5 CT: extensive peritoneal metastatic disease, large cystic pelvic masses, pancreatic tail tumor, and some heterogeneity in the liver suspicious for hepatic metastatic lesions.  Central access: Port TPN start date: 11/8  Nutritional Goals: Goal TPN rate is 90 mL/hr (provides 125 g of protein and 2104 kcals per day)  RD Assessment:  (11/8) Estimated Needs Total Energy Estimated Needs: 2100-2300 kcal Total Protein Estimated Needs: 110-125 grams Total Fluid Estimated Needs: >/= 1.6 L/day  Current Nutrition:  NPO  Plan:  Now:  Magnesium 2g IV bolus At  18:00 Continue TPN at goal rate 90 mL/hr Electrolytes in TPN: Na 45mEq/L, K 50 mEq/L, Ca 58mEq/L, Mg 25mEq/L, and Phos 20 mmol/L. Cl:Ac max Ac Standard MVI and trace elements in TPN Continue resistant SSI q4h and adjust as needed  Continue regular insulin in TPN:  50 units Thiamine in TPN  IVF per MD  Monitor TPN labs on Mon/Thurs.  Gretta Arab PharmD, BCPS Clinical Pharmacist [facility] main pharmacy [phone] 08/01/2021 10:02 AM

## 2021-08-01 NOTE — Progress Notes (Signed)
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  She has undergone extensive treatment since May 2021.  Her cancer seems to have progressed since then.    Events in the last 7 days-patient continues to be tachycardic with a resting heart rate in the 130s to 140s with severe deconditioning and increasing in heart rate with any activity.  Her nausea and vomiting had subsided initially however she had not had any bowel movement so she was given MiraLAX Dulcolax and lactulose for 1 day.  She started having diarrhea and had large bowel movements and a rectal tube was placed at that point.  All the laxatives were stopped.  She received laxatives only 1 day.  Then she started to have intractable nausea vomiting NG tube was placed and she was moved to stepdown unit.  Stat CT of the abdomen and pelvis without contrast(she has severe contrast allergy) possible ischemic bowel and colonic ileus.  PCCM and general surgery was consulted. When she was moved to stepdown unit her respiratory rate was in the 40s and her heart rate was in the 150s. Dr. Barry Dienes saw the patient she did not think patient has ischemic bowel and even if she did have ischemic bowel she was not a candidate for surgery due to peritoneal carcinomatosis.  Her lactic acid was normal. PCCM followed the patient over the weekend and signed off on 07/25/2021. Patient also developed increasing right pleural effusion and increasing ascites. She received albumin infusion for 6 doses. Xarelto was stopped and heparin was started as there was some blood-tinged urine as well as NG tube  drainage.  And she was n.p.o. due to colonic ileus and nausea vomiting with NG tube in place. Her medications were narrowed down on 07/23/2021 to include only the ones she really needs it and was able to be converted to IV.   07/26/2021 2.9 L of fluid taken out paracentesis 07/27/2021 600 cc of hazy amber-colored right thoracentesis fluid sent for studies Patient started on TPN Heparin stopped Lovenox started Patient pulled out NG tube on 07/26/2021 this was replaced on 07/27/2021 as she again started with bilious vomiting   Daughter involved very involved with patient's care and patient remains full code in spite of multiple consultants PCCM oncology palliative care General surgery and hospitalist team explaining to her that her prognosis is extremely poor prognosis.   Assessment & Plan:   Principal Problem:   Acute respiratory failure with hypoxia (HCC) Active Problems:   Type 2 diabetes mellitus (HCC)   Stage IV adenocarcinoma of pancreas (HCC)   Essential hypertension, benign   Abdominal ascites   Pleural effusion on right   Hyponatremia   Internal jugular (IJ) vein thromboembolism, chronic, right (HCC)   Goals of care, counseling/discussion   Intravascular volume depletion   Malnutrition of moderate degree   Ileus (HCC)   Hypokalemia   Hypophosphatemia   Hypomagnesemia   DVT (deep venous thrombosis) (HCC)   Anemia  #1 acute hypoxemic respiratory failure secondary to large right pleural effusion status postthoracentesis 07/27/2021 -Improved.  Also in part secondary to poor diaphragmatic excursion with massive abdominal distention. -NG tube placed, status post  thoracenteses. -Currently with sats of 100% on room air.  2.  Stage IV adenocarcinoma of the pancreas diagnosed 2021 with mets to the peritoneum, liver. -Patient undergone multiple rounds of chemotherapy and radiation. -Patient with progression of disease. -Patient being followed by oncology Dr. Marin Olp and patient deemed  not a candidate for any chemotherapy at this time. -CT abdomen 07/24/2021-large right effusion substantial ascites extensive peritoneal metastatic disease large cystic pelvic masses pancreatic tail tumor and heterogenicity in the liver suspicious for hepatic metastatic lesions atelectasis in the left lower lobe right middle lobe and right lower lobe stable small pericardial effusion. -Patient status post paracentesis and thoracentesis. -Patient noted to have pulled out NG tube 07/26/2021 and KUB done with worsening colonic ileus and as such NG tube placed back in as patient did have ongoing nausea and emesis. -No significant change with colonic ileus. -Patient with very poor prognosis.   -Palliative care consulted however due to religious believes patient's daughter believes patient is going to be healed and recommended ongoing aggressive treatment at this time.  3.  Malignant ascites -Status post paracentesis 07/26/2021 with cytology consistent with malignant cells consistent with metastatic adenocarcinoma. -2.9 L of fluid removed. -Oncology following  4.  Sinus tachycardia -Likely multifocal secondary to acute illnesses including progressive stage IV adenocarcinoma of the pancreas, colonic ileus, pleural effusion. -2D echo done with normal EF. -Improved on current regimen of IV Lopressor.   -Follow.  5.  Right IJ thrombus -Full dose Lovenox.    6.  Fever -Likely tumor fever. -Fever curve trending down. -Status post course of cefepime and Flagyl. -No further antibiotics needed.  7.  Colonic ileus -Likely secondary to stage IV adenocarcinoma of the pancreas with peritoneal mets in the setting of opioid pain medication. -Patient noted to have pulled out NG tube 07/26/2021 with KUB showing worsening colonic ileus. -NG tube placed back in 07/27/2021 with a output of 200 cc over the past 24 hours. -Rectal tube discontinued. -Patient very weak making mobility difficult.   -Repeat KUB done  07/30/2021, with persistent but mildly improved distended loops of bowel noted.   -KUB with similar appearing colonic obstruction versus ileus.  -Patient with hypoactive bowel sounds . -Continue NG tube, PT OT, mobilize as able to.   -Minimize narcotics. -Simethicone 4 times daily. -Patient seen in consultation by GI who feel patient is not a candidate for colonoscopy and decompression and anesthesia, not a candidate for neostigmine -peritoneal carcinomatosis, risk of arrhythmia, bradycardia, cardiac arrest, respiratory arrest, respiratory depression.  Per GI patient with very poor prognosis.  -Goal to keep potassium approximately at 4, magnesium approximately at 2. -Continue current supportive care.   8.  Hypokalemia/hypophosphatemia/hypomagnesemia -Patient on TPN, being repleted per pharmacy. -Potassium at 3.9 today, phosphorus at 3.9, magnesium at 1.8. -Goal is to keep potassium approximately 4, magnesium at 2. -Electrolytes being managed per pharmacy.  9.  Moderate protein calorie malnutrition -On TPN. -Thiamine added to TPN per RD recommendations due to concern for refeeding syndrome. -Status post IV albumin every 6 hours x1 day (07/31/2021). -Albumin at 3.5 today, likely transient improvement. -Follow.  10.  Hyperglycemia -Likely secondary to TPN -CBG 132 this morning -Insulin being adjusted in TPN per pharmacy.  11.  Anemia -Likely secondary to stage IV pancreatic adenocarcinoma and recent chemotherapy. -Patient with no overt bleeding. -NG tube nonbloody, nonbilious. -Check an anemia panel. -Transfuse 1 unit packed red blood cells.   DVT prophylaxis: Lovenox Code Status: Full Family Communication: Updated daughter at bedside. Disposition:  Status is: Inpatient  Remains inpatient appropriate because: Current ongoing inpatient care.  Patient with ileus versus small bowel obstruction currently with NG tube placement, currently on TPN, not stable for  discharge.       Consultants:  Palliative care: Dr. Rowe Pavy 07/16/2021 PCCM: Dr.Meier 07/24/2021 General surgery: Dr. Barry Dienes 07/24/2021 Gastroenterology: Dr. Therisa Doyne 07/29/2021  Procedures:  Ultrasound-guided thoracentesis 07/27/2021 per Dr. Serafina Royals, IR Upper extremity Dopplers 07/20/2021 Lower extremity Dopplers 06/28/2021 2D echo 07/23/2021 Ultrasound-guided paracentesis: Dr. Laurence Ferrari IR 07/17/2021--3.4 L of yellow fluid removed Ultrasound-guided paracentesis 07/26/2021 2.9 L of hazy fluid removed per Dr.Mugweru, IR CT abdomen and pelvis 07/05/2021, 07/24/2021 KUB 07/29/2021, 07/30/2021, 08/01/2021 Transfuse 1 unit packed red blood cells pending.  Antimicrobials:  IV cefepime 07/24/2021>>>> 07/27/2021 IV Flagyl 07/24/2021>>>>> 07/27/2021   Subjective: Patient sitting upright in bed.  More alert today.  Seems to be interacting better.  Daughter at bedside.  Patient asking for sips of water.  Patient denies any chest pain.  No shortness of breath.  Some right upper quadrant abdominal pain.  No flatus.  No bowel movement per daughter.  No overt bleeding noted.  NG tube with bilious emesis noted.   Objective: Vitals:   08/01/21 0442 08/01/21 0500 08/01/21 0600 08/01/21 0800  BP:  (!) 130/56 121/63   Pulse:  99 94   Resp:  19 17   Temp: 97.6 F (36.4 C)   97.6 F (36.4 C)  TempSrc: Oral   Axillary  SpO2:  100% 99%   Weight:  92.5 kg    Height:        Intake/Output Summary (Last 24 hours) at 08/01/2021 1008 Last data filed at 08/01/2021 0600 Gross per 24 hour  Intake 2640.62 ml  Output 1150 ml  Net 1490.62 ml   Filed Weights   07/28/21 0500 07/31/21 0500 08/01/21 0500  Weight: 94.1 kg 92.4 kg 92.5 kg    Examination:  General exam: NG tube with bilious drainage.  More alert. Respiratory system: Lungs clear to auscultation bilaterally.  No wheezes, no crackles, no rhonchi.  Normal respiratory effort. Cardiovascular system: Regular rate rhythm no murmurs rubs or gallops.   1-2+ bilateral lower extremity edema.  Gastrointestinal system: Abdomen distended, some tenderness to palpation right upper quadrant, hypoactive bowel sounds.  No rebound.  No guarding.  Central nervous system: More alert today.  Moving extremities spontaneously.  No focal neurological deficits.  Extremities: Symmetric 5 x 5 power. Skin: No rashes, lesions or ulcers Psychiatry: Judgement and insight unable to assess.. Mood & affect appropriate.     Data Reviewed: I have personally reviewed following labs and imaging studies  CBC: Recent Labs  Lab 07/28/21 0637 07/29/21 0333 07/30/21 0354 07/31/21 0553 08/01/21 0500  WBC 6.3 8.4 7.0 5.9 4.0  NEUTROABS  --   --  4.6 4.1 3.1  HGB 9.0* 9.6* 9.3* 8.7* 6.7*  HCT 29.1* 30.9* 30.7* 29.3* 22.9*  MCV 85.6 85.4 85.8 87.7 89.1  PLT 288 259 235 199 169    Basic Metabolic Panel: Recent Labs  Lab 07/28/21 0637 07/29/21 0333 07/30/21 0354 07/31/21 0553 08/01/21 0500  NA 139 139 139 140 141  K 3.8 3.8 3.3* 3.6 3.9  CL 109 109 109 109 112*  CO2 24 24 23 23 23   GLUCOSE 289* 264* 231* 122* 138*  BUN 23* 28* 36* 46* 62*  CREATININE <0.30* 0.34* <0.30* 0.37* 0.45  CALCIUM 7.7* 8.0* 7.9* 7.9* 8.0*  MG 2.1 2.0 1.9 2.0 1.8  PHOS 1.7* 2.2* 2.3* 3.6 3.9  GFR: Estimated Creatinine Clearance: 93.2 mL/min (by C-G formula based on SCr of 0.45 mg/dL).  Liver Function Tests: Recent Labs  Lab 07/28/21 0637 07/29/21 0333 07/30/21 0354 07/31/21 0553 08/01/21 0500  AST 11* 13* 13* 18 14*  ALT 12 11 11 11 10   ALKPHOS 52 55 53 52 41  BILITOT 0.7 0.6 0.5 0.6 1.1  PROT 5.4* 5.7* 5.8* 5.7* 5.9*  ALBUMIN 2.2* 2.1* 2.1* 2.0* 3.5    CBG: Recent Labs  Lab 07/31/21 1647 07/31/21 1937 07/31/21 2335 08/01/21 0342 08/01/21 0816  GLUCAP 101* 123* 115* 132* 145*     Recent Results (from the past 240 hour(s))  MRSA Next Gen by PCR, Nasal     Status: Abnormal   Collection Time: 07/24/21  5:58 PM   Specimen: Nasal Mucosa; Nasal Swab   Result Value Ref Range Status   MRSA by PCR Next Gen DETECTED (A) NOT DETECTED Final    Comment: RESULT CALLED TO, READ BACK BY AND VERIFIED WITH: CALDERON,E RN @2141  ON 07/24/21 JACKSON,K (NOTE) The GeneXpert MRSA Assay (FDA approved for NASAL specimens only), is one component of a comprehensive MRSA colonization surveillance program. It is not intended to diagnose MRSA infection nor to guide or monitor treatment for MRSA infections. Test performance is not FDA approved in patients less than 22 years old. Performed at [facility], [place] [address], [place], [place] [address]   Culture, blood (routine x 2)     Status: None   Collection Time: 07/24/21  7:57 PM   Specimen: BLOOD  Result Value Ref Range Status   Specimen Description   Final    BLOOD BLOOD LEFT HAND Performed at [facility] [address], [place], [place] [address]    Special Requests   Final    BOTTLES DRAWN AEROBIC ONLY Blood Culture adequate volume Performed at [facility] [address], [place], [place] [address]    Culture   Final    NO GROWTH 5 DAYS Performed at [facility], [place] [address], [place], [place] [address]    Report Status 07/29/2021 FINAL  Final  Culture, blood (routine x 2)     Status: None   Collection Time: 07/24/21  7:57 PM   Specimen: BLOOD  Result Value Ref Range Status   Specimen Description   Final    BLOOD LEFT WRIST Performed at [facility] [address] [place], [place] [address]    Special Requests   Final    BOTTLES DRAWN AEROBIC ONLY Blood Culture adequate volume Performed at [facility] [address] [place], [place] [address]    Culture   Final    NO GROWTH 5 DAYS Performed at [facility], [place] [address] [place], [place] [address]    Report Status 07/29/2021 FINAL  Final  Urine Culture     Status: Abnormal   Collection Time: 07/25/21  1:36 AM    Specimen: In/Out Cath Urine  Result Value Ref Range Status   Specimen Description   Final    IN/OUT CATH URINE Performed at [facility] [address] [place], [place] [address]    Special Requests   Final    NONE Performed at [facility], [place] [address], [place], [place] [address]    Culture MULTIPLE SPECIES PRESENT, SUGGEST RECOLLECTION (A)  Final   Report Status 07/26/2021 FINAL  Final  Culture, body fluid w Gram Stain-bottle  Status: None   Collection Time: 07/26/21  2:29 PM   Specimen: Fluid  Result Value Ref Range Status   Specimen Description FLUID PERITONEAL  Final   Special Requests BOTTLES DRAWN AEROBIC AND ANAEROBIC  Final   Culture   Final    NO GROWTH 5 DAYS Performed at [facility], [place] [address], [place], [place] [address]    Report Status 07/31/2021 FINAL  Final  Gram stain     Status: None   Collection Time: 07/26/21  2:29 PM   Specimen: Fluid  Result Value Ref Range Status   Specimen Description FLUID PERITONEAL  Final   Special Requests NONE  Final   Gram Stain   Final    MODERATE WBC SEEN NO ORGANISMS SEEN Performed at [facility], [place] [address] [place], [place] [address]    Report Status 07/28/2021 FINAL  Final  Culture, body fluid w Gram Stain-bottle     Status: None   Collection Time: 07/27/21  1:15 PM   Specimen: Fluid  Result Value Ref Range Status   Specimen Description FLUID PLEURAL  Final   Special Requests   Final    BOTTLES DRAWN AEROBIC AND ANAEROBIC Blood Culture adequate volume   Culture   Final    NO GROWTH 5 DAYS Performed at [facility], [place] [address] [place], [place] [address]    Report Status 08/01/2021 FINAL  Final  Gram stain     Status: None   Collection Time: 07/27/21  1:15 PM   Specimen: Fluid  Result Value Ref Range Status   Specimen Description FLUID PLEURAL  Final   Special Requests   Final    BOTTLES DRAWN AEROBIC AND ANAEROBIC Blood Culture  adequate volume   Gram Stain   Final    NO SQUAMOUS EPITHELIAL CELLS SEEN NO WBC SEEN NO ORGANISMS SEEN Performed at [facility], [place] [address] [place], [place] [address]    Report Status 07/29/2021 FINAL  Final          Radiology Studies: DG Abd 1 View  Result Date: 08/01/2021 CLINICAL DATA:  55 year old female with bowel obstruction. EXAM: ABDOMEN - 1 VIEW COMPARISON:  07/24/2021, 07/29/2021, 07/30/2021 FINDINGS: Similar appearing gaseous distension of the transverse colon measuring up to approximately 9 cm near the hepatic flexure. Similar appearing of gastric decompression tube with the tip in the stomach. No significant small bowel gaseous distension. Relative paucity of distal colonic gas. IMPRESSION: Similar appearing colonic obstruction versus ileus. Electronically Signed   By: Ruthann Cancer M.D.   On: 08/01/2021 09:46        Scheduled Meds:  sodium chloride   Intravenous Once   acetaminophen  325 mg Rectal Once   chlorhexidine  15 mL Mouth Rinse BID   Chlorhexidine Gluconate Cloth  6 each Topical Daily   enoxaparin (LOVENOX) injection  150 mg Subcutaneous Daily   fentaNYL  1 patch Transdermal Q72H   insulin aspart  0-20 Units Subcutaneous Q4H   mouth rinse  15 mL Mouth Rinse q12n4p   metoprolol tartrate  7.5 mg Intravenous Q6H   pantoprazole (PROTONIX) IV  40 mg Intravenous Q12H   scopolamine  1 patch Transdermal Q72H   sodium chloride flush  10-40 mL Intracatheter Q12H   sodium chloride flush  3 mL Intravenous Q12H   Continuous Infusions:  sodium chloride Stopped (07/27/21 1715)   sodium chloride 100 mL/hr at 08/01/21 0227   promethazine (PHENERGAN) injection (IM or IVPB)  12.5 mg (07/24/21 1511)   TPN ADULT (ION) 90 mL/hr at 07/31/21 2300     LOS: 17 days    Time spent: 40 minutes    Irine Seal, MD [facility]   To contact the attending provider between 7A-7P or the covering provider during after hours 7P-7A, please log into the  web site [email or web address] and access using universal  password for that web site. If you do not have the password, please call the hospital operator.  08/01/2021, 10:08 AM

## 2021-08-02 LAB — GLUCOSE, CAPILLARY
Glucose-Capillary: 113 mg/dL — ABNORMAL HIGH (ref 70–99)
Glucose-Capillary: 118 mg/dL — ABNORMAL HIGH (ref 70–99)
Glucose-Capillary: 118 mg/dL — ABNORMAL HIGH (ref 70–99)
Glucose-Capillary: 121 mg/dL — ABNORMAL HIGH (ref 70–99)
Glucose-Capillary: 124 mg/dL — ABNORMAL HIGH (ref 70–99)
Glucose-Capillary: 124 mg/dL — ABNORMAL HIGH (ref 70–99)
Glucose-Capillary: 129 mg/dL — ABNORMAL HIGH (ref 70–99)

## 2021-08-02 LAB — COMPREHENSIVE METABOLIC PANEL
ALT: 11 U/L (ref 0–44)
AST: 14 U/L — ABNORMAL LOW (ref 15–41)
Albumin: 3 g/dL — ABNORMAL LOW (ref 3.5–5.0)
Alkaline Phosphatase: 56 U/L (ref 38–126)
Anion gap: 6 (ref 5–15)
BUN: 77 mg/dL — ABNORMAL HIGH (ref 6–20)
CO2: 23 mmol/L (ref 22–32)
Calcium: 8.2 mg/dL — ABNORMAL LOW (ref 8.9–10.3)
Chloride: 111 mmol/L (ref 98–111)
Creatinine, Ser: 0.51 mg/dL (ref 0.44–1.00)
GFR, Estimated: >60 mL/min (ref >60)
Glucose, Bld: 120 mg/dL — ABNORMAL HIGH (ref 70–99)
Potassium: 4 mmol/L (ref 3.5–5.1)
Sodium: 140 mmol/L (ref 135–145)
Total Bilirubin: 1 mg/dL (ref 0.3–1.2)
Total Protein: 6.1 g/dL — ABNORMAL LOW (ref 6.5–8.1)

## 2021-08-02 LAB — CBC WITH DIFFERENTIAL/PLATELET
Abs Immature Granulocytes: 0.1 10*3/uL — ABNORMAL HIGH (ref 0.00–0.07)
Basophils Absolute: 0 10*3/uL (ref 0.0–0.1)
Basophils Relative: 1 %
Eosinophils Absolute: 0.1 10*3/uL (ref 0.0–0.5)
Eosinophils Relative: 2 %
HCT: 28.8 % — ABNORMAL LOW (ref 36.0–46.0)
Hemoglobin: 8.9 g/dL — ABNORMAL LOW (ref 12.0–15.0)
Immature Granulocytes: 2 %
Lymphocytes Relative: 13 %
Lymphs Abs: 0.5 10*3/uL — ABNORMAL LOW (ref 0.7–4.0)
MCH: 26.9 pg (ref 26.0–34.0)
MCHC: 30.9 g/dL (ref 30.0–36.0)
MCV: 87 fL (ref 80.0–100.0)
Monocytes Absolute: 0.2 10*3/uL (ref 0.1–1.0)
Monocytes Relative: 4 %
Neutro Abs: 3.3 10*3/uL (ref 1.7–7.7)
Neutrophils Relative %: 78 %
Platelets: 196 10*3/uL (ref 150–400)
RBC: 3.31 MIL/uL — ABNORMAL LOW (ref 3.87–5.11)
RDW: 20.2 % — ABNORMAL HIGH (ref 11.5–15.5)
WBC: 4.2 10*3/uL (ref 4.0–10.5)
nRBC: 1.4 % — ABNORMAL HIGH (ref 0.0–0.2)

## 2021-08-02 LAB — TYPE AND SCREEN
ABO/RH(D): A POS
Antibody Screen: NEGATIVE
Unit division: 0

## 2021-08-02 LAB — BPAM RBC
Blood Product Expiration Date: 202212062359
ISSUE DATE / TIME: 202211131456
Unit Type and Rh: 6200

## 2021-08-02 LAB — PHOSPHORUS: Phosphorus: 4.3 mg/dL (ref 2.5–4.6)

## 2021-08-02 LAB — MAGNESIUM: Magnesium: 2.3 mg/dL (ref 1.7–2.4)

## 2021-08-02 LAB — TRIGLYCERIDES: Triglycerides: 95 mg/dL (ref <150)

## 2021-08-02 MED ORDER — TRAVASOL 10 % IV SOLN
INTRAVENOUS | Status: AC
  Filled 2021-08-02: qty 1252.8

## 2021-08-02 NOTE — Progress Notes (Signed)
Nutrition Follow-up  DOCUMENTATION CODES:   Non-severe (moderate) malnutrition in context of chronic illness, Obesity unspecified  INTERVENTION:  - continue TPN per Pharmacist orders.    NUTRITION DIAGNOSIS:   Moderate Malnutrition related to chronic illness, cancer and cancer related treatments as evidenced by mild fat depletion, mild muscle depletion. -ongoing  GOAL:   Patient will meet greater than or equal to 90% of their needs -met with TPN regimen  MONITOR:   Diet advancement, Labs, Weight trends, I & O's, Other (Comment) (TPN regimen)  ASSESSMENT:   55 yo female with medical history of stage 4 pancreatic adenocarcinoma with peritoneal carcinomatosis and pleural mets, lymph node involvement, RIJ thrombus, type 2 DM, HTN, and HLD. She presented to the ED with abdominal pain and N/V. Patient was diagnosed with pancreatic cancer in 01/2020, followed by Dr. Marin Olp, and has undergone extensive treatment. Patient with decreased appetite, generalized edema, and recurrent ascites.  NGT accidentally removed by patient today at ~0630 and remains out at this time. Able to talk with daughter at bedside. Possible plan for replacement dependent on if patient experiences N/V without NGT in place.   She remains NPO and is receiving TPN at goal rate of 90 ml/hr which is providing 2104 kcal and 125 grams protein/24 hours.   Weight yesterday was the lowest weight this admission. Non-pitting edema to BUE and moderate pitting edema to BLE documented in the edema section of flow sheet; previously had moderate pitting edema to all extremities so this could, at least in part, attribute to weight loss.   Last thoracentesis was on 11/8 and last paracentesis was on 11/5.    Labs reviewed; CBGs: 124, 113, 129, 118 mg/dl, BUN: 77 mg/dl, Ca: 8.2 mg/dl. Medications reviewed; sliding scale novolog.    Diet Order:   Diet Order             Diet NPO time specified Except for: Ice Chips  Diet effective  now                   EDUCATION NEEDS:   Education needs have been addressed  Skin:  Skin Assessment: Reviewed RN Assessment  Last BM:  11/7 (type 7)  Height:   Ht Readings from Last 1 Encounters:  06/27/2021 _0  (1.676 m)    Weight:   Wt Readings from Last 1 Encounters:  08/01/21 92.5 kg    Estimated Nutritional Needs:  Kcal:  2100-2300 kcal Protein:  110-125 grams Fluid:  >/= 1.6 L/day      Jarome Matin, MS, RD, LDN, CNSC Inpatient Clinical Dietitian RD pager # available in [place]  After hours/weekend pager # available in [facility]

## 2021-08-02 NOTE — Progress Notes (Signed)
At 2305, charge nurse, rapid response, on call NP called d/t GI bleed.   Pt bed pad was soaked with bright red blood.  Pt vitals normal except for HR tachy in 120's and complaints of pain 10/10.  Pain meds and scheduled metoprolol administered.   CBC drawn and hemoglobin down 0.4 (to 8.5)  Provider aware.  At 0240, pt bed pad again soaked with blood.  Contacted charge nurse and on call NP.  No new orders.    Will continue to monitor.

## 2021-08-02 NOTE — Progress Notes (Signed)
    OVERNIGHT PROGRESS REPORT  Notified by RN for an amount of blood during a bowel movement. CBC will be drawn to check on current level. Will transfuse as indicated.    Gershon Cull MSNA MSN ACNPC-AG Acute Care Nurse Practitioner [place]

## 2021-08-02 NOTE — Progress Notes (Signed)
Daily Progress Note   Patient Name: Angela Wilson       Date: 08/02/2021 DOB: 1965-10-14  Age: 55 y.o. MRN#: [id number] Attending Physician: Eugenie Filler, MD Primary Care Physician: Just, Laurita Quint, FNP (Inactive) Admit Date: 06/21/2021  Reason for Consultation/Follow-up: Establishing goals of care  Subjective: Patient is resting in bed, she pulled out her NGT last night, patient complaining of more abdominal distension and pain this morning, she got IV Dilaudid which provided relief. I met with her daughter at bedside and we talked about not re inserting NGT if the patient doesn't want it anymore, we talked about using PRN opioids for symptom relief.       Length of Stay: 18  Current Medications: Scheduled Meds:   chlorhexidine  15 mL Mouth Rinse BID   Chlorhexidine Gluconate Cloth  6 each Topical Daily   enoxaparin (LOVENOX) injection  150 mg Subcutaneous Daily   fentaNYL  1 patch Transdermal Q72H   insulin aspart  0-20 Units Subcutaneous Q4H   mouth rinse  15 mL Mouth Rinse q12n4p   metoprolol tartrate  7.5 mg Intravenous Q6H   pantoprazole (PROTONIX) IV  40 mg Intravenous Q12H   scopolamine  1 patch Transdermal Q72H   simethicone  160 mg Oral QID   sodium chloride flush  10-40 mL Intracatheter Q12H   sodium chloride flush  3 mL Intravenous Q12H    Continuous Infusions:  sodium chloride Stopped (07/27/21 1715)   promethazine (PHENERGAN) injection (IM or IVPB) 12.5 mg (07/24/21 1511)   TPN ADULT (ION) 90 mL/hr at 08/01/21 1736   TPN ADULT (ION)      PRN Meds: sodium chloride, acetaminophen **OR** acetaminophen, HYDROmorphone (DILAUDID) injection, ketorolac, lip balm, LORazepam, metoprolol tartrate, ondansetron **OR** ondansetron (ZOFRAN) IV, phenol, prochlorperazine,  promethazine (PHENERGAN) injection (IM or IVPB), sodium chloride flush, sodium chloride flush  Physical Exam         Appears chronically ill fatigued Regular work of breathing Has O2 [place] Abdomen distended Has edema  Pulled out NG tube  Vital Signs: BP 119/65   Pulse 91   Temp 98.3 F (36.8 C) (Oral)   Resp 20   Ht 5' 6"  (1.676 m)   Wt 92.5 kg   SpO2 98%   BMI 32.91 kg/m  SpO2: SpO2: 98 % O2 Device: O2 Device:  Room Air O2 Flow Rate: O2 Flow Rate (L/min): 0 L/min  Intake/output summary:  Intake/Output Summary (Last 24 hours) at 08/02/2021 0926 Last data filed at 08/02/2021 0800 Gross per 24 hour  Intake 3232.9 ml  Output 600 ml  Net 2632.9 ml    LBM: Last BM Date: 07/28/21 Baseline Weight: Weight: 100.7 kg Most recent weight: Weight: 92.5 kg PPS 30%      Palliative Assessment/Data:      Patient Active Problem List   Diagnosis Date Noted   DVT (deep venous thrombosis) (HCC)    Anemia    Malnutrition of moderate degree 07/28/2021   Ileus (HCC)    Hypokalemia    Hypophosphatemia    Hypomagnesemia    Intravascular volume depletion 07/18/2021   Internal jugular (IJ) vein thromboembolism, chronic, 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/16/2021   Goals of care, counseling/discussion 07/16/2021   Acute respiratory failure with hypoxia ([place]) 06/24/2021   Abdominal ascites 07/04/2021   Pleural effusion on right 07/07/2021   Hyponatremia 06/23/2021   Genetic testing 05/12/2020   Family history of breast cancer    Family history of ovarian cancer    Family history of kidney cancer    Family history of brain cancer    Essential hypertension, benign 04/06/2020   Pancreatic cancer ([place]) 02/14/2020   Cancer associated pain 02/13/2020   Stage IV adenocarcinoma of pancreas ([place]) 02/03/2020   Screening for colon cancer 01/07/2020   Encounter for screening mammogram for malignant neoplasm of breast 01/07/2020   Need for Tdap vaccination 01/07/2020   Pancreatic mass 12/30/2019    Nephrolithiasis 12/20/2012   GERD (gastroesophageal reflux disease) 12/20/2012   Type 2 diabetes mellitus ([place]) 11/09/2012    Palliative Care Assessment & Plan   Patient Profile:    Assessment:  55 yo lady with life limiting illness of pancreatic cancer, ascites, abdominal pain, LE edema.   Recommendations/Plan: Continue current care.  Remains full code/full scope  Continue efforts to assist with documentation outlining her medical illness to support her daughter's applications for visitation visa.   Offered active listening and supportive care.  Patient's daughter wishes to continue current mode of care.   Re discussed broad goals of care today, patient pulled out NGT, we talked about using PRN IV dilaudid for pain and not re inserting the NGT, the patient will discuss further with her daughter today. Remains full code for now.    Goals of Care and Additional Recommendations: Limitations on Scope of Treatment: Full Scope Treatment  Code Status:    Code Status Orders  (From admission, onward)           Start     Ordered   07/11/2021 2022  Full code  Continuous        07/01/2021 2024           Code Status History     Date Active Date Inactive Code Status Order ID Comments User Context   02/14/2020 0213 02/18/2020 2347 Full Code [id number]  Clance Boll, MD Inpatient       Prognosis: Likely weeks at best, she continues to have decline overall and remains at high risk for acute decompensation.  Discharge Planning: To Be Determined although I fear this will be a terminal admission.  Care plan was discussed with IDT.    Thank you for allowing the Palliative Medicine Team to assist in the care of this patient.   Total Time 25 Prolonged Time Billed  no   Greater than 50%  of this  time was spent counseling and coordinating care related to the above assessment and plan.  Loistine Chance, MD  Please contact Palliative Medicine Team phone at [phone] for questions and  concerns from 7 AM-7 PM.  After 7 PM, please call primary service.

## 2021-08-02 NOTE — Progress Notes (Signed)
PHARMACY - TOTAL PARENTERAL NUTRITION CONSULT NOTE   Indication: Prolonged ileus  Patient Measurements: Height: 5\' 6"  (167.6 cm) Weight: 92.5 kg (203 lb 14.8 oz) IBW/kg (Calculated) : 59.3 TPN AdjBW (KG): 69.6 Body mass index is 32.91 kg/m. Usual Weight:   Assessment: 77 yoF admitted on 10/27 with N/V, abdominal pain.  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  She has had poor PO intake, NPO since 11/5 d/t colonic ileus.  NG tube removed by pt on 11/7, replaced on 11/8.  She is high risk for re-feeding.  GOC:  Due to religious beliefs, patient and family continue to want full scope of care without consideration of scaling back treatment in efforts to focus on comfort.  Glucose / Insulin: Hx of diabetes, not on any medications PTA.  Most recent A1c 8.6% (01/2020) - CBGs improved to 113-145 - mSSI: 6 units/ 24 hrs - Insulin in TPN: 50 units (11/11) Electrolytes: K 4 (goal > 4), Mag 2.3 (goal >2), others WNL including CorrCa Renal: SCr low/stable.  BUN up to 77 Hepatic: WNL TG: 95 (11/14) Intake / Output; MIVF: UOP up 500 ml; no NG output charted, NG removed 11/14 - IVF per MD, adding NS @ 100 on 11/11 stopped 11/13.  Albumin x4 doses on 11/12.  Transfuse PRBC on 11/13 - Lasix 11/12, 11/13 GI Imaging:  11/5 CT: extensive peritoneal metastatic disease, large cystic pelvic masses, pancreatic tail tumor, and some heterogeneity in the liver suspicious for hepatic metastatic lesions.  Central access: Port TPN start date: 11/8  Nutritional Goals: Goal TPN rate is 90 mL/hr (provides 125 g of protein and 2104 kcals per day)  RD Assessment:  (11/8) Estimated Needs Total Energy Estimated Needs: 2100-2300 kcal Total Protein Estimated Needs: 110-125 grams Total Fluid Estimated Needs: >/= 1.6 L/day  Current Nutrition:  NPO  Plan:  Continue TPN at goal  rate 90 mL/hr Electrolytes in TPN: Na 42mEq/L, K 50 mEq/L, Ca 22mEq/L, Mg 86mEq/L, and Phos 20 mmol/L. Cl:Ac max Ac Standard MVI and trace elements in TPN Continue resistant SSI q4h and adjust as needed  Continue regular insulin in TPN:  50 units Thiamine in TPN  IVF per MD  (none currently) Monitor TPN labs on Mon/Thurs. CMET, Mg, and Phos in AM  Ulice Dash D  08/02/2021 8:15 AM

## 2021-08-02 NOTE — Progress Notes (Addendum)
HEMATOLOGY-ONCOLOGY PROGRESS NOTE  SUBJECTIVE: NG tube came out overnight.  She has not had any recurrent vomiting this morning.  Abdomen remains distended.  Oncology History  Pancreatic cancer ([place])  02/14/2020 Initial Diagnosis   Pancreatic cancer ([place])   02/25/2020 - 11/10/2020 Chemotherapy          03/24/2020 Genetic Testing   Negative genetic testing:  No pathogenic variants detected on the [facility] Common Hereditary Cancers Panel + Pancreatic Cancer Panel + Pancreatitis Genes. Two variants of uncertain significance (VUS) were detected - one in the AXIN2 gene called c.1855G>A and a second in the MUTYH gene called c.932G>A. The report date is 03/24/2020.  The Common Hereditary Cancers Panel offered by [facility] includes sequencing and/or deletion duplication testing of the following 48 genes: APC, ATM, AXIN2, BARD1, BMPR1A, BRCA1, BRCA2, BRIP1, CDH1, CDK4, CDKN2A (p14ARF), CDKN2A (p16INK4a), CHEK2, CTNNA1, DICER1, EPCAM (Deletion/duplication testing only), GREM1 (promoter region deletion/duplication testing only), KIT, MEN1, MLH1, MSH2, MSH3, MSH6, MUTYH, NBN, NF1, NTHL1, PALB2, PDGFRA, PMS2, POLD1, POLE, PTEN, RAD50, RAD51C, RAD51D, RNF43, SDHB, SDHC, SDHD, SMAD4, SMARCA4. STK11, TP53, TSC1, TSC2, and VHL.  The following genes were evaluated for sequence changes only: SDHA and HOXB13 c.251G>A variant only.  The [facility] Pancreatic Cancer Panel with Chronic Pancreatitis Genes includes sequencing and/or deletion duplication analysis of the following 26 genes: APC, ATM, BMPR1A, BRCA1, BRCA2, CDKN2A, EPCAM, MEN1, MLH1, MSH2, MSH6, NF1, PALB2, PMS2, SMAD4, STK11, TP53, TSC1, TSC2, VHL, CASR, CFTR, CPA1, CTRC, PRSS1, and SPINK1. The [facility] and PALLD genes were also analyzed.   11/25/2020 - 01/27/2021 Chemotherapy          02/17/2021 - 02/17/2021 Chemotherapy          02/17/2021 - 02/19/2021 Chemotherapy          03/04/2021 - 04/15/2021 Chemotherapy          04/27/2021 - 06/02/2021 Chemotherapy    Patient is on Treatment Plan : PANCREAS 5-FU Y8,6,57,84,69,62 q56d     07/13/2021 -  Chemotherapy   Patient is on Treatment Plan : PANCREAS Liposomal Irinotecan / Leucovorin / 5-FU  q14d      PHYSICAL EXAMINATION:  Vitals:   08/02/21 0400 08/02/21 0700  BP: 129/65 119/65  Pulse: (!) 104 91  Resp: (!) 24 20  Temp:    SpO2: 97% 98%   Filed Weights   07/28/21 0500 07/31/21 0500 08/01/21 0500  Weight: 94.1 kg 92.4 kg 92.5 kg    Intake/Output from previous day: 11/13 0701 - 11/14 0700 In: 3232.9 [I.V.:2892.6; Blood:302; IV Piggyback:38.3] Out: 500 [Urine:500]  GENERAL: Alert, no distress SKIN: skin color, texture, turgor are normal, no rashes or significant lesions EYES: normal, Conjunctiva are pink and non-injected, sclera clear OROPHARYNX:no exudate, no erythema and lips, buccal mucosa, and tongue normal  LUNGS: clear to auscultation and percussion with normal breathing effort HEART: Slightly tachycardic, 1-2+ bilateral lower extremity edema ABDOMEN: Abdomen distended, some tenderness to the right upper quadrant, hypoactive bowel sounds NEURO: alert, no focal motor/sensory deficits  LABORATORY DATA:  I have reviewed the data as listed CMP Latest Ref Rng & Units 08/02/2021 08/01/2021 07/31/2021  Glucose 70 - 99 mg/dL 120(H) 138(H) 122(H)  BUN 6 - 20 mg/dL 77(H) 62(H) 46(H)  Creatinine 0.44 - 1.00 mg/dL 0.51 0.45 0.37(L)  Sodium 135 - 145 mmol/L 140 141 140  Potassium 3.5 - 5.1 mmol/L 4.0 3.9 3.6  Chloride 98 - 111 mmol/L 111 112(H) 109  CO2 22 - 32 mmol/L 23 23 23   Calcium 8.9 - 10.3 mg/dL  8.2(L) 8.0(L) 7.9(L)  Total Protein 6.5 - 8.1 g/dL 6.1(L) 5.9(L) 5.7(L)  Total Bilirubin 0.3 - 1.2 mg/dL 1.0 1.1 0.6  Alkaline Phos 38 - 126 U/L 56 41 52  AST 15 - 41 U/L 14(L) 14(L) 18  ALT 0 - 44 U/L 11 10 11     Lab Results  Component Value Date   WBC 4.2 08/02/2021   HGB 8.9 (L) 08/02/2021   HCT 28.8 (L) 08/02/2021   MCV 87.0 08/02/2021   PLT 196 08/02/2021   NEUTROABS 3.3  08/02/2021    CT ABDOMEN PELVIS WO CONTRAST  Result Date: 07/24/2021 CLINICAL DATA:  Abdominal distension. Metastatic pancreatic carcinoma. IV contrast allergy. Vomiting and diarrhea. EXAM: CT ABDOMEN AND PELVIS WITHOUT CONTRAST TECHNIQUE: Multidetector CT imaging of the abdomen and pelvis was performed following the standard protocol without IV contrast. COMPARISON:  07/04/2021 FINDINGS: Lower chest: Atelectasis in the left lower lobe, right middle lobe, and right lower lobe. Large right pleural effusion. Small stable pericardial effusion. Central catheter tip terminates in the right atrium. Anterior pericardial node 0.6 cm in short axis on image 8 series 2. Hepatobiliary: Heterogeneity in the liver probably reflecting metastatic lesions. Nonvisualization of the gallbladder, presumed surgically absent. Pancreas: Solid mass of the pancreatic tail 4.6 by 2.9 cm, roughly stable. Spleen: Unremarkable Adrenals/Urinary Tract: Punctate bilateral nonobstructive renal calculi. Adrenal glands unremarkable. Stomach/Bowel: Frothy fluid levels in the distal colon and rectum with diarrheal process. Dilated colon down to the level of the sigmoid, there is some adjacent nodularity but a definite cause for the transition is not observed no substantially dilated small bowel currently. Vascular/Lymphatic: Patency not assessed due to lack of IV contrast. No periaortic adenopathy is identified. There appears to be a trace amount of gas in a likely venous structure tracking along the anterior omentum on image 41 series 2, this could be related to gas from an IV as I do not demonstrate other classic hallmarks of portal venous gas along more characteristic locations for example in the mesenteric bowel. Reproductive: Uterus appears mildly flattened anteriorly, with large conglomerate pelvic cystic mass involving both adnexa measuring about 15.6 by 11.6 cm on image 66 of series 2, roughly similar to prior. Other: Substantial ascites  similar to prior with mesenteric edema and extensive scattered nodularity along the mesentery and omentum compatible peritoneal tumor. 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 IMPRESSION: 1. Trace gas in tiny venous structures to the left of the stomach and also along the anterior omentum. Although not a typical distribution for portal venous gas, there is also dilated colon up to the level of the sigmoid colon (without visible pneumatosis). Correlate with lactate levels in assessing for the possibility of ischemic bowel. 2. Generally similar appearance of large right effusion, substantial ascites, extensive peritoneal metastatic disease, large cystic pelvic masses, pancreatic tail tumor, and some heterogeneity in the liver suspicious for hepatic metastatic lesions. 3. Bilateral punctate nonobstructive nephrolithiasis. 4. Atelectasis in the left lower lobe, right middle lobe, and right lower lobe. 5. Stable small pericardial effusion. Critical Value/emergent results were called by telephone at the time of interpretation on 07/24/2021 at 6:45 pm to provider Dr. Verlee Monte, who verbally acknowledged these results. Electronically Signed   By: Van Clines M.D.   On: 07/24/2021 18:46   CT ABDOMEN PELVIS WO CONTRAST  Result Date: 06/20/2021 CLINICAL DATA:  History of pancreatic cancer.  Abdominal distention. EXAM: CT ABDOMEN AND PELVIS WITHOUT CONTRAST TECHNIQUE: Multidetector CT imaging of the  abdomen and pelvis was performed following the standard protocol without IV contrast. COMPARISON:  April 21, 2021. FINDINGS: Lower chest: Increased right pleural effusion is noted with associated atelectasis of the right lower lobe. Hepatobiliary: Heterogeneous appearance of hepatic parenchyma is noted suggesting diffuse metastatic disease as noted on prior exam. Some degree of hepatic steatosis may be present as well. Status post cholecystectomy. No definite biliary  dilatation is noted. Pancreas: Solid mass is seen involving pancreatic tail measuring 4.8 x 2.6 cm consistent with history of pancreatic adenocarcinoma. No definite ductal dilatation is noted. Spleen: Normal in size without focal abnormality. Adrenals/Urinary Tract: Adrenal glands appear normal. Nonobstructive left nephrolithiasis is noted. No hydronephrosis or renal obstruction is noted. Urinary bladder is decompressed. Stomach/Bowel: The stomach appears normal. There is no evidence of bowel obstruction or inflammation. Vascular/Lymphatic: No significant vascular findings are present. No enlarged abdominal or pelvic lymph nodes. Reproductive: Uterus is unremarkable. 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 These are significantly enlarged compared to prior exam. Overall they measure 15.5 x 11.0 cm, and it is difficult to distinguish right from left. Other: There is interval development of mild to moderate ascites in the pelvis and around the liver and spleen. 7.9 x 5.0 cm mass is seen along the anterior abdominal wall in the region of the umbilicus which is enlarged compared to prior exam and consistent with worsening peritoneal implant. Musculoskeletal: Grade 1 anterolisthesis of L4-5 is noted secondary to bilateral L5 spondylolysis. No acute osseous abnormality is noted. IMPRESSION: 4.8 x 2.6 cm solid discrete mass is seen involving pancreatic tail consistent with history of pancreatic adenocarcinoma. Mild to moderate ascites is noted both in the pelvis and around the liver and spleen in the upper abdomen. 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 This is significantly enlarged compared to prior exam. Significantly enlarged peritoneal implant is seen along the anterior abdominal wall in the periumbilical region consistent with  peritoneal carcinomatosis. Heterogeneous appearance of hepatic parenchyma is noted consistent with multiple metastatic lesions. Some degree of hepatic steatosis may be present as well. Increased right pleural effusion is noted with associated atelectasis of the right lower lobe. Electronically Signed   By: Marijo Conception M.D.   On: 07/01/2021 14:56   DG Chest 1 View  Result Date: 07/24/2021 CLINICAL DATA:  Abdominal pain, nausea and vomiting, pancreatic cancer EXAM: CHEST  1 VIEW COMPARISON:  07/08/2021 chest radiograph. FINDINGS: Right internal jugular Port-A-Cath terminates over the right atrium, stable. Stable cardiomediastinal silhouette with normal heart size. No pneumothorax. Small to moderate right and small left pleural effusions, slightly increased. No overt pulmonary edema. Hazy bibasilar lung opacities, mildly worsened, favor atelectasis. IMPRESSION: Small to moderate right and small left pleural effusions, slightly increased. Hazy bibasilar lung opacities, mildly worsened, favor atelectasis. Electronically Signed   By: Ilona Sorrel M.D.   On: 07/24/2021 11:26   DG Chest 2 View  Result Date: 07/09/2021 CLINICAL DATA:  Suspected sepsis EXAM: CHEST - 2 VIEW COMPARISON:  04/08/2020 FINDINGS: Low volume examination. Small right pleural effusion associated atelectasis or consolidation. The left lung is normally aerated. Cardiomegaly. Right chest port catheter. IMPRESSION: Low volume examination. Small right pleural effusion and associated atelectasis or consolidation. Electronically Signed   By: Delanna Ahmadi M.D.   On: 06/29/2021 14:37   DG Cervical Spine 2 or 3 views  Result Date: 07/21/2021  CLINICAL DATA:  Pancreatic cancer with metastases. EXAM: CERVICAL SPINE - 2-3 VIEW COMPARISON:  None. FINDINGS: Lateral view includes skull base through mid C6. There is straightening of normal cervical lordosis. There is no evidence for subluxation. Disc spaces are maintained. There is no acute fracture.  No focal osseous lesions are identified. There is no prevertebral soft tissue swelling. Catheter is partially visualized overlying the right chest. IMPRESSION: Negative cervical spine radiographs. Electronically Signed   By: Ronney Asters M.D.   On: 07/21/2021 16:18   DG Abd 1 View  Result Date: 08/01/2021 CLINICAL DATA:  55 year old female with bowel obstruction. EXAM: ABDOMEN - 1 VIEW COMPARISON:  07/24/2021, 07/29/2021, 07/30/2021 FINDINGS: Similar appearing gaseous distension of the transverse colon measuring up to approximately 9 cm near the hepatic flexure. Similar appearing of gastric decompression tube with the tip in the stomach. No significant small bowel gaseous distension. Relative paucity of distal colonic gas. IMPRESSION: Similar appearing colonic obstruction versus ileus. Electronically Signed   By: Ruthann Cancer M.D.   On: 08/01/2021 09:46   DG Abd 1 View  Result Date: 07/30/2021 CLINICAL DATA:  Follow-up ileus EXAM: ABDOMEN - 1 VIEW COMPARISON:  Abdominal x-ray 07/29/2021 FINDINGS: Enteric tube tip is in the stomach. Persistent distended loops of bowel across the mid abdomen which measure up to approximately 9.2 cm in diameter, mildly improved since previous study. No suspicious calcifications visualized. IMPRESSION: Persistent but mildly improved distended loops of bowel. Electronically Signed   By: Ofilia Neas M.D.   On: 07/30/2021 09:11   DG Abd 1 View  Result Date: 07/29/2021 CLINICAL DATA:  Follow-up ileus EXAM: ABDOMEN - 1 VIEW COMPARISON:  Multiple prior radiographs including most recent dated July 27, 2021 from FINDINGS: NG tube with distal tip overlying the stomach. Gaseous dilated colonic loop in the right abdomen measuring up to 11 cm, unchanged. There also dilated bowel lobes in the mid abdomen and left lower quadrant. IMPRESSION: 1. No significant interval change and multiple distended bowel loops concerning for ileus/obstruction. 2. NG tube with side port and  distal tip overlying the gastric body. Electronically Signed   By: Keane Police D.O.   On: 07/29/2021 09:12   DG Abd 1 View  Result Date: 07/27/2021 CLINICAL DATA:  NG tube placement EXAM: ABDOMEN - 1 VIEW COMPARISON:  None. FINDINGS: Nasogastric tube with the tip projecting over the stomach. Gaseous distension the small bowel and colon. No evidence of pneumoperitoneum, portal venous gas or pneumatosis. No pathologic calcifications along the expected course of the ureters. No acute osseous abnormality. IMPRESSION: Nasogastric tube with the tip projecting over the stomach. Electronically Signed   By: Kathreen Devoid M.D.   On: 07/27/2021 11:22   DG Abd 1 View  Result Date: 07/27/2021 CLINICAL DATA:  Abdominal distention, vomiting EXAM: ABDOMEN - 1 VIEW COMPARISON:  07/26/2021 FINDINGS: There are dilated bowel loops in the mid abdomen measuring up to 11.5 cm in maximum diameter. This finding is not changed significantly. There is haziness in the abdomen and pelvis, possibly suggesting ascites. IMPRESSION: There is dilation of bowel loops in mid abdomen suggesting ileus or partial obstruction. No significant interval changes are noted. Electronically Signed   By: Elmer Picker M.D.   On: 07/27/2021 08:06   DG Abd 1 View  Result Date: 07/25/2021 CLINICAL DATA:  Check gastric catheter placement EXAM: ABDOMEN - 1 VIEW COMPARISON:  None. FINDINGS: Gastric catheter is noted within the stomach. Scattered large and small bowel gas is noted similar to that  seen on the prior exam. No free air is seen. IMPRESSION: Gastric catheter in the stomach. Findings suggestive of ileus. Electronically Signed   By: Inez Catalina M.D.   On: 07/25/2021 00:00   DG Abd 1 View  Result Date: 07/24/2021 CLINICAL DATA:  Abdominal pain, nausea and vomiting EXAM: ABDOMEN - 1 VIEW COMPARISON:  07/21/2021 abdominal radiographs FINDINGS: No disproportionately dilated small bowel loops. Prominent gaseous distension of the colon, most  prominent in the transverse colon, worsened. No evidence of pneumatosis or pneumoperitoneum. No radiopaque nephrolithiasis. IMPRESSION: Prominent gaseous distension of the colon, most prominent in the transverse colon, worsened. Findings are suggestive of colonic ileus. No disproportionately dilated small bowel loops to suggest small bowel obstruction. Electronically Signed   By: Ilona Sorrel M.D.   On: 07/24/2021 11:25   DG Abd 1 View  Result Date: 07/21/2021 CLINICAL DATA:  Distended abdomen. EXAM: ABDOMEN - 1 VIEW COMPARISON:  None. FINDINGS: The bowel gas pattern is normal. There is a calcification in the left hemipelvis, likely a phlebolith. No acute fractures are seen. IMPRESSION: Negative. Electronically Signed   By: Ronney Asters M.D.   On: 07/21/2021 16:58   US Venous Img Lower Bilateral (DVT)  Result Date: 07/16/2021 CLINICAL DATA:  Bilateral lower extremity edema and history of pancreatic cancer. EXAM: BILATERAL LOWER EXTREMITY VENOUS DOPPLER ULTRASOUND TECHNIQUE: Gray-scale sonography with graded compression, as well as color Doppler and duplex ultrasound were performed to evaluate the lower extremity deep venous systems from the level of the common femoral vein and including the common femoral, femoral, profunda femoral, popliteal and calf veins including the posterior tibial, peroneal and gastrocnemius veins when visible. The superficial great saphenous vein was also interrogated. Spectral Doppler was utilized to evaluate flow at rest and with distal augmentation maneuvers in the common femoral, femoral and popliteal veins. COMPARISON:  None. FINDINGS: RIGHT LOWER EXTREMITY Common Femoral Vein: No evidence of thrombus. Normal compressibility, respiratory phasicity and response to augmentation. Saphenofemoral Junction: No evidence of thrombus. Normal compressibility and flow on color Doppler imaging. Profunda Femoral Vein: No evidence of thrombus. Normal compressibility and flow on color  Doppler imaging. Femoral Vein: No evidence of thrombus. Normal compressibility, respiratory phasicity and response to augmentation. Popliteal Vein: No evidence of thrombus. Normal compressibility, respiratory phasicity and response to augmentation. Calf Veins: No evidence of thrombus. Normal compressibility and flow on color Doppler imaging. Superficial Great Saphenous Vein: No evidence of thrombus. Normal compressibility. Venous Reflux:  None. Other Findings: Baker's cyst within the popliteal fossa measures 3.8 by 1.1 x 2.3 cm. LEFT LOWER EXTREMITY Common Femoral Vein: No evidence of thrombus. Normal compressibility, respiratory phasicity and response to augmentation. Saphenofemoral Junction: No evidence of thrombus. Normal compressibility and flow on color Doppler imaging. Profunda Femoral Vein: No evidence of thrombus. Normal compressibility and flow on color Doppler imaging. Femoral Vein: No evidence of thrombus. Normal compressibility, respiratory phasicity and response to augmentation. Popliteal Vein: No evidence of thrombus. Normal compressibility, respiratory phasicity and response to augmentation. Calf Veins: No evidence of thrombus. Normal compressibility and flow on color Doppler imaging. Superficial Great Saphenous Vein: No evidence of thrombus. Normal compressibility. Venous Reflux:  None. Other Findings: Complex fluid collection within the popliteal fossa measures 3.4 x 1.2 x 1.7 cm containing diffuse internal echoes and areas of calcification. IMPRESSION: 1. No evidence of deep venous thrombosis in either lower extremity. 2. Right-sided Baker's cyst. 3. Complex fluid collection within the left popliteal fossa containing internal echoes and calcification. Favor complex Baker's cyst with internal  debris and calcification. Electronically Signed   By: Kerby Moors M.D.   On: 06/25/2021 15:04   US Paracentesis  Result Date: 07/26/2021 INDICATION: Patient with history of pancreatic cancer, recurrent  ascites. Request to IR for diagnostic and therapeutic paracentesis. EXAM: ULTRASOUND GUIDED DIAGNOSTIC AND THERAPEUTIC PARACENTESIS MEDICATIONS: 10 mL 1% lidocaine COMPLICATIONS: None immediate. PROCEDURE: Informed written consent was obtained from the patient after a discussion of the risks, benefits and alternatives to treatment. A timeout was performed prior to the initiation of the procedure. Initial ultrasound scanning demonstrates a moderate amount of ascites within the left lower abdominal quadrant. The left lower abdomen was prepped and draped in the usual sterile fashion. 1% lidocaine was used for local anesthesia. Following this, a 19 gauge, 7-cm, Yueh catheter was introduced. An ultrasound image was saved for documentation purposes. The paracentesis was performed. The catheter was removed and a dressing was applied. The patient tolerated the procedure well without immediate post procedural complication. FINDINGS: A total of approximately 2.9 L of hazy yellow fluid was removed. Samples were sent to the laboratory as requested by the clinical team. IMPRESSION: Successful ultrasound-guided paracentesis yielding 2.9 liters of peritoneal fluid. Read by Candiss Norse, PA-C Electronically Signed   By: Michaelle Birks M.D.   On: 07/26/2021 18:32   US Paracentesis  Result Date: 07/17/2021 INDICATION: Patient with history of pancreatic cancer, recurrent ascites. Request received for therapeutic paracentesis. EXAM: ULTRASOUND GUIDED THERAPEUTIC PARACENTESIS MEDICATIONS: 10 mL 1% lidocaine COMPLICATIONS: None immediate. PROCEDURE: Informed written consent was obtained from the patient after a discussion of the risks, benefits and alternatives to treatment. A timeout was performed prior to the initiation of the procedure. Initial ultrasound scanning demonstrates a moderate amount of ascites within the left upper abdominal quadrant. The left upper abdomen was prepped and draped in the usual sterile fashion. 1%  lidocaine was used for local anesthesia. Following this, a 19 gauge, 10-cm, Yueh catheter was introduced. An ultrasound image was saved for documentation purposes. The paracentesis was performed. The catheter was removed and a dressing was applied. The patient tolerated the procedure well without immediate post procedural complication. FINDINGS: A total of approximately 3.4 liters of yellow fluid was removed. IMPRESSION: Successful ultrasound-guided therapeutic paracentesis yielding 3.4 liters of peritoneal fluid. Read by: Rowe Robert, PA-C Electronically Signed   By: Jacqulynn Cadet M.D.   On: 07/17/2021 09:37   US Paracentesis  Result Date: 07/09/2021 INDICATION: History of pancreatic cancer. Abdominal discomfort with ascites. Request for therapeutic paracentesis. EXAM: ULTRASOUND GUIDED LEFT LOWER QUADRANT PARACENTESIS MEDICATIONS: 1% plain lidocaine, 5 mL COMPLICATIONS: None immediate. PROCEDURE: Informed written consent was obtained from the patient after a discussion of the risks, benefits and alternatives to treatment. A timeout was performed prior to the initiation of the procedure. Initial ultrasound scanning demonstrates a moderate amount of ascites within the left lower abdominal quadrant. Ascites is noted to be partially loculated. The left lower abdomen was prepped and draped in the usual sterile fashion. 1% lidocaine was used for local anesthesia. Following this, a 19 gauge, 10-cm, Yueh catheter was introduced. An ultrasound image was saved for documentation purposes. The paracentesis was performed. The catheter was removed and a dressing was applied. The patient tolerated the procedure well without immediate post procedural complication. FINDINGS: A total of approximately 2.8 L of clear yellow fluid was removed. IMPRESSION: Successful ultrasound-guided paracentesis yielding 2.8 liters of peritoneal fluid. Read by: Ascencion Dike PA-C Electronically Signed   By: Sandi Mariscal M.D.   On: 07/09/2021  16:09   DG CHEST PORT 1 VIEW  Result Date: 07/27/2021 CLINICAL DATA:  Pleural effusion. Additional history provided: Pleural effusions status post thoracentesis. EXAM: PORTABLE CHEST 1 VIEW COMPARISON:  Prior chest radiographs 07/24/2021 and earlier. FINDINGS: Right chest infusion port catheter with tip projecting at the level of the superior cavoatrial junction. An enteric tube is present, extending below the level of the left hemidiaphragm with tip excluded from the field of view. The cardiomediastinal silhouette is unchanged. No significant right pleural effusion is appreciated status post thoracentesis. A small left pleural effusion is unchanged. Mild bibasilar atelectasis. No evidence of pneumothorax. No acute bony abnormality identified. IMPRESSION: No significant residual right pleural effusion is appreciated status post thoracentesis. No evidence of pneumothorax. Persistent small left pleural effusion. Mild bibasilar atelectasis. Electronically Signed   By: Kellie Simmering D.O.   On: 07/27/2021 13:54   DG Abd Portable 1V  Result Date: 07/26/2021 CLINICAL DATA:  Follow-up ileus EXAM: PORTABLE ABDOMEN - 1 VIEW COMPARISON:  Abdominal x-ray 07/24/2021 FINDINGS: Enteric tube tip is in the stomach. Redemonstration of abnormally distended gas-filled loops of bowel mostly in the mid abdomen which measure up to 8.4 cm in diameter and appear mildly increased diameter since previous study. Paucity of bowel gas in the pelvis. No suspicious calcifications visualized. IMPRESSION: Mildly increased distension of abnormal gas-filled loops of bowel in the mid abdomen since previous study. May represent obstruction or ileus. Electronically Signed   By: Ofilia Neas M.D.   On: 07/26/2021 08:30   [place] ASCITES (ABDOMEN LIMITED)  Result Date: 07/21/2021 CLINICAL DATA:  55 year old female with history of pancreatic cancer and ascites. EXAM: LIMITED ABDOMEN ULTRASOUND FOR ASCITES TECHNIQUE: Limited ultrasound survey for  ascites was performed in all four abdominal quadrants. COMPARISON:  07/16/2021 FINDINGS: Trace ascites in the right upper, left upper, and right lower quadrants. No significant ascites in the left lower quadrant. IMPRESSION: Trace ascites.  No safe window with for paracentesis at this time. Ruthann Cancer, MD [facility] Electronically Signed   By: Ruthann Cancer M.D.   On: 07/21/2021 16:10   VAS [place] UPPER EXTREMITY VENOUS DUPLEX  Result Date: 07/20/2021 UPPER VENOUS STUDY  Patient Name:  ALANNA STORTI  Date of Exam:   07/20/2021 Medical Rec #: [id number]          Accession #:    [id number] Date of Birth: [birth year]           Patient Gender: F Patient Age:   80 years Exam Location:  [facility] Procedure:      VAS [place] UPPER EXTREMITY VENOUS DUPLEX Referring Phys: Burney Gauze --------------------------------------------------------------------------------  Indications: Pain Risk Factors: Cancer. Comparison Study: 04/20/2021 - R IJV, and Innominate vein DVT Performing Technologist: Oliver Hum RVT  Examination Guidelines: A complete evaluation includes B-mode imaging, spectral Doppler, color Doppler, and power Doppler as needed of all accessible portions of each vessel. Bilateral testing is considered an integral part of a complete examination. Limited examinations for reoccurring indications may be performed as noted.  Right Findings: +----------+------------+---------+-----------+----------+-------+ RIGHT     CompressiblePhasicitySpontaneousPropertiesSummary +----------+------------+---------+-----------+----------+-------+ IJV           None       No        No               Chronic +----------+------------+---------+-----------+----------+-------+ Subclavian    Full       Yes       Yes                      +----------+------------+---------+-----------+----------+-------+  Axillary      Full       Yes       Yes                       +----------+------------+---------+-----------+----------+-------+ Brachial      Full       Yes       Yes                      +----------+------------+---------+-----------+----------+-------+ Radial        Full                                          +----------+------------+---------+-----------+----------+-------+ Ulnar         Full                                          +----------+------------+---------+-----------+----------+-------+ Cephalic      Full                                          +----------+------------+---------+-----------+----------+-------+ Basilic       Full                                          +----------+------------+---------+-----------+----------+-------+  Left Findings: +----------+------------+---------+-----------+----------+-------+ LEFT      CompressiblePhasicitySpontaneousPropertiesSummary +----------+------------+---------+-----------+----------+-------+ Subclavian    Full       Yes       Yes                      +----------+------------+---------+-----------+----------+-------+  Summary:  Right: No evidence of superficial vein thrombosis in the upper extremity. 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  Left: No evidence of thrombosis in the subclavian.  *See table(s) above for measurements and observations.  Diagnosing physician: Deitra Mayo MD Electronically signed by Deitra Mayo MD on 07/20/2021 at 5:08:16 PM.    Final    ECHOCARDIOGRAM LIMITED  Result Date: 07/23/2021    ECHOCARDIOGRAM LIMITED REPORT   Patient Name:   CHAVIE KOLINSKI Date of Exam: 07/23/2021 Medical Rec #:  [id number]         Height:       66.0 in Accession #:    [id number]        Weight:       211.0 lb Date of Birth:  11/07/1965          BSA:          2.046 m Patient Age:    14 years          BP:           127/83 mmHg Patient Gender: F                 HR:            113 bpm. Exam Location:  Inpatient Procedure: Limited Echo, Color Doppler and Cardiac Doppler Indications:    R94.31 Abnormal EKG  History:        Patient has no prior history of  Echocardiogram examinations.                 Risk Factors:Hypertension, Diabetes, Dyslipidemia and Stage IV                 Pancreatic Cancer.  Sonographer:    Raquel Sarna Senior RDCS Referring Phys: [id number] Noland Fordyce MATHEWS  Sonographer Comments: Technically difficult study due to poor echo windows, suboptimal parasternal window, suboptimal apical window and suboptimal subcostal window. IMPRESSIONS  1. Limited study with very difficult windows for TTE. Grossly LV function appears preserved 60-65% but cannot assess for WMA. RV not well visualized.  2. Left ventricular ejection fraction, by estimation, is 60 to 65%. The left ventricle has normal function. Left ventricular endocardial border not optimally defined to evaluate regional wall motion.  3. Right ventricular systolic function was not well visualized. The right ventricular size is not well visualized. There is normal pulmonary artery systolic pressure.  4. The mitral valve was not well visualized.  5. The aortic valve was not well visualized.  6. The inferior vena cava is normal in size with <50% respiratory variability, suggesting right atrial pressure of 8 mmHg. FINDINGS  Left Ventricle: Left ventricular ejection fraction, by estimation, is 60 to 65%. The left ventricle has normal function. Left ventricular endocardial border not optimally defined to evaluate regional wall motion. Right Ventricle: The right ventricular size is not well visualized. Right vetricular wall thickness was not well visualized. Right ventricular systolic function was not well visualized. There is normal pulmonary artery systolic pressure. The tricuspid regurgitant velocity is 2.57 m/s, and with an assumed right atrial pressure of 8 mmHg, the estimated right ventricular systolic pressure is 69.4 mmHg. Left  Atrium: Left atrial size was not well visualized. Right Atrium: Right atrial size was not well visualized. Pericardium: There is no evidence of pericardial effusion. Presence of pericardial fat pad. Mitral Valve: The mitral valve was not well visualized. Tricuspid Valve: The tricuspid valve is grossly normal. Tricuspid valve regurgitation is mild . No evidence of tricuspid stenosis. Aortic Valve: The aortic valve was not well visualized. Pulmonic Valve: The pulmonic valve was not well visualized. Pulmonic valve regurgitation is not visualized. No evidence of pulmonic stenosis. Aorta: The aortic root is normal in size and structure. Venous: The inferior vena cava is normal in size with less than 50% respiratory variability, suggesting right atrial pressure of 8 mmHg. AORTIC VALVE LVOT Vmax:   87.40 cm/s LVOT Vmean:  57.400 cm/s LVOT VTI:    0.127 m TRICUSPID VALVE TR Peak grad:   26.4 mmHg TR Vmax:        257.00 cm/s  SHUNTS Systemic VTI: 0.13 m Eleonore Chiquito MD Electronically signed by Eleonore Chiquito MD Signature Date/Time: 07/23/2021/1:48:15 PM    Final    US THORACENTESIS ASP PLEURAL SPACE W/IMG GUIDE  Result Date: 07/27/2021 INDICATION: History of pancreatic cancer, shortness of breast, pleural effusion seen on previous chest x-ray. Request for therapeutic and diagnostic thoracentesis. EXAM: ULTRASOUND GUIDED RIGHT THORACENTESIS MEDICATIONS: 10 mL 1% lidocaine COMPLICATIONS: None immediate. PROCEDURE: An ultrasound guided thoracentesis was thoroughly discussed with the patient and questions answered. The benefits, risks, alternatives and complications were also discussed. The patient understands and wishes to proceed with the procedure. Written consent was obtained. Ultrasound was performed to localize and mark an adequate pocket of fluid in the right chest. The area was then prepped and draped in the normal sterile fashion. 1% Lidocaine was used for local anesthesia. Under ultrasound guidance a 6 Fr  Safe-T-Centesis catheter was introduced. Thoracentesis was performed. The catheter was removed and a dressing applied. FINDINGS: A total of approximately 600 cc of hazy amber fluid was removed. Samples were sent to the laboratory as requested by the clinical team. Post procedure chest X-ray reviewed, negative for pneumothorax. IMPRESSION: Successful ultrasound guided right thoracentesis yielding 600 cc of pleural fluid. Read by: Durenda Guthrie, PA-C Electronically Signed   By: Ruthann Cancer M.D.   On: 07/27/2021 14:52    ASSESSMENT AND PLAN: 1.  Metastatic pancreatic adenocarcinoma 2.  Malignant ascites 3.  Lower extremity edema 4.  Acute respiratory failure with hypoxia 5.  Right pleural effusion 6.  Right IJ thrombus 7.  Hyponatremia 8.  Type 2 diabetes mellitus 9.  Hypertension 10.  Colonic ileus 11.  Protein calorie malnutrition 12.  Anemia due to chronic disease  Ms. Herbold appears stable.  Per hospitalist, she is actually more alert today.  Her NG tube came out overnight and she has not had any recurrent vomiting.  Would recommend keeping this out when she develops recurrent nausea and vomiting.  She is currently receiving TPN for nutrition.  The patient has metastatic pancreatic cancer has had multiple lines of therapy.  We have discussed the incurable nature of her disease and declining performance status.  At this point, she is not a candidate for additional chemotherapy.  We have discussed CODE STATUS and shifting focus to comfort measures and referral to hospice.  The patient and her family wish for her to remain a full code and want to continue aggressive measures.  Daughter requesting for tumor markers to be rechecked.  I have called the lab and added on a CA125 and CEA to this morning's labs.  These are the tumor markers that have been followed by Dr. Marin Olp.  Recommendations: 1.  No need to replace NG tube unless she develops recurrent nausea and vomiting. 2.  Continue TPN. 3.   Paracentesis as needed based on patient's symptoms. 4.  We will check CA125 and CEA. 5.  Palliative care continues to follow.  No future appointments.    LOS: 18 days   Mikey Bussing, , AGPCNP-BC, AOCNP 08/02/21 Ms. Otilio Jefferson was interviewed and examined.  Her daughter was at the bedside.  I discussed the poor prognosis with her daughter.  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  Her daughter reports the patient and family have decided against hospice care.  She would like to continue current interventions and requested we check a "tumor marker ".  I was present for greater than 50% of today's visit.  I performed medical decision making.

## 2021-08-02 NOTE — Progress Notes (Signed)
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  She has undergone extensive treatment since May 2021.  Her cancer seems to have progressed since then.    Events in the last 7 days-patient continues to be tachycardic with a resting heart rate in the 130s to 140s with severe deconditioning and increasing in heart rate with any activity.  Her nausea and vomiting had subsided initially however she had not had any bowel movement so she was given MiraLAX Dulcolax and lactulose for 1 day.  She started having diarrhea and had large bowel movements and a rectal tube was placed at that point.  All the laxatives were stopped.  She received laxatives only 1 day.  Then she started to have intractable nausea vomiting NG tube was placed and she was moved to stepdown unit.  Stat CT of the abdomen and pelvis without contrast(she has severe contrast allergy) possible ischemic bowel and colonic ileus.  PCCM and general surgery was consulted. When she was moved to stepdown unit her respiratory rate was in the 40s and her heart rate was in the 150s. Dr. Barry Dienes saw the patient she did not think patient has ischemic bowel and even if she did have ischemic bowel she was not a candidate for surgery due to peritoneal carcinomatosis.  Her lactic acid was normal. PCCM followed the patient over the weekend and signed off on 07/25/2021. Patient also developed increasing right pleural effusion and increasing ascites. She received albumin infusion for 6 doses. Xarelto was stopped and heparin was started as there was some blood-tinged urine as well as NG tube  drainage.  And she was n.p.o. due to colonic ileus and nausea vomiting with NG tube in place. Her medications were narrowed down on 07/23/2021 to include only the ones she really needs it and was able to be converted to IV.   07/26/2021 2.9 L of fluid taken out paracentesis 07/27/2021 600 cc of hazy amber-colored right thoracentesis fluid sent for studies Patient started on TPN Heparin stopped Lovenox started Patient pulled out NG tube on 07/26/2021 this was replaced on 07/27/2021 as she again started with bilious vomiting   Daughter involved very involved with patient's care and patient remains full code in spite of multiple consultants PCCM oncology palliative care General surgery and hospitalist team explaining to her that her prognosis is extremely poor prognosis.   Assessment & Plan:   Principal Problem:   Acute respiratory failure with hypoxia (HCC) Active Problems:   Type 2 diabetes mellitus (HCC)   Stage IV adenocarcinoma of pancreas (HCC)   Essential hypertension, benign   Abdominal ascites   Pleural effusion on right   Hyponatremia   Internal jugular (IJ) vein thromboembolism, chronic, right (HCC)   Goals of care, counseling/discussion   Intravascular volume depletion   Malnutrition of moderate degree   Ileus (HCC)   Hypokalemia   Hypophosphatemia   Hypomagnesemia   DVT (deep venous thrombosis) (HCC)   Anemia  #1 acute hypoxemic respiratory failure secondary to large right pleural effusion status postthoracentesis 07/27/2021 -Improved.  Also in part secondary to poor diaphragmatic excursion with massive abdominal distention. -NG tube placed, status post  thoracenteses. -Currently with sats of 100% on room air.  2.  Stage IV adenocarcinoma of the pancreas diagnosed 2021 with mets to the peritoneum, liver. -Patient undergone multiple rounds of chemotherapy and radiation. -Patient with progression of disease. -Patient being followed by oncology Dr. Marin Olp and patient deemed  not a candidate for any chemotherapy at this time. -CT abdomen 07/24/2021-large right effusion substantial ascites extensive peritoneal metastatic disease large cystic pelvic masses pancreatic tail tumor and heterogenicity in the liver suspicious for hepatic metastatic lesions atelectasis in the left lower lobe right middle lobe and right lower lobe stable small pericardial effusion. -Patient status post paracentesis and thoracentesis. -Patient noted to have pulled out NG tube 07/26/2021 and KUB done with worsening colonic ileus and as such NG tube placed back in as patient did have ongoing nausea and emesis. -No significant change with colonic ileus. -Patient noted to have pulled out NG tube overnight 08/01/2021, no further nausea or emesis. -Hold off on replacing tube at this time unless patient develops significant nausea or emesis. -Patient with very poor prognosis.   -Palliative care consulted however due to religious believes patient's daughter believes patient is going to be healed and recommended ongoing aggressive treatment at this time.  3.  Malignant ascites -Status post paracentesis 07/26/2021 with cytology consistent with malignant cells consistent with metastatic adenocarcinoma. -2.9 L of fluid removed. -Oncology following  4.  Sinus tachycardia -Likely multifocal secondary to acute illnesses including progressive stage IV adenocarcinoma of the pancreas, colonic ileus, pleural effusion. -2D echo done with normal EF. -Tachycardia improved on current dose of IV Lopressor. -Follow.  5.  Right IJ thrombus -Continue full dose Lovenox. -Per heme/unk.  6.  Fever -Likely tumor fever. -Fever curve trending down. -Status post course of cefepime and Flagyl. -No further antibiotics needed.  7.  Colonic ileus -Likely secondary to stage IV adenocarcinoma of the pancreas with peritoneal mets in the setting of opioid pain medication. -Patient noted to have pulled out NG tube 07/26/2021  with KUB showing worsening colonic ileus. -NG tube placed back in 07/27/2021 with a output of 200 cc over the past 24 hours. -Rectal tube discontinued. -Patient very weak making mobility difficult.   -Repeat KUB done 07/30/2021, with persistent but mildly improved distended loops of bowel noted.   -KUB with similar appearing colonic obstruction versus ileus.  -Patient with hypoactive bowel sounds . -Patient noted to have pulled out NG tube overnight. -Hold off on placing NG tube back in unless patient has ongoing nausea and vomiting. -No nausea or emesis. -Minimize narcotics. -Continue simethicone 4 times daily. -PT/OT, mobilize as able to. -Patient seen in consultation by GI who feel patient is not a candidate for colonoscopy and decompression and anesthesia, not a candidate for neostigmine -peritoneal carcinomatosis, risk of arrhythmia, bradycardia, cardiac arrest, respiratory arrest, respiratory depression.  Per GI patient with very poor prognosis.  -Goal to keep potassium approximately at 4, magnesium approximately at 2. -Continue current supportive care.   8.  Hypokalemia/hypophosphatemia/hypomagnesemia -Patient on TPN, being repleted per pharmacy. -Potassium at 4 today, phosphorus at 4.3, magnesium at 2.3. -Goal is to keep potassium approximately 4, magnesium at 2. -Electrolytes being managed per pharmacy.  9.  Moderate protein calorie malnutrition -On TPN. -Thiamine added to TPN per RD recommendations due to concern for refeeding syndrome. -Status post IV albumin every 6 hours x1 day (07/31/2021). -Albumin at 3.0 today. -Follow.  10.  Hyperglycemia -Likely secondary to TPN -CBG 113 this morning.   -Insulin being adjusted in TPN  per pharmacy.   11.  Anemia -Likely secondary to stage IV pancreatic adenocarcinoma and recent chemotherapy. -Patient with no overt bleeding. -NG tube nonbloody, nonbilious. -Anemia panel consistent with anemia of chronic disease.  Iron level of  19.  Folate of 16.8.   -Status post transfusion of 1 unit packed red blood cells 08/01/2021 with hemoglobin currently at 8.9.   -Follow H&H.   -Transfusion threshold hemoglobin < 7.    DVT prophylaxis: Lovenox Code Status: Full Family Communication: Updated daughter at bedside. Disposition:   Status is: Inpatient  Remains inpatient appropriate because: Current ongoing inpatient care.  Patient with ileus versus small bowel obstruction currently with NG tube placement, currently on TPN, not stable for discharge.       Consultants:  Palliative care: Dr. Rowe Pavy 07/16/2021 PCCM: Dr.Meier 07/24/2021 General surgery: Dr. Barry Dienes 07/24/2021 Gastroenterology: Dr. Therisa Doyne 07/29/2021  Procedures:  Ultrasound-guided thoracentesis 07/27/2021 per Dr. Serafina Royals, IR Upper extremity Dopplers 07/20/2021 Lower extremity Dopplers 07/12/2021 2D echo 07/23/2021 Ultrasound-guided paracentesis: Dr. Laurence Ferrari IR 07/17/2021--3.4 L of yellow fluid removed Ultrasound-guided paracentesis 07/26/2021 2.9 L of hazy fluid removed per Dr.Mugweru, IR CT abdomen and pelvis 06/29/2021, 07/24/2021 KUB 07/29/2021, 07/30/2021, 08/01/2021 Transfuse 1 unit packed red blood cells 08/01/2021  Antimicrobials:  IV cefepime 07/24/2021>>>> 07/27/2021 IV Flagyl 07/24/2021>>>>> 07/27/2021   Subjective: Laying in bed.  Alert.  Daughter washing her hair.  Patient noted to have pulled out NG tube overnight.  Per daughter no further nausea or emesis.  Per daughter patient with complaints of right upper quadrant pain.  Patient burping after being started on simethicone.  No flatus per daughter.  No bowel movement per daughter.   Objective: Vitals:   08/02/21 0700 08/02/21 0841 08/02/21 0900 08/02/21 1000  BP: 119/65  121/63 126/67  Pulse: 91  (!) 101 (!) 102  Resp: 20  (!) 26 (!) 26  Temp:  98.3 F (36.8 C)    TempSrc:  Oral    SpO2: 98%  98% 98%  Weight:      Height:        Intake/Output Summary (Last 24 hours) at 08/02/2021  1021 Last data filed at 08/02/2021 0800 Gross per 24 hour  Intake 3232.9 ml  Output 600 ml  Net 2632.9 ml    Filed Weights   07/28/21 0500 07/31/21 0500 08/01/21 0500  Weight: 94.1 kg 92.4 kg 92.5 kg    Examination:  General exam: More alert. Respiratory system: Lungs clear to auscultation bilaterally anterior lung fields.  No wheezes, no crackles, no rhonchi.  Normal respiratory effort.   Cardiovascular system: RRR no murmurs rubs or gallops.  1-2+ bilateral lower extremity edema. Gastrointestinal system: Abdomen more distended, tighter, some tenderness to palpation right upper quadrant, hypoactive bowel sounds.  No rebound.  No guarding. Central nervous system: More alert.  Interactive.  Moving extremities spontaneously.  No focal neurological deficits. Extremities: Symmetric 5 x 5 power. Skin: No rashes, lesions or ulcers Psychiatry: Judgement and insight unable to assess.. Mood & affect appropriate.     Data Reviewed: I have personally reviewed following labs and imaging studies  CBC: Recent Labs  Lab 07/29/21 0333 07/30/21 0354 07/31/21 0553 08/01/21 0500 08/01/21 2145 08/02/21 0600  WBC 8.4 7.0 5.9 4.0  --  4.2  NEUTROABS  --  4.6 4.1 3.1  --  3.3  HGB 9.6* 9.3* 8.7* 6.7* 8.7* 8.9*  HCT 30.9* 30.7* 29.3* 22.9* 28.4* 28.8*  MCV 85.4 85.8 87.7 89.1  --  87.0  PLT 259 235 199 164  --  196     Basic Metabolic Panel: Recent Labs  Lab 07/29/21 0333 07/30/21 0354 07/31/21 0553 08/01/21 0500 08/02/21 0600  NA 139 139 140 141 140  K 3.8 3.3* 3.6 3.9 4.0  CL 109 109 109 112* 111  CO2 24 23 23 23 23   GLUCOSE 264* 231* 122* 138* 120*  BUN 28* 36* 46* 62* 77*  CREATININE 0.34* <0.30* 0.37* 0.45 0.51  CALCIUM 8.0* 7.9* 7.9* 8.0* 8.2*  MG 2.0 1.9 2.0 1.8 2.3  PHOS 2.2* 2.3* 3.6 3.9 4.3     GFR: Estimated Creatinine Clearance: 93.2 mL/min (by C-G formula based on SCr of 0.51 mg/dL).  Liver Function Tests: Recent Labs  Lab 07/29/21 0333 07/30/21 0354  07/31/21 0553 08/01/21 0500 08/02/21 0600  AST 13* 13* 18 14* 14*  ALT 11 11 11 10 11   ALKPHOS 55 53 52 41 56  BILITOT 0.6 0.5 0.6 1.1 1.0  PROT 5.7* 5.8* 5.7* 5.9* 6.1*  ALBUMIN 2.1* 2.1* 2.0* 3.5 3.0*     CBG: Recent Labs  Lab 08/01/21 1538 08/01/21 2005 08/02/21 0004 08/02/21 0319 08/02/21 0752  GLUCAP 114* 117* 124* 113* 129*      Recent Results (from the past 240 hour(s))  MRSA Next Gen by PCR, Nasal     Status: Abnormal   Collection Time: 07/24/21  5:58 PM   Specimen: Nasal Mucosa; Nasal Swab  Result Value Ref Range Status   MRSA by PCR Next Gen DETECTED (A) NOT DETECTED Final    Comment: RESULT CALLED TO, READ BACK BY AND VERIFIED WITH: CALDERON,E RN @2141  ON 07/24/21 JACKSON,K (NOTE) The GeneXpert MRSA Assay (FDA approved for NASAL specimens only), is one component of a comprehensive MRSA colonization surveillance program. It is not intended to diagnose MRSA infection nor to guide or monitor treatment for MRSA infections. Test performance is not FDA approved in patients less than 72 years old. Performed at [facility], [place] [address], [place], [place] [address]   Culture, blood (routine x 2)     Status: None   Collection Time: 07/24/21  7:57 PM   Specimen: BLOOD  Result Value Ref Range Status   Specimen Description   Final    BLOOD BLOOD LEFT HAND Performed at [place] [address], [place], [place] [address]    Special Requests   Final    BOTTLES DRAWN AEROBIC ONLY Blood Culture adequate volume Performed at [facility] [address], [place], [place] [address]    Culture   Final    NO GROWTH 5 DAYS Performed at [facility], [place] [address] [place], [place] [address]    Report Status 07/29/2021 FINAL  Final  Culture, blood (routine x 2)     Status: None   Collection Time: 07/24/21  7:57 PM   Specimen: BLOOD  Result Value Ref Range Status   Specimen Description   Final     BLOOD LEFT WRIST Performed at [place] [address], [place], [place] [address]    Special Requests   Final    BOTTLES DRAWN AEROBIC ONLY Blood Culture adequate volume Performed at [facility] [address] [place], [place] [address]    Culture   Final    NO GROWTH 5 DAYS Performed at [facility], [place] [address], [place], [place] [address]    Report Status 07/29/2021 FINAL  Final  Urine Culture     Status: Abnormal  Collection Time: 07/25/21  1:36 AM   Specimen: In/Out Cath Urine  Result Value Ref Range Status   Specimen Description   Final    IN/OUT CATH URINE Performed at [facility], [place] [address], [place], [place] [address]    Special Requests   Final    NONE Performed at [facility], [place] [address], [place], [place] [address]    Culture MULTIPLE SPECIES PRESENT, SUGGEST RECOLLECTION (A)  Final   Report Status 07/26/2021 FINAL  Final  Culture, body fluid w Gram Stain-bottle     Status: None   Collection Time: 07/26/21  2:29 PM   Specimen: Fluid  Result Value Ref Range Status   Specimen Description FLUID PERITONEAL  Final   Special Requests BOTTLES DRAWN AEROBIC AND ANAEROBIC  Final   Culture   Final    NO GROWTH 5 DAYS Performed at [facility], [place] [address], [place], [place] [address]    Report Status 07/31/2021 FINAL  Final  Gram stain     Status: None   Collection Time: 07/26/21  2:29 PM   Specimen: Fluid  Result Value Ref Range Status   Specimen Description FLUID PERITONEAL  Final   Special Requests NONE  Final   Gram Stain   Final    MODERATE WBC SEEN NO ORGANISMS SEEN Performed at [facility], [place] [address], [place], [place] [address]    Report Status 07/28/2021 FINAL  Final  Culture, body fluid w Gram Stain-bottle     Status: None   Collection Time: 07/27/21  1:15 PM   Specimen: Fluid  Result Value Ref Range Status   Specimen Description  FLUID PLEURAL  Final   Special Requests   Final    BOTTLES DRAWN AEROBIC AND ANAEROBIC Blood Culture adequate volume   Culture   Final    NO GROWTH 5 DAYS Performed at [facility], [place] [address], [place], [place] [address]    Report Status 08/01/2021 FINAL  Final  Gram stain     Status: None   Collection Time: 07/27/21  1:15 PM   Specimen: Fluid  Result Value Ref Range Status   Specimen Description FLUID PLEURAL  Final   Special Requests   Final    BOTTLES DRAWN AEROBIC AND ANAEROBIC Blood Culture adequate volume   Gram Stain   Final    NO SQUAMOUS EPITHELIAL CELLS SEEN NO WBC SEEN NO ORGANISMS SEEN Performed at [facility], [place] [address] [place], [place] [address]    Report Status 07/29/2021 FINAL  Final          Radiology Studies: DG Abd 1 View  Result Date: 08/01/2021 CLINICAL DATA:  55 year old female with bowel obstruction. EXAM: ABDOMEN - 1 VIEW COMPARISON:  07/24/2021, 07/29/2021, 07/30/2021 FINDINGS: Similar appearing gaseous distension of the transverse colon measuring up to approximately 9 cm near the hepatic flexure. Similar appearing of gastric decompression tube with the tip in the stomach. No significant small bowel gaseous distension. Relative paucity of distal colonic gas. IMPRESSION: Similar appearing colonic obstruction versus ileus. Electronically Signed   By: Ruthann Cancer M.D.   On: 08/01/2021 09:46        Scheduled Meds:  chlorhexidine  15 mL Mouth Rinse BID   Chlorhexidine Gluconate Cloth  6 each Topical Daily   enoxaparin (LOVENOX) injection  150 mg Subcutaneous Daily   fentaNYL  1 patch Transdermal Q72H   insulin aspart  0-20 Units Subcutaneous Q4H  mouth rinse  15 mL Mouth Rinse q12n4p   metoprolol tartrate  7.5 mg Intravenous Q6H   pantoprazole (PROTONIX) IV  40 mg Intravenous Q12H   scopolamine  1 patch Transdermal Q72H   simethicone  160 mg Oral QID   sodium chloride flush  10-40 mL Intracatheter Q12H   sodium chloride  flush  3 mL Intravenous Q12H   Continuous Infusions:  sodium chloride Stopped (07/27/21 1715)   promethazine (PHENERGAN) injection (IM or IVPB) 12.5 mg (07/24/21 1511)   TPN ADULT (ION) 90 mL/hr at 08/01/21 1736   TPN ADULT (ION)       LOS: 18 days    Time spent: 40 minutes    Irine Seal, MD [facility]   To contact the attending provider between 7A-7P or the covering provider during after hours 7P-7A, please log into the web site [email or web address] and access using universal [place] password for that web site. If you do not have the password, please call the hospital operator.  08/02/2021, 10:21 AM

## 2021-08-03 ENCOUNTER — Inpatient Hospital Stay (HOSPITAL_COMMUNITY): Payer: 59

## 2021-08-03 ENCOUNTER — Inpatient Hospital Stay: Payer: Self-pay

## 2021-08-03 DIAGNOSIS — R112 Nausea with vomiting, unspecified: Secondary | ICD-10-CM | POA: Diagnosis present

## 2021-08-03 DIAGNOSIS — C259 Malignant neoplasm of pancreas, unspecified: Secondary | ICD-10-CM

## 2021-08-03 DIAGNOSIS — R579 Shock, unspecified: Secondary | ICD-10-CM

## 2021-08-03 DIAGNOSIS — E871 Hypo-osmolality and hyponatremia: Secondary | ICD-10-CM

## 2021-08-03 DIAGNOSIS — R188 Other ascites: Secondary | ICD-10-CM

## 2021-08-03 DIAGNOSIS — I959 Hypotension, unspecified: Secondary | ICD-10-CM

## 2021-08-03 DIAGNOSIS — C772 Secondary and unspecified malignant neoplasm of intra-abdominal lymph nodes: Secondary | ICD-10-CM

## 2021-08-03 DIAGNOSIS — R140 Abdominal distension (gaseous): Secondary | ICD-10-CM | POA: Diagnosis present

## 2021-08-03 DIAGNOSIS — E11649 Type 2 diabetes mellitus with hypoglycemia without coma: Secondary | ICD-10-CM

## 2021-08-03 LAB — COMPREHENSIVE METABOLIC PANEL
ALT: 11 U/L (ref 0–44)
AST: 14 U/L — ABNORMAL LOW (ref 15–41)
Albumin: 2.6 g/dL — ABNORMAL LOW (ref 3.5–5.0)
Alkaline Phosphatase: 61 U/L (ref 38–126)
Anion gap: 9 (ref 5–15)
BUN: 89 mg/dL — ABNORMAL HIGH (ref 6–20)
CO2: 19 mmol/L — ABNORMAL LOW (ref 22–32)
Calcium: 8.3 mg/dL — ABNORMAL LOW (ref 8.9–10.3)
Chloride: 113 mmol/L — ABNORMAL HIGH (ref 98–111)
Creatinine, Ser: 0.6 mg/dL (ref 0.44–1.00)
GFR, Estimated: >60 mL/min (ref >60)
Glucose, Bld: 178 mg/dL — ABNORMAL HIGH (ref 70–99)
Potassium: 4.5 mmol/L (ref 3.5–5.1)
Sodium: 141 mmol/L (ref 135–145)
Total Bilirubin: 0.8 mg/dL (ref 0.3–1.2)
Total Protein: 6.2 g/dL — ABNORMAL LOW (ref 6.5–8.1)

## 2021-08-03 LAB — CBC WITH DIFFERENTIAL/PLATELET
Abs Immature Granulocytes: 0.05 10*3/uL (ref 0.00–0.07)
Basophils Absolute: 0 10*3/uL (ref 0.0–0.1)
Basophils Relative: 1 %
Eosinophils Absolute: 0.1 10*3/uL (ref 0.0–0.5)
Eosinophils Relative: 1 %
HCT: 27.7 % — ABNORMAL LOW (ref 36.0–46.0)
Hemoglobin: 8.5 g/dL — ABNORMAL LOW (ref 12.0–15.0)
Immature Granulocytes: 1 %
Lymphocytes Relative: 20 %
Lymphs Abs: 0.9 10*3/uL (ref 0.7–4.0)
MCH: 26.6 pg (ref 26.0–34.0)
MCHC: 30.7 g/dL (ref 30.0–36.0)
MCV: 86.8 fL (ref 80.0–100.0)
Monocytes Absolute: 0.2 10*3/uL (ref 0.1–1.0)
Monocytes Relative: 4 %
Neutro Abs: 3.4 10*3/uL (ref 1.7–7.7)
Neutrophils Relative %: 73 %
Platelets: 236 10*3/uL (ref 150–400)
RBC: 3.19 MIL/uL — ABNORMAL LOW (ref 3.87–5.11)
RDW: 20.4 % — ABNORMAL HIGH (ref 11.5–15.5)
WBC: 4.6 10*3/uL (ref 4.0–10.5)
nRBC: 0.7 % — ABNORMAL HIGH (ref 0.0–0.2)

## 2021-08-03 LAB — GLUCOSE, CAPILLARY
Glucose-Capillary: 116 mg/dL — ABNORMAL HIGH (ref 70–99)
Glucose-Capillary: 125 mg/dL — ABNORMAL HIGH (ref 70–99)
Glucose-Capillary: 148 mg/dL — ABNORMAL HIGH (ref 70–99)
Glucose-Capillary: 157 mg/dL — ABNORMAL HIGH (ref 70–99)
Glucose-Capillary: 164 mg/dL — ABNORMAL HIGH (ref 70–99)
Glucose-Capillary: 166 mg/dL — ABNORMAL HIGH (ref 70–99)
Glucose-Capillary: 94 mg/dL (ref 70–99)

## 2021-08-03 LAB — HEMOGLOBIN AND HEMATOCRIT, BLOOD
HCT: 27.7 % — ABNORMAL LOW (ref 36.0–46.0)
HCT: 27 % — ABNORMAL LOW (ref 36.0–46.0)
Hemoglobin: 8.4 g/dL — ABNORMAL LOW (ref 12.0–15.0)
Hemoglobin: 8 g/dL — ABNORMAL LOW (ref 12.0–15.0)

## 2021-08-03 LAB — CEA: CEA: 35.4 ng/mL — ABNORMAL HIGH (ref 0.0–4.7)

## 2021-08-03 LAB — MAGNESIUM: Magnesium: 2.3 mg/dL (ref 1.7–2.4)

## 2021-08-03 LAB — CA 125: Cancer Antigen (CA) 125: 1112 U/mL — ABNORMAL HIGH (ref 0.0–38.1)

## 2021-08-03 LAB — PHOSPHORUS: Phosphorus: 4.6 mg/dL (ref 2.5–4.6)

## 2021-08-03 MED ORDER — ALBUMIN HUMAN 25 % IV SOLN
25.0000 g | Freq: Once | INTRAVENOUS | Status: AC
  Administered 2021-08-03: 25 g via INTRAVENOUS
  Filled 2021-08-03: qty 100

## 2021-08-03 MED ORDER — NOREPINEPHRINE 4 MG/250ML-% IV SOLN
0.0000 ug/min | INTRAVENOUS | Status: DC
Start: 2021-08-03 — End: 2021-08-04
  Administered 2021-08-03 (×3): 30 ug/min via INTRAVENOUS
  Administered 2021-08-03: 5 ug/min via INTRAVENOUS
  Administered 2021-08-04: 70 ug/min via INTRAVENOUS
  Administered 2021-08-04: 30 ug/min via INTRAVENOUS
  Filled 2021-08-03 (×3): qty 250
  Filled 2021-08-03: qty 500
  Filled 2021-08-03 (×6): qty 250

## 2021-08-03 MED ORDER — TRAVASOL 10 % IV SOLN
INTRAVENOUS | Status: DC
  Filled 2021-08-03: qty 1252.8

## 2021-08-03 MED ORDER — ALBUMIN HUMAN 5 % IV SOLN
25.0000 g | Freq: Once | INTRAVENOUS | Status: AC
  Administered 2021-08-03 – 2021-08-04 (×2): 25 g via INTRAVENOUS
  Filled 2021-08-03: qty 500

## 2021-08-03 MED ORDER — VANCOMYCIN HCL 750 MG/150ML IV SOLN
750.0000 mg | Freq: Two times a day (BID) | INTRAVENOUS | Status: DC
  Filled 2021-08-03: qty 150

## 2021-08-03 MED ORDER — FENTANYL CITRATE PF 50 MCG/ML IJ SOSY
25.0000 ug | PREFILLED_SYRINGE | INTRAMUSCULAR | Status: DC | PRN
  Administered 2021-08-03: 25 ug via INTRAVENOUS
  Filled 2021-08-03: qty 1

## 2021-08-03 MED ORDER — HYDROMORPHONE HCL 1 MG/ML IJ SOLN
0.5000 mg | INTRAMUSCULAR | Status: DC | PRN
  Administered 2021-08-03: 0.5 mg via INTRAVENOUS
  Filled 2021-08-03: qty 1

## 2021-08-03 MED ORDER — SODIUM CHLORIDE 0.9 % IV SOLN
2.0000 g | Freq: Three times a day (TID) | INTRAVENOUS | Status: DC
  Administered 2021-08-03 – 2021-08-04 (×2): 2 g via INTRAVENOUS
  Filled 2021-08-03 (×2): qty 2

## 2021-08-03 MED ORDER — VASOPRESSIN 20 UNITS/100 ML INFUSION FOR SHOCK
0.0000 [IU]/min | INTRAVENOUS | Status: DC
  Administered 2021-08-03: 0.03 [IU]/min via INTRAVENOUS
  Filled 2021-08-03: qty 100

## 2021-08-03 MED ORDER — LIDOCAINE HCL 1 % IJ SOLN
INTRAMUSCULAR | Status: AC
  Filled 2021-08-03: qty 20

## 2021-08-03 MED ORDER — FENTANYL CITRATE (PF) 100 MCG/2ML IJ SOLN: 25.0000 ug | INTRAMUSCULAR | Status: DC | PRN

## 2021-08-03 MED ORDER — DEXTROSE IN LACTATED RINGERS 5 % IV SOLN: INTRAVENOUS | Status: DC

## 2021-08-03 MED ORDER — VANCOMYCIN HCL 2000 MG/400ML IV SOLN
2000.0000 mg | Freq: Once | INTRAVENOUS | Status: AC
  Administered 2021-08-03: 2000 mg via INTRAVENOUS
  Filled 2021-08-03: qty 400

## 2021-08-03 MED ORDER — SODIUM CHLORIDE 0.9 % IV BOLUS
1000.0000 mL | Freq: Once | INTRAVENOUS | Status: AC
  Administered 2021-08-03: 1000 mL via INTRAVENOUS

## 2021-08-03 NOTE — Progress Notes (Signed)
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  She has undergone extensive treatment since May 2021.  Her cancer seems to have progressed since then.    Events in the last 7 days-patient continues to be tachycardic with a resting heart rate in the 130s to 140s with severe deconditioning and increasing in heart rate with any activity.  Her nausea and vomiting had subsided initially however she had not had any bowel movement so she was given MiraLAX Dulcolax and lactulose for 1 day.  She started having diarrhea and had large bowel movements and a rectal tube was placed at that point.  All the laxatives were stopped.  She received laxatives only 1 day.  Then she started to have intractable nausea vomiting NG tube was placed and she was moved to stepdown unit.  Stat CT of the abdomen and pelvis without contrast(she has severe contrast allergy) possible ischemic bowel and colonic ileus.  PCCM and general surgery was consulted. When she was moved to stepdown unit her respiratory rate was in the 40s and her heart rate was in the 150s. Dr. Barry Dienes saw the patient she did not think patient has ischemic bowel and even if she did have ischemic bowel she was not a candidate for surgery due to peritoneal carcinomatosis.  Her lactic acid was normal. PCCM followed the patient over the weekend and signed off on 07/25/2021. Patient also developed increasing right pleural effusion and increasing ascites. She received albumin infusion for 6 doses. Xarelto was stopped and heparin was started as there was some blood-tinged urine as well as NG tube  drainage.  And she was n.p.o. due to colonic ileus and nausea vomiting with NG tube in place. Her medications were narrowed down on 07/23/2021 to include only the ones she really needs it and was able to be converted to IV.   07/26/2021 2.9 L of fluid taken out paracentesis 07/27/2021 600 cc of hazy amber-colored right thoracentesis fluid sent for studies Patient started on TPN Heparin stopped Lovenox started Patient pulled out NG tube on 07/26/2021 this was replaced on 07/27/2021 as she again started with bilious vomiting   Daughter involved very involved with patient's care and patient remains full code in spite of multiple consultants PCCM oncology palliative care General surgery and hospitalist team explaining to her that her prognosis is extremely poor prognosis.   Assessment & Plan:   Principal Problem:   Acute respiratory failure with hypoxia (HCC) Active Problems:   Type 2 diabetes mellitus (HCC)   Stage IV adenocarcinoma of pancreas (HCC)   Essential hypertension, benign   Abdominal ascites   Pleural effusion on right   Hyponatremia   Internal jugular (IJ) vein thromboembolism, chronic, right (HCC)   Goals of care, counseling/discussion   Intravascular volume depletion   Malnutrition of moderate degree   Ileus (HCC)   Hypokalemia   Hypophosphatemia   Hypomagnesemia   DVT (deep venous thrombosis) (HCC)   Anemia   Distended abdomen   Pancreatic cancer metastasized to intra-abdominal lymph node (HCC)   Hypotension  #1 acute hypoxemic respiratory failure secondary to large right pleural effusion status postthoracentesis 07/27/2021 -Improved.  Also in part secondary to poor diaphragmatic excursion with massive abdominal distention. -NG tube placed, status post thoracenteses. -Currently with sats of 100% on room air.  2.  Stage IV adenocarcinoma of the pancreas diagnosed 2021 with mets to the peritoneum, liver. -Patient undergone multiple rounds of chemotherapy and  radiation. -Patient with progression of disease. -Patient being followed by oncology Dr. Marin Olp and patient deemed not a candidate for any chemotherapy at this time. -CT abdomen 07/24/2021-large right effusion substantial ascites extensive peritoneal metastatic disease large cystic pelvic masses pancreatic tail tumor and heterogenicity in the liver suspicious for hepatic metastatic lesions atelectasis in the left lower lobe right middle lobe and right lower lobe stable small pericardial effusion. -Patient status post paracentesis and thoracentesis. -Patient noted to have pulled out NG tube 07/26/2021 and KUB done with worsening colonic ileus and as such NG tube placed back in as patient did have ongoing nausea and emesis. -No significant change with colonic ileus. -Patient noted to have pulled out NG tube overnight 08/01/2021, no further nausea or emesis. -Hold off on replacing tube at this time unless patient develops significant nausea or emesis. -Patient with very poor prognosis.   -Palliative care consulted however due to religious believes patient's daughter believes patient is going to be healed and recommended ongoing aggressive treatment at this time.  3.  Malignant ascites -Status post paracentesis 07/26/2021 with cytology consistent with malignant cells consistent with metastatic adenocarcinoma. -2.9 L of fluid removed. -Patient with abdominal distention today, abdominal tightness, concern for recurrent ascites. -Paracentesis ordered per oncology this morning, patient taken to IR however due to systolic blood pressures in the 70s procedure canceled. -Oncology following  4.  Hypotension -Patient noted to be hypotensive today with systolics in the 40J while in IR for possible paracentesis which was subsequently canceled. -??  Etiology may be secondary to recurrent malignant ascites possibly pushing on the IVC versus continued decline and possibly in the dying stages. -1 L normal saline  given, IV albumin x1 given however patient still hypotensive with last systolic blood pressure of 70/50. -Family still insistent on aggressive measures. -Placed on Levophed drip. -Consult with PCCM for further evaluation and management.  5.  Sinus tachycardia -Likely multifocal secondary to acute illnesses including progressive stage IV adenocarcinoma of the pancreas, colonic ileus, pleural effusion. -2D echo done with normal EF. -Tachycardia improved on current dose of IV Lopressor. -Follow.  6.  Right IJ thrombus -Hold Lovenox today due to bloody bowel movement noted overnight.   -Per hematology/oncology.   7.  Fever -Likely tumor fever. -Fever curve trending down. -Status post course of cefepime and Flagyl. -No further antibiotics needed.  8.  Colonic ileus -Likely secondary to stage IV adenocarcinoma of the pancreas with peritoneal mets in the setting of opioid pain medication. -Patient noted to have pulled out NG tube 07/26/2021 with KUB showing worsening colonic ileus. -NG tube placed back in 07/27/2021 with a output of 200 cc over the past 24 hours. -Rectal tube discontinued. -Patient very weak making mobility difficult.   -Repeat KUB done 07/30/2021, with persistent but mildly improved distended loops of bowel noted.   -KUB with similar appearing colonic obstruction versus ileus.  -Patient with hypoactive bowel sounds . -Patient noted to have pulled out NG tube overnight. -Hold off on placing NG tube back in unless patient has ongoing nausea and vomiting. -No nausea or emesis. -Minimize narcotics. -Continue simethicone 4 times daily. -PT/OT, mobilize as able to. -Patient seen in consultation by GI who feel  patient is not a candidate for colonoscopy and decompression and anesthesia, not a candidate for neostigmine -peritoneal carcinomatosis, risk of arrhythmia, bradycardia, cardiac arrest, respiratory arrest, respiratory depression.  Per GI patient with very poor prognosis.   -Goal to keep potassium approximately at 4, magnesium approximately at 2. -Continue current supportive care.   9.  Hypokalemia/hypophosphatemia/hypomagnesemia -Patient on TPN, being repleted per pharmacy. -Potassium at 4.5 today, phosphorus at 4.6, magnesium at 2.3. -Goal is to keep potassium approximately 4, magnesium at 2. -Electrolytes being managed per pharmacy.  10.  Moderate protein calorie malnutrition -On TPN. -Thiamine added to TPN per RD recommendations due to concern for refeeding syndrome. -Status post IV albumin every 6 hours x1 day (07/31/2021). -Albumin at 2.6 today. -Patient hypotensive and as such we will give IV albumin x1. -Follow.  11.  Hyperglycemia -Likely secondary to TPN -CBG 166 this morning.   -Insulin being adjusted in TPN per pharmacy.   12.  Anemia -Likely secondary to stage IV pancreatic adenocarcinoma and recent chemotherapy. -Patient with bloody bowel movement noted overnight. -Anemia panel consistent with anemia of chronic disease.  Iron level of 19.  Folate of 16.8.   -Status post transfusion of 1 unit packed red blood cells 08/01/2021 with hemoglobin currently at 8.4. -Continue IV PPI. -Follow H&H.   -Transfusion threshold hemoglobin < 7.  13.  Goals of care -Patient with a very poor prognosis.  Patient with stage IV pancreatic adenocarcinoma with peritoneal mets.  Patient with colonic ileus.  Patient with recurrent malignant ascites. -Palliative care consulted however due to religious believes patient's daughter believes patient is going to be healed and recommended ongoing aggressive treatment at this time. -Patient now hypotensive, recurrent malignant ascites, some use of accessory muscles of respiration. -Patient likely actively dying however family wants ongoing aggressive care. -Palliative care following.   DVT prophylaxis: Lovenox Code Status: Full Family Communication: Updated daughter and son at bedside. Disposition:   Status  is: Inpatient  Remains inpatient appropriate because: Current ongoing inpatient care.  Patient with ileus versus small bowel obstruction currently with NG tube placement, currently on TPN, not stable for discharge.       Consultants:  Palliative care: Dr. Rowe Pavy 07/16/2021 PCCM: Dr.Meier 07/24/2021 General surgery: Dr. Barry Dienes 07/24/2021 Gastroenterology: Dr. Therisa Doyne 07/29/2021  Procedures:  Ultrasound-guided thoracentesis 07/27/2021 per Dr. Serafina Royals, IR Upper extremity Dopplers 07/20/2021 Lower extremity Dopplers 07/14/2021 2D echo 07/23/2021 Ultrasound-guided paracentesis: Dr. Laurence Ferrari IR 07/17/2021--3.4 L of yellow fluid removed Ultrasound-guided paracentesis 07/26/2021 2.9 L of hazy fluid removed per Dr.Mugweru, IR CT abdomen and pelvis 07/17/2021, 07/24/2021 KUB 07/29/2021, 07/30/2021, 08/01/2021 Transfuse 1 unit packed red blood cells 08/01/2021  Antimicrobials:  IV cefepime 07/24/2021>>>> 07/27/2021 IV Flagyl 07/24/2021>>>>> 07/27/2021   Subjective: Patient just coming back from IR where paracenteses attempted however canceled as patient noted to be unstable with systolics in the 93J.  Assessed patient at bedside patient with some use of accessory muscles of respiration, visibly short of breath although satting greater than 90% on room air.  Patient noted to have bloody bowel movement overnight however none since then.  Hemoglobin stable this morning at 8.4  Objective: Vitals:   08/03/21 1425 08/03/21 1520 08/03/21 1524 08/03/21 1535  BP: 90/73 (!) 83/53  (!) 70/50  Pulse: (!) 137 (!) 134  (!) 126  Resp: (!) 37 (!) 38    Temp: 98.4 F (36.9 C) 97.7 F (36.5 C) (P) 97.9 F (36.6 C)   TempSrc: Axillary Oral (P) Oral   SpO2: 99% 96%  Weight:      Height:        Intake/Output Summary (Last 24 hours) at 08/03/2021 1552 Last data filed at 08/03/2021 0900 Gross per 24 hour  Intake 840.16 ml  Output --  Net 840.16 ml   Filed Weights   07/31/21 0500 08/01/21 0500 08/03/21  1000  Weight: 92.4 kg 92.5 kg 101.2 kg    Examination:  General exam: More alert.  Some use of accessory muscles of respiration. Respiratory system: CTA B anterior lung fields.  No wheezes, no crackles, no rhonchi.  Normal respiratory effort. Cardiovascular system: Tachycardia.  No murmurs rubs or gallops.  2+ bilateral lower extremity edema. Gastrointestinal system: Abdomen is distended, tight, diffusely tender to palpation.  Hypoactive bowel sounds.  Central nervous system: More alert.  Interactive.  Moving extremities spontaneously.  No focal neurological deficits. Extremities: Symmetric 5 x 5 power. Skin: No rashes, lesions or ulcers Psychiatry: Judgement and insight unable to assess.. Mood & affect appropriate.     Data Reviewed: I have personally reviewed following labs and imaging studies  CBC: Recent Labs  Lab 07/30/21 0354 07/31/21 0553 08/01/21 0500 08/01/21 2145 08/02/21 0600 08/03/21 0156 08/03/21 1008  WBC 7.0 5.9 4.0  --  4.2 4.6  --   NEUTROABS 4.6 4.1 3.1  --  3.3 3.4  --   HGB 9.3* 8.7* 6.7* 8.7* 8.9* 8.5* 8.4*  HCT 30.7* 29.3* 22.9* 28.4* 28.8* 27.7* 27.7*  MCV 85.8 87.7 89.1  --  87.0 86.8  --   PLT 235 199 164  --  196 236  --     Basic Metabolic Panel: Recent Labs  Lab 07/30/21 0354 07/31/21 0553 08/01/21 0500 08/02/21 0600 08/03/21 0156  NA 139 140 141 140 141  K 3.3* 3.6 3.9 4.0 4.5  CL 109 109 112* 111 113*  CO2 23 23 23 23  19*  GLUCOSE 231* 122* 138* 120* 178*  BUN 36* 46* 62* 77* 89*  CREATININE <0.30* 0.37* 0.45 0.51 0.60  CALCIUM 7.9* 7.9* 8.0* 8.2* 8.3*  MG 1.9 2.0 1.8 2.3 2.3  PHOS 2.3* 3.6 3.9 4.3 4.6    GFR: Estimated Creatinine Clearance: 97.7 mL/min (by C-G formula based on SCr of 0.6 mg/dL).  Liver Function Tests: Recent Labs  Lab 07/30/21 0354 07/31/21 0553 08/01/21 0500 08/02/21 0600 08/03/21 0156  AST 13* 18 14* 14* 14*  ALT 11 11 10 11 11   ALKPHOS 53 52 41 56 61  BILITOT 0.5 0.6 1.1 1.0 0.8  PROT 5.8* 5.7*  5.9* 6.1* 6.2*  ALBUMIN 2.1* 2.0* 3.5 3.0* 2.6*    CBG: Recent Labs  Lab 08/02/21 2039 08/03/21 0020 08/03/21 0457 08/03/21 0740 08/03/21 1226  GLUCAP 118* 148* 166* 157* 116*     Recent Results (from the past 240 hour(s))  MRSA Next Gen by PCR, Nasal     Status: Abnormal   Collection Time: 07/24/21  5:58 PM   Specimen: Nasal Mucosa; Nasal Swab  Result Value Ref Range Status   MRSA by PCR Next Gen DETECTED (A) NOT DETECTED Final    Comment: RESULT CALLED TO, READ BACK BY AND VERIFIED WITH: CALDERON,E RN @2141  ON 07/24/21 JACKSON,K (NOTE) The GeneXpert MRSA Assay (FDA approved for NASAL specimens only), is one component of a comprehensive MRSA colonization surveillance program. It is not intended to diagnose MRSA infection nor to guide or monitor treatment for MRSA infections. Test performance is not FDA approved in patients less than 64 years old. Performed at [facility],  [place] [address] [place], [place] [address]   Culture, blood (routine x 2)     Status: None   Collection Time: 07/24/21  7:57 PM   Specimen: BLOOD  Result Value Ref Range Status   Specimen Description   Final    BLOOD BLOOD LEFT HAND Performed at [place] [address], [place], [place] [address]    Special Requests   Final    BOTTLES DRAWN AEROBIC ONLY Blood Culture adequate volume Performed at [place] [address], [place], [place] [address]    Culture   Final    NO GROWTH 5 DAYS Performed at [facility], [place] [address] [place], [place] [address]    Report Status 07/29/2021 FINAL  Final  Culture, blood (routine x 2)     Status: None   Collection Time: 07/24/21  7:57 PM   Specimen: BLOOD  Result Value Ref Range Status   Specimen Description   Final    BLOOD LEFT WRIST Performed at [place] [address], [place], [place] [address]    Special Requests   Final    BOTTLES DRAWN AEROBIC ONLY  Blood Culture adequate volume Performed at [place] [address], [place], [place] [address]    Culture   Final    NO GROWTH 5 DAYS Performed at [facility], [place] [address], [place], [place] [address]    Report Status 07/29/2021 FINAL  Final  Urine Culture     Status: Abnormal   Collection Time: 07/25/21  1:36 AM   Specimen: In/Out Cath Urine  Result Value Ref Range Status   Specimen Description   Final    IN/OUT CATH URINE Performed at [place] [address], [place], [place] [address]    Special Requests   Final    NONE Performed at [facility], [place] [address], [place], [place] [address]    Culture MULTIPLE SPECIES PRESENT, SUGGEST RECOLLECTION (A)  Final   Report Status 07/26/2021 FINAL  Final  Culture, body fluid w Gram Stain-bottle     Status: None   Collection Time: 07/26/21  2:29 PM   Specimen: Fluid  Result Value Ref Range Status   Specimen Description FLUID PERITONEAL  Final   Special Requests BOTTLES DRAWN AEROBIC AND ANAEROBIC  Final   Culture   Final    NO GROWTH 5 DAYS Performed at [facility], [place] [address] [place], [place] [address]    Report Status 07/31/2021 FINAL  Final  Gram stain     Status: None   Collection Time: 07/26/21  2:29 PM   Specimen: Fluid  Result Value Ref Range Status   Specimen Description FLUID PERITONEAL  Final   Special Requests NONE  Final   Gram Stain   Final    MODERATE WBC SEEN NO ORGANISMS SEEN Performed at [facility], [place] [address] [place], [place] [address]    Report Status 07/28/2021 FINAL  Final  Culture, body fluid w Gram Stain-bottle     Status: None   Collection Time: 07/27/21  1:15 PM   Specimen: Fluid  Result Value Ref Range Status   Specimen Description FLUID PLEURAL  Final   Special Requests   Final    BOTTLES DRAWN AEROBIC AND ANAEROBIC Blood Culture adequate volume   Culture   Final    NO GROWTH 5 DAYS Performed  at [facility], [address], [place], [place] [address]    Report Status 08/01/2021 FINAL  Final  Gram stain     Status: None   Collection Time: 07/27/21  1:15 PM   Specimen: Fluid  Result Value Ref Range Status   Specimen Description FLUID PLEURAL  Final   Special Requests   Final    BOTTLES DRAWN AEROBIC AND ANAEROBIC Blood Culture adequate volume   Gram Stain   Final    NO SQUAMOUS EPITHELIAL CELLS SEEN NO WBC SEEN NO ORGANISMS SEEN Performed at [facility], [place] [address], [place], [place] [address]    Report Status 07/29/2021 FINAL  Final          Radiology Studies: DG Abd 1 View  Result Date: 08/03/2021 CLINICAL DATA:  Abdominal pain and distention EXAM: ABDOMEN - 1 VIEW COMPARISON:  Previous studies including the examination of 08/01/2021 FINDINGS: There is gaseous distention of transverse colon measuring up to 8.9 cm in diameter. There is no distention of rectosigmoid or small bowel loops. Upper abdomen is not included in its entirety limiting evaluation of stomach. IMPRESSION: There is gaseous distention of transverse colon suggesting ileus or distal colonic obstruction. No significant interval changes are noted since 08/01/2021. Electronically Signed   By: Elmer Picker M.D.   On: 08/03/2021 09:49   [place] ASCITES (ABDOMEN LIMITED)  Result Date: 08/03/2021 CLINICAL DATA:  Abdominal distention EXAM: LIMITED ABDOMEN ULTRASOUND FOR ASCITES TECHNIQUE: Limited ultrasound survey for ascites was performed in all four abdominal quadrants. COMPARISON:  None. FINDINGS: Large ascites is present in all 4 quadrants IMPRESSION: Large ascites. Electronically Signed   By: Elmer Picker M.D.   On: 08/03/2021 12:30        Scheduled Meds:  chlorhexidine  15 mL Mouth Rinse BID   Chlorhexidine Gluconate Cloth  6 each Topical Daily   fentaNYL  1 patch Transdermal Q72H   insulin aspart  0-20 Units Subcutaneous Q4H   mouth rinse  15 mL Mouth Rinse q12n4p    metoprolol tartrate  7.5 mg Intravenous Q6H   pantoprazole (PROTONIX) IV  40 mg Intravenous Q12H   scopolamine  1 patch Transdermal Q72H   simethicone  160 mg Oral QID   sodium chloride flush  10-40 mL Intracatheter Q12H   sodium chloride flush  3 mL Intravenous Q12H   Continuous Infusions:  sodium chloride Stopped (07/27/21 1715)   norepinephrine (LEVOPHED) Adult infusion     promethazine (PHENERGAN) injection (IM or IVPB) 12.5 mg (07/24/21 1511)   TPN ADULT (ION) 90 mL/hr at 08/02/21 1812   TPN ADULT (ION)       LOS: 19 days    Time spent: 45 minutes    Irine Seal, MD [facility]   To contact the attending provider between 7A-7P or the covering provider during after hours 7P-7A, please log into the web site [email or web address] and access using universal [place] password for that web site. If you do not have the password, please call the hospital operator.  08/03/2021, 3:52 PM

## 2021-08-03 NOTE — Significant Event (Signed)
Rapid Response Event Note   Reason for Call :  Rectal bleed.  Initial Focused Assessment:  Patient alert, VSS stable, had large bloody bowel movement.   Interventions:  Assessed, reviewed chart, discussed care needs/desires with patient's daughter, contacted J. Olena Heckle, NP.  Plan of Care:  CBC ordered to determine needs for transfusion. Continue to monitor.   Event Summary:   MD Notified: Clarene Essex, NP Call Time: [place] Time: 2310 End Time: [place], RN

## 2021-08-03 NOTE — Progress Notes (Signed)
Pt being transferred to ICU at this time. RN to draw labs upon unit arrival

## 2021-08-03 NOTE — Progress Notes (Signed)
Pharmacy Antibiotic Note  Angela Wilson is a 55 y.o. female with metastatic pancreatic adenocarcinoma who is known to pharmacy from current TPN consult.  Pharmacy has been consulted on 11/15 to dose vancomycin for sepsis.  - scr 0.60 (crcl~96)  Plan: - vancomycin 2000 mg IV x1, then 750 mg q12h for est AUC 448 - cefepime 2gm q8h per MD ________________________________________ Height: 5\' 6"  (167.6 cm) Weight: 99 kg (218 lb 4.1 oz) IBW/kg (Calculated) : 59.3  Temp (24hrs), Avg:97.9 F (36.6 C), Min:97.4 F (36.3 C), Max:98.7 F (37.1 C)  Recent Labs  Lab 07/30/21 0354 07/31/21 0553 08/01/21 0500 08/02/21 0600 08/03/21 0156  WBC 7.0 5.9 4.0 4.2 4.6  CREATININE <0.30* 0.37* 0.45 0.51 0.60    Estimated Creatinine Clearance: 96.5 mL/min (by C-G formula based on SCr of 0.6 mg/dL).    Allergies  Allergen Reactions   Ivp Dye [Iodinated Diagnostic Agents] Itching and Rash   Morphine And Related Hives   Penicillins Rash     Thank you for allowing pharmacy to be a part of this patient's care.  Lynelle Doctor 08/03/2021 7:19 PM

## 2021-08-03 NOTE — Progress Notes (Signed)
   08/03/21 0846  Assess: MEWS Score  Temp 98.7 F (37.1 C)  BP (!) 126/56  Pulse Rate (!) 140  Resp (!) 32  SpO2 96 %  O2 Device Room Air  Assess: MEWS Score  MEWS Temp 0  MEWS Systolic 0  MEWS Pulse 3  MEWS RR 2  MEWS LOC 0  MEWS Score 5  MEWS Score Color Red  Assess: SIRS CRITERIA  SIRS Temperature  0  SIRS Pulse 1  SIRS Respirations  1  SIRS WBC 0  SIRS Score Sum  2   IV lopressor given.  Dr. Grandville Silos and charge RN made aware

## 2021-08-03 NOTE — Progress Notes (Signed)
Patient pressure reading 83/53, pt diaphoretic.  HR sustaining in the 140's.  MD, charge and rapid response made aware.  Pt started on levophed when rapid response arrived.  Transported to ICU. Family aware.

## 2021-08-03 NOTE — Progress Notes (Signed)
Daily Progress Note   Patient Name: Angela Wilson       Date: 08/03/2021 DOB: 1966/05/24  Age: 55 y.o. MRN#: [id number] Attending Physician: Lanier Clam, MD Primary Care Physician: Just, Laurita Quint, FNP (Inactive) Admit Date: 06/20/2021  Reason for Consultation/Follow-up: Establishing goals of care  Subjective: [name] is lying in bed.  Restless and agitated.  Discussed with daughter at bedside regarding continued concern about decline despite medical interventions.  She is upset seeing her mother in more pain and we discussed difficulty of trying to provide effective pain management in light of hypotension.  We discussed aggressiveness of care and consideration focus on comfort.  Remains invested in plan for continued aggressive interventions.   Length of Stay: 19  Current Medications: Scheduled Meds:   chlorhexidine  15 mL Mouth Rinse BID   Chlorhexidine Gluconate Cloth  6 each Topical Daily   fentaNYL  1 patch Transdermal Q72H   insulin aspart  0-20 Units Subcutaneous Q4H   mouth rinse  15 mL Mouth Rinse q12n4p   metoprolol tartrate  7.5 mg Intravenous Q6H   pantoprazole (PROTONIX) IV  40 mg Intravenous Q12H   scopolamine  1 patch Transdermal Q72H   simethicone  160 mg Oral QID   sodium chloride flush  10-40 mL Intracatheter Q12H   sodium chloride flush  3 mL Intravenous Q12H    Continuous Infusions:  sodium chloride Stopped (07/27/21 1715)   ceFEPime (MAXIPIME) IV Stopped (08/03/21 2112)   dextrose 5% lactated ringers 75 mL/hr at 08/03/21 2035   norepinephrine (LEVOPHED) Adult infusion 30 mcg/min (08/03/21 2126)   promethazine (PHENERGAN) injection (IM or IVPB) 12.5 mg (07/24/21 1511)   TPN ADULT (ION) Stopped (08/03/21 1807)   vancomycin 2,000 mg  (08/03/21 2100)   [START ON August 18, 2021] vancomycin      PRN Meds: sodium chloride, acetaminophen **OR** acetaminophen, HYDROmorphone (DILAUDID) injection, lip balm, LORazepam, metoprolol tartrate, ondansetron **OR** ondansetron (ZOFRAN) IV, phenol, prochlorperazine, promethazine (PHENERGAN) injection (IM or IVPB), sodium chloride flush, sodium chloride flush  Physical Exam         Appears chronically ill fatigued Regular work of breathing Has O2 [place] Abdomen distended Has edema  Pulled out NG tube  Vital Signs: BP 122/78   Pulse (!) 126   Temp (!) 97.4 F (36.3 C) (Oral)   Resp Marland Kitchen)  29   Ht 5\' 6"  (1.676 m)   Wt 99 kg   SpO2 94%   BMI 35.23 kg/m  SpO2: SpO2: 94 % O2 Device: O2 Device: Nasal Cannula O2 Flow Rate: O2 Flow Rate (L/min): 2 L/min  Intake/output summary:  Intake/Output Summary (Last 24 hours) at 08/03/2021 2217 Last data filed at 08/03/2021 1906 Gross per 24 hour  Intake 2475.42 ml  Output --  Net 2475.42 ml    LBM: Last BM Date: 08/03/21 Baseline Weight: Weight: 100.7 kg Most recent weight: Weight: 99 kg PPS 30%      Palliative Assessment/Data:      Patient Active Problem List   Diagnosis Date Noted   Distended abdomen    Pancreatic cancer metastasized to intra-abdominal lymph node (HCC)    Hypotension    Nausea and vomiting    Shock ([place])    DVT (deep venous thrombosis) (HCC)    Anemia    Malnutrition of moderate degree 07/28/2021   Ileus (HCC)    Hypokalemia    Hypophosphatemia    Hypomagnesemia    Intravascular volume depletion 07/18/2021   Internal jugular (IJ) vein thromboembolism, chronic, right ([place]) 07/16/2021   Goals of care, counseling/discussion 07/16/2021   Acute respiratory failure with hypoxia ([place]) 07/04/2021   Abdominal ascites 06/28/2021   Pleural effusion on right 07/07/2021   Hyponatremia 07/17/2021   Genetic testing 05/12/2020   Family history of breast cancer    Family history of ovarian cancer    Family history of  kidney cancer    Family history of brain cancer    Essential hypertension, benign 04/06/2020   Pancreatic cancer ([place]) 02/14/2020   Cancer associated pain 02/13/2020   Stage IV adenocarcinoma of pancreas ([place]) 02/03/2020   Screening for colon cancer 01/07/2020   Encounter for screening mammogram for malignant neoplasm of breast 01/07/2020   Need for Tdap vaccination 01/07/2020   Pancreatic mass 12/30/2019   Nephrolithiasis 12/20/2012   GERD (gastroesophageal reflux disease) 12/20/2012   Type 2 diabetes mellitus ([place]) 11/09/2012    Palliative Care Assessment & Plan   Patient Profile:    Assessment:  55 yo lady with life limiting illness of pancreatic cancer, ascites, abdominal pain, LE edema.   Recommendations/Plan: Family remains invested in desiring full aggressive care.  She has my contact information and will call if they would like to reconsider focus of care.  I do not anticipate this to be the case as family has been consistent in desire for aggressive care due to religious beliefs.  Goals of Care and Additional Recommendations: Limitations on Scope of Treatment: Full Scope Treatment  Code Status:    Code Status Orders  (From admission, onward)           Start     Ordered   06/22/2021 2022  Full code  Continuous        06/22/2021 2024           Code Status History     Date Active Date Inactive Code Status Order ID Comments User Context   02/14/2020 0213 02/18/2020 2347 Full Code [id number]  Clance Boll, MD Inpatient       Prognosis: She is worsening.  I fear she is beginning terminal decline.  Discharge Planning: To Be Determined although I fear this will be a terminal admission.  Care plan was discussed with IDT.    Thank you for allowing the Palliative Medicine Team to assist in the care of this patient.   Total  Time 30 Prolonged Time Billed  no   Greater than 50%  of this time was spent counseling and coordinating care related to the above  assessment and plan.  Micheline Rough, MD  Please contact Palliative Medicine Team phone at [phone] for questions and concerns from 7 AM-7 PM.  After 7 PM, please call primary service.

## 2021-08-03 NOTE — Progress Notes (Signed)
PT Cancellation Note  Patient Details Name: Angela Wilson MRN: [id number] DOB: [birth year]   Cancelled Treatment:     pt was in room 1407 and per chart review pt not medically stable for Physical Therapy with resting HR 140's.  Later pt was transferred back to step down ICU.  Will continue to monitor.    Rica Koyanagi  PTA Acute  Rehabilitation Services Pager      [phone] Office      [phone]

## 2021-08-03 NOTE — Progress Notes (Addendum)
NAMENancyjo Wilson, MRN:  [id number], DOB:  1966/01/31, LOS: 31 ADMISSION DATE:  07/17/2021, CONSULTATION DATE:  11/15 REFERRING MD:  Dr. 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 Her cancer has unfortunately continued to progress despite treatment. She has been inpatient since 10/27 when she was admitted with abdominal pain and weakness. Course complicated by malignant ascites and pleural effusion requiring palliative drainage. Then complicated by refractory nausea and vomiting ultimately resulting in the initiation of TPN. She has been followed by oncology, surgery, and palliative care in the addition to [facility]. Unfortunately she has been delivered a terminal prognosis by specialist involved.   11/15 she presented to IR for paracentesis and was found to be hypotensive with systolic blood pressures in the 70s. She was given albumin and IVF without a positive impact. She was transferred to the ICU for the initiation of vasopressors and PCCM was consulted.   Pertinent  Medical History    Significant Hospital Events: Including procedures, antibiotic start and stop dates in addition to other pertinent events     Interim History / Subjective:    Objective   Blood pressure (!) 61/47, pulse (!) 125, temperature 97.6 F (36.4 C), temperature source Axillary, resp. rate (!) 39, height 5\' 6"  (1.676 m), weight 99 kg, SpO2 92 %.        Intake/Output Summary (Last 24 hours) at 08/03/2021 1719 Last data filed at 08/03/2021 1630 Gross per 24 hour  Intake 2088.44 ml  Output --  Net 2088.44 ml   Filed Weights   08/01/21 0500 08/03/21 1000 08/03/21 1628  Weight: 92.5 kg 101.2 kg 99 kg    Examination: General: female appears older that stated age HENT: [place]/AT, PERRL, no JVD Lungs: Coarse bilateral breath sounds. Diminished in the  bases.  Cardiovascular: Tachy, regular, no MRG Abdomen: Firm, massively distended, non-tender.  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 No longer a candidate for further therapy. Complicated by malignant ascites and pleural effusion.  - Oncology following - Supportive care - Therapeutic paracentesis/thoracentesis are reasonable if she is able to regain some stability.   Shock: etiology uncertain. Could be hypovolemic in the setting of hypoalbuminemia/ third spacing or ongoing diarrhea. As Dr. Grandville Silos has speculated perhaps there is a mass or ascites compressing the IVC - Norepinephrine for MAP goal 65 mmHg - Norepi cap at 30 mcg via port - Gentle IVF resuscitation  Failure to thrive Malnutrition - nutrition per primary/dietician - to wean presently to participate with therapy  Right IJ thrombus - enoxaparin on hole due to hematochezia 11/15  Colonic ileus - Surgery has seen, not a surgical candidate.  - Seen by GI, not a candidate for decompression of neostigmine  - Complicated by ongoing opioid need  Electrolyte abnormalities: hypokalemia, hypophosphatemia, hypomanesemia - Daily assessment - Repletion per pharmacy/TPN  Goals of care:  - Dr Silas Flood had a long discussion with the patient's daughter. She understands the course is terminal, however, she wishes the medical team to do all things possible to help her mother. There will come a point where further measure may only prolong suffering and will not aid recovery.   Best Practice (right click and "  Reselect all SmartList Selections" daily)   Diet/type: NPO DVT prophylaxis: not indicated GI prophylaxis: PPI Lines: Central line Foley:  N/A Code Status:  full code Last date of  multidisciplinary goals of care discussion [ ]   Labs   CBC: Recent Labs  Lab 07/30/21 0354 07/31/21 0553 08/01/21 0500 08/01/21 2145 08/02/21 0600 08/03/21 0156 08/03/21 1008  WBC 7.0 5.9 4.0  --  4.2 4.6  --   NEUTROABS 4.6 4.1 3.1  --  3.3 3.4  --   HGB 9.3* 8.7* 6.7* 8.7* 8.9* 8.5* 8.4*  HCT 30.7* 29.3* 22.9* 28.4* 28.8* 27.7* 27.7*  MCV 85.8 87.7 89.1  --  87.0 86.8  --   PLT 235 199 164  --  196 236  --     Basic Metabolic Panel: Recent Labs  Lab 07/30/21 0354 07/31/21 0553 08/01/21 0500 08/02/21 0600 08/03/21 0156  NA 139 140 141 140 141  K 3.3* 3.6 3.9 4.0 4.5  CL 109 109 112* 111 113*  CO2 23 23 23 23  19*  GLUCOSE 231* 122* 138* 120* 178*  BUN 36* 46* 62* 77* 89*  CREATININE <0.30* 0.37* 0.45 0.51 0.60  CALCIUM 7.9* 7.9* 8.0* 8.2* 8.3*  MG 1.9 2.0 1.8 2.3 2.3  PHOS 2.3* 3.6 3.9 4.3 4.6   GFR: Estimated Creatinine Clearance: 96.5 mL/min (by C-G formula based on SCr of 0.6 mg/dL). Recent Labs  Lab 07/31/21 0553 08/01/21 0500 08/02/21 0600 08/03/21 0156  WBC 5.9 4.0 4.2 4.6    Liver Function Tests: Recent Labs  Lab 07/30/21 0354 07/31/21 0553 08/01/21 0500 08/02/21 0600 08/03/21 0156  AST 13* 18 14* 14* 14*  ALT 11 11 10 11 11   ALKPHOS 53 52 41 56 61  BILITOT 0.5 0.6 1.1 1.0 0.8  PROT 5.8* 5.7* 5.9* 6.1* 6.2*  ALBUMIN 2.1* 2.0* 3.5 3.0* 2.6*   No results for input(s): LIPASE, AMYLASE in the last 168 hours. No results for input(s): AMMONIA in the last 168 hours.  ABG No results found for: PHART, PCO2ART, PO2ART, HCO3, TCO2, ACIDBASEDEF, O2SAT   Coagulation Profile: No results for input(s): INR, PROTIME in the last 168 hours.  Cardiac Enzymes: No results for input(s): CKTOTAL, CKMB, CKMBINDEX, TROPONINI in the last 168 hours.  HbA1C: Hemoglobin A1C  Date/Time Value Ref Range Status  01/07/2020 12:04 PM 9.6 (A) 4.0 - 5.6 % Final   Hgb A1c MFr Bld  Date/Time Value Ref Range Status  02/16/2020 04:06 AM 8.6 (H) 4.8 - 5.6 % Final     Comment:    (NOTE) Pre diabetes:          5.7%-6.4% Diabetes:              >6.4% Glycemic control for   <7.0% adults with diabetes   11/07/2012 10:40 AM 7.4 (H) <5.7 % Final    Comment:    (NOTE)                                                                       According to the ADA Clinical Practice Recommendations for 2011, when HbA1c is used as a screening test:  >=6.5%   Diagnostic of Diabetes Mellitus           (if abnormal result  is confirmed) 5.7-6.4%   Increased risk of developing Diabetes Mellitus References:Diagnosis and Classification of Diabetes Mellitus,Diabetes JHER,7408,14(GYJEH 1):S62-S69 and Standards of Medical Care in         Diabetes - 2011,Diabetes UDJS,9702,63 (Suppl 1):S11-S61.    CBG: Recent Labs  Lab 08/03/21 0020 08/03/21 0457 08/03/21 0740 08/03/21 1226 08/03/21 1625  GLUCAP 148* 166* 157* 116* 94    Review of Systems:   Patient is encephalopathic and/or intubated. Therefore history has been obtained from chart review.   Past Medical History:  She,  has a past medical history of Diabetes mellitus without complication ([place]), Family history of brain cancer, Family history of breast cancer, Family history of kidney cancer, Family history of ovarian cancer, GERD (gastroesophageal reflux disease), Hyperlipidemia, Hypertension, Kidney stone, Pancreatic cancer ([place]) (02/03/2020), Shingles, and Type 2 diabetes mellitus ([place]).   Surgical History:   Past Surgical History:  Procedure Laterality Date   CESAREAN SECTION     x 4   IR IMAGING GUIDED PORT INSERTION  02/18/2020   KIDNEY STONE SURGERY       Social History:   reports that she has never smoked. She has never used smokeless tobacco. She reports that she does not drink alcohol and does not use drugs.   Family History:  Her family history includes Brain cancer (age of onset: 64) in her paternal uncle; Breast cancer in her cousin and paternal aunt; Cancer in her cousin; Heart attack in her  maternal uncle; Kidney cancer in her cousin; Lung cancer in her cousin; Ovarian cancer in her cousin and cousin.   Allergies Allergies  Allergen Reactions   Ivp Dye [Iodinated Diagnostic Agents] Itching and Rash   Morphine And Related Hives   Penicillins Rash     Home Medications  Prior to Admission medications   Medication Sig Start Date End Date Taking? Authorizing Provider  albuterol (PROVENTIL HFA;VENTOLIN HFA) 108 (90 Base) MCG/ACT inhaler Inhale 2 puffs into the lungs every 4 (four) hours as needed for wheezing or shortness of breath. 04/01/16  Yes Mabe, Shanon Brow, NP  dicyclomine (BENTYL) 10 MG capsule Take 10 mg by mouth daily as needed for spasms. 06/03/21  Yes [provider]  diphenoxylate-atropine (LOMOTIL) 2.5-0.025 MG tablet Take 2 tablets by mouth 4 (four) times daily as needed for diarrhea or loose stools. 06/23/21  Yes Volanda Napoleon, MD  fentaNYL (DURAGESIC) 75 MCG/HR Place 2 patches onto the skin every other day. Patient taking differently: Place 1-2 patches onto the skin every other day. 07/05/21  Yes Ennever, Rudell Cobb, MD  gabapentin (NEURONTIN) 300 MG capsule TAKE 2 CAPSULES BY MOUTH IN THE DAYTIME AND 2 CAPSULES AT BEDTIME Patient taking differently: Take 600 mg by mouth 3 (three) times daily. 12/08/20 12/08/21 Yes Ennever, Rudell Cobb, MD  HYDROcodone bit-homatropine (HYCODAN) 5-1.5 MG/5ML syrup Take 5 mLs by mouth every 6 (six) hours as needed for cough. 03/04/21  Yes Ennever, Rudell Cobb, MD  HYDROmorphone (DILAUDID) 4 MG tablet Take 1 tablet (4 mg total) by mouth every 4 (four) hours as needed for severe pain. 06/23/21  Yes Volanda Napoleon, MD  Lactulose 20 GM/30ML SOLN Take 30 mLs (20 g total) by mouth 3 (three) times daily as needed. Patient taking differently: Take 30 mLs by mouth 3 (three) times daily as needed (for constipation). 05/07/21  Yes Volanda Napoleon, MD  lidocaine-prilocaine (EMLA) cream APPLY TO PORT 1 HOUR BEFORE ACCESS. Patient taking differently: Apply 1  application topically See admin instructions. Apply to port 1  hour before access 02/17/21 02/17/22 Yes Ennever, Rudell Cobb, MD  LORazepam (ATIVAN) 0.5 MG tablet Take 0.5 mg every six hours as needed for nausea. 06/25/21  Yes Ennever, Rudell Cobb, MD  OLANZapine (ZYPREXA) 10 MG tablet Take 1 tablet (10 mg total) by mouth at bedtime. Take every day for nausea/vomiting 06/07/21  Yes Ennever, Rudell Cobb, MD  omeprazole (PRILOSEC) 40 MG capsule Take 40 mg by mouth daily as needed (acid reflux). 04/22/21  Yes [provider]  ondansetron (ZOFRAN) 4 MG tablet Take 1 tablet (4 mg total) by mouth every 6 (six) hours as needed for nausea. 07/13/21  Yes Celso Amy, NP  rivaroxaban (XARELTO) 20 MG TABS tablet Take 1 tablet (20 mg total) by mouth daily with supper. 05/12/21  Yes Volanda Napoleon, MD  amLODipine (NORVASC) 5 MG tablet Take 5 mg by mouth daily. Patient not taking: No sig reported 06/17/21   [provider]  cetirizine (ZYRTEC) 10 MG tablet Take 1 tablet (10 mg total) by mouth daily. Patient not taking: Reported on 07/06/2021 04/06/20   Jacelyn Pi, Lilia Argue, MD  dexamethasone (DECADRON) 4 MG tablet Take 1 tablet (4 mg total) by mouth 2 (two) times daily with a meal. For three (3) days following chemotherapy. 03/26/21   Volanda Napoleon, MD  dronabinol (MARINOL) 2.5 MG capsule Take 1 capsule (2.5 mg total) by mouth 2 (two) times daily before lunch and supper. 06/23/21   Volanda Napoleon, MD  ergocalciferol (VITAMIN D2) 1.25 MG (50000 UT) capsule Take 1 capsule (50,000 Units total) by mouth once a week. Patient not taking: Reported on 07/03/2021 10/07/20   Just, Laurita Quint, FNP  lisinopril (ZESTRIL) 10 MG tablet Take 10 mg by mouth daily. Patient not taking: Reported on 07/19/2021 06/17/21   [provider]  morphine (ROXANOL) 20 MG/ML concentrated solution Take 1 mL (20 mg total) by mouth every 3 (three) hours as needed for severe pain. 07/09/21   Volanda Napoleon, MD  polyethylene glycol  (MIRALAX / GLYCOLAX) 17 g packet Take 17 g by mouth daily as needed. Patient not taking: No sig reported 02/18/20   Alma Friendly, MD  prochlorperazine (COMPAZINE) 10 MG tablet Take 1 tablet (10 mg total) by mouth every 6 (six) hours as needed for nausea or vomiting. 05/27/21   Volanda Napoleon, MD  prochlorperazine (COMPAZINE) 25 MG suppository Unwrap and place 1 suppository (25 mg total) rectally every 8 (eight) hours as needed for nausea or vomiting. 07/05/21   Volanda Napoleon, MD  morphine 20 MG SUPP Place 1 suppository (20 mg total) rectally every 4 (four) hours as needed. 07/05/21   Volanda Napoleon, MD     Critical care time: 39 minutes     Georgann Housekeeper, AGACNP-BC [facility]  See Amion for personal pager PCCM on call pager [phone] until 7pm. Please call Elink 7p-7a. [phone]  08/03/2021 6:31 PM

## 2021-08-03 NOTE — Progress Notes (Signed)
eLink Physician-Brief Progress Note Patient Name: Angela Wilson DOB: Mar 29, 1966 MRN: [id number]   Date of Service  08/03/2021  HPI/Events of Note  Per  MD sign out notes, no further escalation of pressors.  eICU Interventions  Vasopressin discontinued. Will not ask PCCM ground  team to place a central line as the over all goal appears to be to not escalate care.        Kerry Kass [employer] 08/03/2021, 9:45 PM

## 2021-08-03 NOTE — TOC Progression Note (Signed)
Transition of Care [facility]) - Progression Note    Patient Details  Name: Charika Mikelson MRN: [id number] Date of Birth: December 17, 1965  [facility] CM/SW Contact  Floris Neuhaus, Juliann Pulse, RN Phone Number: 08/03/2021, 2:39 PM  Clinical Narrative: Noted ongoing discussions by [place] Team w/family. Currently full code.Medical oncology following. Noted poor prognosis. Continue to follow for CM needs.     Expected Discharge Plan: [place] Barriers to Discharge: Continued Medical Work up  Expected Discharge Plan and Services Expected Discharge Plan: [place]   Discharge Planning Services: CM Consult   Living arrangements for the past 2 months: Single Family Home                                       Social Determinants of Health (SDOH) Interventions    Readmission Risk Interventions Readmission Risk Prevention Plan 08/03/2021  Transportation Screening Complete  Medication Review Press photographer) Complete  PCP or Specialist appointment within 3-5 days of discharge Complete  HRI or [place] Complete  SW Recovery Care/Counseling Consult Complete  [place] Not Applicable  Some recent data might be hidden

## 2021-08-03 NOTE — Progress Notes (Signed)
eLink Physician-Brief Progress Note Patient Name: Angela Wilson DOB: [birth year] MRN: [id number]   Date of Service  08/03/2021  HPI/Events of Note  I discussed code status with patient's daughter.  eICU Interventions  Patient's daughter wants her to remain full code at this time.        Kerry Kass Vedder Brittian 08/03/2021, 10:10 PM

## 2021-08-03 NOTE — Progress Notes (Signed)
TPN stopped in order to move levophed gtt to port per MD. IV team notified

## 2021-08-03 NOTE — Progress Notes (Signed)
[place] Progress Note Patient Name: Angela Wilson DOB: Oct 06, 1965 MRN: [id number]   Date of Service  08/03/2021  HPI/Events of Note  Low blood pressure and oliguria.  eICU Interventions  Albumin 5 % 500 ml iv fluid bolus x 1, D 5 % LR gtt @ 75 ml / hour, Vasopressin gtt.        Kerry Kass Jeilani Grupe 08/03/2021, 7:54 PM

## 2021-08-03 NOTE — Progress Notes (Signed)
Assess for PICC placement by 2 PICC nurses but there are no appropriate veins to use. Rt arm has 433% cephalic vein occupancy, basilic vein not found and 54% brachial vein occupancy to catheter ratio. Lt arm has 100% brachial vein occupancy, cephalic vein not found,and 29% basilic vein occupancy to catheter ratio. RN made aware to notify MD.

## 2021-08-03 NOTE — Progress Notes (Signed)
PHARMACY - TOTAL PARENTERAL NUTRITION CONSULT NOTE   Indication: Prolonged ileus  Patient Measurements: Height: 5\' 6"  (167.6 cm) Weight: 92.5 kg (203 lb 14.8 oz) IBW/kg (Calculated) : 59.3 TPN AdjBW (KG): 69.6 Body mass index is 32.91 kg/m. Usual Weight:   Assessment: 32 yoF admitted on 10/27 with N/V, abdominal pain.  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  She has had poor PO intake, NPO since 11/5 d/t colonic ileus.  NG tube removed by pt on 11/7, replaced on 11/8.  She is high risk for re-feeding.  GOC:  Due to religious beliefs, patient and family continue to want full scope of care without consideration of scaling back treatment in efforts to focus on comfort.  Glucose / Insulin: Hx of diabetes, not on any medications PTA.  Most recent A1c 8.6% (01/2020) - CBGs 118-166, serum glu 178 - mSSI: 13 units/ 24 hrs - Insulin in TPN: 50 units (11/11) Electrolytes: K 4.5 (goal > 4), Mag 2.3 (goal >2), Phos 4.6. lytes on high end of range; others WNL including CorrCa Renal: SCr WNL.  BUN up to 89 Hepatic: WNL TG: 95 (11/14) Intake / Output; MIVF: UOP 100 ml;  NG removed 11/14 by pt - IVF per MD, adding NS @ 100 on 11/12 stopped 11/13.  Albumin x4 doses on 11/12.  Transfuse PRBC on 11/13 - Lasix 11/12, 11/13 GI Imaging:  11/5 CT: extensive peritoneal metastatic disease, large cystic pelvic masses, pancreatic tail tumor, and some heterogeneity in the liver suspicious for hepatic metastatic lesions.  Central access: Port TPN start date: 11/8  Nutritional Goals: Goal TPN rate is 90 mL/hr (provides 125 g of protein and 2104 kcals per day)  RD Assessment:   Estimated Needs Total Energy Estimated Needs: 2100-2300 kcal Total Protein Estimated Needs: 110-125 grams Total Fluid Estimated Needs: >/= 1.6 L/day  Current Nutrition:  NPO  Plan:   Continue TPN at goal rate 90 mL/hr Electrolytes in TPN: Na 11mEq/L, reduce K 35 mEq/L, Ca 35mEq/L, reduce Mg 4mEq/L, and reduce Phos 10 mmol/L. Cl:Ac max Ac Standard MVI and trace elements in TPN Continue resistant SSI q4h and adjust as needed  Continue regular insulin in TPN:  50 units Thiamine in TPN since 10/10, D#5 IVF per MD  (none currently) Monitor TPN labs on Mon/Thurs. BMET, Mg, and Phos in AM  Eudelia Bunch, Pharm.D 08/03/2021 8:09 AM

## 2021-08-03 NOTE — Progress Notes (Addendum)
HEMATOLOGY-ONCOLOGY PROGRESS NOTE  SUBJECTIVE: Had 2 bloody bowel movements overnight.  Lovenox has been placed on hold.  Daughter reports that her abdomen is more distended and she is having increased discomfort over her right upper quadrant this morning.  Oncology History  Pancreatic cancer ([place])  02/14/2020 Initial Diagnosis   Pancreatic cancer ()   02/25/2020 - 11/10/2020 Chemotherapy          03/24/2020 Genetic Testing   Negative genetic testing:  No pathogenic variants detected on the [facility] Common Hereditary Cancers Panel + Pancreatic Cancer Panel + Pancreatitis Genes. Two variants of uncertain significance (VUS) were detected - one in the AXIN2 gene called c.1855G>A and a second in the MUTYH gene called c.932G>A. The report date is 03/24/2020.  The Common Hereditary Cancers Panel offered by [facility] includes sequencing and/or deletion duplication testing of the following 48 genes: APC, ATM, AXIN2, BARD1, BMPR1A, BRCA1, BRCA2, BRIP1, CDH1, CDK4, CDKN2A (p14ARF), CDKN2A (p16INK4a), CHEK2, CTNNA1, DICER1, EPCAM (Deletion/duplication testing only), GREM1 (promoter region deletion/duplication testing only), KIT, MEN1, MLH1, MSH2, MSH3, MSH6, MUTYH, NBN, NF1, NTHL1, PALB2, PDGFRA, PMS2, POLD1, POLE, PTEN, RAD50, RAD51C, RAD51D, RNF43, SDHB, SDHC, SDHD, SMAD4, SMARCA4. STK11, TP53, TSC1, TSC2, and VHL.  The following genes were evaluated for sequence changes only: SDHA and HOXB13 c.251G>A variant only.  The [facility] Pancreatic Cancer Panel with Chronic Pancreatitis Genes includes sequencing and/or deletion duplication analysis of the following 26 genes: APC, ATM, BMPR1A, BRCA1, BRCA2, CDKN2A, EPCAM, MEN1, MLH1, MSH2, MSH6, NF1, PALB2, PMS2, SMAD4, STK11, TP53, TSC1, TSC2, VHL, CASR, CFTR, CPA1, CTRC, PRSS1, and SPINK1. The [facility] and PALLD genes were also analyzed.   11/25/2020 - 01/27/2021 Chemotherapy          02/17/2021 - 02/17/2021 Chemotherapy          02/17/2021 - 02/19/2021 Chemotherapy           03/04/2021 - 04/15/2021 Chemotherapy          04/27/2021 - 06/02/2021 Chemotherapy   Patient is on Treatment Plan : PANCREAS 5-FU F6,3,84,66,59,93 q56d     07/13/2021 -  Chemotherapy   Patient is on Treatment Plan : PANCREAS Liposomal Irinotecan / Leucovorin / 5-FU  q14d      PHYSICAL EXAMINATION:  Vitals:   08/03/21 0300 08/03/21 0643  BP: 125/89 121/74  Pulse:  (!) 115  Resp:  20  Temp: 98 F (36.7 C) 97.6 F (36.4 C)  SpO2: 98% 100%   Filed Weights   07/28/21 0500 07/31/21 0500 08/01/21 0500  Weight: 94.1 kg 92.4 kg 92.5 kg    Intake/Output from previous day: 11/14 0701 - 11/15 0700 In: 840.2 [I.V.:840.2] Out: 100 [Urine:100]  GENERAL: Alert, no distress SKIN: skin color, texture, turgor are normal, no rashes or significant lesions EYES: normal, Conjunctiva are pink and non-injected, sclera clear OROPHARYNX:no exudate, no erythema and lips, buccal mucosa, and tongue normal  LUNGS: clear to auscultation and percussion with normal breathing effort HEART: Slightly tachycardic, 1-2+ bilateral lower extremity edema ABDOMEN: Markedly distended, tenderness over right upper quadrant NEURO: alert, no focal motor/sensory deficits  LABORATORY DATA:  I have reviewed the data as listed CMP Latest Ref Rng & Units 08/03/2021 08/02/2021 08/01/2021  Glucose 70 - 99 mg/dL 178(H) 120(H) 138(H)  BUN 6 - 20 mg/dL 89(H) 77(H) 62(H)  Creatinine 0.44 - 1.00 mg/dL 0.60 0.51 0.45  Sodium 135 - 145 mmol/L 141 140 141  Potassium 3.5 - 5.1 mmol/L 4.5 4.0 3.9  Chloride 98 - 111 mmol/L 113(H) 111 112(H)  CO2  22 - 32 mmol/L 19(L) 23 23  Calcium 8.9 - 10.3 mg/dL 8.3(L) 8.2(L) 8.0(L)  Total Protein 6.5 - 8.1 g/dL 6.2(L) 6.1(L) 5.9(L)  Total Bilirubin 0.3 - 1.2 mg/dL 0.8 1.0 1.1  Alkaline Phos 38 - 126 U/L 61 56 41  AST 15 - 41 U/L 14(L) 14(L) 14(L)  ALT 0 - 44 U/L _0 Lab Results  Component Value Date   WBC 4.6 08/03/2021   HGB 8.5 (L) 08/03/2021   HCT 27.7 (L) 08/03/2021    MCV 86.8 08/03/2021   PLT 236 08/03/2021   NEUTROABS 3.4 08/03/2021    CT ABDOMEN PELVIS WO CONTRAST  Result Date: 07/24/2021 CLINICAL DATA:  Abdominal distension. Metastatic pancreatic carcinoma. IV contrast allergy. Vomiting and diarrhea. EXAM: CT ABDOMEN AND PELVIS WITHOUT CONTRAST TECHNIQUE: Multidetector CT imaging of the abdomen and pelvis was performed following the standard protocol without IV contrast. COMPARISON:  07/19/2021 FINDINGS: Lower chest: Atelectasis in the left lower lobe, right middle lobe, and right lower lobe. Large right pleural effusion. Small stable pericardial effusion. Central catheter tip terminates in the right atrium. Anterior pericardial node 0.6 cm in short axis on image 8 series 2. Hepatobiliary: Heterogeneity in the liver probably reflecting metastatic lesions. Nonvisualization of the gallbladder, presumed surgically absent. Pancreas: Solid mass of the pancreatic tail 4.6 by 2.9 cm, roughly stable. Spleen: Unremarkable Adrenals/Urinary Tract: Punctate bilateral nonobstructive renal calculi. Adrenal glands unremarkable. Stomach/Bowel: Frothy fluid levels in the distal colon and rectum with diarrheal process. Dilated colon down to the level of the sigmoid, there is some adjacent nodularity but a definite cause for the transition is not observed no substantially dilated small bowel currently. Vascular/Lymphatic: Patency not assessed due to lack of IV contrast. No periaortic adenopathy is identified. There appears to be a trace amount of gas in a likely venous structure tracking along the anterior omentum on image 41 series 2, this could be related to gas from an IV as I do not demonstrate other classic hallmarks of portal venous gas along more characteristic locations for example in the mesenteric bowel. Reproductive: Uterus appears mildly flattened anteriorly, with large conglomerate pelvic cystic mass involving both adnexa measuring about 15.6 by 11.6 cm on image 66 of  series 2, roughly similar to prior. Other: Substantial ascites similar to prior with mesenteric edema and extensive scattered nodularity along the mesentery and omentum compatible peritoneal tumor. 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 IMPRESSION: 1. Trace gas in tiny venous structures to the left of the stomach and also along the anterior omentum. Although not a typical distribution for portal venous gas, there is also dilated colon up to the level of the sigmoid colon (without visible pneumatosis). Correlate with lactate levels in assessing for the possibility of ischemic bowel. 2. Generally similar appearance of large right effusion, substantial ascites, extensive peritoneal metastatic disease, large cystic pelvic masses, pancreatic tail tumor, and some heterogeneity in the liver suspicious for hepatic metastatic lesions. 3. Bilateral punctate nonobstructive nephrolithiasis. 4. Atelectasis in the left lower lobe, right middle lobe, and right lower lobe. 5. Stable small pericardial effusion. Critical Value/emergent results were called by telephone at the time of interpretation on 07/24/2021 at 6:45 pm to provider Dr. Verlee Monte, who verbally acknowledged these results. Electronically Signed   By: Van Clines M.D.   On: 07/24/2021 18:46   CT ABDOMEN PELVIS WO CONTRAST  Result Date: 06/26/2021 CLINICAL DATA:  History of pancreatic cancer.  Abdominal distention.  EXAM: CT ABDOMEN AND PELVIS WITHOUT CONTRAST TECHNIQUE: Multidetector CT imaging of the abdomen and pelvis was performed following the standard protocol without IV contrast. COMPARISON:  April 21, 2021. FINDINGS: Lower chest: Increased right pleural effusion is noted with associated atelectasis of the right lower lobe. Hepatobiliary: Heterogeneous appearance of hepatic parenchyma is noted suggesting diffuse metastatic disease as noted on prior exam. Some degree of hepatic steatosis may be  present as well. Status post cholecystectomy. No definite biliary dilatation is noted. Pancreas: Solid mass is seen involving pancreatic tail measuring 4.8 x 2.6 cm consistent with history of pancreatic adenocarcinoma. No definite ductal dilatation is noted. Spleen: Normal in size without focal abnormality. Adrenals/Urinary Tract: Adrenal glands appear normal. Nonobstructive left nephrolithiasis is noted. No hydronephrosis or renal obstruction is noted. Urinary bladder is decompressed. Stomach/Bowel: The stomach appears normal. There is no evidence of bowel obstruction or inflammation. Vascular/Lymphatic: No significant vascular findings are present. No enlarged abdominal or pelvic lymph nodes. Reproductive: Uterus is unremarkable. 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 These are significantly enlarged compared to prior exam. Overall they measure 15.5 x 11.0 cm, and it is difficult to distinguish right from left. Other: There is interval development of mild to moderate ascites in the pelvis and around the liver and spleen. 7.9 x 5.0 cm mass is seen along the anterior abdominal wall in the region of the umbilicus which is enlarged compared to prior exam and consistent with worsening peritoneal implant. Musculoskeletal: Grade 1 anterolisthesis of L4-5 is noted secondary to bilateral L5 spondylolysis. No acute osseous abnormality is noted. IMPRESSION: 4.8 x 2.6 cm solid discrete mass is seen involving pancreatic tail consistent with history of pancreatic adenocarcinoma. Mild to moderate ascites is noted both in the pelvis and around the liver and spleen in the upper abdomen. 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 This is significantly enlarged compared to prior exam. Significantly enlarged peritoneal implant is seen along the anterior  abdominal wall in the periumbilical region consistent with peritoneal carcinomatosis. Heterogeneous appearance of hepatic parenchyma is noted consistent with multiple metastatic lesions. Some degree of hepatic steatosis may be present as well. Increased right pleural effusion is noted with associated atelectasis of the right lower lobe. Electronically Signed   By: Marijo Conception M.D.   On: 07/11/2021 14:56   DG Chest 1 View  Result Date: 07/24/2021 CLINICAL DATA:  Abdominal pain, nausea and vomiting, pancreatic cancer EXAM: CHEST  1 VIEW COMPARISON:  07/17/2021 chest radiograph. FINDINGS: Right internal jugular Port-A-Cath terminates over the right atrium, stable. Stable cardiomediastinal silhouette with normal heart size. No pneumothorax. Small to moderate right and small left pleural effusions, slightly increased. No overt pulmonary edema. Hazy bibasilar lung opacities, mildly worsened, favor atelectasis. IMPRESSION: Small to moderate right and small left pleural effusions, slightly increased. Hazy bibasilar lung opacities, mildly worsened, favor atelectasis. Electronically Signed   By: Ilona Sorrel M.D.   On: 07/24/2021 11:26   DG Chest 2 View  Result Date: 07/18/2021 CLINICAL DATA:  Suspected sepsis EXAM: CHEST - 2 VIEW COMPARISON:  04/08/2020 FINDINGS: Low volume examination. Small right pleural effusion associated atelectasis or consolidation. The left lung is normally aerated. Cardiomegaly. Right chest port catheter. IMPRESSION: Low volume examination. Small right pleural effusion and associated atelectasis or consolidation. Electronically Signed   By: Delanna Ahmadi M.D.   On: 06/28/2021 14:37  DG Cervical Spine 2 or 3 views  Result Date: 07/21/2021 CLINICAL DATA:  Pancreatic cancer with metastases. EXAM: CERVICAL SPINE - 2-3 VIEW COMPARISON:  None. FINDINGS: Lateral view includes skull base through mid C6. There is straightening of normal cervical lordosis. There is no evidence for  subluxation. Disc spaces are maintained. There is no acute fracture. No focal osseous lesions are identified. There is no prevertebral soft tissue swelling. Catheter is partially visualized overlying the right chest. IMPRESSION: Negative cervical spine radiographs. Electronically Signed   By: Ronney Asters M.D.   On: 07/21/2021 16:18   DG Abd 1 View  Result Date: 08/01/2021 CLINICAL DATA:  55 year old female with bowel obstruction. EXAM: ABDOMEN - 1 VIEW COMPARISON:  07/24/2021, 07/29/2021, 07/30/2021 FINDINGS: Similar appearing gaseous distension of the transverse colon measuring up to approximately 9 cm near the hepatic flexure. Similar appearing of gastric decompression tube with the tip in the stomach. No significant small bowel gaseous distension. Relative paucity of distal colonic gas. IMPRESSION: Similar appearing colonic obstruction versus ileus. Electronically Signed   By: Ruthann Cancer M.D.   On: 08/01/2021 09:46   DG Abd 1 View  Result Date: 07/30/2021 CLINICAL DATA:  Follow-up ileus EXAM: ABDOMEN - 1 VIEW COMPARISON:  Abdominal x-ray 07/29/2021 FINDINGS: Enteric tube tip is in the stomach. Persistent distended loops of bowel across the mid abdomen which measure up to approximately 9.2 cm in diameter, mildly improved since previous study. No suspicious calcifications visualized. IMPRESSION: Persistent but mildly improved distended loops of bowel. Electronically Signed   By: Ofilia Neas M.D.   On: 07/30/2021 09:11   DG Abd 1 View  Result Date: 07/29/2021 CLINICAL DATA:  Follow-up ileus EXAM: ABDOMEN - 1 VIEW COMPARISON:  Multiple prior radiographs including most recent dated July 27, 2021 from FINDINGS: NG tube with distal tip overlying the stomach. Gaseous dilated colonic loop in the right abdomen measuring up to 11 cm, unchanged. There also dilated bowel lobes in the mid abdomen and left lower quadrant. IMPRESSION: 1. No significant interval change and multiple distended bowel  loops concerning for ileus/obstruction. 2. NG tube with side port and distal tip overlying the gastric body. Electronically Signed   By: Keane Police D.O.   On: 07/29/2021 09:12   DG Abd 1 View  Result Date: 07/27/2021 CLINICAL DATA:  NG tube placement EXAM: ABDOMEN - 1 VIEW COMPARISON:  None. FINDINGS: Nasogastric tube with the tip projecting over the stomach. Gaseous distension the small bowel and colon. No evidence of pneumoperitoneum, portal venous gas or pneumatosis. No pathologic calcifications along the expected course of the ureters. No acute osseous abnormality. IMPRESSION: Nasogastric tube with the tip projecting over the stomach. Electronically Signed   By: Kathreen Devoid M.D.   On: 07/27/2021 11:22   DG Abd 1 View  Result Date: 07/27/2021 CLINICAL DATA:  Abdominal distention, vomiting EXAM: ABDOMEN - 1 VIEW COMPARISON:  07/26/2021 FINDINGS: There are dilated bowel loops in the mid abdomen measuring up to 11.5 cm in maximum diameter. This finding is not changed significantly. There is haziness in the abdomen and pelvis, possibly suggesting ascites. IMPRESSION: There is dilation of bowel loops in mid abdomen suggesting ileus or partial obstruction. No significant interval changes are noted. Electronically Signed   By: Elmer Picker M.D.   On: 07/27/2021 08:06   DG Abd 1 View  Result Date: 07/25/2021 CLINICAL DATA:  Check gastric catheter placement EXAM: ABDOMEN - 1 VIEW COMPARISON:  None. FINDINGS: Gastric catheter is noted within the stomach.  Scattered large and small bowel gas is noted similar to that seen on the prior exam. No free air is seen. IMPRESSION: Gastric catheter in the stomach. Findings suggestive of ileus. Electronically Signed   By: Inez Catalina M.D.   On: 07/25/2021 00:00   DG Abd 1 View  Result Date: 07/24/2021 CLINICAL DATA:  Abdominal pain, nausea and vomiting EXAM: ABDOMEN - 1 VIEW COMPARISON:  07/21/2021 abdominal radiographs FINDINGS: No disproportionately dilated  small bowel loops. Prominent gaseous distension of the colon, most prominent in the transverse colon, worsened. No evidence of pneumatosis or pneumoperitoneum. No radiopaque nephrolithiasis. IMPRESSION: Prominent gaseous distension of the colon, most prominent in the transverse colon, worsened. Findings are suggestive of colonic ileus. No disproportionately dilated small bowel loops to suggest small bowel obstruction. Electronically Signed   By: Ilona Sorrel M.D.   On: 07/24/2021 11:25   DG Abd 1 View  Result Date: 07/21/2021 CLINICAL DATA:  Distended abdomen. EXAM: ABDOMEN - 1 VIEW COMPARISON:  None. FINDINGS: The bowel gas pattern is normal. There is a calcification in the left hemipelvis, likely a phlebolith. No acute fractures are seen. IMPRESSION: Negative. Electronically Signed   By: Ronney Asters M.D.   On: 07/21/2021 16:58   US Venous Img Lower Bilateral (DVT)  Result Date: 07/02/2021 CLINICAL DATA:  Bilateral lower extremity edema and history of pancreatic cancer. EXAM: BILATERAL LOWER EXTREMITY VENOUS DOPPLER ULTRASOUND TECHNIQUE: Gray-scale sonography with graded compression, as well as color Doppler and duplex ultrasound were performed to evaluate the lower extremity deep venous systems from the level of the common femoral vein and including the common femoral, femoral, profunda femoral, popliteal and calf veins including the posterior tibial, peroneal and gastrocnemius veins when visible. The superficial great saphenous vein was also interrogated. Spectral Doppler was utilized to evaluate flow at rest and with distal augmentation maneuvers in the common femoral, femoral and popliteal veins. COMPARISON:  None. FINDINGS: RIGHT LOWER EXTREMITY Common Femoral Vein: No evidence of thrombus. Normal compressibility, respiratory phasicity and response to augmentation. Saphenofemoral Junction: No evidence of thrombus. Normal compressibility and flow on color Doppler imaging. Profunda Femoral Vein: No  evidence of thrombus. Normal compressibility and flow on color Doppler imaging. Femoral Vein: No evidence of thrombus. Normal compressibility, respiratory phasicity and response to augmentation. Popliteal Vein: No evidence of thrombus. Normal compressibility, respiratory phasicity and response to augmentation. Calf Veins: No evidence of thrombus. Normal compressibility and flow on color Doppler imaging. Superficial Great Saphenous Vein: No evidence of thrombus. Normal compressibility. Venous Reflux:  None. Other Findings: Baker's cyst within the popliteal fossa measures 3.8 by 1.1 x 2.3 cm. LEFT LOWER EXTREMITY Common Femoral Vein: No evidence of thrombus. Normal compressibility, respiratory phasicity and response to augmentation. Saphenofemoral Junction: No evidence of thrombus. Normal compressibility and flow on color Doppler imaging. Profunda Femoral Vein: No evidence of thrombus. Normal compressibility and flow on color Doppler imaging. Femoral Vein: No evidence of thrombus. Normal compressibility, respiratory phasicity and response to augmentation. Popliteal Vein: No evidence of thrombus. Normal compressibility, respiratory phasicity and response to augmentation. Calf Veins: No evidence of thrombus. Normal compressibility and flow on color Doppler imaging. Superficial Great Saphenous Vein: No evidence of thrombus. Normal compressibility. Venous Reflux:  None. Other Findings: Complex fluid collection within the popliteal fossa measures 3.4 x 1.2 x 1.7 cm containing diffuse internal echoes and areas of calcification. IMPRESSION: 1. No evidence of deep venous thrombosis in either lower extremity. 2. Right-sided Baker's cyst. 3. Complex fluid collection within the left popliteal fossa  containing internal echoes and calcification. Favor complex Baker's cyst with internal debris and calcification. Electronically Signed   By: Kerby Moors M.D.   On: 07/09/2021 15:04   US Paracentesis  Result Date:  07/26/2021 INDICATION: Patient with history of pancreatic cancer, recurrent ascites. Request to IR for diagnostic and therapeutic paracentesis. EXAM: ULTRASOUND GUIDED DIAGNOSTIC AND THERAPEUTIC PARACENTESIS MEDICATIONS: 10 mL 1% lidocaine COMPLICATIONS: None immediate. PROCEDURE: Informed written consent was obtained from the patient after a discussion of the risks, benefits and alternatives to treatment. A timeout was performed prior to the initiation of the procedure. Initial ultrasound scanning demonstrates a moderate amount of ascites within the left lower abdominal quadrant. The left lower abdomen was prepped and draped in the usual sterile fashion. 1% lidocaine was used for local anesthesia. Following this, a 19 gauge, 7-cm, Yueh catheter was introduced. An ultrasound image was saved for documentation purposes. The paracentesis was performed. The catheter was removed and a dressing was applied. The patient tolerated the procedure well without immediate post procedural complication. FINDINGS: A total of approximately 2.9 L of hazy yellow fluid was removed. Samples were sent to the laboratory as requested by the clinical team. IMPRESSION: Successful ultrasound-guided paracentesis yielding 2.9 liters of peritoneal fluid. Read by Candiss Norse, PA-C Electronically Signed   By: Michaelle Birks M.D.   On: 07/26/2021 18:32   US Paracentesis  Result Date: 07/17/2021 INDICATION: Patient with history of pancreatic cancer, recurrent ascites. Request received for therapeutic paracentesis. EXAM: ULTRASOUND GUIDED THERAPEUTIC PARACENTESIS MEDICATIONS: 10 mL 1% lidocaine COMPLICATIONS: None immediate. PROCEDURE: Informed written consent was obtained from the patient after a discussion of the risks, benefits and alternatives to treatment. A timeout was performed prior to the initiation of the procedure. Initial ultrasound scanning demonstrates a moderate amount of ascites within the left upper abdominal quadrant. The  left upper abdomen was prepped and draped in the usual sterile fashion. 1% lidocaine was used for local anesthesia. Following this, a 19 gauge, 10-cm, Yueh catheter was introduced. An ultrasound image was saved for documentation purposes. The paracentesis was performed. The catheter was removed and a dressing was applied. The patient tolerated the procedure well without immediate post procedural complication. FINDINGS: A total of approximately 3.4 liters of yellow fluid was removed. IMPRESSION: Successful ultrasound-guided therapeutic paracentesis yielding 3.4 liters of peritoneal fluid. Read by: Rowe Robert, PA-C Electronically Signed   By: Jacqulynn Cadet M.D.   On: 07/17/2021 09:37   US Paracentesis  Result Date: 07/09/2021 INDICATION: History of pancreatic cancer. Abdominal discomfort with ascites. Request for therapeutic paracentesis. EXAM: ULTRASOUND GUIDED LEFT LOWER QUADRANT PARACENTESIS MEDICATIONS: 1% plain lidocaine, 5 mL COMPLICATIONS: None immediate. PROCEDURE: Informed written consent was obtained from the patient after a discussion of the risks, benefits and alternatives to treatment. A timeout was performed prior to the initiation of the procedure. Initial ultrasound scanning demonstrates a moderate amount of ascites within the left lower abdominal quadrant. Ascites is noted to be partially loculated. The left lower abdomen was prepped and draped in the usual sterile fashion. 1% lidocaine was used for local anesthesia. Following this, a 19 gauge, 10-cm, Yueh catheter was introduced. An ultrasound image was saved for documentation purposes. The paracentesis was performed. The catheter was removed and a dressing was applied. The patient tolerated the procedure well without immediate post procedural complication. FINDINGS: A total of approximately 2.8 L of clear yellow fluid was removed. IMPRESSION: Successful ultrasound-guided paracentesis yielding 2.8 liters of peritoneal fluid. Read by: Ascencion Dike PA-C Electronically Signed  By: Sandi Mariscal M.D.   On: 07/09/2021 16:09   DG CHEST PORT 1 VIEW  Result Date: 07/27/2021 CLINICAL DATA:  Pleural effusion. Additional history provided: Pleural effusions status post thoracentesis. EXAM: PORTABLE CHEST 1 VIEW COMPARISON:  Prior chest radiographs 07/24/2021 and earlier. FINDINGS: Right chest infusion port catheter with tip projecting at the level of the superior cavoatrial junction. An enteric tube is present, extending below the level of the left hemidiaphragm with tip excluded from the field of view. The cardiomediastinal silhouette is unchanged. No significant right pleural effusion is appreciated status post thoracentesis. A small left pleural effusion is unchanged. Mild bibasilar atelectasis. No evidence of pneumothorax. No acute bony abnormality identified. IMPRESSION: No significant residual right pleural effusion is appreciated status post thoracentesis. No evidence of pneumothorax. Persistent small left pleural effusion. Mild bibasilar atelectasis. Electronically Signed   By: Kellie Simmering D.O.   On: 07/27/2021 13:54   DG Abd Portable 1V  Result Date: 07/26/2021 CLINICAL DATA:  Follow-up ileus EXAM: PORTABLE ABDOMEN - 1 VIEW COMPARISON:  Abdominal x-ray 07/24/2021 FINDINGS: Enteric tube tip is in the stomach. Redemonstration of abnormally distended gas-filled loops of bowel mostly in the mid abdomen which measure up to 8.4 cm in diameter and appear mildly increased diameter since previous study. Paucity of bowel gas in the pelvis. No suspicious calcifications visualized. IMPRESSION: Mildly increased distension of abnormal gas-filled loops of bowel in the mid abdomen since previous study. May represent obstruction or ileus. Electronically Signed   By: Ofilia Neas M.D.   On: 07/26/2021 08:30   [place] ASCITES (ABDOMEN LIMITED)  Result Date: 07/21/2021 CLINICAL DATA:  55 year old female with history of pancreatic cancer and ascites. EXAM:  LIMITED ABDOMEN ULTRASOUND FOR ASCITES TECHNIQUE: Limited ultrasound survey for ascites was performed in all four abdominal quadrants. COMPARISON:  07/16/2021 FINDINGS: Trace ascites in the right upper, left upper, and right lower quadrants. No significant ascites in the left lower quadrant. IMPRESSION: Trace ascites.  No safe window with for paracentesis at this time. Ruthann Cancer, MD [facility] Radiology Electronically Signed   By: Ruthann Cancer M.D.   On: 07/21/2021 16:10   VAS [place] UPPER EXTREMITY VENOUS DUPLEX  Result Date: 07/20/2021 UPPER VENOUS STUDY  Patient Name:  BRIENNA BASS  Date of Exam:   07/20/2021 Medical Rec #: [id number]          Accession #:    [id number] Date of Birth: [birth year]           Patient Gender: F Patient Age:   89 years Exam Location:  [facility] Procedure:      VAS [place] UPPER EXTREMITY VENOUS DUPLEX Referring Phys: Burney Gauze --------------------------------------------------------------------------------  Indications: Pain Risk Factors: Cancer. Comparison Study: 04/20/2021 - R IJV, and Innominate vein DVT Performing Technologist: Oliver Hum RVT  Examination Guidelines: A complete evaluation includes B-mode imaging, spectral Doppler, color Doppler, and power Doppler as needed of all accessible portions of each vessel. Bilateral testing is considered an integral part of a complete examination. Limited examinations for reoccurring indications may be performed as noted.  Right Findings: +----------+------------+---------+-----------+----------+-------+ RIGHT     CompressiblePhasicitySpontaneousPropertiesSummary +----------+------------+---------+-----------+----------+-------+ IJV           None       No        No               Chronic +----------+------------+---------+-----------+----------+-------+ Subclavian    Full       Yes  Yes                       +----------+------------+---------+-----------+----------+-------+ Axillary      Full       Yes       Yes                      +----------+------------+---------+-----------+----------+-------+ Brachial      Full       Yes       Yes                      +----------+------------+---------+-----------+----------+-------+ Radial        Full                                          +----------+------------+---------+-----------+----------+-------+ Ulnar         Full                                          +----------+------------+---------+-----------+----------+-------+ Cephalic      Full                                          +----------+------------+---------+-----------+----------+-------+ Basilic       Full                                          +----------+------------+---------+-----------+----------+-------+  Left Findings: +----------+------------+---------+-----------+----------+-------+ LEFT      CompressiblePhasicitySpontaneousPropertiesSummary +----------+------------+---------+-----------+----------+-------+ Subclavian    Full       Yes       Yes                      +----------+------------+---------+-----------+----------+-------+  Summary:  Right: No evidence of superficial vein thrombosis in the upper extremity. 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  Left: No evidence of thrombosis in the subclavian.  *See table(s) above for measurements and observations.  Diagnosing physician: Deitra Mayo MD Electronically signed by Deitra Mayo MD on 07/20/2021 at 5:08:16 PM.    Final    ECHOCARDIOGRAM LIMITED  Result Date: 07/23/2021    ECHOCARDIOGRAM LIMITED REPORT   Patient Name:   MEHAR SAGEN Date of Exam: 07/23/2021 Medical Rec #:  [id number]         Height:       66.0 in Accession #:    [id number]        Weight:       211.0 lb Date of Birth:  July 24, 1966          BSA:           2.046 m Patient Age:    66 years          BP:           127/83 mmHg Patient Gender: F                 HR:           113 bpm. Exam Location:  Inpatient Procedure: Limited Echo, Color Doppler and Cardiac  Doppler Indications:    R94.31 Abnormal EKG  History:        Patient has no prior history of Echocardiogram examinations.                 Risk Factors:Hypertension, Diabetes, Dyslipidemia and Stage IV                 Pancreatic Cancer.  Sonographer:    Raquel Sarna Senior RDCS Referring Phys: [id number] Noland Fordyce MATHEWS  Sonographer Comments: Technically difficult study due to poor echo windows, suboptimal parasternal window, suboptimal apical window and suboptimal subcostal window. IMPRESSIONS  1. Limited study with very difficult windows for TTE. Grossly LV function appears preserved 60-65% but cannot assess for WMA. RV not well visualized.  2. Left ventricular ejection fraction, by estimation, is 60 to 65%. The left ventricle has normal function. Left ventricular endocardial border not optimally defined to evaluate regional wall motion.  3. Right ventricular systolic function was not well visualized. The right ventricular size is not well visualized. There is normal pulmonary artery systolic pressure.  4. The mitral valve was not well visualized.  5. The aortic valve was not well visualized.  6. The inferior vena cava is normal in size with <50% respiratory variability, suggesting right atrial pressure of 8 mmHg. FINDINGS  Left Ventricle: Left ventricular ejection fraction, by estimation, is 60 to 65%. The left ventricle has normal function. Left ventricular endocardial border not optimally defined to evaluate regional wall motion. Right Ventricle: The right ventricular size is not well visualized. Right vetricular wall thickness was not well visualized. Right ventricular systolic function was not well visualized. There is normal pulmonary artery systolic pressure. The tricuspid regurgitant velocity is 2.57 m/s, and  with an assumed right atrial pressure of 8 mmHg, the estimated right ventricular systolic pressure is 31.4 mmHg. Left Atrium: Left atrial size was not well visualized. Right Atrium: Right atrial size was not well visualized. Pericardium: There is no evidence of pericardial effusion. Presence of pericardial fat pad. Mitral Valve: The mitral valve was not well visualized. Tricuspid Valve: The tricuspid valve is grossly normal. Tricuspid valve regurgitation is mild . No evidence of tricuspid stenosis. Aortic Valve: The aortic valve was not well visualized. Pulmonic Valve: The pulmonic valve was not well visualized. Pulmonic valve regurgitation is not visualized. No evidence of pulmonic stenosis. Aorta: The aortic root is normal in size and structure. Venous: The inferior vena cava is normal in size with less than 50% respiratory variability, suggesting right atrial pressure of 8 mmHg. AORTIC VALVE LVOT Vmax:   87.40 cm/s LVOT Vmean:  57.400 cm/s LVOT VTI:    0.127 m TRICUSPID VALVE TR Peak grad:   26.4 mmHg TR Vmax:        257.00 cm/s  SHUNTS Systemic VTI: 0.13 m Eleonore Chiquito MD Electronically signed by Eleonore Chiquito MD Signature Date/Time: 07/23/2021/1:48:15 PM    Final    US THORACENTESIS ASP PLEURAL SPACE W/IMG GUIDE  Result Date: 07/27/2021 INDICATION: History of pancreatic cancer, shortness of breast, pleural effusion seen on previous chest x-ray. Request for therapeutic and diagnostic thoracentesis. EXAM: ULTRASOUND GUIDED RIGHT THORACENTESIS MEDICATIONS: 10 mL 1% lidocaine COMPLICATIONS: None immediate. PROCEDURE: An ultrasound guided thoracentesis was thoroughly discussed with the patient and questions answered. The benefits, risks, alternatives and complications were also discussed. The patient understands and wishes to proceed with the procedure. Written consent was obtained. Ultrasound was performed to localize and mark an adequate pocket of fluid in the right chest. The area was  then prepped and draped  in the normal sterile fashion. 1% Lidocaine was used for local anesthesia. Under ultrasound guidance a 6 Fr Safe-T-Centesis catheter was introduced. Thoracentesis was performed. The catheter was removed and a dressing applied. FINDINGS: A total of approximately 600 cc of hazy amber fluid was removed. Samples were sent to the laboratory as requested by the clinical team. Post procedure chest X-ray reviewed, negative for pneumothorax. IMPRESSION: Successful ultrasound guided right thoracentesis yielding 600 cc of pleural fluid. Read by: Durenda Guthrie, PA-C Electronically Signed   By: Ruthann Cancer M.D.   On: 07/27/2021 14:52    ASSESSMENT AND PLAN: 1.  Metastatic pancreatic adenocarcinoma 2.  Malignant ascites 3.  Lower extremity edema 4.  Acute respiratory failure with hypoxia 5.  Right pleural effusion 6.  Right IJ thrombus 7.  Hyponatremia 8.  Type 2 diabetes mellitus 9.  Hypertension 10.  Colonic ileus 11.  Protein calorie malnutrition 12.  Anemia due to chronic disease  [name] appears stable.  She developed bloody stools overnight but none so far this morning.  Lovenox has been placed on hold.  Her hemoglobin has remained stable.  She is currently receiving TPN for nutrition.  The patient has metastatic pancreatic cancer has had multiple lines of therapy.  We have discussed the incurable nature of her disease and declining performance status.  At this point, she is not a candidate for additional chemotherapy.  We have discussed CODE STATUS and shifting focus to comfort measures and referral to hospice.  The patient and her family wish for her to remain a full code and want to continue aggressive measures.  A CA125 and CEA were checked and CEA is stable.  CA125 has declined but difficult to interpret this number as it would reflect the amount of ascites present.  She has increased abdominal distention and right upper quadrant discomfort this morning.  Recommend therapeutic  ultrasound-guided paracentesis.  Recommendations: 1.  Ultrasound-guided paracentesis 2.  Continue TPN 3.  Agree with holding Lovenox in the setting of hematochezia 4.  Palliative care continues to follow.  No future appointments.    LOS: 19 days   Mikey Bussing, [place], AGPCNP-BC, AOCNP 08/03/21 Ms. Shuler was examined.  Her daughter was at the bedside.  She has increased abdominal distention and pain.  I agree with holding Lovenox given the recent rectal bleeding.  I explained the pain is likely related to carcinomatosis and ascites.  We will refer her for a palliative paracentesis.  Her daughter says they do not wish to pursue hospice care.  I was present for greater than 50% of today's visit.  I performed medical decision making.

## 2021-08-04 ENCOUNTER — Inpatient Hospital Stay (HOSPITAL_COMMUNITY): Payer: 59 | Admitting: Anesthesiology

## 2021-08-04 ENCOUNTER — Inpatient Hospital Stay (HOSPITAL_COMMUNITY): Payer: 59

## 2021-08-04 LAB — BLOOD GAS, ARTERIAL
Acid-base deficit: 21.9 mmol/L — ABNORMAL HIGH (ref 0.0–2.0)
Bicarbonate: 6.8 mmol/L — ABNORMAL LOW (ref 20.0–28.0)
Drawn by: 23281
FIO2: 100
MECHVT: 470 mL
O2 Saturation: 97.7 %
PEEP: 5 cmH2O
Patient temperature: 97.1
RATE: 18 resp/min
pCO2 arterial: 25.8 mmHg — ABNORMAL LOW (ref 32.0–48.0)
pH, Arterial: 7.045 — CL (ref 7.350–7.450)
pO2, Arterial: 206 mmHg — ABNORMAL HIGH (ref 83.0–108.0)

## 2021-08-04 LAB — BASIC METABOLIC PANEL
Anion gap: 21 — ABNORMAL HIGH (ref 5–15)
BUN: 106 mg/dL — ABNORMAL HIGH (ref 6–20)
CO2: 14 mmol/L — ABNORMAL LOW (ref 22–32)
Calcium: 7.1 mg/dL — ABNORMAL LOW (ref 8.9–10.3)
Chloride: 107 mmol/L (ref 98–111)
Creatinine, Ser: 1.14 mg/dL — ABNORMAL HIGH (ref 0.44–1.00)
GFR, Estimated: 58 mL/min — ABNORMAL LOW (ref >60)
Glucose, Bld: 236 mg/dL — ABNORMAL HIGH (ref 70–99)
Potassium: 6.6 mmol/L (ref 3.5–5.1)
Sodium: 142 mmol/L (ref 135–145)

## 2021-08-04 LAB — CBC WITH DIFFERENTIAL/PLATELET
Abs Immature Granulocytes: 0.21 10*3/uL — ABNORMAL HIGH (ref 0.00–0.07)
Basophils Absolute: 0 10*3/uL (ref 0.0–0.1)
Basophils Relative: 0 %
Eosinophils Absolute: 0 10*3/uL (ref 0.0–0.5)
Eosinophils Relative: 0 %
HCT: 22.2 % — ABNORMAL LOW (ref 36.0–46.0)
Hemoglobin: 6.4 g/dL — CL (ref 12.0–15.0)
Immature Granulocytes: 2 %
Lymphocytes Relative: 25 %
Lymphs Abs: 2.2 10*3/uL (ref 0.7–4.0)
MCH: 27.2 pg (ref 26.0–34.0)
MCHC: 28.8 g/dL — ABNORMAL LOW (ref 30.0–36.0)
MCV: 94.5 fL (ref 80.0–100.0)
Monocytes Absolute: 0.3 10*3/uL (ref 0.1–1.0)
Monocytes Relative: 4 %
Neutro Abs: 6.3 10*3/uL (ref 1.7–7.7)
Neutrophils Relative %: 69 %
Platelets: 218 10*3/uL (ref 150–400)
RBC: 2.35 MIL/uL — ABNORMAL LOW (ref 3.87–5.11)
RDW: 20.6 % — ABNORMAL HIGH (ref 11.5–15.5)
WBC: 9.1 10*3/uL (ref 4.0–10.5)
nRBC: 24.4 % — ABNORMAL HIGH (ref 0.0–0.2)

## 2021-08-04 LAB — PHOSPHORUS: Phosphorus: 7.8 mg/dL — ABNORMAL HIGH (ref 2.5–4.6)

## 2021-08-04 LAB — GLUCOSE, CAPILLARY: Glucose-Capillary: 229 mg/dL — ABNORMAL HIGH (ref 70–99)

## 2021-08-04 LAB — MAGNESIUM: Magnesium: 2.1 mg/dL (ref 1.7–2.4)

## 2021-08-04 MED ORDER — PHENYLEPHRINE HCL (PRESSORS) 10 MG/ML IV SOLN
INTRAVENOUS | Status: DC | PRN
  Administered 2021-08-04: 120 ug via INTRAVENOUS
  Administered 2021-08-04 (×2): 80 ug via INTRAVENOUS

## 2021-08-04 MED ORDER — SODIUM BICARBONATE 8.4 % IV SOLN
100.0000 meq | Freq: Once | INTRAVENOUS | Status: AC
  Administered 2021-08-04: 100 meq via INTRAVENOUS
  Filled 2021-08-04: qty 50

## 2021-08-04 MED ORDER — EPINEPHRINE 1 MG/10ML IJ SOSY
PREFILLED_SYRINGE | INTRAMUSCULAR | Status: AC
  Administered 2021-08-04: 1 mg
  Filled 2021-08-04: qty 10

## 2021-08-04 MED ORDER — EPINEPHRINE 1 MG/10ML IJ SOSY
0.1000 mg | PREFILLED_SYRINGE | Freq: Once | INTRAMUSCULAR | Status: AC
  Administered 2021-08-04: 0.1 mg via INTRAVENOUS

## 2021-08-04 MED ORDER — DEXTROSE 50 % IV SOLN
1.0000 | Freq: Once | INTRAVENOUS | Status: AC
  Administered 2021-08-04: 50 mL via INTRAVENOUS
  Filled 2021-08-04: qty 50

## 2021-08-04 MED ORDER — SODIUM CHLORIDE 0.9 % IV SOLN
2.0000 g | Freq: Two times a day (BID) | INTRAVENOUS | Status: DC
  Administered 2021-08-04: 2 g via INTRAVENOUS
  Filled 2021-08-04: qty 2

## 2021-08-04 MED ORDER — INSULIN ASPART 100 UNIT/ML IV SOLN
5.0000 [IU] | Freq: Once | INTRAVENOUS | Status: AC
  Administered 2021-08-04: 5 [IU] via INTRAVENOUS

## 2021-08-04 MED ORDER — EPINEPHRINE 1 MG/10ML IJ SOSY
0.2000 mg | PREFILLED_SYRINGE | Freq: Once | INTRAMUSCULAR | Status: AC
  Administered 2021-08-04: 0.2 mg via INTRAVENOUS

## 2021-08-04 MED ORDER — SODIUM BICARBONATE 8.4 % IV SOLN
INTRAVENOUS | Status: DC
  Filled 2021-08-04: qty 1000

## 2021-08-04 MED ORDER — PHENYLEPHRINE 40 MCG/ML (10ML) SYRINGE FOR IV PUSH (FOR BLOOD PRESSURE SUPPORT)
PREFILLED_SYRINGE | INTRAVENOUS | Status: AC
  Filled 2021-08-04: qty 10

## 2021-08-04 MED ORDER — EPINEPHRINE NICU 0.1 MG/ML INJECTION: 0.2000 mL/kg | Freq: Once | INTRAMUSCULAR | Status: DC

## 2021-08-04 MED ORDER — SODIUM BICARBONATE 8.4 % IV SOLN
50.0000 meq | Freq: Once | INTRAVENOUS | Status: AC
  Administered 2021-08-04: 50 meq via INTRAVENOUS

## 2021-08-04 MED ORDER — SUCCINYLCHOLINE CHLORIDE 200 MG/10ML IV SOSY
PREFILLED_SYRINGE | INTRAVENOUS | Status: DC | PRN
Start: 2021-08-04 — End: 2021-08-04
  Administered 2021-08-04: 100 mg via INTRAVENOUS

## 2021-08-04 MED ORDER — ALBUMIN HUMAN 5 % IV SOLN
25.0000 g | Freq: Once | INTRAVENOUS | Status: AC
  Administered 2021-08-04: 25 g via INTRAVENOUS
  Filled 2021-08-04: qty 500

## 2021-08-04 MED ORDER — ETOMIDATE 2 MG/ML IV SOLN
INTRAVENOUS | Status: DC | PRN
  Administered 2021-08-04: 8 mg via INTRAVENOUS

## 2021-08-04 MED ORDER — EPINEPHRINE 1 MG/10ML IJ SOSY
0.2000 mL/kg | PREFILLED_SYRINGE | Freq: Once | INTRAMUSCULAR | Status: AC
Start: 2021-08-04 — End: 2021-08-04
  Administered 2021-08-04: 19.8 mL via INTRAVENOUS

## 2021-08-04 MED ORDER — EPINEPHRINE 1 MG/10ML IJ SOSY
0.2000 mg | PREFILLED_SYRINGE | INTRAMUSCULAR | Status: AC | PRN
  Administered 2021-08-04 (×14): 0.2 mg via INTRAVENOUS

## 2021-08-04 MED ORDER — EPINEPHRINE 1 MG/10ML IJ SOSY
0.1000 mg | PREFILLED_SYRINGE | Freq: Once | INTRAMUSCULAR | Status: AC
Start: 2021-08-04 — End: 2021-08-04
  Administered 2021-08-04: 0.1 mg via INTRAVENOUS
  Filled 2021-08-04: qty 10

## 2021-08-04 MED ORDER — ALBUMIN HUMAN 25 % IV SOLN
INTRAVENOUS | Status: AC
  Filled 2021-08-04: qty 50

## 2021-08-04 MED ORDER — VANCOMYCIN VARIABLE DOSE PER UNSTABLE RENAL FUNCTION (PHARMACIST DOSING): Status: DC

## 2021-08-04 MED FILL — Medication: Qty: 1 | Status: AC

## 2021-08-05 ENCOUNTER — Encounter: Payer: Self-pay | Admitting: *Deleted

## 2021-08-05 DIAGNOSIS — K56609 Unspecified intestinal obstruction, unspecified as to partial versus complete obstruction: Secondary | ICD-10-CM | POA: Diagnosis present

## 2021-08-05 DIAGNOSIS — C259 Malignant neoplasm of pancreas, unspecified: Secondary | ICD-10-CM | POA: Diagnosis present

## 2021-08-05 NOTE — Progress Notes (Signed)
Notified of patient's death. Condolence card sent to family.   Oncology Nurse Navigator Documentation  Oncology Nurse Navigator Flowsheets 08/05/2021  Confirmed Diagnosis Date -  Diagnosis Status -  Planned Course of Treatment -  Phase of Treatment -  Chemotherapy Pending- Reason: -  Chemotherapy Actual Start Date: -  Radiation Actual Start Date: -  Navigator Follow Up Date: -  Navigator Follow Up Reason: -  Navigation Complete Date: 08/05/2021  Post Navigation: Continue to Follow Patient? No  Reason Not Navigating Patient: Hospice/Death  Navigator Location [facility]  Referral Date to RadOnc/MedOnc -  Navigator Encounter Type -  Telephone -  Treatment Initiated Date -  Patient Visit Type -  Treatment Phase -  Barriers/Navigation Needs -  Education -  Interventions -  Acuity -  Referrals -  Coordination of Care -  Education Method -  Support Groups/Services -  Time Spent with Patient 15

## 2021-08-19 ENCOUNTER — Other Ambulatory Visit (HOSPITAL_BASED_OUTPATIENT_CLINIC_OR_DEPARTMENT_OTHER): Payer: Self-pay

## 2021-08-19 NOTE — TOC Progression Note (Signed)
Transition of Care [facility]) - Progression Note    Patient Details  Name: Angela Wilson MRN: [id number] Date of Birth: [birth year]  Transition of Care [facility]) CM/SW Contact  Leeroy Cha, RN Phone Number: Aug 17, 2021, 9:00 AM  Clinical Narrative:    55 year old with unfortunate widely metastatic pancreatic cancer with no further lines of treatment available.  Prolonged admission with complications from cancer.  Colonic obstruction/ileus felt to be related to tumor burden, carcinomatosis with metastatic ascites.  Ongoing issues with pain.  Encephalopathy.  Severe protein calorie malnutrition, failure to thrive now on TPN.   Ongoing decompensation, transferred to the ICU 11/15 with hypotension.  Actively dying.  Worsened throughout the night in terms of mental status, breathing, further worsening of hypotension.  Was subsequently intubated.  Never lost a pulse.  Despite being on the ventilator and vasopressors for blood pressure support, she has progressive hypotension, now unable to register cuff pressures.  Has received multiple, many, frequent pushes of epinephrine.  Discussed in detail with family that ongoing resuscitative efforts are futile.  Despite aggressive care and life support patient is actively dying.  There is nothing more we can do.  Patient CODE STATUS changed to DNR/DNI.  Family expresses understanding.  Withdrawal of care is not an option per their healthcare values and their understanding of the patient's wishes.  TOC PLAN OF CARE: Following to see when will need transition to snf. Following for progression.   Expected Discharge Plan: [place] Barriers to Discharge: Continued Medical Work up  Expected Discharge Plan and Services Expected Discharge Plan: [place]   Discharge Planning Services: CM Consult   Living arrangements for the past 2 months: Single Family Home                                       Social Determinants of  Health (SDOH) Interventions    Readmission Risk Interventions Readmission Risk Prevention Plan 08/03/2021  Transportation Screening Complete  Medication Review Press photographer) Complete  PCP or Specialist appointment within 3-5 days of discharge Complete  HRI or [place] Complete  SW Recovery Care/Counseling Consult Complete  [place] Not Applicable  Some recent data might be hidden

## 2021-08-19 NOTE — Anesthesia Procedure Notes (Signed)
Procedure Name: Intubation Date/Time: 08-23-2021 4:35 AM Performed by: Nolon Nations, MD Pre-anesthesia Checklist: Patient identified, Emergency Drugs available, Suction available and Patient being monitored Patient Re-evaluated:Patient Re-evaluated prior to induction Oxygen Delivery Method: Circle system utilized Preoxygenation: Pre-oxygenation with 100% oxygen Induction Type: IV induction Ventilation: Mask ventilation without difficulty Laryngoscope Size: Mac and 3 Grade View: Grade II Tube type: Oral Tube size: 7.5 mm Number of attempts: 1 Airway Equipment and Method: Stylet and Oral airway Placement Confirmation: ETT inserted through vocal cords under direct vision, positive ETCO2, breath sounds checked- equal and bilateral and CO2 detector Secured at: 22 cm Tube secured with: Tape Dental Injury: Teeth and Oropharynx as per pre-operative assessment

## 2021-08-19 NOTE — Progress Notes (Signed)
Chaplain provided emotional and grief support to the many family and friends of Angela Wilson as they gathered to honor and mourn. Chaplain also worked with their faith leader to help them get the information they needed in order to be able to release Angela Wilson's body so they could bring her to the mosque.  [place], Bcc Pager, [phone] 4:45 PM

## 2021-08-19 NOTE — Death Summary Note (Signed)
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 [facility]) Active Problems:   Type 2 diabetes mellitus ([place])   Stage IV adenocarcinoma of pancreas ([place])   Essential hypertension, benign   Acute respiratory failure with hypoxia (HCC)   Malignant ascites   Pleural effusion on right   Hyponatremia   Internal jugular (IJ) vein thromboembolism, chronic, right (HCC)   Goals of care, counseling/discussion   Intravascular volume depletion   Malnutrition of moderate degree   Ileus (HCC)   Hypokalemia   Hypophosphatemia   Hypomagnesemia   DVT (deep venous thrombosis) ([place])   Anemia   Distended abdomen   Pancreatic cancer metastasized to intra-abdominal lymph node (HCC)   Hypotension   Nausea and vomiting   Shock ([place])   Colonic obstruction [facility])   Brief Hospital Course (including significant findings, care, treatment, and services provided and events leading to death)  Angela Wilson is a 55 y.o. year old female who has metastatic pancreatic cancer.  Followed by oncology.  Presented with nausea vomiting colonic obstruction from pancreatic cancer.  Essentially, failure to thrive.  Was hospitalized for 3 weeks.  Unable to take nutrition due to obstruction.  Worsening pain.  Worsening malignant ascites.  Actively dying.  Multiple goals of care discussion had with insistence for continued aggressive care despite counseling of futility.   Was transferred to the ICU again 08/03/2021 with hypotension.  This was in the setting of active dying.  Antibiotics were added in case of infection but no clear source.  She was escalated on higher doses of norepinephrine.  Further pressors were deemed futile.  She had worsening mental status and respiratory status.  Ultimately intubated 09-Aug-2023 overnight 11/17.  Subsequent refractory hypotension requiring multiple epinephrine pushes.  Essentially ongoing code for multiple hours.  Further resuscitative efforts deemed futile.  Despite most aggressive care including vasopressors, intubation, life support, ongoing ACLS she was actively dying.  This was described to family, she was made DNR by medical team.  Family expressed understanding.  She passed the morning 11/17 surrounded by loved ones.    Pertinent Labs and Studies  Significant Diagnostic Studies CT ABDOMEN PELVIS WO CONTRAST  Result Date: 07/24/2021 CLINICAL DATA:  Abdominal distension. Metastatic pancreatic carcinoma. IV contrast allergy. Vomiting and diarrhea. EXAM: CT ABDOMEN AND PELVIS WITHOUT CONTRAST TECHNIQUE: Multidetector CT imaging of the abdomen and pelvis was performed following the standard protocol without IV contrast. COMPARISON:  19-Jul-2021 FINDINGS: Lower chest: Atelectasis in the left lower lobe, right middle lobe, and right lower lobe. Large right pleural effusion. Small stable pericardial effusion. Central catheter tip terminates in the right atrium. Anterior pericardial node 0.6 cm in short axis on image 8 series 2. Hepatobiliary: Heterogeneity in the liver probably reflecting metastatic lesions. Nonvisualization of the gallbladder, presumed surgically absent. Pancreas: Solid mass of the pancreatic tail 4.6 by 2.9 cm, roughly stable. Spleen: Unremarkable Adrenals/Urinary Tract: Punctate bilateral nonobstructive renal calculi. Adrenal glands unremarkable. Stomach/Bowel: Frothy fluid levels in the distal colon and rectum with  diarrheal  process. Dilated colon down to the level of the sigmoid, there is some adjacent nodularity but a definite cause for the transition is not observed no substantially dilated small bowel currently. Vascular/Lymphatic: Patency not assessed due to lack of IV contrast. No periaortic adenopathy is identified. There appears to be a trace amount of gas in a likely venous structure tracking along the anterior omentum on image 41 series 2, this could be related to gas from an IV as I do not demonstrate other classic hallmarks of portal venous gas along more characteristic locations for example in the mesenteric bowel. Reproductive: Uterus appears mildly flattened anteriorly, with large conglomerate pelvic cystic mass involving both adnexa measuring about 15.6 by 11.6 cm on image 66 of series 2, roughly similar to prior. Other: Substantial ascites similar to prior with mesenteric edema and extensive scattered nodularity along the mesentery and omentum compatible peritoneal tumor. 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 IMPRESSION: 1. Trace gas in tiny venous structures to the left of the stomach and also along the anterior omentum. Although not a typical distribution for portal venous gas, there is also dilated colon up to the level of the sigmoid colon (without visible pneumatosis). Correlate with lactate levels in assessing for the possibility of ischemic bowel. 2. Generally similar appearance of large right effusion, substantial ascites, extensive peritoneal metastatic disease, large cystic pelvic masses, pancreatic tail tumor, and some heterogeneity in the liver suspicious for hepatic metastatic lesions. 3. Bilateral punctate nonobstructive nephrolithiasis. 4. Atelectasis in the left lower lobe, right middle lobe, and right lower lobe. 5. Stable small pericardial effusion. Critical Value/emergent results were called by telephone at the time of  interpretation on 07/24/2021 at 6:45 pm to provider Dr. Verlee Monte, who verbally acknowledged these results. Electronically Signed   By: Van Clines M.D.   On: 07/24/2021 18:46   CT ABDOMEN PELVIS WO CONTRAST  Result Date: 07/02/2021 CLINICAL DATA:  History of pancreatic cancer.  Abdominal distention. EXAM: CT ABDOMEN AND PELVIS WITHOUT CONTRAST TECHNIQUE: Multidetector CT imaging of the abdomen and pelvis was performed following the standard protocol without IV contrast. COMPARISON:  April 21, 2021. FINDINGS: Lower chest: Increased right pleural effusion is noted with associated atelectasis of the right lower lobe. Hepatobiliary: Heterogeneous appearance of hepatic parenchyma is noted suggesting diffuse metastatic disease as noted on prior exam. Some degree of hepatic steatosis may be present as well. Status post cholecystectomy. No definite biliary dilatation is noted. Pancreas: Solid mass is seen involving pancreatic tail measuring 4.8 x 2.6 cm consistent with history of pancreatic adenocarcinoma. No definite ductal dilatation is noted. Spleen: Normal in size without focal abnormality. Adrenals/Urinary Tract: Adrenal glands appear normal. Nonobstructive left nephrolithiasis is noted. No hydronephrosis or renal obstruction is noted. Urinary bladder is decompressed. Stomach/Bowel: The stomach appears normal. There is no evidence of bowel obstruction or inflammation. Vascular/Lymphatic: No significant vascular findings are present. No enlarged abdominal or pelvic lymph nodes. Reproductive: Uterus is unremarkable. 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 These are significantly enlarged compared to prior exam. Overall they measure 15.5 x 11.0 cm, and it is difficult to distinguish right from left. Other: There is interval development of mild to moderate ascites in the pelvis and around the liver and spleen. 7.9 x 5.0 cm  mass is seen along the anterior abdominal wall in the region of the umbilicus which is enlarged compared to prior exam and  consistent with worsening peritoneal implant. Musculoskeletal: Grade 1 anterolisthesis of L4-5 is noted secondary to bilateral L5 spondylolysis. No acute osseous abnormality is noted. IMPRESSION: 4.8 x 2.6 cm solid discrete mass is seen involving pancreatic tail consistent with history of pancreatic adenocarcinoma. Mild to moderate ascites is noted both in the pelvis and around the liver and spleen in the upper abdomen. 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 This is significantly enlarged compared to prior exam. Significantly enlarged peritoneal implant is seen along the anterior abdominal wall in the periumbilical region consistent with peritoneal carcinomatosis. Heterogeneous appearance of hepatic parenchyma is noted consistent with multiple metastatic lesions. Some degree of hepatic steatosis may be present as well. Increased right pleural effusion is noted with associated atelectasis of the right lower lobe. Electronically Signed   By: Marijo Conception M.D.   On: 07/10/2021 14:56   DG Chest 1 View  Result Date: 07/24/2021 CLINICAL DATA:  Abdominal pain, nausea and vomiting, pancreatic cancer EXAM: CHEST  1 VIEW COMPARISON:  06/19/2021 chest radiograph. FINDINGS: Right internal jugular Port-A-Cath terminates over the right atrium, stable. Stable cardiomediastinal silhouette with normal heart size. No pneumothorax. Small to moderate right and small left pleural effusions, slightly increased. No overt pulmonary edema. Hazy bibasilar lung opacities, mildly worsened, favor atelectasis. IMPRESSION: Small to moderate right and small left pleural effusions, slightly increased. Hazy bibasilar lung opacities, mildly worsened, favor atelectasis. Electronically Signed   By: Ilona Sorrel M.D.   On: 07/24/2021  11:26   DG Chest 2 View  Result Date: 07/14/2021 CLINICAL DATA:  Suspected sepsis EXAM: CHEST - 2 VIEW COMPARISON:  04/08/2020 FINDINGS: Low volume examination. Small right pleural effusion associated atelectasis or consolidation. The left lung is normally aerated. Cardiomegaly. Right chest port catheter. IMPRESSION: Low volume examination. Small right pleural effusion and associated atelectasis or consolidation. Electronically Signed   By: Delanna Ahmadi M.D.   On: 07/02/2021 14:37   DG Cervical Spine 2 or 3 views  Result Date: 07/21/2021 CLINICAL DATA:  Pancreatic cancer with metastases. EXAM: CERVICAL SPINE - 2-3 VIEW COMPARISON:  None. FINDINGS: Lateral view includes skull base through mid C6. There is straightening of normal cervical lordosis. There is no evidence for subluxation. Disc spaces are maintained. There is no acute fracture. No focal osseous lesions are identified. There is no prevertebral soft tissue swelling. Catheter is partially visualized overlying the right chest. IMPRESSION: Negative cervical spine radiographs. Electronically Signed   By: Ronney Asters M.D.   On: 07/21/2021 16:18   DG Abd 1 View  Result Date: 08/03/2021 CLINICAL DATA:  Abdominal pain and distention EXAM: ABDOMEN - 1 VIEW COMPARISON:  Previous studies including the examination of 08/01/2021 FINDINGS: There is gaseous distention of transverse colon measuring up to 8.9 cm in diameter. There is no distention of rectosigmoid or small bowel loops. Upper abdomen is not included in its entirety limiting evaluation of stomach. IMPRESSION: There is gaseous distention of transverse colon suggesting ileus or distal colonic obstruction. No significant interval changes are noted since 08/01/2021. Electronically Signed   By: Elmer Picker M.D.   On: 08/03/2021 09:49   DG Abd 1 View  Result Date: 08/01/2021 CLINICAL DATA:  55 year old female with bowel obstruction. EXAM: ABDOMEN - 1 VIEW COMPARISON:  07/24/2021,  07/29/2021, 07/30/2021 FINDINGS: Similar appearing gaseous distension of the transverse colon measuring up to approximately 9 cm near the hepatic flexure. Similar appearing of gastric decompression tube  with the tip in the stomach. No significant small bowel gaseous distension. Relative paucity of distal colonic gas. IMPRESSION: Similar appearing colonic obstruction versus ileus. Electronically Signed   By: Ruthann Cancer M.D.   On: 08/01/2021 09:46   DG Abd 1 View  Result Date: 07/30/2021 CLINICAL DATA:  Follow-up ileus EXAM: ABDOMEN - 1 VIEW COMPARISON:  Abdominal x-ray 07/29/2021 FINDINGS: Enteric tube tip is in the stomach. Persistent distended loops of bowel across the mid abdomen which measure up to approximately 9.2 cm in diameter, mildly improved since previous study. No suspicious calcifications visualized. IMPRESSION: Persistent but mildly improved distended loops of bowel. Electronically Signed   By: Ofilia Neas M.D.   On: 07/30/2021 09:11   DG Abd 1 View  Result Date: 07/29/2021 CLINICAL DATA:  Follow-up ileus EXAM: ABDOMEN - 1 VIEW COMPARISON:  Multiple prior radiographs including most recent dated July 27, 2021 from FINDINGS: NG tube with distal tip overlying the stomach. Gaseous dilated colonic loop in the right abdomen measuring up to 11 cm, unchanged. There also dilated bowel lobes in the mid abdomen and left lower quadrant. IMPRESSION: 1. No significant interval change and multiple distended bowel loops concerning for ileus/obstruction. 2. NG tube with side port and distal tip overlying the gastric body. Electronically Signed   By: Keane Police D.O.   On: 07/29/2021 09:12   DG Abd 1 View  Result Date: 07/27/2021 CLINICAL DATA:  NG tube placement EXAM: ABDOMEN - 1 VIEW COMPARISON:  None. FINDINGS: Nasogastric tube with the tip projecting over the stomach. Gaseous distension the small bowel and colon. No evidence of pneumoperitoneum, portal venous gas or pneumatosis. No  pathologic calcifications along the expected course of the ureters. No acute osseous abnormality. IMPRESSION: Nasogastric tube with the tip projecting over the stomach. Electronically Signed   By: Kathreen Devoid M.D.   On: 07/27/2021 11:22   DG Abd 1 View  Result Date: 07/27/2021 CLINICAL DATA:  Abdominal distention, vomiting EXAM: ABDOMEN - 1 VIEW COMPARISON:  07/26/2021 FINDINGS: There are dilated bowel loops in the mid abdomen measuring up to 11.5 cm in maximum diameter. This finding is not changed significantly. There is haziness in the abdomen and pelvis, possibly suggesting ascites. IMPRESSION: There is dilation of bowel loops in mid abdomen suggesting ileus or partial obstruction. No significant interval changes are noted. Electronically Signed   By: Elmer Picker M.D.   On: 07/27/2021 08:06   DG Abd 1 View  Result Date: 07/25/2021 CLINICAL DATA:  Check gastric catheter placement EXAM: ABDOMEN - 1 VIEW COMPARISON:  None. FINDINGS: Gastric catheter is noted within the stomach. Scattered large and small bowel gas is noted similar to that seen on the prior exam. No free air is seen. IMPRESSION: Gastric catheter in the stomach. Findings suggestive of ileus. Electronically Signed   By: Inez Catalina M.D.   On: 07/25/2021 00:00   DG Abd 1 View  Result Date: 07/24/2021 CLINICAL DATA:  Abdominal pain, nausea and vomiting EXAM: ABDOMEN - 1 VIEW COMPARISON:  07/21/2021 abdominal radiographs FINDINGS: No disproportionately dilated small bowel loops. Prominent gaseous distension of the colon, most prominent in the transverse colon, worsened. No evidence of pneumatosis or pneumoperitoneum. No radiopaque nephrolithiasis. IMPRESSION: Prominent gaseous distension of the colon, most prominent in the transverse colon, worsened. Findings are suggestive of colonic ileus. No disproportionately dilated small bowel loops to suggest small bowel obstruction. Electronically Signed   By: Ilona Sorrel M.D.   On:  07/24/2021 11:25   DG Abd  1 View  Result Date: 07/21/2021 CLINICAL DATA:  Distended abdomen. EXAM: ABDOMEN - 1 VIEW COMPARISON:  None. FINDINGS: The bowel gas pattern is normal. There is a calcification in the left hemipelvis, likely a phlebolith. No acute fractures are seen. IMPRESSION: Negative. Electronically Signed   By: Ronney Asters M.D.   On: 07/21/2021 16:58   US Venous Img Lower Bilateral (DVT)  Result Date: 07/07/2021 CLINICAL DATA:  Bilateral lower extremity edema and history of pancreatic cancer. EXAM: BILATERAL LOWER EXTREMITY VENOUS DOPPLER ULTRASOUND TECHNIQUE: Gray-scale sonography with graded compression, as well as color Doppler and duplex ultrasound were performed to evaluate the lower extremity deep venous systems from the level of the common femoral vein and including the common femoral, femoral, profunda femoral, popliteal and calf veins including the posterior tibial, peroneal and gastrocnemius veins when visible. The superficial great saphenous vein was also interrogated. Spectral Doppler was utilized to evaluate flow at rest and with distal augmentation maneuvers in the common femoral, femoral and popliteal veins. COMPARISON:  None. FINDINGS: RIGHT LOWER EXTREMITY Common Femoral Vein: No evidence of thrombus. Normal compressibility, respiratory phasicity and response to augmentation. Saphenofemoral Junction: No evidence of thrombus. Normal compressibility and flow on color Doppler imaging. Profunda Femoral Vein: No evidence of thrombus. Normal compressibility and flow on color Doppler imaging. Femoral Vein: No evidence of thrombus. Normal compressibility, respiratory phasicity and response to augmentation. Popliteal Vein: No evidence of thrombus. Normal compressibility, respiratory phasicity and response to augmentation. Calf Veins: No evidence of thrombus. Normal compressibility and flow on color Doppler imaging. Superficial Great Saphenous Vein: No evidence of thrombus. Normal  compressibility. Venous Reflux:  None. Other Findings: Baker's cyst within the popliteal fossa measures 3.8 by 1.1 x 2.3 cm. LEFT LOWER EXTREMITY Common Femoral Vein: No evidence of thrombus. Normal compressibility, respiratory phasicity and response to augmentation. Saphenofemoral Junction: No evidence of thrombus. Normal compressibility and flow on color Doppler imaging. Profunda Femoral Vein: No evidence of thrombus. Normal compressibility and flow on color Doppler imaging. Femoral Vein: No evidence of thrombus. Normal compressibility, respiratory phasicity and response to augmentation. Popliteal Vein: No evidence of thrombus. Normal compressibility, respiratory phasicity and response to augmentation. Calf Veins: No evidence of thrombus. Normal compressibility and flow on color Doppler imaging. Superficial Great Saphenous Vein: No evidence of thrombus. Normal compressibility. Venous Reflux:  None. Other Findings: Complex fluid collection within the popliteal fossa measures 3.4 x 1.2 x 1.7 cm containing diffuse internal echoes and areas of calcification. IMPRESSION: 1. No evidence of deep venous thrombosis in either lower extremity. 2. Right-sided Baker's cyst. 3. Complex fluid collection within the left popliteal fossa containing internal echoes and calcification. Favor complex Baker's cyst with internal debris and calcification. Electronically Signed   By: Kerby Moors M.D.   On: 07/14/2021 15:04   US Paracentesis  Result Date: 07/26/2021 INDICATION: Patient with history of pancreatic cancer, recurrent ascites. Request to IR for diagnostic and therapeutic paracentesis. EXAM: ULTRASOUND GUIDED DIAGNOSTIC AND THERAPEUTIC PARACENTESIS MEDICATIONS: 10 mL 1% lidocaine COMPLICATIONS: None immediate. PROCEDURE: Informed written consent was obtained from the patient after a discussion of the risks, benefits and alternatives to treatment. A timeout was performed prior to the initiation of the procedure. Initial  ultrasound scanning demonstrates a moderate amount of ascites within the left lower abdominal quadrant. The left lower abdomen was prepped and draped in the usual sterile fashion. 1% lidocaine was used for local anesthesia. Following this, a 19 gauge, 7-cm, Yueh catheter was introduced. An ultrasound image was saved for documentation  purposes. The paracentesis was performed. The catheter was removed and a dressing was applied. The patient tolerated the procedure well without immediate post procedural complication. FINDINGS: A total of approximately 2.9 L of hazy yellow fluid was removed. Samples were sent to the laboratory as requested by the clinical team. IMPRESSION: Successful ultrasound-guided paracentesis yielding 2.9 liters of peritoneal fluid. Read by Candiss Norse, PA-C Electronically Signed   By: Michaelle Birks M.D.   On: 07/26/2021 18:32   US Paracentesis  Result Date: 07/17/2021 INDICATION: Patient with history of pancreatic cancer, recurrent ascites. Request received for therapeutic paracentesis. EXAM: ULTRASOUND GUIDED THERAPEUTIC PARACENTESIS MEDICATIONS: 10 mL 1% lidocaine COMPLICATIONS: None immediate. PROCEDURE: Informed written consent was obtained from the patient after a discussion of the risks, benefits and alternatives to treatment. A timeout was performed prior to the initiation of the procedure. Initial ultrasound scanning demonstrates a moderate amount of ascites within the left upper abdominal quadrant. The left upper abdomen was prepped and draped in the usual sterile fashion. 1% lidocaine was used for local anesthesia. Following this, a 19 gauge, 10-cm, Yueh catheter was introduced. An ultrasound image was saved for documentation purposes. The paracentesis was performed. The catheter was removed and a dressing was applied. The patient tolerated the procedure well without immediate post procedural complication. FINDINGS: A total of approximately 3.4 liters of yellow fluid was  removed. IMPRESSION: Successful ultrasound-guided therapeutic paracentesis yielding 3.4 liters of peritoneal fluid. Read by: Rowe Robert, PA-C Electronically Signed   By: Jacqulynn Cadet M.D.   On: 07/17/2021 09:37   US Paracentesis  Result Date: 07/09/2021 INDICATION: History of pancreatic cancer. Abdominal discomfort with ascites. Request for therapeutic paracentesis. EXAM: ULTRASOUND GUIDED LEFT LOWER QUADRANT PARACENTESIS MEDICATIONS: 1% plain lidocaine, 5 mL COMPLICATIONS: None immediate. PROCEDURE: Informed written consent was obtained from the patient after a discussion of the risks, benefits and alternatives to treatment. A timeout was performed prior to the initiation of the procedure. Initial ultrasound scanning demonstrates a moderate amount of ascites within the left lower abdominal quadrant. Ascites is noted to be partially loculated. The left lower abdomen was prepped and draped in the usual sterile fashion. 1% lidocaine was used for local anesthesia. Following this, a 19 gauge, 10-cm, Yueh catheter was introduced. An ultrasound image was saved for documentation purposes. The paracentesis was performed. The catheter was removed and a dressing was applied. The patient tolerated the procedure well without immediate post procedural complication. FINDINGS: A total of approximately 2.8 L of clear yellow fluid was removed. IMPRESSION: Successful ultrasound-guided paracentesis yielding 2.8 liters of peritoneal fluid. Read by: Ascencion Dike PA-C Electronically Signed   By: Sandi Mariscal M.D.   On: 07/09/2021 16:09   DG CHEST PORT 1 VIEW  Result Date: 08/15/2021 CLINICAL DATA:  Status post intubation. EXAM: PORTABLE CHEST 1 VIEW COMPARISON:  07/27/2021 FINDINGS: 0504 hours. Endotracheal tube tip is 9 mm above the base of the carina. The cardio pericardial silhouette is enlarged. Low lung volumes. Patient is rotated to the left. Hazy opacity over the right lower lung is likely superimposed soft tissue  with persistent retrocardiac left base collapse/consolidation. Probable layering tiny effusions. Right Port-A-Cath again noted. Telemetry leads overlie the chest. IMPRESSION: 1. Endotracheal tube tip is 9 mm above the base of the carina. 2. Low lung volumes with persistent retrocardiac left base collapse/consolidation. Electronically Signed   By: Misty Stanley M.D.   On: 08/15/2021 05:20   DG CHEST PORT 1 VIEW  Result Date: 07/27/2021 CLINICAL DATA:  Pleural effusion.  Additional history provided: Pleural effusions status post thoracentesis. EXAM: PORTABLE CHEST 1 VIEW COMPARISON:  Prior chest radiographs 07/24/2021 and earlier. FINDINGS: Right chest infusion port catheter with tip projecting at the level of the superior cavoatrial junction. An enteric tube is present, extending below the level of the left hemidiaphragm with tip excluded from the field of view. The cardiomediastinal silhouette is unchanged. No significant right pleural effusion is appreciated status post thoracentesis. A small left pleural effusion is unchanged. Mild bibasilar atelectasis. No evidence of pneumothorax. No acute bony abnormality identified. IMPRESSION: No significant residual right pleural effusion is appreciated status post thoracentesis. No evidence of pneumothorax. Persistent small left pleural effusion. Mild bibasilar atelectasis. Electronically Signed   By: Kellie Simmering D.O.   On: 07/27/2021 13:54   DG Abd Portable 1V  Result Date: 07/26/2021 CLINICAL DATA:  Follow-up ileus EXAM: PORTABLE ABDOMEN - 1 VIEW COMPARISON:  Abdominal x-ray 07/24/2021 FINDINGS: Enteric tube tip is in the stomach. Redemonstration of abnormally distended gas-filled loops of bowel mostly in the mid abdomen which measure up to 8.4 cm in diameter and appear mildly increased diameter since previous study. Paucity of bowel gas in the pelvis. No suspicious calcifications visualized. IMPRESSION: Mildly increased distension of abnormal gas-filled loops of  bowel in the mid abdomen since previous study. May represent obstruction or ileus. Electronically Signed   By: Ofilia Neas M.D.   On: 07/26/2021 08:30   [place] ASCITES (ABDOMEN LIMITED)  Result Date: 08/03/2021 CLINICAL DATA:  Abdominal distention EXAM: LIMITED ABDOMEN ULTRASOUND FOR ASCITES TECHNIQUE: Limited ultrasound survey for ascites was performed in all four abdominal quadrants. COMPARISON:  None. FINDINGS: Large ascites is present in all 4 quadrants IMPRESSION: Large ascites. Electronically Signed   By: Elmer Picker M.D.   On: 08/03/2021 12:30   [place] ASCITES (ABDOMEN LIMITED)  Result Date: 07/21/2021 CLINICAL DATA:  55 year old female with history of pancreatic cancer and ascites. EXAM: LIMITED ABDOMEN ULTRASOUND FOR ASCITES TECHNIQUE: Limited ultrasound survey for ascites was performed in all four abdominal quadrants. COMPARISON:  07/16/2021 FINDINGS: Trace ascites in the right upper, left upper, and right lower quadrants. No significant ascites in the left lower quadrant. IMPRESSION: Trace ascites.  No safe window with for paracentesis at this time. Ruthann Cancer, MD [facility] Electronically Signed   By: Ruthann Cancer M.D.   On: 07/21/2021 16:10   VAS [place] UPPER EXTREMITY VENOUS DUPLEX  Result Date: 07/20/2021 UPPER VENOUS STUDY  Patient Name:  KENSI KARR  Date of Exam:   07/20/2021 Medical Rec #: [id number]          Accession #:    [id number] Date of Birth: March 20, 1966           Patient Gender: F Patient Age:   45 years Exam Location:  [facility] Procedure:      VAS [place] UPPER EXTREMITY VENOUS DUPLEX Referring Phys: Burney Gauze --------------------------------------------------------------------------------  Indications: Pain Risk Factors: Cancer. Comparison Study: 04/20/2021 - R IJV, and Innominate vein DVT Performing Technologist: Oliver Hum RVT  Examination Guidelines: A complete evaluation includes B-mode  imaging, spectral Doppler, color Doppler, and power Doppler as needed of all accessible portions of each vessel. Bilateral testing is considered an integral part of a complete examination. Limited examinations for reoccurring indications may be performed as noted.  Right Findings: +----------+------------+---------+-----------+----------+-------+ RIGHT     CompressiblePhasicitySpontaneousPropertiesSummary +----------+------------+---------+-----------+----------+-------+ IJV           None       No  No               Chronic +----------+------------+---------+-----------+----------+-------+ Subclavian    Full       Yes       Yes                      +----------+------------+---------+-----------+----------+-------+ Axillary      Full       Yes       Yes                      +----------+------------+---------+-----------+----------+-------+ Brachial      Full       Yes       Yes                      +----------+------------+---------+-----------+----------+-------+ Radial        Full                                          +----------+------------+---------+-----------+----------+-------+ Ulnar         Full                                          +----------+------------+---------+-----------+----------+-------+ Cephalic      Full                                          +----------+------------+---------+-----------+----------+-------+ Basilic       Full                                          +----------+------------+---------+-----------+----------+-------+  Left Findings: +----------+------------+---------+-----------+----------+-------+ LEFT      CompressiblePhasicitySpontaneousPropertiesSummary +----------+------------+---------+-----------+----------+-------+ Subclavian    Full       Yes       Yes                      +----------+------------+---------+-----------+----------+-------+  Summary:  Right: No evidence of  superficial vein thrombosis in the upper extremity. 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  Left: No evidence of thrombosis in the subclavian.  *See table(s) above for measurements and observations.  Diagnosing physician: Deitra Mayo MD Electronically signed by Deitra Mayo MD on 07/20/2021 at 5:08:16 PM.    Final    ECHOCARDIOGRAM LIMITED  Result Date: 07/23/2021    ECHOCARDIOGRAM LIMITED REPORT   Patient Name:   AIREN DALES Date of Exam: 07/23/2021 Medical Rec #:  [id number]         Height:       66.0 in Accession #:    [id number]        Weight:       211.0 lb Date of Birth:  Oct 26, 1965          BSA:          2.046 m Patient Age:    45 years          BP:           127/83 mmHg Patient Gender: F  HR:           113 bpm. Exam Location:  Inpatient Procedure: Limited Echo, Color Doppler and Cardiac Doppler Indications:    R94.31 Abnormal EKG  History:        Patient has no prior history of Echocardiogram examinations.                 Risk Factors:Hypertension, Diabetes, Dyslipidemia and Stage IV                 Pancreatic Cancer.  Sonographer:    Raquel Sarna Senior RDCS Referring Phys: [id number] Noland Fordyce MATHEWS  Sonographer Comments: Technically difficult study due to poor echo windows, suboptimal parasternal window, suboptimal apical window and suboptimal subcostal window. IMPRESSIONS  1. Limited study with very difficult windows for TTE. Grossly LV function appears preserved 60-65% but cannot assess for WMA. RV not well visualized.  2. Left ventricular ejection fraction, by estimation, is 60 to 65%. The left ventricle has normal function. Left ventricular endocardial border not optimally defined to evaluate regional wall motion.  3. Right ventricular systolic function was not well visualized. The right ventricular size is not well visualized. There is normal pulmonary artery systolic pressure.  4. The mitral valve was not well  visualized.  5. The aortic valve was not well visualized.  6. The inferior vena cava is normal in size with <50% respiratory variability, suggesting right atrial pressure of 8 mmHg. FINDINGS  Left Ventricle: Left ventricular ejection fraction, by estimation, is 60 to 65%. The left ventricle has normal function. Left ventricular endocardial border not optimally defined to evaluate regional wall motion. Right Ventricle: The right ventricular size is not well visualized. Right vetricular wall thickness was not well visualized. Right ventricular systolic function was not well visualized. There is normal pulmonary artery systolic pressure. The tricuspid regurgitant velocity is 2.57 m/s, and with an assumed right atrial pressure of 8 mmHg, the estimated right ventricular systolic pressure is 38.4 mmHg. Left Atrium: Left atrial size was not well visualized. Right Atrium: Right atrial size was not well visualized. Pericardium: There is no evidence of pericardial effusion. Presence of pericardial fat pad. Mitral Valve: The mitral valve was not well visualized. Tricuspid Valve: The tricuspid valve is grossly normal. Tricuspid valve regurgitation is mild . No evidence of tricuspid stenosis. Aortic Valve: The aortic valve was not well visualized. Pulmonic Valve: The pulmonic valve was not well visualized. Pulmonic valve regurgitation is not visualized. No evidence of pulmonic stenosis. Aorta: The aortic root is normal in size and structure. Venous: The inferior vena cava is normal in size with less than 50% respiratory variability, suggesting right atrial pressure of 8 mmHg. AORTIC VALVE LVOT Vmax:   87.40 cm/s LVOT Vmean:  57.400 cm/s LVOT VTI:    0.127 m TRICUSPID VALVE TR Peak grad:   26.4 mmHg TR Vmax:        257.00 cm/s  SHUNTS Systemic VTI: 0.13 m Eleonore Chiquito MD Electronically signed by Eleonore Chiquito MD Signature Date/Time: 07/23/2021/1:48:15 PM    Final    [place] EKG SITE RITE  Result Date: 08/03/2021 If Site Rite  image not attached, placement could not be confirmed due to current cardiac rhythm.  US THORACENTESIS ASP PLEURAL SPACE W/IMG GUIDE  Result Date: 07/27/2021 INDICATION: History of pancreatic cancer, shortness of breast, pleural effusion seen on previous chest x-ray. Request for therapeutic and diagnostic thoracentesis. EXAM: ULTRASOUND GUIDED RIGHT THORACENTESIS MEDICATIONS: 10 mL 1% lidocaine COMPLICATIONS: None immediate. PROCEDURE: An ultrasound guided thoracentesis was  thoroughly discussed with the patient and questions answered. The benefits, risks, alternatives and complications were also discussed. The patient understands and wishes to proceed with the procedure. Written consent was obtained. Ultrasound was performed to localize and mark an adequate pocket of fluid in the right chest. The area was then prepped and draped in the normal sterile fashion. 1% Lidocaine was used for local anesthesia. Under ultrasound guidance a 6 Fr Safe-T-Centesis catheter was introduced. Thoracentesis was performed. The catheter was removed and a dressing applied. FINDINGS: A total of approximately 600 cc of hazy amber fluid was removed. Samples were sent to the laboratory as requested by the clinical team. Post procedure chest X-ray reviewed, negative for pneumothorax. IMPRESSION: Successful ultrasound guided right thoracentesis yielding 600 cc of pleural fluid. Read by: Durenda Guthrie, PA-C Electronically Signed   By: Ruthann Cancer M.D.   On: 07/27/2021 14:52    Microbiology Recent Results (from the past 240 hour(s))  Culture, body fluid w Gram Stain-bottle     Status: None   Collection Time: 07/26/21  2:29 PM   Specimen: Fluid  Result Value Ref Range Status   Specimen Description FLUID PERITONEAL  Final   Special Requests BOTTLES DRAWN AEROBIC AND ANAEROBIC  Final   Culture   Final    NO GROWTH 5 DAYS Performed at [facility], [address] [place], [place] [address]    Report Status 07/31/2021 FINAL   Final  Gram stain     Status: None   Collection Time: 07/26/21  2:29 PM   Specimen: Fluid  Result Value Ref Range Status   Specimen Description FLUID PERITONEAL  Final   Special Requests NONE  Final   Gram Stain   Final    MODERATE WBC SEEN NO ORGANISMS SEEN Performed at [facility], [place] [address], [place], [place] [address]    Report Status 07/28/2021 FINAL  Final  Culture, body fluid w Gram Stain-bottle     Status: None   Collection Time: 07/27/21  1:15 PM   Specimen: Fluid  Result Value Ref Range Status   Specimen Description FLUID PLEURAL  Final   Special Requests   Final    BOTTLES DRAWN AEROBIC AND ANAEROBIC Blood Culture adequate volume   Culture   Final    NO GROWTH 5 DAYS Performed at [facility], [place] [address], [place], [place] [address]    Report Status 08/01/2021 FINAL  Final  Gram stain     Status: None   Collection Time: 07/27/21  1:15 PM   Specimen: Fluid  Result Value Ref Range Status   Specimen Description FLUID PLEURAL  Final   Special Requests   Final    BOTTLES DRAWN AEROBIC AND ANAEROBIC Blood Culture adequate volume   Gram Stain   Final    NO SQUAMOUS EPITHELIAL CELLS SEEN NO WBC SEEN NO ORGANISMS SEEN Performed at [facility], [place] [address] [place], [place] [address]    Report Status 07/29/2021 FINAL  Final    Lab Basic Metabolic Panel: Recent Labs  Lab 07/31/21 0553 08/01/21 0500 08/02/21 0600 08/03/21 0156 08-20-2021 0515  NA 140 141 140 141 142  K 3.6 3.9 4.0 4.5 6.6*  CL 109 112* 111 113* 107  CO2 23 23 23  19* 14*  GLUCOSE 122* 138* 120* 178* 236*  BUN 46* 62* 77* 89* 106*  CREATININE 0.37* 0.45 0.51 0.60 1.14*  CALCIUM 7.9* 8.0* 8.2* 8.3* 7.1*  MG 2.0 1.8 2.3  2.3 2.1  PHOS 3.6 3.9 4.3 4.6 7.8*   Liver Function Tests: Recent Labs  Lab 07/30/21 0354 07/31/21 0553 08/01/21 0500 08/02/21 0600 08/03/21 0156  AST 13* 18 14* 14* 14*  ALT 11 11 10 11 11   ALKPHOS 53 52 41 56 61  BILITOT 0.5 0.6 1.1 1.0  0.8  PROT 5.8* 5.7* 5.9* 6.1* 6.2*  ALBUMIN 2.1* 2.0* 3.5 3.0* 2.6*   No results for input(s): LIPASE, AMYLASE in the last 168 hours. No results for input(s): AMMONIA in the last 168 hours. CBC: Recent Labs  Lab 07/31/21 0553 08/01/21 0500 08/01/21 2145 08/02/21 0600 08/03/21 0156 08/03/21 1008 08/03/21 1600 September 02, 2021 0515  WBC 5.9 4.0  --  4.2 4.6  --   --  9.1  NEUTROABS 4.1 3.1  --  3.3 3.4  --   --  6.3  HGB 8.7* 6.7*   < > 8.9* 8.5* 8.4* 8.0* 6.4*  HCT 29.3* 22.9*   < > 28.8* 27.7* 27.7* 27.0* 22.2*  MCV 87.7 89.1  --  87.0 86.8  --   --  94.5  PLT 199 164  --  196 236  --   --  218   < > = values in this interval not displayed.   Cardiac Enzymes: No results for input(s): CKTOTAL, CKMB, CKMBINDEX, TROPONINI in the last 168 hours. Sepsis Labs: Recent Labs  Lab 08/01/21 0500 08/02/21 0600 08/03/21 0156 09-02-21 0515  WBC 4.0 4.2 4.6 9.1    Procedures/Operations  As per EMR   Bonna Gains Anastasha Ortez 08/05/2021, 8:51 AM

## 2021-08-19 NOTE — Progress Notes (Signed)
[place] Progress Note Patient Name: Angela Wilson DOB: 09/23/1965 MRN: [id number]   Date of Service  08/23/21  HPI/Events of Note  K+ 6.6  eICU Interventions  Hyperkalemia treatment orders entered.        Kerry Kass Aubert Choyce 08-23-2021, 6:31 AM

## 2021-08-19 NOTE — Progress Notes (Signed)
Palliative care brief note  Notified of [name]'s death this AM.  I stopped by to offer condolences to family.  Multiple family members were in the room mourning.  Her Imam is present to support family.    Family in the hall denied any needs at this time.  Spiritual care team from the hospital is following for support as well.    Angela Rough, MD [facility] Team [phone]  NO CHARGE NOTE

## 2021-08-19 NOTE — Progress Notes (Signed)
At approximately 0415, bedside RN and charge observed persistent hypotension with pressors at max dose. Patient unresponsive as evidenced by eyes wide open but not blinking to threat. Breathing observed as agonal. Prepared daughter who was at bedside that she may lose pulse soon and she needed to call family to come to bedside. Also alerted E. Link provider Dr. Lucile Shutters, respiratory therapy, and called anesthesia to come intubate. Proactively placed defibrillator pads on patient and explained rationale to daughter. Ambu bag utilized to provide respirations until intubation. Dr. Lucile Shutters directed nursing staff on interventions to provide life support. Family verified FULL code status throughout next three hours of interventions. Patient did not lose pulse during this time. Provided emotional support to family throughout this event. Family verbalized understanding and appreciation for efforts.

## 2021-08-19 NOTE — Progress Notes (Signed)
Patient found to be in PEA with no escalation of care, DNR/ DNI per provider.  Time of death confirmed by two RN at [facility].  Family at bedside and notified.  Per provider, vent and gtts turned off.

## 2021-08-19 NOTE — Progress Notes (Addendum)
[place] Progress Note Patient Name: Geanine Vandekamp DOB: June 09, 1966 MRN: [id number]   Date of Service  August 12, 2021  HPI/Events of Note  PH 7.04 on ABG.  eICU Interventions  Sodium bicarbonate 100 meq iv x 1 followed by Bicarb gtt.        Kerry Kass Analyssa Downs 08-12-21, 5:56 AM

## 2021-08-19 NOTE — Progress Notes (Signed)
[facility]Link Physician-Brief Progress Note Patient Name: Angela Wilson DOB: [birth year] MRN: [id number]   Date of Service  2021-08-17  HPI/Events of Note  Patient with impending respiratory failure, I discussed intubation once again with patients daughter, she confirms that she wants to proceed with intubation.  eICU Interventions  Anesthesia requested to come and intubate the patient, will bolus her with 500 ml of 5 % albumin, as her blood pressure is 75/51 pre-intubation.        Kerry Kass Jarell Mcewen 08-17-21, 4:34 AM

## 2021-08-19 NOTE — Progress Notes (Addendum)
NAMEJearlene Wilson, MRN:  [id number], DOB:  Apr 29, 1966, LOS: 42 ADMISSION DATE:  06/26/2021, CONSULTATION DATE:  11/15 REFERRING MD:  Dr. 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 Her cancer has unfortunately continued to progress despite treatment. She has been inpatient since 10/27 when she was admitted with abdominal pain and weakness. Course complicated by malignant ascites and pleural effusion requiring palliative drainage. Then complicated by refractory nausea and vomiting ultimately resulting in the initiation of TPN. She has been followed by oncology, surgery, and palliative care in the addition to [facility]. Unfortunately she has been delivered a terminal prognosis by specialist involved.   11/15 she presented to IR for paracentesis and was found to be hypotensive with systolic blood pressures in the 70s. She was given albumin and IVF without a positive impact. She was transferred to the ICU for the initiation of vasopressors and PCCM was consulted.   Pertinent  Medical History  Type 2 diabetes GERD HTN HLD Pancreatic cancer Shingles   Significant Hospital Events:  10/27 admitted  11/15 transferred back to ICU for worsening AMS and hypotension  11/16 early AM intubated for inability to protect airway, progressive medical decline with impending arrest   Interim History / Subjective:  Seen lying in bed on vent with agonal respiration  She is actively dying   Objective   Blood pressure (!) 42/23, pulse 62, temperature (!) 97.5 F (36.4 C), temperature source Axillary, resp. rate (!) 24, height 5\' 6"  (1.676 m), weight 99 kg, SpO2 94 %.    Vent Mode: PRVC FiO2 (%):  [100 %] 100 % Set Rate:  [18 bmp] 18 bmp Vt Set:  [410 mL-470 mL] 470 mL PEEP:  [5 cmH20] 5 cmH20 Plateau Pressure:  [23 cmH20-26 cmH20] 26 cmH20    Intake/Output Summary (Last 24 hours) at August 13, 2021 3086 Last data filed at 2021-08-13 0000 Gross per 24 hour  Intake 1635.26 ml  Output 150 ml  Net 1485.26 ml    Filed Weights   08/01/21 0500 08/03/21 1000 08/03/21 1628  Weight: 92.5 kg 101.2 kg 99 kg    Examination: General: Acute on chronically ill appearing elderly female lying in bed on mechanical ventilation, in NAD HEENT: ETT, MM pink/moist, PERRL,  Neuro: Unresponsive  CV: s1s2 regular rate and rhythm, no murmur, rubs, or gallops,  PULM:  Agonal breath with vent in place GI: soft, bowel sounds hypoactive in all 4 quadrants, non-tender, distended Extremities: warm/dry, no edema  Skin: no rashes or lesions  Resolved Hospital Problem list     Assessment & Plan:   Stage IV pancreatic adenocarcinoma  -Complicated by peritoneal carcinomatosis: s/p multiple rounds of chemotherapy and radiation. No longer a candidate for further therapy. Complicated by malignant ascites and pleural effusion.  Shock -Etiology uncertain. Could be hypovolemic in the setting of hypoalbuminemia/ third spacing or ongoing diarrhea. As Dr. Grandville Silos has speculated perhaps there is a mass or ascites compressing the IVC Failure to thrive Malnutrition Right IJ thrombus Colonic ileus Goals of care  Discussion On am assessment patient is actively dying with family at bedside. She has had progressive advancement in care to include intubation at 4am, progressive uptitration of pressors, and multiple pushes of Epi for ongoing medical code. These interventions have prolonged her life but despite these interventions patient is dying from her incurable metastatic cancer and significant comorbidities.   The  above information was relayed to all family members at bedside. They all verbalize understanding. It was then dicussed the futility of chest compression and/or defibrillation. They again verbalized understanding but wish to continue with all current  interventions.   At this time no additional care will be beneficial and will only harm the patient in the long term. Therefore, at this time we will no oscillate care any further.   Plan  Continue vent at current setting, no escalation or repeat ABG Continue current pressors with no further uptitration or additional pressors  No further Epi pushes No bicar dip  No lab sticks  No chest compressions or shocks  Unrestricted visitation  Support family in any way they need Compassion cart   Best Practice (right click and "Reselect all SmartList Selections" daily)   Diet/type: NPO DVT prophylaxis: not indicated GI prophylaxis: PPI Lines: Central line Foley:  N/A Code Status:  full code Last date of multidisciplinary goals of care discussion [ ]     Critical care time:    Performed by: Namari Breton D. Harris  Total critical care time: 40 minutes  Critical care time was exclusive of separately billable procedures and treating other patients.  Critical care was necessary to treat or prevent imminent or life-threatening deterioration.  Critical care was time spent personally by me on the following activities: development of treatment plan with patient and/or surrogate as well as nursing, discussions with consultants, evaluation of patient's response to treatment, examination of patient, obtaining history from patient or surrogate, ordering and performing treatments and interventions, ordering and review of laboratory studies, ordering and review of radiographic studies, pulse oximetry and re-evaluation of patient's condition.  Tanecia Mccay D. Kenton Kingfisher, NP-C [facility] Personal contact information can be found on Amion  14-Aug-2021, 7:34 AM

## 2021-08-19 NOTE — Progress Notes (Signed)
OT Cancellation Note  Patient Details Name: Angela Wilson MRN: [id number] DOB: June 18, 1966   Cancelled Treatment:    Reason Eval/Treat Not Completed: Medical issues which prohibited therapy. Patient has been intubated. OT will sign off. Re-order when patient extubated and appropriate for therapy.  Chanan Detwiler L Ubah Radke 08-23-21, 6:59 AM

## 2021-08-19 NOTE — Progress Notes (Signed)
Physical Therapy Discharge Patient Details Name: Angela Wilson MRN: [id number] DOB: [birth year] Today's Date: Aug 16, 2021 Time:  -     Patient discharged from PT services secondary to medical decline - will need to re-order PT to resume therapy services. Patient now on ventilator.  Please see latest therapy progress note for current level of functioning and progress toward goals.    Progress and discharge plan discussed with patient and/or caregiver: Patient unable to participate in discharge planning and no caregivers available  GP     Claretha Cooper Aug 16, 2021, 6:58 AM   [place] Solis Pager [phone] Office [phone]

## 2021-08-19 DEATH — deceased

## 2021-08-31 ENCOUNTER — Other Ambulatory Visit (HOSPITAL_BASED_OUTPATIENT_CLINIC_OR_DEPARTMENT_OTHER): Payer: Self-pay

## 2021-09-20 ENCOUNTER — Other Ambulatory Visit: Payer: Self-pay

## 2022-03-26 IMAGING — CT CT CHEST W/O CM
2 of 4 series · 13 of 36 positions shown, 16 images · non-contrast
Comparison: 02/13/2020

CLINICAL DATA: Metastatic pancreatic cancer, assess treatment
response

EXAM:
CT CHEST, ABDOMEN AND PELVIS WITHOUT CONTRAST
TECHNIQUE: Multidetector CT imaging of the chest, abdomen and pelvis was
performed following the standard protocol without IV contrast.

[Series 2: axial st · axial · 0.93mm/px · z∈[-644,-94]mm · 10 of 133 slices shown, 13 images]
[im 12/133  mediastinal]
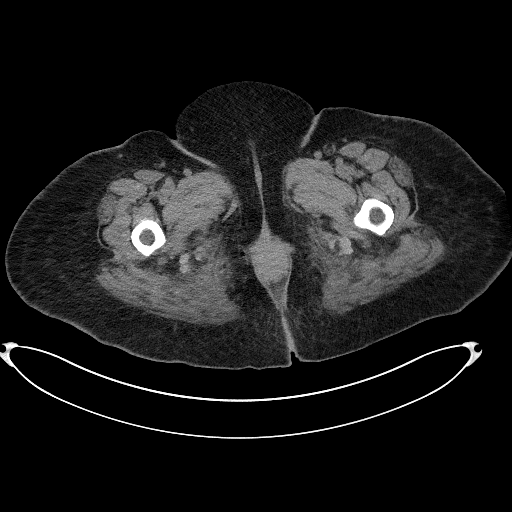
[im 12/133  lung]
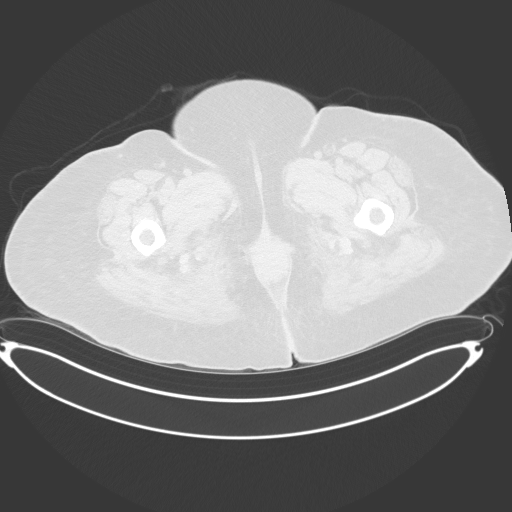
[im 23/133  lung]
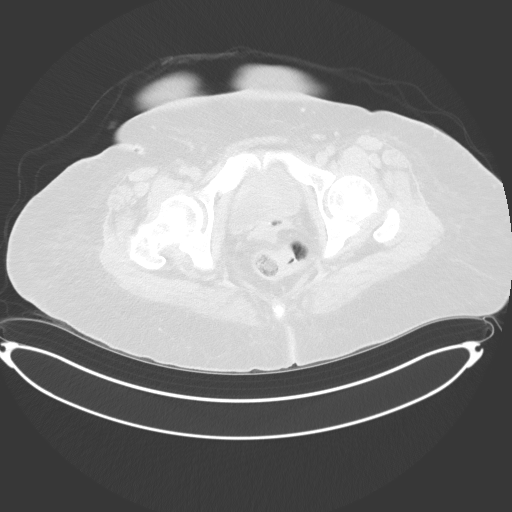
[im 34/133  lung]
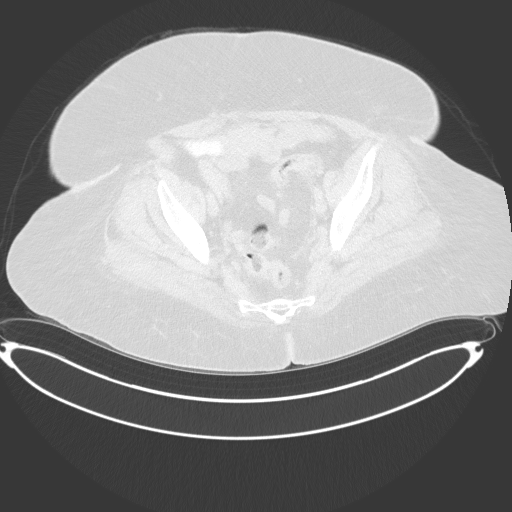
[im 45/133  lung]
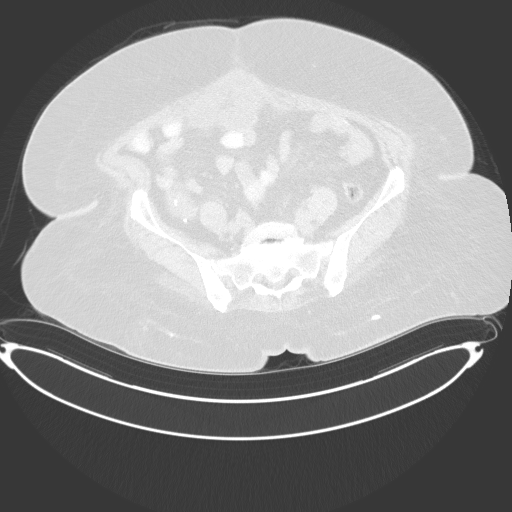
[im 56/133  mediastinal]
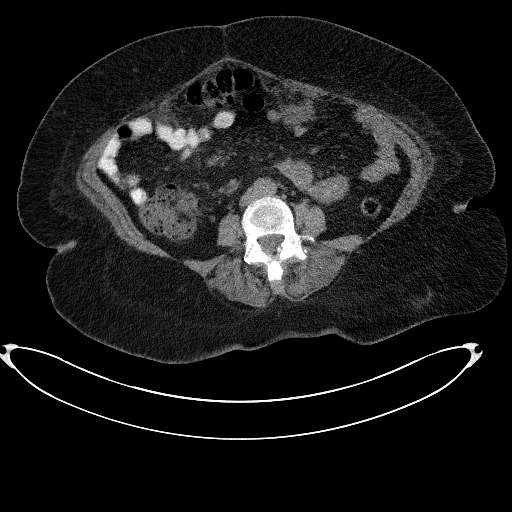
[im 56/133  lung]
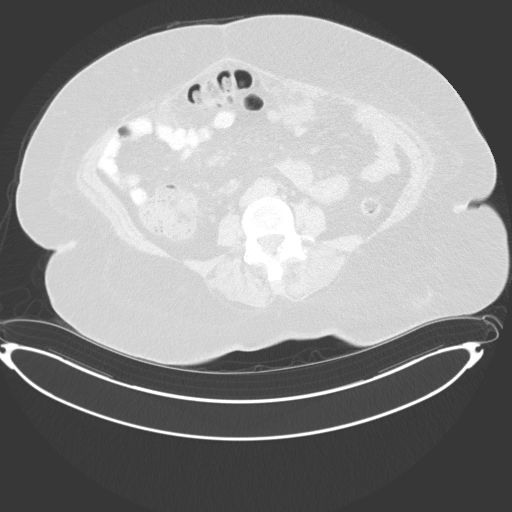
[im 78/133  lung]
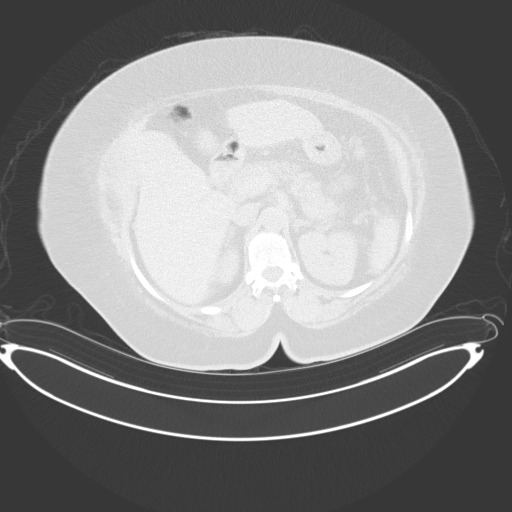
[im 89/133  lung]
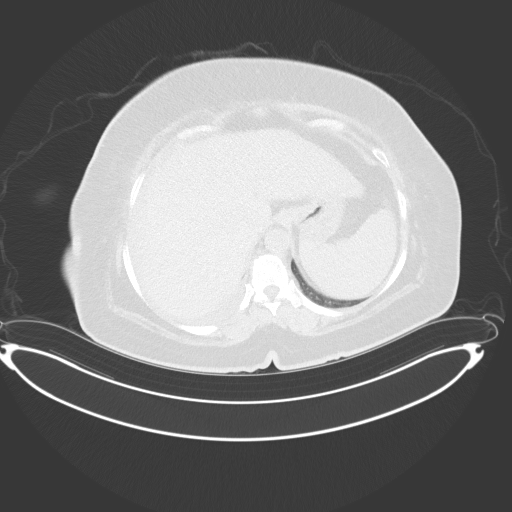
[im 100/133  lung]
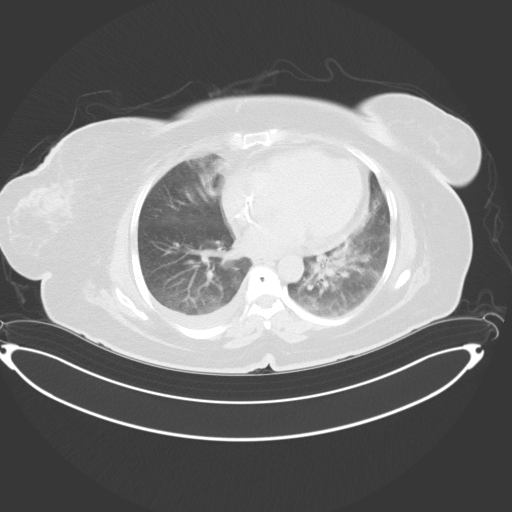
[im 111/133  mediastinal]
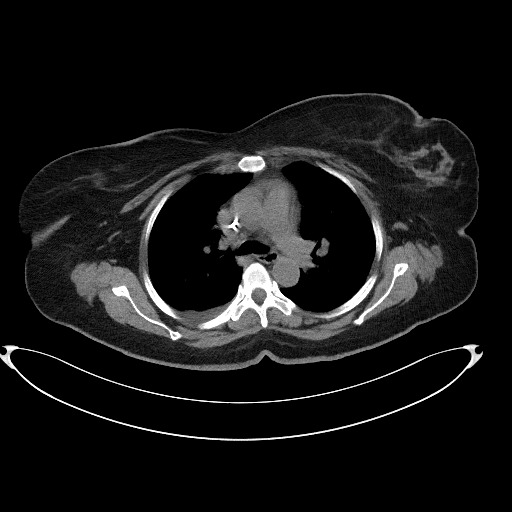
[im 111/133  lung]
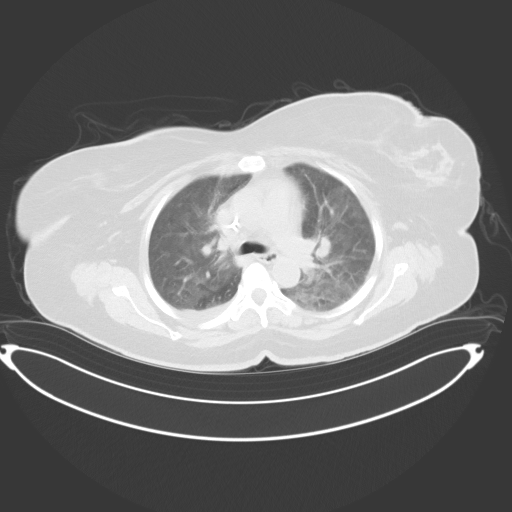
[im 122/133  lung]
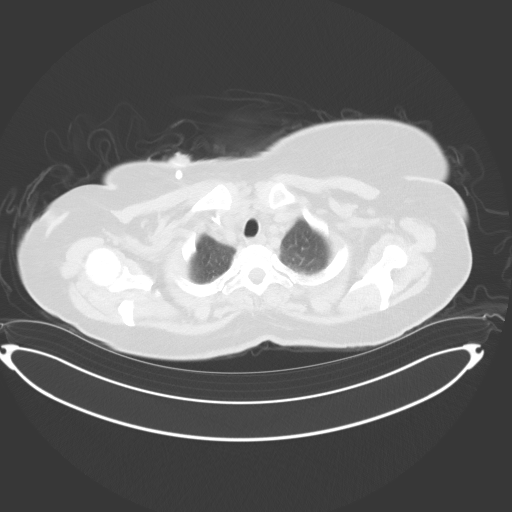

[Series 4: coronal · coronal · 0.99mm/px · 3 of 181 slices shown]
[im 37/181  lung]
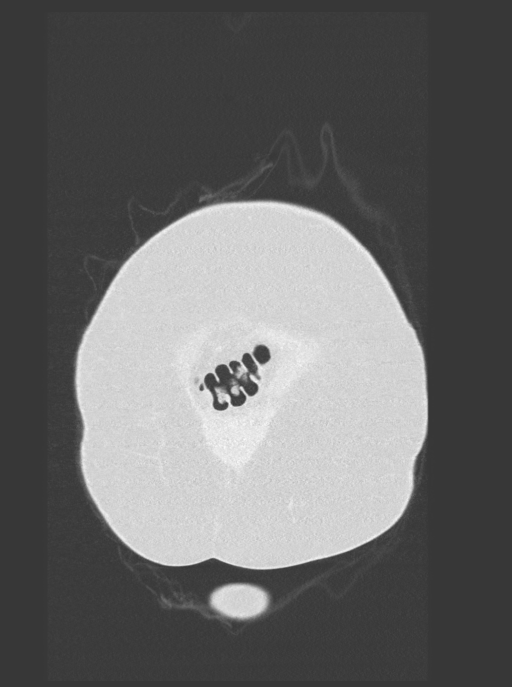
[im 73/181  lung]
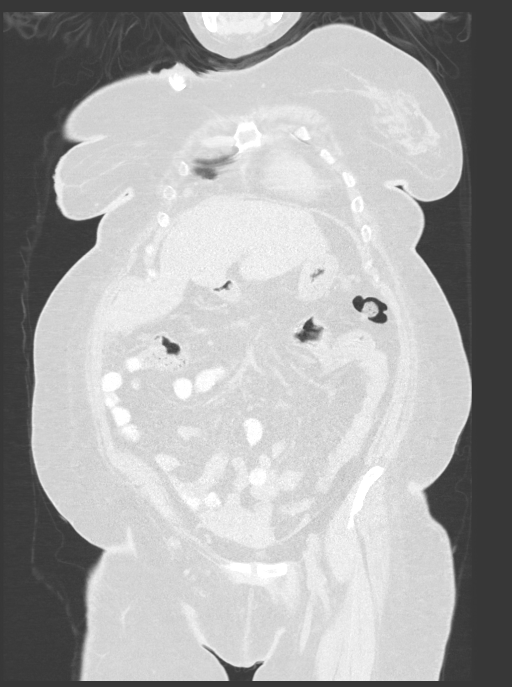
[im 109/181  lung]
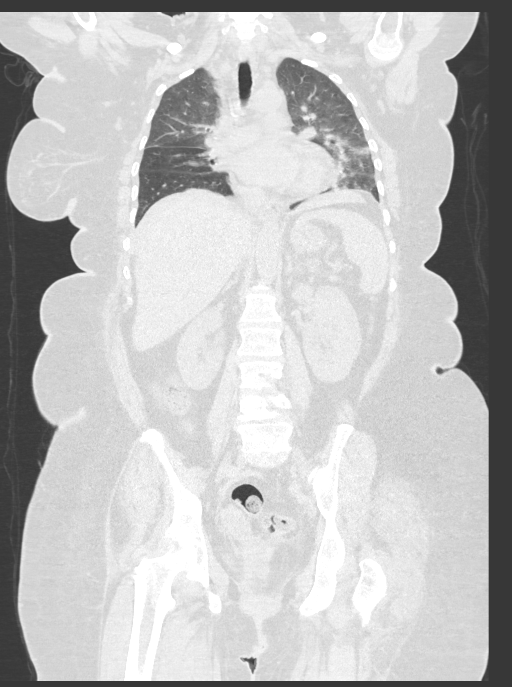

[13 of 36 positions shown; findings below may reference images not displayed]

FINDINGS: CT CHEST FINDINGS

Cardiovascular: Right chest port catheter normal heart size. No
pericardial effusion.

Mediastinum/Nodes: No enlarged mediastinal, hilar, or axillary lymph
nodes. Thyroid gland, trachea, and esophagus demonstrate no
significant findings.

Lungs/Pleura: Small right pleural effusion associated atelectasis or
consolidation, decreased compared to prior examination. Scattered
ground-glass attenuation of the airspaces throughout the lungs.
Small nonspecific nodules of the lung apices are unchanged, a 4 mm
nodule of the right pulmonary apex (series 6, image 42) and a 4 mm
nodule of the left pulmonary apex (series 6, image 33).

Musculoskeletal: No chest wall mass or suspicious bone lesions
identified.

CT ABDOMEN PELVIS FINDINGS

Hepatobiliary: No solid liver abnormality is seen. No gallstones,
gallbladder wall thickening, or biliary dilatation.

Pancreas: Redemonstrated soft tissue mass of the pancreatic tail
measuring approximately 4.2 x 3.3 cm, not significantly changed
compared to prior examination (series 2, image 58). No pancreatic
ductal dilatation or surrounding inflammatory changes.

Spleen: Normal in size without significant abnormality.

Adrenals/Urinary Tract: Adrenal glands are unremarkable. Tiny
bilateral nonobstructive renal calculi. No hydronephrosis. Bladder
is unremarkable.

Stomach/Bowel: Stomach is within normal limits. Appendix appears
normal. No evidence of bowel wall thickening, distention, or
inflammatory changes.

Vascular/Lymphatic: No significant vascular findings are present. No
enlarged abdominal or pelvic lymph nodes.

Reproductive: No mass or other abnormality.

Other: No abdominal wall hernia or abnormality. Interval resolution
of previously seen ascites. No significant change in confluent
omental and/or peritoneal soft tissue in the ventral abdomen, a
discrete index nodule measuring 2.8 x 2.5 cm (series 2, image 80).
There has been a significant reduction in nodularity noted about the
pericolic gutters (e.g. Series 2, image 84).

Musculoskeletal: No acute or significant osseous findings. Bilateral
pars defects of L4 and L5 with degenerative anterolisthesis of L4 on
L5.
IMPRESSION: 1. Redemonstrated soft tissue mass of the pancreatic tail measuring
approximately 4.2 x 3.3 cm, not significantly changed compared to
prior examination.
2. No significant change in the dominant mass of confluent omental
and/or peritoneal soft tissue in the ventral abdomen.
3. There has been a significant reduction in nodularity noted about
the pericolic gutters with interval resolution of previously seen
ascites.
4. Findings above are consistent with some treatment response of
omental and peritoneal metastatic disease.
5. Small right pleural effusion and associated atelectasis or
consolidation, decreased compared to prior examination, presumed
malignant although without pleural disease discretely appreciated on
noncontrast CT.
6. Unchanged nonspecific 4 mm pulmonary nodules of the bilateral
lung apices. Attention on follow-up.
7. Scattered ground-glass attenuation of the airspaces throughout
the lungs, nonspecific and perhaps related to small airways disease.
8. No evidence of lymphadenopathy in the chest, abdomen, or pelvis.
9. No evidence of solid organ metastatic disease; please note that
assessment is quite limited on noncontrast CT.
10. Nonobstructive bilateral nephrolithiasis.

## 2022-06-05 IMAGING — CT CT CHEST W/O CM
2 of 4 series · 14 of 36 positions shown, 17 images · non-contrast
Comparison: 04/21/2020

CLINICAL DATA: Pancreatic cancer.

EXAM:
CT CHEST, ABDOMEN AND PELVIS WITHOUT CONTRAST
TECHNIQUE: Multidetector CT imaging of the chest, abdomen and pelvis was
performed following the standard protocol without IV contrast.

[Series 2: axial st · axial · 0.98mm/px · z∈[-686,-146]mm · 11 of 128 slices shown, 14 images]
[im 10/128  mediastinal]
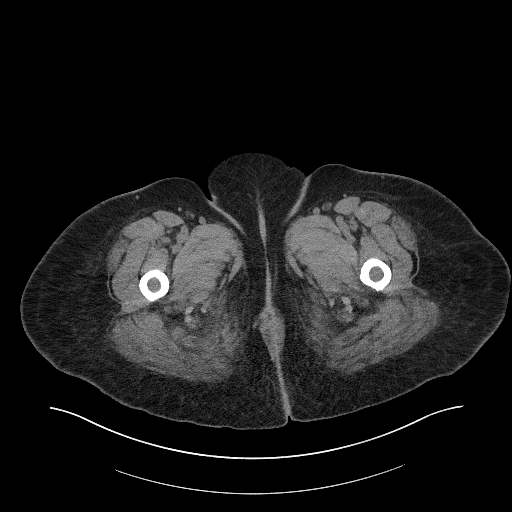
[im 10/128  lung]
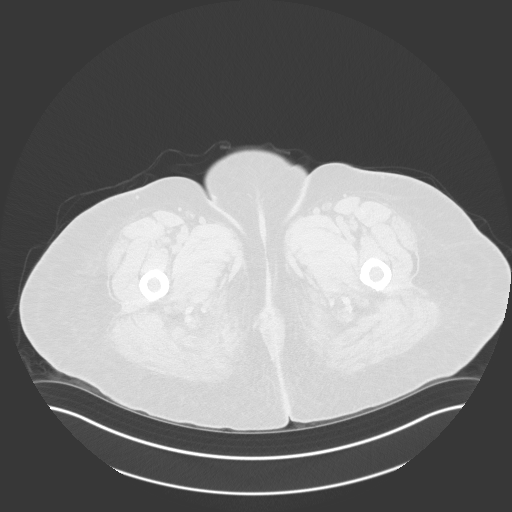
[im 20/128  lung]
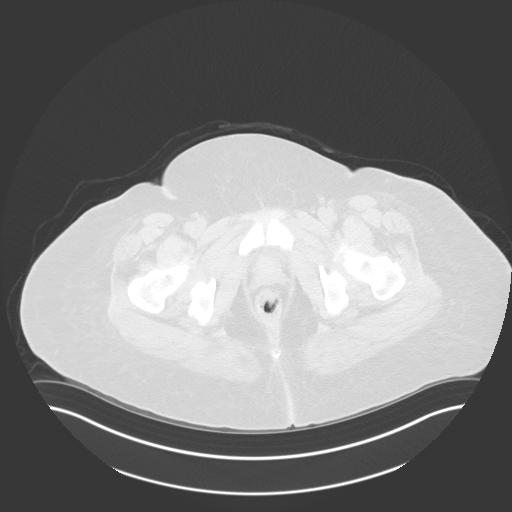
[im 30/128  lung]
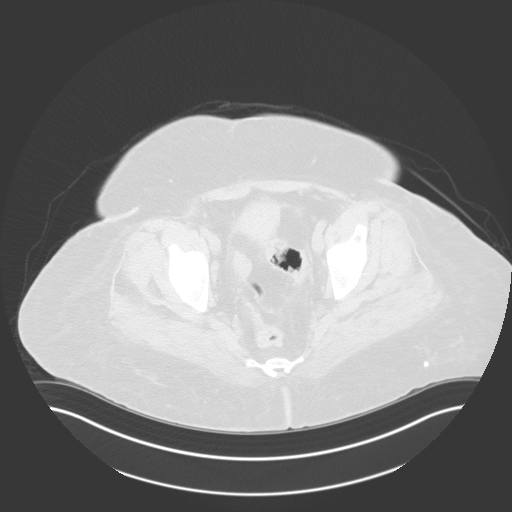
[im 40/128  lung]
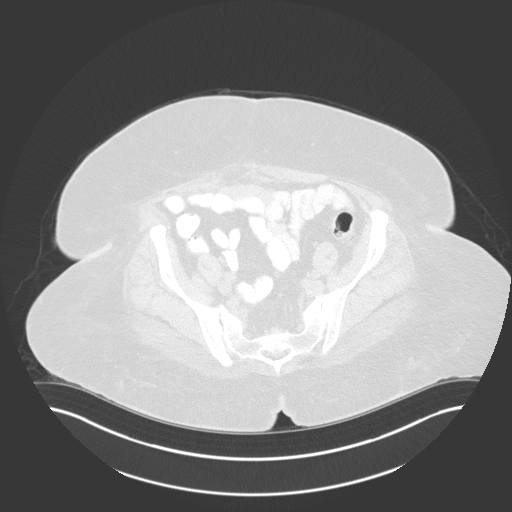
[im 49/128  mediastinal]
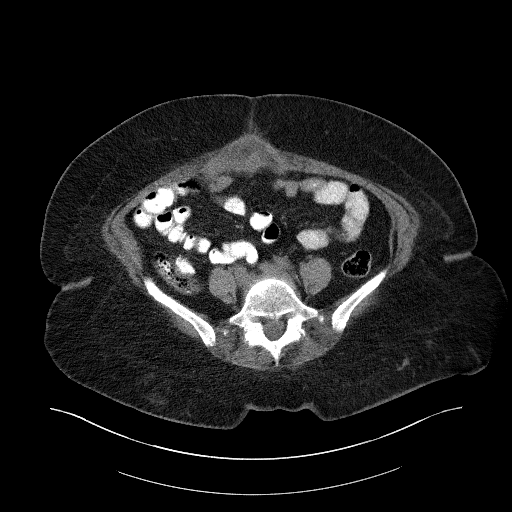
[im 49/128  lung]
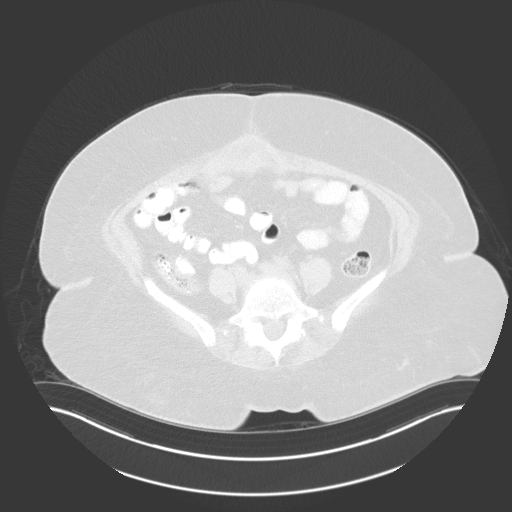
[im 69/128  lung]
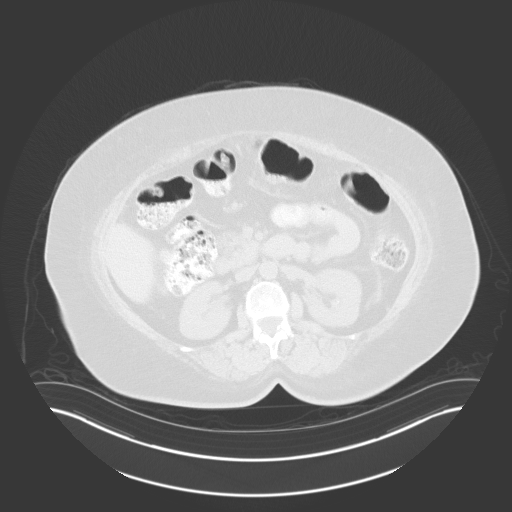
[im 79/128  lung]
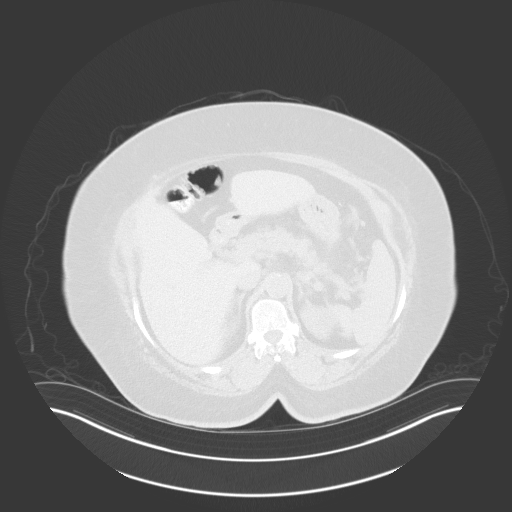
[im 88/128  lung]
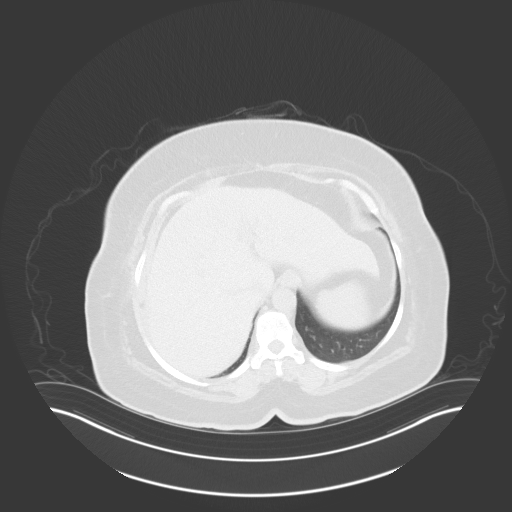
[im 98/128  mediastinal]
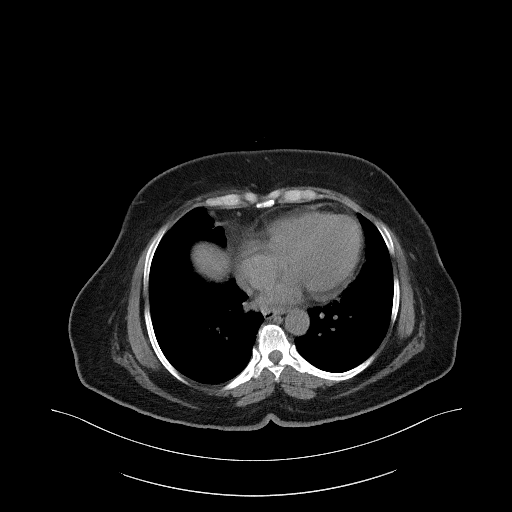
[im 98/128  lung]
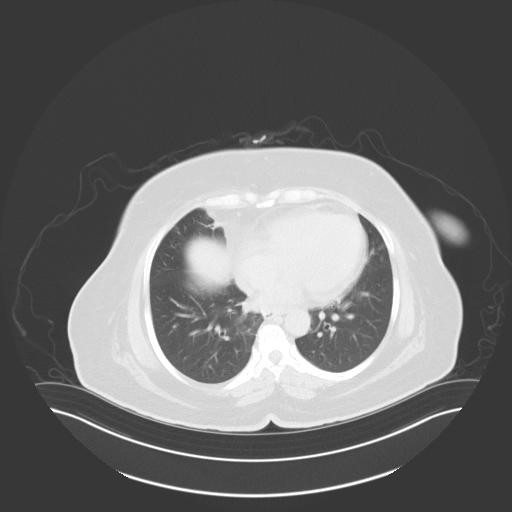
[im 108/128  lung]
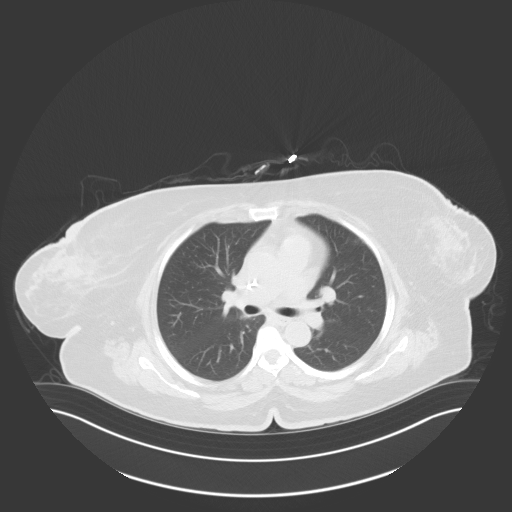
[im 118/128  lung]
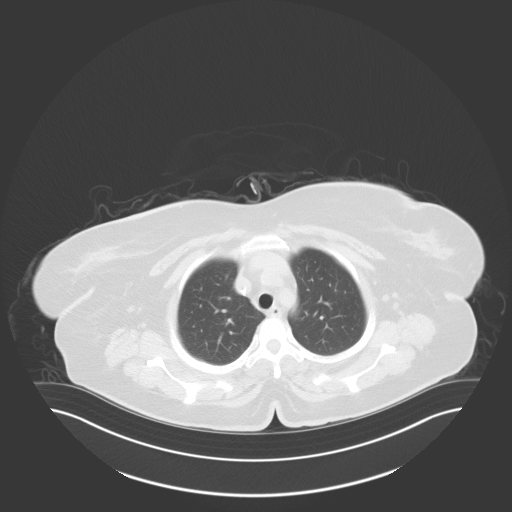

[Series 5: coronal · coronal · 0.96mm/px · 3 of 173 slices shown]
[im 35/173  lung]
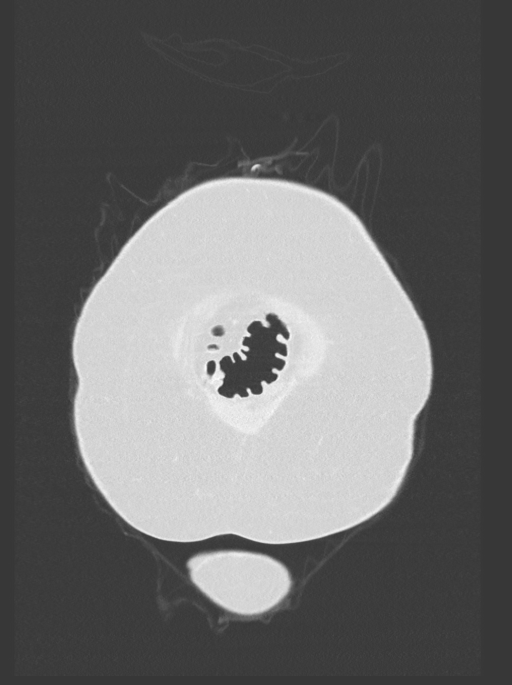
[im 69/173  lung]
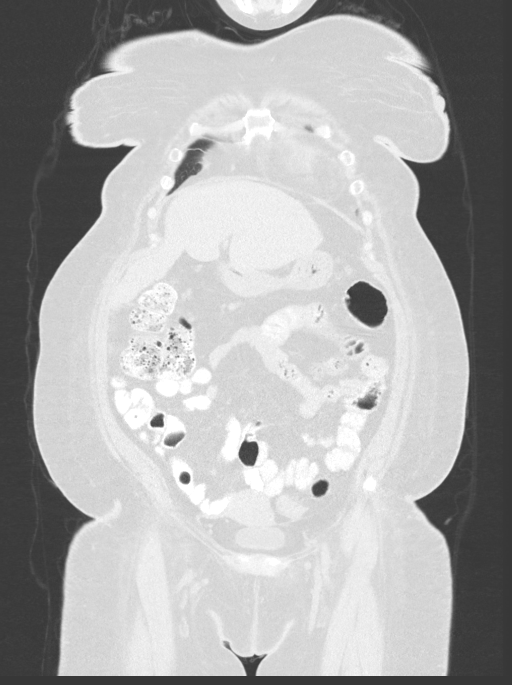
[im 104/173  lung]
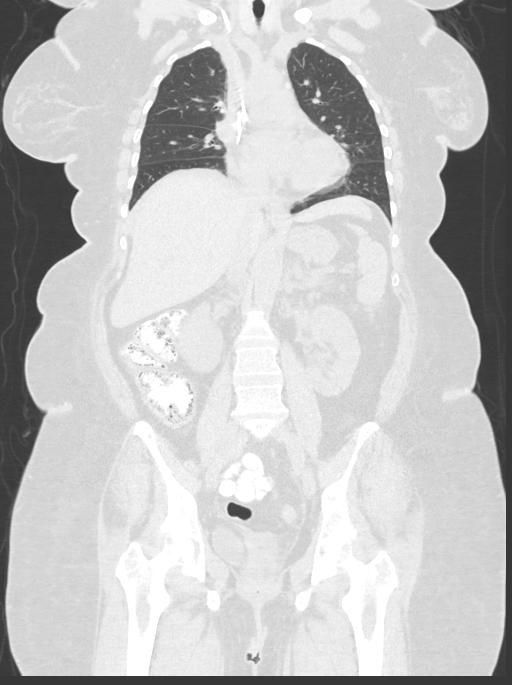

[14 of 36 positions shown; findings below may reference images not displayed]

FINDINGS: CT CHEST FINDINGS

Cardiovascular: Mild cardiac enlargement. No pericardial effusion.
Aortic atherosclerosis.

Mediastinum/Nodes: Normal appearance of the thyroid gland. The
trachea appears patent and is midline. Normal appearance of the
esophagus. No mediastinal, axillary, supraclavicular or hilar
adenopathy.

Lungs/Pleura: No pleural effusion, airspace consolidation, or
atelectasis. Mild scarring noted within the lingula and right middle
lobe. Previously described bilateral ground-glass opacities have
resolved in the interval compatible with an inflammatory or
infectious process.

Musculoskeletal: Spondylosis identified within the thoracic spine.

CT ABDOMEN PELVIS FINDINGS

Hepatobiliary: There is no focal liver abnormality identified. The
gallbladder appears surgically absent. No biliary dilatation.

Pancreas: Evaluation of tail of pancreas lesion is significantly
diminished due to lack of IV contrast material. The margins of this
lesion cannot be reliably identified. Best estimate is that the
lesion approximately 3.6 x 2.4 cm, image 53/2. Previously 4.5 x
cm.

Spleen: Spleen is unremarkable.

Adrenals/Urinary Tract: Normal appearance of the adrenal glands.

Small left renal calculi. Hypodense lesion within the upper pole of
the left kidney is suspected measuring approximately 2.1 cm.
Incompletely characterized without IV contrast. No hydronephrosis
bilaterally. Urinary bladder is unremarkable.

Stomach/Bowel: Stomach is normal. The small bowel loops are
nondilated. Unremarkable appearance of the colon.

Vascular/Lymphatic: Normal caliber of the abdominal aorta. No
abdominopelvic adenopathy.

Reproductive: Uterus and bilateral adnexa are unremarkable.

Other: No ascites or focal fluid collections identified. Confluent
peritoneal soft tissue within the ventral abdomen is again
identified compatible with peritoneal carcinomatosis. This measures
approximately 7.5 x 3.5 cm, image 78/2. Previously this measured the
same

Musculoskeletal: No acute or significant osseous findings. No
suspicious or acute bone lesions. There are bilateral L4-5 and L5-S1
pars defects. Degenerative disc disease with anterolisthesis of L4
on L5 is noted. There is also degenerative disc disease noted at
L5-S1.
IMPRESSION: 1. Evaluation of tail of pancreas lesion is significantly diminished
due to lack of IV contrast material. The margins of this lesion
cannot be reliably identified. Best estimate this is slightly
decreased in size from previous exam. Consider future follow-up
imaging with PET-CT or MRI without and with contrast material if the
patient is unable to receive intravenous CT contrast material
2. Similar appearance of peritoneal carcinomatosis.
3. Aortic atherosclerosis.
4. Nonobstructing left renal calculi.
5. Bilateral L4-5 and L5-S1 pars defects with degenerative disc
disease and anterolisthesis of L4 on L5.

Aortic Atherosclerosis ([id number]-IRA.A).

## 2023-03-25 IMAGING — US US EXTREM  UP VENOUS*R*
1 series · 13 of 24 positions shown · non-contrast
Comparison: 01/27/2021

CLINICAL DATA: Right neck pain, metastatic pancreatic cancer



[Series 1: us extrem up venous*right* · 13 of 42 slices shown]
[im 1/42]
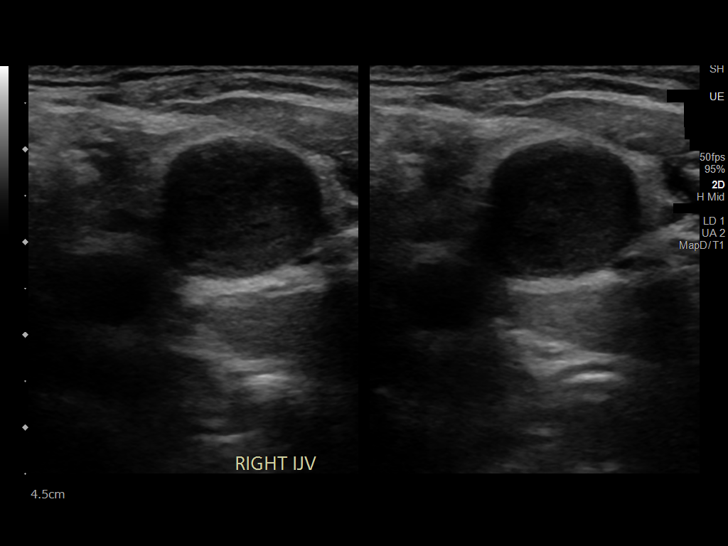
[im 4/42]
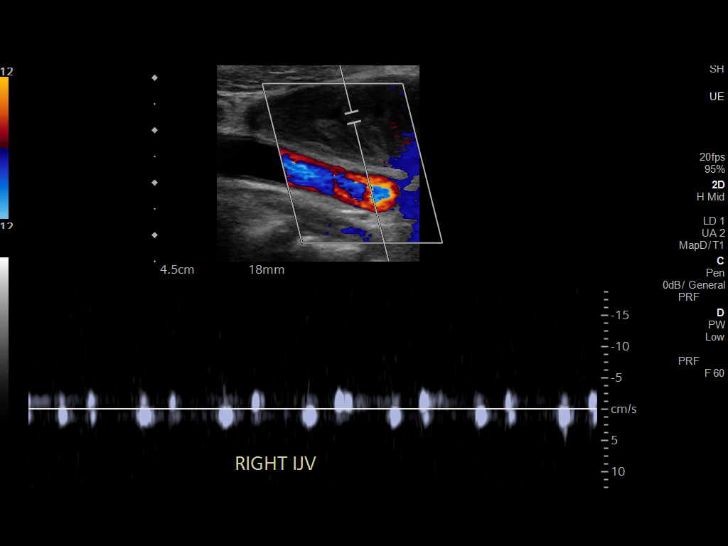
[im 8/42]
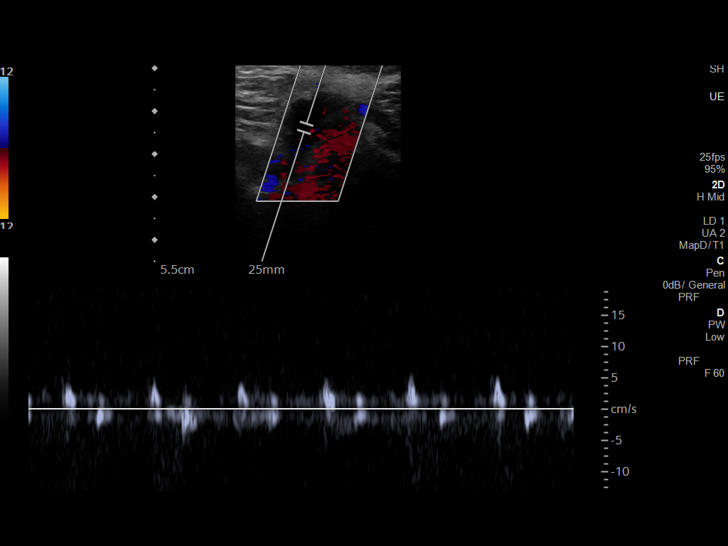
[im 11/42]
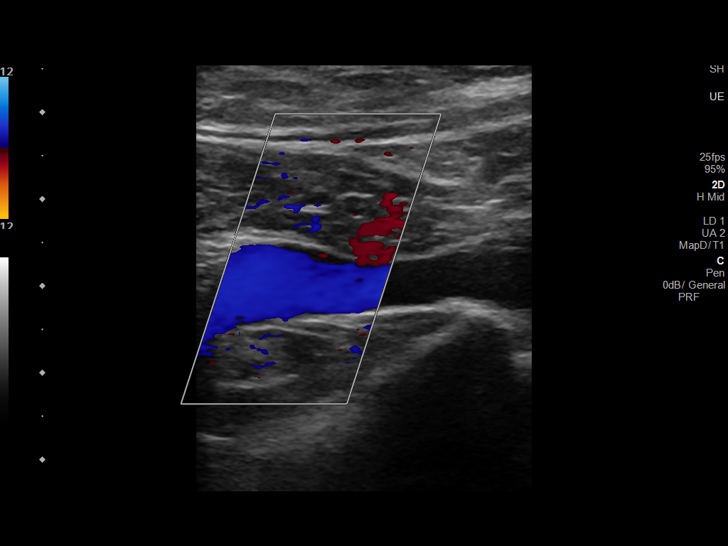
[im 15/42]
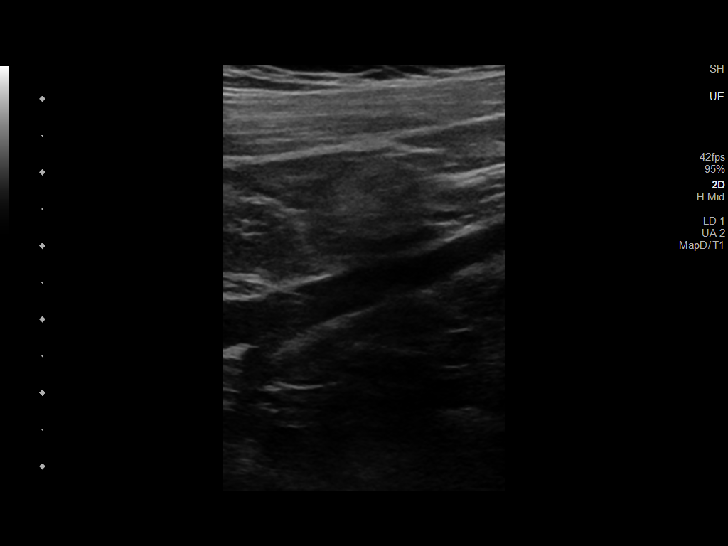
[im 18/42]
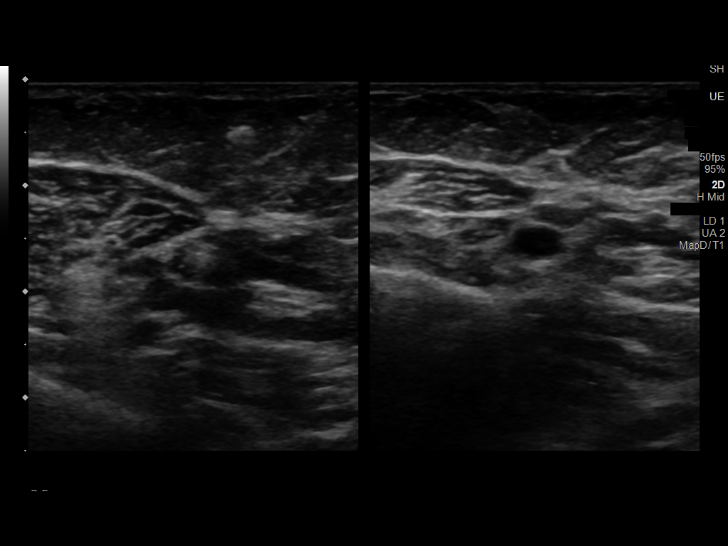
[im 22/42]
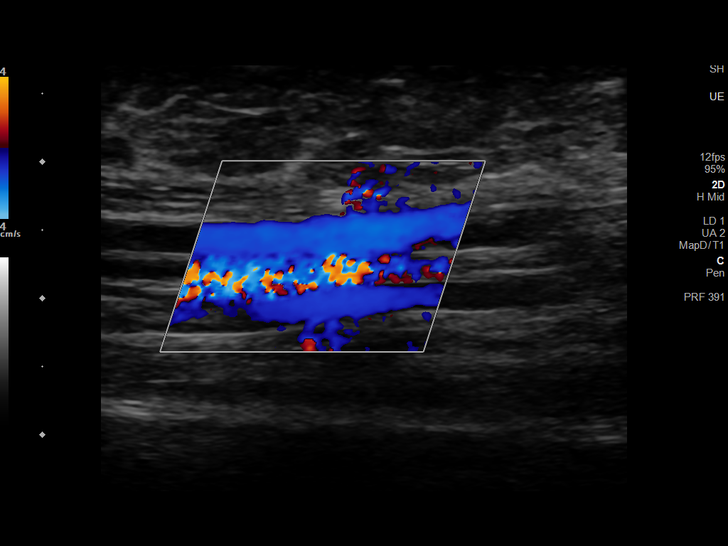
[im 24/42]
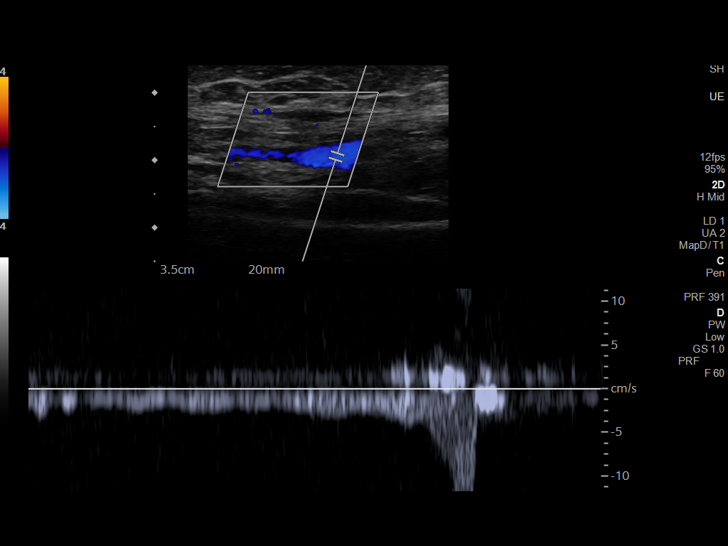
[im 27/42]
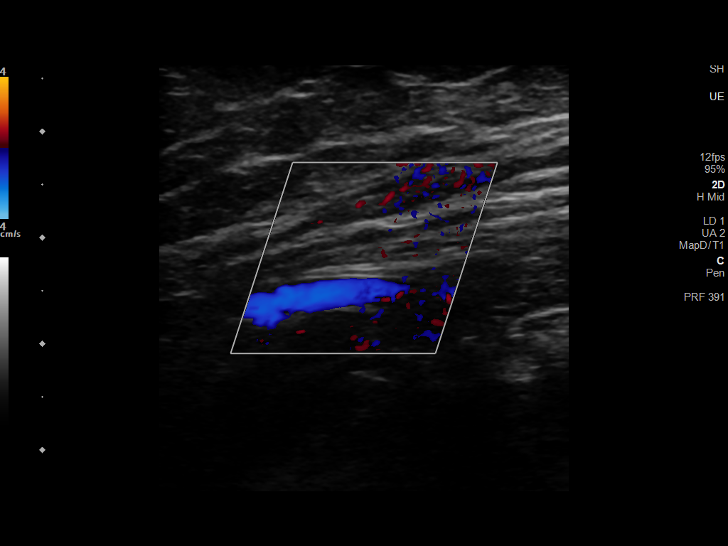
[im 31/42]
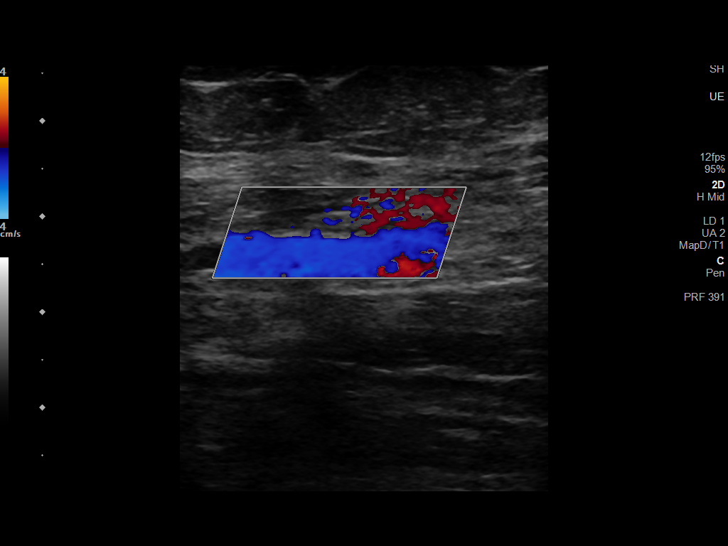
[im 34/42]
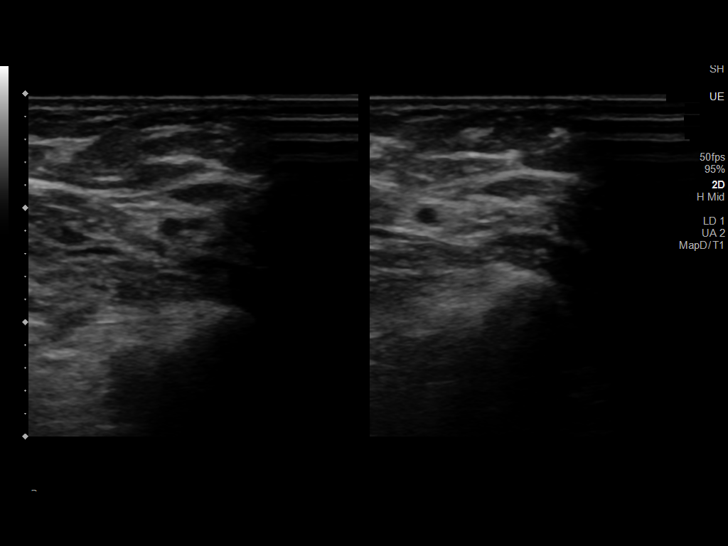
[im 38/42]
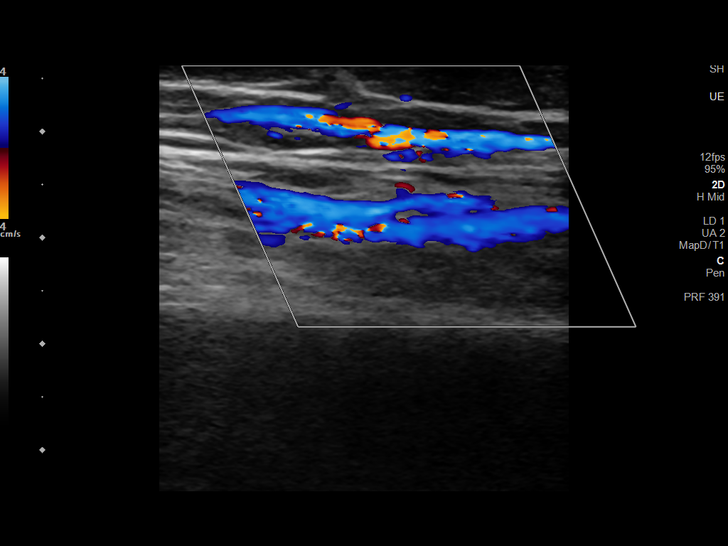
[im 42/42]
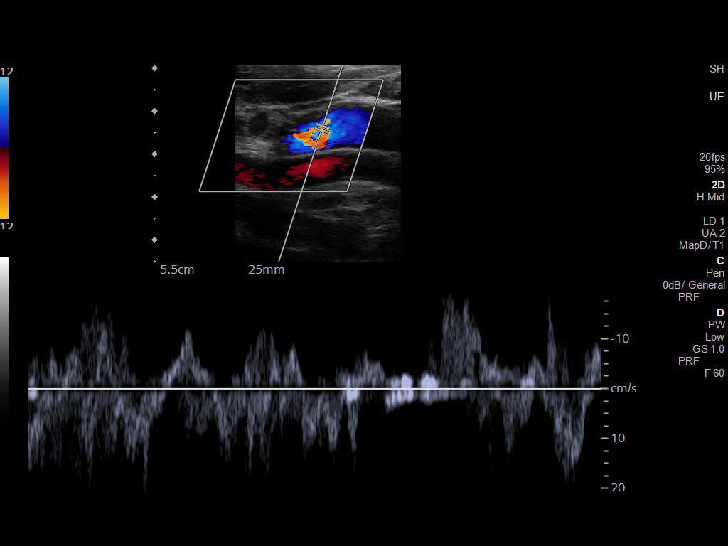

[13 of 24 positions shown; findings below may reference images not displayed]

FINDINGS: Contralateral Subclavian Vein: Respiratory phasicity is normal and
symmetric with the symptomatic side. No evidence of thrombus. Normal
compressibility.

Internal Jugular Vein: There is occlusive thrombus within the right
internal jugular vein, extending into the right innominate vein.

Subclavian Vein: No evidence of thrombus. Normal compressibility,
respiratory phasicity and response to augmentation.

Axillary Vein: No evidence of thrombus. Normal compressibility,
respiratory phasicity and response to augmentation.

Cephalic Vein: No evidence of thrombus. Normal compressibility,
respiratory phasicity and response to augmentation.

Basilic Vein: No evidence of thrombus. Normal compressibility,
respiratory phasicity and response to augmentation.

Brachial Veins: No evidence of thrombus. Normal compressibility,
respiratory phasicity and response to augmentation.

Radial Veins: No evidence of thrombus. Normal compressibility,
respiratory phasicity and response to augmentation.

Ulnar Veins: No evidence of thrombus. Normal compressibility,
respiratory phasicity and response to augmentation.
IMPRESSION: 1. Occlusive thrombus of the right internal jugular vein, extending
into the right innominate vein. There is an indwelling right chest
wall port via internal jugular approach.

These results will be called to the ordering clinician or
representative by the Radiologist Assistant, and communication
documented in the PACS or [REDACTED].
